# Patient Record
Sex: Male | Born: 1950
Health system: Southern US, Community
[De-identification: ages and names within clinical notes are randomized; demographics above are authoritative.]

## PROBLEM LIST (undated history)

## (undated) DIAGNOSIS — I739 Peripheral vascular disease, unspecified: Secondary | ICD-10-CM

## (undated) DIAGNOSIS — E785 Hyperlipidemia, unspecified: Secondary | ICD-10-CM

## (undated) DIAGNOSIS — E875 Hyperkalemia: Principal | ICD-10-CM

## (undated) DIAGNOSIS — Z1159 Encounter for screening for other viral diseases: Secondary | ICD-10-CM

## (undated) DIAGNOSIS — I10 Essential (primary) hypertension: Secondary | ICD-10-CM

## (undated) DIAGNOSIS — M109 Gout, unspecified: Secondary | ICD-10-CM

## (undated) DIAGNOSIS — I350 Nonrheumatic aortic (valve) stenosis: Secondary | ICD-10-CM

## (undated) DIAGNOSIS — M549 Dorsalgia, unspecified: Secondary | ICD-10-CM

## (undated) DIAGNOSIS — Z952 Presence of prosthetic heart valve: Secondary | ICD-10-CM

## (undated) DIAGNOSIS — I779 Disorder of arteries and arterioles, unspecified: Secondary | ICD-10-CM

## (undated) DIAGNOSIS — I5042 Chronic combined systolic (congestive) and diastolic (congestive) heart failure: Secondary | ICD-10-CM

## (undated) DIAGNOSIS — I4891 Unspecified atrial fibrillation: Secondary | ICD-10-CM

## (undated) DIAGNOSIS — I639 Cerebral infarction, unspecified: Secondary | ICD-10-CM

## (undated) DIAGNOSIS — J449 Chronic obstructive pulmonary disease, unspecified: Secondary | ICD-10-CM

## (undated) DIAGNOSIS — Z720 Tobacco use: Secondary | ICD-10-CM

## (undated) DIAGNOSIS — R011 Cardiac murmur, unspecified: Secondary | ICD-10-CM

## (undated) DIAGNOSIS — I2510 Atherosclerotic heart disease of native coronary artery without angina pectoris: Secondary | ICD-10-CM

## (undated) HISTORY — DX: Hyperkalemia: E87.5

## (undated) HISTORY — DX: Encounter for screening for other viral diseases: Z11.59

## (undated) HISTORY — DX: Presence of prosthetic heart valve: Z95.2

## (undated) HISTORY — DX: Peripheral vascular disease, unspecified: I73.9

## (undated) HISTORY — DX: Disorder of arteries and arterioles, unspecified: I77.9

## (undated) HISTORY — DX: Cerebral infarction, unspecified: I63.9

---

## 2002-12-04 ENCOUNTER — Encounter: Admission: RE | Admit: 2002-12-04 | Discharge: 2002-12-04 | Payer: Self-pay | Admitting: Orthopedic Surgery

## 2002-12-04 ENCOUNTER — Encounter: Payer: Self-pay | Admitting: Orthopedic Surgery

## 2002-12-04 ENCOUNTER — Encounter: Payer: Self-pay | Admitting: Radiology

## 2002-12-19 ENCOUNTER — Encounter: Payer: Self-pay | Admitting: Orthopedic Surgery

## 2002-12-19 ENCOUNTER — Encounter: Admission: RE | Admit: 2002-12-19 | Discharge: 2002-12-19 | Payer: Self-pay | Admitting: Orthopedic Surgery

## 2003-01-02 ENCOUNTER — Encounter: Payer: Self-pay | Admitting: Orthopedic Surgery

## 2003-01-02 ENCOUNTER — Encounter: Admission: RE | Admit: 2003-01-02 | Discharge: 2003-01-02 | Payer: Self-pay | Admitting: Orthopedic Surgery

## 2008-08-25 HISTORY — PX: NM MYOCAR PERF WALL MOTION: HXRAD629

## 2008-09-23 ENCOUNTER — Ambulatory Visit (HOSPITAL_COMMUNITY): Admission: RE | Admit: 2008-09-23 | Discharge: 2008-09-23 | Payer: Self-pay | Admitting: Cardiovascular Disease

## 2008-10-14 ENCOUNTER — Encounter: Admission: RE | Admit: 2008-10-14 | Discharge: 2008-10-14 | Payer: Self-pay | Admitting: Cardiovascular Disease

## 2008-10-20 ENCOUNTER — Ambulatory Visit (HOSPITAL_COMMUNITY): Admission: AD | Admit: 2008-10-20 | Discharge: 2008-10-21 | Payer: Self-pay | Admitting: Cardiovascular Disease

## 2008-10-20 HISTORY — PX: ILIAC ARTERY STENT: SHX1786

## 2008-12-07 ENCOUNTER — Ambulatory Visit: Payer: Self-pay | Admitting: Surgery

## 2008-12-18 ENCOUNTER — Ambulatory Visit: Payer: Self-pay | Admitting: Surgery

## 2008-12-18 ENCOUNTER — Inpatient Hospital Stay (HOSPITAL_COMMUNITY): Admission: RE | Admit: 2008-12-18 | Discharge: 2008-12-25 | Payer: Self-pay | Admitting: Surgery

## 2008-12-18 ENCOUNTER — Encounter: Payer: Self-pay | Admitting: Surgery

## 2008-12-18 HISTORY — PX: OTHER SURGICAL HISTORY: SHX169

## 2009-01-11 ENCOUNTER — Ambulatory Visit: Payer: Self-pay | Admitting: Surgery

## 2010-07-30 LAB — COMPREHENSIVE METABOLIC PANEL
ALT: 26 U/L (ref 0–53)
ALT: 40 U/L (ref 0–53)
ALT: 51 U/L (ref 0–53)
AST: 26 U/L (ref 0–37)
AST: 74 U/L — ABNORMAL HIGH (ref 0–37)
Albumin: 2.7 g/dL — ABNORMAL LOW (ref 3.5–5.2)
Alkaline Phosphatase: 40 U/L (ref 39–117)
CO2: 23 mEq/L (ref 19–32)
CO2: 30 mEq/L (ref 19–32)
Calcium: 7.7 mg/dL — ABNORMAL LOW (ref 8.4–10.5)
Calcium: 8.1 mg/dL — ABNORMAL LOW (ref 8.4–10.5)
Chloride: 107 mEq/L (ref 96–112)
GFR calc Af Amer: >60 mL/min (ref >60)
GFR calc Af Amer: >60 mL/min (ref >60)
GFR calc non Af Amer: >60 mL/min (ref >60)
GFR calc non Af Amer: >60 mL/min (ref >60)
Glucose, Bld: 99 mg/dL (ref 70–99)
Potassium: 4.2 mEq/L (ref 3.5–5.1)
Potassium: 4.3 mEq/L (ref 3.5–5.1)
Sodium: 135 mEq/L (ref 135–145)
Sodium: 137 mEq/L (ref 135–145)
Sodium: 139 mEq/L (ref 135–145)
Total Bilirubin: 0.9 mg/dL (ref 0.3–1.2)
Total Bilirubin: 1.1 mg/dL (ref 0.3–1.2)
Total Protein: 5.1 g/dL — ABNORMAL LOW (ref 6.0–8.3)

## 2010-07-30 LAB — BASIC METABOLIC PANEL
Calcium: 7.4 mg/dL — ABNORMAL LOW (ref 8.4–10.5)
Calcium: 7.5 mg/dL — ABNORMAL LOW (ref 8.4–10.5)
Calcium: 7.9 mg/dL — ABNORMAL LOW (ref 8.4–10.5)
Creatinine, Ser: 0.95 mg/dL (ref 0.4–1.5)
Creatinine, Ser: 1.46 mg/dL (ref 0.4–1.5)
GFR calc Af Amer: 60 mL/min — ABNORMAL LOW (ref >60)
GFR calc Af Amer: >60 mL/min (ref >60)
GFR calc Af Amer: >60 mL/min (ref >60)
GFR calc non Af Amer: 49 mL/min — ABNORMAL LOW (ref >60)
GFR calc non Af Amer: >60 mL/min (ref >60)
GFR calc non Af Amer: >60 mL/min (ref >60)
Glucose, Bld: 131 mg/dL — ABNORMAL HIGH (ref 70–99)
Sodium: 138 mEq/L (ref 135–145)
Sodium: 139 mEq/L (ref 135–145)

## 2010-07-30 LAB — POCT I-STAT 4, (NA,K, GLUC, HGB,HCT)
Glucose, Bld: 147 mg/dL — ABNORMAL HIGH (ref 70–99)
Hemoglobin: 12.2 g/dL — ABNORMAL LOW (ref 13.0–17.0)
Potassium: 4.5 mEq/L (ref 3.5–5.1)

## 2010-07-30 LAB — TYPE AND SCREEN
ABO/RH(D): O POS
Antibody Screen: NEGATIVE

## 2010-07-30 LAB — POCT I-STAT 3, ART BLOOD GAS (G3+)
Acid-base deficit: 2 mmol/L (ref 0.0–2.0)
Acid-base deficit: 3 mmol/L — ABNORMAL HIGH (ref 0.0–2.0)
Acid-base deficit: 4 mmol/L — ABNORMAL HIGH (ref 0.0–2.0)
Bicarbonate: 22.4 mEq/L (ref 20.0–24.0)
Bicarbonate: 26.5 mEq/L — ABNORMAL HIGH (ref 20.0–24.0)
O2 Saturation: 90 %
Patient temperature: 35.9
Patient temperature: 36.7
Patient temperature: 98
pCO2 arterial: 43.5 mmHg (ref 35.0–45.0)
pH, Arterial: 7.209 — ABNORMAL LOW (ref 7.350–7.450)
pH, Arterial: 7.326 — ABNORMAL LOW (ref 7.350–7.450)
pH, Arterial: 7.327 — ABNORMAL LOW (ref 7.350–7.450)
pO2, Arterial: 63 mmHg — ABNORMAL LOW (ref 80.0–100.0)
pO2, Arterial: 64 mmHg — ABNORMAL LOW (ref 80.0–100.0)
pO2, Arterial: 67 mmHg — ABNORMAL LOW (ref 80.0–100.0)

## 2010-07-30 LAB — URINALYSIS, ROUTINE W REFLEX MICROSCOPIC
Glucose, UA: NEGATIVE mg/dL
Ketones, ur: NEGATIVE mg/dL
Leukocytes, UA: NEGATIVE
Specific Gravity, Urine: 1.029 (ref 1.005–1.030)
pH: 5.5 (ref 5.0–8.0)

## 2010-07-30 LAB — CBC
Hemoglobin: 10.2 g/dL — ABNORMAL LOW (ref 13.0–17.0)
Hemoglobin: 10.7 g/dL — ABNORMAL LOW (ref 13.0–17.0)
Hemoglobin: 12.3 g/dL — ABNORMAL LOW (ref 13.0–17.0)
MCHC: 33.7 g/dL (ref 30.0–36.0)
MCHC: 33.9 g/dL (ref 30.0–36.0)
MCHC: 34.6 g/dL (ref 30.0–36.0)
RBC: 2.92 MIL/uL — ABNORMAL LOW (ref 4.22–5.81)
RBC: 3.12 MIL/uL — ABNORMAL LOW (ref 4.22–5.81)
RBC: 3.3 MIL/uL — ABNORMAL LOW (ref 4.22–5.81)
RBC: 3.85 MIL/uL — ABNORMAL LOW (ref 4.22–5.81)
RBC: 5 MIL/uL (ref 4.22–5.81)
RDW: 14.6 % (ref 11.5–15.5)
RDW: 15.1 % (ref 11.5–15.5)
WBC: 10.9 10*3/uL — ABNORMAL HIGH (ref 4.0–10.5)
WBC: 4.8 10*3/uL (ref 4.0–10.5)
WBC: 7.5 10*3/uL (ref 4.0–10.5)

## 2010-07-30 LAB — URINE MICROSCOPIC-ADD ON

## 2010-07-30 LAB — BLOOD GAS, ARTERIAL
Acid-base deficit: 0.9 mmol/L (ref 0.0–2.0)
Drawn by: 313941
FIO2: 0.21 %
pCO2 arterial: 37.3 mmHg (ref 35.0–45.0)
pO2, Arterial: 67.3 mmHg — ABNORMAL LOW (ref 80.0–100.0)

## 2010-07-30 LAB — APTT: aPTT: 31 seconds (ref 24–37)

## 2010-07-30 LAB — AMYLASE: Amylase: 124 U/L (ref 27–131)

## 2010-07-30 LAB — PROTIME-INR
INR: 1.1 (ref 0.00–1.49)
Prothrombin Time: 14.5 seconds (ref 11.6–15.2)

## 2010-07-30 LAB — GLUCOSE, CAPILLARY: Glucose-Capillary: 92 mg/dL (ref 70–99)

## 2010-07-30 LAB — MAGNESIUM: Magnesium: 1.6 mg/dL (ref 1.5–2.5)

## 2010-07-30 LAB — MRSA PCR SCREENING: MRSA by PCR: NEGATIVE

## 2010-08-01 LAB — CBC
Hemoglobin: 15.9 g/dL (ref 13.0–17.0)
MCHC: 34.2 g/dL (ref 30.0–36.0)
MCV: 93.3 fL (ref 78.0–100.0)
RBC: 4.97 MIL/uL (ref 4.22–5.81)
WBC: 7 10*3/uL (ref 4.0–10.5)

## 2010-08-01 LAB — BASIC METABOLIC PANEL
CO2: 30 mEq/L (ref 19–32)
Chloride: 98 mEq/L (ref 96–112)
GFR calc Af Amer: >60 mL/min (ref >60)
Sodium: 134 mEq/L — ABNORMAL LOW (ref 135–145)

## 2010-09-06 NOTE — H&P (Signed)
[name], [name] NO.:  [id number]   MEDICAL RECORD NO.:  [id number]          PATIENT TYPE:  INP   LOCATION:  2505                         FACILITY:  [facility]   PHYSICIAN:  Nanetta Batty, M.D.   DATE OF BIRTH:  11-Apr-1951   DATE OF ADMISSION:  10/20/2008  DATE OF DISCHARGE:                              HISTORY & PHYSICAL   CHIEF COMPLAINTS:  Hip discomfort, claudication equivalent.   HISTORY OF PRESENT ILLNESS:  A 60 year old divorced white male with  recent failed DOT physical secondary to loud murmur, was sent to Dr.  Allyson Sabal for cardiac workup, mild-to-moderate AS murmur.  Stress test was  negative.  He did have decreased ABIs of the lower extremities with 0.40  on the right and 0.49 on the left.  He underwent CT angiogram revealing  occluded right iliac and left iliac disease and aneurysmal disease of  the abdominal aorta.  Because of these findings and the patient's  complaints of hip discomfort and claudication, Dr. Allyson Sabal is having him  electively proceed with PV angiogram on October 20, 2008.   PAST MEDICAL HISTORY:  Hypertension treated and tobacco use 1 to 1-1/2  pack per day.   FAMILY HISTORY:  Father died at 41 with an MI.  One brother with bypass  grafting.  One brother with coronary disease and stents.   SOCIAL HISTORY:  Divorced.  He is a Naval architect.  He has 2 children.  He did have a 3rd, but that son died in a wreck.  The patient does smoke  1 to 1-1/2 packs per day.  Drinks 2-4 beers a day.   ALLERGIES:  No known allergies.   OUTPATIENT MEDICATIONS:  1. Toprol-XL 100 mg daily.  2. Norvasc 10 mg daily.  3. Lotensin HCT 20/12.5 daily and Lotensin 20 daily.  4. Simvastatin 20 mg at bedtime.   REVIEW OF SYSTEMS:  GENERAL:  No recent colds or fevers.  NEURO:  No  dizziness, lightheadedness, or syncope.  CARDIOVASCULAR:  No chest pain.  PULMONARY:  No shortness of breath.  GI:  No diarrhea, constipation, or  melena.  GU:  No hematuria,  dysuria.  MUSCULOSKELETAL:  Continues with  hip claudication, but otherwise negative.  ENDOCRINE:  No diabetes or  thyroid disease.   PHYSICAL EXAMINATION:  VITAL SIGNS:  Blood pressure 130/78, respiratory  rate is 18, pulse 72, temperature 98.5, oxygen saturation room air 93%.  GENERAL:  Alert, oriented white male, in no acute distress, pleasant  affect.  SKIN:  Warm and dry.  Brisk capillary refill.  HEENT:  Normocephalic.  Sclerae clear.  NECK:  Supple.  Bilateral carotid bruits.  Probable radiation of aortic  outflow murmur.  LUNGS:  Clear without rales, rhonchi, or wheezes; somewhat diminished  throughout.  HEART:  S1 and S2, 2/6 aortic outflow murmur.  No gallop is noted.  ABDOMEN:  Obese, soft, nontender.  Positive bowel sounds.  Did not  palpate liver, spleen, or masses.  LOWER EXTREMITIES:  I do not palpate pedal pulses.  The feet are equally  warm.  He does have varicosities.  Lower extremities,  no edema.  NEURO:  Alert and oriented x3.  Follows commands.  Moves all  extremities.   X-ray, Persantine Myoview was negative for ischemia, EF 61%.  2-D echo  revealed an old mild-to-moderate AS constant murmur.  EF was greater  than 55%.  Carotid, minimal 0-49% stenosis bilaterally.  ABIs on the  right 0.40, on the left 0.49.  EKG, sinus rhythm.  No acute changes.  Hemoglobin 17.5, hematocrit 52.9.  Sodium 139, potassium 4.7, and  creatinine 0.91, LDL 89.   IMPRESSION:  Significant peripheral vascular disease for PV angio and  probable stent; tobacco abuse, counseled today to stop.      Darcella Gasman. Annie Paras, N.P.      Nanetta Batty, M.D.  Electronically Signed    LRI/MEDQ  D:  10/20/2008  T:  10/20/2008  Job:  [id number]   cc:   Nanetta Batty, M.D.

## 2010-09-06 NOTE — Op Note (Signed)
NAMECAYNE, [name]              ACCOUNT NO.:  [id number]   MEDICAL RECORD NO.:  [id number]          PATIENT TYPE:  INP   LOCATION:  2014                         FACILITY:  [facility]   PHYSICIAN:  Juleen China IV, MDDATE OF BIRTH:  [birth year]   DATE OF PROCEDURE:  12/18/2008  DATE OF DISCHARGE:                               OPERATIVE REPORT   PREOPERATIVE DIAGNOSIS:  Bilateral claudication.   POSTOPERATIVE DIAGNOSIS:  Bilateral claudication.   PROCEDURE PERFORMED:  1. Aortobifemoral bypass graft using 16 x 8 bifurcated Dacron graft.  2. Resection of abdominal aortic aneurysm.  3. Reimplantation of inferior mesenteric artery.  4. Bilateral femoral endarterectomy.   SURGEON:  1. Charlena Cross, MD   ASSISTANT:  Wilmon Arms, PA   ANESTHESIA:  General.   SPECIMENS:  Femoral plaque.   BLOOD LOSS:  1 L.   FINDINGS:  Irregular moderate-sized abdominal aortic aneurysm was  resected.  An end-to-end anastomosis was created from the graft to the  infrarenal abdominal aorta.  The patient had palpable pulses  at the end  of the procedure.   INDICATIONS:  This is a 60 year old gentleman with severe claudication.  He has undergone an arteriogram which reveals small infrarenal abdominal  aortic aneurysm.  He has an occluded right iliac system and diffusely  diseased left iliac system which has been stented.  I had a long  conversation with the patient in the preoperative visit discussing our  options.  We have elected to proceed with open repair to address his  aneurysm as well as his occlusive disease.   PROCEDURE:  The patient was identified in the holding area and taken to  room 9.  He was placed supine on the table.  General anesthesia was  administered.  The patient was prepped and draped in standard sterile  fashion.  A time-out was called.  Antibiotics were given.  The inguinal  ligament was identified by bony landmarks.  Longitudinal incisions were  made in each  groin over the common femoral artery.  Cautery was used to  divide the subcutaneous tissue.  Femoral sheath was opened sharply.  The  common femoral artery was mobilized up to and under the inguinal  ligament bilaterally.  Crossing veins were divided between 2-0 silk  ties.  The proximal portion of the superficial femoral and profunda  femoral arteries were dissected free bilaterally.  Vessel loops was then  placed.  The groins were then packed with moist Ray-Tec.  Attention was  then turned towards the abdomen.  A midline incision was made from the  xiphoid to the pubis.  Cautery was used to divide the subcutaneous  tissue.  The midline abdominal fascia was identified and opened with  cautery.  The abdomen was then entered sharply, and the incision was  then opened throughout its length with cautery.  The abdomen was  inspected.  There was no gross pathology.  Liver, gallbladder, stomach,  intestines all were grossly normal.  The transverse colon was then  reflected cephalad.  The ligament of Treitz was taken down with Bovie  cautery.  The small  bowel was then mobilized over to the patient's right  and Omni-Tract retractor was used to aid with exposure.  The aorta was  then exposed.  The inferior mesenteric vein was divided between 2-0 silk  ties.  I proceeded with mobilizing the aorta up to the renal arteries.  Both renal arteries were identified, and the aorta was dissected free  circumferentially at the level of the renal arteries.  The patient did  have aneurysmal degeneration of his abdominal aorta with several  saccular aneurysms present.  I mobilized the inferior mesenteric artery,  and it was encircled with a red vessel loop.  I then got down to the  aortic bifurcation.  There was fair amount of scar tissue in this area  as well as inflammation.  I got under the crossing nerves on the left  side and then created a tunnel directly on top of the iliac artery down  to the left groin  and umbilical tape was then passed here.  Tunnel was  posterior to the ureter.  Next, I tried to get better exposure of the  right groin as there was a lot of inflammation at this area.  A branch  to the vena cava was torn creating a several millimeter defect in the  vena cava.  This was difficult to control because the vena cava was not  well exposed at this point.  I further dissected out the vena cava to  get better exposure.  I used sponge sticks to help with control and was  able to repair the defect in the vena cava with a 4-0 Prolene.  Once  this was addressed, I then created tunnel anterior to the right iliac  artery and posterior to the ureter down into the right groin and  umbilical tape was passed.  At this point in time, the patient was given  systemic heparinization.  After the heparin had circulated, a curved  aortic clamp was placed at the level of the renal arteries.  Angled  DeBakey clamp was placed at the aortic bifurcation.  The aorta was  opened longitudinally.  Lumbar arteries were ligated with suture  ligature.  I transected the aorta approximately and below the takeoff of  the inferior mesenteric artery.  I elected to oversow the aorta, this  was done with running 3-0 Prolene between felt strip.  I selected a 16 x  8 bifurcated Dacron graft, and end-to-end anastomosis was created  proximally using 3 mattressed interrupted back wall sutures, the felt  cuff was placed.  The aorta at the level of the renal arteries was  slight aneurysmal and therefore I elected to slide a 20-mm aortic cuff  over top of the anastomosis.  Once the anastomosis was completed, the  proximal clamp was released, the hemostasis was adequate.  The limbs of  the graft were then brought through the previously created tunnels.  The  right groin was performed first.  After flushing the graft, the common  femoral artery was occluded followed by the profunda superficial femoral  artery.  A #11 blade  was used to make an arteriotomy.  There was  extensive plaque on the posterior wall of the common femoral artery.  I  felt it would require endarterectomy.  A common femoral endarterectomy  was then performed using Nicholos Johns and Physicist, medical.  I made  sure the proximal portion of the profunda femoral was endarterectomized  as well as the superficial femoral artery.  There was no plaque that  needed to be tacked down.  There were no flaps.  Next, the limb of the  graft was beveled to fit the size of the arteriotomy and a running  anastomosis was created with 5-0 Prolene.  The artery was flushed in  antegrade and retrograde fashion.  The graft was also appropriately  flushed.  Clamps were then released first and the common femoral  followed by the profunda femoral and then superficial femoral artery.  Once right groin was completed, attention was turned towards the left  groin.  Similarly after occluding the common profunda and superficial  femoral artery, a #11 blade was used to make an arteriotomy which was  extended with Potts scissors.  Again, a significant posterior plaque was  identified.  An endarterectomy was also required in the left groin very  similar to the right groin.  Once this was completed, it was inspected  to make sure there were no flaps.  An end-to-side anastomosis was then  created with running 5-0 Prolene.  Prior to completion, the artery was  appropriately flushed.  Anastomosis was completed.  Clamps were released  on the common femoral, profunda femoral, and superficial femoral artery.  At this point, the patient was evaluated with Doppler, had excellent  flow through profunda and superficial femoral arteries bilaterally.  Groins were then copiously irrigated.  At this point in time, I gave 50  mg of protamine to reverse the heparin.  I went back and reinspected the  abdomen, it was hemostatic.  The abdomen was then irrigated.  There was  not enough aorta to  close over the graft.  Therefore, reapproximated the  retroperitoneum using a running 2-0 Vicryl.  The fascia was then closed  with a running #1 PDS, subcutaneous tissue was closed with running 3-0  Vicryl, and the skin was closed with 4-0 Vicryl.  Steri-Strips and  dressings were placed in the groin.  Femoral sheaths were reapproximated  with 2-0  Vicryl and the subcutaneous tissue and skin were closed in layers of 3-0  Vicryl.  Dermabond was placed in the groin.  The patient's feet were  then inspected, he had palpable pedal pulses.  He was taken to the  surgical intensive care unit, he remained intubated, he was in stable  condition.      [name], MD  Electronically Signed     VWB/MEDQ  D:  12/23/2008  T:  12/24/2008  Job:  [id number]

## 2010-09-06 NOTE — Assessment & Plan Note (Signed)
OFFICE VISIT   Sparks, Dustin G  DOB:  [birth year]                                       12/07/2008  [id number]#:[id number]   REASON FOR VISIT:  Claudication.   HISTORY:  This is a 60 year old gentleman that I am seeing at the  request of Dr. Allyson Sabal for evaluation and management of bilateral  claudication and small infrarenal abdominal aortic aneurysm.  The  patient states that he had been having problems in his legs with walking  for 7-8 years.  He most recently failed an employee health screening  secondary to an auscultated murmur.  He has been cleared from a cardiac  standpoint.  However, on further evaluation of his lower extremities,  with a CT angiogram, he was found to have a 3.5 cm infrarenal abdominal  aneurysm as well as extensive aortoiliac disease.  Arteriogram was  performed by Dr. Allyson Sabal, and patient was found to have an occluded right  iliac arterial system and a diffusely diseased left-sided system, the  left side was percutaneously stented.  The patient comes today to  discuss options for revascularization of his right leg.  He states that  he is not having any issues with his left leg, the right leg is limiting  for him.   REVIEW OF SYSTEMS:  GENERAL:  Negative for fevers, chills, weight gain,  weight loss.  CARDIAC:  Is positive for shortness of breath, positive for heart  murmur.  PULMONARY:  Negative.  GI:  Negative.  GU:  Negative.  VASCULAR:  Is positive for pain in her legs when walking.  NEURO:  Negative.  ORTHO:  Negative.  PSYCH:  Negative.  ENT:  Negative.  HEME:  Negative.   PAST MEDICAL HISTORY:  Hypertension, hypercholesterolemia,  gastroesophageal reflux disease.   PAST SURGICAL HISTORY:  Left knee surgery.   FAMILY HISTORY:  Positive for cardiovascular disease at an early age in  his brother and father.   SOCIAL HISTORY:  He is single with 2 children.  He currently smokes a  half a pack a day, this is down from 2  packs a day.  He drinks 2-4 beers  daily.   MEDICATIONS:  Include Toprol XL 100 mg per day, Norvasc 10 mg per day,  Lotensin/hydrochlorothiazide 20/12.5 in the morning, Lotensin 20 mg in  the morning, simvastatin 20 mg per day, Plavix 75 mg per day, Pepcid 20  mg per day and aspirin 325 mg per day.   ALLERGIES:  None.   PHYSICAL EXAMINATION:  Blood pressure is 194/117, pulse 97.  Generally,  he is well-appearing, in no acute distress.  HEENT:  He is  normocephalic, atraumatic.  Pupils equal.  Sclerae anicteric.  Neck:  Supple.  No JVD.  Left carotid bruit.  Cardiovascular:  Regular rate and  rhythm.  Pulmonary:  Lungs are clear bilaterally.  Abdomen:  Soft,  nontender.  Extremities:  Warm and well-perfused.  He has a palpable  left femoral pulse.  Pedal pulses are not palpable.  There is no  ulceration.  Neuro:  Cranial nerves II through XII are grossly intact.  Psych:  He is alert and oriented x3.  Skin:  Without rash.   Diagnostic studies:  I reviewed the patient's CT angiogram as well as  arteriogram.   The patient has carotid duplex performed at [facility] which reveals  0%  to 49% stenosis bilaterally.   ASSESSMENT/PLAN:  Small infrarenal aneurysm and bilateral claudication.   Plan:  I had an extensive conversation with the patient and his daughter  today.  We outlined three options, #1 would be continued observation  with medical management, #2 would be left-to-right femoral-femoral  bypass, and #3 would be an aortobifemoral bypass.  After a lengthy  discussion, the patient has elected to proceed with an aortobifemoral  bypass graft to simultaneously treat his claudication as well as his  infrarenal small aneurysm.  I discussed the risks and benefits of the  procedure with the patient, we discussed embolic problems to the legs as  well as the intestine.  We also discussed cardiopulmonary complications,  wound complications and the risk of death.  He understands all these  and  wishes to have this performed as soon as possible.  We have scheduled  him for an aortobifemoral bypass graft Friday, August 27th.  I have told  him to stop taking his Plavix 1 week prior.   [name], MD  Electronically Signed   VWB/MEDQ  D:  12/07/2008  T:  12/08/2008  Job:  [id number]   cc:   Nanetta Batty, M.D.

## 2010-09-06 NOTE — Assessment & Plan Note (Signed)
OFFICE VISIT   Sparks, Dustin G  DOB:  February 27, 1951                                       01/11/2009  [id number]#:[id number]   REASON FOR VISIT:  Followup.   HISTORY:  A 60 year old gentleman who underwent aortobifemoral bypass  graft with a 16 x 8 bifurcated Dacron graft.  Simultaneously he had  resection of a small infrarenal abdominal aortic aneurysm,  reimplantation of his inferior mesenteric artery, and bilateral femoral  endarterectomy.  The patient has recovered from surgery nicely.  His  incisions are well healed.  He is back tolerating a regular diet and is  off of narcotics.  His biggest complaint is some swelling of his right  leg.   Ultrasound studies were performed today which reveal ankle brachial  indices of greater than 1 bilaterally.   I have recommended that if his swelling persists or becomes  uncomfortable, that we would proceed in placing him in compression  stockings.  I told him this will most likely resolve over the next  several months.  He knows that keeping it elevated will also help it.  The patient will be referred back to Dr. Allyson Sabal to resume his vascular  care as well as surveillance screening of his aortobifemoral graft.  He  will contact me should there be any questions,.  Patient is cleared from  a surgical perspective.   [name], MD  Electronically Signed   VWB/MEDQ  D:  01/11/2009  T:  01/12/2009  Job:  [id number]   cc:   Nanetta Batty, M.D.

## 2010-09-06 NOTE — Cardiovascular Report (Signed)
[name], [name] NO.:  [id number]   MEDICAL RECORD NO.:  [id number]          PATIENT TYPE:  INP   LOCATION:  2505                         FACILITY:  [facility]   PHYSICIAN:  Nanetta Batty, M.D.   DATE OF BIRTH:  Jun 25, 1950   DATE OF PROCEDURE:  DATE OF DISCHARGE:                            CARDIAC CATHETERIZATION   PERIPHERAL ANGIOGRAM/PERCUTANEOUS TRANSLUMINAL ANGIOPLASTY AND STENT  REPORT.   [name] is a 60 year old mildly overweight divorced white male,  father of 2 living children, works as a Charity fundraiser.  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  His risk factor  profile is positive for 40-pack year history of tobacco abuse.  He is  still smoking 1 pack per day, as well as treated hypertension.  He has a  strong family history of heart disease with father who died of an MI at  age 42 and a brother that has a stent and another brother had bypass  surgery.  He has never had a heart attack or stroke.  He does complain  of hip and leg discomfort with claudication.  He had a Myoview, which  was negative.  He had a 2-D echo that showed normal LV function with  some mild aortic stenosis and the valve area of 1.2 sq cm.  Carotid  Dopplers were normal.  Lower extremity Dopplers suggested Leriche  syndrome.  He ultimately underwent CT angio, which revealed a 3.7-cm  abdominal aortic aneurysm with new thrombus, occluded right iliac with  high-grade left common external iliac artery stenosis.  There is no  infrainguinal disease.  I reviewed this with the radiologist.  He  presents now for angiography potential intervention for lifestyle-  limiting claudication.   PROCEDURE DESCRIPTION:  The patient was brought to the second floor at  [facility] in a postabsorptive state.  His left  groin was prepped and shaved in usual sterile fashion.  A 1% Xylocaine  was used for local anesthesia.  A 5-French short sheath was  inserted  into the left femoral artery using standard Seldinger technique.  Central access was obtained with a short 5-French end-hole and a  Glidewire.  Visipaque dye was used through the entirety of the case.  Retrograde aortic pressures monitored during the case.   ANGIOGRAPHIC RESULTS:  1. Abdominal aorta.      a.     There was fluoroscopic confirmation of a calcific abdominal       aortic aneurysm.      b.     A 30% bilateral renal artery stenosis.  2. Left lower extremity.      a.     A 30% ostial left common iliac artery stenosis with 70%       distal.      b.     A 70% proximal left external iliac artery stenosis.      c.     The proximal SFA and profunda were patent with delayed       imaging.  3. Right lower extremity.      a.  Total right common and external iliac artery.      b.     Right SFA films with delayed imaging.   PROCEDURE DESCRIPTION:  The patient has a moderate-sized infrarenal  abdominal aortic aneurysm, probably not large enough to require  aortobifemoral bypass grafting.  He does have severe claudication with  total right and high-grade left common and external iliac artery  stenosis.  We will proceed with PTA and stenting of his left common and  external iliac artery and then decide whether or not he is a candidate  for aortobifemoral or fem-fem crossover grafting.   The patient received 3000 units of heparin intravenously.  A 5-French  sheath was exchanged over a long Wholey wire for a 6-French long sheath.  The external iliac artery was dilated with a 5.3 balloon, and the common  and external iliac artery were stented with an 8 x 6 Smart nitinol  Cordis self-expanding stent.  The entire segment was then dilated with a  6 x 4 Powerflex at 2-4 atmospheres resulting in reduction of 70% distal  left common and proximal left external iliac artery stenoses and 0%  residual with excellent flow and no dissection.  The patient tolerated  the procedure well.   The SFA profunda bifurcation was also imaged and  was widely patent.  The ACT was measured less than 200.  Sheath was  removed, and pressure was held on the groin to achieve hemostasis.  He  will be gently hydrated overnight and discharged home in the morning on  aspirin, Plavix.  He was will obtain followup Dopplers and ABIs, after  which he will see me back.  He left the lab in stable condition.        Nanetta Batty, M.D.  Electronically Signed     JB/MEDQ  D:  10/20/2008  T:  10/21/2008  Job:  [id number]   cc:   Second Floor [facility]  [facility]  [facility]

## 2011-06-20 HISTORY — PX: US ECHOCARDIOGRAPHY: HXRAD669

## 2011-11-21 ENCOUNTER — Encounter: Payer: Self-pay | Admitting: Vascular Surgery

## 2012-09-16 ENCOUNTER — Encounter (HOSPITAL_COMMUNITY): Payer: Self-pay | Admitting: Adult Health

## 2012-09-16 ENCOUNTER — Emergency Department (HOSPITAL_COMMUNITY): Payer: Medicaid Other

## 2012-09-16 ENCOUNTER — Inpatient Hospital Stay (HOSPITAL_COMMUNITY)
Admission: EM | Admit: 2012-09-16 | Discharge: 2012-10-18 | DRG: 003 | Disposition: A | Discharge status: Skilled Nursing Facility | Payer: Medicaid Other | Attending: Cardiothoracic Surgery | Admitting: Cardiothoracic Surgery

## 2012-09-16 DIAGNOSIS — Z720 Tobacco use: Secondary | ICD-10-CM

## 2012-09-16 DIAGNOSIS — F17200 Nicotine dependence, unspecified, uncomplicated: Secondary | ICD-10-CM | POA: Diagnosis present

## 2012-09-16 DIAGNOSIS — J9600 Acute respiratory failure, unspecified whether with hypoxia or hypercapnia: Secondary | ICD-10-CM

## 2012-09-16 DIAGNOSIS — D62 Acute posthemorrhagic anemia: Secondary | ICD-10-CM | POA: Diagnosis not present

## 2012-09-16 DIAGNOSIS — E876 Hypokalemia: Secondary | ICD-10-CM | POA: Diagnosis not present

## 2012-09-16 DIAGNOSIS — J449 Chronic obstructive pulmonary disease, unspecified: Secondary | ICD-10-CM

## 2012-09-16 DIAGNOSIS — I10 Essential (primary) hypertension: Secondary | ICD-10-CM

## 2012-09-16 DIAGNOSIS — I4891 Unspecified atrial fibrillation: Secondary | ICD-10-CM

## 2012-09-16 DIAGNOSIS — I255 Ischemic cardiomyopathy: Secondary | ICD-10-CM

## 2012-09-16 DIAGNOSIS — I420 Dilated cardiomyopathy: Secondary | ICD-10-CM

## 2012-09-16 DIAGNOSIS — I359 Nonrheumatic aortic valve disorder, unspecified: Secondary | ICD-10-CM | POA: Diagnosis present

## 2012-09-16 DIAGNOSIS — I509 Heart failure, unspecified: Secondary | ICD-10-CM

## 2012-09-16 DIAGNOSIS — I2589 Other forms of chronic ischemic heart disease: Secondary | ICD-10-CM | POA: Diagnosis present

## 2012-09-16 DIAGNOSIS — M25569 Pain in unspecified knee: Secondary | ICD-10-CM | POA: Diagnosis present

## 2012-09-16 DIAGNOSIS — J439 Emphysema, unspecified: Secondary | ICD-10-CM | POA: Diagnosis present

## 2012-09-16 DIAGNOSIS — J4489 Other specified chronic obstructive pulmonary disease: Secondary | ICD-10-CM | POA: Diagnosis present

## 2012-09-16 DIAGNOSIS — Z91199 Patient's noncompliance with other medical treatment and regimen due to unspecified reason: Secondary | ICD-10-CM

## 2012-09-16 DIAGNOSIS — Z7982 Long term (current) use of aspirin: Secondary | ICD-10-CM

## 2012-09-16 DIAGNOSIS — Z952 Presence of prosthetic heart valve: Secondary | ICD-10-CM

## 2012-09-16 DIAGNOSIS — F1020 Alcohol dependence, uncomplicated: Secondary | ICD-10-CM | POA: Diagnosis present

## 2012-09-16 DIAGNOSIS — L0390 Cellulitis, unspecified: Secondary | ICD-10-CM

## 2012-09-16 DIAGNOSIS — I350 Nonrheumatic aortic (valve) stenosis: Secondary | ICD-10-CM

## 2012-09-16 DIAGNOSIS — Z7901 Long term (current) use of anticoagulants: Secondary | ICD-10-CM

## 2012-09-16 DIAGNOSIS — E785 Hyperlipidemia, unspecified: Secondary | ICD-10-CM

## 2012-09-16 DIAGNOSIS — L02419 Cutaneous abscess of limb, unspecified: Secondary | ICD-10-CM | POA: Diagnosis present

## 2012-09-16 DIAGNOSIS — D696 Thrombocytopenia, unspecified: Secondary | ICD-10-CM | POA: Diagnosis present

## 2012-09-16 DIAGNOSIS — I5042 Chronic combined systolic (congestive) and diastolic (congestive) heart failure: Secondary | ICD-10-CM

## 2012-09-16 DIAGNOSIS — I2510 Atherosclerotic heart disease of native coronary artery without angina pectoris: Principal | ICD-10-CM

## 2012-09-16 DIAGNOSIS — Z951 Presence of aortocoronary bypass graft: Secondary | ICD-10-CM

## 2012-09-16 DIAGNOSIS — Z8249 Family history of ischemic heart disease and other diseases of the circulatory system: Secondary | ICD-10-CM

## 2012-09-16 DIAGNOSIS — I5041 Acute combined systolic (congestive) and diastolic (congestive) heart failure: Secondary | ICD-10-CM

## 2012-09-16 DIAGNOSIS — M109 Gout, unspecified: Secondary | ICD-10-CM

## 2012-09-16 DIAGNOSIS — I739 Peripheral vascular disease, unspecified: Secondary | ICD-10-CM

## 2012-09-16 DIAGNOSIS — I252 Old myocardial infarction: Secondary | ICD-10-CM

## 2012-09-16 DIAGNOSIS — I480 Paroxysmal atrial fibrillation: Secondary | ICD-10-CM

## 2012-09-16 DIAGNOSIS — Z01811 Encounter for preprocedural respiratory examination: Secondary | ICD-10-CM

## 2012-09-16 DIAGNOSIS — I079 Rheumatic tricuspid valve disease, unspecified: Secondary | ICD-10-CM | POA: Diagnosis present

## 2012-09-16 DIAGNOSIS — Z9119 Patient's noncompliance with other medical treatment and regimen: Secondary | ICD-10-CM

## 2012-09-16 DIAGNOSIS — R600 Localized edema: Secondary | ICD-10-CM

## 2012-09-16 HISTORY — DX: Unspecified atrial fibrillation: I48.91

## 2012-09-16 HISTORY — DX: Chronic combined systolic (congestive) and diastolic (congestive) heart failure: I50.42

## 2012-09-16 HISTORY — DX: Peripheral vascular disease, unspecified: I73.9

## 2012-09-16 HISTORY — DX: Gout, unspecified: M10.9

## 2012-09-16 HISTORY — DX: Cardiac murmur, unspecified: R01.1

## 2012-09-16 HISTORY — DX: Hyperlipidemia, unspecified: E78.5

## 2012-09-16 HISTORY — DX: Tobacco use: Z72.0

## 2012-09-16 HISTORY — DX: Essential (primary) hypertension: I10

## 2012-09-16 HISTORY — DX: Nonrheumatic aortic (valve) stenosis: I35.0

## 2012-09-16 HISTORY — DX: Atherosclerotic heart disease of native coronary artery without angina pectoris: I25.10

## 2012-09-16 LAB — CBC
HCT: 51.6 % (ref 39.0–52.0)
MCH: 31.3 pg (ref 26.0–34.0)
MCV: 91.3 fL (ref 78.0–100.0)
Platelets: 191 10*3/uL (ref 150–400)
RBC: 5.65 MIL/uL (ref 4.22–5.81)
RDW: 14.4 % (ref 11.5–15.5)

## 2012-09-16 LAB — HEPATIC FUNCTION PANEL
ALT: 18 U/L (ref 0–53)
Alkaline Phosphatase: 67 U/L (ref 39–117)
Bilirubin, Direct: 0.3 mg/dL (ref 0.0–0.3)
Indirect Bilirubin: 0.4 mg/dL (ref 0.3–0.9)

## 2012-09-16 LAB — URINALYSIS, ROUTINE W REFLEX MICROSCOPIC
Bilirubin Urine: NEGATIVE
Glucose, UA: NEGATIVE mg/dL
Protein, ur: 100 mg/dL — AB
Specific Gravity, Urine: 1.013 (ref 1.005–1.030)
Urobilinogen, UA: 1 mg/dL (ref 0.0–1.0)

## 2012-09-16 LAB — POCT I-STAT TROPONIN I: Troponin i, poc: 0.09 ng/mL (ref 0.00–0.08)

## 2012-09-16 LAB — BLOOD GAS, ARTERIAL
Bicarbonate: 23.7 mEq/L (ref 20.0–24.0)
Drawn by: 28340
FIO2: 0.28 %
Patient temperature: 98.6
pH, Arterial: 7.329 — ABNORMAL LOW (ref 7.350–7.450)

## 2012-09-16 LAB — BASIC METABOLIC PANEL
BUN: 37 mg/dL — ABNORMAL HIGH (ref 6–23)
CO2: 19 mEq/L (ref 19–32)
Calcium: 9.1 mg/dL (ref 8.4–10.5)
Creatinine, Ser: 1.25 mg/dL (ref 0.50–1.35)

## 2012-09-16 LAB — URINE MICROSCOPIC-ADD ON

## 2012-09-16 LAB — PRO B NATRIURETIC PEPTIDE: Pro B Natriuretic peptide (BNP): 32487 pg/mL — ABNORMAL HIGH (ref 0–125)

## 2012-09-16 MED ORDER — HEPARIN BOLUS VIA INFUSION
4700.0000 [IU] | Freq: Once | INTRAVENOUS | Status: AC
  Administered 2012-09-16: 4700 [IU] via INTRAVENOUS

## 2012-09-16 MED ORDER — ASPIRIN 81 MG PO CHEW: 324.0000 mg | CHEWABLE_TABLET | ORAL | Status: DC

## 2012-09-16 MED ORDER — LORAZEPAM 0.5 MG PO TABS
0.5000 mg | ORAL_TABLET | ORAL | Status: DC | PRN
  Administered 2012-09-22 – 2012-09-26 (×5): 0.5 mg via ORAL
  Filled 2012-09-16 (×5): qty 1

## 2012-09-16 MED ORDER — ASPIRIN 81 MG PO CHEW
324.0000 mg | CHEWABLE_TABLET | Freq: Once | ORAL | Status: AC
  Administered 2012-09-16: 324 mg via ORAL
  Filled 2012-09-16: qty 4

## 2012-09-16 MED ORDER — NITROGLYCERIN IN D5W 200-5 MCG/ML-% IV SOLN: 5.0000 ug/min | INTRAVENOUS | Status: DC

## 2012-09-16 MED ORDER — ONDANSETRON HCL 4 MG/2ML IJ SOLN: 4.0000 mg | Freq: Four times a day (QID) | INTRAMUSCULAR | Status: DC | PRN

## 2012-09-16 MED ORDER — ASPIRIN EC 81 MG PO TBEC
81.0000 mg | DELAYED_RELEASE_TABLET | Freq: Every day | ORAL | Status: DC
  Administered 2012-09-17 – 2012-10-07 (×20): 81 mg via ORAL
  Filled 2012-09-16 (×22): qty 1

## 2012-09-16 MED ORDER — FUROSEMIDE 10 MG/ML IJ SOLN
40.0000 mg | Freq: Once | INTRAMUSCULAR | Status: AC
  Administered 2012-09-16: 40 mg via INTRAVENOUS
  Filled 2012-09-16: qty 4

## 2012-09-16 MED ORDER — ASPIRIN 325 MG PO TABS: 325.0000 mg | ORAL_TABLET | Freq: Every day | ORAL | Status: DC

## 2012-09-16 MED ORDER — METOPROLOL TARTRATE 12.5 MG HALF TABLET
12.5000 mg | ORAL_TABLET | Freq: Two times a day (BID) | ORAL | Status: DC
  Administered 2012-09-16 – 2012-09-17 (×3): 12.5 mg via ORAL
  Filled 2012-09-16 (×5): qty 1

## 2012-09-16 MED ORDER — HEPARIN (PORCINE) IN NACL 100-0.45 UNIT/ML-% IJ SOLN
1400.0000 [IU]/h | INTRAMUSCULAR | Status: DC
  Administered 2012-09-16 – 2012-09-19 (×4): 1300 [IU]/h via INTRAVENOUS
  Filled 2012-09-16 (×7): qty 250

## 2012-09-16 MED ORDER — SODIUM CHLORIDE 0.9 % IV SOLN
INTRAVENOUS | Status: DC
  Administered 2012-09-17: 20 mL/h via INTRAVENOUS
  Administered 2012-09-19: 08:00:00 via INTRAVENOUS

## 2012-09-16 MED ORDER — ASPIRIN 300 MG RE SUPP
300.0000 mg | RECTAL | Status: DC
  Filled 2012-09-16: qty 1

## 2012-09-16 MED ORDER — NITROGLYCERIN IN D5W 200-5 MCG/ML-% IV SOLN
2.0000 ug/min | Freq: Once | INTRAVENOUS | Status: AC
  Administered 2012-09-16: 5 ug/min via INTRAVENOUS
  Filled 2012-09-16: qty 250

## 2012-09-16 MED ORDER — PANTOPRAZOLE SODIUM 40 MG PO TBEC
40.0000 mg | DELAYED_RELEASE_TABLET | Freq: Every day | ORAL | Status: DC
  Administered 2012-09-17 – 2012-09-24 (×8): 40 mg via ORAL
  Filled 2012-09-16 (×8): qty 1

## 2012-09-16 MED ORDER — FUROSEMIDE 10 MG/ML IJ SOLN
80.0000 mg | Freq: Three times a day (TID) | INTRAMUSCULAR | Status: DC
  Administered 2012-09-16 – 2012-09-18 (×5): 80 mg via INTRAVENOUS
  Filled 2012-09-16 (×8): qty 8

## 2012-09-16 MED ORDER — NITROGLYCERIN 0.4 MG SL SUBL: 0.4000 mg | SUBLINGUAL_TABLET | SUBLINGUAL | Status: DC | PRN

## 2012-09-16 MED ORDER — ACETAMINOPHEN 325 MG PO TABS: 650.0000 mg | ORAL_TABLET | ORAL | Status: DC | PRN

## 2012-09-16 MED ORDER — DILTIAZEM HCL 100 MG IV SOLR
5.0000 mg/h | INTRAVENOUS | Status: DC
  Administered 2012-09-16 – 2012-09-17 (×2): 5 mg/h via INTRAVENOUS

## 2012-09-16 MED ORDER — ATORVASTATIN CALCIUM 40 MG PO TABS
40.0000 mg | ORAL_TABLET | Freq: Every day | ORAL | Status: DC
  Administered 2012-09-17 – 2012-10-07 (×20): 40 mg via ORAL
  Filled 2012-09-16 (×22): qty 1

## 2012-09-16 MED ORDER — DILTIAZEM HCL 25 MG/5ML IV SOLN
10.0000 mg | Freq: Once | INTRAVENOUS | Status: AC
  Administered 2012-09-16: 10 mg via INTRAVENOUS

## 2012-09-16 NOTE — ED Notes (Signed)
Presents with SOB, bilateral leg swelling and palpitations for over one week. Pt has been out of home HTN medication for over one month. Using abdominal muscles to breathe, able to speak in short phrases, bilateral lung sounds diminished with finr crackles, sats 92% RA, HR 150s. +2 pitting edema noted.

## 2012-09-16 NOTE — ED Notes (Signed)
Dr. Kelly at the bedside.  

## 2012-09-16 NOTE — ED Notes (Signed)
Dr. Rancour back at the bedside.  

## 2012-09-16 NOTE — H&P (Signed)
[facility]    [name] is an 62 y.o. male.    Primary Cardiologist :Runell Gess, MD  Chief Complaint: SOB, edema of both legs, out of meds for over 1 month HPI: 63 year old WM with progressively worsening shortness of breath, leg swelling and palpitations for the past week. States he is out of his medications for the past month secondary to not be able to afford them. Denies any chest pain. States he has a history of CHF and atrial fibrillation. He arrived in moderate respiratory distress with abdominal breathing and crackles throughout. A. fib with RVR in the 150s. Denies any chest pain, cough or fever. good by mouth intake. States he has not seen his cardiologist Dr.Berry in more than a year.  He was tachypneic with pursed lip breathing with rales.  Bipap applied.  IV Lasix given.  EKG with A fib with RVR has been given cardiazem as well.   History of  Moderate  AS on echo 05/2011 and normal LV function.  Also concentric LVH.  He has atrial fib and on last visit with Dr. Allyson Sabal he was in atrial fib. Dr. Allyson Sabal had placed Lt iliac stent but pt then had aorto-bifemoral bypass grafting by Dr. Myra Gianotti.  Negative nuc study in 2010.  Hx of ETOH withdrawal in past.     Past Medical History  Diagnosis Date  . Hypertension   . Gout   . Aortic stenosis     echo 06/19/08 with nomal LV function, moderate concentric LVH moderate aortic stenosis area 0.99 cm squared, peak gradient of 50 and mean of 31  . Atrial fibrillation   . HTN (hypertension)   . Hyperlipidemia   . Atrial fibrillation with RVR, was in atrial fib in 04/2011 09/16/2012  . Tobacco use 09/16/2012    Past Surgical History  Procedure Laterality Date  . Iliac artery stent  10/20/08    stent to lt iliac  . Aorto bifem bypass  12/18/08    by Dr. Myra Gianotti    Family History  Problem Relation Age of Onset  . Heart attack Father     died at 60 with MI  . Heart disease Brother    Social  History:  reports that he has been smoking Cigarettes.  He has been smoking about 0.00 packs per day. He does not have any smokeless tobacco history on file. He reports that  drinks alcohol. He reports that he does not use illicit drugs.  Allergies: No Known Allergies  OUTPATIENT MEDICATIONS: none  Results for orders placed during the hospital encounter of 09/16/12 (from the past 48 hour(s))  CBC     Status: Abnormal   Collection Time    09/16/12  5:12 PM      Result Value Range   WBC 6.7  4.0 - 10.5 K/uL   RBC 5.65  4.22 - 5.81 MIL/uL   Hemoglobin 17.7 (*) 13.0 - 17.0 g/dL   HCT 28.4  13.2 - 44.0 %   MCV 91.3  78.0 - 100.0 fL   MCH 31.3  26.0 - 34.0 pg   MCHC 34.3  30.0 - 36.0 g/dL   RDW 10.2  72.5 - 36.6 %   Platelets 191  150 - 400 K/uL  BASIC METABOLIC PANEL     Status: Abnormal   Collection Time    09/16/12  5:12 PM      Result Value Range   Sodium 133 (*) 135 - 145 mEq/L  Potassium 5.0  3.5 - 5.1 mEq/L   Chloride 97  96 - 112 mEq/L   CO2 19  19 - 32 mEq/L   Glucose, Bld 98  70 - 99 mg/dL   BUN 37 (*) 6 - 23 mg/dL   Creatinine, Ser 1.61  0.50 - 1.35 mg/dL   Calcium 9.1  8.4 - 09.6 mg/dL   GFR calc non Af Amer 60 (*) >90 mL/min   GFR calc Af Amer 69 (*) >90 mL/min   Comment:            The eGFR has been calculated     using the CKD EPI equation.     This calculation has not been     validated in all clinical     situations.     eGFR's persistently     <90 mL/min signify     possible Chronic Kidney Disease.  PRO B NATRIURETIC PEPTIDE     Status: Abnormal   Collection Time    09/16/12  5:12 PM      Result Value Range   Pro B Natriuretic peptide (BNP) 32487.0 (*) 0 - 125 pg/mL  PROTIME-INR     Status: None   Collection Time    09/16/12  5:12 PM      Result Value Range   Prothrombin Time 13.6  11.6 - 15.2 seconds   INR 1.05  0.00 - 1.49  POCT I-STAT TROPONIN I     Status: Abnormal   Collection Time    09/16/12  5:40 PM      Result Value Range   Troponin i,  poc 0.09 (*) 0.00 - 0.08 ng/mL   Comment NOTIFIED PHYSICIAN     Comment 3            Comment: Due to the release kinetics of cTnI,     a negative result within the first hours     of the onset of symptoms does not rule out     myocardial infarction with certainty.     If myocardial infarction is still suspected,     repeat the test at appropriate intervals.   Dg Chest Port 1 View  09/16/2012   *RADIOLOGY REPORT*  Clinical Data: Shortness of breath, bilateral leg swelling  PORTABLE CHEST - 1 VIEW  Comparison: 12/19/2008  Findings: Cardiomegaly with mild interstitial edema and suspected small left pleural effusion.  Associated bibasilar opacities, likely atelectasis.  No pneumothorax.  IMPRESSION: Cardiomegaly with mild interstitial edema and suspected small left pleural effusion.   Original Report Authenticated By: Charline Bills, M.D.    ROS: General:no colds or fevers, 40 pound weight gain over last few weeks Skin: legs swollen and red, small blisters developed over the last week HEENT:no blurred vision, no congestion CV:see HPI PUL:see HPI GI:no diarrhea constipation or melena, no indigestion GU:no hematuria, no dysuria MS:no joint pain, no claudication Neuro:no syncope, no lightheadedness Endo:no diabetes, no thyroid disease   Blood pressure 106/77, pulse 43, temperature 98.1 F (36.7 C), temperature source Oral, resp. rate 21, height 5\' 10"  (1.778 m), weight 228 lb (103.42 kg), SpO2 86.00%. PE: General:Pleasant affect,  In respiratory distress,tachypnea Skin:Warm and dry, brisk capillary refill HEENT:normocephalic, sclera clear, mucus membranes moist Neck: supple, + JVD, no bruits  Heart: Irreg irreg with murmur, no gallup, rub or click Lungs:with rales, bilaterally at least halfway up rales, rhonchi, or wheezes Abd: enlarged non tender, + BS,  Ext:3+ lower ext edema bil to thighs, unable to palpate pedal pulses  due to edema, both legs erythematous, small fluid blisters, 2+  radial pulses Neuro:alert and oriented, MAE, follows commands, + facial symmetry    Assessment/Plan Principal Problem:   Acute respiratory failure Active Problems:   Atrial fibrillation with RVR, was in atrial fib in 04/2011   HTN (hypertension)   Hyperlipidemia   Noncompliance   Tobacco use   Acute CHF, previous normal LV function and moderate AS- re-evaluate   Bilateral lower extremity edema, secondary to acute CHF  PLAN: Continue Bipap for now.  IV diuretics.  IV heparin.  Admit to CCU for close management.  Serial cardiac enzymes.  See Dr. Landry Dyke not for further details of plan.     [facility] Nurse Practitioner Certified [facility] Pager [phone] 09/16/2012, 7:29 PM     Patient seen and examined. Agree with assessment and plan. Pt is a 61 yo WM with h/o PVD s/p iliac stenting and AOBFB. He has a h/o htn AF, hyperlipidemia, current tobacco abuse and in past was found to have AS. He stopped all his medicines over 2 months ago when they ran out. He has not seen Dr. Dorma Russell for at least 11/2 - 2 yrs. Over the past 2 months he admits to progressive edema, sob, weight gain of 40 lbs and presents tonight with CHF, AF with RVR, 3-4+ edema to thighs bilaterally. He is on BiPAP in ER, and has been given IV diuresis, as well as IV Cardizem.  Will admit to CCU. Plan cardiac enzymes, echo to asses LV function and AS. Will heparinize.Once stable may need R and L heart cath.   Lennette Bihari, MD, [facility] 09/16/2012 7:31 PM

## 2012-09-16 NOTE — Progress Notes (Signed)
ANTICOAGULATION CONSULT NOTE - Initial Consult  Pharmacy Consult for Heparin  Indication: chest pain/ACS and atrial fibrillation  No Known Allergies  Patient Measurements: Height: 5\' 10"  (177.8 cm) Weight: 228 lb (103.42 kg) IBW/kg (Calculated) : 73 Heparin Dosing Weight: 95 kg  Vital Signs: Temp: 98.1 F (36.7 C) (05/26 1642) Temp src: Oral (05/26 1642) BP: 107/73 mmHg (05/26 1945) Pulse Rate: 43 (05/26 1845)  Labs:  Recent Labs  09/16/12 1712  HGB 17.7*  HCT 51.6  PLT 191  LABPROT 13.6  INR 1.05  CREATININE 1.25    Estimated Creatinine Clearance: 72.9 ml/min (by C-G formula based on Cr of 1.25).   Medical History: Past Medical History  Diagnosis Date  . Hypertension   . Gout   . Aortic stenosis     echo 06/19/08 with nomal LV function, moderate concentric LVH moderate aortic stenosis area 0.99 cm squared, peak gradient of 50 and mean of 31  . Atrial fibrillation   . HTN (hypertension)   . Hyperlipidemia   . Atrial fibrillation with RVR, was in atrial fib in 04/2011 09/16/2012  . Tobacco use 09/16/2012    Medications:  No oral AC  Assessment: 62 y/o M with progressively worsening SOB, bilateral LE edema, and palpitations. In Afib with RVR, possibly in Afib for a year. Trop 0.09, BNP 32487, Na 133, CBC good, Scr 1.25 with CrCl ~ 73, INR 1.05. No bleeding reported per patient.   Goal of Therapy:  Heparin level 0.3-0.7 units/ml Monitor platelets by anticoagulation protocol: Yes   Plan:  -Heparin 4700 units BOLUS x 1 -Start heparin infusion at 1300 units/hr -6 hour HL at 0200 -Daily CBC/HL -Monitor for bleeding  Abran Duke, PharmD Clinical Pharmacist Phone: [phone] Pager: [phone] 09/16/2012 8:13 PM

## 2012-09-16 NOTE — Progress Notes (Signed)
Please note pt believes he has been in atrial fib for over 1 year no anticoagulation.

## 2012-09-16 NOTE — ED Notes (Signed)
Dr. Rancour at the bedside.  

## 2012-09-16 NOTE — ED Provider Notes (Signed)
History     CSN: [id number]  Arrival date & time 09/16/12  1624   First MD Initiated Contact with Patient 09/16/12 1703      Chief Complaint  Patient presents with  . Shortness of Breath    (Consider location/radiation/quality/duration/timing/severity/associated sxs/prior treatment) HPI Comments: Patient with progressively worsening shortness of breath, leg swelling and palpitations for the past week. States he is out of his medications for the past month secondary to not be able to afford them. Denies any chest pain. States he has a history of CHF atrial fibrillation. He arrives in moderate respiratory distress with abdominal breathing and crackles throughout. A. fib with RVR in the 150s. Denies any chest pain, cough or fever. good by mouth intake. States he has not seen his cardiologist Dr.berry in more than a year.  The history is provided by the patient and a relative.    Past Medical History  Diagnosis Date  . Hypertension   . Gout   . Aortic stenosis     echo 06/19/08 with nomal LV function, moderate concentric LVH moderate aortic stenosis area 0.99 cm squared, peak gradient of 50 and mean of 31  . Atrial fibrillation   . HTN (hypertension)   . Hyperlipidemia   . Atrial fibrillation with RVR, was in atrial fib in 04/2011 09/16/2012  . Tobacco use 09/16/2012  . Heart murmur     Past Surgical History  Procedure Laterality Date  . Iliac artery stent  10/20/08    stent to lt iliac  . Aorto bifem bypass  12/18/08    by Dr. Myra Gianotti    Family History  Problem Relation Age of Onset  . Heart attack Father     died at 83 with MI  . Heart disease Brother     History  Substance Use Topics  . Smoking status: Current Every Day Smoker -- 1.00 packs/day    Types: Cigarettes  . Smokeless tobacco: Never Used  . Alcohol Use: 16.8 oz/week    28 Cans of beer per week      Review of Systems  Constitutional: Negative for fever, activity change and appetite change.  HENT:  Negative for congestion and rhinorrhea.   Respiratory: Positive for cough and shortness of breath.   Cardiovascular: Positive for palpitations and leg swelling. Negative for chest pain.  Gastrointestinal: Negative for nausea, vomiting and abdominal pain.  Genitourinary: Negative for dysuria, hematuria and decreased urine volume.  Musculoskeletal: Positive for joint swelling. Negative for back pain.  Skin: Negative for rash.  Neurological: Negative for dizziness, weakness, light-headedness and headaches.  A complete 10 system review of systems was obtained and all systems are negative except as noted in the HPI and PMH.    Allergies  Review of patient's allergies indicates no known allergies.  Home Medications  No current outpatient prescriptions on file.  BP 112/94  Pulse 101  Temp(Src) 98.1 F (36.7 C) (Oral)  Resp 26  Ht 5\' 10"  (1.778 m)  Wt 228 lb (103.42 kg)  BMI 32.71 kg/m2  SpO2 94%  Physical Exam  Constitutional: He is oriented to person, place, and time. He appears well-developed and well-nourished. He appears distressed.  Tachypneic, pursed lip breathing  HENT:  Head: Normocephalic.  Mouth/Throat: Oropharynx is clear and moist. No oropharyngeal exudate.  Eyes: EOM are normal. Pupils are equal, round, and reactive to light.  Neck: Normal range of motion. Neck supple. JVD present.  Cardiovascular: Normal rate, regular rhythm and normal heart sounds.   Pulmonary/Chest:  He is in respiratory distress. He has rales.  Rales halfway up bilaterally  Abdominal: Soft. He exhibits distension. There is no tenderness. There is no rebound and no guarding.  Musculoskeletal: Normal range of motion. He exhibits edema and tenderness.  +3 pitting edema to thighs  Neurological: He is alert and oriented to person, place, and time. No cranial nerve deficit. He exhibits normal muscle tone. Coordination normal.    ED Course  Procedures (including critical care time)  Labs Reviewed   CBC - Abnormal; Notable for the following:    Hemoglobin 17.7 (*)    All other components within normal limits  BASIC METABOLIC PANEL - Abnormal; Notable for the following:    Sodium 133 (*)    BUN 37 (*)    GFR calc non Af Amer 60 (*)    GFR calc Af Amer 69 (*)    All other components within normal limits  PRO B NATRIURETIC PEPTIDE - Abnormal; Notable for the following:    Pro B Natriuretic peptide (BNP) 32487.0 (*)    All other components within normal limits  HEPATIC FUNCTION PANEL - Abnormal; Notable for the following:    Albumin 2.5 (*)    All other components within normal limits  URINALYSIS, ROUTINE W REFLEX MICROSCOPIC - Abnormal; Notable for the following:    Hgb urine dipstick SMALL (*)    Protein, ur 100 (*)    All other components within normal limits  URINE MICROSCOPIC-ADD ON - Abnormal; Notable for the following:    Casts HYALINE CASTS (*)    All other components within normal limits  POCT I-STAT TROPONIN I - Abnormal; Notable for the following:    Troponin i, poc 0.09 (*)    All other components within normal limits  MRSA PCR SCREENING  PROTIME-INR  POTASSIUM  BLOOD GAS, ARTERIAL  TROPONIN I  TROPONIN I  TROPONIN I  PROTIME-INR  APTT  TSH  T4, FREE  MAGNESIUM  HEMOGLOBIN A1C  LIPID PANEL  CBC  BASIC METABOLIC PANEL  PRO B NATRIURETIC PEPTIDE   Dg Chest Port 1 View  09/16/2012   *RADIOLOGY REPORT*  Clinical Data: Shortness of breath, bilateral leg swelling  PORTABLE CHEST - 1 VIEW  Comparison: 12/19/2008  Findings: Cardiomegaly with mild interstitial edema and suspected small left pleural effusion.  Associated bibasilar opacities, likely atelectasis.  No pneumothorax.  IMPRESSION: Cardiomegaly with mild interstitial edema and suspected small left pleural effusion.   Original Report Authenticated By: Charline Bills, M.D.     1. CHF (congestive heart failure)   2. Atrial fibrillation with RVR       MDM  One week of progressively worsening leg  swelling, shortness of breath and orthopnea.. Out of medications for the past one month. Respiratory distress on arrival with rest.  Patient placed on BiPAP on arrival, IV Cardizem started for rate control. IV nitroglycerin and Lasix given for volume overload.  Responded to IV lasix.  Continue IV NTG, heparin.  D/w Dr. Tresa Endo, who will admit to CCU.   Date: 09/16/2012  Rate: 146  Rhythm: atrial fibrillation  QRS Axis: right  Intervals: normal  ST/T Wave abnormalities: nonspecific ST/T changes  Conduction Disutrbances:none  Narrative Interpretation:   Old EKG Reviewed: none available  CRITICAL CARE Performed by: Glynn Octave Total critical care time: 30  Critical care time was exclusive of separately billable procedures and treating other patients. Critical care was necessary to treat or prevent imminent or life-threatening deterioration. Critical care was time spent personally  by me on the following activities: development of treatment plan with patient and/or surrogate as well as nursing, discussions with consultants, evaluation of patient's response to treatment, examination of patient, obtaining history from patient or surrogate, ordering and performing treatments and interventions, ordering and review of laboratory studies, ordering and review of radiographic studies, pulse oximetry and re-evaluation of patient's condition.   Glynn Octave, MD 09/17/12 [phone]

## 2012-09-16 NOTE — ED Notes (Signed)
Pt alert, NAD, calm, interactive, resps e/u, speaking clearly, tolerating Bipap, "ready to wean to [place]", card NP into room, plan to wean to [place] per NP, RT to assess, pt denies pain or nausea, "feel better".

## 2012-09-16 NOTE — ED Notes (Addendum)
RT at [facility] for ABG, preparing to transport. No changes, alert, NAD, calm, tolerating BIpap. VSS.

## 2012-09-17 ENCOUNTER — Inpatient Hospital Stay (HOSPITAL_COMMUNITY): Payer: Medicaid Other

## 2012-09-17 DIAGNOSIS — R609 Edema, unspecified: Secondary | ICD-10-CM

## 2012-09-17 DIAGNOSIS — E785 Hyperlipidemia, unspecified: Secondary | ICD-10-CM

## 2012-09-17 DIAGNOSIS — Z9119 Patient's noncompliance with other medical treatment and regimen: Secondary | ICD-10-CM

## 2012-09-17 DIAGNOSIS — Z91199 Patient's noncompliance with other medical treatment and regimen due to unspecified reason: Secondary | ICD-10-CM

## 2012-09-17 DIAGNOSIS — I10 Essential (primary) hypertension: Secondary | ICD-10-CM

## 2012-09-17 DIAGNOSIS — I4891 Unspecified atrial fibrillation: Secondary | ICD-10-CM

## 2012-09-17 DIAGNOSIS — I359 Nonrheumatic aortic valve disorder, unspecified: Secondary | ICD-10-CM

## 2012-09-17 DIAGNOSIS — M7989 Other specified soft tissue disorders: Secondary | ICD-10-CM

## 2012-09-17 LAB — CBC
HCT: 47.1 % (ref 39.0–52.0)
Hemoglobin: 15.7 g/dL (ref 13.0–17.0)
Platelets: 158 10*3/uL (ref 150–400)
RBC: 5.14 MIL/uL (ref 4.22–5.81)
RBC: 5.16 MIL/uL (ref 4.22–5.81)
WBC: 5.8 10*3/uL (ref 4.0–10.5)
WBC: 5.7 10*3/uL (ref 4.0–10.5)

## 2012-09-17 LAB — TROPONIN I: Troponin I: <0.3 ng/mL (ref <0.30)

## 2012-09-17 LAB — HEMOGLOBIN A1C: Hgb A1c MFr Bld: 5.4 % (ref <5.7)

## 2012-09-17 LAB — BASIC METABOLIC PANEL
BUN: 36 mg/dL — ABNORMAL HIGH (ref 6–23)
CO2: 26 mEq/L (ref 19–32)
Chloride: 100 mEq/L (ref 96–112)
Glucose, Bld: 87 mg/dL (ref 70–99)
Potassium: 4.1 mEq/L (ref 3.5–5.1)
Sodium: 137 mEq/L (ref 135–145)

## 2012-09-17 LAB — LIPID PANEL
LDL Cholesterol: 83 mg/dL (ref 0–99)
Triglycerides: 77 mg/dL (ref <150)
VLDL: 15 mg/dL (ref 0–40)

## 2012-09-17 LAB — TSH: TSH: 1.994 u[IU]/mL (ref 0.350–4.500)

## 2012-09-17 LAB — HEPARIN LEVEL (UNFRACTIONATED)
Heparin Unfractionated: 0.63 IU/mL (ref 0.30–0.70)
Heparin Unfractionated: 0.5 IU/mL (ref 0.30–0.70)

## 2012-09-17 LAB — APTT: aPTT: 157 seconds — ABNORMAL HIGH (ref 24–37)

## 2012-09-17 LAB — T4, FREE: Free T4: 1.2 ng/dL (ref 0.80–1.80)

## 2012-09-17 LAB — MAGNESIUM: Magnesium: 2.4 mg/dL (ref 1.5–2.5)

## 2012-09-17 LAB — PROTIME-INR: INR: 1.19 (ref 0.00–1.49)

## 2012-09-17 LAB — PRO B NATRIURETIC PEPTIDE: Pro B Natriuretic peptide (BNP): 26816 pg/mL — ABNORMAL HIGH (ref 0–125)

## 2012-09-17 NOTE — Progress Notes (Signed)
ANTICOAGULATION CONSULT NOTE - Follow Up Consult  Pharmacy Consult for Heparin Indication: chest pain/ACS and atrial fibrillation  No Known Allergies  Patient Measurements: Height: 5\' 10"  (177.8 cm) Weight: 229 lb 11.5 oz (104.2 kg) IBW/kg (Calculated) : 73 Heparin Dosing Weight: 95 kg  Vital Signs: Temp: 97.6 F (36.4 C) (05/27 1130) Temp src: Oral (05/27 1130) BP: 102/69 mmHg (05/27 1000) Pulse Rate: 78 (05/27 1000)  Labs:  Recent Labs  09/16/12 1712 09/17/12 0010 09/17/12 0355 09/17/12 0600 09/17/12 1328  HGB 17.7*  --  15.7 15.7  --   HCT 51.6  --  47.1 47.3  --   PLT 191  --  167 158  --   APTT  --  157*  --   --   --   LABPROT 13.6 14.9  --   --   --   INR 1.05 1.19  --   --   --   HEPARINUNFRC  --   --   --  0.63 0.50  CREATININE 1.25  --  1.20  --   --   TROPONINI  --  <0.30 <0.30  --   --     Estimated Creatinine Clearance: 76.2 ml/min (by C-G formula based on Cr of 1.2).  Assessment:   Heparin level remains therapeutic on 1300 units/hrs.   Platelet count down some from baseline, 191->158K.  Goal of Therapy:  Heparin level 0.3-0.7 units/ml Monitor platelets by anticoagulation protocol: Yes   Plan:   Continue heparin drip at 1300 units/hr.  Daily heparin level and CBC; next in am.  Dennie Fetters, RPh Pager: [phone]  09/17/2012,2:15 PM

## 2012-09-17 NOTE — Progress Notes (Signed)
Utilization Review Completed. 09/17/2012  

## 2012-09-17 NOTE — Progress Notes (Signed)
[facility]  Subjective: SOB improved. No chest pain.   Objective: Vital signs in last 24 hours: Temp:  [97.5 F (36.4 C)-98.1 F (36.7 C)] 97.5 F (36.4 C) (05/27 0711) Pulse Rate:  [43-132] 90 (05/27 0700) Resp:  [12-30] 20 (05/27 0700) BP: (98-119)/(52-96) 108/55 mmHg (05/27 0700) SpO2:  [86 %-100 %] 100 % (05/27 0700) FiO2 (%):  [28 %-60 %] 28 % (05/26 2226) Weight:  [228 lb (103.42 kg)-230 lb 9.6 oz (104.6 kg)] 229 lb 11.5 oz (104.2 kg) (05/27 0600)    Intake/Output from previous day: 05/26 0701 - 05/27 0700 In: 265.5 [I.V.:265.5] Out: 2925 [Urine:2925] Intake/Output this shift:    Medications Current Facility-Administered Medications  Medication Dose Route Frequency Provider Last Rate Last Dose  . 0.9 %  sodium chloride infusion   Intravenous Continuous Nada Boozer, NP 10 mL/hr at 09/17/12 0600    . acetaminophen (TYLENOL) tablet 650 mg  650 mg Oral Q4H PRN Nada Boozer, NP      . aspirin EC tablet 81 mg  81 mg Oral Daily Nada Boozer, NP      . atorvastatin (LIPITOR) tablet 40 mg  40 mg Oral q1800 Nada Boozer, NP      . diltiazem (CARDIZEM) 100 mg in dextrose 5 % 100 mL infusion  5-15 mg/hr Intravenous Titrated Nada Boozer, NP 5 mL/hr at 09/17/12 0600 5 mg/hr at 09/17/12 0600  . furosemide (LASIX) injection 80 mg  80 mg Intravenous Q8H Nada Boozer, NP   80 mg at 09/17/12 6045  . heparin ADULT infusion 100 units/mL (25000 units/250 mL)  1,300 Units/hr Intravenous Continuous Abran Duke, RPH 13 mL/hr at 09/17/12 0600 1,300 Units/hr at 09/17/12 0600  . LORazepam (ATIVAN) tablet 0.5 mg  0.5 mg Oral Q4H PRN Nada Boozer, NP      . metoprolol tartrate (LOPRESSOR) tablet 12.5 mg  12.5 mg Oral BID Nada Boozer, NP   12.5 mg at 09/16/12 2259  . nitroGLYCERIN (NITROSTAT) SL tablet 0.4 mg  0.4 mg Sublingual Q5 Min x 3 PRN Nada Boozer, NP      . nitroGLYCERIN 0.2 mg/mL in dextrose 5 % infusion  5 mcg/min Intravenous Titrated Nada Boozer, NP  1.5 mL/hr at 09/17/12 0600 5 mcg/min at 09/17/12 0600  . ondansetron (ZOFRAN) injection 4 mg  4 mg Intravenous Q6H PRN Nada Boozer, NP      . ondansetron [facility]) injection 4 mg  4 mg Intravenous Q6H PRN Nada Boozer, NP      . pantoprazole (PROTONIX) EC tablet 40 mg  40 mg Oral Daily Nada Boozer, NP        PE: General appearance: alert, cooperative and no distress Neck: + JVD Lungs: decreased BS, bibasilar rales, + wheezes Heart: irregularly irregular rhythm Extremities: 3+ bilateral LEE Pulses: 2+ radials, 1+ DPs Skin: warm and dry Neurologic: Grossly normal  Lab Results:   Recent Labs  09/16/12 1712 09/17/12 0355 09/17/12 0600  WBC 6.7 5.8 5.7  HGB 17.7* 15.7 15.7  HCT 51.6 47.1 47.3  PLT 191 167 158   BMET  Recent Labs  09/16/12 1712 09/16/12 2030 09/17/12 0355  NA 133*  --  137  K 5.0 4.4 4.1  CL 97  --  100  CO2 19  --  26  GLUCOSE 98  --  87  BUN 37*  --  36*  CREATININE 1.25  --  1.20  CALCIUM 9.1  --  8.5   PT/INR  Recent Labs  09/16/12 1712  09/17/12 0010  LABPROT 13.6 14.9  INR 1.05 1.19   Cholesterol  Recent Labs  09/17/12 0355  CHOL 145   Cardiac Enzymes Cardiac Panel (last 3 results)  Recent Labs  09/17/12 0010 09/17/12 0355  TROPONINI <0.30 <0.30    Assessment/Plan  Principal Problem:   Acute respiratory failure Active Problems:   Atrial fibrillation with RVR, was in atrial fib in 04/2011   HTN (hypertension)   Hyperlipidemia   Noncompliance   Tobacco use   Acute CHF, previous normal LV function and moderate AS- re-evaluate   Bilateral lower extremity edema, secondary to acute CHF  Plan: Cardiac enzymes negative x 2. Chronic AF. Rates are now controlled in the 70s. Currently on 5mg /hr of IV Cardizem and 12.5 mg of PO Lopressor BID. Continue with IV Cardizem for now. Will await results of 2D echo. If systolic function is normal and rates are controlled, will plan for possible transition to PO Cardizem later today. He has  had good diuresis with net fluid loss of 2.6 L. However, pt still has significant bilateral LEE. He received 40 mg IV in ER and 80 mg last PM. Renal function is stable at 1.20. K+ is WNL at 4.1. Continue with current dosing of 80 mg IV Q8. Continue strict I/Os and daily weights. May need to consider RHC. MD to follow with further recommendation.   LOS: 1 day    Brittainy M. Delmer Islam 09/17/2012 7:57 AM  Agree with note written by Boyce Medici  PAC  Diuresing. HR better controlled on IV Dilt. On IV hep and NTG as well. 2D shows moderately severe AS with new LVD (EF 35%). Will need R/L heart cath at end of week once euvolemic. On exam lungs are clear, cor irreg. AS murmur with ? Carotid radiation. Erythematous feet ? Cellulitis.   Runell Gess 09/17/2012 4:02 PM

## 2012-09-17 NOTE — Progress Notes (Signed)
VASCULAR LAB PRELIMINARY  PRELIMINARY  PRELIMINARY  PRELIMINARY  Bilateral lower extremity venous duplex  completed.    Preliminary report:  Bilateral:  No evidence of DVT, superficial thrombosis, or Baker's Cyst.      Kateland Leisinger, RVT 09/17/2012, 9:57 AM

## 2012-09-17 NOTE — Progress Notes (Signed)
ANTICOAGULATION CONSULT NOTE - Follow Up Consult  Pharmacy Consult for heparin Indication: chest pain/ACS and atrial fibrillation  Labs:  Recent Labs  09/16/12 1712 09/17/12 0010 09/17/12 0355 09/17/12 0600  HGB 17.7*  --  15.7 15.7  HCT 51.6  --  47.1 47.3  PLT 191  --  167 158  APTT  --  157*  --   --   LABPROT 13.6 14.9  --   --   INR 1.05 1.19  --   --   HEPARINUNFRC  --   --   --  0.63  CREATININE 1.25  --  1.20  --   TROPONINI  --  <0.30 <0.30  --     Assessment/Plan:  62yo male therapeutic on heparin with initial dosing for CP and Afib.  Will continue gtt at current rate and confirm stable with additional level.  Vernard Gambles, PharmD, BCPS  09/17/2012,6:52 AM

## 2012-09-17 NOTE — Progress Notes (Signed)
  Echocardiogram 2D Echocardiogram has been performed.  Blakeleigh Domek FRANCES 09/17/2012, 11:57 AM

## 2012-09-18 DIAGNOSIS — I428 Other cardiomyopathies: Secondary | ICD-10-CM

## 2012-09-18 LAB — CBC
Platelets: 156 10*3/uL (ref 150–400)
RBC: 5.01 MIL/uL (ref 4.22–5.81)
WBC: 5.8 10*3/uL (ref 4.0–10.5)

## 2012-09-18 MED ORDER — CEFAZOLIN SODIUM 1-5 GM-% IV SOLN
1.0000 g | Freq: Four times a day (QID) | INTRAVENOUS | Status: AC
  Administered 2012-09-18 – 2012-09-19 (×7): 1 g via INTRAVENOUS
  Filled 2012-09-18 (×7): qty 50

## 2012-09-18 MED ORDER — METOPROLOL TARTRATE 25 MG PO TABS
25.0000 mg | ORAL_TABLET | Freq: Two times a day (BID) | ORAL | Status: DC
  Administered 2012-09-18 – 2012-09-23 (×11): 25 mg via ORAL
  Filled 2012-09-18 (×13): qty 1

## 2012-09-18 MED ORDER — ACETAMINOPHEN 500 MG PO TABS
500.0000 mg | ORAL_TABLET | Freq: Four times a day (QID) | ORAL | Status: DC | PRN
  Administered 2012-09-18 – 2012-09-21 (×8): 500 mg via ORAL
  Filled 2012-09-18: qty 1
  Filled 2012-09-18 (×2): qty 2
  Filled 2012-09-18 (×4): qty 1

## 2012-09-18 MED ORDER — FUROSEMIDE 10 MG/ML IJ SOLN
80.0000 mg | Freq: Two times a day (BID) | INTRAMUSCULAR | Status: DC
  Administered 2012-09-18 – 2012-09-19 (×2): 80 mg via INTRAVENOUS
  Filled 2012-09-18 (×3): qty 8

## 2012-09-18 NOTE — Progress Notes (Signed)
ANTICOAGULATION CONSULT NOTE - Follow Up Consult  Pharmacy Consult for Heparin Indication: chest pain/ACS and atrial fibrillation  No Known Allergies  Patient Measurements: Height: 5\' 10"  (177.8 cm) Weight: 226 lb 3.2 oz (102.604 kg) IBW/kg (Calculated) : 73 Heparin Dosing Weight: 95 kg  Vital Signs: Temp: 97.4 F (36.3 C) (05/28 0800) Temp src: Oral (05/28 0800) BP: 120/67 mmHg (05/28 0800) Pulse Rate: 82 (05/28 0800)  Labs:  Recent Labs  09/16/12 1712 09/17/12 0010 09/17/12 0355 09/17/12 0600 09/17/12 1310 09/17/12 1328 09/18/12 0220  HGB 17.7*  --  15.7 15.7  --   --  14.9  HCT 51.6  --  47.1 47.3  --   --  46.2  PLT 191  --  167 158  --   --  156  APTT  --  157*  --   --   --   --   --   LABPROT 13.6 14.9  --   --   --   --   --   INR 1.05 1.19  --   --   --   --   --   HEPARINUNFRC  --   --   --  0.63  --  0.50 0.46  CREATININE 1.25  --  1.20  --   --   --   --   TROPONINI  --  <0.30 <0.30  --  <0.30  --   --     Estimated Creatinine Clearance: 75.6 ml/min (by C-G formula based on Cr of 1.2).  Assessment:   Heparin level remains therapeutic on 1300 units/hrs.   Platelet count down some from baseline, 191->156K.   Planning L and RHC on Friday.  Goal of Therapy:  Heparin level 0.3-0.7 units/ml Monitor platelets by anticoagulation protocol: Yes   Plan:   Continue heparin drip at 1300 units/hr.  Daily heparin level and CBC; next in am.  Bmet added for am.  Dennie Fetters, RPh Pager: [phone]  09/18/2012,9:21 AM

## 2012-09-18 NOTE — Progress Notes (Signed)
[facility]  Subjective: Feeling much better. Breathing has improved.   Objective: Vital signs in last 24 hours: Temp:  [97.4 F (36.3 C)-97.6 F (36.4 C)] 97.4 F (36.3 C) (05/28 0800) Pulse Rate:  [28-96] 79 (05/28 0700) Resp:  [16-28] 20 (05/28 0700) BP: (66-111)/(23-81) 106/76 mmHg (05/28 0700) SpO2:  [84 %-100 %] 97 % (05/28 0700) Weight:  [226 lb 3.2 oz (102.604 kg)] 226 lb 3.2 oz (102.604 kg) (05/28 0500) Last BM Date: 09/15/12  Intake/Output from previous day: 05/27 0701 - 05/28 0700 In: 1705.2 [P.O.:960; I.V.:745.2] Out: 3300 [Urine:3300] Intake/Output this shift: Total I/O In: -  Out: 400 [Urine:400]  Medications Current Facility-Administered Medications  Medication Dose Route Frequency Provider Last Rate Last Dose  . 0.9 %  sodium chloride infusion   Intravenous Continuous Nada Boozer, NP 10 mL/hr at 09/17/12 1600 10 mL/hr at 09/17/12 1600  . acetaminophen (TYLENOL) tablet 650 mg  650 mg Oral Q4H PRN Nada Boozer, NP      . aspirin EC tablet 81 mg  81 mg Oral Daily Nada Boozer, NP   81 mg at 09/17/12 1033  . atorvastatin (LIPITOR) tablet 40 mg  40 mg Oral q1800 Nada Boozer, NP   40 mg at 09/17/12 1701  . diltiazem (CARDIZEM) 100 mg in dextrose 5 % 100 mL infusion  5-15 mg/hr Intravenous Titrated Nada Boozer, NP 5 mL/hr at 09/17/12 1600 5 mg/hr at 09/17/12 1600  . furosemide (LASIX) injection 80 mg  80 mg Intravenous Q8H Nada Boozer, NP   80 mg at 09/18/12 0657  . heparin ADULT infusion 100 units/mL (25000 units/250 mL)  1,300 Units/hr Intravenous Continuous Abran Duke, RPH 13 mL/hr at 09/17/12 1600 1,300 Units/hr at 09/17/12 1600  . LORazepam (ATIVAN) tablet 0.5 mg  0.5 mg Oral Q4H PRN Nada Boozer, NP      . metoprolol tartrate (LOPRESSOR) tablet 12.5 mg  12.5 mg Oral BID Nada Boozer, NP   12.5 mg at 09/17/12 2157  . nitroGLYCERIN (NITROSTAT) SL tablet 0.4 mg  0.4 mg Sublingual Q5 Min x 3 PRN Nada Boozer, NP      .  nitroGLYCERIN 0.2 mg/mL in dextrose 5 % infusion  5 mcg/min Intravenous Titrated Nada Boozer, NP 1.5 mL/hr at 09/17/12 1600 5 mcg/min at 09/17/12 1600  . ondansetron (ZOFRAN) injection 4 mg  4 mg Intravenous Q6H PRN Nada Boozer, NP      . ondansetron [facility]) injection 4 mg  4 mg Intravenous Q6H PRN Nada Boozer, NP      . pantoprazole (PROTONIX) EC tablet 40 mg  40 mg Oral Daily Nada Boozer, NP   40 mg at 09/17/12 1033    PE: General appearance: alert, cooperative and no distress Lungs: decreased BS bilateraly, + diffuse inspiratory wheezing Heart: irregularly irregular rhythm 2/6 AS murmur Extremities: 1+ bilateral LEE, LLE is erythematous  Pulses: 2+ and symmetric Skin: warm and dry Neurologic: Grossly normal  Lab Results:   Recent Labs  09/17/12 0355 09/17/12 0600 09/18/12 0220  WBC 5.8 5.7 5.8  HGB 15.7 15.7 14.9  HCT 47.1 47.3 46.2  PLT 167 158 156   BMET  Recent Labs  09/16/12 1712 09/16/12 2030 09/17/12 0355  NA 133*  --  137  K 5.0 4.4 4.1  CL 97  --  100  CO2 19  --  26  GLUCOSE 98  --  87  BUN 37*  --  36*  CREATININE 1.25  --  1.20  CALCIUM 9.1  --  8.5   PT/INR  Recent Labs  09/16/12 1712 09/17/12 0010  LABPROT 13.6 14.9  INR 1.05 1.19   Cholesterol  Recent Labs  09/17/12 0355  CHOL 145    Studies/Results: 2D echo Study Conclusions  - Left ventricle: The cavity size was normal. There was mild concentric hypertrophy. Systolic function was moderately to severely reduced. The estimated ejection fraction was in the range of 30% to 35%. Diffuse hypokinesis. Severe hypokinesis of the inferolateral myocardium. - Aortic valve: There was severe stenosis. Mild regurgitation. Valve area: 0.88cm^2(VTI). Valve area: 0.78cm^2 (Vmax). - Mitral valve: Calcified annulus. Mildly thickened leaflets . Mild regurgitation. - Left atrium: The atrium was severely dilated. - Right ventricle: The cavity size was severely dilated. Systolic function was  severely reduced. - Right atrium: The atrium was severely dilated. - Tricuspid valve: Moderate regurgitation. - Pulmonary arteries: Systolic pressure was mildly increased. PA peak pressure: 41mm Hg (S).   Assessment/Plan  Principal Problem:   Acute respiratory failure Active Problems:   Atrial fibrillation with RVR, was in atrial fib in 04/2011   HTN (hypertension)   Hyperlipidemia   Noncompliance   Tobacco use   Acute CHF, previous normal LV function and moderate AS- re-evaluate   Bilateral lower extremity edema, secondary to acute CHF  Plan: Pt continues in a-fib, however rates are controlled in the 80s-90s. Will d/c IV Diltiazem and increase BB dose, from 12.5 mg of Lopressor to 25 mg BID. BP is stable, with most recent SBP in the 120s. He continues to diurese well. LEE has improved significantly. Continue with IV Lasix. Will decrease dosing to BID. Labs pending. Will supplement K+ if needed. 2D echo yesterday demonstrated an EF of 30-35% and severe AS. Plan for possible R/LHC on Friday. Will add antibiotic for LLE cellulitis.     LOS: 2 days    [name] 09/18/2012 8:25 AM   Patient seen and examined. Agree with assessment and plan. Breathing much better. I/0 -4654 since admission. AF rate controlled; will dc iv cardizem and titrate lopressor to 25 bid. Decrease IV lasix to bid today. DC NTG. Severe AS on echo with 0.7 cm2 AVA; EF 30-35%. Fungal nail infection of toes and probable cellulitis of LE. Will start IV Ancef 1 gram every 6 hrs initially then change to oral. Plan R and L heart cath for Friday.    Lennette Bihari, MD, [facility] 09/18/2012 8:34 AM

## 2012-09-19 ENCOUNTER — Encounter (HOSPITAL_COMMUNITY): Payer: Self-pay | Admitting: Cardiology

## 2012-09-19 LAB — CBC
Hemoglobin: 14.8 g/dL (ref 13.0–17.0)
MCHC: 33.8 g/dL (ref 30.0–36.0)
Platelets: 151 10*3/uL (ref 150–400)
RBC: 4.79 MIL/uL (ref 4.22–5.81)

## 2012-09-19 LAB — HEPARIN LEVEL (UNFRACTIONATED): Heparin Unfractionated: 0.65 IU/mL (ref 0.30–0.70)

## 2012-09-19 LAB — BASIC METABOLIC PANEL
GFR calc non Af Amer: 58 mL/min — ABNORMAL LOW (ref >90)
Glucose, Bld: 105 mg/dL — ABNORMAL HIGH (ref 70–99)
Potassium: 3.6 mEq/L (ref 3.5–5.1)
Sodium: 139 mEq/L (ref 135–145)

## 2012-09-19 MED ORDER — DIGOXIN 0.25 MG/ML IJ SOLN
0.5000 mg | Freq: Once | INTRAMUSCULAR | Status: AC
  Administered 2012-09-19: 0.5 mg via INTRAVENOUS
  Filled 2012-09-19: qty 2

## 2012-09-19 MED ORDER — MIRTAZAPINE 15 MG PO TABS
15.0000 mg | ORAL_TABLET | Freq: Every day | ORAL | Status: DC
  Administered 2012-09-19 – 2012-10-07 (×19): 15 mg via ORAL
  Filled 2012-09-19 (×20): qty 1

## 2012-09-19 MED ORDER — FUROSEMIDE 10 MG/ML IJ SOLN
60.0000 mg | Freq: Two times a day (BID) | INTRAMUSCULAR | Status: DC
  Administered 2012-09-19 – 2012-09-20 (×2): 60 mg via INTRAVENOUS
  Filled 2012-09-19 (×2): qty 6
  Filled 2012-09-19: qty 2
  Filled 2012-09-19 (×2): qty 6

## 2012-09-19 MED ORDER — DIGOXIN 0.25 MG/ML IJ SOLN
0.2500 mg | Freq: Four times a day (QID) | INTRAMUSCULAR | Status: AC
  Administered 2012-09-19 (×2): 0.25 mg via INTRAVENOUS
  Filled 2012-09-19 (×2): qty 1

## 2012-09-19 MED ORDER — CEPHALEXIN 500 MG PO CAPS
500.0000 mg | ORAL_CAPSULE | Freq: Two times a day (BID) | ORAL | Status: DC
  Administered 2012-09-20 – 2012-09-23 (×7): 500 mg via ORAL
  Filled 2012-09-19 (×8): qty 1

## 2012-09-19 MED ORDER — DIGOXIN 125 MCG PO TABS
0.1250 mg | ORAL_TABLET | Freq: Every day | ORAL | Status: DC
  Administered 2012-09-20 – 2012-09-25 (×6): 0.125 mg via ORAL
  Filled 2012-09-19 (×7): qty 1

## 2012-09-19 NOTE — Progress Notes (Addendum)
[facility]  Subjective: Breathing better. No longer using supplemental O2. Denies CP.  Objective: Vital signs in last 24 hours: Temp:  [97.3 F (36.3 C)-98.2 F (36.8 C)] 98.2 F (36.8 C) (05/29 1141) Pulse Rate:  [29-105] 70 (05/29 1000) Resp:  [20-31] 26 (05/29 1000) BP: (90-136)/(43-87) 101/66 mmHg (05/29 1000) SpO2:  [94 %-100 %] 97 % (05/29 1000) Weight:  [218 lb 11.1 oz (99.2 kg)] 218 lb 11.1 oz (99.2 kg) (05/29 0500) Last BM Date: 09/15/12  Intake/Output from previous day: 05/28 0701 - 05/29 0700 In: 1660.5 [P.O.:1080; I.V.:430.5; IV Piggyback:150] Out: 2925 [Urine:2925] Intake/Output this shift: Total I/O In: 219 [P.O.:180; I.V.:39] Out: 800 [Urine:800]  Medications Current Facility-Administered Medications  Medication Dose Route Frequency Provider Last Rate Last Dose  . 0.9 %  sodium chloride infusion   Intravenous Continuous Nada Boozer, NP 20 mL/hr at 09/19/12 0758    . acetaminophen (TYLENOL) tablet 500 mg  500 mg Oral Q6H PRN Runell Gess, MD   500 mg at 09/19/12 0655  . aspirin EC tablet 81 mg  81 mg Oral Daily Nada Boozer, NP   81 mg at 09/19/12 0932  . atorvastatin (LIPITOR) tablet 40 mg  40 mg Oral q1800 Nada Boozer, NP   40 mg at 09/18/12 1726  . ceFAZolin (ANCEF) IVPB 1 g/50 mL premix  1 g Intravenous Q6H Marykay Lex, MD   1 g at 09/19/12 1152  . [START ON 09/20/2012] cephALEXin (KEFLEX) capsule 500 mg  500 mg Oral Q12H Marykay Lex, MD      . digoxin [facility]) 0.25 MG/ML injection 0.25 mg  0.25 mg Intravenous Q6H Marykay Lex, MD      . Melene Muller ON 09/20/2012] digoxin (LANOXIN) tablet 0.125 mg  0.125 mg Oral Daily Marykay Lex, MD      . furosemide (LASIX) injection 60 mg  60 mg Intravenous BID Marykay Lex, MD      . heparin ADULT infusion 100 units/mL (25000 units/250 mL)  1,300 Units/hr Intravenous Continuous Abran Duke, RPH 13 mL/hr at 09/19/12 1000 1,300 Units/hr at 09/19/12 1000  . LORazepam  (ATIVAN) tablet 0.5 mg  0.5 mg Oral Q4H PRN Nada Boozer, NP      . metoprolol tartrate (LOPRESSOR) tablet 25 mg  25 mg Oral BID Brittainy Simmons, PA-C   25 mg at 09/19/12 0932  . nitroGLYCERIN (NITROSTAT) SL tablet 0.4 mg  0.4 mg Sublingual Q5 Min x 3 PRN Nada Boozer, NP      . ondansetron [facility]) injection 4 mg  4 mg Intravenous Q6H PRN Nada Boozer, NP      . ondansetron [facility]) injection 4 mg  4 mg Intravenous Q6H PRN Nada Boozer, NP      . pantoprazole (PROTONIX) EC tablet 40 mg  40 mg Oral Daily Nada Boozer, NP   40 mg at 09/19/12 0932    PE: General appearance: alert, cooperative and no distress Lungs: decreased breath sounds bilaterally, mild bibasilar crackles & expiratory wheezing Heart: irregularly irregular rhythm, Harsh 3/6 SEM early peaking Extremities: 1-2+ Bilateral LEE Pulses: 1+ and symetric Skin: warm and dry Neurologic: Grossly normal  Lab Results:   Recent Labs  09/17/12 0600 09/18/12 0220 09/19/12 0415  WBC 5.7 5.8 5.8  HGB 15.7 14.9 14.8  HCT 47.3 46.2 43.8  PLT 158 156 151   BMET  Recent Labs  09/16/12 1712 09/16/12 2030 09/17/12 0355 09/19/12 0415  NA 133*  --  137 139  K 5.0 4.4 4.1 3.6  CL 97  --  100 99  CO2 19  --  26 31  GLUCOSE 98  --  87 105*  BUN 37*  --  36* 36*  CREATININE 1.25  --  1.20 1.28  CALCIUM 9.1  --  8.5 8.2*   PT/INR  Recent Labs  09/16/12 1712 09/17/12 0010  LABPROT 13.6 14.9  INR 1.05 1.19   Cholesterol  Recent Labs  09/17/12 0355  CHOL 145   Assessment/Plan  Principal Problem:   Acute respiratory failure Active Problems:   Acute combined systolic and diastolic CHF, NYHA class 3 -- previous normal LV function and moderate AS- re-evaluate; EF now 30-35% with regional WMA   Atrial fibrillation with RVR, was in atrial fib in 04/2011   HTN (hypertension)   Hyperlipidemia   Noncompliance   Tobacco use   Bilateral lower extremity edema, secondary to acute CHF  Plan: Pt continues to diurese well.  Net -1.26 L yesterday and -5.5 L since admission. Renal function remains stable with SCr of 1.28. However, pt continues to have significant LEE and faint bibasilar crackles on exam. Will need to continue diuresis, however will need to monitor closely, in the setting of severe AS. Systolic BP this am is in the upper 90s. His BB was increased yesterday. Will need to diurese gently to prevent hypotension. He is currently on 80 mg IV BID. May need to consider decreasing dose to 60 mg. Pt is scheduled for [facility] tomorrow w/ Dr. Allyson Sabal and hopefully the RHC can guide further diuretic therapy. He continues in a-fib. Rates are in the 110s-low 100s. IV Diltiazem was d/c yesterday and BB increased. May need to restart IV Diltiazem if adequate rate control is not maintained and if BP allows. The left lower extremity is less erythematous than yesterday. Continue with IV ancef for ? Cellulitis.     LOS: 3 days    Brittainy M. Sharol Harness, PA-C 09/19/2012 0850 AM  I have seen and evaluated the patient this AM along with Boyce Medici, PA. I agree with her findings, examination as well as impression recommendations.  Admitted with acute respiratory failure 2/2 AS, Afib related Acute on Systolic/Diastolic HF>  HR is relatively controlled, but goes up to 130s with activity -- with AS, no BP room for CCB.   Will load with IV Digoxin today & start PO tomorrow -- will also help with low EF  ? If reduced EF is related to Afib vs AS.  On IV Heparin until post-cath -- will need to determine Warfarin vs. NOAC for Afib related anticoagulation.  Plan R&LHC in AM for Sx purposes with AS & Cardiomyopathy.  Still needs diuresis - can reduce dose some today to avoid hypotension  Currently on Metoprolol for rate control -- would prefer Succinate (was on 100 mg at home along with ACE-I) vs. Carvedilol for low EF, but will stay with Metop Tartrate while titrating.  Agree with continued Abx - 1 more day IV, then convert to  Keflex tomorrow to complete 7 d course.   Relatively poor air movement -- is not on any COPD meds (but has smoking history) -- ? If wheezing is related to cardiac asthma vs. Simply COPD.  Acute Respiratory failure has resolved.  As he is not complaining of dyspnea, would avoid Beta Agonists (if needed would use Xopenex)  ? Benefit of inhaled steroid.   Labs stable.  Could probably transfer to TCU & possibly Tele post cath.  MD Time with pt: 25 min (total with NP ~35-40 min)  I spent ~15 minutes discussing the R&LHC procedure.  Performing MD:  Runell Gess, M.D., M.S.  Procedure:  Right & Left Heart Catheterization with Coronary Angiography +/- Percutaneous Coronary Intervention.  The procedure with Risks/Benefits/Alternatives and Indications was reviewed with the patient.  All questions were answered.    Risks / Complications include, but not limited to: Death, MI, CVA/TIA, VF/VT (with defibrillation), Bradycardia (need for temporary pacer placement), contrast induced nephropathy , bleeding / bruising / hematoma / pseudoaneurysm, vascular or coronary injury (with possible emergent CT or Vascular Surgery), adverse medication reactions, infection.  I emphasized the increased risk of cath with existing AS - significantly increases risk of CVA, Cardiac Arrest / Death.  The patient  and family) voice understanding -- will watch video with family.     Marykay Lex, M.D., M.S. [facility] [address] [place] [address] [place], [place]  [address]  [phone] Pager # [phone] 09/19/2012 12:40 PM

## 2012-09-19 NOTE — Progress Notes (Signed)
ANTICOAGULATION CONSULT NOTE - Follow Up Consult  Pharmacy Consult for Heparin Indication: chest pain/ACS and atrial fibrillation  No Known Allergies  Patient Measurements: Height: 5\' 10"  (177.8 cm) Weight: 218 lb 11.1 oz (99.2 kg) IBW/kg (Calculated) : 73 Heparin Dosing Weight: 95 kg  Vital Signs: Temp: 98.2 F (36.8 C) (05/29 0720) Temp src: Oral (05/29 0720) BP: 116/66 mmHg (05/29 0800) Pulse Rate: 95 (05/29 0800)  Labs:  Recent Labs  09/16/12 1712 09/17/12 0010 09/17/12 0355  09/17/12 0600 09/17/12 1310 09/17/12 1328 09/18/12 0220 09/19/12 0415  HGB 17.7*  --  15.7  --  15.7  --   --  14.9 14.8  HCT 51.6  --  47.1  --  47.3  --   --  46.2 43.8  PLT 191  --  167  --  158  --   --  156 151  APTT  --  157*  --   --   --   --   --   --   --   LABPROT 13.6 14.9  --   --   --   --   --   --   --   INR 1.05 1.19  --   --   --   --   --   --   --   HEPARINUNFRC  --   --   --   < > 0.63  --  0.50 0.46 0.65  CREATININE 1.25  --  1.20  --   --   --   --   --  1.28  TROPONINI  --  <0.30 <0.30  --   --  <0.30  --   --   --   < > = values in this interval not displayed.  Estimated Creatinine Clearance: 69.8 ml/min (by C-G formula based on Cr of 1.28).  Assessment:   Heparin level remains therapeutic on 1300 units/hrs.   Platelet count down some from baseline, 191K->150's x 3 days. No bleeding noted.     For L and RHC on Friday.  Goal of Therapy:  Heparin level 0.3-0.7 units/ml Monitor platelets by anticoagulation protocol: Yes   Plan:   Continue heparin drip at 1300 units/hr.  Daily heparin level and CBC; next in am.  Dennie Fetters, RPh Pager: [phone]  09/19/2012,9:33 AM

## 2012-09-19 NOTE — Progress Notes (Signed)
MD called with regards to pt's heart rate reaching into the mid 140's. Pt is asymptomatic MD ordered to give 25 of lopressor early and to reassess. He said that as long as HR is asymptomatic then he was not going to give anything else at this time. HR will continue to jump up into the 140's temporarily but will come right back down into the low 100's. Pt remains to be asymptomatic with increased HR.

## 2012-09-20 ENCOUNTER — Encounter (HOSPITAL_COMMUNITY)
Admission: EM | Disposition: A | Discharge status: Skilled Nursing Facility | Payer: Self-pay | Source: Home / Self Care | Attending: Cardiovascular Disease

## 2012-09-20 DIAGNOSIS — I509 Heart failure, unspecified: Secondary | ICD-10-CM

## 2012-09-20 DIAGNOSIS — I2510 Atherosclerotic heart disease of native coronary artery without angina pectoris: Principal | ICD-10-CM

## 2012-09-20 DIAGNOSIS — I359 Nonrheumatic aortic valve disorder, unspecified: Secondary | ICD-10-CM

## 2012-09-20 HISTORY — PX: LEFT AND RIGHT HEART CATHETERIZATION WITH CORONARY ANGIOGRAM: SHX5449

## 2012-09-20 LAB — BASIC METABOLIC PANEL
BUN: 25 mg/dL — ABNORMAL HIGH (ref 6–23)
Calcium: 8.2 mg/dL — ABNORMAL LOW (ref 8.4–10.5)
Creatinine, Ser: 0.95 mg/dL (ref 0.50–1.35)
GFR calc Af Amer: >90 mL/min (ref >90)
GFR calc non Af Amer: 87 mL/min — ABNORMAL LOW (ref >90)
Glucose, Bld: 101 mg/dL — ABNORMAL HIGH (ref 70–99)

## 2012-09-20 LAB — POCT I-STAT 3, ART BLOOD GAS (G3+)
Acid-Base Excess: 11 mmol/L — ABNORMAL HIGH (ref 0.0–2.0)
Bicarbonate: 36.3 mEq/L — ABNORMAL HIGH (ref 20.0–24.0)
TCO2: 38 mmol/L (ref 0–100)
pH, Arterial: 7.474 — ABNORMAL HIGH (ref 7.350–7.450)

## 2012-09-20 LAB — CBC
MCH: 30.4 pg (ref 26.0–34.0)
MCHC: 33.6 g/dL (ref 30.0–36.0)
MCV: 90.3 fL (ref 78.0–100.0)
Platelets: 144 10*3/uL — ABNORMAL LOW (ref 150–400)
RDW: 14.3 % (ref 11.5–15.5)

## 2012-09-20 LAB — POCT I-STAT 3, VENOUS BLOOD GAS (G3P V)
Acid-Base Excess: 11 mmol/L — ABNORMAL HIGH (ref 0.0–2.0)
O2 Saturation: 47 %
pO2, Ven: 26 mmHg — CL (ref 30.0–45.0)

## 2012-09-20 LAB — PROTIME-INR: Prothrombin Time: 15.6 seconds — ABNORMAL HIGH (ref 11.6–15.2)

## 2012-09-20 SURGERY — LEFT AND RIGHT HEART CATHETERIZATION WITH CORONARY ANGIOGRAM
Anesthesia: LOCAL

## 2012-09-20 MED ORDER — ACETAMINOPHEN 325 MG PO TABS
650.0000 mg | ORAL_TABLET | ORAL | Status: DC | PRN
  Administered 2012-09-22 – 2012-10-07 (×6): 650 mg via ORAL
  Filled 2012-09-20 (×6): qty 2

## 2012-09-20 MED ORDER — POTASSIUM CHLORIDE CRYS ER 20 MEQ PO TBCR
40.0000 meq | EXTENDED_RELEASE_TABLET | Freq: Once | ORAL | Status: AC
  Administered 2012-09-20: 40 meq via ORAL
  Filled 2012-09-20: qty 2

## 2012-09-20 MED ORDER — HEPARIN (PORCINE) IN NACL 100-0.45 UNIT/ML-% IJ SOLN
1500.0000 [IU]/h | INTRAMUSCULAR | Status: DC
  Administered 2012-09-21: 1450 [IU]/h via INTRAVENOUS
  Administered 2012-09-21: 1400 [IU]/h via INTRAVENOUS
  Administered 2012-09-22 – 2012-09-23 (×3): 1450 [IU]/h via INTRAVENOUS
  Administered 2012-09-24 – 2012-09-26 (×3): 1200 [IU]/h via INTRAVENOUS
  Administered 2012-09-27: 1300 [IU]/h via INTRAVENOUS
  Administered 2012-09-28: 1450 [IU]/h via INTRAVENOUS
  Administered 2012-09-29 – 2012-10-01 (×4): 1550 [IU]/h via INTRAVENOUS
  Administered 2012-10-02: 1600 [IU]/h via INTRAVENOUS
  Administered 2012-10-02 – 2012-10-05 (×4): 1550 [IU]/h via INTRAVENOUS
  Administered 2012-10-06 – 2012-10-07 (×2): 1500 [IU]/h via INTRAVENOUS
  Filled 2012-09-20 (×30): qty 250

## 2012-09-20 MED ORDER — FENTANYL CITRATE 0.05 MG/ML IJ SOLN
INTRAMUSCULAR | Status: AC
  Filled 2012-09-20: qty 2

## 2012-09-20 MED ORDER — SODIUM CHLORIDE 0.9 % IV SOLN
INTRAVENOUS | Status: AC
  Administered 2012-09-20: 20:00:00 via INTRAVENOUS

## 2012-09-20 MED ORDER — SODIUM CHLORIDE 0.9 % IJ SOLN
3.0000 mL | Freq: Two times a day (BID) | INTRAMUSCULAR | Status: DC
  Administered 2012-09-20: 3 mL via INTRAVENOUS

## 2012-09-20 MED ORDER — HEPARIN (PORCINE) IN NACL 2-0.9 UNIT/ML-% IJ SOLN
INTRAMUSCULAR | Status: AC
  Filled 2012-09-20: qty 1000

## 2012-09-20 MED ORDER — ASPIRIN 81 MG PO CHEW
324.0000 mg | CHEWABLE_TABLET | ORAL | Status: AC
  Administered 2012-09-20: 324 mg via ORAL
  Filled 2012-09-20: qty 4

## 2012-09-20 MED ORDER — MORPHINE SULFATE 2 MG/ML IJ SOLN
2.0000 mg | INTRAMUSCULAR | Status: DC | PRN
  Administered 2012-09-29 (×2): 2 mg via INTRAVENOUS
  Filled 2012-09-20 (×2): qty 1

## 2012-09-20 MED ORDER — LIDOCAINE HCL (PF) 1 % IJ SOLN
INTRAMUSCULAR | Status: AC
  Filled 2012-09-20: qty 30

## 2012-09-20 MED ORDER — SODIUM CHLORIDE 0.9 % IV SOLN: 250.0000 mL | INTRAVENOUS | Status: DC | PRN

## 2012-09-20 MED ORDER — ONDANSETRON HCL 4 MG/2ML IJ SOLN: 4.0000 mg | Freq: Four times a day (QID) | INTRAMUSCULAR | Status: DC | PRN

## 2012-09-20 MED ORDER — MIDAZOLAM HCL 2 MG/2ML IJ SOLN
INTRAMUSCULAR | Status: AC
  Filled 2012-09-20: qty 2

## 2012-09-20 MED ORDER — SODIUM CHLORIDE 0.9 % IJ SOLN: 3.0000 mL | INTRAMUSCULAR | Status: DC | PRN

## 2012-09-20 NOTE — Significant Event (Signed)
1700pm-pt arrived safely to Cath Lab. VS stable prior and during the transfer. Heparin drip turned off 1650pm on call to Cath Lab. Reported off to [place], Charity fundraiser. Will await patient's return to unit. Emelin Dascenzo, Charity fundraiser.

## 2012-09-20 NOTE — CV Procedure (Signed)
[name] is a 62 y.o. male    [id number] LOCATION:  FACILITY: [facility]  PHYSICIAN: Nanetta Batty, M.D. 1950/10/17   DATE OF PROCEDURE:  09/20/2012  DATE OF DISCHARGE:  [facility]  CARDIAC CATHETERIZATION     History obtained from chart review.62 year old WM with progressively worsening shortness of breath, leg swelling and palpitations for the past week. States he is out of his medications for the past month secondary to not be able to afford them. Denies any chest pain. States he has a history of CHF and atrial fibrillation. He arrived in moderate respiratory distress with abdominal breathing and crackles throughout. A. fib with RVR in the 150s. Denies any chest pain, cough or fever. good by mouth intake. States he has not seen his cardiologist Dr.Nolan Lasser in more than a year. He was tachypneic with pursed lip breathing with rales. Bipap applied. IV Lasix given. EKG with A fib with RVR has been given cardiazem as well.  History of Moderate AS on echo 05/2011 and normal LV function. Also concentric LVH. He has atrial fib and on last visit with Dr. Allyson Sabal he was in atrial fib. Dr. Allyson Sabal had placed Lt iliac stent but pt then had aorto-bifemoral bypass grafting by Dr. Myra Gianotti. Negative nuc study in 2010. Hx of ETOH withdrawal in past. He was admitted in congestive heart failure and underwent diuresis for the last several days. He has lost approximately is comfortable lying flat. His renal functions remained stable. He's been in A. Fib with a controlled ventricular response. 2-D echo revealed severe LV dysfunction with an aortic stenosis and a valve area measured at 0.8 cm. He presents now for right and left heart cath to define his anatomy and physiology.    PROCEDURE DESCRIPTION:    The patient was brought to the second floor [place] Cardiac cath lab in the postabsorptive state. He was premedicated with Valium 5 mg by mouth, IV Versed and fentanyl. His right  groin was prepped and shaved in usual sterile fashion. Xylocaine 1% was used for local anesthesia. A 6 French sheath was inserted into the right common femoral  artery using standard Seldinger technique. A 7 French sheath was placed in the right common femoral vein. A 7 French balloon tipped dilution Swan-Ganz catheter was advanced through the right heart chambers obtaining pressures and blood samples for Fick and thermodilution cardiac output. 5 French right and left Judkins diagnostic catheters were used for selective coronary angiography. A 5 French pigtail catheter was used for supravalvular aortography. Visipaque dye was used for the entirety of the case. Retrograde aortic pressure was monitored during the case. The aortic valve was never crossed and therefore left ventricular pressures were not obtained.   HEMODYNAMICS:    AO SYSTOLIC/AO DIASTOLIC: 130/79   Right atrial pressure:V wave equals 11, mean pressure 9  Right ventricular pressure: 53/5, mean 11   Pulmonary artery pressure: 63/34, mean 41  Pulmonary capillary wedge pressure:mean 20  Cardiac output/cardiac index: 3.9/1.8 Fick  Cardiac output/cardiac index: 3.7/1.7 TD  ANGIOGRAPHIC RESULTS:   1. Left main; 30-40% the distal  2. LAD; normal 3. Left circumflex; nondominant and normal.  4. Right coronary artery; dominant with 80% distal at the crux, 90% ostial PDA 5. Left ventriculography;not performed 6. Supravalvular aortography: The aortic valve was calcified. There is no obvious valve leaflet motion. There is no aortic insufficiency. The aortic root looked normal in caliber.  IMPRESSION:one vessel CAD involving the dominant right coronary artery with  most likely severe aortic stenosis and left ventricular dysfunction by 2-D echo. I suspect the patient may require aortic valve replacement and one-vessel bypass. The sheaths were removed and pressure will be held on the groin to achieve hemostasis. The patient left the Cath  Lab in stable condition. Heparin will be restarted and 6 hours per pharmacy.  Runell Gess MD, [facility] 09/20/2012 6:42 PM

## 2012-09-20 NOTE — Progress Notes (Signed)
09/20/12  Pharmacy- Heparin 1935   62yo male now s/p cath, to resume heparin 6hr after sheath pulled (=2400).  Heparin was adjusted for subtherapeutic level this AM.  1.  Resume Heparin 1400 units/hr 2.  Check heparin level 8hr 3.  Daily heparin level, CBC   Marisue Humble, PharmD Clinical Pharmacist [facility]

## 2012-09-20 NOTE — Progress Notes (Signed)
[facility]  Subjective: Still not able to lay completely flat. No SOB, CP, ABD pain.  He thinks the erythema in his legs has improved.   Objective: Vital signs in last 24 hours: Temp:  [98 F (36.7 C)-98.6 F (37 C)] 98.1 F (36.7 C) (05/30 0719) Pulse Rate:  [65-129] 99 (05/30 1000) Resp:  [20-32] 27 (05/30 1000) BP: (96-134)/(48-88) 127/80 mmHg (05/30 1000) SpO2:  [91 %-99 %] 91 % (05/30 1000) Weight:  [213 lb 10 oz (96.9 kg)] 213 lb 10 oz (96.9 kg) (05/30 0500) Last BM Date: 09/15/12  Intake/Output from previous day: 05/29 0701 - 05/30 0700 In: 1192 [P.O.:780; I.V.:312; IV Piggyback:100] Out: 4800 [Urine:4800] Intake/Output this shift: Total I/O In: 340.3 [P.O.:300; I.V.:40.3] Out: 875 [Urine:875]  Medications Current Facility-Administered Medications  Medication Dose Route Frequency Provider Last Rate Last Dose  . 0.9 %  sodium chloride infusion   Intravenous Continuous Nada Boozer, NP 20 mL/hr at 09/19/12 0758    . 0.9 %  sodium chloride infusion  250 mL Intravenous PRN Brittainy Simmons, PA-C      . acetaminophen (TYLENOL) tablet 500 mg  500 mg Oral Q6H PRN Runell Gess, MD   500 mg at 09/20/12 0206  . aspirin EC tablet 81 mg  81 mg Oral Daily Nada Boozer, NP   81 mg at 09/19/12 0932  . atorvastatin (LIPITOR) tablet 40 mg  40 mg Oral q1800 Nada Boozer, NP   40 mg at 09/19/12 1918  . cephALEXin (KEFLEX) capsule 500 mg  500 mg Oral Q12H Marykay Lex, MD   500 mg at 09/20/12 1026  . digoxin (LANOXIN) tablet 0.125 mg  0.125 mg Oral Daily Marykay Lex, MD   0.125 mg at 09/20/12 1026  . furosemide (LASIX) injection 60 mg  60 mg Intravenous BID Marykay Lex, MD   60 mg at 09/20/12 0825  . heparin ADULT infusion 100 units/mL (25000 units/250 mL)  1,400 Units/hr Intravenous Continuous Dennie Fetters, RPH 14 mL/hr at 09/20/12 1000 1,400 Units/hr at 09/20/12 1000  . LORazepam (ATIVAN) tablet 0.5 mg  0.5 mg Oral Q4H PRN Nada Boozer, NP      . metoprolol tartrate (LOPRESSOR) tablet 25 mg  25 mg Oral BID Brittainy Simmons, PA-C   25 mg at 09/20/12 1026  . mirtazapine (REMERON) tablet 15 mg  15 mg Oral QHS Lennette Bihari, MD   15 mg at 09/19/12 2207  . nitroGLYCERIN (NITROSTAT) SL tablet 0.4 mg  0.4 mg Sublingual Q5 Min x 3 PRN Nada Boozer, NP      . ondansetron [facility]) injection 4 mg  4 mg Intravenous Q6H PRN Nada Boozer, NP      . ondansetron [facility]) injection 4 mg  4 mg Intravenous Q6H PRN Nada Boozer, NP      . pantoprazole (PROTONIX) EC tablet 40 mg  40 mg Oral Daily Nada Boozer, NP   40 mg at 09/20/12 1026  . potassium chloride SA (K-DUR,KLOR-CON) CR tablet 40 mEq  40 mEq Oral Once Runell Gess, MD      . sodium chloride 0.9 % injection 3 mL  3 mL Intravenous Q12H Brittainy Simmons, PA-C   3 mL at 09/20/12 1027  . sodium chloride 0.9 % injection 3 mL  3 mL Intravenous PRN Robbie Lis, PA-C        PE: General appearance: alert, cooperative and no distress Lungs: decreased BS bilaterally.  Bi basilar rales. Heart: irregularly irregular  rhythm Abdomen: soft nontender, +BS Extremities: 2+ LEE greater on the left Pulses: 2+ and symmetric Skin: Mild erythema of the lower extremities.  Neurologic: Grossly normal  Lab Results:   Recent Labs  09/18/12 0220 09/19/12 0415 09/20/12 0405  WBC 5.8 5.8 6.0  HGB 14.9 14.8 15.0  HCT 46.2 43.8 44.6  PLT 156 151 144*   BMET  Recent Labs  09/19/12 0415 09/20/12 0405  NA 139 139  K 3.6 3.2*  CL 99 97  CO2 31 36*  GLUCOSE 105* 101*  BUN 36* 25*  CREATININE 1.28 0.95  CALCIUM 8.2* 8.2*   PT/INR  Recent Labs  09/20/12 0405  LABPROT 15.6*  INR 1.27    Assessment/Plan   Principal Problem:   Acute respiratory failure Active Problems:   Atrial fibrillation with RVR, was in atrial fib in 04/2011   HTN (hypertension)   Hyperlipidemia   Noncompliance   Tobacco use   Acute combined systolic and diastolic CHF, NYHA class 3 -- previous  normal LV function and moderate AS- re-evaluate; EF now 30-35% with regional WMA   Bilateral lower extremity edema, secondary to acute CHF  Plan:  He is feeling better.  Right and left heart caths today.   Net fluids:  -3.6L last 24hrs./-9.0L since adm.  He continues to be in afib with borderline rate control. 90-105bpm.  BP appears better and stable.  May be able to titrate lopressor up soon.  Potassium is being replaced.  ASA, lipitor, lasix 60mg  IV BID, lopressor 25mg  BID.  Continue current lasix dose.  SCr. Stable.   LOS: 4 days    HAGER, BRYAN 09/20/2012 10:33 AM  I have seen and examined the patient along with Wilburt Finlay, PA.  I have reviewed the chart, notes and new data.  I agree with PA's note.  Key new complaints: much improved; although still with mild orthopnea, he can lay at 30 degree HOB elevation Key examination changes: irregular rhythm; murmur is barely audible and S2 is absent Key new findings / data: normal renal function  PLAN: Reviewed risks and benefits of R/L heart cath in detail and he wishes to proceed.  Thurmon Fair, MD, [facility] [phone] 09/20/2012, 2:19 PM

## 2012-09-20 NOTE — H&P (Signed)
   Pt was reexamined and existing H & P reviewed. No changes found.  Runell Gess, MD [facility] 09/20/2012 5:21 PM

## 2012-09-20 NOTE — Care Management Note (Addendum)
    Page 1 of 2   10/18/2012     3:32:31 PM   CARE MANAGEMENT NOTE 10/18/2012  Patient:  [name], [name]   Account Number:  [id number]  Date Initiated:  09/17/2012  Documentation initiated by:  Alvira Philips Assessment:   62 yr-old male adm with dx of resp failure and AFib with RVR, lives alone, has been noncompliant with meds; has rolling walker, h/o home health through [facility]     Action/Plan:   Anticipated DC Date:  10/17/2012   Anticipated DC Plan:  SKILLED NURSING FACILITY  In-house referral  Clinical Social Worker      DC Planning Services  CM consult      Choice offered to / List presented to:             Status of service:  Completed, signed off Medicare Important Message given?   (If response is "NO", the following Medicare IM given date fields will be blank) Date Medicare IM given:   Date Additional Medicare IM given:    Discharge Disposition:  SKILLED NURSING FACILITY  Per UR Regulation:  Reviewed for med. necessity/level of care/duration of stay  If discussed at Long Length of Stay Meetings, dates discussed:   09/26/2012  10/10/2012  10/15/2012    Comments:  ContactAmore, Grater Brother [phone]                  Dixon,Sarah Daughter [phone]  10/18/12 Braylan Faul,RN,BSN [phone] PT DISCHARGED TO [facility] TODAY, PER CSW ARRANGEMENTS.  10/15/12 Emmerson Taddei,RN,BSN [phone] CSW CONT TO FOLLOW TO FACILITATE DC TO SNF WHEN MEDICALLY STABLE.  10/10/12 1150 Henrietta Mayo RN MSN BSN CCM Met with pt and brother @ bedside.  Both say family is trying to determine if there will be a family member that can provide pt with 24/7 assistance.  Also state pt may have the option of ST-SNF for rehab - have been informed on previous unit which facilities may offer a bed - request to talk with social worker.  CSW notified.  10/09/12 Verdis Prime RN MSN BSN CCM CABG/AVR 6/17, extubated, remains on dopamine/neosynephrine  gtts.  10/04/12 Sun Kihn,RN,BSN [phone] PT REQUESTS TO SPEAK TO CSW REGARDING POSSIBLE SHORT-TERM SNF AT DISCHARGE.  WILL CONSULT CSW TO SEE PT.  10/03/12 Rodman Recupero,RN,BSN [phone] CHECKED WITH PT TO SEE IF DC PLAN REMAINS THE SAME:  PT HAD PLANNED TO STAY WITH HIS DAUGHTER AT DC, BUT SHE JUST GAVE BIRTH YESTERDAY.  UNCERTAIN IF THIS WILL WORK OUT. PT STATES HE HAS OTHER OPTIONS FOR DC "IN THE WORKS".  WILL FOLLOW UP WITH PT/FINALIZE DC PLANS WHEN AVAILABLE.  09/30/12 Lygia Olaes,RN,BSN [phone] CABG SCHEDULED FOR JUNE 17.  CURRENTLY ON IV LASIX WITH 3+EDEMA.  WILL FOLLOW PROGRESS.  6/4 1303p debbie dowell rn,bsn spoke w pt. he plans on having his mom and brothers stay w him the 2 weeks after cabg. still waiting on exact day of cabg. no pcp or ins at present. will give pt primary care resourse list ant 2 prescription discount cards. financial co should see to see if pt elidg for any assist or programs.  09-20-12 - 9:50am Avie Arenas, RNBSN [phone] Non compliant - no insurance - will need to review meds to assure we are using all or as many as possible 4 dollar meds on discharge.

## 2012-09-20 NOTE — Progress Notes (Signed)
ANTICOAGULATION CONSULT NOTE - Follow Up Consult  Pharmacy Consult for Heparin Indication: chest pain/ACS and atrial fibrillation  No Known Allergies  Patient Measurements: Height: 5\' 10"  (177.8 cm) Weight: 213 lb 10 oz (96.9 kg) IBW/kg (Calculated) : 73 Heparin Dosing Weight: 95 kg  Vital Signs: Temp: 98.1 F (36.7 C) (05/30 0719) Temp src: Oral (05/30 0719) BP: 128/71 mmHg (05/30 0800) Pulse Rate: 101 (05/30 0800)  Labs:  Recent Labs  09/17/12 1310  09/18/12 0220 09/19/12 0415 09/20/12 0405  HGB  --   < > 14.9 14.8 15.0  HCT  --   --  46.2 43.8 44.6  PLT  --   --  156 151 144*  LABPROT  --   --   --   --  15.6*  INR  --   --   --   --  1.27  HEPARINUNFRC  --   < > 0.46 0.65 0.25*  CREATININE  --   --   --  1.28 0.95  TROPONINI <0.30  --   --   --   --   < > = values in this interval not displayed.  Estimated Creatinine Clearance: 93 ml/min (by C-G formula based on Cr of 0.95).  Assessment:   Heparin level had been therapeutic on 1300 units/hrs for the last 3 days, but now just below goal this morning.   Platelet count down some from baseline, 191K->150's x 3 days, now 144K. No bleeding noted.     For L and RHC this afternoon.  May need Coumadin or NOAC for afib.  Goal of Therapy:  Heparin level 0.3-0.7 units/ml Monitor platelets by anticoagulation protocol: Yes   Plan:   Increase heparin drip to 1400 units/hr.  Will follow up post-cath.  Daily heparin level and CBC while on heparin.  Dennie Fetters, RPh Pager: [phone]  09/20/2012,8:55 AM

## 2012-09-21 DIAGNOSIS — I359 Nonrheumatic aortic valve disorder, unspecified: Secondary | ICD-10-CM

## 2012-09-21 DIAGNOSIS — I2510 Atherosclerotic heart disease of native coronary artery without angina pectoris: Secondary | ICD-10-CM

## 2012-09-21 LAB — BASIC METABOLIC PANEL
BUN: 20 mg/dL (ref 6–23)
Calcium: 8.3 mg/dL — ABNORMAL LOW (ref 8.4–10.5)
Creatinine, Ser: 0.93 mg/dL (ref 0.50–1.35)
GFR calc Af Amer: >90 mL/min (ref >90)
GFR calc non Af Amer: 88 mL/min — ABNORMAL LOW (ref >90)
Potassium: 3.7 mEq/L (ref 3.5–5.1)

## 2012-09-21 LAB — CBC
MCV: 91.1 fL (ref 78.0–100.0)
Platelets: 140 10*3/uL — ABNORMAL LOW (ref 150–400)
RDW: 14.5 % (ref 11.5–15.5)
WBC: 4.8 10*3/uL (ref 4.0–10.5)

## 2012-09-21 MED ORDER — FUROSEMIDE 10 MG/ML IJ SOLN
40.0000 mg | Freq: Two times a day (BID) | INTRAMUSCULAR | Status: DC
  Administered 2012-09-21 – 2012-10-01 (×21): 40 mg via INTRAVENOUS
  Filled 2012-09-21 (×20): qty 4

## 2012-09-21 MED ORDER — POTASSIUM CHLORIDE CRYS ER 20 MEQ PO TBCR
20.0000 meq | EXTENDED_RELEASE_TABLET | Freq: Every day | ORAL | Status: DC
  Administered 2012-09-21 – 2012-10-07 (×17): 20 meq via ORAL
  Filled 2012-09-21 (×18): qty 1

## 2012-09-21 NOTE — Progress Notes (Signed)
ANTICOAGULATION CONSULT NOTE - Follow Up Consult  Pharmacy Consult for Heparin Indication: chest pain/ACS and atrial fibrillation  No Known Allergies  Patient Measurements: Height: 5\' 10"  (177.8 cm) Weight: 207 lb 10.8 oz (94.2 kg) IBW/kg (Calculated) : 73 Heparin Dosing Weight: 95 kg  Vital Signs: Temp: 98.4 F (36.9 C) (05/31 1500) Temp src: Oral (05/31 1500) BP: 132/83 mmHg (05/31 1800) Pulse Rate: 120 (05/31 1800)  Labs:  Recent Labs  09/19/12 0415 09/20/12 0405 09/21/12 0341 09/21/12 0835 09/21/12 1655  HGB 14.8 15.0 15.0  --   --   HCT 43.8 44.6 45.2  --   --   PLT 151 144* 140*  --   --   LABPROT  --  15.6*  --   --   --   INR  --  1.27  --   --   --   HEPARINUNFRC 0.65 0.25*  --  0.28* 0.39  CREATININE 1.28 0.95  --  0.93  --     Estimated Creatinine Clearance: 93.7 ml/min (by C-G formula based on Cr of 0.93).  Assessment: 62 yo M admitted 09/16/2012 with SOB, leg swelling and palpitation, pending [facility] pharmacy consulted to dose heparin.  Coag:  CAD, Afib: heparin infusing at 1450 units/hr heparin level now at goal, No bleeding noted.    Goal of Therapy:  Heparin level 0.3-0.7 units/ml Monitor platelets by anticoagulation protocol: Yes   Plan:  Continue heparin drip to 1450 units/hr. Daily heparin level and CBC while on heparin.   Thank you for allowing pharmacy to be a part of this patients care team.  Lovenia Kim Pharm.D., BCPS Clinical Pharmacist 09/21/2012 7:10 PM Pager: [phone] Phone: [phone]

## 2012-09-21 NOTE — Progress Notes (Signed)
ANTICOAGULATION CONSULT NOTE - Follow Up Consult  Pharmacy Consult for Heparin Indication: chest pain/ACS and atrial fibrillation  No Known Allergies  Patient Measurements: Height: 5\' 10"  (177.8 cm) Weight: 207 lb 10.8 oz (94.2 kg) IBW/kg (Calculated) : 73 Heparin Dosing Weight: 95 kg  Vital Signs: Temp: 98.7 F (37.1 C) (05/31 0800) Temp src: Oral (05/31 0800) BP: 117/70 mmHg (05/31 1000) Pulse Rate: 100 (05/31 1000)  Labs:  Recent Labs  09/19/12 0415 09/20/12 0405 09/21/12 0341 09/21/12 0835  HGB 14.8 15.0 15.0  --   HCT 43.8 44.6 45.2  --   PLT 151 144* 140*  --   LABPROT  --  15.6*  --   --   INR  --  1.27  --   --   HEPARINUNFRC 0.65 0.25*  --  0.28*  CREATININE 1.28 0.95  --  0.93    Estimated Creatinine Clearance: 93.7 ml/min (by C-G formula based on Cr of 0.93).  Assessment: Patient continues on heparin s/p cath. Heparin level previously therapeutic on 1300 units/hrs, but now just below goal this morning at 1400 units/hr. Hgb ok, plts low but stable. No bleeding noted.   Goal of Therapy:  Heparin level 0.3-0.7 units/ml Monitor platelets by anticoagulation protocol: Yes   Plan:  Increase heparin drip to 1450 units/hr. 6-hr heparin level due at 1700 Daily heparin level and CBC while on heparin.  Thank you, Franchot Erichsen, Pharm.D. Clinical Pharmacist   Pager: [phone] 09/21/2012 10:24 AM

## 2012-09-21 NOTE — Consult Note (Signed)
[address]       [place] [address]             [phone]        [name] [facility] Medical Record #[id number] Date of Birth: [birth year]  Referring: No ref. provider found Primary Care: Runell Gess, MD  Chief Complaint:    Chief Complaint  Patient presents with  . Shortness of Breath    History of Present Illness:      62 year old WM with progressively worsening shortness of breath, leg swelling and palpitations for the past week. States he is out of his medications for the past month secondary to not be able to afford them. Denies any chest pain. States he has a history of CHF and atrial fibrillation. He arrived in moderate respiratory distress with abdominal breathing and crackles throughout. A. fib with RVR in the 150s. Denies any chest pain, cough or fever. good by mouth intake. States he has not seen his cardiologist Dr.Berry in more than a year. He was tachypneic with pursed lip breathing with rales. Bipap applied. IV Lasix given. EKG with A fib with RVR has been given cardiazem as well.  History of Moderate AS on echo 05/2011 and normal LV function. Also concentric LVH. He has atrial fib and on last visit with Dr. Allyson Sabal he was in atrial fib. Dr. Allyson Sabal had placed Lt iliac stent but pt then had aorto-bifemoral bypass grafting by Dr. Myra Gianotti. . Hx of ETOH withdrawal in past. Currently continues to smoke and drink alcohol on a daily bases.   For 3 weeks before admission hed had progressive lower extremity edema until time of admission when he noted anasarca to mid chest and erythremia of his legs. He notes he has been unable to climb stairs or walk fast for over one year.  No on coumadin for afib currently     Current Activity/ Functional Status: Patient is not independent with mobility/ambulation, transfers, ADL's, IADL's.   Zubrod Score: At the time of surgery this patient's most appropriate activity status/level should be described  as: []  Normal activity, no symptoms []  Symptoms, fully ambulatory [x]  Symptoms, in bed less than or equal to 50% of the time []  Symptoms, in bed greater than 50% of the time but less than 100% []  Bedridden []  Moribund  Past Medical History  Diagnosis Date  . Hypertension   . Gout   . Aortic stenosis     echo 06/19/08 with nomal LV function, moderate concentric LVH moderate aortic stenosis area 0.99 cm squared, peak gradient of 50 and mean of 31  . Atrial fibrillation   . HTN (hypertension)   . Hyperlipidemia   . Atrial fibrillation with RVR, was in atrial fib in 04/2011 09/16/2012  . Tobacco use 09/16/2012  . Heart murmur   . Acute combined systolic and diastolic CHF, NYHA class 3 -- previous normal LV function and moderate AS- re-evaluate; EF now 30-35% with regional [facility] 09/16/2012    Past Surgical History  Procedure Laterality Date  . Iliac artery stent  10/20/08    stent to lt iliac  . Aorto bifem bypass  12/18/08    by Dr. Myra Gianotti    History  Smoking status  . Current Every Day Smoker -- 1.00 packs/day  . Types: Cigarettes  Smokeless tobacco  . Never Used    History  Alcohol Use  . 16.8 oz/week  . 28 Cans of beer per week  History   Social History  . Marital Status: Divorced    Spouse Name: N/A    Number of Children: N/A  . Years of Education: N/A   Occupational History  . Retired Naval architect   Social History Main Topics  . Smoking status: Current Every Day Smoker -- 1.00 packs/day    Types: Cigarettes  . Smokeless tobacco: Never Used  . Alcohol Use: 16.8 oz/week    28 Cans of beer per week  . Drug Use: No  . Sexually Active: Not on file    No Known Allergies  Current Facility-Administered Medications  Medication Dose Route Frequency Provider Last Rate Last Dose  . acetaminophen (TYLENOL) tablet 500 mg  500 mg Oral Q6H PRN Runell Gess, MD   500 mg at 09/20/12 1301  . acetaminophen (TYLENOL) tablet 650 mg  650 mg Oral Q4H PRN Runell Gess,  MD      . aspirin EC tablet 81 mg  81 mg Oral Daily Nada Boozer, NP   81 mg at 09/21/12 0925  . atorvastatin (LIPITOR) tablet 40 mg  40 mg Oral q1800 Nada Boozer, NP   40 mg at 09/19/12 1918  . cephALEXin (KEFLEX) capsule 500 mg  500 mg Oral Q12H Marykay Lex, MD   500 mg at 09/21/12 1610  . digoxin (LANOXIN) tablet 0.125 mg  0.125 mg Oral Daily Marykay Lex, MD   0.125 mg at 09/21/12 9604  . furosemide (LASIX) injection 40 mg  40 mg Intravenous BID Wilburt Finlay, PA-C   40 mg at 09/21/12 0925  . heparin ADULT infusion 100 units/mL (25000 units/250 mL)  1,450 Units/hr Intravenous Continuous Lennette Bihari, MD 14.5 mL/hr at 09/21/12 1400 1,450 Units/hr at 09/21/12 1400  . LORazepam (ATIVAN) tablet 0.5 mg  0.5 mg Oral Q4H PRN Nada Boozer, NP      . metoprolol tartrate (LOPRESSOR) tablet 25 mg  25 mg Oral BID Brittainy Simmons, PA-C   25 mg at 09/21/12 5409  . mirtazapine (REMERON) tablet 15 mg  15 mg Oral QHS Lennette Bihari, MD   15 mg at 09/20/12 2239  . morphine 2 MG/ML injection 2 mg  2 mg Intravenous Q1H PRN Runell Gess, MD      . nitroGLYCERIN (NITROSTAT) SL tablet 0.4 mg  0.4 mg Sublingual Q5 Min x 3 PRN Nada Boozer, NP      . ondansetron [facility]) injection 4 mg  4 mg Intravenous Q6H PRN Nada Boozer, NP      . ondansetron [facility]) injection 4 mg  4 mg Intravenous Q6H PRN Nada Boozer, NP      . ondansetron [facility]) injection 4 mg  4 mg Intravenous Q6H PRN Runell Gess, MD      . pantoprazole (PROTONIX) EC tablet 40 mg  40 mg Oral Daily Nada Boozer, NP   40 mg at 09/21/12 8119  . potassium chloride SA (K-DUR,KLOR-CON) CR tablet 20 mEq  20 mEq Oral Daily Runell Gess, MD        Prescriptions prior to admission  Medication Sig Dispense Refill  . aspirin 325 MG tablet Take 325 mg by mouth daily.      . benazepril (LOTENSIN) 20 MG tablet Take 20 mg by mouth daily.      . benazepril-hydrochlorthiazide (LOTENSIN HCT) 20-12.5 MG per tablet Take 2 tablets by mouth daily.       . metoprolol succinate (TOPROL-XL) 100 MG 24 hr tablet Take 100 mg by mouth daily.  Take with or immediately following a meal.      . simvastatin (ZOCOR) 20 MG tablet Take 40 mg by mouth at bedtime.        Family History  Problem Relation Age of Onset  . Heart attack Father     died at 39 with MI  . Heart disease Brother      Review of Systems:     Cardiac Review of Systems: Y or N  Chest Pain [  n  ]  Resting SOB [ y  ] Exertional SOB  [  y]  Orthopnea [ y   Pedal Edema [  severe ]    Palpitations [ y ] Syncope  [ n ]   Presyncope [  n ]  General Review of Systems: [Y] = yes [  ]=no Constitional: recent weight change Cove.Etienne  ]; anorexia Cove.Etienne  ]; fatigue Cove.Etienne  ]; nausea [ n ]; night sweats [ n ]; fever [ n ]; or chills [n  ]                                                               Dental: poor dentition[ very poor, only 3 lower teeth, upper full plates ]; Last Dentist visit:   Eye : blurred vision [  ]; diplopia [   ]; vision changes [  ];  Amaurosis fugax[  ]; Resp: cough [ y ];  wheezing[ y ];  hemoptysis[n  ]; shortness of breath[ y ]; paroxysmal nocturnal dyspnea[ y ]; dyspnea on exertion[ y ]; or orthopnea[ y ];  GI:  gallstones[  ], vomiting[  ];  dysphagia[  ]; melena[  ];  hematochezia [  ]; heartburn[  ];   Hx of  Colonoscopy[  ]; GU: kidney stones [  ]; hematuria[  ];   dysuria [  ];  nocturia[  ];  history of     obstruction [  ]; urinary frequency [  ]             Skin: rash, swelling[  ];, hair loss[  ];  peripheral edema[  ];  or itching[  ]; Musculosketetal: myalgias[  ];  joint swelling[  ];  joint erythema[  ];  joint pain[  ];  back pain[  ]; foot pain  Heme/Lymph: bruising[  ];  bleeding[  ];  anemia[  ];  Neuro: TIA[  n];  headaches[  n  stroke[n  ];  vertigo[  ];  seizures[  ];   paresthesias[  ];  difficulty walking[  ];  Psych:depression[  ]; anxiety[  ];  Endocrine: diabetes[  n];  thyroid dysfunction[n  ];  Immunizations: Flu [ n ]; Pneumococcal[ n  ];  Other:  Physical Exam: BP 92/39  Pulse 102  Temp(Src) 98.1 F (36.7 C) (Oral)  Resp 26  Ht 5\' 10"  (1.778 m)  Wt 207 lb 10.8 oz (94.2 kg)  BMI 29.8 kg/m2  SpO2 100%  General appearance: alert, cooperative, appears older than stated age, flushed, mild distress, moderately obese and mildly dyspnic at rest in bed Neurologic: intact Heart: irregularly irregular rhythm and systolic murmur: holosystolic 3/6, crescendo throughout the precordium Lungs: diminished breath sounds bilaterally not wheezing Abdomen: soft, non-tender; bowel sounds normal; no masses,  no organomegaly and  edema of upper thightas and scrotom, midline addominal scar Extremities: significant  edema, redness and  tenderness in the calves or thighs, venous stasis dermatitis noted bilaterial and rt groin cath site stable no carotid  bruits   Diagnostic Studies & Laboratory data:     Recent Radiology Findings:  Dg Chest Port 1 View  09/17/2012   *RADIOLOGY REPORT*  Clinical Data: Acute CHF  PORTABLE CHEST - 1 VIEW  Comparison: Prior chest x-ray 09/16/2012  Findings: Persistent pulmonary vascular congestion with slightly increased interstitial and early alveolar edema.  Probable small layering pleural effusions.  Stable cardiomegaly.  Atherosclerotic calcification in the transverse aorta again noted.  No acute osseous abnormality.  No pneumothorax.  IMPRESSION:  Slightly progressed interstitial and now early alveolar edema.  Small layering pleural effusions.   Original Report Authenticated By: Malachy Moan, M.D.   Dg Chest Port 1 View  09/16/2012   *RADIOLOGY REPORT*  Clinical Data: Shortness of breath, bilateral leg swelling  PORTABLE CHEST - 1 VIEW  Comparison: 12/19/2008  Findings: Cardiomegaly with mild interstitial edema and suspected small left pleural effusion.  Associated bibasilar opacities, likely atelectasis.  No pneumothorax.  IMPRESSION: Cardiomegaly with mild interstitial edema and suspected small left  pleural effusion.   Original Report Authenticated By: Charline Bills, M.D.    Recent Lab Findings: Lab Results  Component Value Date   WBC 4.8 09/21/2012   HGB 15.0 09/21/2012   HCT 45.2 09/21/2012   PLT 140* 09/21/2012   GLUCOSE 101* 09/21/2012   CHOL 145 09/17/2012   TRIG 77 09/17/2012   HDL 47 09/17/2012   LDLCALC 83 09/17/2012   ALT 18 09/16/2012   AST 30 09/16/2012   NA 140 09/21/2012   K 3.7 09/21/2012   CL 97 09/21/2012   CREATININE 0.93 09/21/2012   BUN 20 09/21/2012   CO2 34* 09/21/2012   TSH 1.994 09/17/2012   INR 1.27 09/20/2012   HGBA1C 5.4 09/17/2012   ECHO: Patient: [name], [name] MR #: [id number] Study Date: 09/17/2012 Gender: M Age: 75 Height: 177.8cm Weight: 104.2kg BSA: 2.80m^2 Pt. Status: Room: 2315  ATTENDING Nicki Guadalajara PERFORMING Shvc ORDERING Leone Brand REFERRING Leone Brand SONOGRAPHER Christy Little, RCS cc:  ------------------------------------------------------------ LV EF: 30% - 35%  ------------------------------------------------------------ Indications: Atrial fibrillation - 427.31. CHF - 428.0.  ------------------------------------------------------------ History: Risk factors: Current tobacco use. Hypertension. Dyslipidemia.  ------------------------------------------------------------ Study Conclusions  - Left ventricle: The cavity size was normal. There was mild concentric hypertrophy. Systolic function was moderately to severely reduced. The estimated ejection fraction was in the range of 30% to 35%. Diffuse hypokinesis. Severe hypokinesis of the inferolateral myocardium. - Aortic valve: There was severe stenosis. Mild regurgitation. Valve area: 0.88cm^2(VTI). Valve area: 0.78cm^2 (Vmax). - Mitral valve: Calcified annulus. Mildly thickened leaflets . Mild regurgitation. - Left atrium: The atrium was severely dilated. - Right ventricle: The cavity size was severely dilated. Systolic function was severely reduced. -  Right atrium: The atrium was severely dilated. - Tricuspid valve: Moderate regurgitation. - Pulmonary arteries: Systolic pressure was mildly increased. PA peak pressure: 41mm Hg (S). Transthoracic echocardiography. M-mode, complete 2D, spectral Doppler, and color Doppler. Height: Height: 177.8cm. Height: 70in. Weight: Weight: 104.2kg. Weight: 229.2lb. Body mass index: BMI: 33kg/m^2. Body surface area: BSA: 2.27m^2. Blood pressure: 109/72. Patient status: Inpatient. Location: ICU/CCU  ------------------------------------------------------------  ------------------------------------------------------------ Left ventricle: The cavity size was normal. There was mild concentric hypertrophy. Systolic function was moderately to severely reduced. The estimated ejection fraction was in the range of 30% to  35%. Diffuse hypokinesis. Regional wall motion abnormalities: Severe hypokinesis of the inferolateral myocardium.  ------------------------------------------------------------ Aortic valve: Trileaflet; severely thickened, severely calcified leaflets. Doppler: There was severe stenosis. Mild regurgitation. VTI ratio of LVOT to aortic valve: 0.18. Valve area: 0.88cm^2(VTI). Indexed valve area: 0.38cm^2/m^2 (VTI). Peak velocity ratio of LVOT to aortic valve: 0.16. Valve area: 0.78cm^2 (Vmax). Indexed valve area: 0.34cm^2/m^2 (Vmax). Mean gradient: 33mm Hg (S). Peak gradient: 63mm Hg (S).  ------------------------------------------------------------ Aorta: The aorta was mildly calcified.  ------------------------------------------------------------ Mitral valve: Calcified annulus. Mildly thickened leaflets . Doppler: Mild regurgitation.  ------------------------------------------------------------ Left atrium: The atrium was severely dilated.  ------------------------------------------------------------ Right ventricle: The cavity size was severely dilated. Systolic function was  severely reduced.  ------------------------------------------------------------ Pulmonic valve: The valve appears to be grossly normal. Doppler: Trivial regurgitation.  ------------------------------------------------------------ Tricuspid valve: Structurally normal valve. Leaflet separation was normal. Doppler: Transvalvular velocity was within the normal range. Moderate regurgitation.  ------------------------------------------------------------ Pulmonary artery: Systolic pressure was mildly increased.  ------------------------------------------------------------ Right atrium: The atrium was severely dilated.  ------------------------------------------------------------ Pericardium: There was no pericardial effusion.  ------------------------------------------------------------ Systemic veins: Inferior vena cava: The vessel was dilated; the respirophasic diameter changes were blunted (< 50%); findings are consistent with elevated central venous pressure.  ------------------------------------------------------------ Post procedure conclusions Ascending Aorta:  - The aorta was mildly calcified.  ------------------------------------------------------------  2D measurements Normal Doppler measurements Normal Left ventricle Main pulmonary LVID ED, 44.1 mm 43-52 artery chord, Pressure 41 mm Hg =30 PLAX , S LVID ES, 31.9 mm 23-38 Left ventricle chord, LVET 330 ms ------ PLAX LVOT FS, 28 % >29 Peak 65.15 cm/s ------ chord, vel, S PLAX VTI, S 16.92 cm ------ LVPW, ED 13 mm ------ Stroke 82.4 ml ------ IVS/LVPW 0.99 <1.3 vol ratio, ED Stroke 35.8 ml/m^2 ------ Ventricular septum index IVS, ED 12.9 mm ------ Aortic valve LVOT Peak 398.1 cm/s ------ Diam, S 24.9 mm ------ vel, S 6 Area 4.87 cm^2 ------ Mean 270.4 cm/s ------ Aorta vel, S 8 Root 36 mm ------ VTI, S 94.29 cm ------ diam, ED Mean 33 mm Hg ------ Left atrium gradient AP dim 52 mm ------ , S AP dim 2.26  cm/m^2 <2.2 Peak 63 mm Hg ------ index gradient Right ventricle , S RVID ED, 43.9 mm 19-38 VTI 0.18 ------ PLAX ratio Minor 59.36 mm 26-43 LVOT/AV axis ED, Area, 0.88 cm^2 ------ A4C VTI Area 0.38 cm^2/m^ ------ index 2 (VTI) Peak vel 0.16 ------ ratio, LVOT/AV Area, 0.78 cm^2 ------ Vmax Area 0.34 cm^2/m^ ------ index 2 (Vmax) Regurg 269 cm/s ------ vel, ED Regurg 451 ms ------ PHT Regurg 29 mm Hg ------ gradient , ED Tricuspid valve Regurg 257 cm/s ------ peak vel Peak 26 mm Hg ------ RV-RA gradient , S Systemic veins Estimate 15 mm Hg ------ d CVP Right ventricle Pressure 41 mm Hg <30 , S  ------------------------------------------------------------ Prepared and Electronically Authenticated by  Croitoru, Mihai 2014-05-27T14:[id number]  CATH: CARDIAC CATHETERIZATION  History obtained from chart review.62 year old WM with progressively worsening shortness of breath, leg swelling and palpitations for the past week. States he is out of his medications for the past month secondary to not be able to afford them. Denies any chest pain. States he has a history of CHF and atrial fibrillation. He arrived in moderate respiratory distress with abdominal breathing and crackles throughout. A. fib with RVR in the 150s. Denies any chest pain, cough or fever. good by mouth intake. States he has not seen his cardiologist Dr.Berry in more than a year. He was tachypneic with pursed lip  breathing with rales. Bipap applied. IV Lasix given. EKG with A fib with RVR has been given cardiazem as well.  History of Moderate AS on echo 05/2011 and normal LV function. Also concentric LVH. He has atrial fib and on last visit with Dr. Allyson Sabal he was in atrial fib. Dr. Allyson Sabal had placed Lt iliac stent but pt then had aorto-bifemoral bypass grafting by Dr. Myra Gianotti. Negative nuc study in 2010. Hx of ETOH withdrawal in past. He was admitted in congestive heart failure and underwent diuresis for the last  several days. He has lost approximately is comfortable lying flat. His renal functions remained stable. He's been in A. Fib with a controlled ventricular response. 2-D echo revealed severe LV dysfunction with an aortic stenosis and a valve area measured at 0.8 cm. He presents now for right and left heart cath to define his anatomy and physiology.  PROCEDURE DESCRIPTION:  The patient was brought to the second floor [facility] cath lab in the postabsorptive state. He was premedicated with Valium 5 mg by mouth, IV Versed and fentanyl. His right groin  was prepped and shaved in usual sterile fashion. Xylocaine 1% was used for local anesthesia. A 6 French sheath was inserted into the right common femoral  artery using standard Seldinger technique. A 7 French sheath was placed in the right common femoral vein. A 7 French balloon tipped dilution Swan-Ganz catheter was advanced through the right heart chambers obtaining pressures and blood samples for Fick and thermodilution cardiac output. 5 French right and left Judkins diagnostic catheters were used for selective coronary angiography. A 5 French pigtail catheter was used for supravalvular aortography. Visipaque dye was used for the entirety of the case. Retrograde aortic pressure was monitored during the case. The aortic valve was never crossed and therefore left ventricular pressures were not obtained.  HEMODYNAMICS:  AO SYSTOLIC/AO DIASTOLIC: 130/79  Right atrial pressure:V wave equals 11, mean pressure 9  Right ventricular pressure: 53/5, mean 11  Pulmonary artery pressure: 63/34, mean 41  Pulmonary capillary wedge pressure:mean 20  Cardiac output/cardiac index: 3.9/1.8 Fick  Cardiac output/cardiac index: 3.7/1.7 TD  ANGIOGRAPHIC RESULTS:  1. Left main; 30-40% the distal  2. LAD; normal  3. Left circumflex; nondominant and normal.  4. Right coronary artery; dominant with 80% distal at the crux, 90% ostial PDA  5. Left ventriculography;not  performed  6. Supravalvular aortography: The aortic valve was calcified. There is no obvious valve leaflet motion. There is no aortic insufficiency. The aortic root looked normal in caliber.  IMPRESSION:one vessel CAD involving the dominant right coronary artery with most likely severe aortic stenosis and left ventricular dysfunction by 2-D echo. I suspect the patient may require aortic valve replacement and one-vessel bypass. The sheaths were removed and pressure will be held on the groin to achieve hemostasis. The patient left the Cath Lab in stable condition. Heparin will be restarted and 6 hours per pharmacy.  Runell Gess MD, [facility]  09/20/2012  6:42 PM      Assessment / Plan:      Acute respiratory failure  Atrial fibrillation with RVR, was in atrial fib in 04/2011  HTN (hypertension)  Hyperlipidemia  Noncompliance  Tobacco use and daily alcohol use   Acute combined systolic and diastolic CHF, NYHA class 3 -- previous normal LV function and critical aortic stenosis  EF now 30-35% with regional WMA  Bilateral lower extremity edema, secondary to acute CHF and poss cellulitis  Coronary artery disese  Patient will need  AVR,CABG, Poss MAZE, at least clipping atrial appendage Patient has not been on coumadin for AFib will discuss with Dr Allyson Sabal, question contraindications to coumadin Still fluid overloaded with significant eurhythmia  of legs question of infectious cellulitis Will need improvement in status before proceeding with AVR/CABG    Delight Ovens MD  Beeper [phone] Office [phone] 09/21/2012 2:33 PM

## 2012-09-21 NOTE — Progress Notes (Signed)
[facility]  Subjective: Breathing better.  No complaints.  No pain a cath site.  Objective: Vital signs in last 24 hours: Temp:  [97.7 F (36.5 C)-98.9 F (37.2 C)] 98.9 F (37.2 C) (05/31 0400) Pulse Rate:  [40-127] 106 (05/31 0700) Resp:  [18-38] 26 (05/31 0700) BP: (99-150)/(46-99) 123/57 mmHg (05/31 0700) SpO2:  [91 %-100 %] 100 % (05/31 0700) Weight:  [207 lb 10.8 oz (94.2 kg)] 207 lb 10.8 oz (94.2 kg) (05/31 0456) Last BM Date: 09/15/12  Intake/Output from previous day: 05/30 0701 - 05/31 0700 In: 827.7 [P.O.:340; I.V.:487.7] Out: 2425 [Urine:2425] Intake/Output this shift:    Medications Current Facility-Administered Medications  Medication Dose Route Frequency Provider Last Rate Last Dose  . acetaminophen (TYLENOL) tablet 500 mg  500 mg Oral Q6H PRN Dustin Gess, [name]   500 mg at 09/20/12 1301  . acetaminophen (TYLENOL) tablet 650 mg  650 mg Oral Q4H PRN Dustin Gess, [name]      . aspirin EC tablet 81 mg  81 mg Oral Daily Dustin Boozer, [name]   81 mg at 09/19/12 0932  . atorvastatin (LIPITOR) tablet 40 mg  40 mg Oral q1800 Dustin Boozer, [name]   40 mg at 09/19/12 1918  . cephALEXin (KEFLEX) capsule 500 mg  500 mg Oral Q12H Dustin Lex, [name]   500 mg at 09/20/12 2239  . digoxin (LANOXIN) tablet 0.125 mg  0.125 mg Oral Daily Dustin Lex, [name]   0.125 mg at 09/20/12 1026  . furosemide (LASIX) injection 60 mg  60 mg Intravenous BID Dustin Lex, [name]   60 mg at 09/20/12 0825  . heparin ADULT infusion 100 units/mL (25000 units/250 mL)  1,400 Units/hr Intravenous Continuous [name], [name] 14 mL/hr at 09/21/12 0016 1,400 Units/hr at 09/21/12 0016  . LORazepam (ATIVAN) tablet 0.5 mg  0.5 mg Oral Q4H PRN Dustin Boozer, [name]      . metoprolol tartrate (LOPRESSOR) tablet 25 mg  25 mg Oral BID Dustin Simmons, [name]   25 mg at 09/20/12 1940  . mirtazapine (REMERON) tablet 15 mg  15 mg Oral QHS Dustin Bihari, [name]   15 mg at 09/20/12 2239  .  morphine 2 MG/ML injection 2 mg  2 mg Intravenous Q1H PRN Dustin Gess, [name]      . nitroGLYCERIN (NITROSTAT) SL tablet 0.4 mg  0.4 mg Sublingual Q5 Min x 3 PRN Dustin Boozer, [name]      . ondansetron [facility]) injection 4 mg  4 mg Intravenous Q6H PRN Dustin Boozer, [name]      . ondansetron [facility]) injection 4 mg  4 mg Intravenous Q6H PRN Dustin Boozer, [name]      . ondansetron [facility]) injection 4 mg  4 mg Intravenous Q6H PRN Dustin Gess, [name]      . pantoprazole (PROTONIX) EC tablet 40 mg  40 mg Oral Daily Dustin Boozer, [name]   40 mg at 09/20/12 1026    PE: General appearance: alert, cooperative and no distress  Lungs: decreased BS bilaterally.  No wheeze or rhonchi Heart: irregularly irregular rhythm 2/6 sys MM Extremities: 1+ LEE greater on the left  Pulses: 2+ and symmetric  Skin: Mild erythema of the lower extremities.  Neurologic: Grossly normal   Lab Results:   Recent Labs  09/19/12 0415 09/20/12 0405 09/21/12 0341  WBC 5.8 6.0 4.8  HGB 14.8 15.0 15.0  HCT 43.8 44.6 45.2  PLT 151 144* 140*  BMET  Recent Labs  09/19/12 0415 09/20/12 0405  NA 139 139  K 3.6 3.2*  CL 99 97  CO2 31 36*  GLUCOSE 105* 101*  BUN 36* 25*  CREATININE 1.28 0.95  CALCIUM 8.2* 8.2*   PT/INR  Recent Labs  09/20/12 0405  LABPROT 15.6*  INR 1.27    Studies/Results: Right and left heart caths: HEMODYNAMICS:  AO SYSTOLIC/AO DIASTOLIC: 130/79  Right atrial pressure:V wave equals 11, mean pressure 9  Right ventricular pressure: 53/5, mean 11  Pulmonary artery pressure: 63/34, mean 41  Pulmonary capillary wedge pressure:mean 20  Cardiac output/cardiac index: 3.9/1.8 Fick  Cardiac output/cardiac index: 3.7/1.7 TD  ANGIOGRAPHIC RESULTS:  1. Left main; 30-40% the distal  2. LAD; normal  3. Left circumflex; nondominant and normal.  4. Right coronary artery; dominant with 80% distal at the crux, 90% ostial PDA  5. Left ventriculography;not performed  6. Supravalvular aortography: The  aortic valve was calcified. There is no obvious valve leaflet motion. There is no aortic insufficiency. The aortic root looked normal in caliber.  IMPRESSION:one vessel CAD involving the dominant right coronary artery with most likely severe aortic stenosis and left ventricular dysfunction by 2-D echo. I suspect the patient may require aortic valve replacement and one-vessel bypass. The sheaths were removed and pressure will be held on the groin to achieve hemostasis. The patient left the Cath Lab in stable condition. Heparin will be restarted and 6 hours per pharmacy.  Dustin Gess [name], [facility]  09/20/2012   Assessment/Plan  Principal Problem:   Acute respiratory failure Active Problems:   Atrial fibrillation with RVR, was in atrial fib in 04/2011   HTN (hypertension)   Hyperlipidemia   Noncompliance   Tobacco use   Acute combined systolic and diastolic CHF, NYHA class 3 -- previous normal LV function and moderate AS- re-evaluate; EF now 30-35% with regional WMA   Bilateral lower extremity edema, secondary to acute CHF  Plan:  Patient currently laying at ~20 deg.  No SOB.  Still with mild LEE.  SP R&L heart caths.  RCA with 80% distal and 90% ostial PDA.  Net fluids: -1.6L last 24hrs./-10.6L since adm. Will back IV lasix to 40mg  daily and reassess after rechecking SCr. this morning.  May need further potassium repletion. He continues to be in afib with borderline rate control. 90-105bpm. BP good now but hypotensive in the 80's earlier.  ASA, lipitor, lasix 60mg  IV BID, lopressor 25mg  BID. Continue current lasix dose.   Likely needs AVR and single vessel By-pass.  Transfer to [facility].   LOS: 5 days    Sparks, Dustin 09/21/2012   Agree with note written by [name] PAC  S/P R/L heart cath yesterday revealing 1 VD (Dom RCA) and severe AS (I was unable to cross AoV and supra valvular Ao Gram showed no leaflet excursion c/w 2D). He has CAF rate controlled. Back on iv hep. Labs OK. Continues  to diurese. Exam w/o change. Lungs clear. Will need 1V CABG/AVR +/- surgical MAZE . The family has requested Dr. Tyrone Sage who has been notified. Transfer to [employer].   [name],[name] 09/21/2012 8:05 AM   \  7:38 AM

## 2012-09-22 DIAGNOSIS — I2510 Atherosclerotic heart disease of native coronary artery without angina pectoris: Secondary | ICD-10-CM

## 2012-09-22 DIAGNOSIS — I359 Nonrheumatic aortic valve disorder, unspecified: Secondary | ICD-10-CM

## 2012-09-22 LAB — CBC
MCH: 30 pg (ref 26.0–34.0)
MCHC: 32.5 g/dL (ref 30.0–36.0)
Platelets: 138 10*3/uL — ABNORMAL LOW (ref 150–400)
RBC: 4.96 MIL/uL (ref 4.22–5.81)

## 2012-09-22 LAB — BASIC METABOLIC PANEL
Calcium: 8.1 mg/dL — ABNORMAL LOW (ref 8.4–10.5)
GFR calc non Af Amer: 86 mL/min — ABNORMAL LOW (ref >90)
Sodium: 139 mEq/L (ref 135–145)

## 2012-09-22 LAB — HEPARIN LEVEL (UNFRACTIONATED): Heparin Unfractionated: 0.5 IU/mL (ref 0.30–0.70)

## 2012-09-22 MED ORDER — SODIUM CHLORIDE 0.9 % IV SOLN: INTRAVENOUS | Status: DC | PRN

## 2012-09-22 NOTE — Progress Notes (Signed)
[address]       [place] [address]             [phone]                 2 Days Post-Op Procedure(s) (LRB): LEFT AND RIGHT HEART CATHETERIZATION WITH CORONARY ANGIOGRAM (N/A)  LOS: 6 days   Subjective: Up to chair, feels better  Objective: Vital signs in last 24 hours: Patient Vitals for the past 24 hrs:  BP Temp Temp src Pulse Resp SpO2 Weight  09/22/12 0900 111/73 mmHg - - - 21 100 % -  09/22/12 0800 114/60 mmHg - - 64 33 100 % -  09/22/12 0700 119/81 mmHg - - 41 20 98 % -  09/22/12 0454 - - - - - - 206 lb 9.1 oz (93.7 kg)  09/22/12 0400 125/85 mmHg - - 65 27 100 % -  09/22/12 0000 100/57 mmHg 98.4 F (36.9 C) Oral 90 26 98 % -  09/21/12 2030 - - - 101 30 100 % -  09/21/12 2000 104/79 mmHg 98.6 F (37 C) Oral 54 32 100 % -  09/21/12 1900 109/58 mmHg - - 106 28 100 % -  09/21/12 1800 132/83 mmHg - - 120 30 100 % -  09/21/12 1700 111/82 mmHg - - 97 35 100 % -  09/21/12 1600 121/68 mmHg - - 100 31 100 % -  09/21/12 1500 113/66 mmHg 98.4 F (36.9 C) Oral 107 29 100 % -  09/21/12 1400 92/39 mmHg - - - 26 - -  09/21/12 1300 107/71 mmHg - - 102 23 100 % -  09/21/12 1200 103/66 mmHg - - 103 31 100 % -  09/21/12 1100 111/51 mmHg 98.1 F (36.7 C) Oral 96 25 100 % -    Filed Weights   09/20/12 0500 09/21/12 0456 09/22/12 0454  Weight: 213 lb 10 oz (96.9 kg) 207 lb 10.8 oz (94.2 kg) 206 lb 9.1 oz (93.7 kg)    Hemodynamic parameters for last 24 hours:    Intake/Output from previous day: 05/31 0701 - 06/01 0700 In: 1277.2 [P.O.:960; I.V.:317.2] Out: 2800 [Urine:2800] Intake/Output this shift: Total I/O In: 29 [I.V.:29] Out: 250 [Urine:250]  Scheduled Meds: . aspirin EC  81 mg Oral Daily  . atorvastatin  40 mg Oral q1800  . cephALEXin  500 mg Oral Q12H  . digoxin  0.125 mg Oral Daily  . furosemide  40 mg Intravenous BID  . metoprolol tartrate  25 mg Oral BID  . mirtazapine  15 mg Oral QHS  . pantoprazole  40 mg Oral Daily  . potassium  chloride  20 mEq Oral Daily   Continuous Infusions: . heparin 1,450 Units/hr (09/22/12 0900)   PRN Meds:.acetaminophen, acetaminophen, LORazepam, morphine injection, nitroGLYCERIN, ondansetron (ZOFRAN) IV, ondansetron, ondansetron (ZOFRAN) IV  Feet less swollen, still with erythema   Lab Results: CBC: Recent Labs  09/21/12 0341 09/22/12 0521  WBC 4.8 4.5  HGB 15.0 14.9  HCT 45.2 45.9  PLT 140* 138*   BMET:  Recent Labs  09/21/12 0835 09/22/12 0521  NA 140 139  K 3.7 3.5  CL 97 97  CO2 34* 35*  GLUCOSE 101* 101*  BUN 20 22  CREATININE 0.93 0.97  CALCIUM 8.3* 8.1*    PT/INR:  Recent Labs  09/20/12 0405  LABPROT 15.6*  INR 1.27   Lab Results  Component Value Date   ALT 18 09/16/2012   AST 30  09/16/2012   ALKPHOS 67 09/16/2012   BILITOT 0.7 09/16/2012    Radiology No results found.   Assessment/Plan: S/P Procedure(s) (LRB): LEFT AND RIGHT HEART CATHETERIZATION WITH CORONARY ANGIOGRAM (N/A) Pt consult, ambulate  AVR, CABG, Poss MAZE when ready    Delight Ovens MD 09/22/2012 10:31 AM

## 2012-09-22 NOTE — Progress Notes (Signed)
Subjective:  Less SOB, no CP   Objective:  Temp:  [98.1 F (36.7 C)-98.6 F (37 C)] 98.4 F (36.9 C) (06/01 0000) Pulse Rate:  [41-120] 41 (06/01 0700) Resp:  [20-35] 20 (06/01 0700) BP: (92-132)/(39-85) 119/81 mmHg (06/01 0700) SpO2:  [98 %-100 %] 98 % (06/01 0700) Weight:  [206 lb 9.1 oz (93.7 kg)] 206 lb 9.1 oz (93.7 kg) (06/01 0454) Weight change: -1 lb 1.6 oz (-0.5 kg)  Intake/Output from previous day: 05/31 0701 - 06/01 0700 In: 1262.7 [P.O.:960; I.V.:302.7] Out: 2800 [Urine:2800]  Intake/Output from this shift:    Physical Exam: General appearance: alert, cooperative and no distress Neck: no adenopathy, no carotid bruit, no JVD, supple, symmetrical, trachea midline and thyroid not enlarged, symmetric, no tenderness/mass/nodules Lungs: Decreased BS bilaterally w/o rales or wheezes Heart: irregularly irregular rhythm and 2nd systolic murmur: systolic ejection 2/6, crescendo at 2nd left intercostal space Extremities: edema 1+, and mild erythema  Lab Results: Results for orders placed during the hospital encounter of 09/16/12 (from the past 48 hour(s))  POCT I-STAT 3, BLOOD GAS (G3+)     Status: Abnormal   Collection Time    09/20/12  5:51 PM      Result Value Range   pH, Arterial 7.474 (*) 7.350 - 7.450   pCO2 arterial 49.4 (*) 35.0 - 45.0 mmHg   pO2, Arterial 46.0 (*) 80.0 - 100.0 mmHg   Bicarbonate 36.3 (*) 20.0 - 24.0 mEq/L   TCO2 38  0 - 100 mmol/L   O2 Saturation 83.0     Acid-Base Excess 11.0 (*) 0.0 - 2.0 mmol/L   Sample type ARTERIAL    POCT I-STAT 3, BLOOD GAS (G3P V)     Status: Abnormal   Collection Time    09/20/12  5:59 PM      Result Value Range   pH, Ven 7.441 (*) 7.250 - 7.300   pCO2, Ven 55.6 (*) 45.0 - 50.0 mmHg   pO2, Ven 26.0 (*) 30.0 - 45.0 mmHg   Bicarbonate 37.9 (*) 20.0 - 24.0 mEq/L   TCO2 40  0 - 100 mmol/L   O2 Saturation 47.0     Acid-Base Excess 11.0 (*) 0.0 - 2.0 mmol/L   Sample type VENOUS     Comment NOTIFIED PHYSICIAN     POCT ACTIVATED CLOTTING TIME     Status: None   Collection Time    09/20/12  6:23 PM      Result Value Range   Activated Clotting Time 165    CBC     Status: Abnormal   Collection Time    09/21/12  3:41 AM      Result Value Range   WBC 4.8  4.0 - 10.5 K/uL   RBC 4.96  4.22 - 5.81 MIL/uL   Hemoglobin 15.0  13.0 - 17.0 g/dL   HCT 16.1  09.6 - 04.5 %   MCV 91.1  78.0 - 100.0 fL   MCH 30.2  26.0 - 34.0 pg   MCHC 33.2  30.0 - 36.0 g/dL   RDW 40.9  81.1 - 91.4 %   Platelets 140 (*) 150 - 400 K/uL  HEPARIN LEVEL (UNFRACTIONATED)     Status: Abnormal   Collection Time    09/21/12  8:35 AM      Result Value Range   Heparin Unfractionated 0.28 (*) 0.30 - 0.70 IU/mL   Comment:            IF HEPARIN RESULTS ARE BELOW  EXPECTED VALUES, AND PATIENT     DOSAGE HAS BEEN CONFIRMED,     SUGGEST FOLLOW UP TESTING     OF ANTITHROMBIN III LEVELS.  BASIC METABOLIC PANEL     Status: Abnormal   Collection Time    09/21/12  8:35 AM      Result Value Range   Sodium 140  135 - 145 mEq/L   Potassium 3.7  3.5 - 5.1 mEq/L   Chloride 97  96 - 112 mEq/L   CO2 34 (*) 19 - 32 mEq/L   Glucose, Bld 101 (*) 70 - 99 mg/dL   BUN 20  6 - 23 mg/dL   Creatinine, Ser 6.57  0.50 - 1.35 mg/dL   Calcium 8.3 (*) 8.4 - 10.5 mg/dL   GFR calc non Af Amer 88 (*) >90 mL/min   GFR calc Af Amer >90  >90 mL/min   Comment:            The eGFR has been calculated     using the CKD EPI equation.     This calculation has not been     validated in all clinical     situations.     eGFR's persistently     <90 mL/min signify     possible Chronic Kidney Disease.  HEPARIN LEVEL (UNFRACTIONATED)     Status: None   Collection Time    09/21/12  4:55 PM      Result Value Range   Heparin Unfractionated 0.39  0.30 - 0.70 IU/mL   Comment:            IF HEPARIN RESULTS ARE BELOW     EXPECTED VALUES, AND PATIENT     DOSAGE HAS BEEN CONFIRMED,     SUGGEST FOLLOW UP TESTING     OF ANTITHROMBIN III LEVELS.  HEPARIN LEVEL  (UNFRACTIONATED)     Status: None   Collection Time    09/22/12  5:21 AM      Result Value Range   Heparin Unfractionated 0.50  0.30 - 0.70 IU/mL   Comment:            IF HEPARIN RESULTS ARE BELOW     EXPECTED VALUES, AND PATIENT     DOSAGE HAS BEEN CONFIRMED,     SUGGEST FOLLOW UP TESTING     OF ANTITHROMBIN III LEVELS.  CBC     Status: Abnormal   Collection Time    09/22/12  5:21 AM      Result Value Range   WBC 4.5  4.0 - 10.5 K/uL   RBC 4.96  4.22 - 5.81 MIL/uL   Hemoglobin 14.9  13.0 - 17.0 g/dL   HCT 84.6  96.2 - 95.2 %   MCV 92.5  78.0 - 100.0 fL   MCH 30.0  26.0 - 34.0 pg   MCHC 32.5  30.0 - 36.0 g/dL   RDW 84.1  32.4 - 40.1 %   Platelets 138 (*) 150 - 400 K/uL  BASIC METABOLIC PANEL     Status: Abnormal   Collection Time    09/22/12  5:21 AM      Result Value Range   Sodium 139  135 - 145 mEq/L   Potassium 3.5  3.5 - 5.1 mEq/L   Chloride 97  96 - 112 mEq/L   CO2 35 (*) 19 - 32 mEq/L   Glucose, Bld 101 (*) 70 - 99 mg/dL   BUN 22  6 - 23 mg/dL   Creatinine, Ser  0.97  0.50 - 1.35 mg/dL   Calcium 8.1 (*) 8.4 - 10.5 mg/dL   GFR calc non Af Amer 86 (*) >90 mL/min   GFR calc Af Amer >90  >90 mL/min   Comment:            The eGFR has been calculated     using the CKD EPI equation.     This calculation has not been     validated in all clinical     situations.     eGFR's persistently     <90 mL/min signify     possible Chronic Kidney Disease.    Imaging: Imaging results have been reviewed  Assessment/Plan:   1. Principal Problem: 2.   Acute respiratory failure 3. Active Problems: 4.   Atrial fibrillation with RVR, was in atrial fib in 04/2011 5.   HTN (hypertension) 6.   Hyperlipidemia 7.   Noncompliance 8.   Tobacco use 9.   Acute combined systolic and diastolic CHF, NYHA class 3 -- previous normal LV function and moderate AS- re-evaluate; EF now 30-35% with regional WMA 10.   Bilateral lower extremity edema, secondary to acute CHF 11.   Time Spent  Directly with Patient:  20 minutes  Length of Stay:  LOS: 6 days   Appreciate Dr. Dennie Maizes eval. Pt slowly improving. I/O neg. Less SOB. AFIB with CVR on iv hep. Lungs clear, no signif increased JVP, 1+ edema with mild erythema. Continue IV diuresis. Transfer to stepdown, [facility], re check CXR tomorrow. Surgery (ivCABG/AVR/MAZE/LAA clipping) sometime next week.  Runell Gess 09/22/2012, 8:02 AM

## 2012-09-22 NOTE — Progress Notes (Signed)
ANTICOAGULATION CONSULT NOTE - Follow Up Consult  Pharmacy Consult for Heparin Indication: chest pain/ACS and atrial fibrillation  No Known Allergies  Patient Measurements: Height: 5\' 10"  (177.8 cm) Weight: 206 lb 9.1 oz (93.7 kg) IBW/kg (Calculated) : 73 Heparin Dosing Weight: 95 kg  Vital Signs: Temp: 98.4 F (36.9 C) (06/01 0000) Temp src: Oral (06/01 0000) BP: 119/81 mmHg (06/01 0700) Pulse Rate: 41 (06/01 0700)  Labs:  Recent Labs  09/20/12 0405 09/21/12 0341 09/21/12 0835 09/21/12 1655 09/22/12 0521  HGB 15.0 15.0  --   --  14.9  HCT 44.6 45.2  --   --  45.9  PLT 144* 140*  --   --  138*  LABPROT 15.6*  --   --   --   --   INR 1.27  --   --   --   --   HEPARINUNFRC 0.25*  --  0.28* 0.39 0.50  CREATININE 0.95  --  0.93  --  0.97    Estimated Creatinine Clearance: 89.6 ml/min (by C-G formula based on Cr of 0.97).  Assessment: 62 yo M admitted 09/16/2012 with SOB, leg swelling and palpitation, pending [facility] pharmacy consulted to dose heparin.  Coag:  CAD, Afib: therapeutic heparin infusing at 1450 units/hr heparin, No bleeding noted.    Goal of Therapy:  Heparin level 0.3-0.7 units/ml Monitor platelets by anticoagulation protocol: Yes   Plan:  Continue heparin drip 1450 units/hr. Daily heparin level and CBC while on heparin.  Thank you, Franchot Erichsen, Pharm.D. Clinical Pharmacist   Pager: [phone] Phone: [phone] 09/22/2012 7:43 AM

## 2012-09-22 NOTE — Progress Notes (Addendum)
VASCULAR LAB PRELIMINARY  PRELIMINARY  PRELIMINARY  PRELIMINARY  Pre-op Cardiac Surgery  Carotid Findings:  Bilateral:  Mild irregular plaque through the CCA.  Mild mixed plaque in the ICA.  No significant ICA stenosis.  Vertebral artery flow is antegrade.  Thereasa Parkin, RVT 09/24/2012 9:45 AM    Upper Extremity Right Left  Brachial Pressures 115 Triphasic  95 Monophasic   Radial Waveforms Triphasic  Triphasic   Ulnar Waveforms Monophasic  absent  Palmar Arch (Allen's Test) Doppler decreases greater than 50% with both radial and ulnar compression. Doppler obliterates with radial compression, normal with ulnar compression.   Findings:      Lower  Extremity Right Left  Dorsalis Pedis 90  Monophasic  84 Monophasic   Anterior Tibial    Posterior Tibial 85  Monophasic  76  Monophasic   Ankle/Brachial Indices 0.78 mild decrease 0.73 moderate decrease    Kline Bulthuis, RVT 09/22/2012, 11:15 AM

## 2012-09-23 ENCOUNTER — Inpatient Hospital Stay (HOSPITAL_COMMUNITY): Payer: Medicaid Other

## 2012-09-23 ENCOUNTER — Encounter (HOSPITAL_COMMUNITY): Payer: Self-pay | Admitting: Cardiology

## 2012-09-23 DIAGNOSIS — I2510 Atherosclerotic heart disease of native coronary artery without angina pectoris: Secondary | ICD-10-CM

## 2012-09-23 DIAGNOSIS — J9600 Acute respiratory failure, unspecified whether with hypoxia or hypercapnia: Secondary | ICD-10-CM

## 2012-09-23 DIAGNOSIS — I350 Nonrheumatic aortic (valve) stenosis: Secondary | ICD-10-CM

## 2012-09-23 DIAGNOSIS — J4489 Other specified chronic obstructive pulmonary disease: Secondary | ICD-10-CM

## 2012-09-23 DIAGNOSIS — J439 Emphysema, unspecified: Secondary | ICD-10-CM | POA: Diagnosis present

## 2012-09-23 DIAGNOSIS — L03119 Cellulitis of unspecified part of limb: Secondary | ICD-10-CM

## 2012-09-23 DIAGNOSIS — J449 Chronic obstructive pulmonary disease, unspecified: Secondary | ICD-10-CM

## 2012-09-23 DIAGNOSIS — I739 Peripheral vascular disease, unspecified: Secondary | ICD-10-CM | POA: Diagnosis present

## 2012-09-23 DIAGNOSIS — L02419 Cutaneous abscess of limb, unspecified: Secondary | ICD-10-CM

## 2012-09-23 DIAGNOSIS — L0390 Cellulitis, unspecified: Secondary | ICD-10-CM | POA: Diagnosis present

## 2012-09-23 DIAGNOSIS — B9689 Other specified bacterial agents as the cause of diseases classified elsewhere: Secondary | ICD-10-CM

## 2012-09-23 DIAGNOSIS — R7881 Bacteremia: Secondary | ICD-10-CM

## 2012-09-23 DIAGNOSIS — Z01811 Encounter for preprocedural respiratory examination: Secondary | ICD-10-CM

## 2012-09-23 DIAGNOSIS — F17200 Nicotine dependence, unspecified, uncomplicated: Secondary | ICD-10-CM

## 2012-09-23 HISTORY — DX: Atherosclerotic heart disease of native coronary artery without angina pectoris: I25.10

## 2012-09-23 HISTORY — DX: Nonrheumatic aortic (valve) stenosis: I35.0

## 2012-09-23 HISTORY — DX: Peripheral vascular disease, unspecified: I73.9

## 2012-09-23 LAB — HEPARIN LEVEL (UNFRACTIONATED): Heparin Unfractionated: 0.55 IU/mL (ref 0.30–0.70)

## 2012-09-23 LAB — BASIC METABOLIC PANEL
Chloride: 98 mEq/L (ref 96–112)
Creatinine, Ser: 0.93 mg/dL (ref 0.50–1.35)
GFR calc Af Amer: >90 mL/min (ref >90)
Potassium: 3.5 mEq/L (ref 3.5–5.1)
Sodium: 140 mEq/L (ref 135–145)

## 2012-09-23 LAB — CBC
Platelets: 137 10*3/uL — ABNORMAL LOW (ref 150–400)
RDW: 14.4 % (ref 11.5–15.5)
WBC: 4.8 10*3/uL (ref 4.0–10.5)

## 2012-09-23 MED ORDER — LEVALBUTEROL HCL 1.25 MG/0.5ML IN NEBU
1.2500 mg | INHALATION_SOLUTION | Freq: Four times a day (QID) | RESPIRATORY_TRACT | Status: DC
  Administered 2012-09-23 – 2012-09-24 (×3): 1.25 mg via RESPIRATORY_TRACT
  Filled 2012-09-23 (×7): qty 0.5

## 2012-09-23 MED ORDER — CEPHALEXIN 500 MG PO CAPS
500.0000 mg | ORAL_CAPSULE | Freq: Four times a day (QID) | ORAL | Status: DC
  Administered 2012-09-23 – 2012-10-04 (×43): 500 mg via ORAL
  Filled 2012-09-23 (×47): qty 1

## 2012-09-23 MED ORDER — BUDESONIDE 0.25 MG/2ML IN SUSP
0.5000 mg | Freq: Two times a day (BID) | RESPIRATORY_TRACT | Status: DC
  Administered 2012-09-23 – 2012-09-24 (×2): 0.5 mg via RESPIRATORY_TRACT
  Filled 2012-09-23 (×5): qty 4

## 2012-09-23 MED ORDER — POTASSIUM CHLORIDE CRYS ER 20 MEQ PO TBCR
20.0000 meq | EXTENDED_RELEASE_TABLET | Freq: Once | ORAL | Status: AC
  Administered 2012-09-23: 20 meq via ORAL
  Filled 2012-09-23: qty 1

## 2012-09-23 MED ORDER — IPRATROPIUM BROMIDE 0.02 % IN SOLN
0.5000 mg | Freq: Four times a day (QID) | RESPIRATORY_TRACT | Status: DC
  Administered 2012-09-23 – 2012-09-24 (×3): 0.5 mg via RESPIRATORY_TRACT
  Filled 2012-09-23 (×3): qty 2.5

## 2012-09-23 MED ORDER — METOPROLOL TARTRATE 25 MG PO TABS
25.0000 mg | ORAL_TABLET | Freq: Three times a day (TID) | ORAL | Status: DC
Start: 2012-09-23 — End: 2012-09-30
  Administered 2012-09-23 – 2012-09-25 (×6): 25 mg via ORAL
  Administered 2012-09-25: 12.5 mg via ORAL
  Administered 2012-09-26 – 2012-09-30 (×12): 25 mg via ORAL
  Filled 2012-09-23 (×23): qty 1

## 2012-09-23 MED ORDER — POTASSIUM CHLORIDE CRYS ER 20 MEQ PO TBCR: 40.0000 meq | EXTENDED_RELEASE_TABLET | Freq: Once | ORAL | Status: DC

## 2012-09-23 MED ORDER — ALBUTEROL SULFATE (5 MG/ML) 0.5% IN NEBU
2.5000 mg | INHALATION_SOLUTION | Freq: Once | RESPIRATORY_TRACT | Status: AC
  Administered 2012-09-23: 2.5 mg via RESPIRATORY_TRACT

## 2012-09-23 MED ORDER — IPRATROPIUM BROMIDE 0.02 % IN SOLN: 0.5000 mg | Freq: Four times a day (QID) | RESPIRATORY_TRACT | Status: DC

## 2012-09-23 MED ORDER — LEVALBUTEROL HCL 1.25 MG/0.5ML IN NEBU: 1.2500 mg | INHALATION_SOLUTION | Freq: Four times a day (QID) | RESPIRATORY_TRACT | Status: DC

## 2012-09-23 NOTE — Progress Notes (Addendum)
[address]       [place] [address]             [phone]                 3 Days Post-Op Procedure(s) (LRB): LEFT AND RIGHT HEART CATHETERIZATION WITH CORONARY ANGIOGRAM (N/A)  LOS: 7 days   Subjective: Up to chair, feels better  Objective: Vital signs in last 24 hours: Patient Vitals for the past 24 hrs:  BP Temp Temp src Pulse Resp SpO2 Weight  09/23/12 1500 118/60 mmHg 98 F (36.7 C) Oral - 24 92 % -  09/23/12 1159 103/73 mmHg 97.8 F (36.6 C) Oral 101 23 93 % -  09/23/12 0908 - - - 117 - - -  09/23/12 0750 111/70 mmHg 97.8 F (36.6 C) Oral 86 29 98 % -  09/23/12 0400 123/78 mmHg 97.6 F (36.4 C) Oral - 25 98 % 200 lb 6.4 oz (90.9 kg)  09/23/12 0000 - - - - 23 - -  09/22/12 2324 105/64 mmHg 97.8 F (36.6 C) Oral - 19 97 % -  09/22/12 2100 - - - - 31 98 % -  09/22/12 2000 104/56 mmHg 97.9 F (36.6 C) Oral - 22 91 % -  09/22/12 1900 109/58 mmHg - - 32 33 98 % -  09/22/12 1800 128/76 mmHg - - 76 18 96 % -  09/22/12 1700 102/66 mmHg - - 83 24 95 % -    Filed Weights   09/21/12 0456 09/22/12 0454 09/23/12 0400  Weight: 207 lb 10.8 oz (94.2 kg) 206 lb 9.1 oz (93.7 kg) 200 lb 6.4 oz (90.9 kg)    Hemodynamic parameters for last 24 hours:    Intake/Output from previous day: 06/01 0701 - 06/02 0700 In: 1008 [P.O.:660; I.V.:348] Out: 2425 [Urine:2425] Intake/Output this shift: Total I/O In: 287 [P.O.:200; I.V.:87] Out: 400 [Urine:400]  Scheduled Meds: . aspirin EC  81 mg Oral Daily  . atorvastatin  40 mg Oral q1800  . budesonide  0.5 mg Nebulization BID  . cephALEXin  500 mg Oral Q6H  . digoxin  0.125 mg Oral Daily  . furosemide  40 mg Intravenous BID  . ipratropium  0.5 mg Nebulization Q6H  . levalbuterol  1.25 mg Nebulization Q6H  . metoprolol tartrate  25 mg Oral Q8H  . mirtazapine  15 mg Oral QHS  . pantoprazole  40 mg Oral Daily  . potassium chloride  20 mEq Oral Daily   Continuous Infusions: . sodium chloride    . heparin  1,450 Units/hr (09/23/12 4132)   PRN Meds:.sodium chloride, acetaminophen, acetaminophen, LORazepam, morphine injection, nitroGLYCERIN, ondansetron (ZOFRAN) IV, ondansetron, ondansetron (ZOFRAN) IV  PE Up in chair Neuro intact afib persists, murmur of as Feet more  Swollen today , still with erythema   Lab Results: CBC:  Recent Labs  09/22/12 0521 09/23/12 0345  WBC 4.5 4.8  HGB 14.9 14.6  HCT 45.9 45.1  PLT 138* 137*   BMET:   Recent Labs  09/22/12 0521 09/23/12 0345  NA 139 140  K 3.5 3.5  CL 97 98  CO2 35* 38*  GLUCOSE 101* 98  BUN 22 23  CREATININE 0.97 0.93  CALCIUM 8.1* 8.4    PT/INR: No results found for this basename: LABPROT, INR,  in the last 72 hours Lab Results  Component Value Date   ALT 18 09/16/2012   AST 30 09/16/2012   ALKPHOS 67 09/16/2012  BILITOT 0.7 09/16/2012    Radiology Dg Chest 2 View  09/23/2012   *RADIOLOGY REPORT*  Clinical Data: Congestive heart failure  CHEST - 2 VIEW  Comparison: Sep 17, 2012.  Findings: Stable mild cardiomegaly.  Bilateral perihilar and basilar interstitial edema is again noted but improved compared to prior exam.  Mild bilateral pleural effusions are again noted. Bony thorax appears intact.  IMPRESSION: Slightly improved bilateral pulmonary edema with persistent small bilateral pleural effusions.   Original Report Authenticated By: Lupita Raider.,  M.D.   Spirometry FEV1 15 % predicted   Assessment/Plan: S/P Procedure(s) (LRB): LEFT AND RIGHT HEART CATHETERIZATION WITH CORONARY ANGIOGRAM (N/A) Pt consult, ambulate  AVR, CABG, Poss MAZE when ready Have asked ID to comment on  PO keflex as RX for cellulitis  Discussed with pulmonary- evaluate pulmonary status before surgery   Delight Ovens MD 09/23/2012 4:14 PM

## 2012-09-23 NOTE — Evaluation (Signed)
Physical Therapy Evaluation Patient Details Name: OSKER AYOUB MRN: [id number] DOB: January 04, 1951 Today's Date: 09/23/2012 Time: 4098-1191 PT Time Calculation (min): 19 min  PT Assessment / Plan / Recommendation Clinical Impression  Pt is a 62 yo male with acute respiratory failure. Pt with supervision for bed mobility and requires minG to ambulate with RW but cardiopulmonary status limiting OOB activity progression. Pt would benefit from acute PT to improve transfer independence and increase activity tolerance. Defer d/c planning at this time to await medical treatment recommendations.     PT Assessment  Patient needs continued PT services    Follow Up Recommendations  Other (comment) (to be determined)    Does the patient have the potential to tolerate intense rehabilitation      Barriers to Discharge        Equipment Recommendations  Rolling walker with 5" wheels    Recommendations for Other Services     Frequency Min 3X/week    Precautions / Restrictions Restrictions Weight Bearing Restrictions: No   Pertinent Vitals/Pain 0/10      Mobility  Bed Mobility Bed Mobility: Supine to Sit;Sitting - Scoot to Edge of Bed Supine to Sit: 6: Modified independent (Device/Increase time);HOB elevated;With rails Sitting - Scoot to Edge of Bed: 6: Modified independent (Device/Increase time) Transfers Transfers: Stand to Sit;Sit to Stand Sit to Stand: 5: Supervision;From bed Stand to Sit: 5: Supervision;To chair/3-in-1 Ambulation/Gait Ambulation/Gait Assistance: 4: Min guard Ambulation Distance (Feet): 200 Feet Assistive device: Rolling walker Ambulation/Gait Assistance Details: Min G for eval Gait Pattern: Step-through pattern;Decreased stride length Gait velocity: decreased per pt report General Gait Details: Pt with increased fatigue and asking to return to room. Pt BP 99/61 following ambulation, pt with no dizziness or light headedness Stairs: No    Exercises     PT  Diagnosis: Difficulty walking  PT Problem List: Decreased activity tolerance;Cardiopulmonary status limiting activity PT Treatment Interventions: DME instruction;Gait training;Stair training;Functional mobility training;Therapeutic activities;Therapeutic exercise;Balance training;Neuromuscular re-education   PT Goals Acute Rehab PT Goals PT Goal Formulation: With patient Time For Goal Achievement: 10/07/12 Potential to Achieve Goals: Good Pt will go Supine/Side to Sit: Independently PT Goal: Supine/Side to Sit - Progress: Goal set today Pt will go Sit to Stand: Independently PT Goal: Sit to Stand - Progress: Goal set today Pt will go Stand to Sit: Independently PT Goal: Stand to Sit - Progress: Goal set today Pt will Stand: Independently PT Goal: Stand - Progress: Goal set today Pt will Ambulate: >150 feet;with least restrictive assistive device;with modified independence PT Goal: Ambulate - Progress: Goal set today Pt will Go Up / Down Stairs: 1-2 stairs;with modified independence;with least restrictive assistive device PT Goal: Up/Down Stairs - Progress: Goal set today  Visit Information  Last PT Received On: 09/23/12 Assistance Needed: +1    Subjective Data  Subjective: Pt recieved in bed, agreeable to PT but asks to be returned to bed after because he has been sitting up today   Prior Functioning  Home Living Lives With: Alone Available Help at Discharge: Family;Available 24 hours/day Type of Home: Apartment Home Access: Stairs to enter Entrance Stairs-Number of Steps: 2 Entrance Stairs-Rails: None Home Layout: One level Bathroom Shower/Tub: Engineer, manufacturing systems: Standard Bathroom Accessibility: No Home Adaptive Equipment: Walker - rolling Prior Function Level of Independence: Independent Able to Take Stairs?: Yes Driving: Yes Vocation: Retired Musician: No difficulties Dominant Hand: Right    Cognition   Cognition Arousal/Alertness: Awake/alert Behavior During Therapy: WFL for tasks assessed/performed Overall  Cognitive Status: Within Functional Limits for tasks assessed    Extremity/Trunk Assessment Right Upper Extremity Assessment RUE ROM/Strength/Tone: Within functional levels Left Upper Extremity Assessment LUE ROM/Strength/Tone: Within functional levels Right Lower Extremity Assessment RLE ROM/Strength/Tone: Within functional levels Left Lower Extremity Assessment LLE ROM/Strength/Tone: Within functional levels Trunk Assessment Trunk Assessment: Normal   Balance Balance Balance Assessed: Yes Static Sitting Balance Static Sitting - Balance Support: Bilateral upper extremity supported Static Sitting - Level of Assistance: 7: Independent Static Sitting - Comment/# of Minutes: 2  End of Session PT - End of Session Equipment Utilized During Treatment: Gait belt;Oxygen Activity Tolerance: Patient limited by fatigue Patient left: in bed;with call bell/phone within reach;with family/visitor present Nurse Communication:  (BP)  GP     09/23/2012, 4:08 PM Marvis Moeller, Student Physical Therapist Office #: [phone]   Agree with above assessment.  Lewis Shock, PT, DPT Pager #: [phone] Office #: [phone]

## 2012-09-23 NOTE — Consult Note (Addendum)
[facility]  Total days of antibiotics 6        Day 4 cephalexin        ( 2 days of cefazolin)       Reason for Consult:cellulitis    Referring Physician: gerhardt  Principal Problem:   Acute respiratory failure Active Problems:   Atrial fibrillation with RVR, was in atrial fib in 04/2011   HTN (hypertension)   Hyperlipidemia   Noncompliance   Tobacco use   Acute combined systolic and diastolic CHF, NYHA class 3 -- previous normal LV function and moderate AS- re-evaluate; EF now 30-35% with regional [facility]   Bilateral lower extremity edema, secondary to acute CHF   Aortic stenosis, severe   CAD (coronary artery disease), single vessel disease of RCA/PDA, mild disease of LM   Cellulitis, lower extremity- treated   PAD (peripheral artery disease), decreased bil. ABIs    HPI: [name] is a 62 y.o. male presented with acute combined systolic and diastolic CHF on 8/11 with symptoms of worsening shortness of breath, increasing lower extremity edema with erythema. He has been admitted to cardiology service where he has been steadily diuresed, underwent right and left heart catherization as well as placed on abtx for presumed cellulitis which has improved. Question posed to ID service is treatment of cellulitis. Pt has normal wbc no fevers presently  Past Medical History  Diagnosis Date  . Hypertension   . Gout   . Aortic stenosis     echo 06/19/08 with nomal LV function, moderate concentric LVH moderate aortic stenosis area 0.99 cm squared, peak gradient of 50 and mean of 31  . Atrial fibrillation   . HTN (hypertension)   . Hyperlipidemia   . Atrial fibrillation with RVR, was in atrial fib in 04/2011 09/16/2012  . Tobacco use 09/16/2012  . Heart murmur   . Acute combined systolic and diastolic CHF, NYHA class 3 -- previous normal LV function and moderate AS- re-evaluate; EF now 30-35% with regional [facility] 09/16/2012  . Aortic stenosis, severe 09/23/2012  .  CAD (coronary artery disease), single vessel disease 09/23/2012  . Cellulitis, lower extremity- treated 09/23/2012  . PAD (peripheral artery disease), decreased bil. ABIs 09/23/2012    Allergies: No Known Allergies   MEDICATIONS: . aspirin EC  81 mg Oral Daily  . atorvastatin  40 mg Oral q1800  . cephALEXin  500 mg Oral Q12H  . digoxin  0.125 mg Oral Daily  . furosemide  40 mg Intravenous BID  . metoprolol tartrate  25 mg Oral Q8H  . mirtazapine  15 mg Oral QHS  . pantoprazole  40 mg Oral Daily  . potassium chloride  20 mEq Oral Daily    History  Substance Use Topics  . Smoking status: Current Every Day Smoker -- 1.00 packs/day    Types: Cigarettes  . Smokeless tobacco: Never Used  . Alcohol Use: 16.8 oz/week    28 Cans of beer per week    Family History  Problem Relation Age of Onset  . Heart attack Father     died at 43 with MI  . Heart disease Brother       Review of Systems  Constitutional: Negative for fever, chills, diaphoresis, activity change, appetite change, fatigue and unexpected weight change.  HENT: Negative for congestion, sore throat, rhinorrhea, sneezing, trouble swallowing and sinus pressure.  Eyes: Negative for photophobia and visual disturbance.  Respiratory: Negative for cough, chest tightness, shortness of breath,  wheezing and stridor.  Cardiovascular: Negative for chest pain, palpitations and leg swelling.  Gastrointestinal: Negative for nausea, vomiting, abdominal pain, diarrhea, constipation, blood in stool, abdominal distention and anal bleeding.  Genitourinary: Negative for dysuria, hematuria, flank pain and difficulty urinating.  Musculoskeletal: Negative for myalgias, back pain, joint swelling, arthralgias and gait problem.  Skin: Negative for color change, pallor, rash and wound.  Neurological: Negative for dizziness, tremors, weakness and light-headedness.  Hematological: Negative for adenopathy. Does not bruise/bleed easily.    Psychiatric/Behavioral: Negative for behavioral problems, confusion, sleep disturbance, dysphoric mood, decreased concentration and agitation.    OBJECTIVE: Temp:  [97.6 F (36.4 C)-98.9 F (37.2 C)] 97.8 F (36.6 C) (06/02 1159) Pulse Rate:  [32-117] 101 (06/02 1159) Resp:  [18-33] 23 (06/02 1159) BP: (99-128)/(56-79) 103/73 mmHg (06/02 1159) SpO2:  [91 %-99 %] 93 % (06/02 1159) Weight:  [200 lb 6.4 oz (90.9 kg)] 200 lb 6.4 oz (90.9 kg) (06/02 0400) Gen: a x o by 3 in NAD  HEENT:normocephalic, sclera clear, mucus membranes moist  Neck:supple, no JVD, no bruits  Heart:S1S2 RRR with 3/6 systolic murmur, no gallup, rub or click  Lungs: + bibasilar rales, no rhonchi, or wheezes, no productive cough  ZOX:WRUE, non tender, + BS, do not palpate liver spleen or masses  AVW:UJWJX with 1-2 lower ext edema though much improved from admit with edema to waist.  Neuro:alert and oriented, MAE, follows commands  Skin:Warm and dry, brisk capillary refill  LABS: Results for orders placed during the hospital encounter of 09/16/12 (from the past 48 hour(s))  HEPARIN LEVEL (UNFRACTIONATED)     Status: None   Collection Time    09/21/12  4:55 PM      Result Value Range   Heparin Unfractionated 0.39  0.30 - 0.70 IU/mL   Comment:            IF HEPARIN RESULTS ARE BELOW     EXPECTED VALUES, AND PATIENT     DOSAGE HAS BEEN CONFIRMED,     SUGGEST FOLLOW UP TESTING     OF ANTITHROMBIN III LEVELS.  HEPARIN LEVEL (UNFRACTIONATED)     Status: None   Collection Time    09/22/12  5:21 AM      Result Value Range   Heparin Unfractionated 0.50  0.30 - 0.70 IU/mL   Comment:            IF HEPARIN RESULTS ARE BELOW     EXPECTED VALUES, AND PATIENT     DOSAGE HAS BEEN CONFIRMED,     SUGGEST FOLLOW UP TESTING     OF ANTITHROMBIN III LEVELS.  CBC     Status: Abnormal   Collection Time    09/22/12  5:21 AM      Result Value Range   WBC 4.5  4.0 - 10.5 K/uL   RBC 4.96  4.22 - 5.81 MIL/uL   Hemoglobin  14.9  13.0 - 17.0 g/dL   HCT 91.4  78.2 - 95.6 %   MCV 92.5  78.0 - 100.0 fL   MCH 30.0  26.0 - 34.0 pg   MCHC 32.5  30.0 - 36.0 g/dL   RDW 21.3  08.6 - 57.8 %   Platelets 138 (*) 150 - 400 K/uL  BASIC METABOLIC PANEL     Status: Abnormal   Collection Time    09/22/12  5:21 AM      Result Value Range   Sodium 139  135 - 145 mEq/L   Potassium 3.5  3.5 - 5.1 mEq/L   Chloride 97  96 - 112 mEq/L   CO2 35 (*) 19 - 32 mEq/L   Glucose, Bld 101 (*) 70 - 99 mg/dL   BUN 22  6 - 23 mg/dL   Creatinine, Ser 1.61  0.50 - 1.35 mg/dL   Calcium 8.1 (*) 8.4 - 10.5 mg/dL   GFR calc non Af Amer 86 (*) >90 mL/min   GFR calc Af Amer >90  >90 mL/min   Comment:            The eGFR has been calculated     using the CKD EPI equation.     This calculation has not been     validated in all clinical     situations.     eGFR's persistently     <90 mL/min signify     possible Chronic Kidney Disease.  HEPARIN LEVEL (UNFRACTIONATED)     Status: None   Collection Time    09/23/12  3:45 AM      Result Value Range   Heparin Unfractionated 0.55  0.30 - 0.70 IU/mL   Comment:            IF HEPARIN RESULTS ARE BELOW     EXPECTED VALUES, AND PATIENT     DOSAGE HAS BEEN CONFIRMED,     SUGGEST FOLLOW UP TESTING     OF ANTITHROMBIN III LEVELS.  CBC     Status: Abnormal   Collection Time    09/23/12  3:45 AM      Result Value Range   WBC 4.8  4.0 - 10.5 K/uL   RBC 4.85  4.22 - 5.81 MIL/uL   Hemoglobin 14.6  13.0 - 17.0 g/dL   HCT 09.6  04.5 - 40.9 %   MCV 93.0  78.0 - 100.0 fL   MCH 30.1  26.0 - 34.0 pg   MCHC 32.4  30.0 - 36.0 g/dL   RDW 81.1  91.4 - 78.2 %   Platelets 137 (*) 150 - 400 K/uL  BASIC METABOLIC PANEL     Status: Abnormal   Collection Time    09/23/12  3:45 AM      Result Value Range   Sodium 140  135 - 145 mEq/L   Potassium 3.5  3.5 - 5.1 mEq/L   Chloride 98  96 - 112 mEq/L   CO2 38 (*) 19 - 32 mEq/L   Glucose, Bld 98  70 - 99 mg/dL   BUN 23  6 - 23 mg/dL   Creatinine, Ser 9.56   0.50 - 1.35 mg/dL   Calcium 8.4  8.4 - 21.3 mg/dL   GFR calc non Af Amer 88 (*) >90 mL/min   GFR calc Af Amer >90  >90 mL/min   Comment:            The eGFR has been calculated     using the CKD EPI equation.     This calculation has not been     validated in all clinical     situations.     eGFR's persistently     <90 mL/min signify     possible Chronic Kidney Disease.    MICRO: mrsa pcr screen negative IMAGING: Dg Chest 2 View  09/23/2012   *RADIOLOGY REPORT*  Clinical Data: Congestive heart failure  CHEST - 2 VIEW  Comparison: Sep 17, 2012.  Findings: Stable mild cardiomegaly.  Bilateral perihilar and basilar interstitial edema is again noted but improved compared to  prior exam.  Mild bilateral pleural effusions are again noted. Bony thorax appears intact.  IMPRESSION: Slightly improved bilateral pulmonary edema with persistent small bilateral pleural effusions.   Original Report Authenticated By: Lupita Raider.,  M.D.    Assessment/Plan:  62yo M with CAD, presented with CHF and worsening lower extremity edema being evaluated for aortic valve surgery with possible MAZE. His presentation of lower extremity edema with erythema was thought to be cellulitis, improved with antibiotics and diuresis.  - would change keflex to 500mg  QID instead of BID and treat for an additional 8 days to finish 14 day course of therapy. Last day of antibiotics would be June 10th. - since he is now on day 6 of antibiotics, it appears completely resolved, he does have some residual pitting edema and non-blanching petechaie on lower extremities. Not sure if he had only minor case of cellulitis, but appears to have resolved. - this should not preclude him from getting AVR   Thank you for consultation. Call if questions  Aram Beecham B. Drue Second MD MPH [facility] [phone]

## 2012-09-23 NOTE — Progress Notes (Signed)
CARDIAC REHAB PHASE I   PRE:  Rate/Rhythm: 115-126 afib    BP: lying 82/70, sitting 81/56, standing 79/62    SaO2: 88-90 RA  MODE:  Ambulation: to chair  POST:  Rate/Rhythm: 130s    BP: sitting 94/56     SaO2: 88-90 2L  Planned to hold ambulation due to low BP and increased HR. Assisted to recliner. Pre-op ed completed. After sitting in recliner several minutes BP increased so decided to walk (see below). Pt used RW, assist x1. Tires easily with some weakness. Return to recliner. HR stable in 130s although hit 143 upon sitting after walk. BP stable. SaO2 hard to register but low. Needs O2.   PRE:  BP: sitting 102/73, standing 101/86  MODE: Ambulation: 170 ft  POST:  HR: 130s-143 afib    BP: sitting 103/73     SaO2: 88 2L  1100-1214 Elissa Lovett Guthrie CES, [place] 09/23/2012 12:08 PM

## 2012-09-23 NOTE — Progress Notes (Signed)
Pt. Seen and examined. Agree with the NP/PA-C note as written.  Agree with more frequent IV lopressor dosing for rate control. He is diuresing nicely - plan to optimize for heart surgery in the near future. Replete potassium.  Chrystie Nose, MD, [facility] Attending Cardiologist [facility]

## 2012-09-23 NOTE — Evaluation (Signed)
Physical Therapy Evaluation Patient Details Name: [name] MRN: [id number] DOB: 05/03/1950 Today's Date: 09/23/2012 Time: 4098-1191 PT Time Calculation (min): 19 min  PT Assessment / Plan / Recommendation Clinical Impression  Pt is a 62 yo male with acute respiratory failure. Pt with supervision for bed mobility and requires minG to ambulate with RW but cardiopulmonary status limiting OOB activity progression. Pt would benefit from acute PT to improve transfer independence and increase activity tolerance. Defer d/c planning at this time to await medical treatment recommendations.     PT Assessment  Patient needs continued PT services    Follow Up Recommendations  Other (comment) (to be determined)    Does the patient have the potential to tolerate intense rehabilitation      Barriers to Discharge        Equipment Recommendations  Rolling walker with 5" wheels    Recommendations for Other Services     Frequency Min 3X/week    Precautions / Restrictions Restrictions Weight Bearing Restrictions: No   Pertinent Vitals/Pain 0/10      Mobility  Bed Mobility Bed Mobility: Supine to Sit;Sitting - Scoot to Edge of Bed Supine to Sit: 6: Modified independent (Device/Increase time);HOB elevated;With rails Sitting - Scoot to Edge of Bed: 6: Modified independent (Device/Increase time) Transfers Transfers: Stand to Sit;Sit to Stand Sit to Stand: 5: Supervision;From bed Stand to Sit: 5: Supervision;To chair/3-in-1 Ambulation/Gait Ambulation/Gait Assistance: 4: Min guard Ambulation Distance (Feet): 200 Feet Assistive device: Rolling walker Ambulation/Gait Assistance Details: Min G for eval Gait Pattern: Step-through pattern;Decreased stride length Gait velocity: decreased per pt report General Gait Details: Pt with increased fatigue and asking to return to room. Pt BP 99/61 following ambulation, pt with no dizziness or light headedness Stairs: No    Exercises     PT  Diagnosis: Difficulty walking  PT Problem List: Decreased activity tolerance;Cardiopulmonary status limiting activity PT Treatment Interventions: DME instruction;Gait training;Stair training;Functional mobility training;Therapeutic activities;Therapeutic exercise;Balance training;Neuromuscular re-education   PT Goals Acute Rehab PT Goals PT Goal Formulation: With patient Time For Goal Achievement: 10/07/12 Potential to Achieve Goals: Good Pt will go Supine/Side to Sit: Independently PT Goal: Supine/Side to Sit - Progress: Goal set today Pt will go Sit to Stand: Independently PT Goal: Sit to Stand - Progress: Goal set today Pt will go Stand to Sit: Independently PT Goal: Stand to Sit - Progress: Goal set today Pt will Stand: Independently PT Goal: Stand - Progress: Goal set today Pt will Ambulate: >150 feet;with least restrictive assistive device;with modified independence PT Goal: Ambulate - Progress: Goal set today Pt will Go Up / Down Stairs: 1-2 stairs;with modified independence;with least restrictive assistive device PT Goal: Up/Down Stairs - Progress: Goal set today  Visit Information  Last PT Received On: 09/23/12 Assistance Needed: +1    Subjective Data  Subjective: Pt recieved in bed, agreeable to PT but asks to be returned to bed after because he has been sitting up today   Prior Functioning  Home Living Lives With: Alone Available Help at Discharge: Family;Available 24 hours/day Type of Home: Apartment Home Access: Stairs to enter Entrance Stairs-Number of Steps: 2 Entrance Stairs-Rails: None Home Layout: One level Bathroom Shower/Tub: Engineer, manufacturing systems: Standard Bathroom Accessibility: No Home Adaptive Equipment: Walker - rolling Prior Function Level of Independence: Independent Able to Take Stairs?: Yes Driving: Yes Vocation: Retired Musician: No difficulties Dominant Hand: Right    Cognition   Cognition Arousal/Alertness: Awake/alert Behavior During Therapy: WFL for tasks assessed/performed Overall  Cognitive Status: Within Functional Limits for tasks assessed    Extremity/Trunk Assessment Right Upper Extremity Assessment RUE ROM/Strength/Tone: Within functional levels Left Upper Extremity Assessment LUE ROM/Strength/Tone: Within functional levels Right Lower Extremity Assessment RLE ROM/Strength/Tone: Within functional levels Left Lower Extremity Assessment LLE ROM/Strength/Tone: Within functional levels Trunk Assessment Trunk Assessment: Normal   Balance Balance Balance Assessed: Yes Static Sitting Balance Static Sitting - Balance Support: Bilateral upper extremity supported Static Sitting - Level of Assistance: 7: Independent Static Sitting - Comment/# of Minutes: 2  End of Session PT - End of Session Equipment Utilized During Treatment: Gait belt;Oxygen Activity Tolerance: Patient limited by fatigue Patient left: in bed;with call bell/phone within reach;with family/visitor present Nurse Communication:  (BP)  GP     09/23/2012, 4:08 PM Marvis Moeller, Student Physical Therapist Office #: [phone]

## 2012-09-23 NOTE — Consult Note (Signed)
PULMONARY  / CRITICAL CARE MEDICINE  Name: [name] MRN: [id number] DOB: 1950/08/27    ADMISSION DATE:  09/16/2012 CONSULTATION DATE:  09/23/12  REFERRING MD :  Dr. Tresa Endo / Dr. Lorenso Courier PRIMARY SERVICE:  Dr. Tresa Endo  CHIEF COMPLAINT:  Pre-Op AVR Pulmonary Clearance  BRIEF [name] DESCRIPTION:  62 y/o M, smoker, with minimal medical compliance admitted 5/26 1 month hx of worsening SOB, BLE swelling.  Found to have severe AS pending AVR.    SIGNIFICANT EVENTS / STUDIES:  5/26 - Admit with SOB, LE swelling, severe AS on ECHO  LINES / TUBES:   CULTURES:   ANTIBIOTICS: Ancef 5/28>>>5/30 Keflex  5/30>>>  HISTORY OF PRESENT ILLNESS: 62 y/o M, smoker, with PMH of HTN, Gout, Afib, HTN, HLD, CHF, moderate AS by ECHO in 2010 and minimal medical compliance admitted 5/26 1 month hx of worsening SOB, BLE swelling.  [name] reports 6 months ago he was able to ambulate 200 yards before a rest break.  Approximately one month ago he began having increased swelling in his lower extremities.  He had been medically compliant with medications (lotensin HCTZ, lopressor) up to two months prior to admit.  He ran out of medications and did not get a refill due to cost.  Also noted lower extremity redness.    Presented to [facility] ER on 5/26 and was found to be volume overloaded, moderate respiratory distress requiring bipap and in Afib with RVR.  Repeat ECHO demonstrated EF of 30-35%, diffuse hypokinesis, severe AS, LA dilation, PA pressure 41.  [name] was aggressively diuresed and improved with BiPAP.  Rx'd with abx for LE cellulitis.  Admitted per Cardiology and planned for AVR per Dr. Tyrone Sage pending pulmonary clearance.    PAST MEDICAL HISTORY :  Past Medical History  Diagnosis Date  . Hypertension   . Gout   . Aortic stenosis     echo 06/19/08 with nomal LV function, moderate concentric LVH moderate aortic stenosis area 0.99 cm squared, peak gradient of 50 and mean of 31  . Atrial  fibrillation   . HTN (hypertension)   . Hyperlipidemia   . Atrial fibrillation with RVR, was in atrial fib in 04/2011 09/16/2012  . Tobacco use 09/16/2012  . Heart murmur   . Acute combined systolic and diastolic CHF, NYHA class 3 -- previous normal LV function and moderate AS- re-evaluate; EF now 30-35% with regional [facility] 09/16/2012  . Aortic stenosis, severe 09/23/2012  . CAD (coronary artery disease), single vessel disease 09/23/2012  . Cellulitis, lower extremity- treated 09/23/2012  . PAD (peripheral artery disease), decreased bil. ABIs 09/23/2012   Past Surgical History  Procedure Laterality Date  . Iliac artery stent  10/20/08    stent to lt iliac  . Aorto bifem bypass  12/18/08    by Dr. Myra Gianotti   Prior to Admission medications   Medication Sig Start Date End Date Taking? Authorizing Provider  aspirin 325 MG tablet Take 325 mg by mouth daily.   Yes Historical Provider, MD  benazepril (LOTENSIN) 20 MG tablet Take 20 mg by mouth daily.   Yes Historical Provider, MD  benazepril-hydrochlorthiazide (LOTENSIN HCT) 20-12.5 MG per tablet Take 2 tablets by mouth daily.   Yes Historical Provider, MD  metoprolol succinate (TOPROL-XL) 100 MG 24 hr tablet Take 100 mg by mouth daily. Take with or immediately following a meal.   Yes Historical Provider, MD  simvastatin (ZOCOR) 20 MG tablet Take 40 mg by mouth at bedtime.   Yes Historical  Provider, MD   No Known Allergies  FAMILY HISTORY:  Family History  Problem Relation Age of Onset  . Heart attack Father     died at 81 with MI  . Heart disease Brother    SOCIAL HISTORY:  reports that he has been smoking Cigarettes.  He has been smoking about 1.00 pack per day. He has never used smokeless tobacco. He reports that he drinks about 16.8 ounces of alcohol per week. He reports that he does not use illicit drugs.  REVIEW OF SYSTEMS:   Constitutional: Negative for fever, chills, weight loss, malaise/fatigue and diaphoresis.  HENT: Negative for hearing  loss, ear pain, nosebleeds, congestion, sore throat, neck pain, tinnitus and ear discharge.   Eyes: Negative for blurred vision, double vision, photophobia, pain, discharge and redness.  Respiratory: Negative for cough, hemoptysis, sputum production, wheezing and stridor.  SEE HPI for pertinent positives. Cardiovascular: Negative for chest pain, palpitations, orthopnea, claudication, leg swelling and PND.  Gastrointestinal: Negative for heartburn, nausea, vomiting, abdominal pain, diarrhea, constipation, blood in stool and melena.  Genitourinary: Negative for dysuria, urgency, frequency, hematuria and flank pain.  Musculoskeletal: Negative for myalgias, back pain, joint pain and falls.  Skin: Negative for itching and rash.  Bilateral lower extremity redness.  Neurological: Negative for dizziness, tingling, tremors, sensory change, speech change, focal weakness, seizures, loss of consciousness, weakness and headaches.  Endo/Heme/Allergies: Negative for environmental allergies and polydipsia. Does not bruise/bleed easily.  SUBJECTIVE:    VITAL SIGNS: Temp:  [97.6 F (36.4 C)-98.9 F (37.2 C)] 97.8 F (36.6 C) (06/02 1159) Pulse Rate:  [32-117] 101 (06/02 1159) Resp:  [18-33] 23 (06/02 1159) BP: (99-128)/(56-79) 103/73 mmHg (06/02 1159) SpO2:  [91 %-99 %] 93 % (06/02 1159) Weight:  [200 lb 6.4 oz (90.9 kg)] 200 lb 6.4 oz (90.9 kg) (06/02 0400)  PHYSICAL EXAMINATION: General:  wdwn adult male, appears older than stated age, no distress Neuro:  AAOx4, speech clear, MAE HEENT:  Fair dentition, mm pink/moist, no carotid bruits appreciated Cardiovascular:  IRIR, 3-4/6 SEM heard best at [facility] R Lungs: prolonged exp phase, BS diminished bilaterally, few faint crackles L lower posterior, no wheeze Abdomen:  Round/soft, tolerating PO's Ext: 1-2 + pitting edema, symmetric    Recent Labs Lab 09/21/12 0835 09/22/12 0521 09/23/12 0345  NA 140 139 140  K 3.7 3.5 3.5  CL 97 97 98  CO2 34* 35*  38*  BUN 20 22 23   CREATININE 0.93 0.97 0.93  GLUCOSE 101* 101* 98    Recent Labs Lab 09/21/12 0341 09/22/12 0521 09/23/12 0345  HGB 15.0 14.9 14.6  HCT 45.2 45.9 45.1  WBC 4.8 4.5 4.8  PLT 140* 138* 137*   Dg Chest 2 View  09/23/2012   *RADIOLOGY REPORT*  Clinical Data: Congestive heart failure  CHEST - 2 VIEW  Comparison: Sep 17, 2012.  Findings: Stable mild cardiomegaly.  Bilateral perihilar and basilar interstitial edema is again noted but improved compared to prior exam.  Mild bilateral pleural effusions are again noted. Bony thorax appears intact.  IMPRESSION: Slightly improved bilateral pulmonary edema with persistent small bilateral pleural effusions.   Original Report Authenticated By: Lupita Raider.,  M.D.   Spirometry: very severe obstruction with FEV1 0.53 liters  ASSESSMENT / PLAN:  COPD  Pre-Op Pulmonary Clearance  62 y/o M, current smoker (43+ years with up to 2 1/2ppd, currently 1ppd) admitted for worsening SOB / LE swelling.  Work up with ECHO noting severe AS.  Presumed COPD with  smoking history.  PFT's currently pending but likely to show severe obstructive disease.  Optimally, would be best to allow for 6-8 weeks without smoking before surgery.  He is high risk for any surgical procedure from pulmonary standpoint.  Have discussed with family and [name] that he may require prolonged ventilatory support post procedure.    Plan: -add scheduled BD's, levalbuterol preferentially over racemic albuterol in setting of AF -scheduled budesonide  -smoking cessation -will need ambulatory desaturation test prior to discharge (91-92% at rest prior to oxygen, suspect element of hypoxemia at baseline)    Aortic Stenosis HTN HLD PVD Plan: -Will follow up with CVTS / Cardiology regarding timing of surgery.   -HTN & diuresis per Cardiology  Cellulitis  Plan: -keflex per ID   Canary Brim, NP-C [facility] Pgr: [phone] or [phone]  PCCM  Attending: I have interviewed and examined the [name] and reviewed the database. I have formulated the assessment and plan as reflected in the note above with amendments made by me.   Mr Weckerly has very severe obstructive lung disease by spirometry. This is on the basis of long standing smoking and COPD. It is unlikely that there is much reversibility here. As such he will by at very high risk of peri-operative pulmonary complications including prolonged ventilator dependence, pneumonia and even death. Unfortunately, there are few good options in the management of severe AS other than valve replacement.   The only thing that has been shown to reduce peri-operative risk in this setting is smoking cessation for at least 6 weeks prior to surgery, if possible. It is not clear to me whether this would be a possibility for Mr Tennis but if it is felt that he can be supported medically for that long, we should give some thought to this option.   His pulmonary edema appears to be much improved with only residual interstitial edema on current CXR.   RECS: 1) nebulized bronchodilators - levalbuterol, ipratropium bromide 2) nebulized steroids 3) no systemic steroids in absence of wheezing 4) Smoking cessation 5) Consider postponing valve replacement surgery for 6 wks (with concomitant abstinence from smoking) to reduce peri-operative pulmonary risk if possible - will discuss with Dr Rennis Golden and Dr Tyrone Sage 6) regardless of ultimate timing of surgery, PCCM service would like to participate in his peri-operative care  Billy Fischer, MD;  PCCM service; Mobile [phone]   09/23/2012, 3:33 PM

## 2012-09-23 NOTE — Progress Notes (Signed)
[facility]   Subjective: HR continues elevated 110 to 130, a fib.  No chest pain, no SOB, no constipation.  Objective: Vital signs in last 24 hours: Temp:  [97.6 F (36.4 C)-98.9 F (37.2 C)] 97.8 F (36.6 C) (06/02 0750) Pulse Rate:  [32-117] 117 (06/02 0908) Resp:  [18-33] 29 (06/02 0750) BP: (99-128)/(56-79) 111/70 mmHg (06/02 0750) SpO2:  [91 %-100 %] 98 % (06/02 0750) Weight:  [200 lb 6.4 oz (90.9 kg)] 200 lb 6.4 oz (90.9 kg) (06/02 0400) Weight change: -6 lb 2.8 oz (-2.8 kg) Last BM Date: 09/21/12 Intake/Output from previous day: -1217 (total since admit -96045) wt 200.6 down 6 lbs since yesterday and down from 230 since admit.   06/01 0701 - 06/02 0700 In: 1008 [P.O.:660; I.V.:348] Out: 2425 [Urine:2425] Intake/Output this shift: Total I/O In: 214.5 [P.O.:200; I.V.:14.5] Out: -   PE: General:Pleasant affect, NAD Skin:Warm and dry, brisk capillary refill HEENT:normocephalic, sclera clear, mucus membranes moist Neck:supple, no JVD, no bruits  Heart:S1S2 RRR with 3/6 systolic murmur, no gallup, rub or click Lungs: + bibasilar  rales, no rhonchi, or wheezes, no productive cough WUJ:WJXB, non tender, + BS, do not palpate liver spleen or masses JYN:WGNFA with 1-2 lower ext edema though much improved from admit with edema to waist.  Neuro:alert and oriented, MAE, follows commands      Lab Results:  Recent Labs  09/22/12 0521 09/23/12 0345  WBC 4.5 4.8  HGB 14.9 14.6  HCT 45.9 45.1  PLT 138* 137*   BMET  Recent Labs  09/22/12 0521 09/23/12 0345  NA 139 140  K 3.5 3.5  CL 97 98  CO2 35* 38*  GLUCOSE 101* 98  BUN 22 23  CREATININE 0.97 0.93  CALCIUM 8.1* 8.4   No results found for this basename: TROPONINI, CK, MB,  in the last 72 hours  Lab Results  Component Value Date   CHOL 145 09/17/2012   HDL 47 09/17/2012   LDLCALC 83 09/17/2012   TRIG 77 09/17/2012   CHOLHDL 3.1 09/17/2012   Lab Results  Component Value Date   HGBA1C 5.4 09/17/2012     Lab Results  Component Value Date   TSH 1.994 09/17/2012        Studies/Results: No results found.  Medications: I have reviewed the patient's current medications. Scheduled Meds: . aspirin EC  81 mg Oral Daily  . atorvastatin  40 mg Oral q1800  . cephALEXin  500 mg Oral Q12H  . digoxin  0.125 mg Oral Daily  . furosemide  40 mg Intravenous BID  . metoprolol tartrate  25 mg Oral Q8H  . mirtazapine  15 mg Oral QHS  . pantoprazole  40 mg Oral Daily  . potassium chloride  20 mEq Oral Daily  . potassium chloride  20 mEq Oral Once   Continuous Infusions: . sodium chloride    . heparin 1,450 Units/hr (09/23/12 2130)   PRN Meds:.sodium chloride, acetaminophen, acetaminophen, LORazepam, morphine injection, nitroGLYCERIN, ondansetron (ZOFRAN) IV, ondansetron, ondansetron (ZOFRAN) IV  Assessment/Plan: Principal Problem:   Acute respiratory failure Active Problems:   Atrial fibrillation with RVR, was in atrial fib in 04/2011   Acute combined systolic and diastolic CHF, NYHA class 3 -- previous normal LV function and moderate AS- re-evaluate; EF now 30-35% with regional WMA   Aortic stenosis, severe   CAD (coronary artery disease), single vessel disease of RCA/PDA, mild disease of LM   HTN (hypertension)   Hyperlipidemia  Noncompliance   Tobacco use   Bilateral lower extremity edema, secondary to acute CHF   Cellulitis, lower extremity- treated   PAD (peripheral artery disease), decreased bil. ABIs  PLAN: Borderline K+-replace an extra 20 meq today, HR still rapid at rest. Increase lopressor to 25 every 8 hours.  Continues on Lasix 40 mg IV BID.   Overall HF improved. For surgery once he is stable.   LOS: 7 days   Time spent with pt. :  20 minutes. [facility] R  Nurse Practitioner Certified Pager [phone] 09/23/2012, 9:22 AM

## 2012-09-23 NOTE — Progress Notes (Signed)
PFT done bedside, unconfirmed copy placed in chart

## 2012-09-23 NOTE — Progress Notes (Signed)
ANTICOAGULATION CONSULT NOTE - Follow Up Consult  Pharmacy Consult for Heparin Indication: atrial fibrillation  No Known Allergies  Patient Measurements: Height: 5\' 10"  (177.8 cm) Weight: 200 lb 6.4 oz (90.9 kg) IBW/kg (Calculated) : 73 Heparin Dosing Weight:   Vital Signs: Temp: 97.8 F (36.6 C) (06/02 0750) Temp src: Oral (06/02 0750) BP: 111/70 mmHg (06/02 0750) Pulse Rate: 86 (06/02 0750)  Labs:  Recent Labs  09/21/12 0341  09/21/12 0835 09/21/12 1655 09/22/12 0521 09/23/12 0345  HGB 15.0  --   --   --  14.9 14.6  HCT 45.2  --   --   --  45.9 45.1  PLT 140*  --   --   --  138* 137*  HEPARINUNFRC  --   < > 0.28* 0.39 0.50 0.55  CREATININE  --   --  0.93  --  0.97 0.93  < > = values in this interval not displayed.  Estimated Creatinine Clearance: 92.2 ml/min (by C-G formula based on Cr of 0.93).   Assessment: Progressively worsening SOB, bilateral LE edema, palpitations. In Afib/RVR, out of medications for > 1 month. Trop 0.09, BNP 32487  Anticoagulation - Heparin per Rx for afib. Begun 5/26 at 20:32. Dopplers neg for DVT. S/p cath 5/30.Will need 1V CABG/AVR +/- surgical MAZE.  Heparin level 0.55 this am in goal. CBC stable.  Infectious Disease -Afebrile, WBC 4.8. PO Keflex 5/30, plan 7 days for LE cellulitis. Ancef 5/28>>5/29 Cephalexin 5/30>>(6/4)  5/27 - MRSA screen neg  Cardiovascular- HTN, chronic afib, Hx L iliac stent, Aofem BPG. EF 30-35%. Meds: ASA 81mg , Lipitor, digoxin, IV Lasix, metoprolol, K  Endocrinology -A1C 5.4, glucose 101, TSH ok Gastrointestinal / Nutrition - LFTs ok, PO PPI Neurology - Ativan prn, Remeron qhs Nephrology -Scr stable at 0.93, lytes ok.  Pulmonary - smoker. SOB on admit, on 2L [place] 98% Hematology / Oncology - Hgb wnl, plts 138 trending down  PTA Medication Issues - hx non-compliance, out of meds for 1-2 months; Lotensin-HCT (20-12.5) + plain Lotensin (20),  Best Practices: IV heparin, SCDs, PO PPI   Goal of Therapy:   Heparin level 0.3-0.7 units/ml Monitor platelets by anticoagulation protocol: Yes   Plan:  - Continue heparin to 1450 units/hr - daily HL and CBC - f/u ACEi for low EF     Eann Cleland S. Merilynn Finland, PharmD, BCPS Clinical Staff Pharmacist Pager [phone] Misty Stanley Stillinger 09/23/2012,8:54 AM

## 2012-09-24 DIAGNOSIS — Z01810 Encounter for preprocedural cardiovascular examination: Secondary | ICD-10-CM

## 2012-09-24 DIAGNOSIS — I739 Peripheral vascular disease, unspecified: Secondary | ICD-10-CM

## 2012-09-24 LAB — HEPARIN LEVEL (UNFRACTIONATED)
Heparin Unfractionated: 0.95 IU/mL — ABNORMAL HIGH (ref 0.30–0.70)
Heparin Unfractionated: 0.72 IU/mL — ABNORMAL HIGH (ref 0.30–0.70)

## 2012-09-24 LAB — CBC
Hemoglobin: 14.3 g/dL (ref 13.0–17.0)
MCHC: 31.9 g/dL (ref 30.0–36.0)
RBC: 4.79 MIL/uL (ref 4.22–5.81)
WBC: 4.6 10*3/uL (ref 4.0–10.5)

## 2012-09-24 LAB — BASIC METABOLIC PANEL
GFR calc non Af Amer: 68 mL/min — ABNORMAL LOW (ref >90)
Glucose, Bld: 104 mg/dL — ABNORMAL HIGH (ref 70–99)
Potassium: 3.9 mEq/L (ref 3.5–5.1)
Sodium: 140 mEq/L (ref 135–145)

## 2012-09-24 MED ORDER — LEVALBUTEROL HCL 0.63 MG/3ML IN NEBU
0.6300 mg | INHALATION_SOLUTION | Freq: Three times a day (TID) | RESPIRATORY_TRACT | Status: DC
  Administered 2012-09-24 – 2012-09-27 (×8): 0.63 mg via RESPIRATORY_TRACT
  Filled 2012-09-24 (×16): qty 3

## 2012-09-24 MED ORDER — BUDESONIDE 0.5 MG/2ML IN SUSP
0.5000 mg | Freq: Two times a day (BID) | RESPIRATORY_TRACT | Status: DC
  Administered 2012-09-24 – 2012-10-07 (×25): 0.5 mg via RESPIRATORY_TRACT
  Filled 2012-09-24 (×31): qty 2

## 2012-09-24 MED ORDER — IPRATROPIUM BROMIDE 0.02 % IN SOLN
0.5000 mg | Freq: Three times a day (TID) | RESPIRATORY_TRACT | Status: DC
  Administered 2012-09-24 – 2012-09-27 (×9): 0.5 mg via RESPIRATORY_TRACT
  Filled 2012-09-24 (×10): qty 2.5

## 2012-09-24 MED ORDER — LEVALBUTEROL HCL 0.63 MG/3ML IN NEBU
0.6300 mg | INHALATION_SOLUTION | RESPIRATORY_TRACT | Status: DC | PRN
  Filled 2012-09-24 (×3): qty 3

## 2012-09-24 NOTE — Progress Notes (Signed)
CARDIAC REHAB PHASE I   PRE:  Rate/Rhythm: 94 afib    BP: sitting 108/85    SaO2: 93 2L  MODE:  Ambulation: 270 ft   POST:  Rate/Rhythm: 150 afib (brief)    BP: sitting 111/77     SaO2: wouldn't register on 3L  Pt increased distance. Tolerated fairly well. HR up to 150 trying to get back in bed. Walking rate in 120s. Used RW and 3L O2 however could not get SaO2 after walk. To bed for rest.  [id number]  Elissa Lovett [place]iss CES, ACSM 09/24/2012 2:44 PM

## 2012-09-24 NOTE — Progress Notes (Signed)
[facility]  Subjective: No complaints. Breathing comfortably, but still using [place]. No CP, palpitations, lightheadedness/dizziness.  Objective: Vital signs in last 24 hours: Temp:  [97.4 F (36.3 C)-98.1 F (36.7 C)] 97.4 F (36.3 C) (06/03 0345) Pulse Rate:  [86-117] 90 (06/03 0730) Resp:  [22-29] 27 (06/03 0730) BP: (92-130)/(60-79) 114/78 mmHg (06/03 0730) SpO2:  [92 %-99 %] 98 % (06/03 0730) Weight:  [199 lb 11.8 oz (90.6 kg)] 199 lb 11.8 oz (90.6 kg) (06/03 0500) Last BM Date: 09/23/12  Intake/Output from previous day: 06/02 0701 - 06/03 0700 In: 900.5 [P.O.:570; I.V.:330.5] Out: 1550 [Urine:1550] Intake/Output this shift:    Medications Current Facility-Administered Medications  Medication Dose Route Frequency Provider Last Rate Last Dose  . 0.9 %  sodium chloride infusion   Intravenous Continuous PRN Lennette Bihari, MD      . acetaminophen (TYLENOL) tablet 500 mg  500 mg Oral Q6H PRN Runell Gess, MD   500 mg at 09/21/12 1951  . acetaminophen (TYLENOL) tablet 650 mg  650 mg Oral Q4H PRN Runell Gess, MD   650 mg at 09/23/12 2107  . aspirin EC tablet 81 mg  81 mg Oral Daily Nada Boozer, NP   81 mg at 09/23/12 0908  . atorvastatin (LIPITOR) tablet 40 mg  40 mg Oral q1800 Nada Boozer, NP   40 mg at 09/23/12 1740  . budesonide (PULMICORT) nebulizer solution 0.5 mg  0.5 mg Nebulization BID Jeanella Craze, NP   0.5 mg at 09/23/12 2012  . cephALEXin (KEFLEX) capsule 500 mg  500 mg Oral Q6H Judyann Munson, MD   500 mg at 09/24/12 0981  . digoxin (LANOXIN) tablet 0.125 mg  0.125 mg Oral Daily Marykay Lex, MD   0.125 mg at 09/23/12 0908  . furosemide (LASIX) injection 40 mg  40 mg Intravenous BID Wilburt Finlay, PA-C   40 mg at 09/23/12 1740  . heparin ADULT infusion 100 units/mL (25000 units/250 mL)  1,300 Units/hr Intravenous Continuous Lennette Bihari, MD 13 mL/hr at 09/24/12 0500 1,300 Units/hr at 09/24/12 0500  . ipratropium (ATROVENT)  nebulizer solution 0.5 mg  0.5 mg Nebulization Q6H Lennette Bihari, MD   0.5 mg at 09/24/12 0027  . levalbuterol (XOPENEX) nebulizer solution 1.25 mg  1.25 mg Nebulization Q6H Lennette Bihari, MD   1.25 mg at 09/24/12 0027  . LORazepam (ATIVAN) tablet 0.5 mg  0.5 mg Oral Q4H PRN Nada Boozer, NP   0.5 mg at 09/23/12 2107  . metoprolol tartrate (LOPRESSOR) tablet 25 mg  25 mg Oral Q8H Nada Boozer, NP   25 mg at 09/24/12 1914  . mirtazapine (REMERON) tablet 15 mg  15 mg Oral QHS Lennette Bihari, MD   15 mg at 09/23/12 2106  . morphine 2 MG/ML injection 2 mg  2 mg Intravenous Q1H PRN Runell Gess, MD      . nitroGLYCERIN (NITROSTAT) SL tablet 0.4 mg  0.4 mg Sublingual Q5 Min x 3 PRN Nada Boozer, NP      . ondansetron [facility]) injection 4 mg  4 mg Intravenous Q6H PRN Nada Boozer, NP      . ondansetron [facility]) injection 4 mg  4 mg Intravenous Q6H PRN Nada Boozer, NP      . ondansetron [facility]) injection 4 mg  4 mg Intravenous Q6H PRN Runell Gess, MD      . pantoprazole (PROTONIX) EC tablet 40 mg  40 mg Oral Daily Vernona Rieger  [place], NP   40 mg at 09/23/12 0909  . potassium chloride SA (K-DUR,KLOR-CON) CR tablet 20 mEq  20 mEq Oral Daily Runell Gess, MD   20 mEq at 09/23/12 0908    PE: General appearance: alert, cooperative and no distress Lungs: bibasilar rales Heart: irregularly irregular rhythm Extremities: 1+ bilateral LEE Pulses: 2+ and symmetric Skin: warm and dry Neurologic: Grossly normal  Lab Results:   Recent Labs  09/22/12 0521 09/23/12 0345 09/24/12 0354  WBC 4.5 4.8 4.6  HGB 14.9 14.6 14.3  HCT 45.9 45.1 44.8  PLT 138* 137* 136*   BMET  Recent Labs  09/22/12 0521 09/23/12 0345 09/24/12 0354  NA 139 140 140  K 3.5 3.5 3.9  CL 97 98 98  CO2 35* 38* 36*  GLUCOSE 101* 98 104*  BUN 22 23 22   CREATININE 0.97 0.93 1.12  CALCIUM 8.1* 8.4 8.6    Studies/Results:   CXR 09/23/12 *RADIOLOGY REPORT*  Clinical Data: Congestive heart failure  CHEST - 2 VIEW   Comparison: Sep 17, 2012.  Findings: Stable mild cardiomegaly. Bilateral perihilar and  basilar interstitial edema is again noted but improved compared to  prior exam. Mild bilateral pleural effusions are again noted.  Bony thorax appears intact.  IMPRESSION:  Slightly improved bilateral pulmonary edema with persistent small  bilateral pleural effusions.  Original Report Authenticated By: Lupita Raider., M.D.   Assessment/Plan   Principal Problem:   Acute respiratory failure Active Problems:   Atrial fibrillation with RVR, was in atrial fib in 04/2011   HTN (hypertension)   Hyperlipidemia   Noncompliance   Tobacco use   Acute combined systolic and diastolic CHF, NYHA class 3 -- previous normal LV function and moderate AS- re-evaluate; EF now 30-35% with regional WMA   Bilateral lower extremity edema, secondary to acute CHF   Aortic stenosis, severe   CAD (coronary artery disease), single vessel disease of RCA/PDA, mild disease of LM   Cellulitis, lower extremity- treated   PAD (peripheral artery disease), decreased bil. ABIs   Chronic obstructive pulmonary disease   Pre-operative respiratory examination  Plan: Pt seen and examined yesterday by Pulmonology for pre-operative recommendations. Dr. Sung Amabile recommends postponing valve replacement surgery for 6 wks (with concomitant abstinence from smoking) to reduce peri-operative pulmonary risk if possible. Will need to consider this along with Dr. Tyrone Sage and Dr. Allyson Sabal. Pt's HR is much improved after increase in BB. His a-fib is controlled in the 70s-80s.  He did have subsequent hypotension last night, but was asymptomatic. His BP is now stable w/ most recent BP of 114/78. Will continue to monitor. Continue on IV heparin for AC. CXR yesterday demonstrated bilateral perihilar and basilar interstitial edema, although improved compared to prior exam. Pt still has 1+ bilateral peripheral edema.  Continue with IV Lasix for CHF.  Renal  function is stable w/ SCr of 1.12. K+ is WNL.      LOS: 8 days    Orpha Dain M. Delmer Islam 09/24/2012 7:46 AM

## 2012-09-24 NOTE — Progress Notes (Signed)
ANTICOAGULATION CONSULT NOTE   Pharmacy Consult for Heparin Indication: atrial fibrillation  No Known Allergies  Patient Measurements: Height: 5\' 10"  (177.8 cm) Weight: 200 lb 6.4 oz (90.9 kg) IBW/kg (Calculated) : 73 Heparin Dosing Weight:   Vital Signs: Temp: 97.4 F (36.3 C) (06/03 0345) Temp src: Oral (06/03 0345) BP: 119/76 mmHg (06/03 0345) Pulse Rate: 95 (06/03 0345)  Labs:  Recent Labs  09/21/12 0835  09/22/12 0521 09/23/12 0345 09/24/12 0354  HGB  --   < > 14.9 14.6 14.3  HCT  --   --  45.9 45.1 44.8  PLT  --   --  138* 137* 136*  HEPARINUNFRC 0.28*  < > 0.50 0.55 0.95*  CREATININE 0.93  --  0.97 0.93  --   < > = values in this interval not displayed.  Estimated Creatinine Clearance: 92.2 ml/min (by C-G formula based on Cr of 0.93).   Assessment: 62 yo male with Afib for heparin  Goal of Therapy:  Heparin level 0.3-0.7 units/ml Monitor platelets by anticoagulation protocol: Yes   Plan:  Decrease heparin 1300 units/hr Check heparin level in 8 hours.  -[name], Gary Fleet 09/24/2012,4:51 AM

## 2012-09-24 NOTE — Progress Notes (Signed)
ANTICOAGULATION CONSULT NOTE - Follow Up Consult  Pharmacy Consult for Heparin Indication: atrial fibrillation  No Known Allergies  Patient Measurements: Height: 5\' 10"  (177.8 cm) Weight: 199 lb 11.8 oz (90.6 kg) IBW/kg (Calculated) : 73 Heparin Dosing Weight:   Vital Signs: Temp: 98.6 F (37 C) (06/03 1145) Temp src: Oral (06/03 1145) BP: 108/85 mmHg (06/03 1403) Pulse Rate: 93 (06/03 1403)  Labs:  Recent Labs  09/22/12 0521 09/23/12 0345 09/24/12 0354 09/24/12 1403  HGB 14.9 14.6 14.3  --   HCT 45.9 45.1 44.8  --   PLT 138* 137* 136*  --   HEPARINUNFRC 0.50 0.55 0.95* 0.72*  CREATININE 0.97 0.93 1.12  --     Estimated Creatinine Clearance: 76.4 ml/min (by C-G formula based on Cr of 1.12).   Assessment: Progressively worsening SOB, bilateral LE edema, palpitations. In Afib/RVR, out of medications for > 1 month. Trop 0.09, BNP 32487  Anticoagulation - Heparin per Rx for afib. Begun 5/26. Dopplers neg for DVT. S/p cath 5/30.Will need 1V CABG/AVR +/- surgical MAZE when pulmonary status improves (6 weeks).  Heparin level 0.95 this am now down to 0.72. CBC stable  Infectious Disease -Afebrile, WBC 4.6. PO Keflex 5/30, plan 7 days for LE cellulitis. Ancef 5/28>>5/29 Cephalexin 5/30>>(6/4) 5/27 - MRSA screen neg  Cardiovascular- HTN, chronic afib, Hx L iliac stent, Aofem BPG. EF 30-35%. Meds: ASA 81mg , Lipitor, digoxin, IV Lasix, metoprolol, K  Endocrinology -A1C 5.4, glucose 101, TSH ok  Gastrointestinal / Nutrition - LFTs ok, PO PPI  Neurology - Ativan prn, Remeron qhs  Nephrology -Scr up a little to 1.12.  Pulmonary - smoker. SOB on admit, on 2L [place] 98%  Hematology / Oncology - Hgb 14.3, Plts 136  PTA Medication Issues - hx non-compliance, out of meds for 1-2 months; Lotensin-HCT (20-12.5) + plain Lotensin (20),  Best Practices: IV heparin, SCDs, PO PPI   Goal of Therapy:  Heparin level 0.3-0.7 units/ml Monitor platelets by anticoagulation protocol:  Yes  Plan:  - Decrease heparin to 1200 units/hr - daily HL and CBC    [name], PharmD, BCPS Clinical Staff Pharmacist Pager [phone]  [name] 09/24/2012,2:49 PM

## 2012-09-24 NOTE — Progress Notes (Addendum)
Pt. Seen and examined. Agree with the NP/PA-C note as written.  Breathing is incrementally improved, but noting a very poor FEV1 yesterday. Discussed with Dr. Sung Amabile, he favors waiting 6 weeks for lung improvement before surgery. I agree that he is high risk from a pulmonary standpoint. Labs indicate no evidence for MI this admission - CHF is improving, BNP is now ~15K, down from ~32K.  I would continue IV lasix and nebulizer treatment per respiratory. Hopefully he will be able to successfully quit smoking permanently, however, he has not had much success with this in the past. Ultimately, the timing of operation is between Dr. Tyrone Sage and the patient.  A-fib is now better rate-controlled on higher dose metoprolol.   Chrystie Nose, MD, [facility]  Attending Cardiologist [facility]

## 2012-09-25 ENCOUNTER — Inpatient Hospital Stay (HOSPITAL_COMMUNITY): Payer: Medicaid Other

## 2012-09-25 DIAGNOSIS — I5041 Acute combined systolic (congestive) and diastolic (congestive) heart failure: Secondary | ICD-10-CM

## 2012-09-25 LAB — CBC
Hemoglobin: 13.7 g/dL (ref 13.0–17.0)
MCHC: 32.2 g/dL (ref 30.0–36.0)
RDW: 14.5 % (ref 11.5–15.5)

## 2012-09-25 LAB — HEPARIN LEVEL (UNFRACTIONATED): Heparin Unfractionated: 0.43 IU/mL (ref 0.30–0.70)

## 2012-09-25 LAB — BASIC METABOLIC PANEL
GFR calc Af Amer: 86 mL/min — ABNORMAL LOW (ref >90)
GFR calc non Af Amer: 74 mL/min — ABNORMAL LOW (ref >90)
Glucose, Bld: 112 mg/dL — ABNORMAL HIGH (ref 70–99)
Potassium: 3.8 mEq/L (ref 3.5–5.1)
Sodium: 140 mEq/L (ref 135–145)

## 2012-09-25 NOTE — Progress Notes (Signed)
Physical Therapy Treatment Patient Details Name: [name] MRN: [id number] DOB: 02-23-1951 Today's Date: 09/25/2012 Time: 4098-1191 PT Time Calculation (min): 15 min  PT Assessment / Plan / Recommendation Comments on Treatment Session  Pt is a 62 yo male with CHF awaiting CABG, however unknown as to when. Pt with improved transfer mobility evidenced by decreased assistance but pt continues to be fatigued with ambulation. Acute PT will continue to see to work on improving activity tolerance until surgery and will then re-eval when ordered by MD.    Follow Up Recommendations  Other (comment) (to be determined following CABG)     Does the patient have the potential to tolerate intense rehabilitation     Barriers to Discharge        Equipment Recommendations  Rolling walker with 5" wheels    Recommendations for Other Services    Frequency Min 3X/week   Plan Discharge plan remains appropriate    Precautions / Restrictions Restrictions Weight Bearing Restrictions: No   Pertinent Vitals/Pain Pt denies pain    Mobility  Bed Mobility Bed Mobility: Sit to Supine Sit to Supine: 7: Independent;HOB flat Transfers Sit to Stand: 5: Supervision;From chair/3-in-1 Stand to Sit: 5: Supervision;To bed Ambulation/Gait Ambulation/Gait Assistance: 5: Supervision Ambulation Distance (Feet): 400 Feet Assistive device: Rolling walker Ambulation/Gait Assistance Details: Pt supervision due to fatigue, pt reporting fatigue at 350 ft, HR 140 bpm Gait Pattern: Step-through pattern;Decreased stride length Gait velocity: decreased per pt report    Exercises     PT Diagnosis:    PT Problem List:   PT Treatment Interventions:     PT Goals Acute Rehab PT Goals PT Goal Formulation: With patient Time For Goal Achievement: 10/07/12 Potential to Achieve Goals: Good Pt will go Supine/Side to Sit: Independently PT Goal: Supine/Side to Sit - Progress: Progressing toward goal PT Goal: Sit to  Stand - Progress: Progressing toward goal PT Goal: Stand to Sit - Progress: Progressing toward goal PT Goal: Stand - Progress: Progressing toward goal PT Goal: Ambulate - Progress: Progressing toward goal PT Goal: Up/Down Stairs - Progress: Progressing toward goal  Visit Information  Last PT Received On: 10/09/12 Assistance Needed: +1    Subjective Data  Subjective: Pt recieved sitting in chair, ready to get back to bed, agreeable to walking with PT   Cognition  Cognition Arousal/Alertness: Awake/alert Behavior During Therapy: WFL for tasks assessed/performed Overall Cognitive Status: Within Functional Limits for tasks assessed    Balance     End of Session PT - End of Session Equipment Utilized During Treatment: Gait belt;Oxygen Activity Tolerance: Patient limited by fatigue Patient left: in bed;with call bell/phone within reach;with family/visitor present (MD in room)   GP     09/25/2012, 4:10 PM Marvis Moeller, Student Physical Therapist Office #: [phone]  Agree with above assessment.  Lewis Shock, PT, DPT Pager #: [phone] Office #: [phone]

## 2012-09-25 NOTE — Progress Notes (Signed)
ANTICOAGULATION CONSULT NOTE - Follow Up Consult  Pharmacy Consult for Heparin Indication: atrial fibrillation  No Known Allergies  Patient Measurements: Height: 5\' 10"  (177.8 cm) Weight: 200 lb 6.4 oz (90.9 kg) IBW/kg (Calculated) : 73 Heparin Dosing Weight: 85kg  Vital Signs: Temp: 98.5 F (36.9 C) (06/04 0736) Temp src: Oral (06/04 0736) BP: 99/62 mmHg (06/04 0736) Pulse Rate: 110 (06/04 0736)  Labs:  Recent Labs  09/23/12 0345 09/24/12 0354 09/24/12 1403 09/25/12 0340  HGB 14.6 14.3  --  13.7  HCT 45.1 44.8  --  42.6  PLT 137* 136*  --  143*  HEPARINUNFRC 0.55 0.95* 0.72* 0.43  CREATININE 0.93 1.12  --  1.04    Estimated Creatinine Clearance: 82.5 ml/min (by C-G formula based on Cr of 1.04).   Assessment: Progressively worsening SOB, bilateral LE edema, palpitations. In Afib/RVR, out of medications for > 1 month. Trop 0.09, BNP 32487  Anticoagulation - Heparin per Rx for afib. Begun 5/26. Dopplers neg for DVT. S/p cath 5/30.Will need 1V CABG/AVR +/- surgical MAZE when pulmonary status improves (6 weeks).  Heparin level now at goal 0.4. CBC trending down. No bleeding noted.  Infectious Disease -Afebrile, WBC 4.2. PO Keflex 5/30, plan 7 days for LE cellulitis. Ancef 5/28>>5/29 Cephalexin 5/30>>(6/4) 5/27 - MRSA screen neg  Cardiovascular- HTN, chronic afib, Hx L iliac stent, Aofem BPG. EF 30-35%. Meds: ASA 81mg , Lipitor, digoxin, IV Lasix, metoprolol, K Wt up 1lb, down ~500cc overnight  Endocrinology -A1C 5.4, glucose 112, TSH ok  Gastrointestinal / Nutrition - LFTs ok, PO PPI  Neurology - Ativan prn, Remeron qhs  Nephrology -Scr trending down to 1.0. [place] ok  Pulmonary - smoker. SOB on admit, on 2L [place] 98%  Hematology / Oncology - Hgb 13.7, Plts 143  PTA Medication Issues - hx non-compliance, out of meds for 1-2 months; Lotensin-HCT (20-12.5) + plain Lotensin (20),  Best Practices: IV heparin, SCDs, PO PPI   Goal of Therapy:  Heparin level  0.3-0.7 units/ml Monitor platelets by anticoagulation protocol: Yes  Plan:  - Continue heparin at 1200 units/hr - daily HL and CBC  Sheppard Coil PharmD., BCPS Clinical Pharmacist Pager [phone] 09/25/2012 9:08 AM

## 2012-09-25 NOTE — Progress Notes (Signed)
Subjective: No complaints, some SOB with ambulation.  Objective: Vital signs in last 24 hours: Temp:  [97.3 F (36.3 C)-98.8 F (37.1 C)] 98.5 F (36.9 C) (06/04 0736) Pulse Rate:  [76-115] 110 (06/04 0736) Resp:  [19-25] 23 (06/04 0736) BP: (84-108)/(47-85) 99/62 mmHg (06/04 0736) SpO2:  [90 %-98 %] 90 % (06/04 0736) Weight:  [200 lb 6.4 oz (90.9 kg)] 200 lb 6.4 oz (90.9 kg) (06/04 0500) Weight change: 10.6 oz (0.3 kg) Last BM Date: 09/24/12 Intake/Output from previous day: -454 (-14,608 since admit)   Wt 200.6 lbs, down from 230 on admit 06/03 0701 - 06/04 0700 In: 1060 [P.O.:790; I.V.:270] Out: 1525 [Urine:1525] Intake/Output this shift:    PE: General:Pleasant affect, NAD Skin:Warm and dry, brisk capillary refill HEENT:normocephalic, sclera clear, mucus membranes moist Neck:supple, no JVD sitting upright  Heart:irreg irreg with 3/6 systolic murmur loud squeak, gallup, rub or click Lungs:clear without rales, rhonchi, or wheezes BJY:NWGN, non tender, + BS, do not palpate liver spleen or masses Ext:+1-2 lower ext edema--much improved, continues with erythema  +2  radial pulses Neuro:alert and oriented, MAE, follows commands, + facial symmetry  TELE:  A fib now with waking in hall HR 163.  At rest HR was in 90s to 110.   Lab Results:  Recent Labs  09/24/12 0354 09/25/12 0340  WBC 4.6 4.2  HGB 14.3 13.7  HCT 44.8 42.6  PLT 136* 143*   BMET  Recent Labs  09/24/12 0354 09/25/12 0340  NA 140 140  K 3.9 3.8  CL 98 99  CO2 36* 35*  GLUCOSE 104* 112*  BUN 22 19  CREATININE 1.12 1.04  CALCIUM 8.6 8.5   No results found for this basename: TROPONINI, CK, MB,  in the last 72 hours  Lab Results  Component Value Date   CHOL 145 09/17/2012   HDL 47 09/17/2012   LDLCALC 83 09/17/2012   TRIG 77 09/17/2012   CHOLHDL 3.1 09/17/2012   Lab Results  Component Value Date   HGBA1C 5.4 09/17/2012     Lab Results  Component Value Date   TSH 1.994 09/17/2012        Studies/Results: Dg Chest 2 View  09/23/2012   *RADIOLOGY REPORT*  Clinical Data: Congestive heart failure  CHEST - 2 VIEW  Comparison: Sep 17, 2012.  Findings: Stable mild cardiomegaly.  Bilateral perihilar and basilar interstitial edema is again noted but improved compared to prior exam.  Mild bilateral pleural effusions are again noted. Bony thorax appears intact.  IMPRESSION: Slightly improved bilateral pulmonary edema with persistent small bilateral pleural effusions.   Original Report Authenticated By: Lupita Raider.,  M.D.    Medications: I have reviewed the patient's current medications. Scheduled Meds: . aspirin EC  81 mg Oral Daily  . atorvastatin  40 mg Oral q1800  . budesonide (PULMICORT) nebulizer solution  0.5 mg Nebulization BID  . cephALEXin  500 mg Oral Q6H  . digoxin  0.125 mg Oral Daily  . furosemide  40 mg Intravenous BID  . ipratropium  0.5 mg Nebulization Q8H  . levalbuterol  0.63 mg Nebulization Q8H  . metoprolol tartrate  25 mg Oral Q8H  . mirtazapine  15 mg Oral QHS  . potassium chloride  20 mEq Oral Daily   Continuous Infusions: . sodium chloride    . heparin 1,200 Units (09/24/12 1900)   PRN Meds:.sodium chloride, acetaminophen, levalbuterol, LORazepam, morphine injection, nitroGLYCERIN, ondansetron (ZOFRAN) IV  Assessment/Plan: Principal Problem:   Acute respiratory failure  Active Problems:   Atrial fibrillation with RVR, was in atrial fib in 04/2011   Acute combined systolic and diastolic CHF, NYHA class 3 -- previous normal LV function and moderate AS- re-evaluate; EF now 30-35% with regional WMA   Aortic stenosis, severe   CAD (coronary artery disease), single vessel disease of RCA/PDA, mild disease of LM   HTN (hypertension)   Hyperlipidemia   Noncompliance   Tobacco use   Bilateral lower extremity edema, secondary to acute CHF   Cellulitis, lower extremity- treated   PAD (peripheral artery disease), decreased bil. ABIs   Chronic obstructive  pulmonary disease   Pre-operative respiratory examination  PLAN: HR improved control, still in 120s with ambulation and at end yesterday up to 150.  Concern in waiting for surgery will be difficult to control HR which will lead to more failure.  Pt prefers surgery with this admit.   BP borderline to in mid 80s systolic. Difficult to increase meds at this point.  Have not been able to add ACEI due to hypotension and need to increase BB to control rate.  BB held last night at 2200 due to Hypotension.  IV Heparin continues.    Edema improved now 1-2+ from below the knees down.  Continue Lasix BID IV.   LOS: 9 days   Time spent with pt. : 20 minutes. [facility]  Nurse Practitioner Certified Pager [phone] 09/25/2012, 8:54 AM Agree with note written by Nada Boozer RNP   Await OR date from Dr. Tyrone Sage. Continues to diurese. BP low limiting use of rate slowing meds (BB). AF with RVR. Lungs clear but BS distant. !-2+ pitting BLE edema. Continue current. Rx.  Runell Gess 09/25/2012 2:54 PM

## 2012-09-25 NOTE — Progress Notes (Signed)
Physical Therapy Treatment Patient Details Name: [name] MRN: [id number] DOB: 1951/04/21 Today's Date: 09/25/2012 Time: 4098-1191 PT Time Calculation (min): 15 min  PT Assessment / Plan / Recommendation Comments on Treatment Session  Pt is a 62 yo male with CHF awaiting CABG. Pt with improved transfer mobility evidenced by decreased assistance but pt continues to be fatigued with ambulation. Acute PT will continue to see to work on improving activity tolerance until surgery and will then re-eval when ordered by MD.    Follow Up Recommendations  Other (comment) (to be determined following CABG)     Does the patient have the potential to tolerate intense rehabilitation     Barriers to Discharge        Equipment Recommendations  Rolling walker with 5" wheels    Recommendations for Other Services    Frequency Min 3X/week   Plan Discharge plan remains appropriate    Precautions / Restrictions Restrictions Weight Bearing Restrictions: No   Pertinent Vitals/Pain Pt denies pain    Mobility  Bed Mobility Bed Mobility: Sit to Supine Sit to Supine: 7: Independent;HOB flat Transfers Sit to Stand: 5: Supervision;From chair/3-in-1 Stand to Sit: 5: Supervision;To bed Ambulation/Gait Ambulation/Gait Assistance: 5: Supervision Ambulation Distance (Feet): 400 Feet Assistive device: Rolling walker Ambulation/Gait Assistance Details: Pt supervision due to fatigue, pt reporting fatigue at 350 ft, HR 140 bpm Gait Pattern: Step-through pattern;Decreased stride length Gait velocity: decreased per pt report    Exercises     PT Diagnosis:    PT Problem List:   PT Treatment Interventions:     PT Goals Acute Rehab PT Goals PT Goal Formulation: With patient Time For Goal Achievement: 10/07/12 Potential to Achieve Goals: Good Pt will go Supine/Side to Sit: Independently PT Goal: Supine/Side to Sit - Progress: Progressing toward goal PT Goal: Sit to Stand - Progress: Progressing  toward goal PT Goal: Stand to Sit - Progress: Progressing toward goal PT Goal: Stand - Progress: Progressing toward goal PT Goal: Ambulate - Progress: Progressing toward goal PT Goal: Up/Down Stairs - Progress: Progressing toward goal  Visit Information  Last PT Received On: 10/09/12 Assistance Needed: +1    Subjective Data  Subjective: Pt recieved sitting in chair, ready to get back to bed, agreeable to walking with PT   Cognition  Cognition Arousal/Alertness: Awake/alert Behavior During Therapy: WFL for tasks assessed/performed Overall Cognitive Status: Within Functional Limits for tasks assessed    Balance     End of Session PT - End of Session Equipment Utilized During Treatment: Gait belt;Oxygen Activity Tolerance: Patient limited by fatigue Patient left: in bed;with call bell/phone within reach;with family/visitor present (MD in room)   GP     09/25/2012, 4:10 PM Marvis Moeller, Student Physical Therapist Office #: [phone]

## 2012-09-25 NOTE — Progress Notes (Signed)
[address]       [place] [address]             [phone]                 5 Days Post-Op Procedure(s) (LRB): LEFT AND RIGHT HEART CATHETERIZATION WITH CORONARY ANGIOGRAM (N/A)  LOS: 9 days   Subjective: Up to chair, feels better. Walked in unit but desat into 80's  Objective: Vital signs in last 24 hours: Patient Vitals for the past 24 hrs:  BP Temp Temp src Pulse Resp SpO2 Weight  09/25/12 1604 96/62 mmHg 98.7 F (37.1 C) Oral - - - -  09/25/12 1406 - - - 107 17 94 % -  09/25/12 1229 95/65 mmHg 98.1 F (36.7 C) Oral - - - -  09/25/12 0736 99/62 mmHg 98.5 F (36.9 C) Oral 110 23 90 % -  09/25/12 0554 84/47 mmHg - - 109 20 93 % -  09/25/12 0500 - - - - - - 200 lb 6.4 oz (90.9 kg)  09/25/12 0343 89/72 mmHg 98.6 F (37 C) Oral 115 24 95 % -  09/25/12 0012 106/72 mmHg 97.3 F (36.3 C) Oral 104 24 94 % -  09/24/12 2151 - - - - - 95 % -  09/24/12 2027 103/49 mmHg - - 85 25 95 % -  09/24/12 1948 85/60 mmHg 98.8 F (37.1 C) Oral - 22 95 % -    Filed Weights   09/23/12 0400 09/24/12 0500 09/25/12 0500  Weight: 200 lb 6.4 oz (90.9 kg) 199 lb 11.8 oz (90.6 kg) 200 lb 6.4 oz (90.9 kg)    Hemodynamic parameters for last 24 hours:    Intake/Output from previous day: 06/03 0701 - 06/04 0700 In: 1072 [P.O.:790; I.V.:282] Out: 1525 [Urine:1525] Intake/Output this shift: Total I/O In: 588 [P.O.:480; I.V.:108] Out: 925 [Urine:925]  Scheduled Meds: . aspirin EC  81 mg Oral Daily  . atorvastatin  40 mg Oral q1800  . budesonide (PULMICORT) nebulizer solution  0.5 mg Nebulization BID  . cephALEXin  500 mg Oral Q6H  . digoxin  0.125 mg Oral Daily  . furosemide  40 mg Intravenous BID  . ipratropium  0.5 mg Nebulization Q8H  . levalbuterol  0.63 mg Nebulization Q8H  . metoprolol tartrate  25 mg Oral Q8H  . mirtazapine  15 mg Oral QHS  . potassium chloride  20 mEq Oral Daily   Continuous Infusions: . sodium chloride    . heparin 1,200 Units/hr  (09/25/12 1045)   PRN Meds:.sodium chloride, acetaminophen, levalbuterol, LORazepam, morphine injection, nitroGLYCERIN, ondansetron (ZOFRAN) IV  PE Up in chair Neuro intact afib persists, murmur of as Feet more  Swollen today , still with erythema but improving  Lab Results: CBC:  Recent Labs  09/24/12 0354 09/25/12 0340  WBC 4.6 4.2  HGB 14.3 13.7  HCT 44.8 42.6  PLT 136* 143*   BMET:   Recent Labs  09/24/12 0354 09/25/12 0340  NA 140 140  K 3.9 3.8  CL 98 99  CO2 36* 35*  GLUCOSE 104* 112*  BUN 22 19  CREATININE 1.12 1.04  CALCIUM 8.6 8.5    PT/INR: No results found for this basename: LABPROT, INR,  in the last 72 hours Lab Results  Component Value Date   ALT 18 09/16/2012   AST 30 09/16/2012   ALKPHOS 67 09/16/2012   BILITOT 0.7 09/16/2012    Radiology Dg Chest [facility] [address]  09/25/2012   *RADIOLOGY REPORT*  Clinical Data: Evaluate edema  PORTABLE CHEST - 1 VIEW  Comparison: 09/23/2012  Findings: Heart size is normal. There are small bilateral pleural effusions and mild interstitial edema.  No airspace consolidation identified. Atelectasis noted within the lung bases.  IMPRESSION:  1.  Mild to moderate CHF.   Original Report Authenticated By: Signa Kell, M.D.   Spirometry FEV1 15 % predicted   Assessment/Plan: S/P Procedure(s) (LRB): LEFT AND RIGHT HEART CATHETERIZATION WITH CORONARY ANGIOGRAM (N/A) Pt consult, ambulate  AVR, CABG, Poss MAZE when ready Will follow up with  Pulmonary Service and  pulmonary status before surgery, not sure can wait 6 weeks, perhaps 1-2 weeks   Delight Ovens MD 09/25/2012 5:11 PM

## 2012-09-25 NOTE — Consult Note (Signed)
PULMONARY  / CRITICAL CARE MEDICINE  Name: [name] MRN: [id number] DOB: Dec 26, 1950    ADMISSION DATE:  09/16/2012 CONSULTATION DATE:  09/23/12  REFERRING MD :  Dr. Tresa Endo / Dr. Lorenso Courier PRIMARY SERVICE:  Dr. Tresa Endo  CHIEF COMPLAINT:  Pre-Op AVR Pulmonary Clearance  BRIEF PATIENT DESCRIPTION:  62 y/o M, smoker, with minimal medical compliance admitted 5/26 1 month hx of worsening SOB, BLE swelling.  Found to have severe AS pending AVR.    SIGNIFICANT EVENTS / STUDIES:  5/26 - Admit with SOB, LE swelling, severe AS on ECHO  LINES / TUBES:   CULTURES:   ANTIBIOTICS: Ancef 5/28>>>5/30 Keflex  5/30>>>  SUBJECTIVE:  No worsening sob  VITAL SIGNS: Temp:  [97.3 F (36.3 C)-98.8 F (37.1 C)] 98.5 F (36.9 C) (06/04 0736) Pulse Rate:  [76-115] 110 (06/04 0736) Resp:  [19-25] 23 (06/04 0736) BP: (84-108)/(47-85) 99/62 mmHg (06/04 0736) SpO2:  [90 %-98 %] 90 % (06/04 0736) Weight:  [200 lb 6.4 oz (90.9 kg)] 200 lb 6.4 oz (90.9 kg) (06/04 0500)  PHYSICAL EXAMINATION: General:  wdwn adult male, appears older than stated age, no distress Neuro:  AAOx4, speech clear, MAE HEENT: no bruit Cardiovascular:  IRIR, 3-4/6 SEM heard best at [facility] R, more tachy today Lungs: prolonged exp phase, BS diminished bilaterally, no overt bronchospasm Abdomen:  Round/soft, tolerating PO's Ext: 1-2 + pitting edema, symmetric    Recent Labs Lab 09/23/12 0345 09/24/12 0354 09/25/12 0340  NA 140 140 140  K 3.5 3.9 3.8  CL 98 98 99  CO2 38* 36* 35*  BUN 23 22 19   CREATININE 0.93 1.12 1.04  GLUCOSE 98 104* 112*    Recent Labs Lab 09/23/12 0345 09/24/12 0354 09/25/12 0340  HGB 14.6 14.3 13.7  HCT 45.1 44.8 42.6  WBC 4.8 4.6 4.2  PLT 137* 136* 143*   No results found. Spirometry: very severe obstruction with FEV1 0.53 liters  ASSESSMENT / PLAN:  COPD  Pre-Op Pulmonary Clearance  62 y/o M, current smoker (43+ years with up to 2 1/2ppd, currently 1ppd) admitted for worsening  SOB / LE swelling.  Work up with ECHO noting severe AS.  Presumed COPD with smoking history.  PFT's currently pending but likely to show severe obstructive disease.  Optimally, would be best to allow for 6-8 weeks without smoking before surgery.  He is high risk for any surgical procedure from pulmonary standpoint.  Have discussed with family and patient that he may require prolonged ventilatory support post procedure.    Plan: -Continue scheduled BD's, levalbuterol preferentially over racemic albuterol in setting of AF and int RVR -scheduled budesonide, maintain -smoking cessation, we revisited this topic -will need ambulatory desaturation test prior to discharge (91-92% at rest prior to oxygen, suspect element of hypoxemia at baseline) -still remains on O2 needs , desaturated to 84%, likely need further neg  Balance to obtain this -will repeat pcxr to re eval neg balance needs as he stays O2 dep here, which is new for him  Aortic Stenosis HTN HLD PVD Plan: -Will follow up with CVTS / Cardiology regarding timing of surgery.   -continue neg balance goals -his pre procedure functional status is excellent at home, regardless of his awful PFt  Cellulitis  Plan: -keflex  [name], MD, FACP Pgr: [phone] [facility]\

## 2012-09-25 NOTE — Progress Notes (Signed)
CARDIAC REHAB PHASE I   PRE:  Rate/Rhythm: 118 afib    BP: sitting 114/76    SaO2: 95 2L  MODE:  Ambulation: 270 ft   POST:  Rate/Rhythm: 160 afib    BP: sitting 122/74     SaO2: 96 3L  Pt motivated to walk. HR 110s in bed but up immediately with standing. 120-130s with standing and increased to 150s-160 walking (by end of walk). Pt c/o being more tired and SOB today compared to walk yesterday. Used RW, 3L O2, assist x1. To recliner. HR down to 110s with rest. BP stable. SaO2 better today.  [id number]   Harriet Masson CES, ACSM 09/25/2012 9:23 AM

## 2012-09-26 DIAGNOSIS — L0390 Cellulitis, unspecified: Secondary | ICD-10-CM

## 2012-09-26 DIAGNOSIS — L0291 Cutaneous abscess, unspecified: Secondary | ICD-10-CM

## 2012-09-26 LAB — CBC
Platelets: 143 10*3/uL — ABNORMAL LOW (ref 150–400)
RBC: 4.57 MIL/uL (ref 4.22–5.81)
WBC: 5 10*3/uL (ref 4.0–10.5)

## 2012-09-26 LAB — BASIC METABOLIC PANEL
CO2: 34 mEq/L — ABNORMAL HIGH (ref 19–32)
GFR calc non Af Amer: 85 mL/min — ABNORMAL LOW (ref >90)
Glucose, Bld: 92 mg/dL (ref 70–99)
Potassium: 4.3 mEq/L (ref 3.5–5.1)
Sodium: 136 mEq/L (ref 135–145)

## 2012-09-26 LAB — HEPARIN LEVEL (UNFRACTIONATED): Heparin Unfractionated: 0.35 IU/mL (ref 0.30–0.70)

## 2012-09-26 MED ORDER — DIGOXIN 250 MCG PO TABS
0.2500 mg | ORAL_TABLET | Freq: Every day | ORAL | Status: DC
  Administered 2012-09-27 – 2012-10-07 (×11): 0.25 mg via ORAL
  Filled 2012-09-26 (×12): qty 1

## 2012-09-26 MED ORDER — DIGOXIN 250 MCG PO TABS
0.2500 mg | ORAL_TABLET | Freq: Once | ORAL | Status: AC
  Administered 2012-09-26: 0.25 mg via ORAL
  Filled 2012-09-26: qty 1

## 2012-09-26 NOTE — Progress Notes (Addendum)
PULMONARY  / CRITICAL CARE MEDICINE  Name: [name] MRN: [id number] DOB: 09-Jun-1950    ADMISSION DATE:  09/16/2012 CONSULTATION DATE:  09/23/12  REFERRING MD :  Dr. Tresa Endo / Dr. Lorenso Courier PRIMARY SERVICE:  Dr. Tresa Endo  CHIEF COMPLAINT:  Pre-Op AVR Pulmonary Clearance  BRIEF PATIENT DESCRIPTION:  62 y/o M, smoker, with minimal medical compliance admitted 5/26 1 month hx of worsening SOB, BLE swelling.  Found to have severe AS pending AVR.    SIGNIFICANT EVENTS / STUDIES:  5/26 - Admit with SOB, LE swelling, severe AS on ECHO  LINES / TUBES:   CULTURES:   ANTIBIOTICS: Ancef 5/28>>>5/30 Keflex  5/30>>>  SUBJECTIVE:  No worsening sob  VITAL SIGNS: Temp:  [98.1 F (36.7 C)-99.3 F (37.4 C)] 98.7 F (37.1 C) (06/05 1201) Pulse Rate:  [98-131] 102 (06/05 1205) Resp:  [16-18] 18 (06/05 1205) BP: (92-127)/(40-90) 106/90 mmHg (06/05 1205) SpO2:  [92 %-97 %] 96 % (06/05 1205) Weight:  [89.3 kg (196 lb 13.9 oz)] 89.3 kg (196 lb 13.9 oz) (06/05 0500)  Intake/Output Summary (Last 24 hours) at 09/26/12 1311 Last data filed at 09/26/12 1300  Gross per 24 hour  Intake    726 ml  Output   2025 ml  Net  -1299 ml  3 liters  PHYSICAL EXAMINATION: General:  wdwn adult male, appears older than stated age, no distress Neuro:  AAOx4, speech clear, MAE HEENT: no bruit Cardiovascular:  IRIR, 3-4/6 SEM [facility], more tachy today Lungs: prolonged exp phase, BS diminished bilaterally, no overt bronchospasm Abdomen:  Round/soft, tolerating PO's Ext: 1-2 + pitting edema, symmetric    Recent Labs Lab 09/24/12 0354 09/25/12 0340 09/26/12 0445  NA 140 140 136  K 3.9 3.8 4.3  CL 98 99 97  CO2 36* 35* 34*  BUN 22 19 19   CREATININE 1.12 1.04 0.99  GLUCOSE 104* 112* 92    Recent Labs Lab 09/24/12 0354 09/25/12 0340 09/26/12 0445  HGB 14.3 13.7 13.5  HCT 44.8 42.6 42.4  WBC 4.6 4.2 5.0  PLT 136* 143* 143*   Dg Chest Port 1 View  09/25/2012   *RADIOLOGY REPORT*   Clinical Data: Evaluate edema  PORTABLE CHEST - 1 VIEW  Comparison: 09/23/2012  Findings: Heart size is normal. There are small bilateral pleural effusions and mild interstitial edema.  No airspace consolidation identified. Atelectasis noted within the lung bases.  IMPRESSION:  1.  Mild to moderate CHF.   Original Report Authenticated By: Signa Kell, M.D.   Spirometry:  FEV1/FVC: 62% FEV1: 15% predicted w/ no post-bronch change  ASSESSMENT / PLAN: Acute on Chronic resp failure in setting of:   Severe AS, HTN, HLD and PVD w/ Pulmonary edema w/ basilar atelectasis and c/b COPD GOLD class D  w/ FEV1 of 0.53 liters which is only 15% predicted, and SEVERE symptom burden w/ CAT (COPD assessment test) score >10.  discussion Optimally, would be best to allow for 6-8 weeks without smoking before surgery.  He is high risk for any surgical procedure from pulmonary standpoint.  Have discussed with family and patient that he may require prolonged ventilatory support post procedure. Understand that from a cardiac stand-point we may not have this luxury.   rec: -Continue scheduled BD's, Triple combo w/ xopenex, budesonide and atrovent  -levalbuterol preferentially over racemic albuterol in setting of AF and int RVR (would send him home on this regimen as he will not be able to obtain MDIs due to cost) -  smoking cessation-->ideally 6-8 weeks,  may not have this luxury, clearly going to the OR sooner increases post-op risk, but not a absolute contraindication.  -cont diuresis, hope that this will decrease symptom burden.  -walking oximetry prior to d/c-->will prob need home O2 -Dr Shelle Iron will see him on June 26 at 1045 am, we can further assess his readiness and risk for surgery at this point.   Cellulitis Plan: -keflex -slow clinical progress, may need longer duration  [name], MD, FACP Pgr: [phone] [facility]    Will sign off Call if needed

## 2012-09-26 NOTE — Progress Notes (Signed)
CARDIAC REHAB PHASE I   PRE:  Rate/Rhythm: 112 Afib  BP:  Supine:   Sitting: 86/43  Standing:    SaO2: 93 2L  MODE:  Ambulation: 270 ft   POST:  Rate/Rhythm: 147 Afib  BP:  Supine:   Sitting: 103/85  Standing:    SaO2: 94 3L 1043-1110 Assisted X 1 used walker and O2 3L to ambulate. Pt stood HR increased to 125. He was able to walk 270 feet without c/o. Pt states that it felt easier today. HR on return from walk 147. Pt back to recliner after walk with call light in reach.Pt states that walk felt good today.  Melina Copa RN 09/26/2012 11:10 AM

## 2012-09-26 NOTE — Progress Notes (Signed)
ANTICOAGULATION CONSULT NOTE - Follow Up Consult  Pharmacy Consult for Heparin Indication: atrial fibrillation  No Known Allergies  Patient Measurements: Height: 5\' 10"  (177.8 cm) Weight: 196 lb 13.9 oz (89.3 kg) IBW/kg (Calculated) : 73 Heparin Dosing Weight: 85 kg  Vital Signs: Temp: 98.1 F (36.7 C) (06/05 0737) Temp src: Oral (06/05 0737) BP: 97/40 mmHg (06/05 0735) Pulse Rate: 98 (06/05 0735)  Labs:  Recent Labs  09/24/12 0354 09/24/12 1403 09/25/12 0340 09/26/12 0445  HGB 14.3  --  13.7 13.5  HCT 44.8  --  42.6 42.4  PLT 136*  --  143* 143*  HEPARINUNFRC 0.95* 0.72* 0.43 0.35  CREATININE 1.12  --  1.04 0.99    Estimated Creatinine Clearance: 85.9 ml/min (by C-G formula based on Cr of 0.99).   Assessment: Progressively worsening SOB, bilateral LE edema, palpitations. In Afib/RVR, out of medications for > 1 month. Trop 0.09, BNP 32487  Anticoagulation - Heparin per Rx for afib. Begun 5/26. Dopplers neg for DVT. S/p cath 5/30.Will need 1V CABG/AVR +/- surgical MAZE when pulmonary status improves (maybe 1-2 weeks). Heparin level remains at goal 0.35. CBC stable. No bleeding noted.  Infectious Disease -Afebrile, WBC 5. Ancef started 5/28 then Keflex, plan 7 days for LE cellulitis. Today is day #9 Ancef 5/28>>5/29 Cephalexin 5/30>> 5/27 - MRSA screen neg  Cardiovascular- HTN, chronic afib, Hx L iliac stent, Aofem BPG. EF 30-35%. Meds: ASA 81mg , Lipitor, digoxin, IV Lasix, metoprolol, K bp still low 90/40s-no acei yet, but HR 107-131, increase digoxin  Endocrinology -A1C 5.4, glucose 112, TSH ok  Gastrointestinal / Nutrition - LFTs ok, PO PPI  Neurology - Ativan prn, Remeron qhs  Nephrology -Scr trending down to 0.99. Lytes ok. I/O -1194.  Pulmonary - smoker. SOB on admit, on 2L [place] 96%. Desats with walking.  Hematology / Oncology - Hgb 13.5, Plts 143  PTA Medication Issues - hx non-compliance, out of meds for 1-2 months; Lotensin-HCT (20-12.5) + plain  Lotensin (20),  Best Practices: IV heparin, SCDs, PO PPI   Goal of Therapy:  Heparin level 0.3-0.7 units/ml Monitor platelets by anticoagulation protocol: Yes   Plan:  - Continue heparin at 1200 units/hr - daily HL and CBC  Merilynn Finland, [employer] 09/26/2012,9:54 AM

## 2012-09-26 NOTE — Progress Notes (Signed)
Subjective:  Up in bed, no SOB at rest. HR still fast at rest.  Objective:  Vital Signs in the last 24 hours: Temp:  [98.1 F (36.7 C)-99.3 F (37.4 C)] 98.1 F (36.7 C) (06/05 0737) Pulse Rate:  [98-131] 98 (06/05 0735) Resp:  [16-17] 16 (06/05 0737) BP: (92-127)/(40-70) 97/40 mmHg (06/05 0735) SpO2:  [92 %-97 %] 96 % (06/05 0735) Weight:  [89.3 kg (196 lb 13.9 oz)] 89.3 kg (196 lb 13.9 oz) (06/05 0500)  Intake/Output from previous day:  Intake/Output Summary (Last 24 hours) at 09/26/12 1610 Last data filed at 09/26/12 0800  Gross per 24 hour  Intake   1056 ml  Output   2250 ml  Net  -1194 ml    Physical Exam: General appearance: alert, cooperative and no distress No JVD Lungs: exp wheezing bilat Heart: irregularly irregular rhythm and 2/6 systolic murmur c/w AS Abd soft with + bs Extrem: 1+ edema, erythema   Rate: 125  Rhythm: atrial fibrillation  Lab Results:  Recent Labs  09/25/12 0340 09/26/12 0445  WBC 4.2 5.0  HGB 13.7 13.5  PLT 143* 143*    Recent Labs  09/25/12 0340 09/26/12 0445  NA 140 136  K 3.8 4.3  CL 99 97  CO2 35* 34*  GLUCOSE 112* 92  BUN 19 19  CREATININE 1.04 0.99    Imaging: Imaging results have been reviewed  Cardiac Studies:  Assessment/Plan:   Principal Problem:   Acute respiratory failure on admission 09/16/12- now resolved  Active Problems:   Atrial fibrillation with RVR, was in atrial fib in 04/2011   Acute combined systolic and diastolic CHF, NYHA class 3 -- previous normal LV function and moderate AS- re-evaluate; EF now 30-35% with regional WMA   Aortic stenosis, severe   CAD (coronary artery disease), single vessel disease of RCA/PDA, mild disease of LM- cath 09/20/12   Bilateral lower extremity edema, secondary to acute CHF   Cellulitis, lower extremity- treated   PAD (peripheral artery disease), decreased bil. ABIs   Chronic obstructive pulmonary disease   Cardiomyopathy, ischemic- 30-35% 2D    HTN  (hypertension)   Hyperlipidemia   Noncompliance, no meds for two months prior to admission   Tobacco use    PLAN: He continues to diurese. His HR is not controlled. B/P is soft. Will increase Lanoxin for rate control. Not yet able to transfer to telemetry.  Corine Shelter PA-C Beeper [phone] 09/26/2012, 8:33 AM    Patient seen and examined. Agree with assessment and plan. Continues to feel better. AF rate 100 - 110. Will increase Lanoxin for improved rate control. Decreased breath sounds. For  AVR, CABG and maze ? Next week rather than 6 weeks. Last cigarette was on 09/16/12.   Lennette Bihari, MD, [facility] 09/26/2012 9:16 AM

## 2012-09-27 ENCOUNTER — Other Ambulatory Visit: Payer: Self-pay | Admitting: *Deleted

## 2012-09-27 DIAGNOSIS — I2589 Other forms of chronic ischemic heart disease: Secondary | ICD-10-CM

## 2012-09-27 DIAGNOSIS — I2510 Atherosclerotic heart disease of native coronary artery without angina pectoris: Secondary | ICD-10-CM

## 2012-09-27 DIAGNOSIS — I359 Nonrheumatic aortic valve disorder, unspecified: Secondary | ICD-10-CM

## 2012-09-27 LAB — CBC
MCV: 92.3 fL (ref 78.0–100.0)
Platelets: 155 10*3/uL (ref 150–400)
RBC: 4.42 MIL/uL (ref 4.22–5.81)
WBC: 4.9 10*3/uL (ref 4.0–10.5)

## 2012-09-27 LAB — BASIC METABOLIC PANEL
CO2: 33 mEq/L — ABNORMAL HIGH (ref 19–32)
Chloride: 99 mEq/L (ref 96–112)
Creatinine, Ser: 0.96 mg/dL (ref 0.50–1.35)
Potassium: 4.2 mEq/L (ref 3.5–5.1)

## 2012-09-27 LAB — HEPARIN LEVEL (UNFRACTIONATED): Heparin Unfractionated: 0.29 IU/mL — ABNORMAL LOW (ref 0.30–0.70)

## 2012-09-27 MED ORDER — IRBESARTAN 75 MG PO TABS
75.0000 mg | ORAL_TABLET | Freq: Every day | ORAL | Status: DC
  Administered 2012-09-27 – 2012-10-06 (×10): 75 mg via ORAL
  Filled 2012-09-27 (×12): qty 1

## 2012-09-27 MED ORDER — LEVALBUTEROL HCL 0.63 MG/3ML IN NEBU
0.6300 mg | INHALATION_SOLUTION | Freq: Two times a day (BID) | RESPIRATORY_TRACT | Status: DC
  Administered 2012-09-27 – 2012-10-04 (×14): 0.63 mg via RESPIRATORY_TRACT
  Filled 2012-09-27 (×21): qty 3

## 2012-09-27 MED ORDER — IPRATROPIUM BROMIDE 0.02 % IN SOLN
0.5000 mg | Freq: Two times a day (BID) | RESPIRATORY_TRACT | Status: DC
  Administered 2012-09-27 – 2012-10-07 (×21): 0.5 mg via RESPIRATORY_TRACT
  Filled 2012-09-27 (×24): qty 2.5

## 2012-09-27 NOTE — Progress Notes (Signed)
Patient ID: [name], male   DOB: [birth year], 62 y.o.   MRN: [id number]      [address]       [place] [address]             [phone]                 7 Days Post-Op Procedure(s) (LRB): LEFT AND RIGHT HEART CATHETERIZATION WITH CORONARY ANGIOGRAM (N/A)  LOS: 11 days   Subjective: Has made good progress this week, still with edema but now up in chair and walking some  Objective: Vital signs in last 24 hours: Patient Vitals for the past 24 hrs:  BP Temp Temp src Pulse Resp SpO2 Weight  09/27/12 1524 84/61 mmHg - - - - 91 % -  09/27/12 1521 - 98.1 F (36.7 C) Oral - 20 - -  09/27/12 1300 102/70 mmHg - - - - - -  09/27/12 1258 102/70 mmHg - - 100 - - -  09/27/12 1141 100/60 mmHg 98.8 F (37.1 C) Oral - 18 96 % -  09/27/12 0946 - - - 100 - - -  09/27/12 0815 - - - - - 96 % -  09/27/12 0800 100/59 mmHg - - - - 96 % -  09/27/12 0733 - 98 F (36.7 C) Oral - 18 98 % -  09/27/12 0545 - - - - - 95 % -  09/27/12 0500 - - - - - - 190 lb 0.6 oz (86.2 kg)  09/27/12 0326 101/78 mmHg 98.1 F (36.7 C) Oral - 20 93 % -  09/26/12 2333 92/45 mmHg 98.8 F (37.1 C) Oral - 16 93 % -  09/26/12 2116 - - - - - 96 % -  09/26/12 1921 119/64 mmHg 98.4 F (36.9 C) Oral 100 16 96 % -    Filed Weights   09/25/12 0500 09/26/12 0500 09/27/12 0500  Weight: 200 lb 6.4 oz (90.9 kg) 196 lb 13.9 oz (89.3 kg) 190 lb 0.6 oz (86.2 kg)    Hemodynamic parameters for last 24 hours:    Intake/Output from previous day: 06/05 0701 - 06/06 0700 In: 626 [P.O.:350; I.V.:276] Out: 2850 [Urine:2850] Intake/Output this shift: Total I/O In: 715.4 [P.O.:600; I.V.:115.4] Out: 450 [Urine:450]  Scheduled Meds: . aspirin EC  81 mg Oral Daily  . atorvastatin  40 mg Oral q1800  . budesonide (PULMICORT) nebulizer solution  0.5 mg Nebulization BID  . cephALEXin  500 mg Oral Q6H  . digoxin  0.25 mg Oral Daily  . furosemide  40 mg Intravenous BID  . ipratropium  0.5 mg Nebulization BID  .  irbesartan  75 mg Oral Daily  . levalbuterol  0.63 mg Nebulization BID  . metoprolol tartrate  25 mg Oral Q8H  . mirtazapine  15 mg Oral QHS  . potassium chloride  20 mEq Oral Daily   Continuous Infusions: . sodium chloride    . heparin 1,300 Units/hr (09/27/12 0835)   PRN Meds:.sodium chloride, acetaminophen, levalbuterol, LORazepam, morphine injection, nitroGLYCERIN, ondansetron (ZOFRAN) IV  General appearance: alert, cooperative, appears older than stated age and no distress Neurologic: intact Heart: irregularly irregular rhythm and systolic murmur: holosystolic 3/6, crescendo throughout the precordium Lungs: diminished breath sounds bibasilar Abdomen: soft, non-tender; bowel sounds normal; no masses,  no organomegaly Extremities: edema 2-3 +, decreased erythemia   Lab Results: CBC: Recent Labs  09/26/12 0445 09/27/12 0430  WBC 5.0 4.9  HGB 13.5 13.1  HCT 42.4 40.8  PLT  143* 155   BMET:  Recent Labs  09/26/12 0445 09/27/12 0430  NA 136 138  K 4.3 4.2  CL 97 99  CO2 34* 33*  GLUCOSE 92 91  BUN 19 17  CREATININE 0.99 0.96  CALCIUM 8.8 8.7    PT/INR: No results found for this basename: LABPROT, INR,  in the last 72 hours   Radiology Dg Chest [facility] 1 View  09/25/2012   *RADIOLOGY REPORT*  Clinical Data: Evaluate edema  PORTABLE CHEST - 1 VIEW  Comparison: 09/23/2012  Findings: Heart size is normal. There are small bilateral pleural effusions and mild interstitial edema.  No airspace consolidation identified. Atelectasis noted within the lung bases.  IMPRESSION:  1.  Mild to moderate CHF.   Original Report Authenticated By: Signa Kell, M.D.   Assessment/Plan: S/P Procedure(s) (LRB): LEFT AND RIGHT HEART CATHETERIZATION WITH CORONARY ANGIOGRAM (N/A) Plan CABG and AVR with tissue value and poss MAZE and atrial clip on June 17 Tuesday, Hope by then he will have reached maxi um benefit of PT/treatment of heart failure and cellulitis and not smoking then can be  reached without aortic valve replacement. Pulmonary suggested waiting 6 weeks,  but I think that long will not benefit much more and runs increased risk of waiting too long. I will be out of town next week. Dr Altamese Dilling is here this weekend if needed.  Delight Ovens MD 09/27/2012 5:27 PM

## 2012-09-27 NOTE — Progress Notes (Signed)
[facility]  Subjective: No complaints. Denies chest pain or SOB. He is unaware of his a-fib.   Objective: Vital signs in last 24 hours: Temp:  [98 F (36.7 C)-98.8 F (37.1 C)] 98 F (36.7 C) (06/06 0733) Pulse Rate:  [100-102] 100 (06/06 0946) Resp:  [16-20] 18 (06/06 0733) BP: (92-119)/(30-90) 101/78 mmHg (06/06 0326) SpO2:  [93 %-98 %] 96 % (06/06 0815) Weight:  [190 lb 0.6 oz (86.2 kg)] 190 lb 0.6 oz (86.2 kg) (06/06 0500) Last BM Date: 09/24/12  Intake/Output from previous day: 06/05 0701 - 06/06 0700 In: 614 [P.O.:350; I.V.:264] Out: 2850 [Urine:2850] Intake/Output this shift: Total I/O In: 360 [P.O.:360] Out: 250 [Urine:250]  Medications Current Facility-Administered Medications  Medication Dose Route Frequency Provider Last Rate Last Dose  . 0.9 %  sodium chloride infusion   Intravenous Continuous PRN Lennette Bihari, MD      . acetaminophen (TYLENOL) tablet 650 mg  650 mg Oral Q4H PRN Runell Gess, MD   650 mg at 09/23/12 2107  . aspirin EC tablet 81 mg  81 mg Oral Daily Nada Boozer, NP   81 mg at 09/27/12 0947  . atorvastatin (LIPITOR) tablet 40 mg  40 mg Oral q1800 Nada Boozer, NP   40 mg at 09/26/12 1722  . budesonide (PULMICORT) nebulizer solution 0.5 mg  0.5 mg Nebulization BID Jeanella Craze, NP   0.5 mg at 09/27/12 0815  . cephALEXin (KEFLEX) capsule 500 mg  500 mg Oral Q6H Judyann Munson, MD   500 mg at 09/27/12 4098  . digoxin (LANOXIN) tablet 0.25 mg  0.25 mg Oral Daily Eda Paschal Kilroy, PA-C   0.25 mg at 09/27/12 0946  . furosemide (LASIX) injection 40 mg  40 mg Intravenous BID Wilburt Finlay, PA-C   40 mg at 09/27/12 1191  . heparin ADULT infusion 100 units/mL (25000 units/250 mL)  1,300 Units/hr Intravenous Continuous Hilario Quarry Amend, [facility] 13 mL/hr at 09/27/12 0835 1,300 Units/hr at 09/27/12 0835  . ipratropium (ATROVENT) nebulizer solution 0.5 mg  0.5 mg Nebulization Q8H Merwyn Katos, MD   0.5 mg at 09/27/12 0546  .  levalbuterol (XOPENEX) nebulizer solution 0.63 mg  0.63 mg Nebulization Q8H Merwyn Katos, MD   0.63 mg at 09/27/12 0546  . levalbuterol (XOPENEX) nebulizer solution 0.63 mg  0.63 mg Nebulization Q4H PRN Merwyn Katos, MD      . LORazepam (ATIVAN) tablet 0.5 mg  0.5 mg Oral Q4H PRN Nada Boozer, NP   0.5 mg at 09/26/12 2200  . metoprolol tartrate (LOPRESSOR) tablet 25 mg  25 mg Oral Q8H Nada Boozer, NP   25 mg at 09/27/12 4782  . mirtazapine (REMERON) tablet 15 mg  15 mg Oral QHS Lennette Bihari, MD   15 mg at 09/26/12 2200  . morphine 2 MG/ML injection 2 mg  2 mg Intravenous Q1H PRN Runell Gess, MD      . nitroGLYCERIN (NITROSTAT) SL tablet 0.4 mg  0.4 mg Sublingual Q5 Min x 3 PRN Nada Boozer, NP      . ondansetron [facility]) injection 4 mg  4 mg Intravenous Q6H PRN Runell Gess, MD      . potassium chloride SA (K-DUR,KLOR-CON) CR tablet 20 mEq  20 mEq Oral Daily Runell Gess, MD   20 mEq at 09/27/12 0946    PE: General appearance: alert, cooperative, no distress and out of bed and in a chair Lungs: decreased breath sounds  bilateral, but otherwise clear Heart: irregularly irregular rhythm and 1/6 blowing murmur best heard at LSB Extremities: 1+ bilateral LEE Pulses: 2+ and symmetric Skin: warm and dry Neurologic: Grossly normal  Lab Results:   Recent Labs  09/25/12 0340 09/26/12 0445 09/27/12 0430  WBC 4.2 5.0 4.9  HGB 13.7 13.5 13.1  HCT 42.6 42.4 40.8  PLT 143* 143* 155   BMET  Recent Labs  09/25/12 0340 09/26/12 0445 09/27/12 0430  NA 140 136 138  K 3.8 4.3 4.2  CL 99 97 99  CO2 35* 34* 33*  GLUCOSE 112* 92 91  BUN 19 19 17   CREATININE 1.04 0.99 0.96  CALCIUM 8.5 8.8 8.7   BNP (last 3 results)  Recent Labs  09/16/12 1712 09/17/12 0355 09/24/12 0354  PROBNP 32487.0* 26816.0* 15462.0*    Assessment/Plan  Principal Problem:   Acute respiratory failure Active Problems:   Atrial fibrillation with RVR, was in atrial fib in 04/2011   HTN  (hypertension)   Hyperlipidemia   Noncompliance, no meds for two weeks prior to admission   Tobacco use   Acute combined systolic and diastolic CHF, NYHA class 3 -- previous normal LV function and moderate AS- re-evaluate; EF now 30-35% with regional WMA   Bilateral lower extremity edema, secondary to acute CHF   Aortic stenosis, severe   CAD (coronary artery disease), single vessel disease of RCA/PDA, mild disease of LM   Cellulitis, lower extremity- treated   PAD (peripheral artery disease), decreased bil. ABIs   Chronic obstructive pulmonary disease   Cardiomyopathy, ischemic- 30-35% 2D   Plan: HR is better controlled with increase in Lanoxin. His current HR is in the low 100s. Pt continues to have improvement in CHF. Breathing has improved significantly and lungs are fairly clear on exam. Peripheral edema is greatly improved. Renal function is stable. K+ is WNL. BP is stable. Continue on BID dosing of IV Lasix. Will recheck BNP in the AM. ? Possible CABG, AVR and Maze procedure next week.     LOS: 11 days    Brittainy M. Delmer Islam 09/27/2012 10:48 AM    Patient seen and examined. Agree with assessment and plan. Good diuresis yesterday - 2224; -18040 since admission. Continue IV lasix. Still with lower extremity edema. Will re check BNP, renal fxn in am. AF rate better controlled on increased lanoxin. Will add low dose ARB rather than ACE-I with pulmonary status in attempt to further improve CHF.  Lennette Bihari, MD, [facility] 09/27/2012 1:11 PM

## 2012-09-27 NOTE — Progress Notes (Addendum)
ANTICOAGULATION CONSULT NOTE - Follow Up Consult  Pharmacy Consult for Heparin Indication: atrial fibrillation  No Known Allergies  Patient Measurements: Height: 5\' 10"  (177.8 cm) Weight: 190 lb 0.6 oz (86.2 kg) IBW/kg (Calculated) : 73  Vital Signs: Temp: 98 F (36.7 C) (06/06 0733) Temp src: Oral (06/06 0733) BP: 101/78 mmHg (06/06 0326)  Labs:  Recent Labs  09/25/12 0340 09/26/12 0445 09/27/12 0430  HGB 13.7 13.5 13.1  HCT 42.6 42.4 40.8  PLT 143* 143* 155  HEPARINUNFRC 0.43 0.35 0.29*  CREATININE 1.04 0.99 0.96    Estimated Creatinine Clearance: 81.3 ml/min (by C-G formula based on Cr of 0.96).   Assessment: Pt on heparin per Rx for afib. Begun 5/26. Dopplers neg for DVT. S/p cath 5/30. Heparin level slightly below goal 0.29. CBC stable. No bleeding noted. Will need 1V CABG/AVR +/- surgical MAZE when pulmonary status improves (maybe 4-6 weeks per pulmo note).   Goal of Therapy:  Heparin level 0.3-0.7 units/ml Monitor platelets by anticoagulation protocol: Yes   Plan:  - Increase heparin slightly to 1300 units/hr - daily HL and CBC - ?longterm anticoagulation plans if pt surgery prolonged to end of month?  Christoper Fabian, PharmD, BCPS Clinical pharmacist, pager [phone] 09/27/2012,8:22 AM  Pharmacist Heart Failure Core Measure Documentation  Assessment: AUTUMN GUNN has an EF documented as 30-35% on 09/17/12 by ECHO.  Rationale: Heart failure patients with left ventricular systolic dysfunction (LVSD) and an EF < 40% should be prescribed an angiotensin converting enzyme inhibitor (ACEI) or angiotensin receptor blocker (ARB) at discharge unless a contraindication is documented in the medical record.  This patient is not currently on an ACEI or ARB for HF.  This note is being placed in the record in order to provide documentation that a contraindication to the use of these agents is present for this encounter.  ACE Inhibitor or Angiotensin Receptor Blocker  is contraindicated (specify all that apply)  []   ACEI allergy AND ARB allergy []   Angioedema [x]   Moderate or severe aortic stenosis []   Hyperkalemia []   Hypotension []   Renal artery stenosis []   Worsening renal function, preexisting renal disease or dysfunction   Lavonia Dana 09/27/2012 9:07 AM

## 2012-09-27 NOTE — Progress Notes (Signed)
CARDIAC REHAB PHASE I   PRE:  Rate/Rhythm: 102 afib    BP: sitting 100/62    SaO2: 92 RA  MODE:  Ambulation: 350 ft   POST:  Rate/Rhythm: 130 afib    BP: sitting 108/55     SaO2: 97 3L  Tolerated well, HR more controlled therefore less fatigue today. Increased distance. Feels fairly well. Will continue to follow. [id number]   Elissa Lovett [place] CES, ACSM 09/27/2012 3:01 PM

## 2012-09-28 LAB — BASIC METABOLIC PANEL
BUN: 19 mg/dL (ref 6–23)
CO2: 31 mEq/L (ref 19–32)
Chloride: 97 mEq/L (ref 96–112)
Glucose, Bld: 99 mg/dL (ref 70–99)
Potassium: 4 mEq/L (ref 3.5–5.1)
Sodium: 136 mEq/L (ref 135–145)

## 2012-09-28 LAB — CBC
HCT: 41 % (ref 39.0–52.0)
Hemoglobin: 13.3 g/dL (ref 13.0–17.0)
MCHC: 32.4 g/dL (ref 30.0–36.0)
RBC: 4.47 MIL/uL (ref 4.22–5.81)
WBC: 5.3 10*3/uL (ref 4.0–10.5)

## 2012-09-28 LAB — HEPARIN LEVEL (UNFRACTIONATED)
Heparin Unfractionated: 0.28 IU/mL — ABNORMAL LOW (ref 0.30–0.70)
Heparin Unfractionated: 0.46 IU/mL (ref 0.30–0.70)

## 2012-09-28 MED ORDER — BIOTENE DRY MOUTH MT LIQD
15.0000 mL | Freq: Two times a day (BID) | OROMUCOSAL | Status: DC
  Administered 2012-09-29 – 2012-10-07 (×15): 15 mL via OROMUCOSAL

## 2012-09-28 MED ORDER — SODIUM CHLORIDE 0.9 % IV SOLN
INTRAVENOUS | Status: DC
  Administered 2012-10-03: 10 mL/h via INTRAVENOUS

## 2012-09-28 NOTE — Progress Notes (Signed)
[facility] Progress Note  Subjective:  Breathing better  Objective:   Vital Signs in the last 24 hours: Temp:  [98 F (36.7 C)-98.8 F (37.1 C)] 98 F (36.7 C) (06/07 0800) Pulse Rate:  [100] 100 (06/06 1258) Resp:  [18-20] 20 (06/07 0411) BP: (84-112)/(50-71) 96/50 mmHg (06/07 0731) SpO2:  [91 %-97 %] 94 % (06/07 0832) Weight:  [195 lb 12.3 oz (88.8 kg)] 195 lb 12.3 oz (88.8 kg) (06/07 0411)  Intake/Output from previous day: 06/06 0701 - 06/07 0700 In: 1342.1 [P.O.:1020; I.V.:322.1] Out: 1880 [Urine:1880]  Scheduled: . antiseptic oral rinse  15 mL Mouth Rinse BID  . aspirin EC  81 mg Oral Daily  . atorvastatin  40 mg Oral q1800  . budesonide (PULMICORT) nebulizer solution  0.5 mg Nebulization BID  . cephALEXin  500 mg Oral Q6H  . digoxin  0.25 mg Oral Daily  . furosemide  40 mg Intravenous BID  . ipratropium  0.5 mg Nebulization BID  . irbesartan  75 mg Oral Daily  . levalbuterol  0.63 mg Nebulization BID  . metoprolol tartrate  25 mg Oral Q8H  . mirtazapine  15 mg Oral QHS  . potassium chloride  20 mEq Oral Daily   . sodium chloride    . heparin 1,450 Units/hr (09/28/12 0700)   Physical Exam:   General appearance: alert, cooperative and no distress Neck: no carotid bruit, no JVD and supple, symmetrical, trachea midline Lungs: diminished breath sounds diffusely Heart: irregularly irregular rhythm 2/6 SEM aortic area Abdomen: soft, non-tender; bowel sounds normal; no masses,  no organomegaly Extremities: mild residual edema, erythema improved Neurologic: nonfocal   Rate: 95  Rhythm: atrial fibrillation  Lab Results:    Recent Labs  09/27/12 0430 09/28/12 0405  NA 138 136  K 4.2 4.0  CL 99 97  CO2 33* 31  GLUCOSE 91 99  BUN 17 19  CREATININE 0.96 1.09   No results found for this basename: TROPONINI, CK, MB,  in the last 72 hours Hepatic Function Panel No results found for this basename: PROT, ALBUMIN, AST, ALT,  ALKPHOS, BILITOT, BILIDIR, IBILI,  in the last 72 hours No results found for this basename: INR,  in the last 72 hours BNP (last 3 results)  Recent Labs  09/17/12 0355 09/24/12 0354 09/28/12 0405  PROBNP 26816.0* 15462.0* 12365.0*    Lipid Panel     Component Value Date/Time   CHOL 145 09/17/2012 0355   TRIG 77 09/17/2012 0355   HDL 47 09/17/2012 0355   CHOLHDL 3.1 09/17/2012 0355   VLDL 15 09/17/2012 0355   LDLCALC 83 09/17/2012 0355      Imaging:  No results found.    Assessment/Plan:   Principal Problem:   Acute respiratory failure Active Problems:   Atrial fibrillation with RVR, was in atrial fib in 04/2011   HTN (hypertension)   Hyperlipidemia   Noncompliance, no meds for two weeks prior to admission   Tobacco use   Acute combined systolic and diastolic CHF, NYHA class 3 -- previous normal LV function and moderate AS- re-evaluate; EF now 30-35% with regional WMA   Bilateral lower extremity edema, secondary to acute CHF   Aortic stenosis, severe   CAD (coronary artery disease), single vessel disease of RCA/PDA, mild disease of LM   Cellulitis, lower extremity- treated   PAD (peripheral artery disease), decreased bil. ABIs   Chronic obstructive pulmonary disease   Cardiomyopathy, ischemic- 30-35% 2D    Continues  to improve. -537 yesterday; total since admission -[id number]. Continue diuresis, and pulmonary optimization prior to surgery. BNP still elevated at 12365, but significantly reduced. AF rate better controlled. Cellulitis improving on keflex.  Now scheduled for 10/08/12. Will transfer to 2000 today.   Lennette Bihari, MD, [facility] 09/28/2012, 9:05 AM

## 2012-09-28 NOTE — Progress Notes (Signed)
ANTICOAGULATION CONSULT NOTE - Follow Up Consult  Pharmacy Consult for Heparin Indication: atrial fibrillation  No Known Allergies  Patient Measurements: Height: 5\' 10"  (177.8 cm) Weight: 195 lb 12.3 oz (88.8 kg) IBW/kg (Calculated) : 73 Heparin Dosing Weight: 78 kg  Vital Signs: Temp: 98.1 F (36.7 C) (06/07 1237) Temp src: Oral (06/07 1237) BP: 82/47 mmHg (06/07 1237) Pulse Rate: 57 (06/07 1237)  Labs:  Recent Labs  09/26/12 0445 09/27/12 0430 09/28/12 0405 09/28/12 1104  HGB 13.5 13.1 13.3  --   HCT 42.4 40.8 41.0  --   PLT 143* 155 156  --   HEPARINUNFRC 0.35 0.29* 0.28* 0.46  CREATININE 0.99 0.96 1.09  --     Estimated Creatinine Clearance: 77.8 ml/min (by C-G formula based on Cr of 1.09).  Assessment:   Heparin level is now therapeutic after increase from 1300 to 1450 units/hr early this morning.  CBC stable. No bleeding reported.  Goal of Therapy:  Heparin level 0.3-0.7 units/ml Monitor platelets by anticoagulation protocol: Yes   Plan:   Continue heparin drip at 1450 units/hr.  Daily heparin level and CBC while on heparin.  Dennie Fetters, [place] Pager: [phone] 09/28/2012,1:23 PM

## 2012-09-28 NOTE — Progress Notes (Signed)
ANTICOAGULATION CONSULT NOTE - Follow Up Consult  Pharmacy Consult for heparin Indication: atrial fibrillation  Labs:  Recent Labs  09/26/12 0445 09/27/12 0430 09/28/12 0405  HGB 13.5 13.1 13.3  HCT 42.4 40.8 41.0  PLT 143* 155 156  HEPARINUNFRC 0.35 0.29* 0.28*  CREATININE 0.99 0.96  --     Assessment: 62yo male remains subtherapeutic with slightly lower level despite rate increase yesterday.  Goal of Therapy:  Heparin level 0.3-0.7 units/ml   Plan:  Will increase heparin gtt by 2 units/kg/hr to 1450 units/hr and check level in 6hr.  Vernard Gambles, PharmD, BCPS  09/28/2012,5:53 AM

## 2012-09-29 DIAGNOSIS — M109 Gout, unspecified: Secondary | ICD-10-CM

## 2012-09-29 LAB — CBC
HCT: 41.5 % (ref 39.0–52.0)
MCH: 30.1 pg (ref 26.0–34.0)
MCV: 91.2 fL (ref 78.0–100.0)
Platelets: 172 10*3/uL (ref 150–400)
RBC: 4.55 MIL/uL (ref 4.22–5.81)
WBC: 5.4 10*3/uL (ref 4.0–10.5)

## 2012-09-29 LAB — BASIC METABOLIC PANEL
BUN: 23 mg/dL (ref 6–23)
CO2: 31 mEq/L (ref 19–32)
Calcium: 8.4 mg/dL (ref 8.4–10.5)
Chloride: 96 mEq/L (ref 96–112)
Creatinine, Ser: 1 mg/dL (ref 0.50–1.35)
Glucose, Bld: 104 mg/dL — ABNORMAL HIGH (ref 70–99)

## 2012-09-29 MED ORDER — COLCHICINE 0.6 MG PO TABS
0.6000 mg | ORAL_TABLET | Freq: Once | ORAL | Status: AC
  Administered 2012-09-29: 0.6 mg via ORAL
  Filled 2012-09-29: qty 1

## 2012-09-29 MED ORDER — COLCHICINE 0.6 MG PO TABS
1.2000 mg | ORAL_TABLET | Freq: Once | ORAL | Status: AC
  Administered 2012-09-29: 1.2 mg via ORAL
  Filled 2012-09-29: qty 2

## 2012-09-29 NOTE — Progress Notes (Signed)
[facility]  Subjective: Pt complains of left knee pain. He thinks it is a possible gout flare. He usually has relief with Tylenol but it did not help last PM. He was given morphine with moderate relief.   Objective: Vital signs in last 24 hours: Temp:  [97.9 F (36.6 C)-98.1 F (36.7 C)] 98 F (36.7 C) (06/08 0331) Pulse Rate:  [57-112] 112 (06/08 0331) Resp:  [18-20] 18 (06/08 0331) BP: (82-113)/(47-76) 113/76 mmHg (06/08 0331) SpO2:  [93 %-99 %] 98 % (06/08 0331) Weight:  [194 lb (87.998 kg)] 194 lb (87.998 kg) (06/08 0331) Last BM Date: 09/27/12  Intake/Output from previous day: 06/07 0701 - 06/08 0700 In: 792.5 [P.O.:720; I.V.:72.5] Out: 950 [Urine:950] Intake/Output this shift:    Medications Current Facility-Administered Medications  Medication Dose Route Frequency Provider Last Rate Last Dose  . 0.9 %  sodium chloride infusion   Intravenous Continuous PRN Lennette Bihari, MD      . 0.9 %  sodium chloride infusion   Intravenous Continuous Delight Ovens, MD      . acetaminophen (TYLENOL) tablet 650 mg  650 mg Oral Q4H PRN Runell Gess, MD   650 mg at 09/28/12 2203  . antiseptic oral rinse (BIOTENE) solution 15 mL  15 mL Mouth Rinse BID Lennette Bihari, MD      . aspirin EC tablet 81 mg  81 mg Oral Daily Nada Boozer, NP   81 mg at 09/28/12 0948  . atorvastatin (LIPITOR) tablet 40 mg  40 mg Oral q1800 Nada Boozer, NP   40 mg at 09/28/12 1812  . budesonide (PULMICORT) nebulizer solution 0.5 mg  0.5 mg Nebulization BID Jeanella Craze, NP   0.5 mg at 09/28/12 2017  . cephALEXin (KEFLEX) capsule 500 mg  500 mg Oral Q6H Judyann Munson, MD   500 mg at 09/29/12 1610  . digoxin (LANOXIN) tablet 0.25 mg  0.25 mg Oral Daily Eda Paschal Kilroy, PA-C   0.25 mg at 09/28/12 0948  . furosemide (LASIX) injection 40 mg  40 mg Intravenous BID Wilburt Finlay, PA-C   40 mg at 09/28/12 1813  . heparin ADULT infusion 100 units/mL (25000 units/250 mL)  1,450  Units/hr Intravenous Continuous Colleen Can, RPH 14.5 mL/hr at 09/28/12 1809 1,450 Units/hr at 09/28/12 1809  . ipratropium (ATROVENT) nebulizer solution 0.5 mg  0.5 mg Nebulization BID Lennette Bihari, MD   0.5 mg at 09/28/12 2017  . irbesartan (AVAPRO) tablet 75 mg  75 mg Oral Daily Lennette Bihari, MD   75 mg at 09/28/12 0948  . levalbuterol (XOPENEX) nebulizer solution 0.63 mg  0.63 mg Nebulization Q4H PRN Merwyn Katos, MD      . levalbuterol Pauline Aus) nebulizer solution 0.63 mg  0.63 mg Nebulization BID Lennette Bihari, MD   0.63 mg at 09/28/12 2018  . LORazepam (ATIVAN) tablet 0.5 mg  0.5 mg Oral Q4H PRN Nada Boozer, NP   0.5 mg at 09/26/12 2200  . metoprolol tartrate (LOPRESSOR) tablet 25 mg  25 mg Oral Q8H Nada Boozer, NP   25 mg at 09/29/12 9604  . mirtazapine (REMERON) tablet 15 mg  15 mg Oral QHS Lennette Bihari, MD   15 mg at 09/28/12 2202  . morphine 2 MG/ML injection 2 mg  2 mg Intravenous Q1H PRN Runell Gess, MD   2 mg at 09/29/12 0122  . nitroGLYCERIN (NITROSTAT) SL tablet 0.4 mg  0.4 mg Sublingual Q5  Min x 3 PRN Nada Boozer, NP      . ondansetron [facility]) injection 4 mg  4 mg Intravenous Q6H PRN Runell Gess, MD      . potassium chloride SA (K-DUR,KLOR-CON) CR tablet 20 mEq  20 mEq Oral Daily Runell Gess, MD   20 mEq at 09/28/12 0948    PE: General appearance: alert, cooperative and no distress Lungs: clear to auscultation bilaterally Heart: irregularly irregular rhythm Extremities: 2+ bilateral LEE, Left Knee: no effusion, no erythema. Mildly tender along lateral aspect. There is mild increased warmth along the lateral aspect of the left knee, compared to the contralateral knee Pulses: 2+ and symmetric Skin: warm and dry Neurologic: Grossly normal  Lab Results:   Recent Labs  09/27/12 0430 09/28/12 0405 09/29/12 0355  WBC 4.9 5.3 5.4  HGB 13.1 13.3 13.7  HCT 40.8 41.0 41.5  PLT 155 156 172   BMET  Recent Labs  09/27/12 0430  09/28/12 0405 09/29/12 0355  NA 138 136 135  K 4.2 4.0 4.4  CL 99 97 96  CO2 33* 31 31  GLUCOSE 91 99 104*  BUN 17 19 23   CREATININE 0.96 1.09 1.00  CALCIUM 8.7 8.8 8.4   BNP (last 3 results)  Recent Labs  09/17/12 0355 09/24/12 0354 09/28/12 0405  PROBNP 26816.0* 15462.0* 12365.0*    Assessment/Plan  Principal Problem:   Acute respiratory failure Active Problems:   Atrial fibrillation with RVR, was in atrial fib in 04/2011   HTN (hypertension)   Hyperlipidemia   Noncompliance, no meds for two weeks prior to admission   Tobacco use   Acute combined systolic and diastolic CHF, NYHA class 3 -- previous normal LV function and moderate AS- re-evaluate; EF now 30-35% with regional WMA   Bilateral lower extremity edema, secondary to acute CHF   Aortic stenosis, severe   CAD (coronary artery disease), single vessel disease of RCA/PDA, mild disease of LM   Cellulitis, lower extremity- treated   PAD (peripheral artery disease), decreased bil. ABIs   Chronic obstructive pulmonary disease   Cardiomyopathy, ischemic- 30-35% 2D   Plan: Awaiting CABG, AVR and Maze procedure. Tentatively scheduled for 10/08/12. Surgeon will be Dr. Tyrone Sage. In the interim, will continue with diuresis. Pt still has significant bilateral LEE. BNP yesterday was 12,365. Continue with 40 mg IV Lasix BID. Will recheck BNP in 2 days. Renal function is stable, w/ Scr of 1.0. BP is stable. He remains in a-fib. HR is in the upper 90s-low 100s. Continue w/ BB and Lanoxin. Labs are WNL. Left knee pain is moderately improved with morphine. Pt reports history of gout. This is likely a recurrence, subsequent to aggressive diuresis. ? The use of NSAID. Will have MD to evaluate. Will continue to monitor.     LOS: 13 days    Brittainy M. Delmer Islam 09/29/2012 7:51 AM  I have seen and examined the patient along with Brittainy M. Sharol Harness, PA-C.  I have reviewed the chart, notes and new data.  I agree with PA's  note.  Key new complaints: tender, painful left knee; breathing well overnight, but couldn't sleep due to knee. Breathing deteriorated a little this AM. Key examination changes: edema diminished, but still 2-3+ at ankle level. Irregular rhythm, AS murmur, mid peaking. Left knee is warm, but not red or swollen. Key new findings / data: was in NSR overnight, back in AF w VR around 100 at 6:30 AM. BNP lower, but still >12000.  PLAN: Colchicine  for probable left knee gout attack. Will try to avoid NSAIDs as they might interfere with diuresis. Prednisone if colchicine is ineffective or poorly tolerated. Still needs diuresis before he is ready for surgery.  Presence of NSR overnight is encouraging sign for success of MAZE postop.   Thurmon Fair, MD, [facility] [phone] 09/29/2012, 8:35 AM

## 2012-09-29 NOTE — Progress Notes (Signed)
ANTICOAGULATION CONSULT NOTE - Follow Up Consult  Pharmacy Consult for Heparin Indication: atrial fibrillation  No Known Allergies  Patient Measurements: Height: 5\' 10"  (177.8 cm) Weight: 194 lb (87.998 kg) IBW/kg (Calculated) : 73 Heparin Dosing Weight: 78 kg  Vital Signs: Temp: 98 F (36.7 C) (06/08 0331) Temp src: Oral (06/08 0331) BP: 113/76 mmHg (06/08 0331) Pulse Rate: 112 (06/08 0331)  Labs:  Recent Labs  09/27/12 0430 09/28/12 0405 09/28/12 1104 09/29/12 0355  HGB 13.1 13.3  --  13.7  HCT 40.8 41.0  --  41.5  PLT 155 156  --  172  HEPARINUNFRC 0.29* 0.28* 0.46 0.32  CREATININE 0.96 1.09  --  1.00    Estimated Creatinine Clearance: 84.5 ml/min (by C-G formula based on Cr of 1).  Assessment:   Heparin level is low therapeutic (0.32) on 1450 units/hr, but down from 0.46 on same rate yesterday afternoon. Drip was increased early am 6/7 when level 0.28, just below goal. CBC stable; platelet count trended up a bit. No bleeding reported.  Goal of Therapy:  Heparin level 0.3-0.7 units/ml Monitor platelets by anticoagulation protocol: Yes   Plan:   Increase heparin drip to 1550 units/hr to try to keep level in goal range.  Continue daily heparin level and CBC while on heparin.  Dennie Fetters, [place] Pager: [phone] 09/29/2012,11:29 AM

## 2012-09-29 NOTE — Progress Notes (Signed)
Pt declined walk this morning. Pt states his knee is hurting too bad. Pt says he may be able to walk this afternoon once medication has had time to work.

## 2012-09-30 ENCOUNTER — Encounter (HOSPITAL_COMMUNITY): Payer: Self-pay | Admitting: Cardiology

## 2012-09-30 DIAGNOSIS — M109 Gout, unspecified: Secondary | ICD-10-CM

## 2012-09-30 HISTORY — DX: Gout, unspecified: M10.9

## 2012-09-30 LAB — BASIC METABOLIC PANEL
BUN: 21 mg/dL (ref 6–23)
Creatinine, Ser: 1.06 mg/dL (ref 0.50–1.35)
GFR calc Af Amer: 84 mL/min — ABNORMAL LOW (ref >90)
GFR calc non Af Amer: 73 mL/min — ABNORMAL LOW (ref >90)
Glucose, Bld: 98 mg/dL (ref 70–99)
Potassium: 4.2 mEq/L (ref 3.5–5.1)

## 2012-09-30 LAB — CBC
HCT: 42.8 % (ref 39.0–52.0)
Hemoglobin: 14.2 g/dL (ref 13.0–17.0)
MCH: 30.2 pg (ref 26.0–34.0)
MCHC: 33.2 g/dL (ref 30.0–36.0)
MCV: 91.1 fL (ref 78.0–100.0)
RDW: 14.6 % (ref 11.5–15.5)

## 2012-09-30 LAB — HEPARIN LEVEL (UNFRACTIONATED): Heparin Unfractionated: 0.64 IU/mL (ref 0.30–0.70)

## 2012-09-30 MED ORDER — METOPROLOL TARTRATE 25 MG PO TABS
37.5000 mg | ORAL_TABLET | Freq: Three times a day (TID) | ORAL | Status: DC
  Administered 2012-09-30 – 2012-10-07 (×21): 37.5 mg via ORAL
  Filled 2012-09-30 (×27): qty 1

## 2012-09-30 NOTE — Progress Notes (Signed)
ANTICOAGULATION CONSULT NOTE - Follow Up Consult  Pharmacy Consult for Heparin Indication: atrial fibrillation  No Known Allergies  Patient Measurements: Height: 5\' 10"  (177.8 cm) Weight: 189 lb 9.5 oz (86 kg) IBW/kg (Calculated) : 73 Heparin Dosing Weight: 78 kg  Vital Signs: Temp: 98.5 F (36.9 C) (06/09 0418) Temp src: Oral (06/09 0418) BP: 117/64 mmHg (06/09 0528) Pulse Rate: 88 (06/09 0528)  Labs:  Recent Labs  09/28/12 0405 09/28/12 1104 09/29/12 0355 09/30/12 0405  HGB 13.3  --  13.7 14.2  HCT 41.0  --  41.5 42.8  PLT 156  --  172 203  HEPARINUNFRC 0.28* 0.46 0.32 0.64  CREATININE 1.09  --  1.00 1.06    Estimated Creatinine Clearance: 73.7 ml/min (by C-G formula based on Cr of 1.06).  Assessment: 62 yo M continues on heparin infusion for afib.  Pt is awaiting CABG, AVR, and MAZE procedure which is tentatively planned for 10/08/12.  Heparin level is therapeutic on 1550 units/hr.  CBC stable.  No bleeding noted.  Goal of Therapy:  Heparin level 0.3-0.7 units/ml Monitor platelets by anticoagulation protocol: Yes   Plan:  Continue heparin at 1550 units/hr. Continue daily heparin level and CBC.  [employer], Pharm.D., BCPS Clinical Pharmacist Pager [phone] 09/30/2012 9:23 AM

## 2012-09-30 NOTE — Progress Notes (Signed)
Physical Therapy Treatment Patient Details Name: [name] MRN: [id number] DOB: 12/13/1950 Today's Date: 09/30/2012 Time: 1025-1050 PT Time Calculation (min): 25 min  PT Assessment / Plan / Recommendation Comments on Treatment Session  Patient tolerating ambulation today after couple of days with gout flare in left knee affecting mobility.  HR up to 133 with ambulation without O2.  Feel improving despite limited mobility over weekend.    Follow Up Recommendations  Home health PT;Supervision/Assistance - 24 hour           Equipment Recommendations  Rolling walker with 5" wheels       Frequency Min 3X/week   Plan Discharge plan remains appropriate    Precautions / Restrictions Precautions Precautions: None   Pertinent Vitals/Pain 2/10 left knee with ambulation    Mobility  Bed Mobility Supine to Sit: 6: Modified independent (Device/Increase time) Sitting - Scoot to Edge of Bed: 6: Modified independent (Device/Increase time) Transfers Sit to Stand: 5: Supervision;From bed;With upper extremity assist Stand to Sit: 5: Supervision;To chair/3-in-1;With armrests Details for Transfer Assistance: cues for hand placement Ambulation/Gait Ambulation/Gait Assistance: 4: Min guard;5: Supervision Ambulation Distance (Feet): 200 Feet Assistive device: Rolling walker Ambulation/Gait Assistance Details: minguard for balance and due to antalgic gait pattern Gait Pattern: Antalgic;Step-through pattern;Trunk flexed;Wide base of support    Exercises General Exercises - Lower Extremity Long Arc Quad: AROM;Both;10 reps;Seated Hip Flexion/Marching: AROM;Both;10 reps;Seated Toe Raises: AROM;Both;10 reps;Seated Heel Raises: AROM;Both;10 reps;Seated     PT Goals Acute Rehab PT Goals Pt will go Supine/Side to Sit: Independently PT Goal: Supine/Side to Sit - Progress: Met Pt will go Sit to Stand: Independently PT Goal: Sit to Stand - Progress: Progressing toward goal PT Goal: Stand to  Sit - Progress: Progressing toward goal Pt will Ambulate: >150 feet;with least restrictive assistive device;with modified independence PT Goal: Ambulate - Progress: Progressing toward goal  Visit Information  Last PT Received On: 09/30/12    Subjective Data  Subjective: I better walk today; legs are getting real weak.  Knee pain better, slept good.   Cognition  Cognition Arousal/Alertness: Awake/alert Behavior During Therapy: WFL for tasks assessed/performed Overall Cognitive Status: Within Functional Limits for tasks assessed       End of Session PT - End of Session Equipment Utilized During Treatment: Gait belt Activity Tolerance: Patient tolerated treatment well Patient left: in chair;with call bell/phone within reach;with family/visitor present   GP     [facility] 09/30/2012, 11:16 AM Sheran Lawless, PT [phone] 09/30/2012

## 2012-09-30 NOTE — Progress Notes (Signed)
Subjective: Complaints of gout, now resolved with colchicine yesterday.   Objective: Vital signs in last 24 hours: Temp:  [98.5 F (36.9 C)-100.4 F (38 C)] 98.5 F (36.9 C) (06/09 0418) Pulse Rate:  [76-121] 88 (06/09 0528) Resp:  [18] 18 (06/09 0418) BP: (99-117)/(64-74) 117/64 mmHg (06/09 0528) SpO2:  [92 %-94 %] 94 % (06/09 0959) Weight:  [189 lb 9.5 oz (86 kg)] 189 lb 9.5 oz (86 kg) (06/09 0418) Weight change: -4 lb 6.5 oz (-1.998 kg) Last BM Date: 09/28/12 Intake/Output from previous day: -420 (total since admit -19.129)  Wt 189.9 total drop from 230 since admit. 06/08 0701 - 06/09 0700 In: 840 [P.O.:840] Out: 1260 [Urine:1260] Intake/Output this shift: Total I/O In: 360 [P.O.:360] Out: 400 [Urine:400]  PE: General:Pleasant affect, NAD Skin:Warm and dry, brisk capillary refill, less erythema with lower Ext. Heart:irreg irreg with 3/6 squeaky murmur, no gallup, rub or click Lungs:diminished in bases bil, with few crackles, no rhonchi, or wheezes FAO:ZHYQM,VHQI, non tender, + BS, do not palpate liver spleen or masses ONG:EXBMW with 2+ lower ext edema though now down below midcalf instead of to hips and above. Neuro:alert and oriented, MAE, follows commands, + facial symmetry  TELE: A fib at rest in bed HR 120s.  Not yet ambulated.  Lab Results:  Recent Labs  09/29/12 0355 09/30/12 0405  WBC 5.4 5.9  HGB 13.7 14.2  HCT 41.5 42.8  PLT 172 203   BMET  Recent Labs  09/29/12 0355 09/30/12 0405  NA 135 136  K 4.4 4.2  CL 96 98  CO2 31 32  GLUCOSE 104* 98  BUN 23 21  CREATININE 1.00 1.06  CALCIUM 8.4 9.1   No results found for this basename: TROPONINI, CK, MB,  in the last 72 hours  Lab Results  Component Value Date   CHOL 145 09/17/2012   HDL 47 09/17/2012   LDLCALC 83 09/17/2012   TRIG 77 09/17/2012   CHOLHDL 3.1 09/17/2012   Lab Results  Component Value Date   HGBA1C 5.4 09/17/2012     Lab Results  Component Value Date   TSH 1.994 09/17/2012       Studies/Results: No results found.  Medications: I have reviewed the patient's current medications. Marland Kitchen antiseptic oral rinse  15 mL Mouth Rinse BID  . aspirin EC  81 mg Oral Daily  . atorvastatin  40 mg Oral q1800  . budesonide (PULMICORT) nebulizer solution  0.5 mg Nebulization BID  . cephALEXin  500 mg Oral Q6H  . digoxin  0.25 mg Oral Daily  . furosemide  40 mg Intravenous BID  . ipratropium  0.5 mg Nebulization BID  . irbesartan  75 mg Oral Daily  . levalbuterol  0.63 mg Nebulization BID  . metoprolol tartrate  25 mg Oral Q8H  . mirtazapine  15 mg Oral QHS  . potassium chloride  20 mEq Oral Daily   Assessment/Plan: Principal Problem:   Acute respiratory failure, with BiPAP needed on admit Active Problems:   Atrial fibrillation with RVR, was in atrial fib in 04/2011   Acute combined systolic and diastolic CHF, NYHA class 3 -- previous normal LV function and moderate AS- re-evaluate; EF now 30-35% with regional WMA   Aortic stenosis, severe   CAD (coronary artery disease), single vessel disease of RCA/PDA, mild disease of LM   HTN (hypertension)   Hyperlipidemia   Noncompliance, no meds for two weeks prior to admission   Tobacco use   Bilateral lower  extremity edema, secondary to acute CHF   Cellulitis, lower extremity- treated   PAD (peripheral artery disease), decreased bil. ABIs   Chronic obstructive pulmonary disease   Cardiomyopathy, ischemic- 30-35% 2D    Gout flare. 09/29/12, Lt knee, improved with colchicine.  PLAN: Low grade fever yesterday at 100.4, now afebrile.  Continues of Lasix 40 IV BID. Difficult to increase BB due to borderline BP. Slow improvement for CABG/AVR next week.   LOS: 14 days   Time spent with pt. :15 minutes. [facility] R  Nurse Practitioner Certified Pager [phone] 09/30/2012, 11:05 AM   I have seen and examined the patient along with [facility] R  Nurse Practitioner Certified.  I have reviewed the chart, notes and new data.  I  agree with NP's note.  Key new complaints: knee is better Key examination changes: no overt inflammatory changes of knee Key new findings / data: stable creat  PLAN: Continue slow diuresis. Ventricular rate still a little fast. BP a little higher than earlier in the morning. Will gently increase beta blocker.  Thurmon Fair, MD, [facility] [phone] 09/30/2012, 1:02 PM

## 2012-10-01 LAB — BASIC METABOLIC PANEL
CO2: 30 mEq/L (ref 19–32)
Calcium: 8.9 mg/dL (ref 8.4–10.5)
Creatinine, Ser: 1.03 mg/dL (ref 0.50–1.35)
GFR calc non Af Amer: 75 mL/min — ABNORMAL LOW (ref >90)
Glucose, Bld: 111 mg/dL — ABNORMAL HIGH (ref 70–99)

## 2012-10-01 LAB — CBC
MCH: 28.9 pg (ref 26.0–34.0)
MCHC: 31.3 g/dL (ref 30.0–36.0)
MCV: 92.1 fL (ref 78.0–100.0)
Platelets: 200 10*3/uL (ref 150–400)
RBC: 4.54 MIL/uL (ref 4.22–5.81)

## 2012-10-01 LAB — PRO B NATRIURETIC PEPTIDE: Pro B Natriuretic peptide (BNP): 11193 pg/mL — ABNORMAL HIGH (ref 0–125)

## 2012-10-01 MED ORDER — FUROSEMIDE 10 MG/ML IJ SOLN
60.0000 mg | Freq: Two times a day (BID) | INTRAMUSCULAR | Status: AC
  Administered 2012-10-01 – 2012-10-06 (×10): 60 mg via INTRAVENOUS
  Filled 2012-10-01 (×10): qty 6

## 2012-10-01 MED ORDER — FUROSEMIDE 10 MG/ML IJ SOLN
INTRAMUSCULAR | Status: AC
  Filled 2012-10-01: qty 4

## 2012-10-01 MED ORDER — DICLOFENAC SODIUM 1 % TD GEL
4.0000 g | Freq: Four times a day (QID) | TRANSDERMAL | Status: AC
  Administered 2012-10-01 – 2012-10-05 (×18): 4 g via TOPICAL
  Filled 2012-10-01: qty 100

## 2012-10-01 NOTE — Progress Notes (Signed)
ANTICOAGULATION CONSULT NOTE - Follow Up Consult  Pharmacy Consult for Heparin Indication: atrial fibrillation  No Known Allergies  Patient Measurements: Height: 5\' 10"  (177.8 cm) Weight: 188 lb 14.4 oz (85.684 kg) IBW/kg (Calculated) : 73 Heparin Dosing Weight: 78 kg  Vital Signs: Temp: 98.5 F (36.9 C) (06/10 0528) Temp src: Oral (06/10 0528) BP: 98/59 mmHg (06/10 0528) Pulse Rate: 94 (06/10 0528)  Labs:  Recent Labs  09/29/12 0355 09/30/12 0405 10/01/12 0451  HGB 13.7 14.2 13.1  HCT 41.5 42.8 41.8  PLT 172 203 200  HEPARINUNFRC 0.32 0.64 0.34  CREATININE 1.00 1.06 1.03    Estimated Creatinine Clearance: 75.8 ml/min (by C-G formula based on Cr of 1.03).  Assessment: 62 yo M continues on heparin infusion for afib.  Pt is awaiting CABG, AVR, and MAZE procedure which is tentatively planned for 10/08/12.  Heparin level is therapeutic on 1550 units/hr.  CBC stable.  No bleeding noted.  Goal of Therapy:  Heparin level 0.3-0.7 units/ml Monitor platelets by anticoagulation protocol: Yes   Plan:  Continue heparin at 1550 units/hr. Continue daily heparin level and CBC.  [employer], Pharm.D., BCPS Clinical Pharmacist Pager [phone] 10/01/2012 9:05 AM

## 2012-10-01 NOTE — Progress Notes (Signed)
CARDIAC REHAB PHASE I   PRE:  Rate/Rhythm: 113 afib  BP:  Supine:   Sitting: 90/60  Standing:    SaO2: 100%RA  MODE:  Ambulation: 200 ft   POST:  Rate/Rhythm: 135 afib  BP:  Supine:   Sitting: 97/68  Standing:    SaO2: 92%RA 1005-1032 Pt walked 200 ft on RA with rolling walker and asst x 1. Gait steady but left knee very painful from gout. Pt having to put pressure on arms to walk. No c/o Cp or palpitations with HR to 135. To recliner after walk.   Luetta Nutting, RN BSN  10/01/2012 10:29 AM

## 2012-10-01 NOTE — Progress Notes (Signed)
Subjective:  He is still having some Rt knee pain, no SOB  Objective:  Vital Signs in the last 24 hours: Temp:  [98.1 F (36.7 C)-98.6 F (37 C)] 98.5 F (36.9 C) (06/10 0528) Pulse Rate:  [82-104] 94 (06/10 0528) Resp:  [18-20] 20 (06/10 0528) BP: (89-108)/(0-70) 98/59 mmHg (06/10 0528) SpO2:  [93 %-98 %] 93 % (06/10 0528) Weight:  [188 lb 14.4 oz (85.684 kg)] 188 lb 14.4 oz (85.684 kg) (06/10 0528)  Intake/Output from previous day:  Intake/Output Summary (Last 24 hours) at 10/01/12 1147 Last data filed at 10/01/12 0740  Gross per 24 hour  Intake    720 ml  Output   1300 ml  Net   -580 ml    Physical Exam: General appearance: alert, cooperative and no distress Lungs: decreased breath sounds c/w COPD Heart: irregularly irregular rhythm and 2/6 systolic murmur Extrem: 1+ pitting edema on Rt, 1-2+ on Lt   Rate: 90  Rhythm: atrial fibrillation  Lab Results:  Recent Labs  09/30/12 0405 10/01/12 0451  WBC 5.9 4.7  HGB 14.2 13.1  PLT 203 200    Recent Labs  09/30/12 0405 10/01/12 0451  NA 136 136  K 4.2 3.7  CL 98 99  CO2 32 30  GLUCOSE 98 111*  BUN 21 22  CREATININE 1.06 1.03   No results found for this basename: TROPONINI, CK, MB,  in the last 72 hours Hepatic Function Panel No results found for this basename: PROT, ALBUMIN, AST, ALT, ALKPHOS, BILITOT, BILIDIR, IBILI,  in the last 72 hours No results found for this basename: CHOL,  in the last 72 hours No results found for this basename: INR,  in the last 72 hours  Imaging: Imaging results have been reviewed, CHF on 09/25/12  Cardiac Studies:  Assessment/Plan:   Principal Problem:   Acute respiratory failure, with BiPAP needed on admit Active Problems:   PAF (paroxysmal atrial fibrillation), he has had periods of NSR since admission   Acute combined systolic and diastolic CHF, NYHA class 3 -- previous normal LV function and moderate AS- re-evaluate; EF now 30-35% with regional WMA   Aortic  stenosis, severe   CAD, RCA/PDA, 30-40% LM at cath 09/20/12   Bilateral lower extremity edema, secondary to acute CHF   Cellulitis, lower extremity- treated   PAD (peripheral artery disease), decreased bil. ABIs   Chronic obstructive pulmonary disease   Cardiomyopathy, ischemic- 30-35% 2D    HTN (hypertension)   Hyperlipidemia   Noncompliance, no meds for two weeks prior to admission   Tobacco use   Gout flare. 09/29/12, Lt knee, improved with colchicine.    PLAN: Consider pushing diuretics as his output has tapered off and he still have edema on exam, BNP 11k today- down from 32k on admission. He is for CABG/AVR 10/08/12. Try Diclofenac gel for his Rt knee pain.  Corine Shelter PA-C [facility] [phone] 10/01/2012, 11:47 AM   Agree with note written by Corine Shelter [facility]  Looks great. Awaiting surgery a week from today. Diuresis has slowed down and BNP has come down slowly. Still has 2+ BLE pitting edema. Will increase lasix slightly.   Runell Gess 10/01/2012 4:55 PM

## 2012-10-02 LAB — CBC
HCT: 42.1 % (ref 39.0–52.0)
Hemoglobin: 13.5 g/dL (ref 13.0–17.0)
MCH: 29.5 pg (ref 26.0–34.0)
MCHC: 32.1 g/dL (ref 30.0–36.0)
RDW: 14.9 % (ref 11.5–15.5)

## 2012-10-02 LAB — BASIC METABOLIC PANEL
BUN: 23 mg/dL (ref 6–23)
Calcium: 9 mg/dL (ref 8.4–10.5)
GFR calc non Af Amer: 64 mL/min — ABNORMAL LOW (ref >90)
Glucose, Bld: 113 mg/dL — ABNORMAL HIGH (ref 70–99)

## 2012-10-02 NOTE — Progress Notes (Signed)
[facility]  Subjective: Breathing ok. Ambulating without difficulty. Left knee has improved significantly.  Objective: Vital signs in last 24 hours: Temp:  [97.6 F (36.4 C)-99.5 F (37.5 C)] 97.6 F (36.4 C) (06/11 0412) Pulse Rate:  [62-97] 94 (06/11 0412) Resp:  [18-20] 20 (06/11 0412) BP: (92-106)/(52-73) 100/65 mmHg (06/11 0412) SpO2:  [93 %-100 %] 96 % (06/11 0712) Weight:  [188 lb 7.9 oz (85.5 kg)] 188 lb 7.9 oz (85.5 kg) (06/11 0712) Last BM Date: 10/01/12  Intake/Output from previous day: 06/10 0701 - 06/11 0700 In: 840 [P.O.:840] Out: 1400 [Urine:1400] Intake/Output this shift: Total I/O In: 360 [P.O.:360] Out: -   Medications Current Facility-Administered Medications  Medication Dose Route Frequency Provider Last Rate Last Dose  . 0.9 %  sodium chloride infusion   Intravenous Continuous PRN Lennette Bihari, MD      . 0.9 %  sodium chloride infusion   Intravenous Continuous Delight Ovens, MD      . acetaminophen (TYLENOL) tablet 650 mg  650 mg Oral Q4H PRN Runell Gess, MD   650 mg at 09/28/12 2203  . antiseptic oral rinse (BIOTENE) solution 15 mL  15 mL Mouth Rinse BID Lennette Bihari, MD   15 mL at 10/01/12 2000  . aspirin EC tablet 81 mg  81 mg Oral Daily Nada Boozer, NP   81 mg at 10/02/12 1038  . atorvastatin (LIPITOR) tablet 40 mg  40 mg Oral q1800 Nada Boozer, NP   40 mg at 10/01/12 1800  . budesonide (PULMICORT) nebulizer solution 0.5 mg  0.5 mg Nebulization BID Jeanella Craze, NP   0.5 mg at 10/02/12 4098  . cephALEXin (KEFLEX) capsule 500 mg  500 mg Oral Q6H Judyann Munson, MD   500 mg at 10/02/12 1039  . diclofenac sodium (VOLTAREN) 1 % transdermal gel 4 g  4 g Topical QID Abelino Derrick, PA-C   4 g at 10/02/12 1040  . digoxin (LANOXIN) tablet 0.25 mg  0.25 mg Oral Daily Eda Paschal Kilroy, PA-C   0.25 mg at 10/02/12 1040  . furosemide (LASIX) injection 60 mg  60 mg Intravenous BID Runell Gess, MD   60 mg at 10/02/12  1040  . heparin ADULT infusion 100 units/mL (25000 units/250 mL)  1,600 Units/hr Intravenous Continuous Judie Bonus Hammons, RPH 15.5 mL/hr at 10/02/12 0630 1,550 Units/hr at 10/02/12 0630  . ipratropium (ATROVENT) nebulizer solution 0.5 mg  0.5 mg Nebulization BID Lennette Bihari, MD   0.5 mg at 10/02/12 1191  . irbesartan (AVAPRO) tablet 75 mg  75 mg Oral Daily Lennette Bihari, MD   75 mg at 10/02/12 1039  . levalbuterol (XOPENEX) nebulizer solution 0.63 mg  0.63 mg Nebulization Q4H PRN Merwyn Katos, MD      . levalbuterol Pauline Aus) nebulizer solution 0.63 mg  0.63 mg Nebulization BID Lennette Bihari, MD   0.63 mg at 10/02/12 4782  . LORazepam (ATIVAN) tablet 0.5 mg  0.5 mg Oral Q4H PRN Nada Boozer, NP   0.5 mg at 09/26/12 2200  . metoprolol tartrate (LOPRESSOR) tablet 37.5 mg  37.5 mg Oral Q8H Mihai Croitoru, MD   37.5 mg at 10/02/12 0631  . mirtazapine (REMERON) tablet 15 mg  15 mg Oral QHS Lennette Bihari, MD   15 mg at 10/01/12 2307  . morphine 2 MG/ML injection 2 mg  2 mg Intravenous Q1H PRN Runell Gess, MD   2 mg at  09/29/12 1818  . nitroGLYCERIN (NITROSTAT) SL tablet 0.4 mg  0.4 mg Sublingual Q5 Min x 3 PRN Nada Boozer, NP      . ondansetron [facility]) injection 4 mg  4 mg Intravenous Q6H PRN Runell Gess, MD      . potassium chloride SA (K-DUR,KLOR-CON) CR tablet 20 mEq  20 mEq Oral Daily Runell Gess, MD   20 mEq at 10/02/12 1039    PE: General appearance: alert, cooperative and no distress Lungs: decreased breath sounds Heart: irregularly irregular rhythm and 3/6 SM Extremities: 1+ LEE on the right, 2+ LEE on the left Pulses: 2+ and symmetric Skin: warm and dry Neurologic: Grossly normal  Lab Results:   Recent Labs  09/30/12 0405 10/01/12 0451 10/02/12 0350  WBC 5.9 4.7 5.0  HGB 14.2 13.1 13.5  HCT 42.8 41.8 42.1  PLT 203 200 234   BMET  Recent Labs  09/30/12 0405 10/01/12 0451 10/02/12 0350  NA 136 136 137  K 4.2 3.7 4.1  CL 98 99 99  CO2 32  30 30  GLUCOSE 98 111* 113*  BUN 21 22 23   CREATININE 1.06 1.03 1.18  CALCIUM 9.1 8.9 9.0   BNP (last 3 results)  Recent Labs  09/24/12 0354 09/28/12 0405 10/01/12 0415  PROBNP 15462.0* 12365.0* 11193.0*   Assessment/Plan  Principal Problem:   Acute respiratory failure, with BiPAP needed on admit Active Problems:   PAF (paroxysmal atrial fibrillation), he has had periods of NSR since admission   HTN (hypertension)   Hyperlipidemia   Noncompliance, no meds for two weeks prior to admission   Tobacco use   Acute combined systolic and diastolic CHF, NYHA class 3 -- previous normal LV function and moderate AS- re-evaluate; EF now 30-35% with regional WMA   Bilateral lower extremity edema, secondary to acute CHF   Aortic stenosis, severe   CAD, RCA/PDA, 30-40% LM at cath 09/20/12   Cellulitis, lower extremity- treated   PAD (peripheral artery disease), decreased bil. ABIs   Chronic obstructive pulmonary disease   Cardiomyopathy, ischemic- 30-35% 2D    Gout flare. 09/29/12, Lt knee, improved with colchicine.  Plan: net fluid loss in past 24 hrs only -560 cc, despite increase in IV Lasix to 60 mg BID. Pt still has 1-2+ bilateral LEE. Renal function is stable at 1.18. Surgery is planned for 6/17. Question if metolazone needs to be added as an adjunct to help with diuresis. Left knee pain has improved significantly with the addition of Voltaren gel. HR and BP stable. SBP is in the low 100's. He is asymptomatic. Will continue to monitor.      LOS: 16 days    [name] 10/02/2012 11:31 AM    Patient seen and examined. Agree with assessment and plan. I/O -20639 since admission; -560 yesterday. Cr 1.18. Would not use metolazone presently to avoid potential for pre-renal azotemia prior to CABG. AF rate controlled. LE edema improving; erythema essentially resolved.   Lennette Bihari, MD, [facility] 10/02/2012 12:06 PM

## 2012-10-02 NOTE — Progress Notes (Signed)
ANTICOAGULATION CONSULT NOTE - Follow Up Consult  Pharmacy Consult for Heparin Indication: atrial fibrillation  No Known Allergies  Patient Measurements: Height: 5\' 10"  (177.8 cm) Weight: 188 lb 7.9 oz (85.5 kg) IBW/kg (Calculated) : 73 Heparin Dosing Weight: 78 kg  Vital Signs: Temp: 97.6 F (36.4 C) (06/11 0412) Temp src: Oral (06/11 0412) BP: 100/65 mmHg (06/11 0412) Pulse Rate: 94 (06/11 0412)  Labs:  Recent Labs  09/30/12 0405 10/01/12 0451 10/02/12 0350  HGB 14.2 13.1 13.5  HCT 42.8 41.8 42.1  PLT 203 200 234  HEPARINUNFRC 0.64 0.34 0.31  CREATININE 1.06 1.03 1.18    Estimated Creatinine Clearance: 66.2 ml/min (by C-G formula based on Cr of 1.18).  Assessment: 62 yo M continues on heparin infusion for afib.  Pt is awaiting CABG, AVR, and MAZE procedure which is tentatively planned for 10/08/12.  Heparin level is at low-end of therapeutic goal on 1550 units/hr.  CBC stable.  No bleeding noted.  Goal of Therapy:  Heparin level 0.3-0.7 units/ml Monitor platelets by anticoagulation protocol: Yes   Plan:  Increase heparin to 1600 units/hr to maintain goal. Continue daily heparin level and CBC.  Pt continues on Keflex (day #15) for LE cellulitis which is noted as "resolved" in MD notes.  Consider discontinuing antibiotic.  [employer], Pharm.D., BCPS Clinical Pharmacist Pager [phone] 10/02/2012 11:01 AM

## 2012-10-02 NOTE — Progress Notes (Signed)
CARDIAC REHAB PHASE I   PRE:  Rate/Rhythm: 93 afib  BP:  Supine:   Sitting: 100/66  Standing:    SaO2: 93%RA  MODE:  Ambulation: 300 ft   POST:  Rate/Rhythm: 117 afib   BP:  Supine:   Sitting: 100/68  Standing:    SaO2: 98%RA 1055-1124 Pt walked 300 ft on RA with rolling walker and asst x 1 with steady gait. Tolerated well. Left knee not as sore today. To recliner after walk. Heart rate better controlled today.   Luetta Nutting, RN BSN  10/02/2012 11:20 AM

## 2012-10-03 LAB — BASIC METABOLIC PANEL
BUN: 24 mg/dL — ABNORMAL HIGH (ref 6–23)
CO2: 29 mEq/L (ref 19–32)
GFR calc non Af Amer: 67 mL/min — ABNORMAL LOW (ref >90)
Glucose, Bld: 94 mg/dL (ref 70–99)
Potassium: 4.1 mEq/L (ref 3.5–5.1)

## 2012-10-03 LAB — HEPARIN LEVEL (UNFRACTIONATED): Heparin Unfractionated: 0.73 IU/mL — ABNORMAL HIGH (ref 0.30–0.70)

## 2012-10-03 LAB — CBC
HCT: 41 % (ref 39.0–52.0)
Hemoglobin: 13.2 g/dL (ref 13.0–17.0)
MCH: 29.1 pg (ref 26.0–34.0)
MCHC: 32.2 g/dL (ref 30.0–36.0)
RBC: 4.53 MIL/uL (ref 4.22–5.81)

## 2012-10-03 MED ORDER — FUROSEMIDE 10 MG/ML IJ SOLN
INTRAMUSCULAR | Status: AC
  Filled 2012-10-03: qty 4

## 2012-10-03 NOTE — Progress Notes (Signed)
Physical Therapy Treatment Patient Details Name: [name] MRN: [id number] DOB: 1950-10-18 Today's Date: 10/03/2012 Time: 1416-1440 PT Time Calculation (min): 24 min  PT Assessment / Plan / Recommendation Comments on Treatment Session  Pt demonstrates increased activity tolerance and ambulation today.  Patient also performed ther-ex and was able to teach back ther ex that he has performed on his own. Will continue to work with patient and progress activity as tolerated.    Follow Up Recommendations  Home health PT;Supervision/Assistance - 24 hour     Does the patient have the potential to tolerate intense rehabilitation     Barriers to Discharge        Equipment Recommendations  Rolling walker with 5" wheels    Recommendations for Other Services    Frequency Min 3X/week   Plan Discharge plan remains appropriate    Precautions / Restrictions Precautions Precautions: None Restrictions Weight Bearing Restrictions: No   Pertinent Vitals/Pain No pain at this time    Mobility  Bed Mobility Bed Mobility: Not assessed Supine to Sit: 6: Modified independent (Device/Increase time) Sitting - Scoot to Edge of Bed: 6: Modified independent (Device/Increase time) Sit to Supine: 7: Independent;HOB flat Transfers Transfers: Stand to Sit;Sit to Stand Sit to Stand: 5: Supervision;From bed;With upper extremity assist Stand to Sit: 5: Supervision;To chair/3-in-1;With armrests Details for Transfer Assistance: cues for hand placement Ambulation/Gait Ambulation/Gait Assistance: 5: Supervision Ambulation Distance (Feet): 310 Feet Assistive device: Rolling walker Ambulation/Gait Assistance Details: Improved stability with 2 rest breaks, SpO2 remained >93% on rm air Gait Pattern: Antalgic;Step-through pattern;Trunk flexed;Wide base of support (antalgic secondary to gout) Gait velocity: decreased per pt report General Gait Details: Patient steady with ambulation today Stairs: No     Exercises General Exercises - Lower Extremity Ankle Circles/Pumps: AROM;Both;10 reps;Seated Long Arc Quad: AROM;Both;10 reps;Seated Hip ABduction/ADduction: AROM;Both;10 reps;Seated Hip Flexion/Marching: AROM;Both;10 reps;Seated Toe Raises: AROM;Both;10 reps;Seated Heel Raises: AROM;Both;10 reps;Seated     PT Goals Acute Rehab PT Goals PT Goal Formulation: With patient Time For Goal Achievement: 10/07/12 Potential to Achieve Goals: Good Pt will go Supine/Side to Sit: Independently Pt will go Sit to Stand: Independently PT Goal: Sit to Stand - Progress: Progressing toward goal Pt will go Stand to Sit: Independently PT Goal: Stand to Sit - Progress: Progressing toward goal Pt will Stand: Independently PT Goal: Stand - Progress: Progressing toward goal Pt will Ambulate: >150 feet;with least restrictive assistive device;with modified independence PT Goal: Ambulate - Progress: Progressing toward goal Pt will Go Up / Down Stairs: 1-2 stairs;with modified independence;with least restrictive assistive device  Visit Information  Last PT Received On: 10/03/12 Assistance Needed: +1    Subjective Data  Subjective: I just had a grandson born yesterday Patient Stated Goal: To get better and eventually get home.   Cognition  Cognition Arousal/Alertness: Awake/alert Behavior During Therapy: WFL for tasks assessed/performed Overall Cognitive Status: Within Functional Limits for tasks assessed    Balance  Balance Balance Assessed: Yes Static Sitting Balance Static Sitting - Balance Support: Bilateral upper extremity supported Static Sitting - Level of Assistance: 7: Independent Static Sitting - Comment/# of Minutes: during function ther ex in chair  End of Session PT - End of Session Equipment Utilized During Treatment: Gait belt Activity Tolerance: Patient tolerated treatment well Patient left: in chair;with call bell/phone within reach Nurse Communication: Mobility status    GP     Fabio Asa 10/03/2012, 2:56 PM Charlotte Crumb, PT DPT  [phone]

## 2012-10-03 NOTE — Progress Notes (Signed)
[facility]  Subjective: No complaints. Left knee feels great.  Objective: Vital signs in last 24 hours: Temp:  [97.4 F (36.3 C)-97.9 F (36.6 C)] 97.4 F (36.3 C) (06/12 0532) Pulse Rate:  [71-105] 102 (06/12 0635) Resp:  [18] 18 (06/12 0532) BP: (92-110)/(51-78) 110/78 mmHg (06/12 0635) SpO2:  [92 %-96 %] 96 % (06/12 0532) Last BM Date: 10/01/12  Intake/Output from previous day: 06/11 0701 - 06/12 0700 In: 1080 [P.O.:1080] Out: 2400 [Urine:2400] Intake/Output this shift:    Medications Current Facility-Administered Medications  Medication Dose Route Frequency Provider Last Rate Last Dose  . 0.9 %  sodium chloride infusion   Intravenous Continuous PRN Lennette Bihari, MD      . 0.9 %  sodium chloride infusion   Intravenous Continuous Delight Ovens, MD      . acetaminophen (TYLENOL) tablet 650 mg  650 mg Oral Q4H PRN Runell Gess, MD   650 mg at 09/28/12 2203  . antiseptic oral rinse (BIOTENE) solution 15 mL  15 mL Mouth Rinse BID Lennette Bihari, MD   15 mL at 10/03/12 0741  . aspirin EC tablet 81 mg  81 mg Oral Daily Nada Boozer, NP   81 mg at 10/02/12 1038  . atorvastatin (LIPITOR) tablet 40 mg  40 mg Oral q1800 Nada Boozer, NP   40 mg at 10/02/12 1914  . budesonide (PULMICORT) nebulizer solution 0.5 mg  0.5 mg Nebulization BID Jeanella Craze, NP   0.5 mg at 10/02/12 1921  . cephALEXin (KEFLEX) capsule 500 mg  500 mg Oral Q6H Judyann Munson, MD   500 mg at 10/03/12 [phone]  . diclofenac sodium (VOLTAREN) 1 % transdermal gel 4 g  4 g Topical QID Abelino Derrick, PA-C   4 g at 10/02/12 2216  . digoxin (LANOXIN) tablet 0.25 mg  0.25 mg Oral Daily Eda Paschal Kilroy, PA-C   0.25 mg at 10/02/12 1040  . furosemide (LASIX) injection 60 mg  60 mg Intravenous BID Runell Gess, MD   60 mg at 10/03/12 0741  . heparin ADULT infusion 100 units/mL (25000 units/250 mL)  1,600 Units/hr Intravenous Continuous Judie Bonus Hammons, RPH 16 mL/hr at  10/02/12 2225 1,600 Units/hr at 10/02/12 2225  . ipratropium (ATROVENT) nebulizer solution 0.5 mg  0.5 mg Nebulization BID Lennette Bihari, MD   0.5 mg at 10/02/12 1921  . irbesartan (AVAPRO) tablet 75 mg  75 mg Oral Daily Lennette Bihari, MD   75 mg at 10/02/12 1039  . levalbuterol (XOPENEX) nebulizer solution 0.63 mg  0.63 mg Nebulization Q4H PRN Merwyn Katos, MD      . levalbuterol Pauline Aus) nebulizer solution 0.63 mg  0.63 mg Nebulization BID Lennette Bihari, MD   0.63 mg at 10/02/12 1920  . LORazepam (ATIVAN) tablet 0.5 mg  0.5 mg Oral Q4H PRN Nada Boozer, NP   0.5 mg at 09/26/12 2200  . metoprolol tartrate (LOPRESSOR) tablet 37.5 mg  37.5 mg Oral Q8H Mihai Croitoru, MD   37.5 mg at 10/03/12 0635  . mirtazapine (REMERON) tablet 15 mg  15 mg Oral QHS Lennette Bihari, MD   15 mg at 10/02/12 2223  . morphine 2 MG/ML injection 2 mg  2 mg Intravenous Q1H PRN Runell Gess, MD   2 mg at 09/29/12 1818  . nitroGLYCERIN (NITROSTAT) SL tablet 0.4 mg  0.4 mg Sublingual Q5 Min x 3 PRN Nada Boozer, NP      .  ondansetron (ZOFRAN) injection 4 mg  4 mg Intravenous Q6H PRN Runell Gess, MD      . potassium chloride SA (K-DUR,KLOR-CON) CR tablet 20 mEq  20 mEq Oral Daily Runell Gess, MD   20 mEq at 10/02/12 1039    PE: General appearance: alert, cooperative and no distress Lungs: decreased BS at bilateral bases Heart: irregularly irregular rhythm Abdomen: soft, non-tender; bowel sounds normal; no masses,  no organomegaly Extremities: trace LEE on the right 1+ LEE on the left Pulses: 2+ and symmetric Skin: warm and dry Neurologic: Grossly normal  Lab Results:   Recent Labs  10/01/12 0451 10/02/12 0350 10/03/12 0500  WBC 4.7 5.0 5.2  HGB 13.1 13.5 13.2  HCT 41.8 42.1 41.0  PLT 200 234 272   BMET  Recent Labs  10/01/12 0451 10/02/12 0350 10/03/12 0500  NA 136 137 138  K 3.7 4.1 4.1  CL 99 99 101  CO2 30 30 29   GLUCOSE 111* 113* 94  BUN 22 23 24*  CREATININE 1.03 1.18  1.14  CALCIUM 8.9 9.0 9.2   Assessment/Plan  Principal Problem:   Acute respiratory failure, with BiPAP needed on admit Active Problems:   PAF (paroxysmal atrial fibrillation), he has had periods of NSR since admission   HTN (hypertension)   Hyperlipidemia   Noncompliance, no meds for two weeks prior to admission   Tobacco use   Acute combined systolic and diastolic CHF, NYHA class 3 -- previous normal LV function and moderate AS- re-evaluate; EF now 30-35% with regional WMA   Bilateral lower extremity edema, secondary to acute CHF   Aortic stenosis, severe   CAD, RCA/PDA, 30-40% LM at cath 09/20/12   Cellulitis, lower extremity- treated   PAD (peripheral artery disease), decreased bil. ABIs   Chronic obstructive pulmonary disease   Cardiomyopathy, ischemic- 30-35% 2D    Gout flare. 09/29/12, Lt knee, improved with colchicine.  Plan: Pt had improved diuresis yesterday. -1.32 L in past 24 hours. LEE is improving with trace edema on the right and only 1+ on the left. Renal function remains stable w/ SCr of 1.14. BP is stable. A-fib is in the 90s-low 100s. Left knee pain has resolved. Continue ambulating and improving pulmonary function for surgery, planned for 10/08/12.     LOS: 17 days    Brittainy M. Delmer Islam 10/03/2012 8:18 AM   Patient seen and examined. Agree with assessment and plan. Good result with continued diuresis; now -21630 net output since admission. Weight 230 to 188 pounds. Cr stable. Still with 2+ pitting LLE and 1+ RLE today. Rhonchi persist; no wheezing. AVR and CABG next week.   Lennette Bihari, MD, [facility] 10/03/2012 9:47 AM

## 2012-10-03 NOTE — Progress Notes (Signed)
CARDIAC REHAB PHASE I   PRE:  Rate/Rhythm: 106 afib  BP:  Supine:   Sitting: 111/52  Standing:    SaO2: 95%RA  MODE:  Ambulation: 300 ft   POST:  Rate/Rhythm: 139 afib  BP:  Supine:   Sitting: 112/52  Standing:    SaO2: 97%RA 1025-1057 Pt walked 300 ft on RA with rolling walker and asst x 1. Pt putting pressure on his arms as we walked. Discussed sternal precautions and pt stated he would start using his arms less as his knee is getting less painful. Heart rate to 139 during walk. To recliner after walk. Heart rate to 108 with rest.   Luetta Nutting, RN BSN  10/03/2012 10:51 AM

## 2012-10-03 NOTE — Progress Notes (Signed)
ANTICOAGULATION CONSULT NOTE - Follow Up Consult  Pharmacy Consult for Heparin Indication: atrial fibrillation  No Known Allergies  Patient Measurements: Height: 5\' 10"  (177.8 cm) Weight: 188 lb 7.9 oz (85.5 kg) IBW/kg (Calculated) : 73 Heparin Dosing Weight: 78 kg  Vital Signs: Temp: 97.4 F (36.3 C) (06/12 0532) Temp src: Oral (06/12 0532) BP: 110/78 mmHg (06/12 0635) Pulse Rate: 102 (06/12 0635)  Labs:  Recent Labs  10/01/12 0451 10/02/12 0350 10/03/12 0500  HGB 13.1 13.5 13.2  HCT 41.8 42.1 41.0  PLT 200 234 272  HEPARINUNFRC 0.34 0.31 0.73*  CREATININE 1.03 1.18 1.14    Estimated Creatinine Clearance: 68.5 ml/min (by C-G formula based on Cr of 1.14).  Assessment: 62 yo M continues on heparin infusion for afib.  Pt is awaiting CABG, AVR, and MAZE procedure which is tentatively planned for 10/08/12.  Heparin level is now just above goal after slight increase in rate yesterday.  Will reduce rate to 1550 units/hr.  CBC stable.  No bleeding noted.  Goal of Therapy:  Heparin level 0.3-0.7 units/ml Monitor platelets by anticoagulation protocol: Yes   Plan:  Reduce heparin to 1550 units/hr to maintain goal. Continue daily heparin level and CBC.  Pt continues on Keflex (day #16) for LE cellulitis which is noted as "resolved" in MD notes.  Consider discontinuing antibiotic.  [employer], Pharm.D., BCPS Clinical Pharmacist Pager [phone] 10/03/2012 8:25 AM

## 2012-10-04 LAB — BASIC METABOLIC PANEL WITH GFR
BUN: 21 mg/dL (ref 6–23)
CO2: 27 meq/L (ref 19–32)
Calcium: 9 mg/dL (ref 8.4–10.5)
Chloride: 100 meq/L (ref 96–112)
Creatinine, Ser: 1.06 mg/dL (ref 0.50–1.35)
GFR calc Af Amer: 84 mL/min — ABNORMAL LOW
GFR calc non Af Amer: 73 mL/min — ABNORMAL LOW
Glucose, Bld: 93 mg/dL (ref 70–99)
Potassium: 3.9 meq/L (ref 3.5–5.1)
Sodium: 136 meq/L (ref 135–145)

## 2012-10-04 LAB — CBC
HCT: 40.9 % (ref 39.0–52.0)
Hemoglobin: 13.5 g/dL (ref 13.0–17.0)
MCH: 29.9 pg (ref 26.0–34.0)
MCHC: 33 g/dL (ref 30.0–36.0)
MCV: 90.7 fL (ref 78.0–100.0)
Platelets: 262 K/uL (ref 150–400)
RBC: 4.51 MIL/uL (ref 4.22–5.81)
RDW: 14.9 % (ref 11.5–15.5)
WBC: 4.7 K/uL (ref 4.0–10.5)

## 2012-10-04 NOTE — Progress Notes (Signed)
Subjective:  No chest pain or SOB at rest  Objective:  Vital Signs in the last 24 hours: Temp:  [97.5 F (36.4 C)-98.1 F (36.7 C)] 98.1 F (36.7 C) (06/13 0547) Pulse Rate:  [61-102] 61 (06/13 0547) Resp:  [18] 18 (06/13 0547) BP: (93-102)/(54-64) 97/55 mmHg (06/13 0547) SpO2:  [94 %-97 %] 94 % (06/13 0823) Weight:  [85.9 kg (189 lb 6 oz)] 85.9 kg (189 lb 6 oz) (06/13 0547)  Intake/Output from previous day:  Intake/Output Summary (Last 24 hours) at 10/04/12 1610 Last data filed at 10/04/12 0400  Gross per 24 hour  Intake    720 ml  Output   2351 ml  Net  -1631 ml    Physical Exam: General appearance: alert, cooperative and no distress Lungs: decreased breath sounds Heart: irregularly irregular rhythm 2/6 systolic murmur   Rate: 80  Rhythm: atrial fibrillation  Lab Results:  Recent Labs  10/03/12 0500 10/04/12 0430  WBC 5.2 4.7  HGB 13.2 13.5  PLT 272 262    Recent Labs  10/03/12 0500 10/04/12 0430  NA 138 136  K 4.1 3.9  CL 101 100  CO2 29 27  GLUCOSE 94 93  BUN 24* 21  CREATININE 1.14 1.06     Imaging: Imaging results have been reviewed  Cardiac Studies:  Assessment/Plan:   Principal Problem:   Acute respiratory failure, with BiPAP needed on admit Active Problems:   PAF (paroxysmal atrial fibrillation), he has had periods of NSR since admission   Acute combined systolic and diastolic CHF, NYHA class 3 -- previous normal LV function and moderate AS- re-evaluate; EF now 30-35% with regional WMA   Aortic stenosis, severe   CAD, RCA/PDA, 30-40% LM at cath 09/20/12   Bilateral lower extremity edema, secondary to acute CHF   Cellulitis, lower extremity- treated   PAD (peripheral artery disease), decreased bil. ABIs   Chronic obstructive pulmonary disease   Cardiomyopathy, ischemic- 30-35% 2D    HTN (hypertension)   Hyperlipidemia   Noncompliance, no meds for two weeks prior to admission   Tobacco use   Gout flare. 09/29/12, Lt knee, improved  with colchicine.    PLAN: Surgery next week. Stop Keflex.   Corine Shelter PA-C Beeper [phone] 10/04/2012, 9:27 AM   Patient seen and examined. Agree with assessment and plan. Continues to diurese. I/O now -[id number] since admission. Cellulitis appears to have resolve after 3 weeks of therapy. Will dc keflex. CABG/AVR on Tuesday with Dr. Tyrone Sage.   Lennette Bihari, MD, [facility] 10/04/2012 12:22 PM

## 2012-10-04 NOTE — Progress Notes (Signed)
Physical Therapy Treatment Patient Details Name: [name] MRN: [id number] DOB: 1950/07/04 Today's Date: 10/04/2012 Time: 1191-4782 PT Time Calculation (min): 15 min  PT Assessment / Plan / Recommendation Comments on Treatment Session  Pt moving well.  Awaiting surgery on Tuesday.  PT will cont to follow pt acutely to progress activity tolerance & independence with mobility.      Follow Up Recommendations  Home health PT;Supervision/Assistance - 24 hour     Does the patient have the potential to tolerate intense rehabilitation     Barriers to Discharge        Equipment Recommendations  Rolling walker with 5" wheels    Recommendations for Other Services    Frequency Min 3X/week   Plan Discharge plan remains appropriate    Precautions / Restrictions Precautions Precautions: None Restrictions Weight Bearing Restrictions: No       Mobility  Bed Mobility Bed Mobility: Supine to Sit;Sitting - Scoot to Edge of Bed Supine to Sit: 6: Modified independent (Device/Increase time) Sitting - Scoot to Edge of Bed: 6: Modified independent (Device/Increase time) Transfers Transfers: Sit to Stand;Stand to Sit Sit to Stand: 6: Modified independent (Device/Increase time);With upper extremity assist;From bed Stand to Sit: 6: Modified independent (Device/Increase time);With upper extremity assist;With armrests;To chair/3-in-1 Details for Transfer Assistance: Performed 4x's for strengthening & activity tolerance.  Ambulation/Gait Ambulation/Gait Assistance: 5: Supervision Ambulation Distance (Feet): 240 Feet Assistive device: Rolling walker Ambulation/Gait Assistance Details: Pt able to complete entire distance without any rest breaks however he did state pain in LLE beginning towards end of distance & requesting to get back to room.   Encouragement to decrease reliance of UE's on RW but pt states he is nervous to do so due to knee pain.   Gait Pattern: Step-through  pattern;Antalgic;Trunk flexed General Gait Details: Patient steady with ambulation today Stairs: No    Exercises General Exercises - Lower Extremity Ankle Circles/Pumps: AROM;Both;10 reps Long Arc Quad: AROM;Both;10 reps Hip ABduction/ADduction: AROM;Strengthening;Both;10 reps Hip Flexion/Marching: AROM;Strengthening;Both;10 reps Toe Raises: AROM;Both;10 reps Heel Raises: AROM;Both;10 reps     PT Goals Acute Rehab PT Goals Time For Goal Achievement: 10/07/12 Potential to Achieve Goals: Good Pt will go Supine/Side to Sit: Independently PT Goal: Supine/Side to Sit - Progress: Progressing toward goal Pt will go Sit to Stand: Independently PT Goal: Sit to Stand - Progress: Progressing toward goal Pt will go Stand to Sit: Independently PT Goal: Stand to Sit - Progress: Progressing toward goal Pt will Stand: Independently Pt will Ambulate: >150 feet;with least restrictive assistive device;with modified independence PT Goal: Ambulate - Progress: Progressing toward goal Pt will Go Up / Down Stairs: 1-2 stairs;with modified independence;with least restrictive assistive device  Visit Information  Last PT Received On: 10/04/12 Assistance Needed: +1    Subjective Data      Cognition  Cognition Arousal/Alertness: Awake/alert Behavior During Therapy: WFL for tasks assessed/performed Overall Cognitive Status: Within Functional Limits for tasks assessed    Balance     End of Session PT - End of Session Equipment Utilized During Treatment: Gait belt Activity Tolerance: Patient tolerated treatment well Patient left: in chair Nurse Communication: Mobility status     Verdell Face, [place] [phone] 10/04/2012

## 2012-10-04 NOTE — Clinical Social Work Psychosocial (Signed)
     Clinical Social Work Department BRIEF PSYCHOSOCIAL ASSESSMENT 10/04/2012  Patient:  [name], [name]     Account Number:  [id number]     Admit date:  09/16/2012  Clinical Social Worker:  Hulan Fray  Date/Time:  10/04/2012 02:16 PM  Referred by:  Care Management  Date Referred:  10/04/2012 Referred for  SNF Placement   Other Referral:   Interview type:  Patient Other interview type:    PSYCHOSOCIAL DATA Living Status:  ALONE Admitted from facility:   Level of care:   Primary support name:  Raiford Simmonds Primary support relationship to patient:  CHILD, ADULT Degree of support available:   supportive    CURRENT CONCERNS Current Concerns  Post-Acute Placement   Other Concerns:    SOCIAL WORK ASSESSMENT / PLAN Clinical Social Worker received referral for possible SNF placement. CSW introduced self and explained reason for visit. CSW provided SNF packet.     Patient reported that his neice is speaking with the finacial counselor and patient does not qualify for Medicaid because he is over the financial limit. CSW explained that without insurance, SNF placement will be private pay. Patient is agreeable for CSW to intiate a SNF search in [place]. with the understanding that it could be private pay.   Assessment/plan status:  Psychosocial Support/Ongoing Assessment of Needs Other assessment/ plan:   Information/referral to community resources:   SNF packet    PATIENTS/FAMILYS RESPONSE TO PLAN OF CARE: Patient is agreeable for CSW to initate SNF search in [facility] with the understanding that it could be private pay. Patient was appreciative of CSW's visit and assistance.

## 2012-10-04 NOTE — Clinical Social Work Placement (Addendum)
    Clinical Social Work Department CLINICAL SOCIAL WORK PLACEMENT NOTE 10/21/2012  Patient:  [name], [name]  Account Number:  [id number] Admit date:  09/16/2012  Clinical Social Worker:  Rozetta Nunnery, Theresia Majors  Date/time:  10/04/2012 03:54 PM  Clinical Social Work is seeking post-discharge placement for this patient at the following level of care:   SKILLED NURSING   (*CSW will update this form in Epic as items are completed)   10/04/2012  Patient/family provided with [facility] Department of Clinical Social Work's list of facilities offering this level of care within the geographic area requested by the patient (or if unable, by the patient's family).  10/04/2012  Patient/family informed of their freedom to choose among providers that offer the needed level of care, that participate in Medicare, Medicaid or managed care program needed by the patient, have an available bed and are willing to accept the patient.  10/04/2012  Patient/family informed of [facility]' ownership interest in [facility], as well as of the fact that they are under no obligation to receive care at this facility.  PASARR submitted to EDS on 10/04/2012 PASARR number received from EDS on   FL2 transmitted to all facilities in geographic area requested by pt/family on  10/04/2012 FL2 transmitted to all facilities within larger geographic area on   Patient informed that his/her managed care company has contracts with or will negotiate with  certain facilities, including the following:     Patient/family informed of bed offers received:  10/18/2012 Patient chooses bed at [facility] Physician recommends and patient chooses bed at    Patient to be transferred to [facility] on  10/18/2012 Patient to be transferred to facility by ambulance Sharin Mons)  The following physician request were entered in Epic:   Additional Comments: 10/18/12   Ok per MD for d/c today to SNF. Patient and family are  pleased with d/c plan. Notified SNF and pt's nurse to call report. No further CSW needs identified. CSW signing off.  Lorri Frederick. Hareem Surowiec, LCSWA  [phone]

## 2012-10-04 NOTE — Progress Notes (Signed)
ANTICOAGULATION CONSULT NOTE - Follow Up Consult  Pharmacy Consult for Heparin Indication: atrial fibrillation  No Known Allergies  Patient Measurements: Height: 5\' 10"  (177.8 cm) Weight: 189 lb 6 oz (85.9 kg) IBW/kg (Calculated) : 73 Heparin Dosing Weight: 78 kg  Vital Signs: Temp: 98.1 F (36.7 C) (06/13 0547) Temp src: Oral (06/13 0547) BP: 97/55 mmHg (06/13 0547) Pulse Rate: 97 (06/13 1012)  Labs:  Recent Labs  10/02/12 0350 10/03/12 0500 10/04/12 0430  HGB 13.5 13.2 13.5  HCT 42.1 41.0 40.9  PLT 234 272 262  HEPARINUNFRC 0.31 0.73* 0.48  CREATININE 1.18 1.14 1.06    Estimated Creatinine Clearance: 73.7 ml/min (by C-G formula based on Cr of 1.06).  Assessment:  Heparin level is therapeutic today on 1550 units/hr. CBC stable.  For OHS on 10/08/12.  Goal of Therapy:  Heparin level 0.3-0.7 units/ml Monitor platelets by anticoagulation protocol: Yes   Plan:   Continue heparin drip at 1550 units/hr.  Daily heparin level and CBC while on heparin.  Dennie Fetters, RPh Pager: [phone] 10/04/2012,10:23 AM

## 2012-10-04 NOTE — Progress Notes (Signed)
CARDIAC REHAB PHASE I   PRE:  Rate/Rhythm: 103 afib  BP:  Supine:   Sitting: 90/60  Standing:    SaO2: 94%RA  MODE:  Ambulation: 400 ft   POST:  Rate/Rhythm: 125 afib  BP:  Supine:   Sitting: 106/40  Standing:    SaO2: 96%RA 1500-1522 Pt walked 400 ft on RA with rolling walker and asst x 1. Gait steady. Tolerated well. Reminded pt of sternal precautions and to not use arms as much.   Luetta Nutting, RN BSN  10/04/2012 3:20 PM

## 2012-10-05 LAB — BASIC METABOLIC PANEL
BUN: 22 mg/dL (ref 6–23)
Creatinine, Ser: 1.17 mg/dL (ref 0.50–1.35)
GFR calc Af Amer: 75 mL/min — ABNORMAL LOW (ref >90)
GFR calc non Af Amer: 65 mL/min — ABNORMAL LOW (ref >90)
Glucose, Bld: 91 mg/dL (ref 70–99)

## 2012-10-05 LAB — CBC
HCT: 41.7 % (ref 39.0–52.0)
Hemoglobin: 13.8 g/dL (ref 13.0–17.0)
MCH: 29.9 pg (ref 26.0–34.0)
MCHC: 33.1 g/dL (ref 30.0–36.0)
MCV: 90.3 fL (ref 78.0–100.0)
RDW: 15 % (ref 11.5–15.5)

## 2012-10-05 LAB — HEPARIN LEVEL (UNFRACTIONATED): Heparin Unfractionated: 0.72 IU/mL — ABNORMAL HIGH (ref 0.30–0.70)

## 2012-10-05 NOTE — Progress Notes (Signed)
Spoke with patient today to provide SNF bed offers rec'd thus far-  Discussed with patient and friend at bedside- currently patient does not have coverage for SNF payment- friend reports he is being considered for a [facility] program via the financial department- CSW provided patient with the current bed offers and the private pay rates- CSW to f/u Monday with further SNF offers and further investigation into the assistance program mentioned-  Patient in good spirits- he reports plans for CABG on Tuesday of next week- stating, "this could have been a lot worse'. Support and encouragement provided.  Reece Levy, MSW, LCSWA [phone]/weekend coverage

## 2012-10-05 NOTE — Progress Notes (Addendum)
CARDIAC REHAB PHASE I   PRE:  Rate/Rhythm: 103 afib  BP:  Supine:   Sitting: 98/60  Standing:    SaO2: 93% ra  MODE:  Ambulation: 350 ft   POST:  Rate/Rhythem: 106 afib  BP:  Supine:   Sitting: 100/58  Standing:    SaO2: 96% ra  1120-1135  Pt ambulated in hallway without difficulty.  Slow steady gait, asymptomatic.   Pt reminded to use arms less with rolling walker and when rising from seated position.  Elgar Scoggins, [place]

## 2012-10-05 NOTE — Progress Notes (Signed)
ANTICOAGULATION CONSULT NOTE - Follow Up Consult  Pharmacy Consult for Heparin Indication: atrial fibrillation  No Known Allergies  Patient Measurements: Height: 5\' 10"  (177.8 cm) Weight: 189 lb 8 oz (85.957 kg) IBW/kg (Calculated) : 73 Heparin Dosing Weight: 78 kg  Vital Signs: Temp: 97.6 F (36.4 C) (06/14 0500) Temp src: Oral (06/14 0500) BP: 103/64 mmHg (06/14 0500) Pulse Rate: 109 (06/14 0620)  Labs:  Recent Labs  10/03/12 0500 10/04/12 0430 10/05/12 0445  HGB 13.2 13.5 13.8  HCT 41.0 40.9 41.7  PLT 272 262 282  HEPARINUNFRC 0.73* 0.48 0.72*  CREATININE 1.14 1.06 1.17    Estimated Creatinine Clearance: 66.7 ml/min (by C-G formula based on Cr of 1.17).  Assessment: 62 yo M continues on heparin infusion for afib.  Pt is awaiting CABG, AVR, and MAZE procedure which is tentatively planned for 10/08/12.  Heparin level is now just above goal  On 1550 units/hr.  Will reduce rate to 1500 units/hr.  CBC stable.  No bleeding noted.  Goal of Therapy:  Heparin level 0.3-0.7 units/ml Monitor platelets by anticoagulation protocol: Yes   Plan:  Reduce heparin to 1500 units/hr to maintain goal. Continue daily heparin level and CBC.  [employer], Pharm.D., BCPS Clinical Pharmacist Pager [phone] 10/05/2012 10:47 AM

## 2012-10-05 NOTE — Progress Notes (Signed)
Subjective:  No chest pain. Up walking.  Objective:  Vital Signs in the last 24 hours: Temp:  [97.5 F (36.4 C)-97.6 F (36.4 C)] 97.6 F (36.4 C) (06/14 0500) Pulse Rate:  [89-109] 109 (06/14 0620) Resp:  [16-18] 18 (06/14 0500) BP: (96-108)/(51-69) 103/64 mmHg (06/14 0500) SpO2:  [94 %] 94 % (06/14 0500) Weight:  [85.957 kg (189 lb 8 oz)] 85.957 kg (189 lb 8 oz) (06/14 0500)  Intake/Output from previous day:  Intake/Output Summary (Last 24 hours) at 10/05/12 1017 Last data filed at 10/05/12 0500  Gross per 24 hour  Intake    480 ml  Output   1100 ml  Net   -620 ml    Physical Exam: General appearance: alert, cooperative and no distress Lungs: decreased breath sounds Heart: irregularly irregular rhythm   Rate: 80  Rhythm: atrial fibrillation  Lab Results:  Recent Labs  10/04/12 0430 10/05/12 0445  WBC 4.7 5.7  HGB 13.5 13.8  PLT 262 282    Recent Labs  10/04/12 0430 10/05/12 0445  NA 136 136  K 3.9 4.4  CL 100 100  CO2 27 26  GLUCOSE 93 91  BUN 21 22  CREATININE 1.06 1.17   No results found for this basename: TROPONINI, CK, MB,  in the last 72 hours Hepatic Function Panel No results found for this basename: PROT, ALBUMIN, AST, ALT, ALKPHOS, BILITOT, BILIDIR, IBILI,  in the last 72 hours No results found for this basename: CHOL,  in the last 72 hours No results found for this basename: INR,  in the last 72 hours  Imaging: Imaging results have been reviewed  Cardiac Studies:  Assessment/Plan:   Principal Problem:   Acute respiratory failure, with BiPAP needed on admit Active Problems:   PAF (paroxysmal atrial fibrillation), he has had periods of NSR since admission   Acute combined systolic and diastolic CHF, NYHA class 3 -- previous normal LV function and moderate AS- re-evaluate; EF now 30-35% with regional WMA   Aortic stenosis, severe   CAD, RCA/PDA, 30-40% LM at cath 09/20/12   Bilateral lower extremity edema, secondary to acute CHF  Cellulitis, lower extremity- treated   PAD (peripheral artery disease), decreased bil. ABIs   Chronic obstructive pulmonary disease   Cardiomyopathy, ischemic- 30-35% 2D    HTN (hypertension)   Hyperlipidemia   Noncompliance, no meds for two weeks prior to admission   Tobacco use   Gout flare. 09/29/12, Lt knee, improved with colchicine.    PLAN: CABG, AVR Tuesday  Corine Shelter PA-C Beeper [phone] 10/05/2012, 10:17 AM   I have seen and examined the patient along with Corine Shelter PA-C.  I have reviewed the chart, notes and new data.  I agree with PA's note.  Key new complaints: he is walking 4-5 times a day which bodes well for postop recovery Key examination changes: 2+ residual calf edema, much improved Key new findings / data: continues to have daily negative fluid balance, stable renal function  PLAN: Surgery scheduled for Tuesday  Thurmon Fair, MD, [facility] [phone] 10/05/2012, 10:20 AM

## 2012-10-06 LAB — BASIC METABOLIC PANEL
CO2: 28 mEq/L (ref 19–32)
Calcium: 9 mg/dL (ref 8.4–10.5)
Creatinine, Ser: 1.17 mg/dL (ref 0.50–1.35)
GFR calc non Af Amer: 65 mL/min — ABNORMAL LOW (ref >90)
Glucose, Bld: 105 mg/dL — ABNORMAL HIGH (ref 70–99)
Sodium: 136 mEq/L (ref 135–145)

## 2012-10-06 LAB — CBC
MCH: 29.3 pg (ref 26.0–34.0)
MCHC: 32.5 g/dL (ref 30.0–36.0)
MCV: 90.3 fL (ref 78.0–100.0)
Platelets: 271 10*3/uL (ref 150–400)
RBC: 4.64 MIL/uL (ref 4.22–5.81)

## 2012-10-06 NOTE — Progress Notes (Signed)
[facility]  DAILY PROGRESS NOTE   Subjective:  Feels well, getting a little more anxious as day of surgery approaces. No dyspnea. Edema continues to recede.  Objective:  Temp:  [98 F (36.7 C)-98.1 F (36.7 C)] 98.1 F (36.7 C) (06/15 0417) Pulse Rate:  [72-100] 100 (06/15 0417) Resp:  [18-19] 18 (06/15 0417) BP: (100-115)/(52-70) 108/65 mmHg (06/15 0417) SpO2:  [95 %-100 %] 95 % (06/15 0417) Weight:  [183 lb 4.8 oz (83.144 kg)] 183 lb 4.8 oz (83.144 kg) (06/15 0500) Weight change: -6 lb 3.2 oz (-2.812 kg)  Intake/Output from previous day: 06/14 0701 - 06/15 0700 In: -  Out: 1050 [Urine:1050]  Intake/Output from this shift: Total I/O In: 360 [P.O.:360] Out: -   Medications: Current Facility-Administered Medications  Medication Dose Route Frequency Provider Last Rate Last Dose  . 0.9 %  sodium chloride infusion   Intravenous Continuous PRN Lennette Bihari, MD      . 0.9 %  sodium chloride infusion   Intravenous Continuous Delight Ovens, MD 10 mL/hr at 10/03/12 1954 10 mL/hr at 10/03/12 1954  . acetaminophen (TYLENOL) tablet 650 mg  650 mg Oral Q4H PRN Runell Gess, MD   650 mg at 09/28/12 2203  . antiseptic oral rinse (BIOTENE) solution 15 mL  15 mL Mouth Rinse BID Lennette Bihari, MD   15 mL at 10/05/12 2000  . aspirin EC tablet 81 mg  81 mg Oral Daily Nada Boozer, NP   81 mg at 10/05/12 1002  . atorvastatin (LIPITOR) tablet 40 mg  40 mg Oral q1800 Nada Boozer, NP   40 mg at 10/05/12 1745  . budesonide (PULMICORT) nebulizer solution 0.5 mg  0.5 mg Nebulization BID Jeanella Craze, NP   0.5 mg at 10/06/12 0743  . diclofenac sodium (VOLTAREN) 1 % transdermal gel 4 g  4 g Topical QID Abelino Derrick, PA-C   4 g at 10/05/12 2154  . digoxin (LANOXIN) tablet 0.25 mg  0.25 mg Oral Daily Abelino Derrick, PA-C   0.25 mg at 10/05/12 1003  . furosemide (LASIX) injection 60 mg  60 mg Intravenous BID Runell Gess, MD   60 mg at 10/05/12 1742  .  heparin ADULT infusion 100 units/mL (25000 units/250 mL)  1,500 Units/hr Intravenous Continuous Judie Bonus Hammons, RPH 15 mL/hr at 10/05/12 1048 1,500 Units/hr at 10/05/12 1048  . ipratropium (ATROVENT) nebulizer solution 0.5 mg  0.5 mg Nebulization BID Lennette Bihari, MD   0.5 mg at 10/06/12 0743  . irbesartan (AVAPRO) tablet 75 mg  75 mg Oral Daily Lennette Bihari, MD   75 mg at 10/05/12 1003  . levalbuterol (XOPENEX) nebulizer solution 0.63 mg  0.63 mg Nebulization Q4H PRN Merwyn Katos, MD      . LORazepam (ATIVAN) tablet 0.5 mg  0.5 mg Oral Q4H PRN Nada Boozer, NP   0.5 mg at 09/26/12 2200  . metoprolol tartrate (LOPRESSOR) tablet 37.5 mg  37.5 mg Oral Q8H Cantrell Larouche, MD   37.5 mg at 10/06/12 0639  . mirtazapine (REMERON) tablet 15 mg  15 mg Oral QHS Lennette Bihari, MD   15 mg at 10/05/12 2153  . morphine 2 MG/ML injection 2 mg  2 mg Intravenous Q1H PRN Runell Gess, MD   2 mg at 09/29/12 1818  . nitroGLYCERIN (NITROSTAT) SL tablet 0.4 mg  0.4 mg Sublingual Q5 Min x 3 PRN Nada Boozer, NP      .  ondansetron (ZOFRAN) injection 4 mg  4 mg Intravenous Q6H PRN Runell Gess, MD      . potassium chloride SA (K-DUR,KLOR-CON) CR tablet 20 mEq  20 mEq Oral Daily Runell Gess, MD   20 mEq at 10/05/12 1002    Physical Exam: General appearance: alert, cooperative and no distress Neck: no adenopathy, no carotid bruit, no JVD, supple, symmetrical, trachea midline and thyroid not enlarged, symmetric, no tenderness/mass/nodules Lungs: clear to auscultation bilaterally Heart: regular rate and rhythm, S1: normal, S2: decreased intensity and systolic murmur: systolic ejection 2/6, crescendo, decrescendo and mid peaking at 2nd right intercostal space Abdomen: soft, non-tender; bowel sounds normal; no masses,  no organomegaly Extremities: edema trace right ankle, 1-2+ left ankle Pulses: 2+ and symmetric Skin: Skin color, texture, turgor normal. No rashes or lesions Neurologic: Grossly  normal  Lab Results: Results for orders placed during the hospital encounter of 09/16/12 (from the past 48 hour(s))  HEPARIN LEVEL (UNFRACTIONATED)     Status: Abnormal   Collection Time    10/05/12  4:45 AM      Result Value Range   Heparin Unfractionated 0.72 (*) 0.30 - 0.70 IU/mL   Comment:            IF HEPARIN RESULTS ARE BELOW     EXPECTED VALUES, AND PATIENT     DOSAGE HAS BEEN CONFIRMED,     SUGGEST FOLLOW UP TESTING     OF ANTITHROMBIN III LEVELS.  CBC     Status: None   Collection Time    10/05/12  4:45 AM      Result Value Range   WBC 5.7  4.0 - 10.5 K/uL   RBC 4.62  4.22 - 5.81 MIL/uL   Hemoglobin 13.8  13.0 - 17.0 g/dL   HCT 40.9  81.1 - 91.4 %   MCV 90.3  78.0 - 100.0 fL   MCH 29.9  26.0 - 34.0 pg   MCHC 33.1  30.0 - 36.0 g/dL   RDW 78.2  95.6 - 21.3 %   Platelets 282  150 - 400 K/uL  BASIC METABOLIC PANEL     Status: Abnormal   Collection Time    10/05/12  4:45 AM      Result Value Range   Sodium 136  135 - 145 mEq/L   Potassium 4.4  3.5 - 5.1 mEq/L   Chloride 100  96 - 112 mEq/L   CO2 26  19 - 32 mEq/L   Glucose, Bld 91  70 - 99 mg/dL   BUN 22  6 - 23 mg/dL   Creatinine, Ser 0.86  0.50 - 1.35 mg/dL   Calcium 9.0  8.4 - 57.8 mg/dL   GFR calc non Af Amer 65 (*) >90 mL/min   GFR calc Af Amer 75 (*) >90 mL/min   Comment:            The eGFR has been calculated     using the CKD EPI equation.     This calculation has not been     validated in all clinical     situations.     eGFR's persistently     <90 mL/min signify     possible Chronic Kidney Disease.  HEPARIN LEVEL (UNFRACTIONATED)     Status: None   Collection Time    10/06/12  5:11 AM      Result Value Range   Heparin Unfractionated 0.58  0.30 - 0.70 IU/mL   Comment:  IF HEPARIN RESULTS ARE BELOW     EXPECTED VALUES, AND PATIENT     DOSAGE HAS BEEN CONFIRMED,     SUGGEST FOLLOW UP TESTING     OF ANTITHROMBIN III LEVELS.  CBC     Status: None   Collection Time    10/06/12  5:11  AM      Result Value Range   WBC 5.4  4.0 - 10.5 K/uL   RBC 4.64  4.22 - 5.81 MIL/uL   Hemoglobin 13.6  13.0 - 17.0 g/dL   HCT 16.1  09.6 - 04.5 %   MCV 90.3  78.0 - 100.0 fL   MCH 29.3  26.0 - 34.0 pg   MCHC 32.5  30.0 - 36.0 g/dL   RDW 40.9  81.1 - 91.4 %   Platelets 271  150 - 400 K/uL  BASIC METABOLIC PANEL     Status: Abnormal   Collection Time    10/06/12  5:11 AM      Result Value Range   Sodium 136  135 - 145 mEq/L   Potassium 4.0  3.5 - 5.1 mEq/L   Chloride 100  96 - 112 mEq/L   CO2 28  19 - 32 mEq/L   Glucose, Bld 105 (*) 70 - 99 mg/dL   BUN 25 (*) 6 - 23 mg/dL   Creatinine, Ser 7.82  0.50 - 1.35 mg/dL   Calcium 9.0  8.4 - 95.6 mg/dL   GFR calc non Af Amer 65 (*) >90 mL/min   GFR calc Af Amer 75 (*) >90 mL/min   Comment:            The eGFR has been calculated     using the CKD EPI equation.     This calculation has not been     validated in all clinical     situations.     eGFR's persistently     <90 mL/min signify     possible Chronic Kidney Disease.    Imaging: Imaging results have been reviewed and No results found.  Assessment:  1. Principal Problem: 2.   Acute respiratory failure, with BiPAP needed on admit 3. Active Problems: 4.   PAF (paroxysmal atrial fibrillation), he has had periods of NSR since admission 5.   HTN (hypertension) 6.   Hyperlipidemia 7.   Noncompliance, no meds for two weeks prior to admission 8.   Tobacco use 9.   Acute combined systolic and diastolic CHF, NYHA class 3 -- previous normal LV function and moderate AS- re-evaluate; EF now 30-35% with regional WMA 10.   Bilateral lower extremity edema, secondary to acute CHF 11.   Aortic stenosis, severe 12.   CAD, RCA/PDA, 30-40% LM at cath 09/20/12 13.   Cellulitis, lower extremity- treated 14.   PAD (peripheral artery disease), decreased bil. ABIs 15.   Chronic obstructive pulmonary disease 16.   Cardiomyopathy, ischemic- 30-35% 2D  17.   Gout flare. 09/29/12, Lt knee, improved  with colchicine. 18.   Plan:  1. Diuresis is slowing down,  As appropriate to help preserve renal function at time of surgery. Will hold diuretics altogether after today.  Time Spent Directly with Patient:  30 minutes  Length of Stay:  LOS: 20 days    Raine Blodgett 10/06/2012, 9:33 AM

## 2012-10-06 NOTE — Progress Notes (Signed)
ANTICOAGULATION CONSULT NOTE - Follow Up Consult  Pharmacy Consult for Heparin Indication: atrial fibrillation  No Known Allergies  Patient Measurements: Height: 5\' 10"  (177.8 cm) Weight: 183 lb 4.8 oz (83.144 kg) IBW/kg (Calculated) : 73 Heparin Dosing Weight: 78 kg  Vital Signs: Temp: 98.1 F (36.7 C) (06/15 0417) Temp src: Oral (06/15 0417) BP: 108/65 mmHg (06/15 0417) Pulse Rate: 100 (06/15 0417)  Labs:  Recent Labs  10/04/12 0430 10/05/12 0445 10/06/12 0511  HGB 13.5 13.8 13.6  HCT 40.9 41.7 41.9  PLT 262 282 271  HEPARINUNFRC 0.48 0.72* 0.58  CREATININE 1.06 1.17 1.17    Estimated Creatinine Clearance: 66.7 ml/min (by C-G formula based on Cr of 1.17).  Assessment: 62 yo M continues on heparin infusion for afib.  Pt is awaiting CABG, AVR, and MAZE procedure which is tentatively planned for 10/08/12.  Heparin level is therapeutic on 1500 units/hr.  CBC stable.  No bleeding noted.  Goal of Therapy:  Heparin level 0.3-0.7 units/ml Monitor platelets by anticoagulation protocol: Yes   Plan:  Continue heparin to 1500 units/hr.  Continue daily heparin level and CBC.  [employer], Pharm.D., BCPS Clinical Pharmacist Pager [phone] 10/06/2012 11:20 AM

## 2012-10-07 ENCOUNTER — Inpatient Hospital Stay (HOSPITAL_COMMUNITY): Payer: Medicaid Other

## 2012-10-07 ENCOUNTER — Encounter (HOSPITAL_COMMUNITY): Payer: Self-pay | Admitting: Anesthesiology

## 2012-10-07 DIAGNOSIS — I2510 Atherosclerotic heart disease of native coronary artery without angina pectoris: Secondary | ICD-10-CM

## 2012-10-07 DIAGNOSIS — I359 Nonrheumatic aortic valve disorder, unspecified: Secondary | ICD-10-CM

## 2012-10-07 LAB — CBC
HCT: 44.8 % (ref 39.0–52.0)
MCHC: 33.3 g/dL (ref 30.0–36.0)
MCV: 90.5 fL (ref 78.0–100.0)
Platelets: 303 10*3/uL (ref 150–400)
RDW: 15 % (ref 11.5–15.5)
WBC: 6.7 10*3/uL (ref 4.0–10.5)

## 2012-10-07 LAB — BASIC METABOLIC PANEL
BUN: 23 mg/dL (ref 6–23)
Creatinine, Ser: 1.09 mg/dL (ref 0.50–1.35)
GFR calc Af Amer: 82 mL/min — ABNORMAL LOW (ref >90)
GFR calc non Af Amer: 70 mL/min — ABNORMAL LOW (ref >90)

## 2012-10-07 LAB — APTT: aPTT: 112 seconds — ABNORMAL HIGH (ref 24–37)

## 2012-10-07 LAB — HEPARIN LEVEL (UNFRACTIONATED): Heparin Unfractionated: 0.5 IU/mL (ref 0.30–0.70)

## 2012-10-07 LAB — TYPE AND SCREEN
ABO/RH(D): O POS
Antibody Screen: NEGATIVE

## 2012-10-07 LAB — PROTIME-INR
INR: 0.98 (ref 0.00–1.49)
Prothrombin Time: 12.9 seconds (ref 11.6–15.2)

## 2012-10-07 LAB — SURGICAL PCR SCREEN
MRSA, PCR: NEGATIVE
Staphylococcus aureus: NEGATIVE

## 2012-10-07 MED ORDER — MAGNESIUM SULFATE 50 % IJ SOLN
40.0000 meq | INTRAMUSCULAR | Status: DC
  Filled 2012-10-07: qty 10

## 2012-10-07 MED ORDER — EPINEPHRINE HCL 1 MG/ML IJ SOLN
0.5000 ug/min | INTRAVENOUS | Status: DC
  Filled 2012-10-07: qty 4

## 2012-10-07 MED ORDER — POTASSIUM CHLORIDE 2 MEQ/ML IV SOLN
80.0000 meq | INTRAVENOUS | Status: DC
  Filled 2012-10-07: qty 40

## 2012-10-07 MED ORDER — NITROGLYCERIN IN D5W 200-5 MCG/ML-% IV SOLN
2.0000 ug/min | INTRAVENOUS | Status: DC
  Filled 2012-10-07: qty 250

## 2012-10-07 MED ORDER — ALPRAZOLAM 0.25 MG PO TABS: 0.2500 mg | ORAL_TABLET | ORAL | Status: DC | PRN

## 2012-10-07 MED ORDER — TEMAZEPAM 15 MG PO CAPS: 15.0000 mg | ORAL_CAPSULE | Freq: Once | ORAL | Status: AC | PRN

## 2012-10-07 MED ORDER — CHLORHEXIDINE GLUCONATE 4 % EX LIQD
60.0000 mL | Freq: Once | CUTANEOUS | Status: AC
  Administered 2012-10-08: 4 via TOPICAL
  Filled 2012-10-07: qty 60

## 2012-10-07 MED ORDER — DEXTROSE 5 % IV SOLN
30.0000 ug/min | INTRAVENOUS | Status: DC
  Filled 2012-10-07: qty 2

## 2012-10-07 MED ORDER — PLASMA-LYTE 148 IV SOLN
INTRAVENOUS | Status: AC
  Administered 2012-10-08: 09:00:00
  Filled 2012-10-07: qty 2.5

## 2012-10-07 MED ORDER — SODIUM CHLORIDE 0.9 % IV SOLN
INTRAVENOUS | Status: DC
  Filled 2012-10-07: qty 1

## 2012-10-07 MED ORDER — DEXMEDETOMIDINE HCL IN NACL 400 MCG/100ML IV SOLN
0.1000 ug/kg/h | INTRAVENOUS | Status: AC
  Administered 2012-10-08: 0.3 ug/kg/h via INTRAVENOUS
  Filled 2012-10-07: qty 100

## 2012-10-07 MED ORDER — CHLORHEXIDINE GLUCONATE 4 % EX LIQD
60.0000 mL | Freq: Once | CUTANEOUS | Status: AC
  Administered 2012-10-07: 4 via TOPICAL
  Filled 2012-10-07: qty 60

## 2012-10-07 MED ORDER — DEXTROSE 5 % IV SOLN
750.0000 mg | INTRAVENOUS | Status: DC
  Filled 2012-10-07: qty 750

## 2012-10-07 MED ORDER — SODIUM CHLORIDE 0.9 % IV SOLN
INTRAVENOUS | Status: DC
  Filled 2012-10-07: qty 40

## 2012-10-07 MED ORDER — DOPAMINE-DEXTROSE 3.2-5 MG/ML-% IV SOLN
2.0000 ug/kg/min | INTRAVENOUS | Status: AC
  Administered 2012-10-08: 3 ug/kg/min via INTRAVENOUS
  Filled 2012-10-07: qty 250

## 2012-10-07 MED ORDER — BISACODYL 5 MG PO TBEC
5.0000 mg | DELAYED_RELEASE_TABLET | Freq: Once | ORAL | Status: AC
  Administered 2012-10-07: 5 mg via ORAL
  Filled 2012-10-07: qty 1

## 2012-10-07 MED ORDER — METOPROLOL TARTRATE 12.5 MG HALF TABLET
12.5000 mg | ORAL_TABLET | Freq: Once | ORAL | Status: AC
  Administered 2012-10-08: 12.5 mg via ORAL
  Filled 2012-10-07: qty 1

## 2012-10-07 MED ORDER — SODIUM CHLORIDE 0.9 % IV SOLN
INTRAVENOUS | Status: DC
  Filled 2012-10-07: qty 30

## 2012-10-07 MED ORDER — DEXTROSE 5 % IV SOLN
1.5000 g | INTRAVENOUS | Status: AC
  Administered 2012-10-08: .75 g via INTRAVENOUS
  Administered 2012-10-08: 1.5 g via INTRAVENOUS
  Filled 2012-10-07: qty 1.5

## 2012-10-07 MED ORDER — VANCOMYCIN HCL 10 G IV SOLR
1250.0000 mg | INTRAVENOUS | Status: AC
  Administered 2012-10-08: 1250 mg via INTRAVENOUS
  Filled 2012-10-07: qty 1250

## 2012-10-07 NOTE — Progress Notes (Signed)
[address]       [place] [id number]             [phone]                 17 Days Post-Op Procedure(s) (LRB): LEFT AND RIGHT HEART CATHETERIZATION WITH CORONARY ANGIOGRAM (N/A)  LOS: 21 days   Subjective: Feels better, walking in hall, edema much improved now just at ankles  Objective: Vital signs in last 24 hours: Patient Vitals for the past 24 hrs:  BP Temp Temp src Pulse Resp SpO2 Weight  10/07/12 1440 95/63 mmHg 98.1 F (36.7 C) Oral 101 18 - -  10/07/12 1118 92/54 mmHg - - 96 - - -  10/07/12 0832 - - - - - 95 % -  10/07/12 0814 - - - 122 - - -  10/07/12 0516 121/83 mmHg 98.3 F (36.8 C) Oral 102 16 96 % -  10/07/12 0437 101/75 mmHg - - 134 - 94 % -  10/06/12 2120 95/66 mmHg 97.9 F (36.6 C) Axillary 75 16 99 % -  10/06/12 1730 106/58 mmHg - - 117 - - -    Filed Weights   10/04/12 0547 10/05/12 0500 10/06/12 0500  Weight: 189 lb 6 oz (85.9 kg) 189 lb 8 oz (85.957 kg) 183 lb 4.8 oz (83.144 kg)    Hemodynamic parameters for last 24 hours:    Intake/Output from previous day: 06/15 0701 - 06/16 0700 In: 360 [P.O.:360] Out: 825 [Urine:825] Intake/Output this shift: Total I/O In: 240 [P.O.:240] Out: -   Scheduled Meds: . [START ON 10/08/2012] aminocaproic acid (AMICAR) for OHS   Intravenous To OR  . antiseptic oral rinse  15 mL Mouth Rinse BID  . aspirin EC  81 mg Oral Daily  . atorvastatin  40 mg Oral q1800  . budesonide (PULMICORT) nebulizer solution  0.5 mg Nebulization BID  . [START ON 10/08/2012] cefUROXime (ZINACEF)  IV  1.5 g Intravenous To OR  . [START ON 10/08/2012] cefUROXime (ZINACEF)  IV  750 mg Intravenous To OR  . [START ON 10/08/2012] dexmedetomidine  0.1-0.7 mcg/kg/hr Intravenous To OR  . digoxin  0.25 mg Oral Daily  . [START ON 10/08/2012] DOPamine  2-20 mcg/kg/min Intravenous To OR  . [START ON 10/08/2012] epinephrine  0.5-20 mcg/min Intravenous To OR  . [START ON 10/08/2012] heparin-papaverine-plasmalyte irrigation    Irrigation To OR  . [START ON 10/08/2012] heparin 30,000 units/NS 1000 mL solution for CELLSAVER   Other To OR  . [START ON 10/08/2012] insulin (NOVOLIN-R) infusion   Intravenous To OR  . ipratropium  0.5 mg Nebulization BID  . irbesartan  75 mg Oral Daily  . [START ON 10/08/2012] magnesium sulfate  40 mEq Other To OR  . metoprolol tartrate  37.5 mg Oral Q8H  . mirtazapine  15 mg Oral QHS  . [START ON 10/08/2012] nitroGLYCERIN  2-200 mcg/min Intravenous To OR  . [START ON 10/08/2012] phenylephrine (NEO-SYNEPHRINE) Adult infusion  30-200 mcg/min Intravenous To OR  . [START ON 10/08/2012] potassium chloride  80 mEq Other To OR  . potassium chloride  20 mEq Oral Daily  . [START ON 10/08/2012] vancomycin  1,250 mg Intravenous To OR   Continuous Infusions: . sodium chloride    . sodium chloride 10 mL/hr (10/03/12 1954)  . heparin 1,500 Units/hr (10/06/12 1117)   PRN Meds:.sodium chloride, acetaminophen, ALPRAZolam, levalbuterol, LORazepam, morphine injection, nitroGLYCERIN, ondansetron (ZOFRAN) IV  General appearance: alert, cooperative and appears  older than stated age Neurologic: intact Heart: irregularly irregular rhythm Lungs: diminished breath sounds bibasilar Abdomen: soft, non-tender; bowel sounds normal; no masses,  no organomegaly Extremities: edema at ankles  Lab Results: CBC: Recent Labs  10/06/12 0511 10/07/12 0503  WBC 5.4 6.7  HGB 13.6 14.9  HCT 41.9 44.8  PLT 271 303   BMET:  Recent Labs  10/06/12 0511 10/07/12 0503  NA 136 136  K 4.0 4.4  CL 100 99  CO2 28 29  GLUCOSE 105* 122*  BUN 25* 23  CREATININE 1.17 1.09  CALCIUM 9.0 9.2    PT/INR: No results found for this basename: LABPROT, INR,  in the last 72 hours   Radiology No results found.   Assessment/Plan: S/P Procedure(s) (LRB): LEFT AND RIGHT HEART CATHETERIZATION WITH CORONARY ANGIOGRAM (N/A) Plan to proceed with surgery as outlined with patient previously. The goals risks and alternatives of  the planned surgical procedure AVR, CABG, poss MAZE  have been discussed with the patient in detail. The risks of the procedure including death, infection, stroke, myocardial infarction, heart block and need for pacer, bleeding, blood transfusion have all been discussed specifically. Use of a tissue valve with the patients numerous medical problems has been discussed with the patient. The pros and cons of mechanical vs tissue valves have been discussed. Patient prefers tissue valve.  I have quoted [name] a 8% of perioperative mortality and a complication rate as high as 35 %. The patient's questions have been answered.[name] is willing  to proceed with the planned procedure.   Delight Ovens MD 10/07/2012 5:17 PM

## 2012-10-07 NOTE — Progress Notes (Signed)
Subjective:  No CP/SOB  Objective:  Temp:  [97.2 F (36.2 C)-98.3 F (36.8 C)] 98.3 F (36.8 C) (06/16 0516) Pulse Rate:  [67-134] 122 (06/16 0814) Resp:  [16-18] 16 (06/16 0516) BP: (95-121)/(58-83) 121/83 mmHg (06/16 0516) SpO2:  [93 %-99 %] 95 % (06/16 0832) Weight change:   Intake/Output from previous day: 06/15 0701 - 06/16 0700 In: 360 [P.O.:360] Out: 825 [Urine:825]  Intake/Output from this shift: Total I/O In: 120 [P.O.:120] Out: -   Physical Exam: General appearance: alert and no distress Neck: no adenopathy, no carotid bruit, no JVD, supple, symmetrical, trachea midline and thyroid not enlarged, symmetric, no tenderness/mass/nodules Lungs: clear to auscultation bilaterally Heart: 2/6 outflow murmur Extremities: 2+ BLE edema but improved  Lab Results: Results for orders placed during the hospital encounter of 09/16/12 (from the past 48 hour(s))  HEPARIN LEVEL (UNFRACTIONATED)     Status: None   Collection Time    10/06/12  5:11 AM      Result Value Range   Heparin Unfractionated 0.58  0.30 - 0.70 IU/mL   Comment:            IF HEPARIN RESULTS ARE BELOW     EXPECTED VALUES, AND PATIENT     DOSAGE HAS BEEN CONFIRMED,     SUGGEST FOLLOW UP TESTING     OF ANTITHROMBIN III LEVELS.  CBC     Status: None   Collection Time    10/06/12  5:11 AM      Result Value Range   WBC 5.4  4.0 - 10.5 K/uL   RBC 4.64  4.22 - 5.81 MIL/uL   Hemoglobin 13.6  13.0 - 17.0 g/dL   HCT 16.1  09.6 - 04.5 %   MCV 90.3  78.0 - 100.0 fL   MCH 29.3  26.0 - 34.0 pg   MCHC 32.5  30.0 - 36.0 g/dL   RDW 40.9  81.1 - 91.4 %   Platelets 271  150 - 400 K/uL  BASIC METABOLIC PANEL     Status: Abnormal   Collection Time    10/06/12  5:11 AM      Result Value Range   Sodium 136  135 - 145 mEq/L   Potassium 4.0  3.5 - 5.1 mEq/L   Chloride 100  96 - 112 mEq/L   CO2 28  19 - 32 mEq/L   Glucose, Bld 105 (*) 70 - 99 mg/dL   BUN 25 (*) 6 - 23 mg/dL   Creatinine, Ser 7.82  0.50 - 1.35  mg/dL   Calcium 9.0  8.4 - 95.6 mg/dL   GFR calc non Af Amer 65 (*) >90 mL/min   GFR calc Af Amer 75 (*) >90 mL/min   Comment:            The eGFR has been calculated     using the CKD EPI equation.     This calculation has not been     validated in all clinical     situations.     eGFR's persistently     <90 mL/min signify     possible Chronic Kidney Disease.  HEPARIN LEVEL (UNFRACTIONATED)     Status: None   Collection Time    10/07/12  5:03 AM      Result Value Range   Heparin Unfractionated 0.50  0.30 - 0.70 IU/mL   Comment:            IF HEPARIN RESULTS ARE BELOW  EXPECTED VALUES, AND PATIENT     DOSAGE HAS BEEN CONFIRMED,     SUGGEST FOLLOW UP TESTING     OF ANTITHROMBIN III LEVELS.  CBC     Status: None   Collection Time    10/07/12  5:03 AM      Result Value Range   WBC 6.7  4.0 - 10.5 K/uL   RBC 4.95  4.22 - 5.81 MIL/uL   Hemoglobin 14.9  13.0 - 17.0 g/dL   HCT 11.9  14.7 - 82.9 %   MCV 90.5  78.0 - 100.0 fL   MCH 30.1  26.0 - 34.0 pg   MCHC 33.3  30.0 - 36.0 g/dL   RDW 56.2  13.0 - 86.5 %   Platelets 303  150 - 400 K/uL  BASIC METABOLIC PANEL     Status: Abnormal   Collection Time    10/07/12  5:03 AM      Result Value Range   Sodium 136  135 - 145 mEq/L   Potassium 4.4  3.5 - 5.1 mEq/L   Chloride 99  96 - 112 mEq/L   CO2 29  19 - 32 mEq/L   Glucose, Bld 122 (*) 70 - 99 mg/dL   BUN 23  6 - 23 mg/dL   Creatinine, Ser 7.84  0.50 - 1.35 mg/dL   Calcium 9.2  8.4 - 69.6 mg/dL   GFR calc non Af Amer 70 (*) >90 mL/min   GFR calc Af Amer 82 (*) >90 mL/min   Comment:            The eGFR has been calculated     using the CKD EPI equation.     This calculation has not been     validated in all clinical     situations.     eGFR's persistently     <90 mL/min signify     possible Chronic Kidney Disease.    Imaging: Imaging results have been reviewed  Assessment/Plan:   1. Principal Problem: 2.   Acute respiratory failure, with BiPAP needed on  admit 3. Active Problems: 4.   PAF (paroxysmal atrial fibrillation), he has had periods of NSR since admission 5.   HTN (hypertension) 6.   Hyperlipidemia 7.   Noncompliance, no meds for two weeks prior to admission 8.   Tobacco use 9.   Acute combined systolic and diastolic CHF, NYHA class 3 -- previous normal LV function and moderate AS- re-evaluate; EF now 30-35% with regional WMA 10.   Bilateral lower extremity edema, secondary to acute CHF 11.   Aortic stenosis, severe 12.   CAD, RCA/PDA, 30-40% LM at cath 09/20/12 13.   Cellulitis, lower extremity- treated 14.   PAD (peripheral artery disease), decreased bil. ABIs 15.   Chronic obstructive pulmonary disease 16.   Cardiomyopathy, ischemic- 30-35% 2D  17.   Gout flare. 09/29/12, Lt knee, improved with colchicine. 18.   Time Spent Directly with Patient:  20 minutes  Length of Stay:  LOS: 21 days   For surgery tomorrow. Exam w/o change. Still has BLE edema. Renal function stable.  Runell Gess 10/07/2012, 8:53 AM

## 2012-10-07 NOTE — Progress Notes (Signed)
Patient's heart rate sustaining between 120's and 140s, called on-call physician, orders given, will continue to monitor.

## 2012-10-07 NOTE — Progress Notes (Signed)
Patient in good spirits upon my visit today- he is "ready as I can be" for his surgery tomorrow- support and encouragement provided.  Will follow for SNF plans at d/c.  Reece Levy, MSW, [place] [phone]

## 2012-10-07 NOTE — Progress Notes (Signed)
Physical Therapy Treatment Patient Details Name: [name] MRN: [id number] DOB: 05-May-1950 Today's Date: 10/07/2012 Time: 4098-1191 PT Time Calculation (min): 24 min  PT Assessment / Plan / Recommendation Comments on Treatment Session  Patient continues to be motivated despite pain in hip. Not complaining of the knee pain this session. Patient planning on surgery in AM. Will follow up with surgery to assess continued PT needs. Patient stated he was planning on rehab after hospital stay to increase function independence and mobility as he lives alone and does not have 24 hour care. Will continue to follow up as patient would need 24 hour care to return home safely    Follow Up Recommendations  Home health PT;Supervision/Assistance - 24 hour;SNF (if no 24 hour care would recommend SNF)     Does the patient have the potential to tolerate intense rehabilitation     Barriers to Discharge        Equipment Recommendations  Rolling walker with 5" wheels    Recommendations for Other Services    Frequency Min 3X/week   Plan Discharge plan remains appropriate;Frequency remains appropriate    Precautions / Restrictions Precautions Precautions: None   Pertinent Vitals/Pain 8/10 L hip pain. RN aware    Mobility  Bed Mobility Bed Mobility: Not assessed Transfers Sit to Stand: 6: Modified independent (Device/Increase time);With upper extremity assist;From bed Stand to Sit: 6: Modified independent (Device/Increase time);With upper extremity assist;With armrests;To chair/3-in-1 Ambulation/Gait Ambulation/Gait Assistance: 4: Min guard Ambulation Distance (Feet): 200 Feet Assistive device: Rolling walker Ambulation/Gait Assistance Details: Patient required 3 standing rest breaks today with ambulation due to fatique and increased L hip pain. Slowed ambulation today as noted patient with increased HR. Monitored throughout. Highest noted was 122. After resting patient back down to 109 Gait  Pattern: Step-through pattern;Antalgic;Trunk flexed Gait velocity: decreased    Exercises General Exercises - Lower Extremity Long Arc Quad: AROM;Both;20 reps Heel Slides: AROM;Both;20 reps Hip ABduction/ADduction: AROM;Strengthening;Both;20 reps Hip Flexion/Marching: AROM;Strengthening;Both;10 reps;5 reps;20 reps   PT Diagnosis:    PT Problem List:   PT Treatment Interventions:     PT Goals Acute Rehab PT Goals PT Goal: Stand to Sit - Progress: Progressing toward goal PT Goal: Stand - Progress: Progressing toward goal PT Goal: Ambulate - Progress: Progressing toward goal  Visit Information  Last PT Received On: 10/07/12 Assistance Needed: +1    Subjective Data  Subjective: My hip is really starting to hurt now, my knee feels alot better   Cognition  Cognition Arousal/Alertness: Awake/alert Behavior During Therapy: WFL for tasks assessed/performed Overall Cognitive Status: Within Functional Limits for tasks assessed    Balance     End of Session PT - End of Session Equipment Utilized During Treatment: Gait belt Activity Tolerance: Patient tolerated treatment well Patient left: in chair Nurse Communication: Mobility status   GP     Fredrich Birks 10/07/2012, 8:21 AM  10/07/2012 Fredrich Birks PTA [phone] pager [phone] office

## 2012-10-07 NOTE — Progress Notes (Signed)
ANTICOAGULATION CONSULT NOTE - Follow Up Consult  Pharmacy Consult for Heparin Indication: atrial fibrillation  No Known Allergies  Patient Measurements: Height: 5\' 10"  (177.8 cm) Weight:  (Pt. refused weight, complained of pain) IBW/kg (Calculated) : 73 Heparin Dosing Weight: 78 kg  Vital Signs: Temp: 98.3 F (36.8 C) (06/16 0516) Temp src: Oral (06/16 0516) BP: 121/83 mmHg (06/16 0516) Pulse Rate: 122 (06/16 0814)  Labs:  Recent Labs  10/05/12 0445 10/06/12 0511 10/07/12 0503  HGB 13.8 13.6 14.9  HCT 41.7 41.9 44.8  PLT 282 271 303  HEPARINUNFRC 0.72* 0.58 0.50  CREATININE 1.17 1.17 1.09    Estimated Creatinine Clearance: 71.6 ml/min (by C-G formula based on Cr of 1.09).  Assessment: 62 yo M continues on heparin infusion for afib.  Pt is awaiting CABG, AVR, and MAZE procedure which is planned for 10/08/12.  Heparin level (0.5) is therapeutic on 1500 units/hr.  CBC stable.  No bleeding noted.  Goal of Therapy:  Heparin level 0.3-0.7 units/ml Monitor platelets by anticoagulation protocol: Yes   Plan:  Continue heparin to 1500 units/hr.  Continue daily heparin level and CBC. F/u plans after CABG  Bayard Hugger, PharmD, BCPS  Clinical Pharmacist  Pager: [phone]   10/07/2012 10:46 AM

## 2012-10-07 NOTE — Progress Notes (Signed)
CARDIAC REHAB PHASE I   PRE:  Rate/Rhythm: 104 afib  BP:  Supine:   Sitting: 90/60  Standing:    SaO2: 95%RA  MODE:  Ambulation: 250 ft   POST:  Rate/Rhythm: 129 afib  BP:  Supine:   Sitting: 110/70  Standing:    SaO2: 95-96%RA 1019-1045 Pt walked 250 ft on RA with rolling walker and asst x 1. Gait steady. C/o left hip hurting which got a little better with walk. To recliner with call bell. Will follow up after surgery. No  CP.   Luetta Nutting, RN BSN  10/07/2012 10:42 AM

## 2012-10-08 ENCOUNTER — Encounter (HOSPITAL_COMMUNITY): Payer: Self-pay | Admitting: Certified Registered"

## 2012-10-08 ENCOUNTER — Encounter (HOSPITAL_COMMUNITY): Payer: Self-pay | Admitting: Anesthesiology

## 2012-10-08 ENCOUNTER — Inpatient Hospital Stay (HOSPITAL_COMMUNITY): Payer: Medicaid Other | Admitting: Anesthesiology

## 2012-10-08 ENCOUNTER — Encounter (HOSPITAL_COMMUNITY)
Admission: EM | Disposition: A | Discharge status: Skilled Nursing Facility | Payer: Self-pay | Source: Home / Self Care | Attending: Cardiovascular Disease

## 2012-10-08 ENCOUNTER — Inpatient Hospital Stay (HOSPITAL_COMMUNITY): Payer: Medicaid Other

## 2012-10-08 DIAGNOSIS — I4891 Unspecified atrial fibrillation: Secondary | ICD-10-CM

## 2012-10-08 DIAGNOSIS — I2510 Atherosclerotic heart disease of native coronary artery without angina pectoris: Secondary | ICD-10-CM

## 2012-10-08 DIAGNOSIS — I359 Nonrheumatic aortic valve disorder, unspecified: Secondary | ICD-10-CM

## 2012-10-08 HISTORY — PX: INTRAOPERATIVE TRANSESOPHAGEAL ECHOCARDIOGRAM: SHX5062

## 2012-10-08 HISTORY — PX: ENDOVEIN HARVEST OF GREATER SAPHENOUS VEIN: SHX5059

## 2012-10-08 HISTORY — PX: MAZE: SHX5063

## 2012-10-08 HISTORY — PX: AORTIC VALVE REPLACEMENT: SHX41

## 2012-10-08 HISTORY — PX: CORONARY ARTERY BYPASS GRAFT: SHX141

## 2012-10-08 LAB — CBC
HCT: 35.3 % — ABNORMAL LOW (ref 39.0–52.0)
HCT: 31.4 % — ABNORMAL LOW (ref 39.0–52.0)
Hemoglobin: 11.6 g/dL — ABNORMAL LOW (ref 13.0–17.0)
Hemoglobin: 10.5 g/dL — ABNORMAL LOW (ref 13.0–17.0)
MCH: 30.2 pg (ref 26.0–34.0)
MCH: 29.6 pg (ref 26.0–34.0)
MCHC: 33.4 g/dL (ref 30.0–36.0)
MCHC: 32.9 g/dL (ref 30.0–36.0)
MCHC: 33.4 g/dL (ref 30.0–36.0)
MCV: 89.4 fL (ref 78.0–100.0)
MCV: 88.5 fL (ref 78.0–100.0)
Platelets: 273 10*3/uL (ref 150–400)
Platelets: 137 10*3/uL — ABNORMAL LOW (ref 150–400)
RBC: 3.55 MIL/uL — ABNORMAL LOW (ref 4.22–5.81)
RDW: 15 % (ref 11.5–15.5)
RDW: 14.6 % (ref 11.5–15.5)
WBC: 7.2 10*3/uL (ref 4.0–10.5)

## 2012-10-08 LAB — POCT I-STAT 4, (NA,K, GLUC, HGB,HCT)
Glucose, Bld: 102 mg/dL — ABNORMAL HIGH (ref 70–99)
Glucose, Bld: 107 mg/dL — ABNORMAL HIGH (ref 70–99)
Glucose, Bld: 121 mg/dL — ABNORMAL HIGH (ref 70–99)
Glucose, Bld: 149 mg/dL — ABNORMAL HIGH (ref 70–99)
HCT: 39 % (ref 39.0–52.0)
HCT: 32 % — ABNORMAL LOW (ref 39.0–52.0)
HCT: 36 % — ABNORMAL LOW (ref 39.0–52.0)
HCT: 32 % — ABNORMAL LOW (ref 39.0–52.0)
Hemoglobin: 13.9 g/dL (ref 13.0–17.0)
Hemoglobin: 13.3 g/dL (ref 13.0–17.0)
Hemoglobin: 10.9 g/dL — ABNORMAL LOW (ref 13.0–17.0)
Hemoglobin: 12.2 g/dL — ABNORMAL LOW (ref 13.0–17.0)
Hemoglobin: 10.9 g/dL — ABNORMAL LOW (ref 13.0–17.0)
Hemoglobin: 11.2 g/dL — ABNORMAL LOW (ref 13.0–17.0)
Potassium: 4.4 mEq/L (ref 3.5–5.1)
Potassium: 4.8 mEq/L (ref 3.5–5.1)
Potassium: 5.4 mEq/L — ABNORMAL HIGH (ref 3.5–5.1)
Potassium: 4.4 mEq/L (ref 3.5–5.1)
Sodium: 139 mEq/L (ref 135–145)
Sodium: 133 mEq/L — ABNORMAL LOW (ref 135–145)
Sodium: 135 mEq/L (ref 135–145)
Sodium: 135 mEq/L (ref 135–145)
Sodium: 138 mEq/L (ref 135–145)

## 2012-10-08 LAB — BLOOD GAS, ARTERIAL
Acid-Base Excess: 0.2 mmol/L (ref 0.0–2.0)
Bicarbonate: 24.3 mEq/L — ABNORMAL HIGH (ref 20.0–24.0)
Drawn by: 34779
FIO2: 0.21 %
O2 Saturation: 94.8 %
Patient temperature: 98.6
TCO2: 25.5 mmol/L (ref 0–100)
pCO2 arterial: 39.7 mmHg (ref 35.0–45.0)
pH, Arterial: 7.404 (ref 7.350–7.450)
pO2, Arterial: 73 mmHg — ABNORMAL LOW (ref 80.0–100.0)

## 2012-10-08 LAB — POCT I-STAT 3, ART BLOOD GAS (G3+)
Acid-Base Excess: 1 mmol/L (ref 0.0–2.0)
Acid-base deficit: 4 mmol/L — ABNORMAL HIGH (ref 0.0–2.0)
Acid-base deficit: 2 mmol/L (ref 0.0–2.0)
Bicarbonate: 25.7 mEq/L — ABNORMAL HIGH (ref 20.0–24.0)
Bicarbonate: 23.6 mEq/L (ref 20.0–24.0)
Bicarbonate: 22.4 mEq/L (ref 20.0–24.0)
O2 Saturation: 93 %
pCO2 arterial: 56.4 mmHg — ABNORMAL HIGH (ref 35.0–45.0)
pCO2 arterial: 36.2 mmHg (ref 35.0–45.0)
pH, Arterial: 7.313 — ABNORMAL LOW (ref 7.350–7.450)
pH, Arterial: 7.267 — ABNORMAL LOW (ref 7.350–7.450)
pO2, Arterial: 352 mmHg — ABNORMAL HIGH (ref 80.0–100.0)
pO2, Arterial: 74 mmHg — ABNORMAL LOW (ref 80.0–100.0)
pO2, Arterial: 76 mmHg — ABNORMAL LOW (ref 80.0–100.0)

## 2012-10-08 LAB — URINALYSIS, ROUTINE W REFLEX MICROSCOPIC
Ketones, ur: NEGATIVE mg/dL
Leukocytes, UA: NEGATIVE
Nitrite: NEGATIVE
Specific Gravity, Urine: 1.022 (ref 1.005–1.030)
pH: 6 (ref 5.0–8.0)

## 2012-10-08 LAB — APTT: aPTT: 41 seconds — ABNORMAL HIGH (ref 24–37)

## 2012-10-08 LAB — POCT I-STAT, CHEM 8
BUN: 15 mg/dL (ref 6–23)
Calcium, Ion: 1.22 mmol/L (ref 1.13–1.30)
Chloride: 106 mEq/L (ref 96–112)
Potassium: 4.5 mEq/L (ref 3.5–5.1)

## 2012-10-08 LAB — HEMOGLOBIN A1C
Hgb A1c MFr Bld: 5.2 % (ref <5.7)
Mean Plasma Glucose: 103 mg/dL (ref <117)

## 2012-10-08 LAB — URINE MICROSCOPIC-ADD ON

## 2012-10-08 LAB — GLUCOSE, CAPILLARY
Glucose-Capillary: 80 mg/dL (ref 70–99)
Glucose-Capillary: 92 mg/dL (ref 70–99)
Glucose-Capillary: 94 mg/dL (ref 70–99)
Glucose-Capillary: 103 mg/dL — ABNORMAL HIGH (ref 70–99)
Glucose-Capillary: 107 mg/dL — ABNORMAL HIGH (ref 70–99)

## 2012-10-08 LAB — CREATININE, SERUM
Creatinine, Ser: 0.82 mg/dL (ref 0.50–1.35)
GFR calc Af Amer: >90 mL/min (ref >90)
GFR calc non Af Amer: >90 mL/min (ref >90)

## 2012-10-08 LAB — MAGNESIUM: Magnesium: 3 mg/dL — ABNORMAL HIGH (ref 1.5–2.5)

## 2012-10-08 LAB — HEPARIN LEVEL (UNFRACTIONATED): Heparin Unfractionated: 0.52 IU/mL (ref 0.30–0.70)

## 2012-10-08 LAB — BASIC METABOLIC PANEL
Calcium: 9.3 mg/dL (ref 8.4–10.5)
GFR calc Af Amer: >90 mL/min (ref >90)
GFR calc non Af Amer: 78 mL/min — ABNORMAL LOW (ref >90)
Potassium: 4 mEq/L (ref 3.5–5.1)
Sodium: 137 mEq/L (ref 135–145)

## 2012-10-08 LAB — HEMOGLOBIN AND HEMATOCRIT, BLOOD
HCT: 35.6 % — ABNORMAL LOW (ref 39.0–52.0)
Hemoglobin: 12 g/dL — ABNORMAL LOW (ref 13.0–17.0)

## 2012-10-08 LAB — PROTIME-INR: INR: 1.29 (ref 0.00–1.49)

## 2012-10-08 LAB — PLATELET COUNT: Platelets: 197 10*3/uL (ref 150–400)

## 2012-10-08 SURGERY — CORONARY ARTERY BYPASS GRAFTING (CABG)
Anesthesia: General | Site: Leg Upper | Wound class: Clean

## 2012-10-08 MED ORDER — ESMOLOL HCL 10 MG/ML IV SOLN
INTRAVENOUS | Status: DC | PRN
  Administered 2012-10-08: 10 mg via INTRAVENOUS

## 2012-10-08 MED ORDER — ACETAMINOPHEN 10 MG/ML IV SOLN
1000.0000 mg | Freq: Once | INTRAVENOUS | Status: AC
  Administered 2012-10-08: 1000 mg via INTRAVENOUS
  Filled 2012-10-08: qty 100

## 2012-10-08 MED ORDER — PHENYLEPHRINE HCL 10 MG/ML IJ SOLN
20.0000 mg | INTRAMUSCULAR | Status: DC | PRN
  Administered 2012-10-08: 20 ug/min via INTRAVENOUS

## 2012-10-08 MED ORDER — ACETAMINOPHEN 160 MG/5ML PO SOLN: 975.0000 mg | Freq: Four times a day (QID) | ORAL | Status: DC

## 2012-10-08 MED ORDER — ROCURONIUM BROMIDE 100 MG/10ML IV SOLN
INTRAVENOUS | Status: DC | PRN
  Administered 2012-10-08: 30 mg via INTRAVENOUS
  Administered 2012-10-08: 50 mg via INTRAVENOUS

## 2012-10-08 MED ORDER — CEFUROXIME SODIUM 1.5 G IJ SOLR
1.5000 g | Freq: Two times a day (BID) | INTRAMUSCULAR | Status: AC
  Administered 2012-10-08 – 2012-10-10 (×4): 1.5 g via INTRAVENOUS
  Filled 2012-10-08 (×5): qty 1.5

## 2012-10-08 MED ORDER — MILRINONE IN DEXTROSE 20 MG/100ML IV SOLN
0.2500 ug/kg/min | INTRAVENOUS | Status: DC
  Filled 2012-10-08: qty 100

## 2012-10-08 MED ORDER — ATORVASTATIN CALCIUM 40 MG PO TABS
40.0000 mg | ORAL_TABLET | Freq: Every day | ORAL | Status: DC
  Administered 2012-10-09 – 2012-10-10 (×2): 40 mg via ORAL
  Filled 2012-10-08 (×3): qty 1

## 2012-10-08 MED ORDER — PROTAMINE SULFATE 10 MG/ML IV SOLN
INTRAVENOUS | Status: DC | PRN
  Administered 2012-10-08 (×7): 20 mg via INTRAVENOUS

## 2012-10-08 MED ORDER — MAGNESIUM SULFATE 40 MG/ML IJ SOLN
4.0000 g | Freq: Once | INTRAMUSCULAR | Status: AC
  Administered 2012-10-08: 4 g via INTRAVENOUS
  Filled 2012-10-08: qty 100

## 2012-10-08 MED ORDER — PHENYLEPHRINE HCL 10 MG/ML IJ SOLN
INTRAMUSCULAR | Status: DC | PRN
  Administered 2012-10-08 (×3): 40 ug via INTRAVENOUS
  Administered 2012-10-08: 80 ug via INTRAVENOUS
  Administered 2012-10-08 (×2): 40 ug via INTRAVENOUS
  Administered 2012-10-08: 80 ug via INTRAVENOUS
  Administered 2012-10-08 (×5): 40 ug via INTRAVENOUS
  Administered 2012-10-08 (×3): 80 ug via INTRAVENOUS

## 2012-10-08 MED ORDER — POTASSIUM CHLORIDE 10 MEQ/50ML IV SOLN: 10.0000 meq | INTRAVENOUS | Status: AC

## 2012-10-08 MED ORDER — PROPOFOL 10 MG/ML IV BOLUS
INTRAVENOUS | Status: DC | PRN
  Administered 2012-10-08: 40 mg via INTRAVENOUS

## 2012-10-08 MED ORDER — SODIUM CHLORIDE 0.9 % IV SOLN
10.0000 g | INTRAVENOUS | Status: DC | PRN
  Administered 2012-10-08: 5 g/h via INTRAVENOUS

## 2012-10-08 MED ORDER — LACTATED RINGERS IV SOLN
INTRAVENOUS | Status: DC | PRN
  Administered 2012-10-08: 07:00:00 via INTRAVENOUS

## 2012-10-08 MED ORDER — MILRINONE IN DEXTROSE 20 MG/100ML IV SOLN
INTRAVENOUS | Status: DC | PRN
  Administered 2012-10-08: .3 ug/kg/min via INTRAVENOUS

## 2012-10-08 MED ORDER — SODIUM CHLORIDE 0.9 % IJ SOLN
OROMUCOSAL | Status: DC | PRN
  Administered 2012-10-08 (×3): via TOPICAL

## 2012-10-08 MED ORDER — FAMOTIDINE IN NACL 20-0.9 MG/50ML-% IV SOLN
20.0000 mg | Freq: Two times a day (BID) | INTRAVENOUS | Status: AC
  Administered 2012-10-08: 20 mg via INTRAVENOUS

## 2012-10-08 MED ORDER — SODIUM CHLORIDE 0.9 % IJ SOLN
3.0000 mL | Freq: Two times a day (BID) | INTRAMUSCULAR | Status: DC
  Administered 2012-10-09 – 2012-10-10 (×3): 3 mL via INTRAVENOUS

## 2012-10-08 MED ORDER — SODIUM CHLORIDE 0.9 % IV SOLN: 250.0000 mL | INTRAVENOUS | Status: DC

## 2012-10-08 MED ORDER — HEMOSTATIC AGENTS (NO CHARGE) OPTIME
TOPICAL | Status: DC | PRN
  Administered 2012-10-08: 1 via TOPICAL

## 2012-10-08 MED ORDER — OXYCODONE HCL 5 MG PO TABS
5.0000 mg | ORAL_TABLET | ORAL | Status: DC | PRN
  Administered 2012-10-09 – 2012-10-10 (×2): 5 mg via ORAL
  Filled 2012-10-08 (×2): qty 1

## 2012-10-08 MED ORDER — ASPIRIN EC 325 MG PO TBEC
325.0000 mg | DELAYED_RELEASE_TABLET | Freq: Every day | ORAL | Status: DC
  Administered 2012-10-09: 325 mg via ORAL
  Filled 2012-10-08 (×2): qty 1

## 2012-10-08 MED ORDER — MIDAZOLAM HCL 2 MG/2ML IJ SOLN: 2.0000 mg | INTRAMUSCULAR | Status: DC | PRN

## 2012-10-08 MED ORDER — HEPARIN SODIUM (PORCINE) 1000 UNIT/ML IJ SOLN
INTRAMUSCULAR | Status: DC | PRN
  Administered 2012-10-08: 30000 [IU] via INTRAVENOUS

## 2012-10-08 MED ORDER — SODIUM CHLORIDE 0.9 % IV SOLN
5.0000 g/h | Freq: Once | INTRAVENOUS | Status: DC
  Filled 2012-10-08: qty 20

## 2012-10-08 MED ORDER — DOPAMINE-DEXTROSE 3.2-5 MG/ML-% IV SOLN
0.0000 ug/kg/min | INTRAVENOUS | Status: DC
  Administered 2012-10-08: 2 ug/kg/min via INTRAVENOUS

## 2012-10-08 MED ORDER — LACTATED RINGERS IV SOLN: 500.0000 mL | Freq: Once | INTRAVENOUS | Status: AC | PRN

## 2012-10-08 MED ORDER — VECURONIUM BROMIDE 10 MG IV SOLR
INTRAVENOUS | Status: DC | PRN
  Administered 2012-10-08 (×2): 10 mg via INTRAVENOUS
  Administered 2012-10-08 (×2): 3 mg via INTRAVENOUS
  Administered 2012-10-08: 4 mg via INTRAVENOUS

## 2012-10-08 MED ORDER — METOPROLOL TARTRATE 12.5 MG HALF TABLET
12.5000 mg | ORAL_TABLET | Freq: Two times a day (BID) | ORAL | Status: DC
  Administered 2012-10-10 (×2): 12.5 mg via ORAL
  Filled 2012-10-08 (×7): qty 1

## 2012-10-08 MED ORDER — ALBUMIN HUMAN 5 % IV SOLN
INTRAVENOUS | Status: DC | PRN
  Administered 2012-10-08 (×2): via INTRAVENOUS

## 2012-10-08 MED ORDER — BISACODYL 5 MG PO TBEC
10.0000 mg | DELAYED_RELEASE_TABLET | Freq: Every day | ORAL | Status: DC
  Administered 2012-10-09 – 2012-10-10 (×2): 10 mg via ORAL
  Filled 2012-10-08 (×2): qty 2

## 2012-10-08 MED ORDER — LACTATED RINGERS IV SOLN
INTRAVENOUS | Status: DC | PRN
  Administered 2012-10-08 (×2): via INTRAVENOUS

## 2012-10-08 MED ORDER — NITROGLYCERIN IN D5W 200-5 MCG/ML-% IV SOLN
0.0000 ug/min | INTRAVENOUS | Status: DC
  Administered 2012-10-08: 10 ug/min via INTRAVENOUS

## 2012-10-08 MED ORDER — LACTATED RINGERS IV SOLN
INTRAVENOUS | Status: DC
  Administered 2012-10-08 – 2012-10-09 (×2): 20 mL/h via INTRAVENOUS

## 2012-10-08 MED ORDER — ALBUMIN HUMAN 5 % IV SOLN
250.0000 mL | INTRAVENOUS | Status: AC | PRN
  Administered 2012-10-08 (×2): 250 mL via INTRAVENOUS

## 2012-10-08 MED ORDER — MILRINONE IN DEXTROSE 20 MG/100ML IV SOLN
0.3000 ug/kg/min | INTRAVENOUS | Status: AC
  Administered 2012-10-08 (×2): 0.3 ug/kg/min via INTRAVENOUS
  Filled 2012-10-08: qty 100

## 2012-10-08 MED ORDER — SODIUM CHLORIDE 0.9 % IV SOLN
INTRAVENOUS | Status: DC
  Administered 2012-10-08: 1.5 [IU]/h via INTRAVENOUS
  Filled 2012-10-08: qty 1

## 2012-10-08 MED ORDER — SODIUM CHLORIDE 0.9 % IV SOLN
INTRAVENOUS | Status: DC
  Administered 2012-10-08: 20 mL/h via INTRAVENOUS

## 2012-10-08 MED ORDER — SODIUM CHLORIDE 0.9 % IV SOLN
100.0000 [IU] | INTRAVENOUS | Status: DC | PRN
  Administered 2012-10-08: 1.3 [IU]/h via INTRAVENOUS

## 2012-10-08 MED ORDER — SODIUM CHLORIDE 0.45 % IV SOLN
INTRAVENOUS | Status: DC
  Administered 2012-10-08: 20 mL/h via INTRAVENOUS

## 2012-10-08 MED ORDER — ARTIFICIAL TEARS OP OINT
TOPICAL_OINTMENT | OPHTHALMIC | Status: DC | PRN
  Administered 2012-10-08: 1 via OPHTHALMIC

## 2012-10-08 MED ORDER — 0.9 % SODIUM CHLORIDE (POUR BTL) OPTIME
TOPICAL | Status: DC | PRN
  Administered 2012-10-08: 6000 mL

## 2012-10-08 MED ORDER — BISACODYL 10 MG RE SUPP: 10.0000 mg | Freq: Every day | RECTAL | Status: DC

## 2012-10-08 MED ORDER — METOPROLOL TARTRATE 25 MG/10 ML ORAL SUSPENSION
12.5000 mg | Freq: Two times a day (BID) | ORAL | Status: DC
  Filled 2012-10-08 (×7): qty 5

## 2012-10-08 MED ORDER — ACETAMINOPHEN 500 MG PO TABS
1000.0000 mg | ORAL_TABLET | Freq: Four times a day (QID) | ORAL | Status: DC
  Administered 2012-10-09 – 2012-10-11 (×9): 1000 mg via ORAL
  Filled 2012-10-08 (×13): qty 2

## 2012-10-08 MED ORDER — ASPIRIN 81 MG PO CHEW
324.0000 mg | CHEWABLE_TABLET | Freq: Every day | ORAL | Status: DC
  Administered 2012-10-10: 324 mg
  Filled 2012-10-08: qty 4

## 2012-10-08 MED ORDER — MORPHINE SULFATE 2 MG/ML IJ SOLN
2.0000 mg | INTRAMUSCULAR | Status: DC | PRN
  Administered 2012-10-09 (×2): 2 mg via INTRAVENOUS
  Filled 2012-10-08 (×2): qty 1

## 2012-10-08 MED ORDER — PANTOPRAZOLE SODIUM 40 MG PO TBEC
40.0000 mg | DELAYED_RELEASE_TABLET | Freq: Every day | ORAL | Status: DC
  Administered 2012-10-09 – 2012-10-10 (×2): 40 mg via ORAL
  Filled 2012-10-08 (×2): qty 1

## 2012-10-08 MED ORDER — INSULIN REGULAR BOLUS VIA INFUSION
0.0000 [IU] | Freq: Three times a day (TID) | INTRAVENOUS | Status: DC
Start: 2012-10-08 — End: 2012-10-11
  Filled 2012-10-08: qty 10

## 2012-10-08 MED ORDER — DOCUSATE SODIUM 100 MG PO CAPS
200.0000 mg | ORAL_CAPSULE | Freq: Every day | ORAL | Status: DC
  Administered 2012-10-09 – 2012-10-10 (×2): 200 mg via ORAL
  Filled 2012-10-08 (×5): qty 2

## 2012-10-08 MED ORDER — LEVALBUTEROL HCL 0.63 MG/3ML IN NEBU
0.6300 mg | INHALATION_SOLUTION | Freq: Three times a day (TID) | RESPIRATORY_TRACT | Status: DC
  Administered 2012-10-08 – 2012-10-11 (×8): 0.63 mg via RESPIRATORY_TRACT
  Filled 2012-10-08 (×19): qty 3

## 2012-10-08 MED ORDER — VANCOMYCIN HCL IN DEXTROSE 1-5 GM/200ML-% IV SOLN
1000.0000 mg | Freq: Once | INTRAVENOUS | Status: AC
  Administered 2012-10-08: 1000 mg via INTRAVENOUS
  Filled 2012-10-08: qty 200

## 2012-10-08 MED ORDER — MIDAZOLAM HCL 5 MG/5ML IJ SOLN
INTRAMUSCULAR | Status: DC | PRN
  Administered 2012-10-08: 6 mg via INTRAVENOUS
  Administered 2012-10-08 (×4): 2 mg via INTRAVENOUS

## 2012-10-08 MED ORDER — FENTANYL CITRATE 0.05 MG/ML IJ SOLN
INTRAMUSCULAR | Status: DC | PRN
  Administered 2012-10-08 (×2): 100 ug via INTRAVENOUS
  Administered 2012-10-08 (×2): 150 ug via INTRAVENOUS
  Administered 2012-10-08: 300 ug via INTRAVENOUS
  Administered 2012-10-08: 50 ug via INTRAVENOUS
  Administered 2012-10-08: 150 ug via INTRAVENOUS

## 2012-10-08 MED ORDER — ONDANSETRON HCL 4 MG/2ML IJ SOLN: 4.0000 mg | Freq: Four times a day (QID) | INTRAMUSCULAR | Status: DC | PRN

## 2012-10-08 MED ORDER — SODIUM CHLORIDE 0.9 % IJ SOLN
3.0000 mL | INTRAMUSCULAR | Status: DC | PRN
  Administered 2012-10-09: 3 mL via INTRAVENOUS

## 2012-10-08 MED ORDER — PHENYLEPHRINE HCL 10 MG/ML IJ SOLN
0.0000 ug/min | INTRAVENOUS | Status: DC
  Administered 2012-10-08: 10 ug/min via INTRAVENOUS
  Filled 2012-10-08 (×3): qty 2

## 2012-10-08 MED ORDER — ALBUTEROL SULFATE HFA 108 (90 BASE) MCG/ACT IN AERS
INHALATION_SPRAY | RESPIRATORY_TRACT | Status: DC | PRN
  Administered 2012-10-08: 4 via RESPIRATORY_TRACT
  Administered 2012-10-08 (×2): 2 via RESPIRATORY_TRACT

## 2012-10-08 MED ORDER — METOPROLOL TARTRATE 1 MG/ML IV SOLN: 2.5000 mg | INTRAVENOUS | Status: DC | PRN

## 2012-10-08 MED ORDER — MORPHINE SULFATE 2 MG/ML IJ SOLN
1.0000 mg | INTRAMUSCULAR | Status: AC | PRN
  Administered 2012-10-08 (×2): 1 mg via INTRAVENOUS
  Filled 2012-10-08: qty 1

## 2012-10-08 MED ORDER — DEXMEDETOMIDINE HCL IN NACL 200 MCG/50ML IV SOLN
0.1000 ug/kg/h | INTRAVENOUS | Status: DC
  Administered 2012-10-08: 0.3 ug/kg/h via INTRAVENOUS
  Filled 2012-10-08: qty 100

## 2012-10-08 SURGICAL SUPPLY — 144 items
ADAPTER CARDIO PERF ANTE/RETRO (ADAPTER) ×4 IMPLANT
APPLICATOR COTTON TIP 6IN STRL (MISCELLANEOUS) IMPLANT
ATRICLIP EXCLUSION 40 STD HAND (Clip) ×4 IMPLANT
ATTRACTOMAT 16X20 MAGNETIC DRP (DRAPES) ×4 IMPLANT
BAG DECANTER FOR FLEXI CONT (MISCELLANEOUS) ×4 IMPLANT
BANDAGE ELASTIC 4 VELCRO ST LF (GAUZE/BANDAGES/DRESSINGS) ×8 IMPLANT
BANDAGE ELASTIC 6 VELCRO ST LF (GAUZE/BANDAGES/DRESSINGS) ×8 IMPLANT
BANDAGE GAUZE ELAST BULKY 4 IN (GAUZE/BANDAGES/DRESSINGS) ×8 IMPLANT
BLADE STERNUM SYSTEM 6 (BLADE) ×4 IMPLANT
BLADE SURG 15 STRL LF DISP TIS (BLADE) ×6 IMPLANT
BLADE SURG 15 STRL SS (BLADE) ×2
BLADE SURG ROTATE 9660 (MISCELLANEOUS) IMPLANT
BOOT SUTURE AID YELLOW STND (SUTURE) ×4 IMPLANT
CANISTER SUCTION 2500CC (MISCELLANEOUS) ×4 IMPLANT
CANN PRFSN .5XCNCT 15X34-48 (MISCELLANEOUS) ×3
CANN PRFSN 3/8X14X24FR PCFC (MISCELLANEOUS) ×3
CANN PRFSN 3/8XCNCT ST RT ANG (MISCELLANEOUS) ×3
CANNULA AORTIC HI-FLOW 6.5M20F (CANNULA) ×4 IMPLANT
CANNULA GUNDRY RCSP 15FR (MISCELLANEOUS) IMPLANT
CANNULA PRFSN .5XCNCT 15X34-48 (MISCELLANEOUS) ×3 IMPLANT
CANNULA PRFSN 3/8X14X24FR PCFC (MISCELLANEOUS) ×3 IMPLANT
CANNULA PRFSN 3/8XCNCT RT ANG (MISCELLANEOUS) ×3 IMPLANT
CANNULA VEN 2 STAGE (MISCELLANEOUS) ×1
CANNULA VEN MTL TIP RT (MISCELLANEOUS) ×2
CANNULA VENOUS DUAL 32/40 (CANNULA) IMPLANT
CARDIOBLATE CARDIAC ABLATION (MISCELLANEOUS) ×4
CATH CPB KIT GERHARDT (MISCELLANEOUS) ×4 IMPLANT
CATH HEART VENT LEFT (CATHETERS) ×3 IMPLANT
CATH RETROPLEGIA CORONARY 14FR (CATHETERS) ×8 IMPLANT
CATH ROBINSON RED A/P 18FR (CATHETERS) ×16 IMPLANT
CATH THORACIC 28FR (CATHETERS) ×4 IMPLANT
CATH THORACIC 36FR (CATHETERS) IMPLANT
CATH THORACIC 36FR RT ANG (CATHETERS) IMPLANT
CATH/SQUID NICHOLS JEHLE COR (CATHETERS) IMPLANT
CLIP TI MEDIUM 24 (CLIP) IMPLANT
CLIP TI WIDE RED SMALL 24 (CLIP) IMPLANT
CLOTH BEACON ORANGE TIMEOUT ST (SAFETY) ×4 IMPLANT
CONN 1/2X3/8X3/8 Y GISH (MISCELLANEOUS) ×4 IMPLANT
CONNECTOR 1/2X3/8X1/2 3 WAY (MISCELLANEOUS) ×1
CONNECTOR 1/2X3/8X1/2 3WAY (MISCELLANEOUS) ×3 IMPLANT
CONT SPEC 4OZ CLIKSEAL STRL BL (MISCELLANEOUS) ×4 IMPLANT
COVER SURGICAL LIGHT HANDLE (MISCELLANEOUS) ×4 IMPLANT
CRADLE DONUT ADULT HEAD (MISCELLANEOUS) ×4 IMPLANT
DEVICE CARDIOBLATE CARDIAC ABL (MISCELLANEOUS) ×3 IMPLANT
DRAIN CHANNEL 28F RND 3/8 FF (WOUND CARE) ×4 IMPLANT
DRAIN CHANNEL 32F RND 10.7 FF (WOUND CARE) IMPLANT
DRAPE CARDIOVASCULAR INCISE (DRAPES) ×1
DRAPE SLUSH MACHINE 52X66 (DRAPES) IMPLANT
DRAPE SLUSH/WARMER DISC (DRAPES) ×4 IMPLANT
DRAPE SRG 135X102X78XABS (DRAPES) ×3 IMPLANT
DRSG COVADERM 4X14 (GAUZE/BANDAGES/DRESSINGS) ×4 IMPLANT
ELECT BLADE 4.0 EZ CLEAN MEGAD (MISCELLANEOUS) ×4
ELECT CAUTERY BLADE 6.4 (BLADE) ×8 IMPLANT
ELECT REM PT RETURN 9FT ADLT (ELECTROSURGICAL) ×8
ELECTRODE BLDE 4.0 EZ CLN MEGD (MISCELLANEOUS) ×3 IMPLANT
ELECTRODE REM PT RTRN 9FT ADLT (ELECTROSURGICAL) ×6 IMPLANT
GLOVE BIO SURGEON STRL SZ 6 (GLOVE) ×12 IMPLANT
GLOVE BIO SURGEON STRL SZ 6.5 (GLOVE) ×24 IMPLANT
GLOVE BIO SURGEON STRL SZ7 (GLOVE) ×8 IMPLANT
GLOVE BIO SURGEON STRL SZ7.5 (GLOVE) ×8 IMPLANT
GLOVE BIOGEL PI IND STRL 6 (GLOVE) IMPLANT
GLOVE BIOGEL PI IND STRL 6.5 (GLOVE) IMPLANT
GLOVE BIOGEL PI IND STRL 7.0 (GLOVE) IMPLANT
GLOVE BIOGEL PI INDICATOR 6 (GLOVE)
GLOVE BIOGEL PI INDICATOR 6.5 (GLOVE)
GLOVE BIOGEL PI INDICATOR 7.0 (GLOVE)
GOWN PREVENTION PLUS XLARGE (GOWN DISPOSABLE) ×12 IMPLANT
GOWN STRL NON-REIN LRG LVL3 (GOWN DISPOSABLE) ×36 IMPLANT
HEMOSTAT POWDER SURGIFOAM 1G (HEMOSTASIS) ×12 IMPLANT
HEMOSTAT SURGICEL 2X14 (HEMOSTASIS) ×4 IMPLANT
INSERT FOGARTY 61MM (MISCELLANEOUS) IMPLANT
INSERT FOGARTY XLG (MISCELLANEOUS) IMPLANT
KIT BASIN OR (CUSTOM PROCEDURE TRAY) ×4 IMPLANT
KIT ROOM TURNOVER OR (KITS) ×4 IMPLANT
KIT SUCTION CATH 14FR (SUCTIONS) ×12 IMPLANT
KIT VASOVIEW W/TROCAR VH 2000 (KITS) ×4 IMPLANT
LEAD PACING MYOCARDI (MISCELLANEOUS) ×4 IMPLANT
LOOP VESSEL SUPERMAXI WHITE (MISCELLANEOUS) ×4 IMPLANT
MARKER GRAFT CORONARY BYPASS (MISCELLANEOUS) ×12 IMPLANT
NEEDLE 18GX1X1/2 (RX/OR ONLY) (NEEDLE) ×4 IMPLANT
NS IRRIG 1000ML POUR BTL (IV SOLUTION) ×24 IMPLANT
PACK OPEN HEART (CUSTOM PROCEDURE TRAY) ×4 IMPLANT
PAD ARMBOARD 7.5X6 YLW CONV (MISCELLANEOUS) ×8 IMPLANT
PAD ELECT DEFIB RADIOL ZOLL (MISCELLANEOUS) ×8 IMPLANT
PENCIL BUTTON HOLSTER BLD 10FT (ELECTRODE) ×4 IMPLANT
PROBE CRYO2-ABLATION MALLABLE (MISCELLANEOUS) IMPLANT
PUNCH AORTIC ROTATE 4.0MM (MISCELLANEOUS) IMPLANT
PUNCH AORTIC ROTATE 4.5MM 8IN (MISCELLANEOUS) ×4 IMPLANT
PUNCH AORTIC ROTATE 5MM 8IN (MISCELLANEOUS) IMPLANT
SOLUTION ANTI FOG 6CC (MISCELLANEOUS) IMPLANT
SPONGE GAUZE 4X4 12PLY (GAUZE/BANDAGES/DRESSINGS) ×8 IMPLANT
SPONGE LAP 18X18 X RAY DECT (DISPOSABLE) ×12 IMPLANT
SPONGE LAP 4X18 X RAY DECT (DISPOSABLE) IMPLANT
SUT BONE WAX W31G (SUTURE) ×4 IMPLANT
SUT ETHIBON 2 0 V 52N 30 (SUTURE) ×8 IMPLANT
SUT ETHIBOND 2 0 SH (SUTURE) ×1
SUT ETHIBOND 2 0 SH 36X2 (SUTURE) ×3 IMPLANT
SUT MNCRL AB 4-0 PS2 18 (SUTURE) IMPLANT
SUT PROLENE 3 0 RB 1 (SUTURE) ×8 IMPLANT
SUT PROLENE 3 0 SH 1 (SUTURE) ×4 IMPLANT
SUT PROLENE 3 0 SH DA (SUTURE) IMPLANT
SUT PROLENE 3 0 SH1 36 (SUTURE) ×16 IMPLANT
SUT PROLENE 4 0 RB 1 (SUTURE) ×4
SUT PROLENE 4 0 SH DA (SUTURE) IMPLANT
SUT PROLENE 4 0 TF (SUTURE) ×8 IMPLANT
SUT PROLENE 4-0 RB1 .5 CRCL 36 (SUTURE) ×12 IMPLANT
SUT PROLENE 5 0 C 1 36 (SUTURE) IMPLANT
SUT PROLENE 6 0 C 1 30 (SUTURE) IMPLANT
SUT PROLENE 6 0 CC (SUTURE) ×8 IMPLANT
SUT PROLENE 7 0 BV 1 (SUTURE) IMPLANT
SUT PROLENE 7 0 BV1 MDA (SUTURE) ×8 IMPLANT
SUT PROLENE 7.0 RB 3 (SUTURE) IMPLANT
SUT PROLENE 8 0 BV175 6 (SUTURE) IMPLANT
SUT SILK  1 MH (SUTURE)
SUT SILK 1 MH (SUTURE) IMPLANT
SUT SILK 2 0 SH CR/8 (SUTURE) ×4 IMPLANT
SUT SILK 2 0 TIES 10X30 (SUTURE) ×4 IMPLANT
SUT SILK 3 0 SH CR/8 (SUTURE) IMPLANT
SUT STEEL 6MS V (SUTURE) ×4 IMPLANT
SUT STEEL STERNAL CCS#1 18IN (SUTURE) IMPLANT
SUT STEEL SZ 6 DBL 3X14 BALL (SUTURE) ×4 IMPLANT
SUT VIC AB 1 CTX 18 (SUTURE) ×8 IMPLANT
SUT VIC AB 1 CTX 36 (SUTURE)
SUT VIC AB 1 CTX36XBRD ANBCTR (SUTURE) IMPLANT
SUT VIC AB 2-0 CT1 27 (SUTURE) ×2
SUT VIC AB 2-0 CT1 TAPERPNT 27 (SUTURE) ×6 IMPLANT
SUT VIC AB 2-0 CTX 27 (SUTURE) IMPLANT
SUT VIC AB 3-0 SH 27 (SUTURE)
SUT VIC AB 3-0 SH 27X BRD (SUTURE) IMPLANT
SUT VIC AB 3-0 X1 27 (SUTURE) ×8 IMPLANT
SUT VICRYL 4-0 PS2 18IN ABS (SUTURE) IMPLANT
SUTURE E-PAK OPEN HEART (SUTURE) ×4 IMPLANT
SYS ATRICLIP LAA EXCLUSION 45 (CLIP) IMPLANT
SYSTEM SAHARA CHEST DRAIN ATS (WOUND CARE) ×4 IMPLANT
TOWEL OR 17X24 6PK STRL BLUE (TOWEL DISPOSABLE) ×8 IMPLANT
TOWEL OR 17X26 10 PK STRL BLUE (TOWEL DISPOSABLE) ×8 IMPLANT
TRAY FOLEY IC TEMP SENS 14FR (CATHETERS) ×4 IMPLANT
TUBE FEEDING 8FR 16IN STR KANG (MISCELLANEOUS) ×4 IMPLANT
TUBE SUCT INTRACARD DLP 20F (MISCELLANEOUS) ×4 IMPLANT
TUBING INSUFFLATION 10FT LAP (TUBING) ×4 IMPLANT
UNDERPAD 30X30 INCONTINENT (UNDERPADS AND DIAPERS) ×4 IMPLANT
VALVE MAGNA EASE AORTIC 23MM (Prosthesis & Implant Heart) ×4 IMPLANT
VENT LEFT HEART 12002 (CATHETERS) ×4
WATER STERILE IRR 1000ML POUR (IV SOLUTION) ×8 IMPLANT

## 2012-10-08 NOTE — Transfer of Care (Signed)
Immediate Anesthesia Transfer of Care Note  Patient: [name]  Procedure(s) Performed: Procedure(s) with comments: CORONARY ARTERY BYPASS GRAFTING (CABG) (N/A) - Coronary Artery bypass Graft times two utilizing the left greater saphenous vein harvested endoscopically AORTIC VALVE REPLACEMENT (AVR) (N/A) MAZE (N/A) INTRAOPERATIVE TRANSESOPHAGEAL ECHOCARDIOGRAM (N/A) ENDOVEIN HARVEST OF GREATER SAPHENOUS VEIN (Bilateral)  Patient Location: SICU  Anesthesia Type:General  Level of Consciousness: Patient remains intubated per anesthesia plan  Airway & Oxygen Therapy: Patient remains intubated per anesthesia plan and Patient placed on Ventilator (see vital sign flow sheet for setting)  Post-op Assessment: Post -op Vital signs reviewed and stable  Post vital signs: Reviewed and stable  Complications: No apparent anesthesia complications

## 2012-10-08 NOTE — Progress Notes (Signed)
  Echocardiogram Echocardiogram Transesophageal has been performed.  Georgian Co 10/08/2012, 8:57 AM

## 2012-10-08 NOTE — Addendum Note (Signed)
Addendum created 10/08/12 1756 by Sheppard Evens, CRNA   Modules edited: Anesthesia Events

## 2012-10-08 NOTE — Procedures (Signed)
Extubation Procedure Note  Patient Details:   Name: [name] DOB: 10-15-1950 MRN: [id number]   Airway Documentation:     Evaluation  O2 sats: stable throughout Complications: No apparent complications Patient did tolerate procedure well. Bilateral Breath Sounds: Diminished   Yes  Nakul Avino J 10/08/2012, 11:52 PM

## 2012-10-08 NOTE — Progress Notes (Signed)
2 resp. There. Has tried to get ABG's and has been unsuccessful the 3rd one will try in a.m. Pre op

## 2012-10-08 NOTE — Progress Notes (Signed)
Patient ID: [name], male   DOB: 1950-07-20, 62 y.o.   MRN: [id number] EVENING ROUNDS NOTE :     [address]       [name] [address]             [phone]                 Day of Surgery Procedure(s) (LRB): CORONARY ARTERY BYPASS GRAFTING (CABG) (N/A) AORTIC VALVE REPLACEMENT (AVR) (N/A) MAZE (N/A) INTRAOPERATIVE TRANSESOPHAGEAL ECHOCARDIOGRAM (N/A) ENDOVEIN HARVEST OF GREATER SAPHENOUS VEIN (Bilateral)  Total Length of Stay:  LOS: 22 days  BP 129/77  Pulse 92  Temp(Src) 96.6 F (35.9 C) (Core (Comment))  Resp 12  Ht 5\' 10"  (1.778 m)  Wt 186 lb 14.4 oz (84.777 kg)  BMI 26.82 kg/m2  SpO2 97%  .Intake/Output     06/17 0701 - 06/18 0700   P.O.    I.V. (mL/kg) 2300 (27.1)   Blood 960   IV Piggyback 500   Total Intake(mL/kg) 3760 (44.4)   Urine (mL/kg/hr) 1400 (1.4)   Blood 1340 (1.3)   Chest Tube 20 (0)   Total Output 2760   Net +1000         . sodium chloride 20 mL/hr (10/08/12 1615)  . sodium chloride 20 mL/hr (10/08/12 1615)  . [START ON 10/09/2012] sodium chloride    . dexmedetomidine 0.3 mcg/kg/hr (10/08/12 1615)  . DOPamine 2 mcg/kg/min (10/08/12 1615)  . insulin (NOVOLIN-R) infusion 1.5 Units/hr (10/08/12 1630)  . lactated ringers 20 mL/hr (10/08/12 1615)  . milrinone 0.3 mcg/kg/min (10/08/12 1615)  . nitroGLYCERIN 10 mcg/min (10/08/12 1615)  . phenylephrine (NEO-SYNEPHRINE) Adult infusion 10 mcg/min (10/08/12 1615)     Lab Results  Component Value Date   WBC 10.6* 10/08/2012   HGB 11.9* 10/08/2012   HCT 35.0* 10/08/2012   PLT 161 10/08/2012   GLUCOSE 97 10/08/2012   CHOL 145 09/17/2012   TRIG 77 09/17/2012   HDL 47 09/17/2012   LDLCALC 83 09/17/2012   ALT 18 09/16/2012   AST 30 09/16/2012   NA 138 10/08/2012   K 4.5 10/08/2012   CL 101 10/08/2012   CREATININE 1.00 10/08/2012   BUN 19 10/08/2012   CO2 28 10/08/2012   TSH 1.994 09/17/2012   INR 1.29 10/08/2012   HGBA1C 5.2 10/07/2012   Early postop, not bleeding Starting wake up,    Delight Ovens MD  Beeper [phone] Office [phone] 10/08/2012 7:08 PM

## 2012-10-08 NOTE — Anesthesia Preprocedure Evaluation (Addendum)
Anesthesia Evaluation  Patient identified by MRN, date of birth, ID band Patient awake    Reviewed: Allergy & Precautions, H&P , NPO status , Patient's Chart, lab work & pertinent test results, reviewed documented beta blocker date and time   History of Anesthesia Complications Negative for: history of anesthetic complications  Airway Mallampati: II TM Distance: >3 FB Neck ROM: Full    Dental  (+) Edentulous Upper,  2 lower teeth:   Pulmonary COPD COPD inhaler, Current Smoker,  + rhonchi   + wheezing (expiratory wheeze)      Cardiovascular hypertension, Pt. on medications and Pt. on home beta blockers + CAD, + Peripheral Vascular Disease and +CHF + dysrhythmias Atrial Fibrillation + Valvular Problems/Murmurs AS Rhythm:Irregular Rate:Tachycardia  Chronic peripheral edema   Neuro/Psych    GI/Hepatic (+)       alcohol use,   Endo/Other    Renal/GU      Musculoskeletal   Abdominal (+) + obese,   Peds  Hematology   Anesthesia Other Findings gout  Reproductive/Obstetrics                         Anesthesia Physical Anesthesia Plan  ASA: IV  Anesthesia Plan: General   Post-op Pain Management:    Induction: Intravenous  Airway Management Planned: Oral ETT  Additional Equipment: Arterial line, PA Cath and 3D TEE  Intra-op Plan:   Post-operative Plan: Post-operative intubation/ventilation  Informed Consent: I have reviewed the patients History and Physical, chart, labs and discussed the procedure including the risks, benefits and alternatives for the proposed anesthesia with the patient or authorized representative who has indicated his/her understanding and acceptance.     Plan Discussed with: CRNA, Surgeon and Anesthesiologist  Anesthesia Plan Comments:        Anesthesia Quick Evaluation

## 2012-10-08 NOTE — Progress Notes (Signed)
Begin weaning patient per rapid wean protocol.

## 2012-10-08 NOTE — Anesthesia Procedure Notes (Addendum)
Procedure Name: Intubation Date/Time: 10/08/2012 7:44 AM Performed by: Arlice Colt B Pre-anesthesia Checklist: Patient identified, Timeout performed, Emergency Drugs available, Suction available and Patient being monitored Patient Re-evaluated:Patient Re-evaluated prior to inductionOxygen Delivery Method: Circle system utilized Preoxygenation: Pre-oxygenation with 100% oxygen Intubation Type: IV induction Ventilation: Mask ventilation without difficulty Laryngoscope Size: Mac and 4 Grade View: Grade I Tube type: Oral Number of attempts: 1 Airway Equipment and Method: Stylet Placement Confirmation: ETT inserted through vocal cords under direct vision,  positive ETCO2,  CO2 detector and breath sounds checked- equal and bilateral Secured at: 23 cm Tube secured with: Tape Dental Injury: Teeth and Oropharynx as per pre-operative assessment

## 2012-10-08 NOTE — Brief Op Note (Addendum)
      [address]       [name] [address]             [phone]     09/16/2012 - 10/08/2012  1:20 PM  PATIENT:  [name]  62 y.o. male  PRE-OPERATIVE DIAGNOSIS:  cad,  AS, Atrial fib  POST-OPERATIVE DIAGNOSIS:  cad,  AS, Atrial fib  PROCEDURE:  Procedure(s): CORONARY ARTERY BYPASS GRAFTING (CABG)X 2 SEQ SVG- RCA-PDA AORTIC VALVE REPLACEMENT (AVR) # 23 MAGNAEASE PERICARDIAL Right & Left MAZE/ LIGATION LAA INTRAOPERATIVE TRANSESOPHAGEAL ECHOCARDIOGRAM ENDOVEIN HARVEST OF GREATER SAPHENOUS VEIN BIL THIGH  SURGEON:  Surgeon(s): Delight Ovens, MD  PHYSICIAN ASSISTANT: WAYNE GOLD PA-C  ANESTHESIA:   general  PATIENT CONDITION:  ICU - intubated and hemodynamically stable.  PRE-OPERATIVE WEIGHT: 84.7kg  COMPLICATIONS: NO KNOWN

## 2012-10-08 NOTE — Anesthesia Postprocedure Evaluation (Signed)
  Anesthesia Post-op Note  Patient: [name]  Procedure(s) Performed: Procedure(s) with comments: CORONARY ARTERY BYPASS GRAFTING (CABG) (N/A) - Coronary Artery bypass Graft times two utilizing the left greater saphenous vein harvested endoscopically AORTIC VALVE REPLACEMENT (AVR) (N/A) MAZE (N/A) INTRAOPERATIVE TRANSESOPHAGEAL ECHOCARDIOGRAM (N/A) ENDOVEIN HARVEST OF GREATER SAPHENOUS VEIN (Bilateral)  Patient Location: ICU  Anesthesia Type:General  Level of Consciousness: Patient remains intubated per anesthesia plan  Airway and Oxygen Therapy: Patient remains intubated per anesthesia plan and Patient placed on Ventilator (see vital sign flow sheet for setting)  Post-op Pain: none  Post-op Assessment: Post-op Vital signs reviewed, Patient's Cardiovascular Status Stable, Respiratory Function Stable, Patent Airway, No signs of Nausea or vomiting and Pain level controlled  Post-op Vital Signs: Reviewed and stable  Complications: No apparent anesthesia complications

## 2012-10-09 ENCOUNTER — Inpatient Hospital Stay (HOSPITAL_COMMUNITY): Payer: Medicaid Other

## 2012-10-09 LAB — CBC
HCT: 31.7 % — ABNORMAL LOW (ref 39.0–52.0)
HCT: 32.1 % — ABNORMAL LOW (ref 39.0–52.0)
Hemoglobin: 10.5 g/dL — ABNORMAL LOW (ref 13.0–17.0)
MCH: 29.6 pg (ref 26.0–34.0)
MCH: 28.8 pg (ref 26.0–34.0)
MCHC: 33.1 g/dL (ref 30.0–36.0)
MCHC: 32.1 g/dL (ref 30.0–36.0)
MCV: 89.7 fL (ref 78.0–100.0)
Platelets: 144 10*3/uL — ABNORMAL LOW (ref 150–400)
RDW: 14.9 % (ref 11.5–15.5)
WBC: 8.9 10*3/uL (ref 4.0–10.5)

## 2012-10-09 LAB — GLUCOSE, CAPILLARY
Glucose-Capillary: 111 mg/dL — ABNORMAL HIGH (ref 70–99)
Glucose-Capillary: 126 mg/dL — ABNORMAL HIGH (ref 70–99)
Glucose-Capillary: 128 mg/dL — ABNORMAL HIGH (ref 70–99)
Glucose-Capillary: 129 mg/dL — ABNORMAL HIGH (ref 70–99)
Glucose-Capillary: 110 mg/dL — ABNORMAL HIGH (ref 70–99)
Glucose-Capillary: 122 mg/dL — ABNORMAL HIGH (ref 70–99)
Glucose-Capillary: 103 mg/dL — ABNORMAL HIGH (ref 70–99)
Glucose-Capillary: 118 mg/dL — ABNORMAL HIGH (ref 70–99)

## 2012-10-09 LAB — POCT I-STAT, CHEM 8
Chloride: 104 mEq/L (ref 96–112)
HCT: 31 % — ABNORMAL LOW (ref 39.0–52.0)
Hemoglobin: 10.5 g/dL — ABNORMAL LOW (ref 13.0–17.0)
Potassium: 4.4 mEq/L (ref 3.5–5.1)
Sodium: 135 mEq/L (ref 135–145)

## 2012-10-09 LAB — POCT I-STAT 3, ART BLOOD GAS (G3+)
Bicarbonate: 22 mEq/L (ref 20.0–24.0)
O2 Saturation: 92 %
TCO2: 23 mmol/L (ref 0–100)
pCO2 arterial: 38.9 mmHg (ref 35.0–45.0)

## 2012-10-09 LAB — BASIC METABOLIC PANEL
BUN: 17 mg/dL (ref 6–23)
Calcium: 8.5 mg/dL (ref 8.4–10.5)
GFR calc non Af Amer: >90 mL/min (ref >90)
Glucose, Bld: 134 mg/dL — ABNORMAL HIGH (ref 70–99)

## 2012-10-09 MED ORDER — FUROSEMIDE 10 MG/ML IJ SOLN
40.0000 mg | Freq: Once | INTRAMUSCULAR | Status: AC
  Administered 2012-10-09: 40 mg via INTRAVENOUS
  Filled 2012-10-09: qty 4

## 2012-10-09 MED ORDER — INSULIN DETEMIR 100 UNIT/ML ~~LOC~~ SOLN
10.0000 [IU] | Freq: Every day | SUBCUTANEOUS | Status: DC
  Administered 2012-10-09 – 2012-10-10 (×2): 10 [IU] via SUBCUTANEOUS
  Filled 2012-10-09 (×3): qty 0.1

## 2012-10-09 MED ORDER — INSULIN ASPART 100 UNIT/ML ~~LOC~~ SOLN
0.0000 [IU] | SUBCUTANEOUS | Status: DC
  Administered 2012-10-09 – 2012-10-10 (×3): 2 [IU] via SUBCUTANEOUS

## 2012-10-09 MED ORDER — DIGOXIN 125 MCG PO TABS
0.1250 mg | ORAL_TABLET | Freq: Every day | ORAL | Status: DC
  Administered 2012-10-09 – 2012-10-18 (×10): 0.125 mg via ORAL
  Filled 2012-10-09 (×10): qty 1

## 2012-10-09 MED FILL — Sodium Bicarbonate IV Soln 8.4%: INTRAVENOUS | Qty: 50 | Status: AC

## 2012-10-09 MED FILL — Magnesium Sulfate Inj 50%: INTRAMUSCULAR | Qty: 10 | Status: AC

## 2012-10-09 MED FILL — Potassium Chloride Inj 2 mEq/ML: INTRAVENOUS | Qty: 40 | Status: AC

## 2012-10-09 MED FILL — Lidocaine HCl IV Inj 20 MG/ML: INTRAVENOUS | Qty: 5 | Status: AC

## 2012-10-09 MED FILL — Sodium Chloride Irrigation Soln 0.9%: Qty: 3000 | Status: AC

## 2012-10-09 MED FILL — Heparin Sodium (Porcine) Inj 1000 Unit/ML: INTRAMUSCULAR | Qty: 10 | Status: AC

## 2012-10-09 MED FILL — Heparin Sodium (Porcine) Inj 1000 Unit/ML: INTRAMUSCULAR | Qty: 30 | Status: AC

## 2012-10-09 MED FILL — Mannitol IV Soln 20%: INTRAVENOUS | Qty: 500 | Status: AC

## 2012-10-09 MED FILL — Electrolyte-R (PH 7.4) Solution: INTRAVENOUS | Qty: 4000 | Status: AC

## 2012-10-09 MED FILL — Sodium Chloride IV Soln 0.9%: INTRAVENOUS | Qty: 1000 | Status: AC

## 2012-10-09 NOTE — Progress Notes (Signed)
[facility]  Subjective: Endorsing incisional chest pain, relieved with pain meds. No other complaints.   Objective: Vital signs in last 24 hours: Temp:  [96.4 F (35.8 C)-98.6 F (37 C)] 98.6 F (37 C) (06/18 0930) Pulse Rate:  [80-103] 100 (06/18 0930) Resp:  [10-26] 24 (06/18 0930) BP: (86-129)/(29-77) 119/55 mmHg (06/18 0900) SpO2:  [94 %-100 %] 100 % (06/18 0930) Arterial Line BP: (109-129)/(52-59) 120/52 mmHg (06/18 0930) FiO2 (%):  [40 %-50 %] 40 % (06/17 2253) Weight:  [202 lb 13.2 oz (92 kg)] 202 lb 13.2 oz (92 kg) (06/18 0600) Last BM Date: 10/06/12  Intake/Output from previous day: 06/17 0701 - 06/18 0700 In: 6377.9 [P.O.:240; I.V.:3667.9; Blood:960; NG/GT:60; IV Piggyback:1450] Out: 4415 [Urine:2725; Emesis/NG output:150; Blood:1340; Chest Tube:200] Intake/Output this shift: Total I/O In: 60 [P.O.:60] Out: 180 [Urine:100; Chest Tube:80]  Medications Current Facility-Administered Medications  Medication Dose Route Frequency Provider Last Rate Last Dose  . 0.45 % sodium chloride infusion   Intravenous Continuous Wayne E Gold, PA-C 20 mL/hr at 10/09/12 0900    . 0.9 %  sodium chloride infusion   Intravenous Continuous Rowe Clack, PA-C 20 mL/hr at 10/08/12 1615 20 mL/hr at 10/08/12 1615  . 0.9 %  sodium chloride infusion  250 mL Intravenous Continuous Wayne E Gold, PA-C      . acetaminophen (TYLENOL) tablet 1,000 mg  1,000 mg Oral Q6H Wayne E Gold, PA-C   1,000 mg at 10/09/12 0606   Or  . acetaminophen (TYLENOL) solution 975 mg  975 mg Per Tube Q6H Wayne E Gold, PA-C      . albumin human 5 % solution 250 mL  250 mL Intravenous Q15 min PRN Rowe Clack, PA-C   250 mL at 10/08/12 1938  . aspirin EC tablet 325 mg  325 mg Oral Daily Wayne E Gold, PA-C       Or  . aspirin chewable tablet 324 mg  324 mg Per Tube Daily Wayne E Gold, PA-C      . atorvastatin (LIPITOR) tablet 40 mg  40 mg Oral q1800 Wayne E Gold, PA-C      . bisacodyl  (DULCOLAX) EC tablet 10 mg  10 mg Oral Daily Wayne E Gold, PA-C       Or  . bisacodyl (DULCOLAX) suppository 10 mg  10 mg Rectal Daily Wayne E Gold, PA-C      . cefUROXime (ZINACEF) 1.5 g in dextrose 5 % 50 mL IVPB  1.5 g Intravenous Q12H Wayne E Gold, PA-C   1.5 g at 10/09/12 0606  . dexmedetomidine (PRECEDEX) 200 MCG/50ML infusion  0.1-0.7 mcg/kg/hr Intravenous Continuous Rowe Clack, PA-C   0.1 mcg/kg/hr at 10/08/12 1938  . digoxin (LANOXIN) tablet 0.125 mg  0.125 mg Oral Daily Delight Ovens, MD      . docusate sodium (COLACE) capsule 200 mg  200 mg Oral Daily Rowe Clack, PA-C      . DOPamine (INTROPIN) 800 mg in dextrose 5 % 250 mL infusion  0-10 mcg/kg/min Intravenous Continuous Wayne E Gold, PA-C 3.2 mL/hr at 10/09/12 0900 2 mcg/kg/min at 10/09/12 0900  . famotidine (PEPCID) IVPB 20 mg  20 mg Intravenous Q12H Wayne E Gold, PA-C   20 mg at 10/08/12 1615  . insulin aspart (novoLOG) injection 0-24 Units  0-24 Units Subcutaneous Q4H Delight Ovens, MD      . insulin detemir (LEVEMIR) injection 10 Units  10 Units Subcutaneous Q1200 Delight Ovens,  MD      . insulin regular (NOVOLIN R,HUMULIN R) 1 Units/mL in sodium chloride 0.9 % 100 mL infusion   Intravenous Continuous Wayne E Gold, PA-C 1.3 mL/hr at 10/09/12 0650 1.3 Units/hr at 10/09/12 0650  . insulin regular bolus via infusion 0-10 Units  0-10 Units Intravenous TID WC Wayne E Gold, PA-C      . lactated ringers infusion   Intravenous Continuous Wayne E Gold, PA-C 20 mL/hr at 10/09/12 0900    . levalbuterol (XOPENEX) nebulizer solution 0.63 mg  0.63 mg Nebulization Q8H Delight Ovens, MD   0.63 mg at 10/09/12 0630  . metoprolol (LOPRESSOR) injection 2.5-5 mg  2.5-5 mg Intravenous Q2H PRN Wayne E Gold, PA-C      . metoprolol tartrate (LOPRESSOR) tablet 12.5 mg  12.5 mg Oral BID Wayne E Gold, PA-C       Or  . metoprolol tartrate (LOPRESSOR) 25 mg/10 mL oral suspension 12.5 mg  12.5 mg Per Tube BID Wayne E Gold, PA-C      .  midazolam (VERSED) injection 2 mg  2 mg Intravenous Q1H PRN Wayne E Gold, PA-C      . milrinone (PRIMACOR) infusion 200 mcg/mL (0.2 mg/ml)  0.3 mcg/kg/min Intravenous Continuous Delight Ovens, MD 5.1 mL/hr at 10/09/12 0900 0.2 mcg/kg/min at 10/09/12 0900  . morphine 2 MG/ML injection 2-5 mg  2-5 mg Intravenous Q1H PRN Rowe Clack, PA-C   2 mg at 10/09/12 0748  . nitroGLYCERIN 0.2 mg/mL in dextrose 5 % infusion  0-100 mcg/min Intravenous Continuous Rowe Clack, PA-C   10 mcg/min at 10/08/12 1615  . ondansetron (ZOFRAN) injection 4 mg  4 mg Intravenous Q6H PRN Wayne E Gold, PA-C      . oxyCODONE (Oxy IR/ROXICODONE) immediate release tablet 5-10 mg  5-10 mg Oral Q3H PRN Rowe Clack, PA-C      . [START ON 10/10/2012] pantoprazole (PROTONIX) EC tablet 40 mg  40 mg Oral Daily Wayne E Gold, PA-C      . phenylephrine (NEO-SYNEPHRINE) 20,000 mcg in dextrose 5 % 250 mL infusion  0-100 mcg/min Intravenous Continuous Wayne E Gold, PA-C 16.5 mL/hr at 10/09/12 0900 22 mcg/min at 10/09/12 0900  . sodium chloride 0.9 % injection 3 mL  3 mL Intravenous Q12H Wayne E Gold, PA-C      . sodium chloride 0.9 % injection 3 mL  3 mL Intravenous PRN Rowe Clack, PA-C        PE: General appearance: alert, cooperative and no distress No JVD Lungs: decrased BS at the bases Heart: regular rate and rhythm 1/6 sem; no rub Extremities: 2+ bilateral pedal edema Pulses: 2+ and symmetric Skin: warm and dry Neurologic: Grossly normal  Lab Results:   Recent Labs  10/08/12 1600  10/08/12 2225 10/08/12 2230 10/09/12 0430  WBC 10.6*  --   --  7.2 9.2  HGB 11.6*  < > 10.2* 10.5* 10.5*  HCT 35.3*  < > 30.0* 31.4* 31.7*  PLT 161  --   --  137* 165  < > = values in this interval not displayed. BMET  Recent Labs  10/07/12 0503 10/08/12 0420  10/08/12 1607 10/08/12 2225 10/08/12 2230 10/09/12 0430  NA 136 137  < > 138 136  --  134*  K 4.4 4.0  < > 4.5 4.5  --  4.4  CL 99 101  --   --  106  --  104  CO2 29 28   --   --   --   --  23  GLUCOSE 122* 111*  < > 97 144*  --  134*  BUN 23 19  --   --  15  --  17  CREATININE 1.09 1.00  --   --  0.80 0.82 0.80  CALCIUM 9.2 9.3  --   --   --   --  8.5  < > = values in this interval not displayed. PT/INR  Recent Labs  10/07/12 2255 10/08/12 1600  LABPROT 12.9 15.8*  INR 0.98 1.29    Assessment/Plan    Principal Problem:   Acute respiratory failure, with BiPAP needed on admit Active Problems:   PAF (paroxysmal atrial fibrillation), he has had periods of NSR since admission   HTN (hypertension)   Hyperlipidemia   Noncompliance, no meds for two weeks prior to admission   Tobacco use   Acute combined systolic and diastolic CHF, NYHA class 3 -- previous normal LV function and moderate AS- re-evaluate; EF now 30-35% with regional WMA   Bilateral lower extremity edema, secondary to acute CHF   Aortic stenosis, severe   CAD, RCA/PDA, 30-40% LM at cath 09/20/12   Cellulitis, lower extremity- treated   PAD (peripheral artery disease), decreased bil. ABIs   Chronic obstructive pulmonary disease   Cardiomyopathy, ischemic- 30-35% 2D    Gout flare. 09/29/12, Lt knee, improved with colchicine.  Plan: S/P aortic valve replacement with pericardial tissue valve, coronary artery bypass grafting x 2 with sequential reverse saphenous vein graft to the posterior descending and posterolateral branches of the right coronary artery and right/ left radiofrequency ablation MAZE. Post-op day 1. Pt is recovering well in ICU. He is hemodynamically stable and maintaining NSR. Continue current plan of care per TCTS. Will continue to follow.    LOS: 23 days    Brittainy M. Delmer Islam 10/09/2012 10:07 AM   Patient seen and examined. Agree with assessment and plan. Day 1 s/p AVR with pericardial valve, CABG x 2 and bilateral MAZE procedure. Now off all pressors; stable hemodynamics. ECG with inferolateral T wave inversion.   Lennette Bihari, MD, [facility] 10/09/2012 12:55  PM

## 2012-10-09 NOTE — Op Note (Signed)
[name], [name] NO.:  [id number]  MEDICAL RECORD NO.:  [id number]  LOCATION:  2307                         FACILITY:  [facility]  PHYSICIAN:  Sheliah Plane, MD    DATE OF BIRTH:  12-18-1950  DATE OF PROCEDURE:  10/08/2012 DATE OF DISCHARGE:                              OPERATIVE REPORT   PREOPERATIVE DIAGNOSES:  Critical aortic stenosis, recent inferior myocardial infarction with class 4 heart failure, severe chronic obstructive pulmonary disease, atrial fibrillation, and coronary occlusive disease.  POSTOPERATIVE DIAGNOSES:  Critical aortic stenosis, recent inferior myocardial infarction with class 4 heart failure, severe chronic obstructive pulmonary disease, atrial fibrillation, and coronary occlusive disease.  PROCEDURES:  Aortic valve replacement with pericardial tissue valve, [employer], model 3300TFX 23 mm, serial [id number].  Coronary artery bypass grafting x2 with sequential reverse saphenous vein graft to the posterior descending and posterolateral branches of the right coronary artery.  Right and left radiofrequency ablation MAZE, placement of AtriCure left atrial clip with endovein harvesting both sides.  SURGEON:  Sheliah Plane, MD  FIRST ASSISTANT:  Gershon Crane, [place]  BRIEF HISTORY:  The patient is a 62 year old male who presented several weeks prior to surgery in severe congestive heart failure with positive troponins, anasarca to the mid chest, in respiratory distress.  He was also noted to be in atrial fibrillation.  He was stabilized medically. Further evaluation showed critical right coronary artery disease involving the mid and distal right and takeoff of the posterior descending.  In addition, he was in atrial fibrillation, had severe COPD with FEV1 of 500.  Echocardiogram revealed severe aortic stenosis with a trileaflet valve.  The patient was kept under intensive care and management, diuresed.  On initial presentation, he  also had significant cellulitis involving both lower extremities.  Over several weeks, his overall situation improved to the point of considering surgery.  The risks and options were discussed with the patient in detail.  He was aware of the increased risk of surgical procedure because of his poor medical condition and COPD.  The patient agreed and signed informed consent.  DESCRIPTION OF PROCEDURE:  With Swan-Ganz and arterial line monitors in place, the patient underwent general endotracheal anesthesia. Transesophageal echo probe was placed.  The patient was in atrial fibrillation.  Echo findings are dictated under separate TEE note by Dr. Gypsy Balsam, but was consistent with critical aortic stenosis, depressed LV function, especially the inferior wall.  Appropriate time-out was performed.  The patient's chest and legs were prepped with Betadine and draped in usual sterile manner.  Initially, vein was harvested from the right thigh, but after short segment became too small to be used, additional vein was harvested from the left thigh with the [employer] endovein harvesting system and was of good quality. The left internal mammary artery was not used and not indicated because of lack of LAD disease.  The right internal mammary was not used as the length would not be sufficient with enlarged heart and needing grafts to both the posterior descending and posterolateral branches.  Median sternotomy was performed.  Pericardium was opened.  The patient was systemically heparinized.  The ascending aorta was cannulated.  Superior and inferior vena cannulas  were placed.  Caval tapes were placed.  Retrograde cardioplegia catheter was placed into the coronary sinus.  The patient was placed on cardiopulmonary bypass 2.4 liters/minute/meter squared. Sites of anastomosis were selected and dissected out the epicardium. The patient's body temperature was cooled to 28 degrees.  Aortic root vent cardioplegia  needle was introduced into the ascending aorta.  With the patient's body temperature cooled, aortic crossclamp was applied and 800 mL of cold blood potassium cardioplegia was administered antegrade. Additional cold blood cardioplegia was administered retrograde. Attention was turned first to the distal coronary anastomoses. Posterior descending was opened, was a good size vessel, 1.8 mm in size. Using a diamond-type side-to-side anastomosis was carried out with a second reverse saphenous vein graft.  Distal extent of the same vein was then carried short distance to the posterolateral branch which was slightly smaller, but also of reasonable quality.  Using a running 7-0 Prolene, distal anastomosis was performed.  Additional cold blood cardioplegia was administered down the vein grafts.  Attention was turned to the MAZE procedure using bipolar radiofrequency Medtronic Cardioblate system.  Lesions were placed across the base of the left atrial appendage around the left pulmonary veins.  The heart was then elevated.  The interatrial groove was opened and bipolar device was used to complete lesions around the right pulmonary veins.  A box lesions connecting the right and left were then created.  Using the unipolar device, lesions to the atrial appendage, mitral valve were completed.  A right superior pulmonary vein vent was then placed and the left atrium closed with running 3-0 Prolene suture in a double layer. Intermittently, additional cold blood cardioplegia was administered both down the vein grafts and retrograde.  I then turned attention to the aortic valve.  A transverse aortotomy was performed.  This gave good exposure of a trileaflet highly calcified aortic valve.  The valve was excised and anulus debrided, taking care to remove all loose calcific debris.  The annulus was sized for a 23 pericardial tissue valve.  A #2 TiCron sutures, 15 in total were placed circumferentially with  the pledgets on the ventricular surface and the pericardial tissue valve, secured in place and seated well with no impingement on the left or right coronary ostium.  The aortotomy was then closed with a horizontal mattress suture over felt strips.  Aortic root and cardioplegia needle was then removed and a punch aortotomy was created at the site. Proximal vein was trimmed to the appropriate length and anastomosed to the ascending aorta.  The anastomosis was left open for de-airing purposes.  As we rewarmed, the right atrium was then opened and using the bipolar device, right atrial appendage lesions were created, lesions to the superior and inferior caval lesions were created. In addition,with the monopolar device, lesions to the coronary sinus, tricuspid valve, and across the fossa ovalis were also created.  The heart was de-aired through the proximal anastomosis.  Aortic crossclamp was removed. Coronary filling filled the right atrium and the right atrium was de- aired and the atriotomy was closed.  An 18-gauge needle was introduced into the left ventricular apex to further de-air the heart.  The patient's body temperature rewarmed to 37 degrees.  He converted to sinus rhythm, required atrial and ventricular pacing temporarily, but ultimately left the operating room in sinus rhythm without pacing.  With the body temperature rewarmed to 37 degrees, the right superior pulmonary vein vent was removed.  TEE showed good function of the aortic valve and on  low-dose milrinone and dopamine showed improvement of inferior wall contractility.  The patient was then ventilated and weaned from cardiopulmonary bypass, remained hemodynamically stable, was decannulated in usual fashion.  Protamine sulfate was administered with operative field hemostatic, right pleural tube was placed.  A Blake mediastinal drain was placed.  The sternum was closed.  Pericardium was loosely reapproximated.  Sternum was  closed with #6 stainless steel wire.  Fascia was closed with interrupted 0 Vicryl, running 3-0 Vicryl in subcutaneous tissue, and 4-0 subcuticular stitches in the skin edges. Dry dressings were applied.  Sponge and needle count was reported as correct at completion of procedure.  The patient tolerated the procedure without obvious complication and was transferred to the Surgical Intensive Care Unit for further postoperative care.  Total crossclamp time was 177 minutes.  Total pump time 260 minutes.     Sheliah Plane, MD     EG/MEDQ  D:  10/09/2012  T:  10/09/2012  Job:  [id number]  cc:   Nanetta Batty, M.D.

## 2012-10-09 NOTE — Progress Notes (Signed)
Patient ID: [name], male   DOB: [birth year], 62 y.o.   MRN: [id number] EVENING ROUNDS NOTE :     [address]       [place] [address]             [phone]                 1 Day Post-Op Procedure(s) (LRB): CORONARY ARTERY BYPASS GRAFTING (CABG) (N/A) AORTIC VALVE REPLACEMENT (AVR) (N/A) MAZE (N/A) INTRAOPERATIVE TRANSESOPHAGEAL ECHOCARDIOGRAM (N/A) ENDOVEIN HARVEST OF GREATER SAPHENOUS VEIN (Bilateral)  Total Length of Stay:  LOS: 23 days  BP 113/47  Pulse 89  Temp(Src) 99.1 F (37.3 C) (Oral)  Resp 21  Ht 5\' 10"  (1.778 m)  Wt 202 lb 13.2 oz (92 kg)  BMI 29.1 kg/m2  SpO2 97%  .Intake/Output     06/17 0701 - 06/18 0700 06/18 0701 - 06/19 0700   P.O. 240 60   I.V. (mL/kg) 3669.3 (39.9) 4.5 (0)   Blood 960    NG/GT 60    IV Piggyback 1450    Total Intake(mL/kg) 6379.3 (69.3) 64.5 (0.7)   Urine (mL/kg/hr) 2725 (1.2) 505 (0.5)   Emesis/NG output 150 (0.1)    Blood 1340 (0.6)    Chest Tube 200 (0.1) 90 (0.1)   Total Output 4415 595   Net +1964.3 -530.5          . sodium chloride 20 mL/hr at 10/09/12 1000  . sodium chloride 20 mL/hr (10/08/12 1615)  . sodium chloride    . dexmedetomidine Stopped (10/08/12 2000)  . DOPamine Stopped (10/09/12 1400)  . insulin (NOVOLIN-R) infusion 1.3 Units/hr (10/09/12 0650)  . lactated ringers 20 mL/hr at 10/09/12 1700  . nitroGLYCERIN Stopped (10/08/12 1900)  . phenylephrine (NEO-SYNEPHRINE) Adult infusion Stopped (10/09/12 1200)     Lab Results  Component Value Date   WBC 8.9 10/09/2012   HGB 10.3* 10/09/2012   HGB 10.5* 10/09/2012   HCT 32.1* 10/09/2012   HCT 31.0* 10/09/2012   PLT 144* 10/09/2012   GLUCOSE 124* 10/09/2012   CHOL 145 09/17/2012   TRIG 77 09/17/2012   HDL 47 09/17/2012   LDLCALC 83 09/17/2012   ALT 18 09/16/2012   AST 30 09/16/2012   NA 135 10/09/2012   K 4.4 10/09/2012   CL 104 10/09/2012   CREATININE 0.92 10/09/2012   CREATININE 1.00 10/09/2012   BUN 18 10/09/2012   CO2 23 10/09/2012   TSH 1.994 09/17/2012   INR 1.29 10/08/2012   HGBA1C 5.2 10/07/2012   Stable day Walked in unit  holding sinus  Delight Ovens MD  Beeper [phone] Office [phone] 10/09/2012 6:24 PM

## 2012-10-09 NOTE — Op Note (Signed)
NAMEJEMARCUS, Dustin NO.:  [id number]  MEDICAL RECORD NO.:  [id number]  LOCATION:  2307                         FACILITY:  [facility]  PHYSICIAN:  Bedelia Person, M.D.        DATE OF BIRTH:  [birth year]  DATE OF PROCEDURE:  10/08/2012 DATE OF DISCHARGE:                              OPERATIVE REPORT   Transesophageal Echocardiography.  HISTORY:  [name] is a patient who was admitted approximately 3 weeks prior with significant congestive heart failure, thought secondary to single vessel coronary artery disease and severe aortic stenosis, is also in atrial fibrillation.  The patient will be undergoing a Maze procedure, aortic valve replacement, and single-vessel coronary artery bypass grafting.  The TEE will be used intraoperatively to assess the left ventricular function as well as replacement of the aortic valve pre and post bypass.  The patient has no history of esophageal or gastric pathology.  After induction with general anesthesia, the airway was secured with an oral endotracheal tube and an oral gastric tube was placed to remove gastric contents.  The tube was then removed.  The heavily lubricated transesophageal transducer was placed down the oropharynx blindly with no significant resistance.  It should be noted that at the completion of the case the probe was removed and there was no evidence of oral or pharyngeal damage.  The prebypass examination revealed the left ventricle to be overall slightly hypertrophied.  There was some inferior septal hypokinesis. Overall contractility was slightly depressed.  Left atrium was enlarged. Thorough exam of the appendage revealed it to be clean.  Interatrial septum was intact on color Doppler.  The right ventricle showed overall decreased contractility and slightly dilated.  Right atrium was also dilated.  The mitral valve had annular calcification.  Leaflets appeared to be clean and moving freely.  Color Doppler  revealed a mild mitral regurgitant jet, which was directed slightly over the anterior leaflet. The aortic valve had 3 leaflets.  There was heavy annular and leaflet calcification.  There was limited opening of all 3 leaflets.  Manometry measured valvular area at 0.66 cm2.  The left ventricular outflow tract appeared normal.  It was measured at 2.3 cm.  Color Doppler revealed no aortic valvular insufficiency.  The tricuspid valve looked normal in appearance and function.  Color Doppler did reveal a mild-to-moderate central regurgitant jet of tricuspid insufficiency.  Swan-Ganz catheter was noted across the valve.  The patient was placed on cardiopulmonary bypass and underwent aortic valve replacement as well as single vessel coronary artery bypass grafting and the Maze procedure.  At the completion of the procedure, there was numerous air bubbles, which were cleared with a left ventricular vent.  The post bypass examination revealed the left ventricle to now show good contractility.  The inferior septal hypokinesis was now absent.  There were no other new segmental wall defects.  Left atrium showed few air bubbles, which again were quickly cleared.  The mitral valve showed no new findings and continued to show just a mild regurgitant flow on color Doppler.  The prosthetic aortic valve appeared to be well seated.  All leaflets were opening and closing appropriately.  There were no  perivalvular leaks noted. There was no significant aortic insufficiency.  The right heart examination now showed a well-contracting right ventricle on mild inotropic dopamine support.  Tricuspid insufficiency was reduced from the prebypass.  There were no other new findings on the examination.          ______________________________ Bedelia Person, M.D.     LK/MEDQ  D:  10/08/2012  T:  10/09/2012  Job:  [id number]

## 2012-10-09 NOTE — Progress Notes (Signed)
Patient ID: [name], male   DOB: July 26, 1950, 62 y.o.   MRN: [id number] [facility]CTS DAILY ICU PROGRESS NOTE                   [address]            [name] [address]          [phone]   1 Day Post-Op Procedure(s) (LRB): CORONARY ARTERY BYPASS GRAFTING (CABG) (N/A) AORTIC VALVE REPLACEMENT (AVR) (N/A) MAZE (N/A) INTRAOPERATIVE TRANSESOPHAGEAL ECHOCARDIOGRAM (N/A) ENDOVEIN HARVEST OF GREATER SAPHENOUS VEIN (Bilateral)  Total Length of Stay:  LOS: 23 days   Subjective: Extubated ,awake and alert  Objective: Vital signs in last 24 hours: Temp:  [96.4 F (35.8 C)-98.4 F (36.9 C)] 98.4 F (36.9 C) (06/18 0700) Pulse Rate:  [80-102] 102 (06/18 0700) Cardiac Rhythm:  [-] Atrial paced;Normal sinus rhythm (06/18 0000) Resp:  [10-23] 21 (06/18 0600) BP: (86-129)/(29-77) 127/44 mmHg (06/18 0700) SpO2:  [94 %-100 %] 98 % (06/18 0700) FiO2 (%):  [40 %-50 %] 40 % (06/17 2253) Weight:  [202 lb 13.2 oz (92 kg)] 202 lb 13.2 oz (92 kg) (06/18 0600)  Filed Weights   10/08/12 0342 10/08/12 0458 10/09/12 0600  Weight: 186 lb 14.4 oz (84.777 kg) 186 lb 14.4 oz (84.777 kg) 202 lb 13.2 oz (92 kg)    Weight change: 15 lb 14.8 oz (7.223 kg)   Hemodynamic parameters for last 24 hours: PAP: (27-51)/(15-32) 40/22 mmHg CO:  [4.6 L/min-6.6 L/min] 6.6 L/min CI:  [2.3 L/min/m2-3.2 L/min/m2] 3.2 L/min/m2  Intake/Output from previous day: 06/17 0701 - 06/18 0700 In: 6377.9 [P.O.:240; I.V.:3667.9; Blood:960; NG/GT:60; IV Piggyback:1450] Out: 4415 [Urine:2725; Emesis/NG output:150; Blood:1340; Chest Tube:200]  Intake/Output this shift:    Current Meds: Scheduled Meds: . acetaminophen  1,000 mg Oral Q6H   Or  . acetaminophen (TYLENOL) oral liquid 160 mg/5 mL  975 mg Per Tube Q6H  . aspirin EC  325 mg Oral Daily   Or  . aspirin  324 mg Per Tube Daily  . atorvastatin  40 mg Oral q1800  . bisacodyl  10 mg Oral Daily   Or  . bisacodyl  10 mg Rectal Daily  . cefUROXime  (ZINACEF)  IV  1.5 g Intravenous Q12H  . docusate sodium  200 mg Oral Daily  . famotidine (PEPCID) IV  20 mg Intravenous Q12H  . insulin regular  0-10 Units Intravenous TID WC  . levalbuterol  0.63 mg Nebulization Q8H  . metoprolol tartrate  12.5 mg Oral BID   Or  . metoprolol tartrate  12.5 mg Per Tube BID  . [START ON 10/10/2012] pantoprazole  40 mg Oral Daily  . sodium chloride  3 mL Intravenous Q12H   Continuous Infusions: . sodium chloride 20 mL/hr at 10/09/12 0700  . sodium chloride 20 mL/hr (10/08/12 1615)  . sodium chloride    . dexmedetomidine Stopped (10/08/12 2000)  . DOPamine 2 mcg/kg/min (10/09/12 0700)  . insulin (NOVOLIN-R) infusion 1.3 Units/hr (10/09/12 0650)  . lactated ringers 20 mL/hr at 10/09/12 0700  . milrinone 0.3 mcg/kg/min (10/09/12 0700)  . nitroGLYCERIN Stopped (10/08/12 1900)  . phenylephrine (NEO-SYNEPHRINE) Adult infusion 30 mcg/min (10/09/12 0700)   PRN Meds:.albumin human, metoprolol, midazolam, morphine injection, ondansetron (ZOFRAN) IV, oxyCODONE, sodium chloride  General appearance: alert, cooperative and no distress Neurologic: intact Heart: regular rate and rhythm, S1, S2 normal, no murmur, click, rub or gallop Lungs: diminished breath sounds bibasilar Abdomen: soft, non-tender; bowel sounds normal;  no masses,  no organomegaly Extremities: edema at ankles Wound: sternum stable  Lab Results: CBC: Recent Labs  10/08/12 2230 10/09/12 0430  WBC 7.2 9.2  HGB 10.5* 10.5*  HCT 31.4* 31.7*  PLT 137* 165   BMET:  Recent Labs  10/08/12 0420  10/08/12 2225 10/08/12 2230 10/09/12 0430  NA 137  < > 136  --  134*  K 4.0  < > 4.5  --  4.4  CL 101  --  106  --  104  CO2 28  --   --   --  23  GLUCOSE 111*  < > 144*  --  134*  BUN 19  --  15  --  17  CREATININE 1.00  --  0.80 0.82 0.80  CALCIUM 9.3  --   --   --  8.5  < > = values in this interval not displayed.  PT/INR:  Recent Labs  10/08/12 1600  LABPROT 15.8*  INR 1.29    Radiology: Dg Chest 2 View  10/07/2012   *RADIOLOGY REPORT*  Clinical Data: 62 year old male preoperative study for heart surgery.  CHEST - 2 VIEW  Comparison: 09/25/2012 and earlier.  Findings: Decreased / resolved pleural effusions seen recently and on 09/23/2012.  Improved bibasilar ventilation. Minor left lung atelectasis.  Stable cardiac size and mediastinal contours.  No pneumothorax.  No consolidation.  No residual interstitial edema. Visualized tracheal air column is within normal limits.  No acute osseous abnormality identified.  IMPRESSION: Resolved interstitial edema and pleural effusions since 09/25/2012. No new cardiopulmonary abnormality peri   Original Report Authenticated By: Erskine Speed, M.D.   Dg Chest Portable 1 View  10/08/2012   *RADIOLOGY REPORT*  Clinical Data: Status post cardiac surgery  PORTABLE CHEST - 1 VIEW  Comparison: 10/07/2012  Findings: The cardiac shadow is stable.  The endotracheal tube and nasogastric catheter are seen in satisfactory position.  The Theone Murdoch catheter is noted in the right pulmonary artery.  A left-sided thoracostomy catheter and mediastinal drain are noted.  The left atrial clip is seen. The lungs are clear.  No pneumothorax is noted.  IMPRESSION: Postsurgical changes with tubes and lines as described.  No acute abnormality is noted.   Original Report Authenticated By: Alcide Clever, M.D.     Assessment/Plan: S/P Procedure(s) (LRB): CORONARY ARTERY BYPASS GRAFTING (CABG) (N/A) AORTIC VALVE REPLACEMENT (AVR) (N/A) MAZE (N/A) INTRAOPERATIVE TRANSESOPHAGEAL ECHOCARDIOGRAM (N/A) ENDOVEIN HARVEST OF GREATER SAPHENOUS VEIN (Bilateral) Mobilize Diuresis Diabetes control d/c tubes/lines Continue foley due to strict I&O, patient in ICU and urinary output monitoring See progression orders Expected Acute  Blood - loss Anemia    [name] B 10/09/2012 7:42 AM

## 2012-10-10 ENCOUNTER — Inpatient Hospital Stay (HOSPITAL_COMMUNITY): Payer: Medicaid Other

## 2012-10-10 ENCOUNTER — Encounter (HOSPITAL_COMMUNITY): Payer: Self-pay | Admitting: Cardiothoracic Surgery

## 2012-10-10 LAB — GLUCOSE, CAPILLARY
Glucose-Capillary: 123 mg/dL — ABNORMAL HIGH (ref 70–99)
Glucose-Capillary: 126 mg/dL — ABNORMAL HIGH (ref 70–99)
Glucose-Capillary: 83 mg/dL (ref 70–99)
Glucose-Capillary: 86 mg/dL (ref 70–99)
Glucose-Capillary: 99 mg/dL (ref 70–99)

## 2012-10-10 LAB — BASIC METABOLIC PANEL
BUN: 18 mg/dL (ref 6–23)
CO2: 27 mEq/L (ref 19–32)
Calcium: 8.7 mg/dL (ref 8.4–10.5)
Chloride: 103 mEq/L (ref 96–112)
Creatinine, Ser: 0.87 mg/dL (ref 0.50–1.35)
GFR calc Af Amer: >90 mL/min (ref >90)
GFR calc non Af Amer: 90 mL/min — ABNORMAL LOW (ref >90)
Glucose, Bld: 90 mg/dL (ref 70–99)
Potassium: 4.2 mEq/L (ref 3.5–5.1)
Sodium: 137 mEq/L (ref 135–145)

## 2012-10-10 LAB — CBC
HCT: 29.3 % — ABNORMAL LOW (ref 39.0–52.0)
Hemoglobin: 9.6 g/dL — ABNORMAL LOW (ref 13.0–17.0)
MCH: 29.8 pg (ref 26.0–34.0)
MCHC: 32.8 g/dL (ref 30.0–36.0)
MCV: 91 fL (ref 78.0–100.0)
Platelets: 133 10*3/uL — ABNORMAL LOW (ref 150–400)
RBC: 3.22 MIL/uL — ABNORMAL LOW (ref 4.22–5.81)
RDW: 15.2 % (ref 11.5–15.5)
WBC: 8.6 10*3/uL (ref 4.0–10.5)

## 2012-10-10 MED ORDER — ASPIRIN EC 81 MG PO TBEC
81.0000 mg | DELAYED_RELEASE_TABLET | Freq: Every day | ORAL | Status: DC
  Filled 2012-10-10: qty 1

## 2012-10-10 MED ORDER — ASPIRIN 81 MG PO CHEW: 81.0000 mg | CHEWABLE_TABLET | Freq: Every day | ORAL | Status: DC

## 2012-10-10 MED ORDER — WARFARIN SODIUM 2.5 MG PO TABS
2.5000 mg | ORAL_TABLET | Freq: Once | ORAL | Status: AC
  Administered 2012-10-10: 2.5 mg via ORAL
  Filled 2012-10-10: qty 1

## 2012-10-10 MED ORDER — WARFARIN - PHYSICIAN DOSING INPATIENT
Freq: Every day | Status: DC
  Administered 2012-10-11 – 2012-10-17 (×2)

## 2012-10-10 MED ORDER — FUROSEMIDE 10 MG/ML IJ SOLN
40.0000 mg | Freq: Once | INTRAMUSCULAR | Status: AC
  Administered 2012-10-10: 40 mg via INTRAVENOUS
  Filled 2012-10-10: qty 4

## 2012-10-10 NOTE — Progress Notes (Signed)
[facility]  Subjective: No complaints. Ambulating w/o difficulty. No chest pain or SOB.  Objective: Vital signs in last 24 hours: Temp:  [98.2 F (36.8 C)-99.1 F (37.3 C)] 98.3 F (36.8 C) (06/19 0802) Pulse Rate:  [86-110] 91 (06/19 1100) Resp:  [20-29] 29 (06/19 1100) BP: (105-144)/(36-107) 114/53 mmHg (06/19 1100) SpO2:  [90 %-99 %] 98 % (06/19 1100) Arterial Line BP: (131-156)/(54-65) 156/65 mmHg (06/18 1400) Weight:  [202 lb 2.6 oz (91.7 kg)] 202 lb 2.6 oz (91.7 kg) (06/19 0600) Last BM Date: 10/06/12  Intake/Output from previous day: 06/18 0701 - 06/19 0700 In: 774.5 [P.O.:190; I.V.:484.5; IV Piggyback:100] Out: 2235 [Urine:1985; Chest Tube:250] Intake/Output this shift: Total I/O In: 230 [P.O.:150; I.V.:80] Out: 895 [Urine:875; Chest Tube:20]  Medications Current Facility-Administered Medications  Medication Dose Route Frequency Provider Last Rate Last Dose  . 0.45 % sodium chloride infusion   Intravenous Continuous Rowe Clack, PA-C 20 mL/hr at 10/09/12 1000    . 0.9 %  sodium chloride infusion   Intravenous Continuous Rowe Clack, PA-C 20 mL/hr at 10/08/12 1615 20 mL/hr at 10/08/12 1615  . 0.9 %  sodium chloride infusion  250 mL Intravenous Continuous Wayne E Gold, PA-C      . acetaminophen (TYLENOL) tablet 1,000 mg  1,000 mg Oral Q6H Wayne E Gold, PA-C   1,000 mg at 10/10/12 3086   Or  . acetaminophen (TYLENOL) solution 975 mg  975 mg Per Tube Q6H Wayne E Gold, PA-C      . aspirin EC tablet 325 mg  325 mg Oral Daily Rowe Clack, PA-C   325 mg at 10/09/12 1032   Or  . aspirin chewable tablet 324 mg  324 mg Per Tube Daily Wayne E Gold, PA-C   324 mg at 10/10/12 0942  . atorvastatin (LIPITOR) tablet 40 mg  40 mg Oral q1800 Wayne E Gold, PA-C   40 mg at 10/09/12 1847  . bisacodyl (DULCOLAX) EC tablet 10 mg  10 mg Oral Daily Rowe Clack, PA-C   10 mg at 10/10/12 5784   Or  . bisacodyl (DULCOLAX) suppository 10 mg  10 mg Rectal  Daily Wayne E Gold, PA-C      . dexmedetomidine (PRECEDEX) 200 MCG/50ML infusion  0.1-0.7 mcg/kg/hr Intravenous Continuous Rowe Clack, PA-C   0.1 mcg/kg/hr at 10/08/12 1938  . digoxin (LANOXIN) tablet 0.125 mg  0.125 mg Oral Daily Delight Ovens, MD   0.125 mg at 10/10/12 0947  . docusate sodium (COLACE) capsule 200 mg  200 mg Oral Daily Rowe Clack, PA-C   200 mg at 10/10/12 0947  . DOPamine (INTROPIN) 800 mg in dextrose 5 % 250 mL infusion  0-10 mcg/kg/min Intravenous Continuous Wayne E Gold, PA-C   1 mcg/kg/min at 10/09/12 1300  . insulin aspart (novoLOG) injection 0-24 Units  0-24 Units Subcutaneous Q4H Delight Ovens, MD   2 Units at 10/10/12 0004  . insulin detemir (LEVEMIR) injection 10 Units  10 Units Subcutaneous Q1200 Delight Ovens, MD   10 Units at 10/09/12 1000  . insulin regular (NOVOLIN R,HUMULIN R) 1 Units/mL in sodium chloride 0.9 % 100 mL infusion   Intravenous Continuous Wayne E Gold, PA-C 1.3 mL/hr at 10/09/12 0650 1.3 Units/hr at 10/09/12 0650  . insulin regular bolus via infusion 0-10 Units  0-10 Units Intravenous TID WC Wayne E Gold, PA-C      . lactated ringers infusion   Intravenous Continuous Wayne E  Gold, PA-C 20 mL/hr at 10/10/12 0600    . levalbuterol (XOPENEX) nebulizer solution 0.63 mg  0.63 mg Nebulization Q8H Delight Ovens, MD   0.63 mg at 10/10/12 0514  . metoprolol (LOPRESSOR) injection 2.5-5 mg  2.5-5 mg Intravenous Q2H PRN Wayne E Gold, PA-C      . metoprolol tartrate (LOPRESSOR) tablet 12.5 mg  12.5 mg Oral BID Wayne E Gold, PA-C   12.5 mg at 10/10/12 1006   Or  . metoprolol tartrate (LOPRESSOR) 25 mg/10 mL oral suspension 12.5 mg  12.5 mg Per Tube BID Wayne E Gold, PA-C      . midazolam (VERSED) injection 2 mg  2 mg Intravenous Q1H PRN Wayne E Gold, PA-C      . morphine 2 MG/ML injection 2-5 mg  2-5 mg Intravenous Q1H PRN Wayne E Gold, PA-C   2 mg at 10/09/12 1023  . nitroGLYCERIN 0.2 mg/mL in dextrose 5 % infusion  0-100 mcg/min Intravenous  Continuous Rowe Clack, PA-C   10 mcg/min at 10/08/12 1615  . ondansetron (ZOFRAN) injection 4 mg  4 mg Intravenous Q6H PRN Wayne E Gold, PA-C      . oxyCODONE (Oxy IR/ROXICODONE) immediate release tablet 5-10 mg  5-10 mg Oral Q3H PRN Wayne E Gold, PA-C   5 mg at 10/10/12 0201  . pantoprazole (PROTONIX) EC tablet 40 mg  40 mg Oral Daily Rowe Clack, PA-C   40 mg at 10/10/12 0942  . phenylephrine (NEO-SYNEPHRINE) 20,000 mcg in dextrose 5 % 250 mL infusion  0-100 mcg/min Intravenous Continuous Wayne E Gold, PA-C   5 mcg/min at 10/09/12 1100  . sodium chloride 0.9 % injection 3 mL  3 mL Intravenous Q12H Wayne E Gold, PA-C   3 mL at 10/10/12 0947  . sodium chloride 0.9 % injection 3 mL  3 mL Intravenous PRN Rowe Clack, PA-C   3 mL at 10/09/12 2317    PE: General appearance: alert, cooperative and no distress Lungs: decreased BS at the bases Heart: regular rate and rhythm Extremities: 1+ bilateral pre-tibial edema Pulses: 2+ and symmetric Skin: warm and dry Neurologic: Grossly normal  Lab Results:   Recent Labs  10/09/12 0430 10/09/12 1650 10/10/12 0435  WBC 9.2 8.9 8.6  HGB 10.5* 10.3*  10.5* 9.6*  HCT 31.7* 32.1*  31.0* 29.3*  PLT 165 144* 133*   BMET  Recent Labs  10/08/12 0420  10/09/12 0430 10/09/12 1650 10/10/12 0435  NA 137  < > 134* 135 137  K 4.0  < > 4.4 4.4 4.2  CL 101  < > 104 104 103  CO2 28  --  23  --  27  GLUCOSE 111*  < > 134* 124* 90  BUN 19  < > 17 18 18   CREATININE 1.00  < > 0.80 0.92  1.00 0.87  CALCIUM 9.3  --  8.5  --  8.7  < > = values in this interval not displayed. PT/INR  Recent Labs  10/07/12 2255 10/08/12 1600  LABPROT 12.9 15.8*  INR 0.98 1.29   Assessment/Plan  Principal Problem:   Acute respiratory failure, with BiPAP needed on admit Active Problems:   PAF (paroxysmal atrial fibrillation), he has had periods of NSR since admission   HTN (hypertension)   Hyperlipidemia   Noncompliance, no meds for two weeks prior to  admission   Tobacco use   Acute combined systolic and diastolic CHF, NYHA class 3 -- previous normal LV function and  moderate AS- re-evaluate; EF now 30-35% with regional WMA   Bilateral lower extremity edema, secondary to acute CHF   Aortic stenosis, severe   CAD, RCA/PDA, 30-40% LM at cath 09/20/12   Cellulitis, lower extremity- treated   PAD (peripheral artery disease), decreased bil. ABIs   Chronic obstructive pulmonary disease   Cardiomyopathy, ischemic- 30-35% 2D    Gout flare. 09/29/12, Lt knee, improved with colchicine.  Plan: Day 2 s/p AVR with pericardial valve, CABG x 2 and bilateral MAZE procedure. Continues to maintain NSR. HR in the 90s. BP is stable. CP free. Ambulating w/o difficulty. Continue current plan of care. No change in cardiac meds at this point. Continue with diuresing.    LOS: 24 days    [name] 10/10/2012 11:25 AM     Patient seen and examined. Agree with assessment and plan. Day 2 AVR/CABG/MAZE as above. Good diuresis yesterday with -1460 and so far today -665. CT and mediastinal tubes are out. Rhythm regular. Ambulate as tolerates. Consider starting ACE/ARB tomorrow and slowly titrate bb as BP allows.   Lennette Bihari, MD, [facility] 10/10/2012 11:45 AM

## 2012-10-10 NOTE — Progress Notes (Signed)
Patient was asked if he voided since his foley was removed earlier today, he states since removal he has voided several times, no urine output has been documented.  Currently patient does not have to void right now and has the urinal at bedside and was told to call the RN when he needed to void so that he could be assisted and the amount can be recorded.  Will continue to assess.

## 2012-10-10 NOTE — Progress Notes (Signed)
Patient ID: [name], male   DOB: 05-25-1950, 62 y.o.   MRN: [id number] TCTS DAILY ICU PROGRESS NOTE                   [address]            [place] [id number]          [phone]   2 Days Post-Op Procedure(s) (LRB): CORONARY ARTERY BYPASS GRAFTING (CABG) (N/A) AORTIC VALVE REPLACEMENT (AVR) (N/A) MAZE (N/A) INTRAOPERATIVE TRANSESOPHAGEAL ECHOCARDIOGRAM (N/A) ENDOVEIN HARVEST OF GREATER SAPHENOUS VEIN (Bilateral)  Total Length of Stay:  LOS: 24 days   Subjective: Walked in unit 100 feet  Objective: Vital signs in last 24 hours: Temp:  [98.2 F (36.8 C)-99.1 F (37.3 C)] 98.3 F (36.8 C) (06/19 0802) Pulse Rate:  [86-110] 89 (06/19 0800) Cardiac Rhythm:  [-] Normal sinus rhythm (06/19 0800) Resp:  [20-28] 27 (06/19 0800) BP: (83-144)/(36-107) 105/50 mmHg (06/19 0800) SpO2:  [90 %-100 %] 93 % (06/19 0800) Arterial Line BP: (117-156)/(49-65) 156/65 mmHg (06/18 1400) Weight:  [202 lb 2.6 oz (91.7 kg)] 202 lb 2.6 oz (91.7 kg) (06/19 0600)  Filed Weights   10/08/12 0458 10/09/12 0600 10/10/12 0600  Weight: 186 lb 14.4 oz (84.777 kg) 202 lb 13.2 oz (92 kg) 202 lb 2.6 oz (91.7 kg)    Weight change: -10.6 oz (-0.3 kg)   Hemodynamic parameters for last 24 hours: PAP: (36-45)/(17-26) 41/19 mmHg  Intake/Output from previous day: 06/18 0701 - 06/19 0700 In: 774.5 [P.O.:190; I.V.:484.5; IV Piggyback:100] Out: 2235 [Urine:1985; Chest Tube:250]  Intake/Output this shift: Total I/O In: 20 [I.V.:20] Out: 50 [Urine:50]  Current Meds: Scheduled Meds: . acetaminophen  1,000 mg Oral Q6H   Or  . acetaminophen (TYLENOL) oral liquid 160 mg/5 mL  975 mg Per Tube Q6H  . aspirin EC  325 mg Oral Daily   Or  . aspirin  324 mg Per Tube Daily  . atorvastatin  40 mg Oral q1800  . bisacodyl  10 mg Oral Daily   Or  . bisacodyl  10 mg Rectal Daily  . digoxin  0.125 mg Oral Daily  . docusate sodium  200 mg Oral Daily  . insulin aspart  0-24 Units Subcutaneous  Q4H  . insulin detemir  10 Units Subcutaneous Q1200  . insulin regular  0-10 Units Intravenous TID WC  . levalbuterol  0.63 mg Nebulization Q8H  . metoprolol tartrate  12.5 mg Oral BID   Or  . metoprolol tartrate  12.5 mg Per Tube BID  . pantoprazole  40 mg Oral Daily  . sodium chloride  3 mL Intravenous Q12H   Continuous Infusions: . sodium chloride 20 mL/hr at 10/09/12 1000  . sodium chloride 20 mL/hr (10/08/12 1615)  . sodium chloride    . dexmedetomidine Stopped (10/08/12 2000)  . DOPamine Stopped (10/09/12 1400)  . insulin (NOVOLIN-R) infusion 1.3 Units/hr (10/09/12 0650)  . lactated ringers 20 mL/hr at 10/10/12 0600  . nitroGLYCERIN Stopped (10/08/12 1900)  . phenylephrine (NEO-SYNEPHRINE) Adult infusion Stopped (10/09/12 1200)   PRN Meds:.metoprolol, midazolam, morphine injection, ondansetron (ZOFRAN) IV, oxyCODONE, sodium chloride  General appearance: alert, cooperative and no distress Neurologic: intact Heart: regular rate and rhythm, S1, S2 normal, no murmur, click, rub or gallop and regular rate and rhythm Lungs: dullness to percussion bibasilar Abdomen: soft, non-tender; bowel sounds normal; no masses,  no organomegaly Extremities: edema at ankles Wound: sternum stable  Lab Results: CBC: Recent Labs  10/09/12 1650  10/10/12 0435  WBC 8.9 8.6  HGB 10.3*  10.5* 9.6*  HCT 32.1*  31.0* 29.3*  PLT 144* 133*   BMET:  Recent Labs  10/09/12 0430 10/09/12 1650 10/10/12 0435  NA 134* 135 137  K 4.4 4.4 4.2  CL 104 104 103  CO2 23  --  27  GLUCOSE 134* 124* 90  BUN 17 18 18   CREATININE 0.80 0.92  1.00 0.87  CALCIUM 8.5  --  8.7    PT/INR:  Recent Labs  10/08/12 1600  LABPROT 15.8*  INR 1.29   Radiology: Dg Chest Port 1 View  10/10/2012   *RADIOLOGY REPORT*  Clinical Data: Check chest tube.  Postop.  PORTABLE CHEST - 1 VIEW  Comparison: 10/09/2012  Findings: Stable position of the right chest tube.  No significant pneumothorax.  Stable hazy densities  at the right lung base.  Theone Murdoch catheter has been removed but the right jugular central line is still present.  Stable appearance of the heart with postsurgical changes.  IMPRESSION: Stable right chest tube.  No significant pneumothorax.  Right basilar densities are most compatible with atelectasis.   Original Report Authenticated By: Richarda Overlie, M.D.   Dg Chest Portable 1 View In Am  10/09/2012   *RADIOLOGY REPORT*  Clinical Data: Coronary bypass graft surgery postop  PORTABLE CHEST - 1 VIEW  Comparison: 10/08/2012  Findings: Endotracheal tube has been removed.  Swan-Ganz catheter tip in the right main pulmonary artery.  Right chest tube in place. No pneumothorax.  Increased pulmonary edema bilaterally.  Increased bibasilar atelectasis.  IMPRESSION: Increase in pulmonary edema and bibasilar atelectasis following extubation.   Original Report Authenticated By: Janeece Riggers, M.D.   Dg Chest Portable 1 View  10/08/2012   *RADIOLOGY REPORT*  Clinical Data: Status post cardiac surgery  PORTABLE CHEST - 1 VIEW  Comparison: 10/07/2012  Findings: The cardiac shadow is stable.  The endotracheal tube and nasogastric catheter are seen in satisfactory position.  The Theone Murdoch catheter is noted in the right pulmonary artery.  A left-sided thoracostomy catheter and mediastinal drain are noted.  The left atrial clip is seen. The lungs are clear.  No pneumothorax is noted.  IMPRESSION: Postsurgical changes with tubes and lines as described.  No acute abnormality is noted.   Original Report Authenticated By: Alcide Clever, M.D.     Assessment/Plan: S/P Procedure(s) (LRB): CORONARY ARTERY BYPASS GRAFTING (CABG) (N/A) AORTIC VALVE REPLACEMENT (AVR) (N/A) MAZE (N/A) INTRAOPERATIVE TRANSESOPHAGEAL ECHOCARDIOGRAM (N/A) ENDOVEIN HARVEST OF GREATER SAPHENOUS VEIN (Bilateral) Mobilize Diuresis D/c foley Will need coumadin, holding sinus now     Krisann Mckenna B 10/10/2012 8:17 AM

## 2012-10-10 NOTE — Progress Notes (Signed)
Patient ID: [name], male   DOB: 1950-12-14, 62 y.o.   MRN: [id number] EVENING ROUNDS NOTE :     [address]       [place] [address]             [phone]                 2 Days Post-Op Procedure(s) (LRB): CORONARY ARTERY BYPASS GRAFTING (CABG) (N/A) AORTIC VALVE REPLACEMENT (AVR) (N/A) MAZE (N/A) INTRAOPERATIVE TRANSESOPHAGEAL ECHOCARDIOGRAM (N/A) ENDOVEIN HARVEST OF GREATER SAPHENOUS VEIN (Bilateral)  Total Length of Stay:  LOS: 24 days  BP 109/52  Pulse 94  Temp(Src) 98.3 F (36.8 C) (Oral)  Resp 24  Ht 5\' 10"  (1.778 m)  Wt 202 lb 2.6 oz (91.7 kg)  BMI 29.01 kg/m2  SpO2 96%  .Intake/Output     06/18 0701 - 06/19 0700 06/19 0701 - 06/20 0700   P.O. 190 270   I.V. (mL/kg) 484.5 (5.3) 220 (2.4)   Blood     NG/GT     IV Piggyback 100    Total Intake(mL/kg) 774.5 (8.4) 490 (5.3)   Urine (mL/kg/hr) 1985 (0.9) 1175 (1.1)   Emesis/NG output     Blood     Chest Tube 250 (0.1) 20 (0)   Total Output 2235 1195   Net -1460.5 -705        Stool Occurrence  1 x     . sodium chloride 20 mL/hr at 10/09/12 1000  . sodium chloride 20 mL/hr (10/08/12 1615)  . sodium chloride    . dexmedetomidine Stopped (10/08/12 2000)  . DOPamine Stopped (10/09/12 1400)  . insulin (NOVOLIN-R) infusion 1.3 Units/hr (10/09/12 0650)  . lactated ringers 20 mL/hr at 10/10/12 0600  . nitroGLYCERIN Stopped (10/08/12 1900)  . phenylephrine (NEO-SYNEPHRINE) Adult infusion Stopped (10/09/12 1200)     Lab Results  Component Value Date   WBC 8.6 10/10/2012   HGB 9.6* 10/10/2012   HCT 29.3* 10/10/2012   PLT 133* 10/10/2012   GLUCOSE 90 10/10/2012   CHOL 145 09/17/2012   TRIG 77 09/17/2012   HDL 47 09/17/2012   LDLCALC 83 09/17/2012   ALT 18 09/16/2012   AST 30 09/16/2012   NA 137 10/10/2012   K 4.2 10/10/2012   CL 103 10/10/2012   CREATININE 0.87 10/10/2012   BUN 18 10/10/2012   CO2 27 10/10/2012   TSH 1.994 09/17/2012   INR 1.29 10/08/2012   HGBA1C 5.2 10/07/2012   Stable  day, sinus with pac's, start coumadin   Delight Ovens MD  Beeper [phone] Office [phone] 10/10/2012 6:26 PM

## 2012-10-11 ENCOUNTER — Inpatient Hospital Stay (HOSPITAL_COMMUNITY): Payer: Medicaid Other

## 2012-10-11 ENCOUNTER — Other Ambulatory Visit: Payer: Self-pay | Admitting: *Deleted

## 2012-10-11 DIAGNOSIS — I2510 Atherosclerotic heart disease of native coronary artery without angina pectoris: Secondary | ICD-10-CM

## 2012-10-11 LAB — BASIC METABOLIC PANEL WITH GFR
BUN: 20 mg/dL (ref 6–23)
CO2: 27 meq/L (ref 19–32)
Calcium: 8.6 mg/dL (ref 8.4–10.5)
Chloride: 101 meq/L (ref 96–112)
Creatinine, Ser: 0.75 mg/dL (ref 0.50–1.35)
GFR calc Af Amer: >90 mL/min (ref >90)
GFR calc non Af Amer: >90 mL/min (ref >90)
Glucose, Bld: 73 mg/dL (ref 70–99)
Potassium: 4 meq/L (ref 3.5–5.1)
Sodium: 135 meq/L (ref 135–145)

## 2012-10-11 LAB — GLUCOSE, CAPILLARY
Glucose-Capillary: 80 mg/dL (ref 70–99)
Glucose-Capillary: 72 mg/dL (ref 70–99)

## 2012-10-11 LAB — CBC
HCT: 26.5 % — ABNORMAL LOW (ref 39.0–52.0)
Hemoglobin: 8.9 g/dL — ABNORMAL LOW (ref 13.0–17.0)
MCH: 30.1 pg (ref 26.0–34.0)
MCHC: 33.6 g/dL (ref 30.0–36.0)
MCV: 89.5 fL (ref 78.0–100.0)
Platelets: 134 K/uL — ABNORMAL LOW (ref 150–400)
RBC: 2.96 MIL/uL — ABNORMAL LOW (ref 4.22–5.81)
RDW: 15.3 % (ref 11.5–15.5)
WBC: 6.9 K/uL (ref 4.0–10.5)

## 2012-10-11 LAB — PROTIME-INR
INR: 1 (ref 0.00–1.49)
Prothrombin Time: 13.1 s (ref 11.6–15.2)

## 2012-10-11 MED ORDER — SIMVASTATIN 40 MG PO TABS: 40.0000 mg | ORAL_TABLET | Freq: Every day | ORAL | Status: DC

## 2012-10-11 MED ORDER — LEVALBUTEROL HCL 0.63 MG/3ML IN NEBU: 0.6300 mg | INHALATION_SOLUTION | Freq: Four times a day (QID) | RESPIRATORY_TRACT | Status: DC | PRN

## 2012-10-11 MED ORDER — SIMVASTATIN 20 MG PO TABS
20.0000 mg | ORAL_TABLET | Freq: Every day | ORAL | Status: DC
  Administered 2012-10-12 – 2012-10-17 (×6): 20 mg via ORAL
  Filled 2012-10-11 (×7): qty 1

## 2012-10-11 MED ORDER — WARFARIN SODIUM 2.5 MG PO TABS
2.5000 mg | ORAL_TABLET | Freq: Once | ORAL | Status: AC
  Administered 2012-10-11: 2.5 mg via ORAL
  Filled 2012-10-11 (×2): qty 1

## 2012-10-11 MED ORDER — ONDANSETRON HCL 4 MG PO TABS: 4.0000 mg | ORAL_TABLET | Freq: Four times a day (QID) | ORAL | Status: DC | PRN

## 2012-10-11 MED ORDER — OXYCODONE HCL 5 MG PO TABS
5.0000 mg | ORAL_TABLET | ORAL | Status: DC | PRN
  Administered 2012-10-13 – 2012-10-14 (×4): 10 mg via ORAL
  Administered 2012-10-15: 5 mg via ORAL
  Administered 2012-10-15 – 2012-10-16 (×4): 10 mg via ORAL
  Administered 2012-10-16 – 2012-10-17 (×4): 5 mg via ORAL
  Filled 2012-10-11 (×2): qty 1
  Filled 2012-10-11 (×3): qty 2
  Filled 2012-10-11: qty 1
  Filled 2012-10-11: qty 2
  Filled 2012-10-11: qty 1
  Filled 2012-10-11 (×3): qty 2
  Filled 2012-10-11: qty 1
  Filled 2012-10-11: qty 2

## 2012-10-11 MED ORDER — DOCUSATE SODIUM 100 MG PO CAPS
200.0000 mg | ORAL_CAPSULE | Freq: Every day | ORAL | Status: DC
  Administered 2012-10-11 – 2012-10-16 (×4): 200 mg via ORAL
  Filled 2012-10-11 (×7): qty 2

## 2012-10-11 MED ORDER — POTASSIUM CHLORIDE CRYS ER 20 MEQ PO TBCR
20.0000 meq | EXTENDED_RELEASE_TABLET | Freq: Every day | ORAL | Status: DC
  Administered 2012-10-11 – 2012-10-16 (×6): 20 meq via ORAL
  Filled 2012-10-11 (×8): qty 1

## 2012-10-11 MED ORDER — WARFARIN VIDEO
Freq: Once | Status: AC
  Administered 2012-10-11: 08:00:00

## 2012-10-11 MED ORDER — ONDANSETRON HCL 4 MG/2ML IJ SOLN: 4.0000 mg | Freq: Four times a day (QID) | INTRAMUSCULAR | Status: DC | PRN

## 2012-10-11 MED ORDER — BISACODYL 10 MG RE SUPP: 10.0000 mg | Freq: Every day | RECTAL | Status: DC | PRN

## 2012-10-11 MED ORDER — DILTIAZEM LOAD VIA INFUSION
15.0000 mg | Freq: Once | INTRAVENOUS | Status: DC
  Filled 2012-10-11: qty 15

## 2012-10-11 MED ORDER — PANTOPRAZOLE SODIUM 40 MG PO TBEC
40.0000 mg | DELAYED_RELEASE_TABLET | Freq: Every day | ORAL | Status: DC
  Administered 2012-10-11 – 2012-10-18 (×8): 40 mg via ORAL
  Filled 2012-10-11 (×8): qty 1

## 2012-10-11 MED ORDER — MOVING RIGHT ALONG BOOK
Freq: Once | Status: AC
  Administered 2012-10-11: 08:00:00
  Filled 2012-10-11: qty 1

## 2012-10-11 MED ORDER — CARVEDILOL 6.25 MG PO TABS
6.2500 mg | ORAL_TABLET | Freq: Two times a day (BID) | ORAL | Status: DC
  Administered 2012-10-11 – 2012-10-18 (×10): 6.25 mg via ORAL
  Filled 2012-10-11 (×16): qty 1

## 2012-10-11 MED ORDER — ACETAMINOPHEN 325 MG PO TABS: 650.0000 mg | ORAL_TABLET | Freq: Four times a day (QID) | ORAL | Status: DC | PRN

## 2012-10-11 MED ORDER — AMIODARONE HCL IN DEXTROSE 360-4.14 MG/200ML-% IV SOLN
60.0000 mg/h | INTRAVENOUS | Status: AC
  Administered 2012-10-11: 60 mg/h via INTRAVENOUS
  Filled 2012-10-11 (×2): qty 200

## 2012-10-11 MED ORDER — BISACODYL 5 MG PO TBEC: 10.0000 mg | DELAYED_RELEASE_TABLET | Freq: Every day | ORAL | Status: DC | PRN

## 2012-10-11 MED ORDER — ASPIRIN EC 81 MG PO TBEC
81.0000 mg | DELAYED_RELEASE_TABLET | Freq: Every day | ORAL | Status: DC
  Administered 2012-10-11 – 2012-10-18 (×8): 81 mg via ORAL
  Filled 2012-10-11 (×8): qty 1

## 2012-10-11 MED ORDER — TRAMADOL HCL 50 MG PO TABS
50.0000 mg | ORAL_TABLET | ORAL | Status: DC | PRN
  Administered 2012-10-12 – 2012-10-13 (×3): 50 mg via ORAL
  Administered 2012-10-13 – 2012-10-14 (×5): 100 mg via ORAL
  Filled 2012-10-11: qty 1
  Filled 2012-10-11 (×2): qty 2
  Filled 2012-10-11: qty 1
  Filled 2012-10-11 (×2): qty 2
  Filled 2012-10-11: qty 1
  Filled 2012-10-11 (×3): qty 2

## 2012-10-11 MED ORDER — SODIUM CHLORIDE 0.9 % IJ SOLN
3.0000 mL | Freq: Two times a day (BID) | INTRAMUSCULAR | Status: DC
  Administered 2012-10-11 – 2012-10-16 (×5): 3 mL via INTRAVENOUS
  Administered 2012-10-16: 22:00:00 via INTRAVENOUS
  Administered 2012-10-17: 3 mL via INTRAVENOUS
  Administered 2012-10-17: 22:00:00 via INTRAVENOUS

## 2012-10-11 MED ORDER — AMIODARONE LOAD VIA INFUSION
150.0000 mg | Freq: Once | INTRAVENOUS | Status: AC
  Administered 2012-10-11: 150 mg via INTRAVENOUS
  Filled 2012-10-11 (×2): qty 83.34

## 2012-10-11 MED ORDER — PATIENT'S GUIDE TO USING COUMADIN BOOK
Freq: Once | Status: AC
  Administered 2012-10-11: 08:00:00
  Filled 2012-10-11: qty 1

## 2012-10-11 MED ORDER — INSULIN ASPART 100 UNIT/ML ~~LOC~~ SOLN: 0.0000 [IU] | Freq: Three times a day (TID) | SUBCUTANEOUS | Status: DC

## 2012-10-11 MED ORDER — FUROSEMIDE 40 MG PO TABS
40.0000 mg | ORAL_TABLET | Freq: Every day | ORAL | Status: DC
  Administered 2012-10-11 – 2012-10-12 (×2): 40 mg via ORAL
  Filled 2012-10-11 (×3): qty 1

## 2012-10-11 MED ORDER — AMIODARONE HCL IN DEXTROSE 360-4.14 MG/200ML-% IV SOLN
30.0000 mg/h | INTRAVENOUS | Status: DC
  Administered 2012-10-11 – 2012-10-14 (×6): 30 mg/h via INTRAVENOUS
  Filled 2012-10-11 (×12): qty 200

## 2012-10-11 MED ORDER — SODIUM CHLORIDE 0.9 % IV SOLN: 250.0000 mL | INTRAVENOUS | Status: DC | PRN

## 2012-10-11 MED ORDER — LISINOPRIL 2.5 MG PO TABS
2.5000 mg | ORAL_TABLET | Freq: Every day | ORAL | Status: DC
  Administered 2012-10-11: 2.5 mg via ORAL
  Filled 2012-10-11 (×3): qty 1

## 2012-10-11 MED ORDER — SODIUM CHLORIDE 0.9 % IJ SOLN: 3.0000 mL | INTRAMUSCULAR | Status: DC | PRN

## 2012-10-11 NOTE — Progress Notes (Signed)
CARDIAC REHAB PHASE I   PRE:  Rate/Rhythm: 107 afib    BP: sitting 120/54    SaO2: 94 RA  MODE:  Ambulation: 350 ft   POST:  Rate/Rhythm: 126 afib    BP: sitting 130/54     SaO2: 92 RA  Pt with weak legs standing. Needed to rock for legs to support him getting up from recliner. Used RW, assist x1. SaO2 good walking however DOE. Required 2-3 rest stops. Productive with phlegm after walk. Will continue to follow. Pt wants to walk 1-2 more times tonight. [id number]   Elissa Lovett [place] CES, ACSM 10/11/2012 2:35 PM

## 2012-10-11 NOTE — Progress Notes (Signed)
Subjective: Doing well.  Ambulating without difficulties.  Objective: Vital signs in last 24 hours: Temp:  [97.6 F (36.4 C)-98.8 F (37.1 C)] 98.4 F (36.9 C) (06/20 1345) Pulse Rate:  [71-106] 106 (06/20 1345) Resp:  [18-29] 18 (06/20 1345) BP: (101-129)/(32-87) 115/51 mmHg (06/20 1345) SpO2:  [91 %-99 %] 92 % (06/20 1345) Last BM Date: 10/10/12  Intake/Output from previous day: 06/19 0701 - 06/20 0700 In: 880 [P.O.:420; I.V.:460] Out: 1895 [Urine:1875; Chest Tube:20] Intake/Output this shift: Total I/O In: 500 [P.O.:480; I.V.:20] Out: 300 [Urine:300]  Medications Current Facility-Administered Medications  Medication Dose Route Frequency Provider Last Rate Last Dose  . 0.9 %  sodium chloride infusion  250 mL Intravenous PRN Delight Ovens, MD      . acetaminophen (TYLENOL) tablet 650 mg  650 mg Oral Q6H PRN Delight Ovens, MD      . aspirin EC tablet 81 mg  81 mg Oral Daily Delight Ovens, MD   81 mg at 10/11/12 0932  . bisacodyl (DULCOLAX) EC tablet 10 mg  10 mg Oral Daily PRN Delight Ovens, MD       Or  . bisacodyl (DULCOLAX) suppository 10 mg  10 mg Rectal Daily PRN Delight Ovens, MD      . carvedilol (COREG) tablet 6.25 mg  6.25 mg Oral BID WC Delight Ovens, MD      . digoxin Margit Banda) tablet 0.125 mg  0.125 mg Oral Daily Delight Ovens, MD   0.125 mg at 10/11/12 0932  . docusate sodium (COLACE) capsule 200 mg  200 mg Oral Daily Rowe Clack, PA-C   200 mg at 10/10/12 0947  . docusate sodium (COLACE) capsule 200 mg  200 mg Oral Daily Delight Ovens, MD   200 mg at 10/11/12 0932  . furosemide (LASIX) tablet 40 mg  40 mg Oral Daily Delight Ovens, MD   40 mg at 10/11/12 0932  . levalbuterol (XOPENEX) nebulizer solution 0.63 mg  0.63 mg Nebulization Q8H Delight Ovens, MD   0.63 mg at 10/11/12 0007  . lisinopril (PRINIVIL,ZESTRIL) tablet 2.5 mg  2.5 mg Oral Daily Delight Ovens, MD      . ondansetron [facility]) tablet 4 mg  4 mg Oral  Q6H PRN Delight Ovens, MD       Or  . ondansetron [facility]) injection 4 mg  4 mg Intravenous Q6H PRN Delight Ovens, MD      . oxyCODONE (Oxy IR/ROXICODONE) immediate release tablet 5-10 mg  5-10 mg Oral Q3H PRN Delight Ovens, MD      . pantoprazole (PROTONIX) EC tablet 40 mg  40 mg Oral QAC breakfast Delight Ovens, MD   40 mg at 10/11/12 [phone]  . potassium chloride SA (K-DUR,KLOR-CON) CR tablet 20 mEq  20 mEq Oral Daily Delight Ovens, MD   20 mEq at 10/11/12 0932  . [START ON 10/12/2012] simvastatin (ZOCOR) tablet 40 mg  40 mg Oral q1800 Donielle Margaretann Loveless, PA-C      . sodium chloride 0.9 % injection 3 mL  3 mL Intravenous Q12H Delight Ovens, MD   3 mL at 10/11/12 0933  . sodium chloride 0.9 % injection 3 mL  3 mL Intravenous PRN Delight Ovens, MD      . traMADol Janean Sark) tablet 50-100 mg  50-100 mg Oral Q4H PRN Delight Ovens, MD      . warfarin (COUMADIN) tablet 2.5 mg  2.5 mg Oral ONCE-1800 Delight Ovens, MD      . Warfarin - Physician Dosing Inpatient   Does not apply q1800 Delight Ovens, MD        PE: General appearance: alert, cooperative and no distress Lungs: clear to auscultation bilaterally Heart: regularly irregular rhythm and no MM Extremities: 2+ LEE Pulses: 2+ and symmetric Neurologic: Grossly normal  Lab Results:   Recent Labs  10/09/12 1650 10/10/12 0435 10/11/12 0500  WBC 8.9 8.6 6.9  HGB 10.3*  10.5* 9.6* 8.9*  HCT 32.1*  31.0* 29.3* 26.5*  PLT 144* 133* 134*   BMET  Recent Labs  10/09/12 0430 10/09/12 1650 10/10/12 0435 10/11/12 0500  NA 134* 135 137 135  K 4.4 4.4 4.2 4.0  CL 104 104 103 101  CO2 23  --  27 27  GLUCOSE 134* 124* 90 73  BUN 17 18 18 20   CREATININE 0.80 0.92  1.00 0.87 0.75  CALCIUM 8.5  --  8.7 8.6   PT/INR  Recent Labs  10/08/12 1600 10/11/12 0500  LABPROT 15.8* 13.1  INR 1.29 1.00   Cholesterol No results found for this basename: CHOL,  in the last 72 hours Cardiac Enzymes No  components found with this basename: TROPONIN,  CK[facility],   Studies/Results: PORTABLE CHEST - 1 VIEW  Comparison: 10/10/2012  Findings: The right chest tube has been removed. No pneumothorax is identified. Stable mild interstitial edema is present. Bilateral lower lobe atelectasis is stable. The heart size is stable.  IMPRESSION: No pneumothorax after chest tube removal. Mild interstitial edema is present.   Assessment/Plan   Principal Problem:   Acute respiratory failure, with BiPAP needed on admit Active Problems:   PAF (paroxysmal atrial fibrillation), he has had periods of NSR since admission   HTN (hypertension)   Hyperlipidemia   Noncompliance, no meds for two weeks prior to admission   Tobacco use   Acute combined systolic and diastolic CHF, NYHA class 3 -- previous normal LV function and moderate AS- re-evaluate; EF now 30-35% with regional WMA   Bilateral lower extremity edema, secondary to acute CHF   Aortic stenosis, severe   CAD, RCA/PDA, 30-40% LM at cath 09/20/12   Cellulitis, lower extremity- treated   PAD (peripheral artery disease), decreased bil. ABIs   Chronic obstructive pulmonary disease   Cardiomyopathy, ischemic- 30-35% 2D    Gout flare. 09/29/12, Lt knee, improved with colchicine.  Plan:  POD #3,  AVR with pericardial valve, CABG x 2 and bilateral MAZE procedure.   Ambulating well.  On PO lasix for fluid removal.  ASA, coreg, digoxin, lisinopril, zocor, coumadin.    LOS: 25 days    HAGER, BRYAN 10/11/2012 3:05 PM  I have seen and examined the patient along with Wilburt Finlay, PA.  I have reviewed the chart, notes and new data.  I agree with PA's note.  Key new complaints: a little weak and dyspneic Key examination changes: had been in NSR with very frequent PACs, but went into AF with RVR within the last hour   PLAN: Already on beta blockers and digoxin. Start IV amiodarone.   Thurmon Fair, MD, [facility] [phone] 10/11/2012, 5:43 PM

## 2012-10-11 NOTE — Progress Notes (Signed)
Patient ID: [name], male   DOB: 1950-05-10, 62 y.o.   MRN: [id number] TCTS DAILY ICU PROGRESS NOTE                   [address]            [place] [address]          [phone]   3 Days Post-Op Procedure(s) (LRB): CORONARY ARTERY BYPASS GRAFTING (CABG) (N/A) AORTIC VALVE REPLACEMENT (AVR) (N/A) MAZE (N/A) INTRAOPERATIVE TRANSESOPHAGEAL ECHOCARDIOGRAM (N/A) ENDOVEIN HARVEST OF GREATER SAPHENOUS VEIN (Bilateral)  Total Length of Stay:  LOS: 25 days   Subjective: Awake and alert, no confusion, walked around unit this am  Objective: Vital signs in last 24 hours: Temp:  [97.6 F (36.4 C)-98.7 F (37.1 C)] 97.6 F (36.4 C) (06/20 0352) Pulse Rate:  [71-100] 83 (06/20 0500) Cardiac Rhythm:  [-] Normal sinus rhythm (06/19 2000) Resp:  [21-29] 22 (06/20 0500) BP: (92-129)/(36-69) 107/50 mmHg (06/20 0500) SpO2:  [91 %-98 %] 97 % (06/20 0500)  Filed Weights   10/08/12 0458 10/09/12 0600 10/10/12 0600  Weight: 186 lb 14.4 oz (84.777 kg) 202 lb 13.2 oz (92 kg) 202 lb 2.6 oz (91.7 kg)    Weight change:    Hemodynamic parameters for last 24 hours:    Intake/Output from previous day: 06/19 0701 - 06/20 0700 In: 840 [P.O.:420; I.V.:420] Out: 1645 [Urine:1625; Chest Tube:20]  Intake/Output this shift:    Current Meds: Scheduled Meds: . acetaminophen  1,000 mg Oral Q6H   Or  . acetaminophen (TYLENOL) oral liquid 160 mg/5 mL  975 mg Per Tube Q6H  . aspirin EC  81 mg Oral Daily   Or  . aspirin  81 mg Per Tube Daily  . atorvastatin  40 mg Oral q1800  . bisacodyl  10 mg Oral Daily   Or  . bisacodyl  10 mg Rectal Daily  . digoxin  0.125 mg Oral Daily  . docusate sodium  200 mg Oral Daily  . insulin aspart  0-24 Units Subcutaneous Q4H  . insulin detemir  10 Units Subcutaneous Q1200  . insulin regular  0-10 Units Intravenous TID WC  . levalbuterol  0.63 mg Nebulization Q8H  . metoprolol tartrate  12.5 mg Oral BID   Or  . metoprolol tartrate   12.5 mg Per Tube BID  . pantoprazole  40 mg Oral Daily  . sodium chloride  3 mL Intravenous Q12H  . Warfarin - Physician Dosing Inpatient   Does not apply q1800   Continuous Infusions: . sodium chloride 20 mL/hr at 10/09/12 1000  . sodium chloride 20 mL/hr (10/08/12 1615)  . sodium chloride    . dexmedetomidine Stopped (10/08/12 2000)  . DOPamine Stopped (10/09/12 1400)  . insulin (NOVOLIN-R) infusion 1.3 Units/hr (10/09/12 0650)  . lactated ringers 20 mL/hr at 10/10/12 0600  . nitroGLYCERIN Stopped (10/08/12 1900)  . phenylephrine (NEO-SYNEPHRINE) Adult infusion Stopped (10/09/12 1200)   PRN Meds:.metoprolol, midazolam, morphine injection, ondansetron (ZOFRAN) IV, oxyCODONE, sodium chloride  General appearance: alert, cooperative and no distress Neurologic: intact Heart: regular rate and rhythm, S1, S2 normal, no murmur, click, rub or gallop Lungs: diminished breath sounds bibasilar Abdomen: soft, non-tender; bowel sounds normal; no masses,  no organomegaly Extremities: edema  both ankles  Wound: sternum stable  Lab Results: CBC: Recent Labs  10/10/12 0435 10/11/12 0500  WBC 8.6 6.9  HGB 9.6* 8.9*  HCT 29.3* 26.5*  PLT 133* 134*   BMET:  Recent Labs  10/10/12 0435 10/11/12 0500  NA 137 135  K 4.2 4.0  CL 103 101  CO2 27 27  GLUCOSE 90 73  BUN 18 20  CREATININE 0.87 0.75  CALCIUM 8.7 8.6    PT/INR:  Recent Labs  10/11/12 0500  LABPROT 13.1  INR 1.00   Radiology: Dg Chest Port 1 View  10/10/2012   *RADIOLOGY REPORT*  Clinical Data: Check chest tube.  Postop.  PORTABLE CHEST - 1 VIEW  Comparison: 10/09/2012  Findings: Stable position of the right chest tube.  No significant pneumothorax.  Stable hazy densities at the right lung base.  Theone Murdoch catheter has been removed but the right jugular central line is still present.  Stable appearance of the heart with postsurgical changes.  IMPRESSION: Stable right chest tube.  No significant pneumothorax.  Right  basilar densities are most compatible with atelectasis.   Original Report Authenticated By: Richarda Overlie, M.D.     Assessment/Plan: S/P Procedure(s) (LRB): CORONARY ARTERY BYPASS GRAFTING (CABG) (N/A) AORTIC VALVE REPLACEMENT (AVR) (N/A) MAZE (N/A) INTRAOPERATIVE TRANSESOPHAGEAL ECHOCARDIOGRAM (N/A) ENDOVEIN HARVEST OF GREATER SAPHENOUS VEIN (Bilateral) Mobilize Diuresis d/c tubes/lines Plan for transfer to step-down: see transfer orders coumadin started for history of AFIB, currently holding sinus      Dainelle Hun B 10/11/2012 7:21 AM

## 2012-10-11 NOTE — Progress Notes (Signed)
Patient was able to spontaneously void 450cc of urine, he stated he felt much better.

## 2012-10-11 NOTE — Discharge Summary (Signed)
Physician Discharge Summary       [address] [place]       [name] [address]             [phone]    Patient ID: [name] MRN: [id number] DOB/AGE: 05-07-1950 62 y.o.  Admit date: 09/16/2012 Discharge date: 10/17/2012  Admission Diagnoses: 1.History of moderate aortic stenosis 2.Multivessel CAD 3. History of CHF 4.History of a fib (with RVR) 5.History of hypertension 6.History of hyperlipidemia 7.History of tobacco abuse 8.History of PVD (s/p aorto bifem 10') 9. History of gout  Discharge Diagnoses:  1.History of severe aortic stenosis 2.Multivessel CAD 3. History of CHF 4.History of a fib (with RVR) 5.History of hypertension 6.History of hyperlipidemia 7.History of tobacco abuse 8.History of PVD (s/p aorto bifem 10') 9. History of gout 10.ABL anemia 11.Mild thrombocytopenia-resolved prior to discharge  Consults: Dr. Tyson Alias (CCM/Pulmonary) and Dr. Drue Second (Infectious Disease)  Procedure (s):  1. Cardiac Catheterization done by Dr. Allyson Sabal on 09/20/2012: (1.) Left main; 30-40% the distal  (2.) LAD; normal  (3.) Left circumflex; nondominant and normal.  (4.) Right coronary artery; dominant with 80% distal at the crux, 90% ostial PDA  (5.) Left ventriculography;not performed  (6.) Supravalvular aortography: The aortic valve was calcified. There is no obvious valve leaflet motion. There is no aortic insufficiency. The aortic root looked normal in caliber.  2.Aortic valve replacement with pericardial tissue valve,  [employer], model 3300TFX 23 mm, serial [id number]. Coronary  artery bypass grafting x2 with sequential reverse saphenous vein graft  to the posterior descending and posterolateral branches of the right  coronary artery. Right and left radiofrequency ablation MAZE, placement  of AtriCure left atrial clip with endovein harvesting both sides by Dr. Tyrone Sage on 10/08/2012.   History of Presenting Illness: This is a 62 year old  Caucasian male with progressively worsening shortness of breath, leg swelling, and palpitations for the past week. He states he is out of his medications for the past month, secondary to not be able to afford them. For 3 weeks before admission, hed had progressive lower extremity edema until time of admission when he noted anasarca to mid chest and erythremia of his legs. He notes he has been unable to climb stairs or walk fast for over one year.  He denies any chest pain. He states he has a history of CHF and atrial fibrillation. He is not on coumadin for afib currentlyHe arrived in moderate respiratory distress with abdominal breathing and crackles throughout. On telemetry, he was in A. fib with RVR in the 150s. He denied any chest pain, cough or fever. good by mouth intake. He also states he has not seen his cardiologist Dr.Berry in more than a year. He was tachypneic with pursed lip breathing with rales. Bipap was applied and IV Lasix was given. EKG confirmed A fib with RVR and he was given Cardizem. He has a history of moderate AS on echo 05/2011,  concentric LVH, and normal LV function. He also has a history of PVD.Dr. Allyson Sabal had placed a left iliac stent, but pt then the patient had an aorto-bifemoral bypass grafting by Dr. Myra Gianotti. In addition, he has had ETOH withdrawal in past. Currently, he  continues to smoke and drink alcohol on a daily basis.      He had an Echo done. Results showed LVEF 30-35%,severe aortic stenosis, mild aortic regurgitation, and  Valve area: 0.88cm^2(VTI). Valve area: 0.78cm^2 (Vmax).   The mitral valve had a calcified annulus and mild  MR. There was a severely dilated RA and moderate TR. He then underwent a cardiac catheterization by Dr. Allyson Sabal on 09/20/2012. He was found to have two vessel coronary artery disease. Pre operative carotid duplex US showed no significant internal carotid artery stenosis bilaterally. Potential risks, complications, and benefits of the surgery were  discussed with the patient and he agreed to proceed. He underwent an AVR, CABG x 2, right and left MAZE, and placement of LA clip on 10/08/2012.   Brief Hospital Course:  The patient was extubated the evening of surgery without difficulty. He remained afebrile and hemodynamically stable. Theone Murdoch, a line, chest tubes, and foley were removed early in the post operative course. Coreg was started and titrated accordingly. He was started on Digoxin. He maintained SR initially. He then went into a fib with RVR. He was placed on an Amiodarone gttp. He converted to sinus rhythm. He was placed on Coumadin and his PT and INR were monitored daily.He was volume over loaded and diuresed with IV Lasix. He did have  ABL anemia. He did not require a post op transfusion. His last H and H was 9.9 and 29.2 He also had thrombocytopenia. This did resolve and his last platelet count was 154,000.He was weaned off the insulin drip. The patient's HGA1C pre op was 5.2. The patient was felt surgically stable for transfer from the ICU to PCTU for further convalescence on 10/11/2012. He continues to progress with cardiac rehab. He was ambulating on room air. He has been tolerating a diet and has had a bowel movement. Epicardial pacing wires and chest tube sutures will be removed prior to discharge. Provided the patient remains afebrile, hemodynamically stable, and pending morning round evaluation, He will be surgically stable for discharge on 10/18/2012.    Latest Vital Signs: Blood pressure 101/61, pulse 75, temperature 97.9 F (36.6 C), temperature source Oral, resp. rate 18, height 5\' 10"  (1.778 m), weight 89.6 kg (197 lb 8.5 oz), SpO2 92.00%.  Physical Exam: General appearance: alert, cooperative and no distress  Neurologic: intact  Heart: regular rate and rhythm, S1, S2 normal, no murmur, click, rub or gallop  Lungs: diminished breath sounds bibasilar  Abdomen: soft, non-tender; bowel sounds normal; no masses, no  organomegaly  Extremities: edema both ankles  Wound: sternum stable  Discharge Condition:Stable  Recent laboratory studies:  Lab Results  Component Value Date   WBC 7.4 10/12/2012   HGB 9.9* 10/12/2012   HCT 29.2* 10/12/2012   MCV 90.4 10/12/2012   PLT 154 10/12/2012   Lab Results  Component Value Date   NA 130* 10/16/2012   K 4.6 10/16/2012   CL 92* 10/16/2012   CO2 26 10/16/2012   CREATININE 1.52* 10/16/2012   GLUCOSE 79 10/16/2012      Diagnostic Studies:   Dg Chest 2 View:   RADIOLOGY REPORT*  Clinical Data: Interval chest tube removal  CHEST - 2 VIEW  Comparison: 10/11/2012  Findings: Status post median sternotomy CABG procedure. Normal  heart size. Small bilateral pleural effusions and mild pulmonary  edema is again noted and appears similar to previous exam. There  is subsegmental atelectasis noted the left base. There is a  significant pneumothorax identified after chest tube removal.  IMPRESSION:  1. No change from previous exam. Persistent small effusions and  mild edema.  3. No clinically significant pneumothorax identified status post  chest tube removal.  Original Report Authenticated By: Signa Kell, M.D.      Future Appointments Provider Department Dept  Phone   11/11/2012 1:30 PM [facility] [place] [phone]      Discharge Medications:  The patient has been discharged on:   Medication List    STOP taking these medications       aspirin 325 MG tablet     benazepril 20 MG tablet  Commonly known as:  LOTENSIN     benazepril-hydrochlorthiazide 20-12.5 MG per tablet  Commonly known as:  LOTENSIN HCT     metoprolol succinate 100 MG 24 hr tablet  Commonly known as:  TOPROL-XL      TAKE these medications       amiodarone 400 MG tablet  Commonly known as:  PACERONE  Take 1 tablet (400 mg total) by mouth 2 (two) times daily. X 1 week, then decrease to 200 mg po daily     aspirin 81 MG EC  tablet  Take 1 tablet (81 mg total) by mouth daily.     carvedilol 6.25 MG tablet  Commonly known as:  COREG  Take 1 tablet (6.25 mg total) by mouth 2 (two) times daily with a meal.     colchicine 0.6 MG tablet  Take 1 tablet (0.6 mg total) by mouth 2 (two) times daily.     digoxin 0.125 MG tablet  Commonly known as:  LANOXIN  Take 1 tablet (0.125 mg total) by mouth daily.     oxyCODONE 5 MG immediate release tablet  Commonly known as:  Oxy IR/ROXICODONE  Take 1-2 tablets (5-10 mg total) by mouth every 3 (three) hours as needed for pain.     potassium chloride SA 20 MEQ tablet  Commonly known as:  K-DUR,KLOR-CON  Take 1 tablet (20 mEq total) by mouth 2 (two) times daily.     simvastatin 20 MG tablet  Commonly known as:  ZOCOR  Take 1 tablet (20 mg total) by mouth at bedtime.     warfarin 5 MG tablet  Commonly known as:  COUMADIN  Take 1 tablet (5 mg total) by mouth daily. Or as directed by Cardiologist         1.Beta Blocker:  Yes [ x  ]                              No   [   ]                              If No, reason:  2.Ace Inhibitor/ARB: Yes [ x  ]                                     No  [    ]                                     If No, reason:  3.Statin:   Yes [  x ]                  No  [   ]                  If No, reason:  4.Ecasa:  Yes  [ x  ]  No   [   ]                  If No, reason:    Follow Up Appointments: Follow-up Information   Follow up with Runell Gess, MD. (Call for a follow up appointment for 2 weeks)    Contact information:   [address] [place] [address] [phone]       Follow up with Sheliah Plane B, MD. (PA/LAT CXR to be taken (at [facility] which is in the same building as Dr. Dennie Maizes office) on 11/11/2012 at 12:30 pm;Appointment with Dr. Dennie Maizes physician assistant is on 11/11/2012 at 1:30 pm)    Contact information:   [address] [employer] [address] [place]  [address] [phone]       Schedule an appointment as soon as possible for a visit with Barbaraann Share, MD.   Contact information:   [address] [place] [address] [phone]       Please follow up. (Please have bloodwork (PT/INR) drawn for Coumadin management on Monday, 10/21/2012 and call results to Dr. Hazle Coca office for dosing)      Signed: Doree Fudge MPA-C 10/16/2012, 2:32 PM

## 2012-10-11 NOTE — Plan of Care (Signed)
Problem: Phase III Progression Outcomes Goal: Time patient transferred to PCTU/Telemetry POD Outcome: Completed/Met Date Met:  10/11/12 1036 10/11/12- transfer to 2018 via WC. Portable monitor on. No changes.

## 2012-10-11 NOTE — Progress Notes (Signed)
Upon hourly rounding, patient was encouraged to void.  Patient tried and was unsuccessful.  I explained to the patient that I would bladder scan to see how much urine was present in his bladder, he agreed.  Bladder scan was done, approx 535 cc of urine.  Explained to the patient why it was important to check his urine output and he understood, I said that I would give him another hour before intervening.  Patient was understanding, appropriate, and very cooperative.  Will continue to re-assess.

## 2012-10-12 ENCOUNTER — Inpatient Hospital Stay (HOSPITAL_COMMUNITY): Payer: Medicaid Other

## 2012-10-12 LAB — DIGOXIN LEVEL: Digoxin Level: 0.5 ng/mL — ABNORMAL LOW (ref 0.8–2.0)

## 2012-10-12 LAB — BASIC METABOLIC PANEL
BUN: 22 mg/dL (ref 6–23)
CO2: 25 mEq/L (ref 19–32)
Calcium: 8.3 mg/dL — ABNORMAL LOW (ref 8.4–10.5)
Chloride: 101 mEq/L (ref 96–112)
Creatinine, Ser: 0.85 mg/dL (ref 0.50–1.35)
GFR calc Af Amer: >90 mL/min (ref >90)
GFR calc non Af Amer: >90 mL/min (ref >90)
Glucose, Bld: 99 mg/dL (ref 70–99)
Potassium: 4.2 mEq/L (ref 3.5–5.1)
Sodium: 135 mEq/L (ref 135–145)

## 2012-10-12 LAB — CBC
HCT: 29.2 % — ABNORMAL LOW (ref 39.0–52.0)
Hemoglobin: 9.9 g/dL — ABNORMAL LOW (ref 13.0–17.0)
MCH: 30.7 pg (ref 26.0–34.0)
MCHC: 33.9 g/dL (ref 30.0–36.0)
MCV: 90.4 fL (ref 78.0–100.0)
Platelets: 154 10*3/uL (ref 150–400)
RBC: 3.23 MIL/uL — ABNORMAL LOW (ref 4.22–5.81)
RDW: 15.1 % (ref 11.5–15.5)
WBC: 7.4 10*3/uL (ref 4.0–10.5)

## 2012-10-12 LAB — PROTIME-INR
INR: 1.1 (ref 0.00–1.49)
Prothrombin Time: 14.1 seconds (ref 11.6–15.2)

## 2012-10-12 MED ORDER — WARFARIN SODIUM 2.5 MG PO TABS
2.5000 mg | ORAL_TABLET | Freq: Every day | ORAL | Status: DC
  Administered 2012-10-12: 2.5 mg via ORAL
  Filled 2012-10-12 (×2): qty 1

## 2012-10-12 NOTE — Progress Notes (Signed)
CARDIAC REHAB PHASE I   PRE:  Rate/Rhythm: 99 afib  BP:  Sitting: 104/64   SaO2: 97 2L  MODE:  Ambulation: 350 ft   POST:  Rate/Rhythem: 118 afib  BP:  Sitting: 118/46   SaO2: 94-97 RA  Pt ambulated 350 ft with assist x1 using rolling walker.  Pt needed assist x2 to get out of chair, but stable once up.  Pt ambulated on RA with only one standing rest break for SOB and fatigue.  Pt encouraged to continue to walk with staff over weekend.  We will follow up on Monday. Fabio Pierce, MA, ACSM RCEP [phone]  [name]

## 2012-10-12 NOTE — Evaluation (Signed)
Physical Therapy Evaluation Patient Details Name: [name] MRN: [id number] DOB: 23-Sep-1950 Today's Date: 10/12/2012 Time: 1191-4782 PT Time Calculation (min): 24 min  PT Assessment / Plan / Recommendation Clinical Impression  Pt is a 62 y.o male who presented to [facility] intially with ARF, patient has since undergone CABG.  Patient demonstrates deficits in functional mobility as indicated below in reassessment.  Feel patient will continue to benefit from skilled PT to address deficits and maximize function.  Will continue to see as indicated with HHPT recommended upon discharge. Spoke with patient at length regarding sternal precautions in place.    PT Assessment  Patient needs continued PT services    Follow Up Recommendations  Home health PT;Supervision/Assistance - 24 hour    Does the patient have the potential to tolerate intense rehabilitation      Barriers to Discharge        Equipment Recommendations  Rolling walker with 5" wheels    Recommendations for Other Services     Frequency Min 3X/week    Precautions / Restrictions Precautions Precautions: Sternal Precaution Comments: educated patient on sternal precautions and techniques for following with mobility Restrictions Weight Bearing Restrictions: No (sternal precautions)   Pertinent Vitals/Pain 3/10 incisional pain      Mobility  Bed Mobility Bed Mobility: Supine to Sit;Sitting - Scoot to Edge of Bed Supine to Sit: 3: Mod assist Sitting - Scoot to Edge of Bed: 4: Min guard Details for Bed Mobility Assistance: VCs for sternal precautions and technique with body positioning for leverage Transfers Transfers: Sit to Stand;Stand to Sit Sit to Stand: 3: Mod assist;From bed Stand to Sit: 4: Min assist;To chair/3-in-1 Details for Transfer Assistance: VCs for sternal precautions with use of rocking technique for momentum.  Ambulation/Gait Ambulation/Gait Assistance: 5: Supervision;4: Min guard Ambulation Distance  (Feet): 160 Feet Assistive device: Rolling walker Ambulation/Gait Assistance Details: One standing rest break; ambulated on 3 liters oxygen with desaturation to 86%, VCs for controlled PL breathing Gait velocity: decreased General Gait Details: Patient steady with ambulation today Stairs: No    Exercises General Exercises - Lower Extremity Ankle Circles/Pumps: AROM;Both;10 reps   PT Diagnosis: Difficulty walking  PT Problem List: Decreased strength;Decreased activity tolerance;Decreased mobility;Decreased knowledge of precautions;Cardiopulmonary status limiting activity;Pain PT Treatment Interventions: DME instruction;Gait training;Stair training;Functional mobility training;Therapeutic activities;Therapeutic exercise;Balance training;Neuromuscular re-education   PT Goals Acute Rehab PT Goals PT Goal Formulation: With patient Time For Goal Achievement: 10/07/12 Potential to Achieve Goals: Good Pt will go Supine/Side to Sit: with modified independence PT Goal: Supine/Side to Sit - Progress: Goal set today Pt will go Sit to Stand: with modified independence PT Goal: Sit to Stand - Progress: Goal set today Pt will go Stand to Sit: with modified independence PT Goal: Stand to Sit - Progress: Goal set today Pt will Stand: Independently PT Goal: Stand - Progress: Goal set today Pt will Ambulate: >150 feet;with least restrictive assistive device;with modified independence PT Goal: Ambulate - Progress: Goal set today Pt will Go Up / Down Stairs: 1-2 stairs;with modified independence;with least restrictive assistive device PT Goal: Up/Down Stairs - Progress: Goal set today  Visit Information  Last PT Received On: 10/12/12 Assistance Needed: +1    Subjective Data  Subjective: I just have some pain in my chest Patient Stated Goal: To get better and eventually get home.   Prior Functioning  Home Living Lives With: Alone Available Help at Discharge: Family;Available 24 hours/day Type  of Home: Apartment Home Access: Stairs to enter [employer]  of Steps: 2 Entrance Stairs-Rails: None Home Layout: One level Bathroom Shower/Tub: Engineer, manufacturing systems: Standard Bathroom Accessibility: No Home Adaptive Equipment: Walker - rolling Prior Function Level of Independence: Independent Able to Take Stairs?: Yes Driving: Yes Vocation: Retired Musician: No difficulties Dominant Hand: Right    Cognition  Cognition Arousal/Alertness: Awake/alert Behavior During Therapy: WFL for tasks assessed/performed Overall Cognitive Status: Within Functional Limits for tasks assessed    Extremity/Trunk Assessment Right Upper Extremity Assessment RUE ROM/Strength/Tone: Deficits;Unable to fully assess;Due to pain;Due to precautions RUE ROM/Strength/Tone Deficits: sternal precautions Left Upper Extremity Assessment LUE ROM/Strength/Tone: Deficits;Unable to fully assess;Due to pain;Due to precautions LUE ROM/Strength/Tone Deficits: sternal precautions Right Lower Extremity Assessment RLE ROM/Strength/Tone: Within functional levels Left Lower Extremity Assessment LLE ROM/Strength/Tone: Within functional levels Trunk Assessment Trunk Assessment: Normal   Balance Balance Balance Assessed: Yes Static Sitting Balance Static Sitting - Balance Support: Bilateral upper extremity supported Static Sitting - Level of Assistance: 7: Independent  End of Session PT - End of Session Equipment Utilized During Treatment: Gait belt;Oxygen Activity Tolerance: Patient tolerated treatment well Patient left: in chair;with call bell/phone within reach;with family/visitor present Nurse Communication: Mobility status  GP     [name] 10/12/2012, 1:49 PM Charlotte Crumb, PT DPT  [phone]

## 2012-10-12 NOTE — Progress Notes (Signed)
Subjective:  C/O mild SOB, denies CP  Objective:  Temp:  [98 F (36.7 C)-98.9 F (37.2 C)] 98.3 F (36.8 C) (06/21 0359) Pulse Rate:  [77-140] 77 (06/21 0359) Resp:  [18-27] 18 (06/21 0359) BP: (97-121)/(40-87) 97/56 mmHg (06/21 0359) SpO2:  [91 %-100 %] 100 % (06/21 0359) Weight:  [197 lb 8.5 oz (89.6 kg)] 197 lb 8.5 oz (89.6 kg) (06/21 0359) Weight change:   Intake/Output from previous day: 06/20 0701 - 06/21 0700 In: 500 [P.O.:480; I.V.:20] Out: 800 [Urine:800]  Intake/Output from this shift:    Physical Exam: General appearance: alert and mild distress Neck: no adenopathy, no carotid bruit, no JVD, supple, symmetrical, trachea midline and thyroid not enlarged, symmetric, no tenderness/mass/nodules Lungs: clear to auscultation bilaterally Heart: irregularly irregular rhythm Extremities: 2+ pitting edema  Lab Results: Results for orders placed during the hospital encounter of 09/16/12 (from the past 48 hour(s))  GLUCOSE, CAPILLARY     Status: None   Collection Time    10/10/12  8:00 AM      Result Value Range   Glucose-Capillary 99  70 - 99 mg/dL  GLUCOSE, CAPILLARY     Status: None   Collection Time    10/10/12 11:42 AM      Result Value Range   Glucose-Capillary 83  70 - 99 mg/dL  GLUCOSE, CAPILLARY     Status: Abnormal   Collection Time    10/10/12  4:04 PM      Result Value Range   Glucose-Capillary 123 (*) 70 - 99 mg/dL   Comment 1 Notify RN    GLUCOSE, CAPILLARY     Status: Abnormal   Collection Time    10/10/12  8:08 PM      Result Value Range   Glucose-Capillary 115 (*) 70 - 99 mg/dL   Comment 1 Documented in Chart     Comment 2 Notify RN    GLUCOSE, CAPILLARY     Status: None   Collection Time    10/10/12 11:39 PM      Result Value Range   Glucose-Capillary 86  70 - 99 mg/dL   Comment 1 Documented in Chart     Comment 2 Notify RN    GLUCOSE, CAPILLARY     Status: None   Collection Time    10/11/12  3:33 AM      Result Value Range   Glucose-Capillary 80  70 - 99 mg/dL   Comment 1 Documented in Chart     Comment 2 Notify RN    BASIC METABOLIC PANEL     Status: None   Collection Time    10/11/12  5:00 AM      Result Value Range   Sodium 135  135 - 145 mEq/L   Potassium 4.0  3.5 - 5.1 mEq/L   Chloride 101  96 - 112 mEq/L   CO2 27  19 - 32 mEq/L   Glucose, Bld 73  70 - 99 mg/dL   BUN 20  6 - 23 mg/dL   Creatinine, Ser 2.13  0.50 - 1.35 mg/dL   Calcium 8.6  8.4 - 08.6 mg/dL   GFR calc non Af Amer >90  >90 mL/min   GFR calc Af Amer >90  >90 mL/min   Comment:            The eGFR has been calculated     using the CKD EPI equation.     This calculation has not been  validated in all clinical     situations.     eGFR's persistently     <90 mL/min signify     possible Chronic Kidney Disease.  PROTIME-INR     Status: None   Collection Time    10/11/12  5:00 AM      Result Value Range   Prothrombin Time 13.1  11.6 - 15.2 seconds   INR 1.00  0.00 - 1.49  CBC     Status: Abnormal   Collection Time    10/11/12  5:00 AM      Result Value Range   WBC 6.9  4.0 - 10.5 K/uL   RBC 2.96 (*) 4.22 - 5.81 MIL/uL   Hemoglobin 8.9 (*) 13.0 - 17.0 g/dL   HCT 16.1 (*) 09.6 - 04.5 %   MCV 89.5  78.0 - 100.0 fL   MCH 30.1  26.0 - 34.0 pg   MCHC 33.6  30.0 - 36.0 g/dL   RDW 40.9  81.1 - 91.4 %   Platelets 134 (*) 150 - 400 K/uL  GLUCOSE, CAPILLARY     Status: None   Collection Time    10/11/12  7:47 AM      Result Value Range   Glucose-Capillary 72  70 - 99 mg/dL  GLUCOSE, CAPILLARY     Status: None   Collection Time    10/11/12 11:54 AM      Result Value Range   Glucose-Capillary 81  70 - 99 mg/dL   Comment 1 Notify RN    CBC     Status: Abnormal   Collection Time    10/12/12  6:00 AM      Result Value Range   WBC 7.4  4.0 - 10.5 K/uL   RBC 3.23 (*) 4.22 - 5.81 MIL/uL   Hemoglobin 9.9 (*) 13.0 - 17.0 g/dL   HCT 78.2 (*) 95.6 - 21.3 %   MCV 90.4  78.0 - 100.0 fL   MCH 30.7  26.0 - 34.0 pg   MCHC 33.9  30.0 -  36.0 g/dL   RDW 08.6  57.8 - 46.9 %   Platelets 154  150 - 400 K/uL  PROTIME-INR     Status: None   Collection Time    10/12/12  6:00 AM      Result Value Range   Prothrombin Time 14.1  11.6 - 15.2 seconds   INR 1.10  0.00 - 1.49    Imaging: Imaging results have been reviewed  Assessment/Plan:   1. Principal Problem: 2.   Acute respiratory failure, with BiPAP needed on admit 3. Active Problems: 4.   PAF (paroxysmal atrial fibrillation), he has had periods of NSR since admission 5.   HTN (hypertension) 6.   Hyperlipidemia 7.   Noncompliance, no meds for two weeks prior to admission 8.   Tobacco use 9.   Acute combined systolic and diastolic CHF, NYHA class 3 -- previous normal LV function and moderate AS- re-evaluate; EF now 30-35% with regional WMA 10.   Bilateral lower extremity edema, secondary to acute CHF 11.   Aortic stenosis, severe 12.   CAD, RCA/PDA, 30-40% LM at cath 09/20/12 13.   Cellulitis, lower extremity- treated 14.   PAD (peripheral artery disease), decreased bil. ABIs 15.   Chronic obstructive pulmonary disease 16.   Cardiomyopathy, ischemic- 30-35% 2D  17.   Gout flare. 09/29/12, Lt knee, improved with colchicine. 18.   Time Spent Directly with Patient:  20 minutes  Length of  Stay:  LOS: 26 days   POD #4 CABG X 2 AVR. In AF this AM with CVR on IV Amio. C/O increased SOB. He has 2+ pitting edema. Otherwise exam benign. May want to increase diuretic dose or change to IV for now. Ambulate. [facility]. On coumadin AC.  Runell Gess 10/12/2012, 7:17 AM

## 2012-10-12 NOTE — Progress Notes (Signed)
Pt ambulated 450 ft with walker and nurse. Stopped x 3 to rest. Tolerated fair. [name]

## 2012-10-12 NOTE — Progress Notes (Signed)
Pt weaned to RA. 02 sustained 95-99%. Pt tolerating well. Will continue to monitor.

## 2012-10-12 NOTE — Progress Notes (Addendum)
      [address]       [place] [address]             [phone]        4 Days Post-Op Procedure(s) (LRB): CORONARY ARTERY BYPASS GRAFTING (CABG) (N/A) AORTIC VALVE REPLACEMENT (AVR) (N/A) MAZE (N/A) INTRAOPERATIVE TRANSESOPHAGEAL ECHOCARDIOGRAM (N/A) ENDOVEIN HARVEST OF GREATER SAPHENOUS VEIN (Bilateral)  Subjective: Patient felt a fib yesterday. He also was short of breath when it occurred. Feeling better this am  Objective: Vital signs in last 24 hours: Temp:  [98.3 F (36.8 C)-98.9 F (37.2 C)] 98.3 F (36.8 C) (06/21 0359) Pulse Rate:  [77-140] 77 (06/21 0359) Cardiac Rhythm:  [-] Atrial fibrillation (06/21 0359) Resp:  [18-22] 18 (06/21 0359) BP: (97-121)/(40-87) 97/56 mmHg (06/21 0359) SpO2:  [91 %-100 %] 100 % (06/21 0359) Weight:  [89.6 kg (197 lb 8.5 oz)] 89.6 kg (197 lb 8.5 oz) (06/21 0359)  Pre op weight  84.7 kg Current Weight  10/12/12 89.6 kg (197 lb 8.5 oz)      Intake/Output from previous day: 06/20 0701 - 06/21 0700 In: 500 [P.O.:480; I.V.:20] Out: 800 [Urine:800]   Physical Exam:  Cardiovascular: IRRR IRRR Pulmonary: Coarse breath sounds Abdomen: Soft, non tender, bowel sounds present. Extremities: Bilateral lower extremity edema;ecchymosis bilateral thighs L>R Wounds: Lower extremity wounds are clean and dry.  No erythema or signs of infection. Sternal dressing is clean and dry  Lab Results: CBC: Recent Labs  10/11/12 0500 10/12/12 0600  WBC 6.9 7.4  HGB 8.9* 9.9*  HCT 26.5* 29.2*  PLT 134* 154   BMET:  Recent Labs  10/11/12 0500 10/12/12 0600  NA 135 135  K 4.0 4.2  CL 101 101  CO2 27 25  GLUCOSE 73 99  BUN 20 22  CREATININE 0.75 0.85  CALCIUM 8.6 8.3*    PT/INR:  Lab Results  Component Value Date   INR 1.10 10/12/2012   INR 1.00 10/11/2012   INR 1.29 10/08/2012   ABG:  INR: Will add last result for INR, ABG once components are confirmed Will add last 4 CBG results once components are  confirmed  Assessment/Plan:  1. CV - Went into a fib with RVR yesterday early evening. In a fib with CVR this am. On Amiodarone gttp, Coreg 6.25 bid, Digoxin 0.125 daily, and Lisinopril 2.5 daily. Continue Coumadin 2.5 daily. Likely stop drip and start po Amiodarone later today-per cardiology. Will hold Lisinopril this am as SBP labile. 2.  Pulmonary - On 5 liters of oxygen via [place] as tolerates.Encourage incentive spirometer 3. Volume Overload - Continue daily Lasix 4.  Acute blood loss anemia - H and H up to 9.9 and 29.2   ZIMMERMAN,DONIELLE MPA-C 10/12/2012,8:35 AM  Patient seen and examined. Agree with above. Continue IV amiodarone for now- may change to PO tomorrow

## 2012-10-13 MED ORDER — FUROSEMIDE 10 MG/ML IJ SOLN
40.0000 mg | Freq: Once | INTRAMUSCULAR | Status: AC
  Administered 2012-10-13: 40 mg via INTRAVENOUS
  Filled 2012-10-13: qty 4

## 2012-10-13 MED ORDER — COLCHICINE 0.6 MG PO TABS
0.6000 mg | ORAL_TABLET | Freq: Two times a day (BID) | ORAL | Status: DC
  Administered 2012-10-13 – 2012-10-18 (×11): 0.6 mg via ORAL
  Filled 2012-10-13 (×12): qty 1

## 2012-10-13 MED ORDER — AMIODARONE LOAD VIA INFUSION
150.0000 mg | Freq: Once | INTRAVENOUS | Status: AC
  Administered 2012-10-13: 150 mg via INTRAVENOUS
  Filled 2012-10-13: qty 83.34

## 2012-10-13 MED ORDER — WARFARIN SODIUM 5 MG PO TABS
5.0000 mg | ORAL_TABLET | Freq: Every day | ORAL | Status: DC
  Administered 2012-10-13 – 2012-10-15 (×3): 5 mg via ORAL
  Filled 2012-10-13 (×4): qty 1

## 2012-10-13 MED ORDER — AMIODARONE IV BOLUS ONLY 150 MG/100ML: 150.0000 mg | Freq: Once | INTRAVENOUS | Status: DC

## 2012-10-13 MED ORDER — FUROSEMIDE 40 MG PO TABS
40.0000 mg | ORAL_TABLET | Freq: Every day | ORAL | Status: DC
  Filled 2012-10-13: qty 1

## 2012-10-13 MED ORDER — FUROSEMIDE 40 MG PO TABS: 40.0000 mg | ORAL_TABLET | Freq: Every day | ORAL | Status: DC

## 2012-10-13 MED ORDER — POTASSIUM CHLORIDE CRYS ER 20 MEQ PO TBCR
20.0000 meq | EXTENDED_RELEASE_TABLET | Freq: Once | ORAL | Status: AC
  Administered 2012-10-13: 20 meq via ORAL

## 2012-10-13 NOTE — Progress Notes (Signed)
Pt refuses to walk, states gout is acting up and he is in pain. Will offer again this evening.

## 2012-10-13 NOTE — Progress Notes (Signed)
Patient's knee still very painful. Patient not wanting to ambulate this evening. [name]

## 2012-10-13 NOTE — Progress Notes (Addendum)
      [address]       [place] [address]             [phone]        5 Days Post-Op Procedure(s) (LRB): CORONARY ARTERY BYPASS GRAFTING (CABG) (N/A) AORTIC VALVE REPLACEMENT (AVR) (N/A) MAZE (N/A) INTRAOPERATIVE TRANSESOPHAGEAL ECHOCARDIOGRAM (N/A) ENDOVEIN HARVEST OF GREATER SAPHENOUS VEIN (Bilateral)  Subjective: Patient with complaints of painful left knee.  Objective: Vital signs in last 24 hours: Temp:  [98.3 F (36.8 C)-99.4 F (37.4 C)] 98.4 F (36.9 C) (06/22 0411) Pulse Rate:  [57-108] 57 (06/22 0411) Cardiac Rhythm:  [-] Atrial fibrillation (06/22 0755) Resp:  [18-20] 20 (06/22 0411) BP: (83-118)/(49-86) 98/61 mmHg (06/22 0411) SpO2:  [92 %-98 %] 92 % (06/22 0411) Weight:  [89.9 kg (198 lb 3.1 oz)] 89.9 kg (198 lb 3.1 oz) (06/22 0730)  Pre op weight  84.7 kg Current Weight  10/13/12 89.9 kg (198 lb 3.1 oz)      Intake/Output from previous day: 06/21 0701 - 06/22 0700 In: 683.7 [I.V.:683.7] Out: 475 [Urine:475]   Physical Exam:  Cardiovascular: IRRR IRRR Pulmonary: Coarse breath sounds Abdomen: Soft, non tender, bowel sounds present. Extremities: Bilateral lower extremity edema;ecchymosis bilateral thighs L>R. Left knee swollen and warm to touch Wounds: Lower extremity wounds are clean and dry.  No erythema or signs of infection. Sternal dressing is clean and dry  Lab Results: CBC:  Recent Labs  10/11/12 0500 10/12/12 0600  WBC 6.9 7.4  HGB 8.9* 9.9*  HCT 26.5* 29.2*  PLT 134* 154   BMET:   Recent Labs  10/11/12 0500 10/12/12 0600  NA 135 135  K 4.0 4.2  CL 101 101  CO2 27 25  GLUCOSE 73 99  BUN 20 22  CREATININE 0.75 0.85  CALCIUM 8.6 8.3*    PT/INR:  Lab Results  Component Value Date   INR 1.10 10/12/2012   INR 1.00 10/11/2012   INR 1.29 10/08/2012   ABG:  INR: Will add last result for INR, ABG once components are confirmed Will add last 4 CBG results once components are  confirmed  Assessment/Plan:  1. CV - Went into a fib with RVR 6/21 early evening. In a fib with HR 110's this am. On Amiodarone gttp, Coreg 6.25 bid, Digoxin 0.125 daily, and Lisinopril 2.5 daily. Will discuss with cardiology regarding better a fib rate control.SBP more labile last 24 hours so will will stop Lisinopril. Continue Coumadin 2.5 daily. INR not drawn today. Will order and order Coumadin accordingly. 2.  Pulmonary - Was on 4-5 liters of oxygen via [place] yesterday. Weaned to room air.Encourage incentive spirometer 3. Volume Overload - Will give Lasix IV this am 4.  Acute blood loss anemia - H and H up to 9.9 and 29.2 5.Gout left knee-colchicine 6.Cont PT 7.Remove EPW in am  ZIMMERMAN,DONIELLE MPA-C 10/13/2012,8:15 AM  Patient seen and examined Agree with above Still in a fib and rate still running up to 120 periodically- will bolus with amiodarone and continue gtt today INR still hasn't moved - will give 5 mg today

## 2012-10-13 NOTE — Progress Notes (Signed)
Subjective:  Still C/O SOB as well as Left kneee pain  Objective:  Temp:  [98.3 F (36.8 C)-99.4 F (37.4 C)] 98.4 F (36.9 C) (06/22 0411) Pulse Rate:  [57-108] 57 (06/22 0411) Resp:  [18-20] 20 (06/22 0411) BP: (83-118)/(49-86) 98/61 mmHg (06/22 0411) SpO2:  [92 %-98 %] 92 % (06/22 0411) Weight:  [198 lb 3.1 oz (89.9 kg)] 198 lb 3.1 oz (89.9 kg) (06/22 0730) Weight change:   Intake/Output from previous day: 06/21 0701 - 06/22 0700 In: 683.7 [I.V.:683.7] Out: 475 [Urine:475]  Intake/Output from this shift:    Physical Exam: General appearance: alert and no distress Neck: no adenopathy, no carotid bruit, no JVD, supple, symmetrical, trachea midline and thyroid not enlarged, symmetric, no tenderness/mass/nodules Lungs: clear to auscultation bilaterally Heart: irregularly irregular rhythm and soft outflow murmur Extremities: edema 1+ BLE edema  Lab Results: Results for orders placed during the hospital encounter of 09/16/12 (from the past 48 hour(s))  GLUCOSE, CAPILLARY     Status: None   Collection Time    10/11/12 11:54 AM      Result Value Range   Glucose-Capillary 81  70 - 99 mg/dL   Comment 1 Notify RN    CBC     Status: Abnormal   Collection Time    10/12/12  6:00 AM      Result Value Range   WBC 7.4  4.0 - 10.5 K/uL   RBC 3.23 (*) 4.22 - 5.81 MIL/uL   Hemoglobin 9.9 (*) 13.0 - 17.0 g/dL   HCT 47.8 (*) 29.5 - 62.1 %   MCV 90.4  78.0 - 100.0 fL   MCH 30.7  26.0 - 34.0 pg   MCHC 33.9  30.0 - 36.0 g/dL   RDW 30.8  65.7 - 84.6 %   Platelets 154  150 - 400 K/uL  BASIC METABOLIC PANEL     Status: Abnormal   Collection Time    10/12/12  6:00 AM      Result Value Range   Sodium 135  135 - 145 mEq/L   Potassium 4.2  3.5 - 5.1 mEq/L   Chloride 101  96 - 112 mEq/L   CO2 25  19 - 32 mEq/L   Glucose, Bld 99  70 - 99 mg/dL   BUN 22  6 - 23 mg/dL   Creatinine, Ser 9.62  0.50 - 1.35 mg/dL   Calcium 8.3 (*) 8.4 - 10.5 mg/dL   GFR calc non Af Amer >90  >90 mL/min    GFR calc Af Amer >90  >90 mL/min   Comment:            The eGFR has been calculated     using the CKD EPI equation.     This calculation has not been     validated in all clinical     situations.     eGFR's persistently     <90 mL/min signify     possible Chronic Kidney Disease.  PROTIME-INR     Status: None   Collection Time    10/12/12  6:00 AM      Result Value Range   Prothrombin Time 14.1  11.6 - 15.2 seconds   INR 1.10  0.00 - 1.49  DIGOXIN LEVEL     Status: Abnormal   Collection Time    10/12/12  6:00 AM      Result Value Range   Digoxin Level 0.5 (*) 0.8 - 2.0 ng/mL    Imaging: Imaging  results have been reviewed  Assessment/Plan:   1. Principal Problem: 2.   Acute respiratory failure, with BiPAP needed on admit 3. Active Problems: 4.   PAF (paroxysmal atrial fibrillation), he has had periods of NSR since admission 5.   HTN (hypertension) 6.   Hyperlipidemia 7.   Noncompliance, no meds for two weeks prior to admission 8.   Tobacco use 9.   Acute combined systolic and diastolic CHF, NYHA class 3 -- previous normal LV function and moderate AS- re-evaluate; EF now 30-35% with regional WMA 10.   Bilateral lower extremity edema, secondary to acute CHF 11.   Aortic stenosis, severe 12.   CAD, RCA/PDA, 30-40% LM at cath 09/20/12 13.   Cellulitis, lower extremity- treated 14.   PAD (peripheral artery disease), decreased bil. ABIs 15.   Chronic obstructive pulmonary disease 16.   Cardiomyopathy, ischemic- 30-35% 2D  17.   Gout flare. 09/29/12, Lt knee, improved with colchicine. 18.   Time Spent Directly with Patient:  20 minutes  Length of Stay:  LOS: 27 days   POD #5 Bioprosthetic AVR, CABG, MAZE, LAA clip. In Afib with RVR on BB , Dig and IV amio (can change to PO 400 mg BID). Still volume overloaded with 1-2+ pitting edema. UOP decreased. May want to increase lasix to 40 mg PO TID. Walking with [facility]. On coumadin AC. INR sub therapeutic. Making slow  improvement.  Runell Gess 10/13/2012, 8:47 AM

## 2012-10-14 LAB — BASIC METABOLIC PANEL
Chloride: 95 mEq/L — ABNORMAL LOW (ref 96–112)
Creatinine, Ser: 1.02 mg/dL (ref 0.50–1.35)
GFR calc Af Amer: 88 mL/min — ABNORMAL LOW (ref >90)
Potassium: 5 mEq/L (ref 3.5–5.1)

## 2012-10-14 LAB — PROTIME-INR
INR: 1.2 (ref 0.00–1.49)
Prothrombin Time: 15 seconds (ref 11.6–15.2)

## 2012-10-14 MED ORDER — FUROSEMIDE 10 MG/ML IJ SOLN
40.0000 mg | Freq: Two times a day (BID) | INTRAMUSCULAR | Status: DC
  Administered 2012-10-14 – 2012-10-17 (×8): 40 mg via INTRAVENOUS
  Filled 2012-10-14 (×11): qty 4

## 2012-10-14 MED ORDER — AMIODARONE HCL 200 MG PO TABS
400.0000 mg | ORAL_TABLET | Freq: Two times a day (BID) | ORAL | Status: DC
  Administered 2012-10-14 – 2012-10-18 (×9): 400 mg via ORAL
  Filled 2012-10-14 (×10): qty 2

## 2012-10-14 NOTE — Progress Notes (Signed)
CARDIAC REHAB PHASE I   PRE:  Rate/Rhythm: 78 SR    BP: sitting 98/50    SaO2: 93 1L  MODE:  Ambulation: 150 ft   POST:  Rate/Rhythm: 91 SR    BP: sitting 97/43     SaO2: 98-100 RA, ear   Pt used RW and gait belt.  C/o L knee pain due to gout which made him more unsteady and pain caused an increase in use of arms during walking.  Required 2-3 rest periods due to exertion and SOB.  SaO2 remained good during walk.  Sat pt in recliner with feet propped up. [phone]  Elissa Lovett Narberth CES, ACSM 10/14/2012 2:29 PM

## 2012-10-14 NOTE — Progress Notes (Signed)
Subjective: Breathing up and down  Objective: Vital signs in last 24 hours: Temp:  [98.2 F (36.8 C)-99.5 F (37.5 C)] 98.3 F (36.8 C) (06/23 0551) Pulse Rate:  [81-110] 81 (06/23 1000) Resp:  [18-20] 18 (06/23 0551) BP: (92-125)/(44-79) 107/44 mmHg (06/23 1000) SpO2:  [93 %-94 %] 94 % (06/23 0551) Weight:  [202 lb 6.4 oz (91.808 kg)] 202 lb 6.4 oz (91.808 kg) (06/23 0551) Weight change:  Last BM Date: 10/12/12 Intake/Output from previous day: -1100  Wt 202.6 up from yesterday at 198 06/22 0701 - 06/23 0700 In: -  Out: 1100 [Urine:1100] Intake/Output this shift:    PE: General:Pleasant affect, NAD Skin:Warm and dry, brisk capillary refill Heart:S1S2 RRR without murmur, gallup, rub or click Lungs:clear without rales, rhonchi, or wheezes though diminished, seems SOB with talking. ZOX:WRUE, non tender, + BS, do not palpate liver spleen or masses Ext:2-3+ lower ext edema  Neuro:alert and oriented, MAE, follows commands    Lab Results:  Recent Labs  10/12/12 0600  WBC 7.4  HGB 9.9*  HCT 29.2*  PLT 154   BMET  Recent Labs  10/12/12 0600 10/14/12 0455  NA 135 130*  K 4.2 5.0  CL 101 95*  CO2 25 28  GLUCOSE 99 122*  BUN 22 28*  CREATININE 0.85 1.02  CALCIUM 8.3* 8.3*   No results found for this basename: TROPONINI, CK, MB,  in the last 72 hours  Lab Results  Component Value Date   CHOL 145 09/17/2012   HDL 47 09/17/2012   LDLCALC 83 09/17/2012   TRIG 77 09/17/2012   CHOLHDL 3.1 09/17/2012   Lab Results  Component Value Date   HGBA1C 5.2 10/07/2012     Lab Results  Component Value Date   TSH 1.994 09/17/2012      Studies/Results: Dg Chest 2 View  10/12/2012   *RADIOLOGY REPORT*  Clinical Data: Interval chest tube removal  CHEST - 2 VIEW  Comparison: 10/11/2012  Findings: Status post median sternotomy CABG procedure.  Normal heart size.  Small bilateral pleural effusions and mild pulmonary edema is again noted and appears similar to previous  exam.  There is subsegmental atelectasis noted the left base.  There is a significant pneumothorax identified after chest tube removal.  IMPRESSION:  1.  No change from previous exam.  Persistent small effusions and mild edema. 3.  No clinically significant pneumothorax identified status post chest tube removal.   Original Report Authenticated By: Signa Kell, M.D.    Medications: I have reviewed the patient's current medications. Scheduled Meds: . amiodarone  400 mg Oral BID  . aspirin EC  81 mg Oral Daily  . carvedilol  6.25 mg Oral BID WC  . colchicine  0.6 mg Oral BID  . digoxin  0.125 mg Oral Daily  . docusate sodium  200 mg Oral Daily  . furosemide  40 mg Intravenous Q12H  . pantoprazole  40 mg Oral QAC breakfast  . potassium chloride  20 mEq Oral Daily  . simvastatin  20 mg Oral q1800  . sodium chloride  3 mL Intravenous Q12H  . warfarin  5 mg Oral q1800  . Warfarin - Physician Dosing Inpatient   Does not apply q1800   Continuous Infusions:   PRN Meds:.sodium chloride, acetaminophen, bisacodyl, bisacodyl, levalbuterol, ondansetron (ZOFRAN) IV, ondansetron, oxyCODONE, sodium chloride, traMADol  Assessment/Plan: Principal Problem:   Acute respiratory failure, with BiPAP needed on admit Active Problems:   PAF (paroxysmal atrial fibrillation), he  has had periods of NSR since admission   Acute combined systolic and diastolic CHF, NYHA class 3 -- previous normal LV function and moderate AS- re-evaluate; EF now 30-35% with regional WMA   HTN (hypertension)   Hyperlipidemia   Tobacco use   Bilateral lower extremity edema, secondary to acute CHF   Noncompliance, no meds for two weeks prior to admission   Aortic stenosis, severe   CAD, RCA/PDA, 30-40% LM at cath 09/20/12   Cellulitis, lower extremity- treated   PAD (peripheral artery disease), decreased bil. ABIs   Chronic obstructive pulmonary disease   Cardiomyopathy, ischemic- 30-35% 2D    Gout flare. 09/29/12, Lt knee,  improved with colchicine.  PLAN:  POD #6  #5 Bioprosthetic AVR, CABG, MAZE, LAA clip.  Currently in SR.   INR 1.20   LOS: 28 days   Time spent with pt. :15 minutes. Marykay Lex  Nurse Practitioner Certified Pager [phone] 10/14/2012, 10:49 AM   I have seen and evaluated the patient this AM along with Nada Boozer, NP. I agree with her findings, examination as well as impression recommendations.  Converted to NSR - transitioned to PO Amiodarone (agree with completing load PO)., on stable dose of BB + Digoxin Still volume up - but previously diuresed extremely well -- on IV Lasix, but will need to monitor electrolytes closely -- Na slowly dropping (130 today) & K+ 5 -- hold replacement K today.  Awaiting therapeutic INR  EF 30-35% -- at least partially ischemic; BP would not tolerate ACE-I/ARB for afterload reduction.  Would hope that EF will improve -- needs f/u Echo in ~3 months.    ? If he would be a good candidate for [facility] on D/C, as his hospitalization has been quite long & he is now revascularized, in NSR & diuresed, perhaps we should recheck an echo prior to d/c to make a determination.  Will discuss with my colleagues, who have been following him more closely during his hospital course.  Glycemic control is good. On statin.    MD Time with pt: 10 min  HARDING,Deondrick W, M.D., M.S. [facility] [address] [place], [place]  [address]  [phone] Pager # [phone] 10/14/2012 10:49 AM

## 2012-10-14 NOTE — Progress Notes (Signed)
EPW removed, pt tolerated well with no ectopy noted. VSS, will monitor per protocol and continue to monitor pt closely.

## 2012-10-14 NOTE — Progress Notes (Signed)
Subjective: Breathing up and down  Objective: Vital signs in last 24 hours: Temp:  [98.2 F (36.8 C)-99.5 F (37.5 C)] 98.3 F (36.8 C) (06/23 0551) Pulse Rate:  [86-120] 86 (06/23 0551) Resp:  [18-20] 18 (06/23 0551) BP: (92-125)/(41-79) 109/54 mmHg (06/23 0551) SpO2:  [93 %-94 %] 94 % (06/23 0551) Weight:  [202 lb 6.4 oz (91.808 kg)] 202 lb 6.4 oz (91.808 kg) (06/23 0551) Weight change:  Last BM Date: 10/12/12 Intake/Output from previous day: -1100  Wt 202.6 up from yesterday at 198 06/22 0701 - 06/23 0700 In: -  Out: 1100 [Urine:1100] Intake/Output this shift:    PE: General:Pleasant affect, NAD Skin:Warm and dry, brisk capillary refill Heart:S1S2 RRR without murmur, gallup, rub or click Lungs:clear without rales, rhonchi, or wheezes though diminished, seems SOB with talking. JYN:WGNF, non tender, + BS, do not palpate liver spleen or masses Ext:2-3+ lower ext edema  Neuro:alert and oriented, MAE, follows commands    Lab Results:  Recent Labs  10/12/12 0600  WBC 7.4  HGB 9.9*  HCT 29.2*  PLT 154   BMET  Recent Labs  10/12/12 0600 10/14/12 0455  NA 135 130*  K 4.2 5.0  CL 101 95*  CO2 25 28  GLUCOSE 99 122*  BUN 22 28*  CREATININE 0.85 1.02  CALCIUM 8.3* 8.3*   No results found for this basename: TROPONINI, CK, MB,  in the last 72 hours  Lab Results  Component Value Date   CHOL 145 09/17/2012   HDL 47 09/17/2012   LDLCALC 83 09/17/2012   TRIG 77 09/17/2012   CHOLHDL 3.1 09/17/2012   Lab Results  Component Value Date   HGBA1C 5.2 10/07/2012     Lab Results  Component Value Date   TSH 1.994 09/17/2012      Studies/Results: Dg Chest 2 View  10/12/2012   *RADIOLOGY REPORT*  Clinical Data: Interval chest tube removal  CHEST - 2 VIEW  Comparison: 10/11/2012  Findings: Status post median sternotomy CABG procedure.  Normal heart size.  Small bilateral pleural effusions and mild pulmonary edema is again noted and appears similar to previous  exam.  There is subsegmental atelectasis noted the left base.  There is a significant pneumothorax identified after chest tube removal.  IMPRESSION:  1.  No change from previous exam.  Persistent small effusions and mild edema. 3.  No clinically significant pneumothorax identified status post chest tube removal.   Original Report Authenticated By: Signa Kell, M.D.    Medications: I have reviewed the patient's current medications. Scheduled Meds: . aspirin EC  81 mg Oral Daily  . carvedilol  6.25 mg Oral BID WC  . colchicine  0.6 mg Oral BID  . digoxin  0.125 mg Oral Daily  . docusate sodium  200 mg Oral Daily  . furosemide  40 mg Intravenous Q12H  . pantoprazole  40 mg Oral QAC breakfast  . potassium chloride  20 mEq Oral Daily  . simvastatin  20 mg Oral q1800  . sodium chloride  3 mL Intravenous Q12H  . warfarin  5 mg Oral q1800  . Warfarin - Physician Dosing Inpatient   Does not apply q1800   Continuous Infusions: . amiodarone (NEXTERONE PREMIX) 360 mg/200 mL dextrose 30 mg/hr (10/14/12 0701)   PRN Meds:.sodium chloride, acetaminophen, bisacodyl, bisacodyl, levalbuterol, ondansetron (ZOFRAN) IV, ondansetron, oxyCODONE, sodium chloride, traMADol  Assessment/Plan: Principal Problem:   Acute respiratory failure, with BiPAP needed on admit Active Problems:   PAF (  paroxysmal atrial fibrillation), he has had periods of NSR since admission   Acute combined systolic and diastolic CHF, NYHA class 3 -- previous normal LV function and moderate AS- re-evaluate; EF now 30-35% with regional WMA   Aortic stenosis, severe   CAD, RCA/PDA, 30-40% LM at cath 09/20/12   HTN (hypertension)   Hyperlipidemia   Noncompliance, no meds for two weeks prior to admission   Tobacco use   Bilateral lower extremity edema, secondary to acute CHF   Cellulitis, lower extremity- treated   PAD (peripheral artery disease), decreased bil. ABIs   Chronic obstructive pulmonary disease   Cardiomyopathy, ischemic-  30-35% 2D    Gout flare. 09/29/12, Lt knee, improved with colchicine.  PLAN:  POD #6  #5 Bioprosthetic AVR, CABG, MAZE, LAA clip.  Currently in SR.   INR 1.20   LOS: 28 days   Time spent with pt. :15 minutes. [facility] R  Nurse Practitioner Certified Pager [phone] 10/14/2012, 7:58 AM   I have seen and evaluated the patient this AM along with Nada Boozer, NP. I agree with her findings, examination as well as impression recommendations.  Converted to NSR - transitioned to PO Amiodarone (agree with completing load PO)., on stable dose of BB + Digoxin Still volume up - but previously diuresed extremely well -- on IV Lasix, but will need to monitor electrolytes closely -- Na slowly dropping (130 today) & K+ 5 -- hold replacement K today.  Awaiting therapeutic INR  EF 30-35% -- at least partially ischemic; BP would not tolerate ACE-I/ARB for afterload reduction.  Would hope that EF will improve -- needs f/u Echo in ~3 months.    ? If he would be a good candidate for LifeVest on D/C, as his hospitalization has been quite long & he is now revascularized, in NSR & diuresed, perhaps we should recheck an echo prior to d/c to make a determination.  Will discuss with my colleagues, who have been following him more closely during his hospital course.  Glycemic control is good.  MD Time with pt: 10 min  HARDING,Jaun W, M.D., M.S. [facility] [address] [place], [place]  [address]  [phone] Pager # [phone] 10/14/2012 10:39 AM

## 2012-10-14 NOTE — Progress Notes (Signed)
Physical Therapy Treatment Patient Details Name: [name] MRN: [id number] DOB: 01-25-1951 Today's Date: 10/14/2012 Time: 4098-1191 PT Time Calculation (min): 24 min  PT Assessment / Plan / Recommendation Comments on Treatment Session  Pt continues to demonstrate some deficits in functional mobility mainly limited by activity tolerance and left knee pain.  Will continue to progress activity as tolerated.    Follow Up Recommendations  Home health PT;Supervision/Assistance - 24 hour;SNF (if no 24 hour care would recommend SNF)     Does the patient have the potential to tolerate intense rehabilitation     Barriers to Discharge        Equipment Recommendations  Rolling walker with 5" wheels    Recommendations for Other Services    Frequency Min 3X/week   Plan Discharge plan remains appropriate;Frequency remains appropriate    Precautions / Restrictions Precautions Precautions: Sternal Precaution Comments: educated patient on sternal precautions and techniques for following with mobility Restrictions Weight Bearing Restrictions: No   Pertinent Vitals/Pain 7/10 knee pain (L) from gout    Mobility  Bed Mobility Bed Mobility: Supine to Sit;Sitting - Scoot to [place] of Bed;Sit to Supine Supine to Sit: 4: Min assist Sitting - Scoot to [place] of Bed: 4: Min guard Sit to Supine: 4: Min assist Details for Bed Mobility Assistance: VCs for sternal precautions and technique with body positioning for leverage Transfers Transfers: Sit to Stand;Stand to Sit Sit to Stand: 3: Mod assist;From bed Stand to Sit: 4: Min assist;To bed Details for Transfer Assistance: VCs for sternal precautions with use of rocking technique for momentum.  Ambulation/Gait Ambulation/Gait Assistance: 4: Min guard Ambulation Distance (Feet): 90 Feet Assistive device: Rolling walker Ambulation/Gait Assistance Details: ambulation limited by knee pain (gout) Gait Pattern: Step-through pattern;Antalgic;Trunk  flexed Gait velocity: decreased General Gait Details: Patient steady with ambulation today Stairs: No    Exercises General Exercises - Lower Extremity Ankle Circles/Pumps: AROM;Both;10 reps Long Arc Quad: AROM;Both;20 reps Other Exercises Other Exercises: performed multiple part task transfers from various surface to reinforce technique with sternal precautions (pt having difficulty secondary to knee pain)     PT Goals Acute Rehab PT Goals PT Goal Formulation: With patient Time For Goal Achievement: 10/07/12 Potential to Achieve Goals: Good Pt will go Supine/Side to Sit: with modified independence PT Goal: Supine/Side to Sit - Progress: Progressing toward goal Pt will go Sit to Stand: with modified independence PT Goal: Sit to Stand - Progress: Progressing toward goal Pt will go Stand to Sit: with modified independence PT Goal: Stand to Sit - Progress: Progressing toward goal Pt will Stand: Independently PT Goal: Stand - Progress: Progressing toward goal Pt will Ambulate: >150 feet;with least restrictive assistive device;with modified independence PT Goal: Ambulate - Progress: Progressing toward goal Pt will Go Up / Down Stairs: 1-2 stairs;with modified independence;with least restrictive assistive device  Visit Information       Subjective Data  Subjective: i just need to learn technique Patient Stated Goal: to get better   Cognition  Cognition Arousal/Alertness: Awake/alert Behavior During Therapy: WFL for tasks assessed/performed Overall Cognitive Status: Within Functional Limits for tasks assessed    Balance  Static Sitting Balance Static Sitting - Balance Support: Bilateral upper extremity supported Static Sitting - Level of Assistance: 7: Independent  End of Session PT - End of Session Equipment Utilized During Treatment: Gait belt;Oxygen Activity Tolerance: Patient limited by pain (from gout in knee) Patient left: in bed;with call bell/phone within  reach Nurse Communication: Mobility status  GP     Fabio Asa 10/14/2012, 10:52 AM Charlotte Crumb, PT DPT  [phone]

## 2012-10-14 NOTE — Progress Notes (Addendum)
      [address]       [place] [address]             [phone]      6 Days Post-Op Procedure(s) (LRB): CORONARY ARTERY BYPASS GRAFTING (CABG) (N/A) AORTIC VALVE REPLACEMENT (AVR) (N/A) MAZE (N/A) INTRAOPERATIVE TRANSESOPHAGEAL ECHOCARDIOGRAM (N/A) ENDOVEIN HARVEST OF GREATER SAPHENOUS VEIN (Bilateral)  Subjective: Mr. Stearns complains of gout pain in his left knee.  He states whatever medication they gave him at midnight last night helped provide some relief.   Objective: Vital signs in last 24 hours: Temp:  [98.2 F (36.8 C)-99.5 F (37.5 C)] 98.3 F (36.8 C) (06/23 0551) Pulse Rate:  [86-120] 86 (06/23 0551) Cardiac Rhythm:  [-] Atrial fibrillation (06/22 1950) Resp:  [18-20] 18 (06/23 0551) BP: (92-125)/(41-79) 109/54 mmHg (06/23 0551) SpO2:  [93 %-94 %] 94 % (06/23 0551) Weight:  [202 lb 6.4 oz (91.808 kg)] 202 lb 6.4 oz (91.808 kg) (06/23 0551)  Intake/Output from previous day: 06/22 0701 - 06/23 0700 In: -  Out: 1100 [Urine:1100]  General appearance: alert, cooperative and no distress Heart: regular rate and rhythm Lungs: clear to auscultation bilaterally Abdomen: soft, non-tender; bowel sounds normal; no masses,  no organomegaly Extremities: edema 2+ pitting Wound: clean and dry  Lab Results:  Recent Labs  10/12/12 0600  WBC 7.4  HGB 9.9*  HCT 29.2*  PLT 154   BMET:  Recent Labs  10/12/12 0600 10/14/12 0455  NA 135 130*  K 4.2 5.0  CL 101 95*  CO2 25 28  GLUCOSE 99 122*  BUN 22 28*  CREATININE 0.85 1.02  CALCIUM 8.3* 8.3*    PT/INR:  Recent Labs  10/14/12 0455  LABPROT 15.0  INR 1.20   ABG    Component Value Date/Time   PHART 7.356 10/08/2012 2336   HCO3 22.0 10/08/2012 2336   TCO2 23 10/09/2012 1650   ACIDBASEDEF 4.0* 10/08/2012 2336   O2SAT 92.0 10/08/2012 2336   CBG (last 3)   Recent Labs  10/11/12 1154  GLUCAP 81    Assessment/Plan: S/P Procedure(s) (LRB): CORONARY ARTERY BYPASS GRAFTING (CABG)  (N/A) AORTIC VALVE REPLACEMENT (AVR) (N/A) MAZE (N/A) INTRAOPERATIVE TRANSESOPHAGEAL ECHOCARDIOGRAM (N/A) ENDOVEIN HARVEST OF GREATER SAPHENOUS VEIN (Bilateral)  1.CV- previous Atrial Fibrillation, currently NSR- will transition IV Amiodarone to PO, continue Coreg, Digoxin 2. INR 1.20- will repeat Coumadin at 5 mg daily 3. Pulm-no acute issues 4. Gout- continue colchicine 5. Renal- creatinine WNL, remains volume overloaded weight up about 20 lbs, will continue IV Lasix 6. Dispo- patient slowly progressing, continue current care    LOS: 28 days    BARRETT, ERIN 10/14/2012  Now back in sinus Will need rehab center post d/c In one to two days I have seen and examined [name] and agree with the above assessment  and plan.  Delight Ovens MD [place] [phone] Office [phone] 10/14/2012 2:43 PM

## 2012-10-15 LAB — PROTIME-INR: Prothrombin Time: 20 seconds — ABNORMAL HIGH (ref 11.6–15.2)

## 2012-10-15 NOTE — Progress Notes (Signed)
Patient is doing well- states he walked in the hall today farther than yesterday- "and with a smile". He is still needing SNF placement and is agreeable to this- he states that the Financial Counselor has told him they are working on a 'package" for him since he is self pay-  I have left a message for her to determine what they are actually doing-  We are pursuing SNF options and will advise as updates are rec'd on SNF and financial end-  Reece Levy, MSW, [place] [phone]

## 2012-10-15 NOTE — Progress Notes (Signed)
Patient transferred back to unit 2000- s/p CABG. Observed patient up in hallway with PT and will f/u with him for further d/c planning- ?SNF  Reece Levy, MSW, [place] [phone]

## 2012-10-15 NOTE — Progress Notes (Signed)
Pt ambulated 150 ft with rolling walker on RA. Pt tolerated well. Only complaint was leg pain related to gout. Back to chair after ambulation. Dion Saucier

## 2012-10-15 NOTE — Progress Notes (Signed)
Updated by financial counselors that patient is not eligible for Medicaid due to a bank account- she will update patient and his family-  Patient will have to be private pay for SNF- will f/u with patient to further proceed with SNF  Reece Levy, MSW, [facility] [phone]

## 2012-10-15 NOTE — Progress Notes (Signed)
Pt. Seen and examined. Agree with the NP/PA-C note as written.  He continues to improve. Maintaining sinus rhythm after surgical MAZE. No chest pain. Holding ACE-I due to low normal BP. On colchicine for acute gout flare. Continues on amiodarone. Low arrhythmic potential due to this -therefore, it is reasonable to re-check outpatient echo in 3 months to see if EF has improved to >35%. Consider checking uric acid level before discharge - which will be helpful in determine post-gout flare therapy.  Chrystie Nose, MD, [facility] Attending Cardiologist [facility]

## 2012-10-15 NOTE — Progress Notes (Signed)
Pt walk up to the bathroom only,refused to walk further,conplains of left knee pain on ambulation. Anjanette Gilkey RN

## 2012-10-15 NOTE — Progress Notes (Signed)
CARDIAC REHAB PHASE I   PRE:  Rate/Rhythm: 64SR  BP:  Supine:   Sitting: 100/52  Standing:    SaO2: 93%RA  MODE:  Ambulation: 150 ft   POST:  Rate/Rhythm: 77SR  BP:  Supine:   Sitting: 96/50  Standing:    SaO2: 89-92%RA 1348-1420 Pt walked third walk. Walked 150 ft on RA with gait belt use and rolling walker. Pt rocked to stand with still much assistance needed due to left knee gout. Tired by end of walk. To recliner with call bell. Took a few minutes to get pulse ox to register. 89-92%RA. Pt slightly SOB.   Luetta Nutting, RN BSN  10/15/2012 2:17 PM

## 2012-10-15 NOTE — Progress Notes (Signed)
Met with patient and updated him on likelihood of SNF being out of pocket for him at d/c- given he has some monies in an account that need to be used in order for him to be Medicaid eligible- patient understands and is discussing with his family- they are also considering and looking at possible help at home- Patient was presented with SNF offers and private pay rates- CSW will f/u tomorrow to further assist with d/c plans- patient continues to progress with PT and is pleased with his recovery thus far! Reece Levy, MSW, [facility] [phone]

## 2012-10-15 NOTE — Progress Notes (Signed)
[facility]  Subjective: Denies chest pain. Ambulation has been hindered due to gout flare.   Objective: Vital signs in last 24 hours: Temp:  [97.5 F (36.4 C)-98.5 F (36.9 C)] 97.5 F (36.4 C) (06/24 0436) Pulse Rate:  [60-81] 79 (06/24 0436) Resp:  [18] 18 (06/24 0436) BP: (83-112)/(41-68) 105/68 mmHg (06/24 0436) SpO2:  [95 %-98 %] 97 % (06/24 0436) Weight:  [200 lb 6.4 oz (90.9 kg)] 200 lb 6.4 oz (90.9 kg) (06/24 0436) Last BM Date: 10/13/12  Intake/Output from previous day: 06/23 0701 - 06/24 0700 In: 240 [P.O.:240] Out: 550 [Urine:550] Intake/Output this shift: Total I/O In: 240 [P.O.:240] Out: -   Medications Current Facility-Administered Medications  Medication Dose Route Frequency Provider Last Rate Last Dose  . 0.9 %  sodium chloride infusion  250 mL Intravenous PRN Delight Ovens, MD      . acetaminophen (TYLENOL) tablet 650 mg  650 mg Oral Q6H PRN Delight Ovens, MD      . amiodarone (PACERONE) tablet 400 mg  400 mg Oral BID Erin Barrett, PA-C   400 mg at 10/14/12 2117  . aspirin EC tablet 81 mg  81 mg Oral Daily Delight Ovens, MD   81 mg at 10/14/12 1000  . bisacodyl (DULCOLAX) EC tablet 10 mg  10 mg Oral Daily PRN Delight Ovens, MD       Or  . bisacodyl (DULCOLAX) suppository 10 mg  10 mg Rectal Daily PRN Delight Ovens, MD      . carvedilol (COREG) tablet 6.25 mg  6.25 mg Oral BID WC Delight Ovens, MD   6.25 mg at 10/15/12 0839  . colchicine tablet 0.6 mg  0.6 mg Oral BID Ardelle Balls, PA-C   0.6 mg at 10/14/12 2117  . digoxin (LANOXIN) tablet 0.125 mg  0.125 mg Oral Daily Delight Ovens, MD   0.125 mg at 10/14/12 1000  . docusate sodium (COLACE) capsule 200 mg  200 mg Oral Daily Delight Ovens, MD   200 mg at 10/12/12 1018  . furosemide (LASIX) injection 40 mg  40 mg Intravenous Q12H Erin Barrett, PA-C   40 mg at 10/15/12 0839  . levalbuterol (XOPENEX) nebulizer solution 0.63 mg  0.63 mg  Nebulization Q6H PRN Delight Ovens, MD      . ondansetron [facility]) tablet 4 mg  4 mg Oral Q6H PRN Delight Ovens, MD       Or  . ondansetron [facility]) injection 4 mg  4 mg Intravenous Q6H PRN Delight Ovens, MD      . oxyCODONE (Oxy IR/ROXICODONE) immediate release tablet 5-10 mg  5-10 mg Oral Q3H PRN Delight Ovens, MD   10 mg at 10/15/12 0252  . pantoprazole (PROTONIX) EC tablet 40 mg  40 mg Oral QAC breakfast Delight Ovens, MD   40 mg at 10/15/12 0839  . potassium chloride SA (K-DUR,KLOR-CON) CR tablet 20 mEq  20 mEq Oral Daily Delight Ovens, MD   20 mEq at 10/14/12 1000  . simvastatin (ZOCOR) tablet 20 mg  20 mg Oral q1800 Delight Ovens, MD   20 mg at 10/14/12 1637  . sodium chloride 0.9 % injection 3 mL  3 mL Intravenous Q12H Delight Ovens, MD   3 mL at 10/14/12 2118  . sodium chloride 0.9 % injection 3 mL  3 mL Intravenous PRN Delight Ovens, MD      . traMADol (  ULTRAM) tablet 50-100 mg  50-100 mg Oral Q4H PRN Delight Ovens, MD   100 mg at 10/14/12 1637  . warfarin (COUMADIN) tablet 5 mg  5 mg Oral q1800 Loreli Slot, MD   5 mg at 10/14/12 1637  . Warfarin - Physician Dosing Inpatient   Does not apply q1800 Delight Ovens, MD        PE: General appearance: alert, cooperative and no distress Lungs: clear to auscultation bilaterally Heart: regular rate and rhythm, S1, S2 normal, no murmur, click, rub or gallop Extremities: 2+ bilateral LEE Pulses: 2+ and symmetric Skin: warm and dry Neurologic: Grossly normal  Lab Results:  No results found for this basename: WBC, HGB, HCT, PLT,  in the last 72 hours BMET  Recent Labs  10/14/12 0455  NA 130*  K 5.0  CL 95*  CO2 28  GLUCOSE 122*  BUN 28*  CREATININE 1.02  CALCIUM 8.3*   PT/INR  Recent Labs  10/13/12 1000 10/14/12 0455 10/15/12 0418  LABPROT 13.4 15.0 20.0*  INR 1.03 1.20 1.77*    Assessment/Plan  Principal Problem:   Acute respiratory failure, with BiPAP needed on  admit Active Problems:   PAF (paroxysmal atrial fibrillation), he has had periods of NSR since admission   HTN (hypertension)   Hyperlipidemia   Noncompliance, no meds for two weeks prior to admission   Tobacco use   Acute combined systolic and diastolic CHF, NYHA class 3 -- previous normal LV function and moderate AS- re-evaluate; EF now 30-35% with regional WMA   Bilateral lower extremity edema, secondary to acute CHF   Aortic stenosis, severe   CAD, RCA/PDA, 30-40% LM at cath 09/20/12   Cellulitis, lower extremity- treated   PAD (peripheral artery disease), decreased bil. ABIs   Chronic obstructive pulmonary disease   Cardiomyopathy, ischemic- 30-35% 2D    Gout flare. 09/29/12, Lt knee, improved with colchicine.  Plan: S/P CABG, AVR and Maze procedure. POD # 7. Maintaining NSR on telemetry. No chest pain. HR is stable. BP is a little soft. Continue to hold ACE-I. Continue IV Lasix for edema. On Coumadin. INR is subtherapeutic at 1.77. ? Repeating an echo to determine if pt will need a LifeVest prior to discharge (see Dr. Elissa Hefty note from 6/23 for details).      LOS: 29 days    [name] M. [name] 10/15/2012 9:16 AM

## 2012-10-15 NOTE — Progress Notes (Signed)
Physical Therapy Treatment Patient Details Name: [name] MRN: [id number] DOB: Feb 25, 1951 Today's Date: 10/15/2012 Time: 4098-1191 PT Time Calculation (min): 17 min  PT Assessment / Plan / Recommendation Comments on Treatment Session  Pt able to increase ambulation today and demonstrates increased compliance with sternal precautions. WIll continue to see and progress activity as tolerated.    Follow Up Recommendations  Home health PT;Supervision/Assistance - 24 hour;SNF (if no 24 hour care would recommend SNF)     Does the patient have the potential to tolerate intense rehabilitation     Barriers to Discharge        Equipment Recommendations  Rolling walker with 5" wheels    Recommendations for Other Services    Frequency Min 3X/week   Plan Discharge plan remains appropriate    Precautions / Restrictions Precautions Precautions: Sternal Precaution Comments: educated patient on sternal precautions and techniques for following with mobility Restrictions Weight Bearing Restrictions: No (sternal precautions)   Pertinent Vitals/Pain 3/10 knee pain prior to activity    Mobility  Bed Mobility Bed Mobility: Supine to Sit;Sitting - Scoot to Edge of Bed Supine to Sit: 4: Min guard;HOB elevated Sitting - Scoot to [place] of Bed: 4: Min guard Details for Bed Mobility Assistance: No physical assist needed; good compliance with sternal precautions Transfers Transfers: Sit to Stand;Stand to Sit Sit to Stand: 4: Min assist Stand to Sit: 4: Min assist;To chair/3-in-1 Details for Transfer Assistance: Assist for posterior stability and to control descent to chair Ambulation/Gait Ambulation/Gait Assistance: 5: Supervision;4: Min guard Ambulation Distance (Feet): 310 Feet Assistive device: Rolling walker Ambulation/Gait Assistance Details: patient with good pacing and only required 2 standing rest breaks today.. Gait Pattern: Step-through pattern;Antalgic;Trunk flexed Gait velocity:  decreased General Gait Details: Patient steady with ambulation today Stairs: No      PT Goals Acute Rehab PT Goals PT Goal Formulation: With patient Time For Goal Achievement: 10/07/12 Potential to Achieve Goals: Good Pt will go Supine/Side to Sit: with modified independence PT Goal: Supine/Side to Sit - Progress: Progressing toward goal Pt will go Sit to Stand: with modified independence PT Goal: Sit to Stand - Progress: Progressing toward goal Pt will go Stand to Sit: with modified independence PT Goal: Stand to Sit - Progress: Progressing toward goal Pt will Stand: Independently PT Goal: Stand - Progress: Progressing toward goal Pt will Ambulate: >150 feet;with least restrictive assistive device;with modified independence PT Goal: Ambulate - Progress: Progressing toward goal Pt will Go Up / Down Stairs: 1-2 stairs;with modified independence;with least restrictive assistive device  Visit Information  Last PT Received On: 10/15/12 Assistance Needed: +1    Subjective Data  Subjective: I just have some pain in my chest Patient Stated Goal: To get better and eventually get home.   Cognition  Cognition Arousal/Alertness: Awake/alert Behavior During Therapy: WFL for tasks assessed/performed Overall Cognitive Status: Within Functional Limits for tasks assessed    Balance  Balance Balance Assessed: Yes Static Sitting Balance Static Sitting - Balance Support: Bilateral upper extremity supported Static Sitting - Level of Assistance: 7: Independent  End of Session PT - End of Session Equipment Utilized During Treatment: Gait belt;Oxygen Activity Tolerance: Patient tolerated treatment well Patient left: in chair;with call bell/phone within reach;with family/visitor present Nurse Communication: Mobility status   GP     Fabio Asa 10/15/2012, 8:42 AM Charlotte Crumb, PT DPT  [phone]

## 2012-10-15 NOTE — Progress Notes (Addendum)
       [address]       [place] [address]             [phone]          7 Days Post-Op Procedure(s) (LRB): CORONARY ARTERY BYPASS GRAFTING (CABG) (N/A) AORTIC VALVE REPLACEMENT (AVR) (N/A) MAZE (N/A) INTRAOPERATIVE TRANSESOPHAGEAL ECHOCARDIOGRAM (N/A) ENDOVEIN HARVEST OF GREATER SAPHENOUS VEIN (Bilateral)  Subjective: Winded after walking with PT.  Still having some gout pain in left knee, but overall feeling better.   Objective: Vital signs in last 24 hours: Patient Vitals for the past 24 hrs:  BP Temp Temp src Pulse Resp SpO2 Weight  10/15/12 0436 105/68 mmHg 97.5 F (36.4 C) Oral 79 18 97 % 200 lb 6.4 oz (90.9 kg)  10/14/12 2055 100/45 mmHg - - 64 18 98 % -  10/14/12 2013 83/54 mmHg 98.3 F (36.8 C) Oral 60 18 98 % -  10/14/12 1958 106/52 mmHg - - - - - -  10/14/12 1638 103/47 mmHg - - 75 - - -  10/14/12 1330 109/46 mmHg - - 75 - - -  10/14/12 1317 112/41 mmHg 98.5 F (36.9 C) Oral 76 18 95 % -  10/14/12 1300 109/50 mmHg - - - - - -  10/14/12 1245 108/42 mmHg - - - - - -  10/14/12 1230 104/49 mmHg - - 80 - - -  10/14/12 1000 107/44 mmHg - - 81 - - -   Current Weight  10/15/12 200 lb 6.4 oz (90.9 kg)  10/14/12         202 lb 6.4 oz (91.808 kg)   Intake/Output from previous day: 06/23 0701 - 06/24 0700 In: 240 [P.O.:240] Out: 550 [Urine:550]    PHYSICAL EXAM:  Heart: RRR Lungs: slightly decreased BS in bases Wound: Clean and dry Extremities: +bilateral LE edema    Lab Results: CBC:No results found for this basename: WBC, HGB, HCT, PLT,  in the last 72 hours BMET:  Recent Labs  10/14/12 0455  NA 130*  K 5.0  CL 95*  CO2 28  GLUCOSE 122*  BUN 28*  CREATININE 1.02  CALCIUM 8.3*    PT/INR:  Recent Labs  10/15/12 0418  LABPROT 20.0*  INR 1.77*      Assessment/Plan: S/P Procedure(s) (LRB): CORONARY ARTERY BYPASS GRAFTING (CABG) (N/A) AORTIC VALVE REPLACEMENT (AVR) (N/A) MAZE (N/A) INTRAOPERATIVE  TRANSESOPHAGEAL ECHOCARDIOGRAM (N/A) ENDOVEIN HARVEST OF GREATER SAPHENOUS VEIN (Bilateral)  CV- AF, now maintaining SR.  Continue Amio, Dig, Coreg, Coumadin.  BPs have been low normal- watch.  May need to decrease Coreg.  Vol overload- wt slowly trending down.  Continue IV Lasix for now. Follow up labs.  Continue PT/CRPI.  Disp- pt requesting SNF at d/c but not yet seen by CSW.  Will place consult.  Should be medically stable for d/c in next day or so.   LOS: 29 days    COLLINS,GINA H 10/15/2012 Holding  sinus I have seen and examined [name] and agree with the above assessment  and plan.  Delight Ovens MD Beeper [phone] Office [phone] 10/15/2012 9:09 PM

## 2012-10-16 LAB — BASIC METABOLIC PANEL
CO2: 26 mEq/L (ref 19–32)
GFR calc non Af Amer: 47 mL/min — ABNORMAL LOW (ref >90)
Glucose, Bld: 79 mg/dL (ref 70–99)
Potassium: 4.6 mEq/L (ref 3.5–5.1)
Sodium: 130 mEq/L — ABNORMAL LOW (ref 135–145)

## 2012-10-16 NOTE — Progress Notes (Signed)
Pt ambulated 200 feet with RN and rolling walker; pt back to room to chair; no complaints of pain; will cont. To monitor.

## 2012-10-16 NOTE — Progress Notes (Signed)
. [facility] Progress Note  Subjective:  Mild L knee soreness; no chest pain  Objective:   Vital Signs in the last 24 hours: Temp:  [97.9 F (36.6 C)-98.6 F (37 C)] 97.9 F (36.6 C) (06/25 0448) Pulse Rate:  [66-75] 75 (06/25 0448) Resp:  [18] 18 (06/25 0448) BP: (101-114)/(45-68) 101/61 mmHg (06/25 0448) SpO2:  [92 %-97 %] 92 % (06/25 0448) Weight:  [197 lb 8.5 oz (89.6 kg)] 197 lb 8.5 oz (89.6 kg) (06/25 0448)  Intake/Output from previous day: 06/24 0701 - 06/25 0700 In: 480 [P.O.:480] Out: 1100 [Urine:1100]  Scheduled: . amiodarone  400 mg Oral BID  . aspirin EC  81 mg Oral Daily  . carvedilol  6.25 mg Oral BID WC  . colchicine  0.6 mg Oral BID  . digoxin  0.125 mg Oral Daily  . docusate sodium  200 mg Oral Daily  . furosemide  40 mg Intravenous Q12H  . pantoprazole  40 mg Oral QAC breakfast  . potassium chloride  20 mEq Oral Daily  . simvastatin  20 mg Oral q1800  . sodium chloride  3 mL Intravenous Q12H  . Warfarin - Physician Dosing Inpatient   Does not apply q1800    Physical Exam:   General appearance: alert, cooperative and no distress Neck: no adenopathy, no JVD and supple, symmetrical, trachea midline Lungs: decreased BS at bases; no wheezing Heart: regular rate and rhythm, S1, S2 normal and 1/6 sem Abdomen: soft, non-tender; bowel sounds normal; no masses,  no organomegaly Extremities: 1+ edema persists; soreness L knee region Neurologic: Grossly normal   Rate: 75  Rhythm: normal sinus rhythm  Lab Results:    Recent Labs  10/14/12 0455 10/16/12 0520  NA 130* 130*  K 5.0 4.6  CL 95* 92*  CO2 28 26  GLUCOSE 122* 79  BUN 28* 47*  CREATININE 1.02 1.52*   No results found for this basename: TROPONINI, CK, MB,  in the last 72 hours Hepatic Function Panel No results found for this basename: PROT, ALBUMIN, AST, ALT, ALKPHOS, BILITOT, BILIDIR, IBILI,  in the last 72 hours  Recent Labs  10/16/12 0520  INR  2.29*   BNP (last 3 results)  Recent Labs  09/24/12 0354 09/28/12 0405 10/01/12 0415  PROBNP 15462.0* 12365.0* 11193.0*   Lipid Panel  Lipid Panel     Component Value Date/Time   CHOL 145 09/17/2012 0355   TRIG 77 09/17/2012 0355   HDL 47 09/17/2012 0355   CHOLHDL 3.1 09/17/2012 0355   VLDL 15 09/17/2012 0355   LDLCALC 83 09/17/2012 0355      Imaging:  No results found.    Assessment/Plan:   Principal Problem:   Acute respiratory failure, with BiPAP needed on admit Active Problems:   PAF (paroxysmal atrial fibrillation), he has had periods of NSR since admission   HTN (hypertension)   Hyperlipidemia   Noncompliance, no meds for two weeks prior to admission   Tobacco use   Acute combined systolic and diastolic CHF, NYHA class 3 -- previous normal LV function and moderate AS- re-evaluate; EF now 30-35% with regional WMA   Bilateral lower extremity edema, secondary to acute CHF   Aortic stenosis, severe   CAD, RCA/PDA, 30-40% LM at cath 09/20/12   Cellulitis, lower extremity- treated   PAD (peripheral artery disease), decreased bil. ABIs   Chronic obstructive pulmonary disease   Cardiomyopathy, ischemic- 30-35% 2D    Gout flare. 09/29/12, Lt knee, improved  with colchicine.  Cardiac status stabiling. Maintaining NSR s/p Maze procedure. Net I/O since admission -[id number].  On colchicine for gout flare. For probable short term SNF post DC.    Lennette Bihari, MD, [facility] 10/16/2012, 9:31 AM

## 2012-10-16 NOTE — Progress Notes (Signed)
Patient has been given several SNF private pay rates for him to consider for SNF- he and his brother are working to make decisions on which SNF they prefer- spoke with PA-C and anticipate possible d/c tomorrow- FL2 on chart for MD signature- Reece Levy, MSW, [facility] [phone]

## 2012-10-16 NOTE — Progress Notes (Signed)
Pt ambulated 200 feet with rolling walker and RN; pt stopped X2 for rest breaks; pt back to room to chair; call bell w/i reach; will cont. To monitor.

## 2012-10-16 NOTE — Progress Notes (Signed)
Nutrition Brief Note  Patient s/p CABG, AVR and MAZE procedures 6/17.  Transferred from SICU 6/20.  Body mass index is 28.34 kg/(m^2). Patient meets criteria for Normal based on current BMI.   Current diet order is Carbohydrate Modified Medium Calorie, patient is consuming approximately 75-100% of meals at this time. Labs and medications reviewed.   No nutrition interventions warranted at this time. If nutrition issues arise, please consult RD.    Maureen Chatters, RD, LDN Pager #: [phone] After-Hours Pager #: [phone]

## 2012-10-16 NOTE — Progress Notes (Addendum)
       [address]       [place] [address]             [phone]          8 Days Post-Op Procedure(s) (LRB): CORONARY ARTERY BYPASS GRAFTING (CABG) (N/A) AORTIC VALVE REPLACEMENT (AVR) (N/A) MAZE (N/A) INTRAOPERATIVE TRANSESOPHAGEAL ECHOCARDIOGRAM (N/A) ENDOVEIN HARVEST OF GREATER SAPHENOUS VEIN (Bilateral)  Subjective: Feels worn out today- thinks he overdid it yesterday.  Overall, progressing well.   Objective: Vital signs in last 24 hours: Patient Vitals for the past 24 hrs:  BP Temp Temp src Pulse Resp SpO2 Weight  10/16/12 0448 101/61 mmHg 97.9 F (36.6 C) Oral 75 18 92 % 197 lb 8.5 oz (89.6 kg)  10/15/12 2008 114/68 mmHg 98.6 F (37 C) Oral 73 18 92 % -  10/15/12 1747 110/52 mmHg - - 72 - - -  10/15/12 1325 106/45 mmHg 98 F (36.7 C) Oral 66 18 97 % -   Current Weight  10/16/12 197 lb 8.5 oz (89.6 kg)   10/15/12  200 lb 6.4 oz (90.9 kg)   10/14/12          202 lb 6.4 oz (91.808 kg)    Intake/Output from previous day: 06/24 0701 - 06/25 0700 In: 480 [P.O.:480] Out: 1100 [Urine:1100]    PHYSICAL EXAM:  Heart: RRR Lungs: Clear Wound: Clean and dry Extremities: +LE edema    Lab Results: CBC:No results found for this basename: WBC, HGB, HCT, PLT,  in the last 72 hours BMET:  Recent Labs  10/14/12 0455 10/16/12 0520  NA 130* 130*  K 5.0 4.6  CL 95* 92*  CO2 28 26  GLUCOSE 122* 79  BUN 28* 47*  CREATININE 1.02 1.52*  CALCIUM 8.3* 8.2*    PT/INR:  Recent Labs  10/16/12 0520  LABPROT 24.2*  INR 2.29*      Assessment/Plan: S/P Procedure(s) (LRB): CORONARY ARTERY BYPASS GRAFTING (CABG) (N/A) AORTIC VALVE REPLACEMENT (AVR) (N/A) MAZE (N/A) INTRAOPERATIVE TRANSESOPHAGEAL ECHOCARDIOGRAM (N/A) ENDOVEIN HARVEST OF GREATER SAPHENOUS VEIN (Bilateral) CV- AF, now maintaining SR. Continue Amio, Dig, Coreg. BPs stable. INR with big jump- will hold Coumadin tonight so EPWs may be d/c'ed. Cr elevated today- monitor  closely. Vol overload- wt slowly trending down. Continue IV Lasix for now.  Continue PT/CRPI.  Disp- family deciding on SNF.  Hopefully ready for d/c soon.   LOS: 30 days    COLLINS,GINA H 10/16/2012  I have seen and examined [name] and agree with the above assessment  and plan.  Delight Ovens MD Beeper [phone] Office [phone] 10/16/2012 3:57 PM

## 2012-10-16 NOTE — Progress Notes (Signed)
CARDIAC REHAB PHASE I   PRE:  Rate/Rhythm: 66 SR  BP:  Supine:   Sitting: 120/50  Standing:    SaO2: 96 RA  MODE:  Ambulation: 178 ft   POST:  Rate/Rhythm: 67  BP:  Supine:   Sitting: 130/50  Standing:    SaO2: would not register 1110-1140 Assisted X 1 used walker and gait belt to ambulate. Gait steady with walker. Pt has some difficulty getting out of recliner and gout is painful with walking. He was able to increase his distance to 178 feet today. Pt walked to his new room 2019 then to recliner after walk. Call light in reach.  Melina Copa RN 10/16/2012 11:38 AM

## 2012-10-17 ENCOUNTER — Institutional Professional Consult (permissible substitution): Payer: Self-pay | Admitting: Pulmonary Disease

## 2012-10-17 LAB — PROTIME-INR: INR: 2.22 — ABNORMAL HIGH (ref 0.00–1.49)

## 2012-10-17 LAB — BASIC METABOLIC PANEL
CO2: 29 mEq/L (ref 19–32)
Calcium: 8 mg/dL — ABNORMAL LOW (ref 8.4–10.5)
Chloride: 97 mEq/L (ref 96–112)
GFR calc Af Amer: 68 mL/min — ABNORMAL LOW (ref >90)
Sodium: 134 mEq/L — ABNORMAL LOW (ref 135–145)

## 2012-10-17 MED ORDER — WARFARIN SODIUM 2.5 MG PO TABS
2.5000 mg | ORAL_TABLET | ORAL | Status: DC
  Filled 2012-10-17: qty 1

## 2012-10-17 MED ORDER — WARFARIN SODIUM 5 MG PO TABS
5.0000 mg | ORAL_TABLET | ORAL | Status: DC
  Administered 2012-10-17: 5 mg via ORAL
  Filled 2012-10-17 (×2): qty 1

## 2012-10-17 MED ORDER — POTASSIUM CHLORIDE CRYS ER 20 MEQ PO TBCR
40.0000 meq | EXTENDED_RELEASE_TABLET | Freq: Once | ORAL | Status: AC
  Administered 2012-10-17: 40 meq via ORAL
  Filled 2012-10-17: qty 2

## 2012-10-17 MED ORDER — POTASSIUM CHLORIDE CRYS ER 20 MEQ PO TBCR
20.0000 meq | EXTENDED_RELEASE_TABLET | Freq: Two times a day (BID) | ORAL | Status: DC
  Administered 2012-10-17 – 2012-10-18 (×2): 20 meq via ORAL
  Filled 2012-10-17 (×3): qty 1

## 2012-10-17 NOTE — Progress Notes (Addendum)
      [address]       [place] [address]             [phone]     9 Days Post-Op Procedure(s) (LRB): CORONARY ARTERY BYPASS GRAFTING (CABG) (N/A) AORTIC VALVE REPLACEMENT (AVR) (N/A) MAZE (N/A) INTRAOPERATIVE TRANSESOPHAGEAL ECHOCARDIOGRAM (N/A) ENDOVEIN HARVEST OF GREATER SAPHENOUS VEIN (Bilateral)  Subjective:  [name] is without complaints this morning.  He has agreed to SNF +BM  Objective: Vital signs in last 24 hours: Temp:  [97.7 F (36.5 C)-98.5 F (36.9 C)] 98.5 F (36.9 C) (06/26 0409) Pulse Rate:  [66-93] 93 (06/26 0409) Cardiac Rhythm:  [-] Normal sinus rhythm (06/26 0802) Resp:  [16-18] 17 (06/26 0409) BP: (100-131)/(40-68) 116/68 mmHg (06/26 0409) SpO2:  [91 %-98 %] 91 % (06/26 0409) Weight:  [195 lb 1.6 oz (88.497 kg)] 195 lb 1.6 oz (88.497 kg) (06/26 0500)  Intake/Output from previous day: 06/25 0701 - 06/26 0700 In: 120 [P.O.:120] Out: 300 [Urine:300]  General appearance: alert, cooperative and no distress Heart: regular rate and rhythm Lungs: clear to auscultation bilaterally Abdomen: soft, non-tender; bowel sounds normal; no masses,  no organomegaly Extremities: edema 2-3+ pitting Wound: clean and dry  Lab Results: No results found for this basename: WBC, HGB, HCT, PLT,  in the last 72 hours BMET:  Recent Labs  10/16/12 0520 10/17/12 0415  NA 130* 134*  K 4.6 3.8  CL 92* 97  CO2 26 29  GLUCOSE 79 82  BUN 47* 39*  CREATININE 1.52* 1.27  CALCIUM 8.2* 8.0*    PT/INR:  Recent Labs  10/17/12 0415  LABPROT 23.9*  INR 2.22*   ABG    Component Value Date/Time   PHART 7.356 10/08/2012 2336   HCO3 22.0 10/08/2012 2336   TCO2 23 10/09/2012 1650   ACIDBASEDEF 4.0* 10/08/2012 2336   O2SAT 92.0 10/08/2012 2336   CBG (last 3)  No results found for this basename: GLUCAP,  in the last 72 hours  Assessment/Plan: S/P Procedure(s) (LRB): CORONARY ARTERY BYPASS GRAFTING (CABG) (N/A) AORTIC VALVE REPLACEMENT (AVR)  (N/A) MAZE (N/A) INTRAOPERATIVE TRANSESOPHAGEAL ECHOCARDIOGRAM (N/A) ENDOVEIN HARVEST OF GREATER SAPHENOUS VEIN (Bilateral)  1. CV- converted into rate controlled A. Fib this morning continue Coreg, Amiodarone, Dig 2. INR 2.22- will resume coumadin tonight 3. Renal- creatinine mildly elevated at 1.27, remains quite volume overloaded on IV Lasix 4. Hypokalemia- will increase Potassium supplementation 5. Deconditioning- continue PT, will plan for SNF at [facility] 6. Dispo- patient doing well, continue IV diuresis remains volume overloaded, will order TED Hose, hopefully SNF tomorrow   LOS: 31 days    BARRETT, ERIN 10/17/2012  To rehab tomorrow when bed ready I have seen and examined [name] and agree with the above assessment  and plan.  Delight Ovens MD Beeper [phone] Office [phone] 10/17/2012 8:48 AM

## 2012-10-17 NOTE — Progress Notes (Signed)
[facility]  Subjective: No complaints. Ready for discharge to SNF.   Objective: Vital signs in last 24 hours: Temp:  [97.7 F (36.5 C)-98.5 F (36.9 C)] 98.5 F (36.9 C) (06/26 0409) Pulse Rate:  [66-93] 93 (06/26 0409) Resp:  [16-18] 17 (06/26 0409) BP: (100-131)/(40-68) 116/68 mmHg (06/26 0409) SpO2:  [91 %-98 %] 91 % (06/26 0409) Weight:  [195 lb 1.6 oz (88.497 kg)] 195 lb 1.6 oz (88.497 kg) (06/26 0500) Last BM Date: 10/17/12  Intake/Output from previous day: 06/25 0701 - 06/26 0700 In: 120 [P.O.:120] Out: 300 [Urine:300] Intake/Output this shift: Total I/O In: 240 [P.O.:240] Out: -   Medications Current Facility-Administered Medications  Medication Dose Route Frequency Provider Last Rate Last Dose  . 0.9 %  sodium chloride infusion  250 mL Intravenous PRN Delight Ovens, MD      . acetaminophen (TYLENOL) tablet 650 mg  650 mg Oral Q6H PRN Delight Ovens, MD      . amiodarone (PACERONE) tablet 400 mg  400 mg Oral BID Erin Barrett, PA-C   400 mg at 10/16/12 2139  . aspirin EC tablet 81 mg  81 mg Oral Daily Delight Ovens, MD   81 mg at 10/16/12 1026  . bisacodyl (DULCOLAX) EC tablet 10 mg  10 mg Oral Daily PRN Delight Ovens, MD       Or  . bisacodyl (DULCOLAX) suppository 10 mg  10 mg Rectal Daily PRN Delight Ovens, MD      . carvedilol (COREG) tablet 6.25 mg  6.25 mg Oral BID WC Delight Ovens, MD   6.25 mg at 10/17/12 0616  . colchicine tablet 0.6 mg  0.6 mg Oral BID Ardelle Balls, PA-C   0.6 mg at 10/16/12 2139  . digoxin (LANOXIN) tablet 0.125 mg  0.125 mg Oral Daily Delight Ovens, MD   0.125 mg at 10/16/12 1026  . docusate sodium (COLACE) capsule 200 mg  200 mg Oral Daily Delight Ovens, MD   200 mg at 10/16/12 1026  . furosemide (LASIX) injection 40 mg  40 mg Intravenous Q12H Erin Barrett, PA-C   40 mg at 10/17/12 0755  . levalbuterol (XOPENEX) nebulizer solution 0.63 mg  0.63 mg Nebulization Q6H PRN  Delight Ovens, MD      . ondansetron [facility]) tablet 4 mg  4 mg Oral Q6H PRN Delight Ovens, MD       Or  . ondansetron [facility]) injection 4 mg  4 mg Intravenous Q6H PRN Delight Ovens, MD      . oxyCODONE (Oxy IR/ROXICODONE) immediate release tablet 5-10 mg  5-10 mg Oral Q3H PRN Delight Ovens, MD   5 mg at 10/17/12 [phone]  . pantoprazole (PROTONIX) EC tablet 40 mg  40 mg Oral QAC breakfast Delight Ovens, MD   40 mg at 10/17/12 0753  . potassium chloride SA (K-DUR,KLOR-CON) CR tablet 20 mEq  20 mEq Oral BID Erin Barrett, PA-C      . potassium chloride SA (K-DUR,KLOR-CON) CR tablet 40 mEq  40 mEq Oral Once Erin Barrett, PA-C      . simvastatin (ZOCOR) tablet 20 mg  20 mg Oral q1800 Delight Ovens, MD   20 mg at 10/16/12 1657  . sodium chloride 0.9 % injection 3 mL  3 mL Intravenous Q12H Delight Ovens, MD   3 mL at 10/17/12 0755  . sodium chloride 0.9 % injection 3 mL  3  mL Intravenous PRN Delight Ovens, MD      . traMADol Janean Sark) tablet 50-100 mg  50-100 mg Oral Q4H PRN Delight Ovens, MD   100 mg at 10/14/12 1637  . [START ON 10/18/2012] warfarin (COUMADIN) tablet 2.5 mg  2.5 mg Oral Q M,W,F-1800 Erin Barrett, PA-C      . warfarin (COUMADIN) tablet 5 mg  5 mg Oral Q T,Th,S,Su-1800 Erin Barrett, PA-C      . Warfarin - Physician Dosing Inpatient   Does not apply q1800 Delight Ovens, MD        PE: General appearance: alert, cooperative and no distress Lungs: decreased BS at bilateral bases Heart: irregularly irregular rhythm Extremities: 2+ bilateral pitting LEE Pulses: 2+ and symmetric Skin: warm and dry Neurologic: Grossly normal  Lab Results:  BMET  Recent Labs  10/16/12 0520 10/17/12 0415  NA 130* 134*  K 4.6 3.8  CL 92* 97  CO2 26 29  GLUCOSE 79 82  BUN 47* 39*  CREATININE 1.52* 1.27  CALCIUM 8.2* 8.0*   PT/INR  Recent Labs  10/15/12 0418 10/16/12 0520 10/17/12 0415  LABPROT 20.0* 24.2* 23.9*  INR 1.77* 2.29* 2.22*     Assessment/Plan  Principal Problem:   Acute respiratory failure, with BiPAP needed on admit Active Problems:   PAF (paroxysmal atrial fibrillation), he has had periods of NSR since admission   HTN (hypertension)   Hyperlipidemia   Noncompliance, no meds for two weeks prior to admission   Tobacco use   Acute combined systolic and diastolic CHF, NYHA class 3 -- previous normal LV function and moderate AS- re-evaluate; EF now 30-35% with regional WMA   Bilateral lower extremity edema, secondary to acute CHF   Aortic stenosis, severe   CAD, RCA/PDA, 30-40% LM at cath 09/20/12   Cellulitis, lower extremity- treated   PAD (peripheral artery disease), decreased bil. ABIs   Chronic obstructive pulmonary disease   Cardiomyopathy, ischemic- 30-35% 2D    Gout flare. 09/29/12, Lt knee, improved with colchicine.  Plan: Day 9 s/p CABG, AVR and MAZE procedure. Progressing well. No chest pain. No issues with cardiac rehab. He is back in atrial fibrillation, however his HR is controlled in the 80s. Continue w/ Amiodarone, BB and digoxin. Warfarin for AC. His INR is therapeutic at 2.22. Continue with Lasix for LEE. Pt is nearing discharge to SNF soon. Will arrange outpatient follow-up at [facility].     LOS: 31 days   Anticipate DC to SNF tomorrow. AFIB with CVR on appropriate Rx. INR therapeutic. Still has 2+ pitting BLE edema. Need to follow closely as OP and adjust diuretic accordingly.    Brittainy M. Sharol Harness, PA-C 10/17/2012 10:16 AM

## 2012-10-17 NOTE — Progress Notes (Signed)
Patient has selected SNF bed at [facility] and we anticipate d/c tomorrow- he has completed paperwork and written a check for 2 weeks of room/board/ 1 hour daily therapy and RX's (anticipate meds to be approx $40 day).  He is positive and ready to continue his therapy. CSW to f/u tomorrow-  Reece Levy, MSW, [facility] [phone]

## 2012-10-17 NOTE — Progress Notes (Signed)
Pt ambulated 350 feet with rolling walker and RN; pt tolerated ambulation well; pt stopped for 1 rest break; pt back to room to chair; call bell w/i reach; will cont. To monitor.

## 2012-10-17 NOTE — Progress Notes (Signed)
CARDIAC REHAB PHASE I   PRE:  Rate/Rhythm: 90 afib  BP:  Supine:   Sitting: 90/50  Standing:    SaO2: 100%  RA ear   Would not register on hands  MODE:  Ambulation: 350 ft   POST:  Rate/Rhythm: 97afib  BP:  Supine:   Sitting: 96/50  Standing:    SaO2: 98% RA ear  [id number] Pt walked 350 ft on RA with rolling walker and asst x 1 with steady gait. Tolerated well on RA. Left knee less sore today. In atrial fib but heart rate stable during walk. To recliner with call bell. Dustin Nutting, RN BSN  10/17/2012 8:39 AM   [phone]

## 2012-10-18 LAB — PROTIME-INR
INR: 2 — ABNORMAL HIGH (ref 0.00–1.49)
Prothrombin Time: 22.1 seconds — ABNORMAL HIGH (ref 11.6–15.2)

## 2012-10-18 MED ORDER — ASPIRIN 81 MG PO TBEC: 81.0000 mg | DELAYED_RELEASE_TABLET | Freq: Every day | ORAL | Status: DC

## 2012-10-18 MED ORDER — SIMVASTATIN 20 MG PO TABS: 20.0000 mg | ORAL_TABLET | Freq: Every day | ORAL | Status: DC

## 2012-10-18 MED ORDER — OXYCODONE HCL 5 MG PO TABS: 5.0000 mg | ORAL_TABLET | ORAL | Status: DC | PRN

## 2012-10-18 MED ORDER — CARVEDILOL 6.25 MG PO TABS: 6.2500 mg | ORAL_TABLET | Freq: Two times a day (BID) | ORAL | Status: DC

## 2012-10-18 MED ORDER — WARFARIN SODIUM 5 MG PO TABS: 5.0000 mg | ORAL_TABLET | Freq: Every day | ORAL | Status: DC

## 2012-10-18 MED ORDER — DIGOXIN 125 MCG PO TABS: 0.1250 mg | ORAL_TABLET | Freq: Every day | ORAL | Status: DC

## 2012-10-18 MED ORDER — POTASSIUM CHLORIDE CRYS ER 20 MEQ PO TBCR: 20.0000 meq | EXTENDED_RELEASE_TABLET | Freq: Two times a day (BID) | ORAL | Status: DC

## 2012-10-18 MED ORDER — COLCHICINE 0.6 MG PO TABS: 0.6000 mg | ORAL_TABLET | Freq: Two times a day (BID) | ORAL | Status: DC

## 2012-10-18 MED ORDER — AMIODARONE HCL 400 MG PO TABS: 400.0000 mg | ORAL_TABLET | Freq: Two times a day (BID) | ORAL | Status: DC

## 2012-10-18 NOTE — Progress Notes (Signed)
Pt. Seen and examined. Agree with the NP/PA-C note as written.  Doing well on appropriate meds. Plan for discharge today. We will arrange follow-up with Dr. Allyson Sabal.  Chrystie Nose, MD, [facility] Attending Cardiologist [facility]

## 2012-10-18 NOTE — Progress Notes (Addendum)
       [address]       [place] [address]             [phone]          10 Days Post-Op Procedure(s) (LRB): CORONARY ARTERY BYPASS GRAFTING (CABG) (N/A) AORTIC VALVE REPLACEMENT (AVR) (N/A) MAZE (N/A) INTRAOPERATIVE TRANSESOPHAGEAL ECHOCARDIOGRAM (N/A) ENDOVEIN HARVEST OF GREATER SAPHENOUS VEIN (Bilateral)  Subjective: Stable, no new issues.  Objective: Vital signs in last 24 hours: Patient Vitals for the past 24 hrs:  BP Temp Temp src Pulse Resp SpO2 Weight  10/18/12 0422 115/68 mmHg 98.1 F (36.7 C) Oral 89 18 92 % 190 lb 11.2 oz (86.5 kg)  10/17/12 2019 111/55 mmHg 98.8 F (37.1 C) Oral 82 - 96 % -  10/17/12 1359 108/62 mmHg 97.7 F (36.5 C) Oral 80 18 96 % -  10/17/12 1050 111/64 mmHg - - 84 - - -   Current Weight  10/18/12 190 lb 11.2 oz (86.5 kg)     Intake/Output from previous day: 06/26 0701 - 06/27 0700 In: 840 [P.O.:840] Out: 550 [Urine:550]    PHYSICAL EXAM:  Heart: Irr irr Lungs: Clear Wound:Clean and dry Extremities: Decreased LE edema    Lab Results: CBC:No results found for this basename: WBC, HGB, HCT, PLT,  in the last 72 hours BMET:  Recent Labs  10/16/12 0520 10/17/12 0415  NA 130* 134*  K 4.6 3.8  CL 92* 97  CO2 26 29  GLUCOSE 79 82  BUN 47* 39*  CREATININE 1.52* 1.27  CALCIUM 8.2* 8.0*    PT/INR:  Recent Labs  10/18/12 0650  LABPROT 22.1*  INR 2.00*      Assessment/Plan: S/P Procedure(s) (LRB): CORONARY ARTERY BYPASS GRAFTING (CABG) (N/A) AORTIC VALVE REPLACEMENT (AVR) (N/A) MAZE (N/A) INTRAOPERATIVE TRANSESOPHAGEAL ECHOCARDIOGRAM (N/A) ENDOVEIN HARVEST OF GREATER SAPHENOUS VEIN (Bilateral) CV- rate controlled AF, continue current meds. Vol overload-  Switch to po Lasix. ARI- Cr back down. Disp- to SNF today- instructions reviewed with patient.   LOS: 32 days    COLLINS,GINA H 10/18/2012  To snf today I have seen and examined [name] and agree with the above  assessment  and plan.  Delight Ovens MD [facility] [phone] Office [phone] 10/18/2012 11:26 AM

## 2012-10-18 NOTE — Progress Notes (Signed)
Physical Therapy Treatment Patient Details Name: [name] MRN: [id number] DOB: [birth year] Today's Date: 10/18/2012 Time: 8413-2440 PT Time Calculation (min): 26 min  PT Assessment / Plan / Recommendation  PT Comments   Patient was limited by fatigue and pain in the left knee from PMH of gout. He required repeated cueing to maintain sternal precautions primarily when transferring. Patient is progressing toward PT goals and will benefit from SNF to increased muscular strength, endurance, and functional independence.   Follow Up Recommendations  Home health PT;Supervision/Assistance - 24 hour;SNF     Does the patient have the potential to tolerate intense rehabilitation     Barriers to Discharge        Equipment Recommendations  Rolling walker with 5" wheels    Recommendations for Other Services    Frequency Min 3X/week   Progress towards PT Goals Progress towards PT goals: Progressing toward goals  Plan Current plan remains appropriate    Precautions / Restrictions Precautions Precautions: Sternal Precaution Comments: educated patient on sternal precautions and techniques for following with mobility Restrictions Weight Bearing Restrictions: No   Pertinent Vitals/Pain Patient reported pain in his L knee as 3/10.    Mobility  Transfers Transfers: Sit to Stand;Stand to Sit Sit to Stand: 4: Min guard;Without upper extremity assist;From bed Stand to Sit: 4: Min guard;Without upper extremity assist;To chair/3-in-1 Details for Transfer Assistance: Performed 5x's for strengthening, activity tolerance, & technique.  Pt required no physical assistance but required VC for sternal precautions Ambulation/Gait Ambulation/Gait Assistance: 4: Min guard Ambulation Distance (Feet): 300 Feet Assistive device: Rolling walker Ambulation/Gait Assistance Details: Pt required VC to safely ambulate with RW while maintaining sternal precuations.  Patient required one standing rest break and  c/o L knee pain due to gout. Gait Pattern: Step-through pattern;Antalgic;Trunk flexed Gait velocity: decreased Stairs: No    Exercises General Exercises - Lower Extremity Ankle Circles/Pumps: AROM;Both;10 reps Long Arc Quad: AROM;Both;20 reps Hip Flexion/Marching: AROM;Both;10 reps;Seated Mini-Sqauts: AROM;10 reps;Standing Standing DLS balance with eyes closed, narrow BOS, and perturbations      PT Goals (current goals can now be found in the care plan section)    Visit Information  Last PT Received On: 10/18/12 Assistance Needed: +1    Subjective Data  Subjective: Pt recieved EOB finishing breakfast and agreeable to PT.   Cognition  Cognition Arousal/Alertness: Awake/alert Behavior During Therapy: WFL for tasks assessed/performed Overall Cognitive Status: Within Functional Limits for tasks assessed    Balance  Balance Balance Assessed: Yes  End of Session PT - End of Session Equipment Utilized During Treatment: Gait belt Activity Tolerance: Patient tolerated treatment well Patient left: in chair;with call bell/phone within reach Nurse Communication: Mobility status   GP     Jolyn Nap, SPTA 10/18/2012, 8:53 AM    Agree with above Verdell Face, PTA [phone] 10/18/2012

## 2012-10-18 NOTE — Progress Notes (Signed)
Pt given d/c instructions; IV and tele monitor d/c; pt to d/c to [facility]; brother to provide transportation to facility; will cont. To monitor.

## 2012-10-18 NOTE — Progress Notes (Signed)
Chest tube sutures removed at this time; steri strips applied to site; no oozing noted; pt tolerated well; will cont. To monitor; pt to d/c to [facility] today.

## 2012-10-18 NOTE — Progress Notes (Signed)
10:45am-11:25am  Patient was educated on sternal precautions, exercise, diet, smoking and alcohol cessation, and restrictions.  Patient is interested in cardiac rehab phase 2. Discharge video was put on.    Theresa Duty, [place] 10/18/2012 11:24 AM

## 2012-10-18 NOTE — Progress Notes (Signed)
[facility]  Subjective: No complaints. Ready for discharge to SNF.   Objective: Vital signs in last 24 hours: Temp:  [97.7 F (36.5 C)-98.8 F (37.1 C)] 98.1 F (36.7 C) (06/27 0422) Pulse Rate:  [80-89] 89 (06/27 0422) Resp:  [18] 18 (06/27 0422) BP: (108-115)/(55-68) 115/68 mmHg (06/27 0422) SpO2:  [92 %-96 %] 92 % (06/27 0422) Weight:  [190 lb 11.2 oz (86.5 kg)] 190 lb 11.2 oz (86.5 kg) (06/27 0422) Last BM Date: 10/17/12  Intake/Output from previous day: 06/26 0701 - 06/27 0700 In: 840 [P.O.:840] Out: 550 [Urine:550] Intake/Output this shift:    Medications Current Facility-Administered Medications  Medication Dose Route Frequency Provider Last Rate Last Dose  . 0.9 %  sodium chloride infusion  250 mL Intravenous PRN Delight Ovens, MD      . acetaminophen (TYLENOL) tablet 650 mg  650 mg Oral Q6H PRN Delight Ovens, MD      . amiodarone (PACERONE) tablet 400 mg  400 mg Oral BID Erin Barrett, PA-C   400 mg at 10/17/12 2215  . aspirin EC tablet 81 mg  81 mg Oral Daily Delight Ovens, MD   81 mg at 10/17/12 1051  . bisacodyl (DULCOLAX) EC tablet 10 mg  10 mg Oral Daily PRN Delight Ovens, MD       Or  . bisacodyl (DULCOLAX) suppository 10 mg  10 mg Rectal Daily PRN Delight Ovens, MD      . carvedilol (COREG) tablet 6.25 mg  6.25 mg Oral BID WC Delight Ovens, MD   6.25 mg at 10/17/12 1658  . colchicine tablet 0.6 mg  0.6 mg Oral BID Ardelle Balls, PA-C   0.6 mg at 10/17/12 2215  . digoxin (LANOXIN) tablet 0.125 mg  0.125 mg Oral Daily Delight Ovens, MD   0.125 mg at 10/17/12 1051  . docusate sodium (COLACE) capsule 200 mg  200 mg Oral Daily Delight Ovens, MD   200 mg at 10/16/12 1026  . furosemide (LASIX) injection 40 mg  40 mg Intravenous Q12H Erin Barrett, PA-C   40 mg at 10/17/12 2022  . levalbuterol (XOPENEX) nebulizer solution 0.63 mg  0.63 mg Nebulization Q6H PRN Delight Ovens, MD      . ondansetron  [facility]) tablet 4 mg  4 mg Oral Q6H PRN Delight Ovens, MD       Or  . ondansetron [facility]) injection 4 mg  4 mg Intravenous Q6H PRN Delight Ovens, MD      . oxyCODONE (Oxy IR/ROXICODONE) immediate release tablet 5-10 mg  5-10 mg Oral Q3H PRN Delight Ovens, MD   5 mg at 10/17/12 2215  . pantoprazole (PROTONIX) EC tablet 40 mg  40 mg Oral QAC breakfast Delight Ovens, MD   40 mg at 10/17/12 0753  . potassium chloride SA (K-DUR,KLOR-CON) CR tablet 20 mEq  20 mEq Oral BID Erin Barrett, PA-C   20 mEq at 10/17/12 2218  . simvastatin (ZOCOR) tablet 20 mg  20 mg Oral q1800 Delight Ovens, MD   20 mg at 10/17/12 1658  . sodium chloride 0.9 % injection 3 mL  3 mL Intravenous Q12H Delight Ovens, MD      . sodium chloride 0.9 % injection 3 mL  3 mL Intravenous PRN Delight Ovens, MD      . traMADol Janean Sark) tablet 50-100 mg  50-100 mg Oral Q4H PRN Delight Ovens, MD  100 mg at 10/14/12 1637  . warfarin (COUMADIN) tablet 2.5 mg  2.5 mg Oral Q M,W,F-1800 Erin Barrett, PA-C      . warfarin (COUMADIN) tablet 5 mg  5 mg Oral Q T,Th,S,Su-1800 Erin Barrett, PA-C   5 mg at 10/17/12 1706  . Warfarin - Physician Dosing Inpatient   Does not apply q1800 Delight Ovens, MD        PE: General appearance: alert, cooperative and no distress Lungs: clear to auscultation bilaterally Heart: irregularly irregular rhythm Extremities: 2+ bilateral pittling LEE Pulses: 2+ and symmetric Skin: warm and dry Neurologic: Grossly normal  Lab Results:  No results found for this basename: WBC, HGB, HCT, PLT,  in the last 72 hours BMET  Recent Labs  10/16/12 0520 10/17/12 0415  NA 130* 134*  K 4.6 3.8  CL 92* 97  CO2 26 29  GLUCOSE 79 82  BUN 47* 39*  CREATININE 1.52* 1.27  CALCIUM 8.2* 8.0*   PT/INR  Recent Labs  10/16/12 0520 10/17/12 0415 10/18/12 0650  LABPROT 24.2* 23.9* 22.1*  INR 2.29* 2.22* 2.00*    Assessment/Plan  Principal Problem:   Acute respiratory failure,  with BiPAP needed on admit Active Problems:   PAF (paroxysmal atrial fibrillation), he has had periods of NSR since admission   HTN (hypertension)   Hyperlipidemia   Noncompliance, no meds for two weeks prior to admission   Tobacco use   Acute combined systolic and diastolic CHF, NYHA class 3 -- previous normal LV function and moderate AS- re-evaluate; EF now 30-35% with regional WMA   Bilateral lower extremity edema, secondary to acute CHF   Aortic stenosis, severe   CAD, RCA/PDA, 30-40% LM at cath 09/20/12   Cellulitis, lower extremity- treated   PAD (peripheral artery disease), decreased bil. ABIs   Chronic obstructive pulmonary disease   Cardiomyopathy, ischemic- 30-35% 2D    Gout flare. 09/29/12, Lt knee, improved with colchicine.  Plan: Pt is stable and is being discharged to SNF today, by [facility]. Pt will go to [facility] for 2 weeks for rehab. He will follow-up with Dr. Allyson Sabal on 11/07/12. Pt was encouraged to continue smoking cessation.     LOS: 32 days    Brittainy M. Sharol Harness, PA-C 10/18/2012 9:59 AM

## 2012-10-23 ENCOUNTER — Non-Acute Institutional Stay (SKILLED_NURSING_FACILITY): Payer: Medicaid Other | Admitting: Adult Health

## 2012-10-23 ENCOUNTER — Encounter: Payer: Self-pay | Admitting: Adult Health

## 2012-10-23 DIAGNOSIS — I4891 Unspecified atrial fibrillation: Secondary | ICD-10-CM

## 2012-10-23 DIAGNOSIS — Z7901 Long term (current) use of anticoagulants: Secondary | ICD-10-CM | POA: Insufficient documentation

## 2012-10-23 DIAGNOSIS — I10 Essential (primary) hypertension: Secondary | ICD-10-CM

## 2012-10-23 DIAGNOSIS — I482 Chronic atrial fibrillation, unspecified: Secondary | ICD-10-CM | POA: Insufficient documentation

## 2012-10-23 DIAGNOSIS — I2510 Atherosclerotic heart disease of native coronary artery without angina pectoris: Secondary | ICD-10-CM

## 2012-10-23 DIAGNOSIS — E785 Hyperlipidemia, unspecified: Secondary | ICD-10-CM

## 2012-10-23 DIAGNOSIS — M109 Gout, unspecified: Secondary | ICD-10-CM

## 2012-10-23 NOTE — Progress Notes (Signed)
  Subjective:    Patient ID: [name], male    DOB: [birth year], 62 y.o.   MRN: [id number]  HPI This is a 62 year old male who has been admitted to [facility] on 10/18/12 from [facility]. He was not taking his medications for the past month. He developed progressively worsening shortness of breath, leg swelling and palpitations. S/P CABG with AVR. He has been admitted for a short-term rehabilitation. Latest INR is 2.7 - therapeutic. Coumadin was held times one day due to INR 3.3 - supratherapeutic. He is on chronic Coumadin therapy due to 2 atrial fibrillation. No complaints of chest pain nor shortness of breath.    Review of Systems  Constitutional: Negative.   HENT: Negative.   Eyes: Negative.   Respiratory: Negative for apnea, cough, chest tightness, shortness of breath and wheezing.   Cardiovascular: Positive for leg swelling. Negative for palpitations.  Gastrointestinal: Negative.   Endocrine: Negative.   Genitourinary: Negative.   Neurological: Negative.   Hematological: Negative for adenopathy. Does not bruise/bleed easily.  Psychiatric/Behavioral: Negative.        Objective:   Physical Exam  Nursing note and vitals reviewed. Constitutional: He is oriented to person, place, and time. He appears well-developed and well-nourished.  HENT:  Head: Normocephalic and atraumatic.  Right Ear: External ear normal.  Left Ear: External ear normal.  Nose: Nose normal.  Mouth/Throat: Oropharynx is clear and moist.  Eyes: Conjunctivae and EOM are normal. Pupils are equal, round, and reactive to light.  Neck: Normal range of motion. Neck supple.  Cardiovascular: Normal rate, regular rhythm, normal heart sounds and intact distal pulses.   Pulmonary/Chest: Effort normal and breath sounds normal. No respiratory distress.  Abdominal: Soft. Bowel sounds are normal.  Musculoskeletal: Normal range of motion. He exhibits edema. He exhibits no tenderness.  BLE edema, 2+   Neurological: He is alert and oriented to person, place, and time.  Skin: Skin is warm and dry.  Psychiatric: He has a normal mood and affect. His behavior is normal. Judgment and thought content normal.    LABS: 10/16/12  sodium 1:30 potassium 4.6 creatinine 1.52 glucose 79 CO2 26 10/12/12   WBC 7.4 hemoglobin 9.9 hematocrit 29.2     Medications reviewed.     Assessment & Plan:   Long term (current) use of anticoagulants - decrease Coumadin to 5 mg by mouth daily and repeat INR on 10/29/12  Atrial fibrillation with RVR - rate controlled  Gout - stable  CAD - stable  PAD (peripheral artery disease), decreased bil. ABIs - stable  HTN (hypertension) - well-controlled  Hyperlipidemia - stable  combined systolic and diastolic CHF, NYHA class 3 --  Stable; check weight 3 times per week; notify NP/M.D. for weight +- 5 lbs  Bilateral lower extremity edema - stable

## 2012-10-30 ENCOUNTER — Non-Acute Institutional Stay (SKILLED_NURSING_FACILITY): Payer: Medicaid Other | Admitting: Adult Health

## 2012-10-30 DIAGNOSIS — E785 Hyperlipidemia, unspecified: Secondary | ICD-10-CM

## 2012-10-30 DIAGNOSIS — I2510 Atherosclerotic heart disease of native coronary artery without angina pectoris: Secondary | ICD-10-CM

## 2012-10-30 DIAGNOSIS — I10 Essential (primary) hypertension: Secondary | ICD-10-CM

## 2012-10-30 DIAGNOSIS — I509 Heart failure, unspecified: Secondary | ICD-10-CM

## 2012-10-30 DIAGNOSIS — I5041 Acute combined systolic (congestive) and diastolic (congestive) heart failure: Secondary | ICD-10-CM

## 2012-10-30 DIAGNOSIS — M109 Gout, unspecified: Secondary | ICD-10-CM

## 2012-10-30 DIAGNOSIS — I4891 Unspecified atrial fibrillation: Secondary | ICD-10-CM

## 2012-10-30 DIAGNOSIS — R600 Localized edema: Secondary | ICD-10-CM

## 2012-10-30 DIAGNOSIS — R609 Edema, unspecified: Secondary | ICD-10-CM

## 2012-10-31 ENCOUNTER — Non-Acute Institutional Stay (SKILLED_NURSING_FACILITY): Payer: Medicaid Other | Admitting: Internal Medicine

## 2012-10-31 DIAGNOSIS — I10 Essential (primary) hypertension: Secondary | ICD-10-CM

## 2012-10-31 DIAGNOSIS — I509 Heart failure, unspecified: Secondary | ICD-10-CM

## 2012-10-31 DIAGNOSIS — I350 Nonrheumatic aortic (valve) stenosis: Secondary | ICD-10-CM

## 2012-10-31 DIAGNOSIS — I2510 Atherosclerotic heart disease of native coronary artery without angina pectoris: Secondary | ICD-10-CM

## 2012-10-31 DIAGNOSIS — I359 Nonrheumatic aortic valve disorder, unspecified: Secondary | ICD-10-CM

## 2012-11-01 ENCOUNTER — Encounter: Payer: Self-pay | Admitting: Adult Health

## 2012-11-01 ENCOUNTER — Non-Acute Institutional Stay (SKILLED_NURSING_FACILITY): Payer: Medicaid Other | Admitting: Adult Health

## 2012-11-01 DIAGNOSIS — Z7901 Long term (current) use of anticoagulants: Secondary | ICD-10-CM

## 2012-11-01 DIAGNOSIS — I4891 Unspecified atrial fibrillation: Secondary | ICD-10-CM

## 2012-11-01 NOTE — Progress Notes (Signed)
  Subjective:    Patient ID: [name], male    DOB: [birth year], 62 y.o.   MRN: [id number]  HPI  This is a 62 year old male who is for discharge home with home health PT. has been admitted to [facility] on 10/18/12 from [facility]. He was not taking his medications for the past month. He developed progressively worsening shortness of breath, leg swelling and palpitations. S/P CABG with AVR. He has completed SNF and rehabilitation and therapy has cleared the patient for discharge.    Review of Systems  Constitutional: Negative.   HENT: Negative.   Eyes: Negative.   Respiratory: Negative for apnea, cough, chest tightness, shortness of breath and wheezing.   Cardiovascular: Positive for leg swelling. Negative for palpitations.  Gastrointestinal: Negative.   Endocrine: Negative.   Genitourinary: Negative.   Neurological: Negative.   Hematological: Negative for adenopathy. Does not bruise/bleed easily.  Psychiatric/Behavioral: Negative.        Objective:   Physical Exam  Nursing note and vitals reviewed. Constitutional: He is oriented to person, place, and time. He appears well-developed and well-nourished.  HENT:  Head: Normocephalic and atraumatic.  Right Ear: External ear normal.  Left Ear: External ear normal.  Nose: Nose normal.  Mouth/Throat: Oropharynx is clear and moist.  Eyes: Conjunctivae and EOM are normal. Pupils are equal, round, and reactive to light.  Neck: Normal range of motion. Neck supple. No tracheal deviation present. No thyromegaly present.  Cardiovascular: Normal rate, regular rhythm, normal heart sounds and intact distal pulses.   Pulmonary/Chest: Effort normal and breath sounds normal. No respiratory distress.  Abdominal: Soft. Bowel sounds are normal.  Musculoskeletal: Normal range of motion. He exhibits edema. He exhibits no tenderness.  BLE edema, 2+  Neurological: He is alert and oriented to person, place, and time.  Skin: Skin is warm  and dry.  Psychiatric: He has a normal mood and affect. His behavior is normal. Judgment and thought content normal.    LABS: 11/01/12 WBC 2.8 hemoglobin 8.9 hematocrit 26.9 10/24/12 WBC 5.1 hemoglobin 9.2 hematocrit 27.8 sodium 138 potassium 5.0 glucose 74 BUN 16 creatinine 1.26 calcium 8.5 the digoxin 1.1 10/16/12  sodium 1:30 potassium 4.6 creatinine 1.52 glucose 79 CO2 26 10/12/12   WBC 7.4 hemoglobin 9.9 hematocrit 29.2     Medications reviewed.     Assessment & Plan:   Atrial fibrillation with RVR - rate controlled  Gout - stable; change colchicine 0.6 mg  PO twice a day to When necessary  CAD - stable  HTN (hypertension) - well-controlled  Hyperlipidemia - stable  combined systolic and diastolic CHF, NYHA class 3 --  Stable  Bilateral lower extremity edema - stable    Total discharge time: <30 minutes Discharge time involved coordination of the discharge process with social worker, nursing staff and therapy department. Medical justification for home health services verified.   CPT CODE:  40981

## 2012-11-01 NOTE — Progress Notes (Signed)
Patient ID: [name], male   DOB: [birth year], 62 y.o.   MRN: [id number] Subjective:     Indication: atrial fibrillation Bleeding signs/symptoms: None Thromboembolic signs/symptoms: None  Missed Coumadin doses: This week - 3 days due to therapeutic INR Medication changes: no Dietary changes: no Bacterial/viral infection: no Other concerns: no  The following portions of the patient's history were reviewed and updated as appropriate: allergies, current medications, past family history, past medical history, past social history, past surgical history and problem list.  Review of Systems A comprehensive review of systems was negative.   Objective:    INR Today: 2.2 Current dose:   Coumadin held x3 days  Assessment:    Therapeutic INR for goal of 2-3   Plan:    1. New dose: Decrease Coumadin to 4 mg by mouth daily   2. Next INR:   11/05/12

## 2012-11-03 DIAGNOSIS — I509 Heart failure, unspecified: Secondary | ICD-10-CM

## 2012-11-03 DIAGNOSIS — I2510 Atherosclerotic heart disease of native coronary artery without angina pectoris: Secondary | ICD-10-CM

## 2012-11-03 DIAGNOSIS — Z48812 Encounter for surgical aftercare following surgery on the circulatory system: Secondary | ICD-10-CM

## 2012-11-03 DIAGNOSIS — I5040 Unspecified combined systolic (congestive) and diastolic (congestive) heart failure: Secondary | ICD-10-CM

## 2012-11-07 ENCOUNTER — Ambulatory Visit (INDEPENDENT_AMBULATORY_CARE_PROVIDER_SITE_OTHER): Payer: Medicaid Other | Admitting: Cardiovascular Disease

## 2012-11-07 ENCOUNTER — Encounter: Payer: Self-pay | Admitting: Cardiovascular Disease

## 2012-11-07 ENCOUNTER — Ambulatory Visit (INDEPENDENT_AMBULATORY_CARE_PROVIDER_SITE_OTHER): Payer: Medicaid Other | Admitting: Pharmacist Clinician (PhC)/ Clinical Pharmacy Specialist

## 2012-11-07 VITALS — BP 140/80 | HR 59 | Ht 70.0 in | Wt 193.0 lb

## 2012-11-07 DIAGNOSIS — I10 Essential (primary) hypertension: Secondary | ICD-10-CM

## 2012-11-07 DIAGNOSIS — Z79899 Other long term (current) drug therapy: Secondary | ICD-10-CM

## 2012-11-07 DIAGNOSIS — Z954 Presence of other heart-valve replacement: Secondary | ICD-10-CM

## 2012-11-07 DIAGNOSIS — R0602 Shortness of breath: Secondary | ICD-10-CM

## 2012-11-07 DIAGNOSIS — Z7901 Long term (current) use of anticoagulants: Secondary | ICD-10-CM

## 2012-11-07 DIAGNOSIS — I4891 Unspecified atrial fibrillation: Secondary | ICD-10-CM

## 2012-11-07 DIAGNOSIS — I2510 Atherosclerotic heart disease of native coronary artery without angina pectoris: Secondary | ICD-10-CM

## 2012-11-07 DIAGNOSIS — Z952 Presence of prosthetic heart valve: Secondary | ICD-10-CM

## 2012-11-07 LAB — BASIC METABOLIC PANEL
BUN: 11 mg/dL (ref 6–23)
Potassium: 4.8 mEq/L (ref 3.5–5.3)

## 2012-11-07 LAB — POCT INR: INR: 2.3

## 2012-11-07 MED ORDER — AMIODARONE HCL 200 MG PO TABS: 200.0000 mg | ORAL_TABLET | Freq: Every day | ORAL | Status: DC

## 2012-11-07 MED ORDER — FUROSEMIDE 20 MG PO TABS: ORAL_TABLET | ORAL | Status: DC

## 2012-11-07 NOTE — Progress Notes (Signed)
11/07/2012 [name]   02-26-1951  [id number]  Primary Physician No PCP Per Patient Primary Cardiologist: Runell Gess MD [name]   HPI:  62 year old WM with progressively worsening shortness of breath, leg swelling and palpitations for the past week. States he is out of his medications for the past month secondary to not be able to afford them. Denies any chest pain. States he has a history of CHF and atrial fibrillation. He arrived in moderate respiratory distress with abdominal breathing and crackles throughout. A. fib with RVR in the 150s. Denies any chest pain, cough or fever. good by mouth intake. States he has not seen his cardiologist Dr.Levonia Wolfley in more than a year. He was tachypneic with pursed lip breathing with rales. Bipap applied. IV Lasix given. EKG with A fib with RVR has been given cardiazem as well.  History of Moderate AS on echo 05/2011 and normal LV function. Also concentric LVH. He has atrial fib and on last visit with Dr. Allyson Sabal he was in atrial fib. Dr. Allyson Sabal had placed Lt iliac stent but pt then had aorto-bifemoral bypass grafting by Dr. Myra Gianotti. Negative nuc study in 2010. Hx of ETOH withdrawal in past. He was admitted in congestive heart failure and underwent diuresis for the last several days. He has lost approximately is comfortable lying flat. His renal functions remained stable. He's been in A. Fib with a controlled ventricular response. 2-D echo revealed severe LV dysfunction with an aortic stenosis and a valve area measured at 0.8 cm.I performed cardiac catheterization on him 09/20/12 revealing one-vessel CAD of his native dominant right coronary artery as well as severe aortic stenosis. He ultimately underwent coronary artery bypass grafting x1 with a mean his RCA, pericardial AP R. With a #23 magnesium pericardial valve performed by Dr. Tyrone Sage, right and left knees procedures and left atrial appendage ligation. His hospitalization lasted 33 days.  He spent the next 2 weeks at [facility] rehabilitation facility and I spell at home for one week.   Current Outpatient Prescriptions  Medication Sig Dispense Refill  . amiodarone (PACERONE) 400 MG tablet Take 1 tablet (400 mg total) by mouth 2 (two) times daily. X 1 week, then decrease to 200 mg po daily      . carvedilol (COREG) 6.25 MG tablet Take 1 tablet (6.25 mg total) by mouth 2 (two) times daily with a meal.      . colchicine 0.6 MG tablet Take 0.6 mg by mouth 2 (two) times daily as needed.      . digoxin (LANOXIN) 0.125 MG tablet Take 1 tablet (0.125 mg total) by mouth daily.      Marland Kitchen oxyCODONE (OXY IR/ROXICODONE) 5 MG immediate release tablet Take 1-2 tablets (5-10 mg total) by mouth every 3 (three) hours as needed for pain.  30 tablet  0  . potassium chloride SA (K-DUR,KLOR-CON) 20 MEQ tablet Take 1 tablet (20 mEq total) by mouth 2 (two) times daily.      . simvastatin (ZOCOR) 20 MG tablet Take 1 tablet (20 mg total) by mouth at bedtime.  30 tablet    . warfarin (COUMADIN) 4 MG tablet Take 4 mg by mouth daily.       No current facility-administered medications for this visit.    No Known Allergies  History   Social History  . Marital Status: Divorced    Spouse Name: N/A    Number of Children: N/A  . Years of Education: N/A   Occupational History  .  Not on file.   Social History Main Topics  . Smoking status: Former Smoker -- 1.00 packs/day for 43 years    Types: Cigarettes    Quit date: 09/12/2012  . Smokeless tobacco: Never Used     Comment: More than 43 + pack years as he smoked up to 2 1/2 ppd for many years. Had a period of cessation after femoral stent.  . Alcohol Use: 16.8 oz/week    28 Cans of beer per week  . Drug Use: No  . Sexually Active: Not on file   Other Topics Concern  . Not on file   Social History Narrative  . No narrative on file     Review of Systems: General: negative for chills, fever, night sweats or weight changes.  Cardiovascular:  negative for chest pain, dyspnea on exertion, edema, orthopnea, palpitations, paroxysmal nocturnal dyspnea or shortness of breath Dermatological: negative for rash Respiratory: negative for cough or wheezing Urologic: negative for hematuria Abdominal: negative for nausea, vomiting, diarrhea, bright red blood per rectum, melena, or hematemesis Neurologic: negative for visual changes, syncope, or dizziness All other systems reviewed and are otherwise negative except as noted above.    Blood pressure 140/80, pulse 59, height 5\' 10"  (1.778 m), weight 193 lb (87.544 kg).  General appearance: alert and no distress Neck: no adenopathy, no JVD, supple, symmetrical, trachea midline, thyroid not enlarged, symmetric, no tenderness/mass/nodules and soft bilateral carotid bruits Lungs: clear to auscultation bilaterally Heart: regular rate and rhythm, S1, S2 normal, no murmur, click, rub or gallop Extremities: 2-3+ pitting edema bilaterally  EKG sinus bradycardia at 59 with nonspecific ST and T wave changes  ASSESSMENT AND PLAN:   Atrial fibrillation with RVR Status post surgical Maze procedure currently in sinus rhythm on Coumadin and amiodarone  Aortic stenosis, severe Patient had cardiac catheterization performed by myself 09/20/12 revealing severe aortic stenosis. He also had one-vessel coronary artery disease. He underwent aortic valve replacement with a #23 magna ease of pericardial valve performed by Dr. Ofilia Neas. He had a surgical maze procedure of his right and left atrium as well as the left atrial appendage-like ligation. Consultation lasted 33 days and he spent 2 weeks in a rehabilitation facility, and has been home for one week. He's had progressive lower extremity edema which will treat by beginning him on oral diuretic and rechecking labs. He'll see 8 level provider back in one week and me back in 3 months. I'm also going to get a 2-D echo for LV function and his aortic  valve.      Runell Gess MD FACP,FACC,FAHA, [facility] 11/07/2012 10:11 AM

## 2012-11-07 NOTE — Assessment & Plan Note (Signed)
Status post surgical Maze procedure currently in sinus rhythm on Coumadin and amiodarone

## 2012-11-07 NOTE — Patient Instructions (Addendum)
  We will see you back in follow up in 3 months with Dr Allyson Sabal and next week with an extender.  Dr Allyson Sabal has ordered an echocardiogram  I have placed an order for cardiac rehab to contact you about starting the program.  Have bloodwork done today and next week   Start Lasix 20mg -2 tablets daily for the next 3 days, then one tablet daily  Decrease Amiodarone to 200mg  daily

## 2012-11-07 NOTE — Assessment & Plan Note (Signed)
Patient had cardiac catheterization performed by myself 09/20/12 revealing severe aortic stenosis. He also had one-vessel coronary artery disease. He underwent aortic valve replacement with a #23 magna ease of pericardial valve performed by Dr. Ofilia Neas. He had a surgical maze procedure of his right and left atrium as well as the left atrial appendage-like ligation. Consultation lasted 33 days and he spent 2 weeks in a rehabilitation facility, and has been home for one week. He's had progressive lower extremity edema which will treat by beginning him on oral diuretic and rechecking labs. He'll see 8 level provider back in one week and me back in 3 months. I'm also going to get a 2-D echo for LV function and his aortic valve.

## 2012-11-08 LAB — BRAIN NATRIURETIC PEPTIDE: Brain Natriuretic Peptide: 1246.9 pg/mL — ABNORMAL HIGH (ref 0.0–100.0)

## 2012-11-11 ENCOUNTER — Ambulatory Visit (HOSPITAL_COMMUNITY)
Admission: RE | Admit: 2012-11-11 | Discharge: 2012-11-11 | Disposition: A | Discharge status: Home / Self Care | Payer: Medicaid Other | Source: Ambulatory Visit | Attending: Cardiology | Admitting: Cardiology

## 2012-11-11 ENCOUNTER — Telehealth: Payer: Self-pay | Admitting: Cardiovascular Disease

## 2012-11-11 ENCOUNTER — Ambulatory Visit
Admission: RE | Admit: 2012-11-11 | Discharge: 2012-11-11 | Disposition: A | Discharge status: Home / Self Care | Payer: No Typology Code available for payment source | Source: Ambulatory Visit | Attending: Cardiothoracic Surgery | Admitting: Cardiothoracic Surgery

## 2012-11-11 ENCOUNTER — Ambulatory Visit (INDEPENDENT_AMBULATORY_CARE_PROVIDER_SITE_OTHER): Payer: Self-pay | Admitting: Physician Assistant

## 2012-11-11 VITALS — BP 121/52 | HR 62 | Resp 16 | Ht 70.0 in | Wt 192.0 lb

## 2012-11-11 DIAGNOSIS — I4891 Unspecified atrial fibrillation: Secondary | ICD-10-CM

## 2012-11-11 DIAGNOSIS — I359 Nonrheumatic aortic valve disorder, unspecified: Secondary | ICD-10-CM

## 2012-11-11 DIAGNOSIS — I2510 Atherosclerotic heart disease of native coronary artery without angina pectoris: Secondary | ICD-10-CM

## 2012-11-11 DIAGNOSIS — Z8679 Personal history of other diseases of the circulatory system: Secondary | ICD-10-CM

## 2012-11-11 DIAGNOSIS — Z09 Encounter for follow-up examination after completed treatment for conditions other than malignant neoplasm: Secondary | ICD-10-CM | POA: Insufficient documentation

## 2012-11-11 DIAGNOSIS — I10 Essential (primary) hypertension: Secondary | ICD-10-CM | POA: Insufficient documentation

## 2012-11-11 DIAGNOSIS — Z954 Presence of other heart-valve replacement: Secondary | ICD-10-CM

## 2012-11-11 DIAGNOSIS — Z952 Presence of prosthetic heart valve: Secondary | ICD-10-CM

## 2012-11-11 DIAGNOSIS — Z951 Presence of aortocoronary bypass graft: Secondary | ICD-10-CM

## 2012-11-11 DIAGNOSIS — I350 Nonrheumatic aortic (valve) stenosis: Secondary | ICD-10-CM

## 2012-11-11 DIAGNOSIS — Z9889 Other specified postprocedural states: Secondary | ICD-10-CM

## 2012-11-11 MED ORDER — SIMVASTATIN 20 MG PO TABS: 20.0000 mg | ORAL_TABLET | Freq: Every day | ORAL | Status: DC

## 2012-11-11 MED ORDER — CARVEDILOL 6.25 MG PO TABS: 6.2500 mg | ORAL_TABLET | Freq: Two times a day (BID) | ORAL | Status: DC

## 2012-11-11 MED ORDER — FUROSEMIDE 20 MG PO TABS: ORAL_TABLET | ORAL | Status: DC

## 2012-11-11 MED ORDER — POTASSIUM CHLORIDE CRYS ER 20 MEQ PO TBCR: 20.0000 meq | EXTENDED_RELEASE_TABLET | Freq: Two times a day (BID) | ORAL | Status: DC

## 2012-11-11 MED ORDER — DIGOXIN 125 MCG PO TABS: 0.1250 mg | ORAL_TABLET | Freq: Every day | ORAL | Status: DC

## 2012-11-11 MED ORDER — AMIODARONE HCL 200 MG PO TABS: 200.0000 mg | ORAL_TABLET | Freq: Every day | ORAL | Status: DC

## 2012-11-11 MED ORDER — WARFARIN SODIUM 4 MG PO TABS: 4.0000 mg | ORAL_TABLET | Freq: Every day | ORAL | Status: DC

## 2012-11-11 NOTE — Progress Notes (Signed)
[address]       [name] [address]             [phone]          HPI: Patient returns for routine postoperative follow-up having undergone AVR (23 mm Magna Ease pericardial valve), CABG x 2, right and left Maze, and placement of left atrial clip by Dr. Tyrone Sage on 10/08/2012.  The patient's postoperative course was notable for atrial fibrillation, which was treated with Amiodarone and Digoxin, as well as Coumadin and a beta blocker.  He did convert to sinus rhythm, but had recurrent rate controlled AF, which was stable at the time of discharge.  He was deconditioned postop and had issues with mobility due to gout.  He ultimately was discharged to a skilled nursing facility on 10/18/2012 for further rehab.  [name] remained at [facility] for 2 weeks post discharge, and has now been back at home for 1 week.  He reports doing well since discharge.  He is progressing well with mobility, and physical therapy feels he is ready to transition to outpatient cardiac rehab.  His appetite is excellent, and he denies chest pain or shortness of breath.  His only complaint has been increasing lower extremity edema since completing Lasix.  He saw Dr. Allyson Sabal last week, and was restarted on a 30 day course of Lasix, and the patient notes that he lost 12 pounds in the first 3 days on the medication.  He is overall feeling well and has no complaints today.    Current Outpatient Prescriptions  Medication Sig Dispense Refill  . amiodarone (PACERONE) 200 MG tablet Take 1 tablet (200 mg total) by mouth daily.  30 tablet  6  . carvedilol (COREG) 6.25 MG tablet Take 1 tablet (6.25 mg total) by mouth 2 (two) times daily with a meal.      . digoxin (LANOXIN) 0.125 MG tablet Take 1 tablet (0.125 mg total) by mouth daily.      . furosemide (LASIX) 20 MG tablet Take two tablets daily for 3 days, then one tablet daily  35 tablet  6  . potassium chloride SA (K-DUR,KLOR-CON) 20 MEQ tablet  Take 1 tablet (20 mEq total) by mouth 2 (two) times daily.      . simvastatin (ZOCOR) 20 MG tablet Take 1 tablet (20 mg total) by mouth at bedtime.  30 tablet    . warfarin (COUMADIN) 4 MG tablet Take 4 mg by mouth daily. OR AS DIRECTED       No current facility-administered medications for this visit.     Physical Exam: BP 121/52 HR 62 Resp 16 Wounds: Sternal incision and bilateral EVH wounds are well healed.  Sternum is stable to palpation. Heart: regular rate and rhythm Lungs: Clear Extremities: 1-2+ bilateral lower extremity edema   Diagnostic Tests: Chest xray: Dg Chest 2 View  11/11/2012   *RADIOLOGY REPORT*  Clinical Data: Cardiac surgery 5 weeks ago.  Maze procedure. Aortic valve replacement.  CHEST - 2 VIEW  Comparison: 10/12/2012.  Findings: The cardiopericardial silhouette is within normal limits. The there is underlying emphysema.  Left atrial clip and bioprosthetic aortic valve is present.  Scattered areas of subsegmental atelectasis are present.  The pleural effusions have resolved.  There is no airspace disease. Hazy opacities present over the right lung base which is most compatible with atelectasis seen en face. Thoracic spondylosis is mild.  Median sternotomy wires.  IMPRESSION: Normalizing postoperative chest  radiograph.  Resolution of pleural effusions.  Scattered areas of subsegmental atelectasis and / or scarring.   Original Report Authenticated By: Andreas Newport, M.D.       Assessment/Plan: [name] is progressing well status post AVR/CABG/Maze.  He is currently in sinus rhythm both by exam and rhythm strip.  He has had recurrent issues with volume overload, and was restarted on Lasix, and appears to be diuresing well.  From a surgical standpoint, he is healing well.  He may begin driving at this point and increasing his activity as tolerated.  I have encouraged him to proceed with outpatient cardiac rehab once he is discharged by home health PT, and he is  interested.  He is scheduled for an echo tomorrow with follow up with [facility] thereafter. We will see him back in our office as needed.

## 2012-11-11 NOTE — Telephone Encounter (Signed)
Returning your call. °

## 2012-11-11 NOTE — Progress Notes (Signed)
2D Echo Performed 11/11/2012    Niketa Turner, RCS  

## 2012-11-11 NOTE — Telephone Encounter (Signed)
Spoke with patient.

## 2012-11-13 ENCOUNTER — Encounter: Payer: Self-pay | Admitting: Cardiology

## 2012-11-13 ENCOUNTER — Telehealth: Payer: Self-pay | Admitting: Cardiovascular Disease

## 2012-11-13 ENCOUNTER — Ambulatory Visit (INDEPENDENT_AMBULATORY_CARE_PROVIDER_SITE_OTHER): Payer: Medicaid Other | Admitting: Cardiology

## 2012-11-13 VITALS — BP 122/72 | HR 105 | Ht 70.0 in | Wt 181.1 lb

## 2012-11-13 DIAGNOSIS — I4891 Unspecified atrial fibrillation: Secondary | ICD-10-CM

## 2012-11-13 DIAGNOSIS — I480 Paroxysmal atrial fibrillation: Secondary | ICD-10-CM

## 2012-11-13 DIAGNOSIS — I359 Nonrheumatic aortic valve disorder, unspecified: Secondary | ICD-10-CM

## 2012-11-13 DIAGNOSIS — J449 Chronic obstructive pulmonary disease, unspecified: Secondary | ICD-10-CM

## 2012-11-13 DIAGNOSIS — I350 Nonrheumatic aortic (valve) stenosis: Secondary | ICD-10-CM

## 2012-11-13 DIAGNOSIS — I4892 Unspecified atrial flutter: Secondary | ICD-10-CM

## 2012-11-13 DIAGNOSIS — Z7901 Long term (current) use of anticoagulants: Secondary | ICD-10-CM

## 2012-11-13 DIAGNOSIS — I2510 Atherosclerotic heart disease of native coronary artery without angina pectoris: Secondary | ICD-10-CM

## 2012-11-13 NOTE — Assessment & Plan Note (Signed)
Coumadin 

## 2012-11-13 NOTE — Assessment & Plan Note (Signed)
Elevated HR noted at rehab, he is asymptomatic, HR 105

## 2012-11-13 NOTE — Telephone Encounter (Signed)
Message forwarded to K. Vogel, RN.  

## 2012-11-13 NOTE — Assessment & Plan Note (Signed)
Severe

## 2012-11-13 NOTE — Assessment & Plan Note (Signed)
Doing well 

## 2012-11-13 NOTE — Progress Notes (Signed)
11/13/2012 [name]   [birth year]  [id number]  Primary Physicia No PCP Per Patient Primary Cardiologist: Dr Allyson Sabal  HPI:  62 y/o we saw in May 2014 with respiratory failure. Work up revealed CAD (RCA) and severe AS. He had a tissue AVR placed as well as CABG X 2 to the PL and PDA. He significant problems with PAF and COPD. He had a Maze at the time of his heart surgery. He was discharge to [facility] in [place] on Coumadin and Amiodarone. He saw Dr Allyson Sabal in follow up earlier this month and was doing well. He has stopped smoking. He was at rehab today when they noted his HR to be 110. He was asymptomatic with this. Previously his HR had been around 60. In the office he is in atrial flutter.   Current Outpatient Prescriptions  Medication Sig Dispense Refill  . amiodarone (PACERONE) 200 MG tablet Take 1 tablet (200 mg total) by mouth daily.  90 tablet  1  . carvedilol (COREG) 6.25 MG tablet Take 1 tablet (6.25 mg total) by mouth 2 (two) times daily with a meal.  180 tablet  3  . digoxin (LANOXIN) 0.125 MG tablet Take 1 tablet (0.125 mg total) by mouth daily.  90 tablet  3  . furosemide (LASIX) 20 MG tablet Take two tablets daily for 3 days, then one tablet daily  35 tablet  6  . potassium chloride SA (K-DUR,KLOR-CON) 20 MEQ tablet Take 1 tablet (20 mEq total) by mouth 2 (two) times daily.  30 tablet  3  . simvastatin (ZOCOR) 20 MG tablet Take 1 tablet (20 mg total) by mouth at bedtime.  90 tablet  3  . warfarin (COUMADIN) 4 MG tablet Take 1 tablet (4 mg total) by mouth daily. OR AS DIRECTED  90 tablet  1   No current facility-administered medications for this visit.    No Known Allergies  History   Social History  . Marital Status: Divorced    Spouse Name: N/A    Number of Children: N/A  . Years of Education: N/A   Occupational History  . Not on file.   Social History Main Topics  . Smoking status: Former Smoker -- 1.00 packs/day for 43 years    Types: Cigarettes    Quit date:  09/12/2012  . Smokeless tobacco: Never Used     Comment: More than 43 + pack years as he smoked up to 2 1/2 ppd for many years. Had a period of cessation after femoral stent.  . Alcohol Use: 16.8 oz/week    28 Cans of beer per week  . Drug Use: No  . Sexually Active: Not on file   Other Topics Concern  . Not on file   Social History Narrative  . No narrative on file     Review of Systems: General: negative for chills, fever, night sweats or weight changes.  Cardiovascular: negative for chest pain, dyspnea on exertion, edema, orthopnea, palpitations, paroxysmal nocturnal dyspnea or shortness of breath Dermatological: negative for rash Respiratory: negative for cough or wheezing Urologic: negative for hematuria Abdominal: negative for nausea, vomiting, diarrhea, bright red blood per rectum, melena, or hematemesis Neurologic: negative for visual changes, syncope, or dizziness All other systems reviewed and are otherwise negative except as noted above.    Blood pressure 122/72, pulse 105, height 5\' 10"  (1.778 m), weight 181 lb 1.6 oz (82.146 kg).  General appearance: alert, cooperative, appears older than stated age and no distress Lungs: decreased  breath sounds Heart: regularly irregular rhythm  EKG  A flutter with VR 105  ASSESSMENT AND PLAN:   Atrial flutter Elevated HR noted at rehab, he is asymptomatic, HR 105  PAF - Maze at the time of his CABG/AVR 5/14. On Amiodarone PAF post op, discharged in [place] on Amiodarone  CAD, CABG X 2 with SVG-PD/PL 5/14 Doing well  Chronic obstructive pulmonary disease Severe  Chronic anticoagulation Coumadin   PLAN  I reviewed [name]'s EKG with Dr Royann Shivers. We will increase his Amiodarone to 200 mg BID for 7 days and then have him return for follow up.   [facility] KPA-C 11/13/2012 3:53 PM

## 2012-11-13 NOTE — Patient Instructions (Addendum)
Take Amiodarone 200mg  BID for 7 days. Return on the 29th

## 2012-11-13 NOTE — Telephone Encounter (Signed)
KiKi (physical therapist with [facility] called while at patient's home.  She advised that [name]'s resting heart rate was 106.  He is aympotomatic with a blood pressure of 122/68.  I advised KiKi that Dustin Poke has Afib and is on Amiodarone and warfarin.  Proceed with exercise as long as heart rate doesn't increase to >120bpm.

## 2012-11-13 NOTE — Telephone Encounter (Signed)
I spoke with Corine Shelter PAC.  He wants patient to come in this afternoon for an office visit.  I called patient and left message for him to call me back

## 2012-11-13 NOTE — Assessment & Plan Note (Signed)
PAF post op, discharged in [place] on Amiodarone

## 2012-11-13 NOTE — Telephone Encounter (Signed)
Patient returned my call and will come in to see Franky Macho this pm

## 2012-11-13 NOTE — Telephone Encounter (Signed)
Returned call.  Left message to call back before 4pm.  

## 2012-11-14 ENCOUNTER — Ambulatory Visit (INDEPENDENT_AMBULATORY_CARE_PROVIDER_SITE_OTHER): Payer: Medicaid Other | Admitting: Pulmonary Disease

## 2012-11-14 ENCOUNTER — Encounter: Payer: Self-pay | Admitting: Pulmonary Disease

## 2012-11-14 VITALS — BP 128/80 | HR 105 | Temp 97.2°F | Ht 69.5 in | Wt 182.6 lb

## 2012-11-14 DIAGNOSIS — J438 Other emphysema: Secondary | ICD-10-CM

## 2012-11-14 DIAGNOSIS — J439 Emphysema, unspecified: Secondary | ICD-10-CM

## 2012-11-14 NOTE — Progress Notes (Signed)
  Subjective:    Patient ID: [name], male    DOB: [birth year], 62 y.o.   MRN: [id number]  HPI The patient is a 62 year old male who I've been asked to see for management of COPD.  He recently was in the hospital with coronary disease and aortic valvular disease, and underwent CABG and aortic valve replacement.  He has a long history of tobacco abuse and dyspnea, and his spirometry preoperatively showed moderate to severe airflow obstruction.  He has done well after his surgery, and has quit smoking.  He feels that his breathing is much improved compared to 6 months ago.  He continues to have dyspnea on exertion that he believes is more related to endurance issues.  He has no significant cough or mucus at this time, and is not using supplemental oxygen.  He has had some lower extremity edema, and has been placed on diuretics by cardiology.  He has also had issues with atrial fibrillation, and was recently started on amiodarone.  He has not had full pulmonary function studies.   Review of Systems  Constitutional: Negative for fever and unexpected weight change.  HENT: Negative for ear pain, nosebleeds, congestion, sore throat, rhinorrhea, sneezing, trouble swallowing, dental problem, postnasal drip and sinus pressure.   Eyes: Negative for redness and itching.  Respiratory: Positive for cough and shortness of breath. Negative for chest tightness and wheezing.   Cardiovascular: Negative for palpitations and leg swelling.  Gastrointestinal: Negative for nausea and vomiting.  Genitourinary: Negative for dysuria.  Musculoskeletal: Negative for joint swelling.  Skin: Negative for rash.  Neurological: Negative for headaches.  Hematological: Does not bruise/bleed easily.  Psychiatric/Behavioral: Negative for dysphoric mood. The patient is not nervous/anxious.        Objective:   Physical Exam Constitutional:  Well developed, no acute distress  HENT:  Nares patent without  discharge  Oropharynx without exudate, palate and uvula are normal  Eyes:  Perrla, eomi, no scleral icterus  Neck:  No JVD, no TMG  Cardiovascular:  Normal rate, regular rhythm, no rubs or gallops.  No murmurs        Intact distal pulses  Pulmonary : mildly decreased breath sounds, no stridor or respiratory distress   No rales, rhonchi, or wheezing  Abdominal:  Soft, nondistended, bowel sounds present.  No tenderness noted.   Musculoskeletal:  1+ lower extremity edema noted.  Lymph Nodes:  No cervical lymphadenopathy noted  Skin:  No cyanosis noted  Neurologic:  Alert, appropriate, moves all 4 extremities without obvious deficit.         Assessment & Plan:

## 2012-11-14 NOTE — Patient Instructions (Addendum)
Will start on anoro one inhalation each am each day everyday no matter what.  Rinse mouth after using.  Continue to stay off cigarettes.  You are doing great. Would like to see you back in 4 weeks with breathing studies on the same day.

## 2012-11-14 NOTE — Assessment & Plan Note (Signed)
The patient has a history of significant airflow obstruction on spirometry, however he has now quit smoking and feels that he is doing better after his open-heart surgery.  He is currently not on any type of bronchodilator regimen, and we'll therefore start him on a LABA/LA MA combination.  I would like to do full pulmonary function studies to get some idea of the severity of his disease.  I suspect his numbers have improved since smoking cessation.  I will see him back on the day of his breathing studies for review

## 2012-11-19 ENCOUNTER — Ambulatory Visit: Payer: Self-pay | Admitting: Cardiology

## 2012-11-19 ENCOUNTER — Ambulatory Visit: Payer: Self-pay | Admitting: Pharmacist Clinician (PhC)/ Clinical Pharmacy Specialist

## 2012-11-21 ENCOUNTER — Ambulatory Visit (INDEPENDENT_AMBULATORY_CARE_PROVIDER_SITE_OTHER): Payer: Medicaid Other | Admitting: Cardiology

## 2012-11-21 ENCOUNTER — Encounter: Payer: Self-pay | Admitting: Cardiology

## 2012-11-21 ENCOUNTER — Ambulatory Visit (INDEPENDENT_AMBULATORY_CARE_PROVIDER_SITE_OTHER): Payer: Medicaid Other | Admitting: Pharmacist Clinician (PhC)/ Clinical Pharmacy Specialist

## 2012-11-21 VITALS — BP 118/62 | HR 103 | Ht 70.0 in | Wt 183.3 lb

## 2012-11-21 DIAGNOSIS — I4892 Unspecified atrial flutter: Secondary | ICD-10-CM

## 2012-11-21 DIAGNOSIS — I350 Nonrheumatic aortic (valve) stenosis: Secondary | ICD-10-CM

## 2012-11-21 DIAGNOSIS — Z7901 Long term (current) use of anticoagulants: Secondary | ICD-10-CM

## 2012-11-21 DIAGNOSIS — I2510 Atherosclerotic heart disease of native coronary artery without angina pectoris: Secondary | ICD-10-CM

## 2012-11-21 DIAGNOSIS — J439 Emphysema, unspecified: Secondary | ICD-10-CM

## 2012-11-21 DIAGNOSIS — I4891 Unspecified atrial fibrillation: Secondary | ICD-10-CM

## 2012-11-21 DIAGNOSIS — J438 Other emphysema: Secondary | ICD-10-CM

## 2012-11-21 DIAGNOSIS — I359 Nonrheumatic aortic valve disorder, unspecified: Secondary | ICD-10-CM

## 2012-11-21 NOTE — Progress Notes (Signed)
11/21/2012 [name]   February 14, 1951  [id number]  Primary Physicia No PCP Per Patient Primary Cardiologist: Dr Allyson Sabal  HPI:  62 y/o we saw in May 2014 with respiratory failure. Work up revealed CAD (RCA) and severe AS. He had a tissue AVR placed as well as CABG X 2 to the PL and PDA. He significant problems with PAF and COPD. He had a prolonged hospitalization. He had a Maze at the time of his heart surgery. He was discharge to [facility] in [place] on Coumadin and Amiodarone. He was seen in the office 11/13/12 as a referral from cardiac rehab for tachycardia. He was asymptomatic. In the office an EKG confirmed atrial flutter. We increased his Amiodarone to 200 mg BID and are now seeing him back. He remains asymptomatic in atrial flutter with 2:1 conduction and a VR of 110. I discussed this with Dr Allyson Sabal. The plan is for 4 weeks of documented therapeutic INR and then an OP cardioversion. For now we'll keep him on BID Amiodarone. His INR today was 2.3.     Current Outpatient Prescriptions  Medication Sig Dispense Refill  . amiodarone (PACERONE) 200 MG tablet Take 200 mg by mouth 2 (two) times daily. x 7 days      . carvedilol (COREG) 6.25 MG tablet Take 1 tablet (6.25 mg total) by mouth 2 (two) times daily with a meal.  180 tablet  3  . digoxin (LANOXIN) 0.125 MG tablet Take 1 tablet (0.125 mg total) by mouth daily.  90 tablet  3  . furosemide (LASIX) 20 MG tablet Take two tablets daily for 3 days, then one tablet daily  35 tablet  6  . potassium chloride SA (K-DUR,KLOR-CON) 20 MEQ tablet Take 1 tablet (20 mEq total) by mouth 2 (two) times daily.  30 tablet  3  . simvastatin (ZOCOR) 20 MG tablet Take 1 tablet (20 mg total) by mouth at bedtime.  90 tablet  3  . warfarin (COUMADIN) 4 MG tablet Take 1 tablet (4 mg total) by mouth daily. OR AS DIRECTED  90 tablet  1   No current facility-administered medications for this visit.    No Known Allergies  History   Social History  . Marital Status:  Divorced    Spouse Name: N/A    Number of Children: N/A  . Years of Education: N/A   Occupational History  . retired    Social History Main Topics  . Smoking status: Former Smoker -- 1.00 packs/day for 43 years    Types: Cigarettes    Quit date: 09/12/2012  . Smokeless tobacco: Never Used     Comment: More than 43 + pack years as he smoked up to 2 1/2 ppd for many years. Had a period of cessation after femoral stent.  . Alcohol Use: 16.8 oz/week    28 Cans of beer per week     Comment: Glass of wine daily  . Drug Use: No  . Sexually Active: Not on file   Other Topics Concern  . Not on file   Social History Narrative  . No narrative on file     Review of Systems: General: negative for chills, fever, night sweats or weight changes.  Cardiovascular: negative for chest pain, dyspnea on exertion, edema, orthopnea, palpitations, paroxysmal nocturnal dyspnea or shortness of breath Dermatological: negative for rash Respiratory: negative for cough or wheezing Urologic: negative for hematuria Abdominal: negative for nausea, vomiting, diarrhea, bright red blood per rectum, melena, or hematemesis Neurologic:  negative for visual changes, syncope, or dizziness All other systems reviewed and are otherwise negative except as noted above.    Blood pressure 118/62, pulse 103, height 5\' 10"  (1.778 m), weight 183 lb 4.8 oz (83.144 kg).  General appearance: alert, cooperative and no distress Lungs: decreased breath sounds Heart: regularly irregular rhythm  EKG  normal sinus rhythm, unchanged from previous tracings.  ASSESSMENT AND PLAN:   Atrial flutter He remains if A flutter with 2:1 conduction, on Amiodarone 200mg  BID for the past week, with an overall rate of 110. He is asymptomatic.  Chronic anticoagulation INR 2.3 today  Aortic stenosis, severe- Tissue AVR 5/14 Stable after surgery  CAD, CABG X 2 with SVG-PD/PL 5/14 .  COPD (chronic obstructive pulmonary disease) with  emphysema .   PLAN  INR Q week X 4 then OP DCCV, continue Amiodarone 200 mg BID for now. OV in 4 weeks prior to DCCV.   [name] KPA-C 11/21/2012 3:59 PM

## 2012-11-21 NOTE — Assessment & Plan Note (Signed)
INR 2.3 today

## 2012-11-21 NOTE — Assessment & Plan Note (Signed)
Stable after surgery.

## 2012-11-21 NOTE — Assessment & Plan Note (Signed)
He remains if A flutter with 2:1 conduction, on Amiodarone 200mg  BID for the past week, with an overall rate of 110. He is asymptomatic.

## 2012-11-21 NOTE — Patient Instructions (Addendum)
Continue Amiodarone 200mg  BID for now. Our office will contact you about follow up.  DC Cardioversion in one month.  INR every week until you have the Cardioversion.

## 2012-11-22 ENCOUNTER — Encounter: Payer: Self-pay | Admitting: *Deleted

## 2012-11-22 LAB — BASIC METABOLIC PANEL
Glucose, Bld: 88 mg/dL (ref 70–99)
Potassium: 4.2 mEq/L (ref 3.5–5.3)
Sodium: 137 mEq/L (ref 135–145)

## 2012-11-22 LAB — DIGOXIN LEVEL: Digoxin Level: 0.9 ng/mL (ref 0.8–2.0)

## 2012-11-25 NOTE — Progress Notes (Signed)
Patient ID: [name], male   DOB: October 01, 1950, 62 y.o.   MRN: [id number]        HISTORY & PHYSICAL  DATE: 10/31/2012   FACILITY: [facility]  LEVEL OF CARE: SNF (31)  ALLERGIES:  No Known Allergies  CHIEF COMPLAINT:  Manage aortic stenosis, coronary artery disease, and CHF.    HISTORY OF PRESENT ILLNESS:  62 year-old, Caucasian male is admitted for short-term rehabilitation after hospitalization.    AORTIC STENOSIS: The patient presented to hospital with progressive, worsening shortness of breath, lower extremity swelling, and palpitations.  2D-echo showed EF of 30-35% and severe aortic stenosis.  Therefore, he underwent AVR and tolerated the procedure well.  The aortic stenosis remains stable.  Patient denies SOB, DOE, dizziness or syncopal episodes.  Patient is currently being followed by the cardiologist.    CAD: The patient also underwent CABG x2, right and left MAZE, and placement of left atrial fib and tolerated the procedures well.  The angina has been stable. The patient denies dyspnea on exertion, orthopnea, pedal edema, palpitations and paroxysmal nocturnal dyspnea. No complications noted from the medication presently being used.    CHF:The patient does not relate significant weight changes, denies sob, DOE, orthopnea, PNDs, pedal edema, palpitations or chest pain.  CHF remains stable.  No complications form the medications being used.  The patient has  chronic lower extremity swelling.    PAST MEDICAL HISTORY :  Past Medical History  Diagnosis Date  . Hypertension   . Gout   . Aortic stenosis     echo 06/19/08 with nomal LV function, moderate concentric LVH moderate aortic stenosis area 0.99 cm squared, peak gradient of 50 and mean of 31  . Atrial fibrillation   . HTN (hypertension)   . Hyperlipidemia   . Atrial fibrillation with RVR, was in atrial fib in 04/2011 09/16/2012  . Tobacco use 09/16/2012  . Heart murmur   . Acute combined systolic and  diastolic CHF, NYHA class 3 -- previous normal LV function and moderate AS- re-evaluate; EF now 30-35% with regional [facility] 09/16/2012  . Aortic stenosis, severe 09/23/2012  . CAD (coronary artery disease), single vessel disease 09/23/2012  . Cellulitis, lower extremity- treated 09/23/2012  . PAD (peripheral artery disease), decreased bil. ABIs 09/23/2012  . Gout flare. 09/29/12, Lt knee, improved with colchicine. 09/30/2012  . H/O aortic valve replacement   . Congestive heart failure     PAST SURGICAL HISTORY: Past Surgical History  Procedure Laterality Date  . Iliac artery stent  10/20/08    stent to lt iliac  . Aorto bifem bypass  12/18/08    by Dr. Myra Gianotti  . Coronary artery bypass graft N/A 10/08/2012    Procedure: CORONARY ARTERY BYPASS GRAFTING (CABG);  Surgeon: Delight Ovens, MD;  Location: [facility] OR;  Service: Open Heart Surgery;  Laterality: N/A;  Coronary Artery bypass Graft times two utilizing the left greater saphenous vein harvested endoscopically  . Aortic valve replacement N/A 10/08/2012    Procedure: AORTIC VALVE REPLACEMENT (AVR);  Surgeon: Delight Ovens, MD;  Location: [facility] OR;  Service: Open Heart Surgery;  Laterality: N/A;  . Maze N/A 10/08/2012    Procedure: MAZE;  Surgeon: Delight Ovens, MD;  Location: [facility] OR;  Service: Open Heart Surgery;  Laterality: N/A;  . Intraoperative transesophageal echocardiogram N/A 10/08/2012    Procedure: INTRAOPERATIVE TRANSESOPHAGEAL ECHOCARDIOGRAM;  Surgeon: Delight Ovens, MD;  Location: [facility] OR;  Service: Open Heart Surgery;  Laterality:  N/A;  . Endovein harvest of greater saphenous vein Bilateral 10/08/2012    Procedure: ENDOVEIN HARVEST OF GREATER SAPHENOUS VEIN;  Surgeon: Delight Ovens, MD;  Location: [facility] OR;  Service: Open Heart Surgery;  Laterality: Bilateral;    SOCIAL HISTORY:  reports that he quit smoking about 2 months ago. His smoking use included Cigarettes. He has a 43 pack-year smoking history. He has never used smokeless tobacco.  He reports that he drinks about 16.8 ounces of alcohol per week. He reports that he does not use illicit drugs.  FAMILY HISTORY:  Family History  Problem Relation Age of Onset  . Heart attack Father     died at 81 with MI  . Heart disease Brother     CURRENT MEDICATIONS: Reviewed per [facility]  REVIEW OF SYSTEMS:  See HPI otherwise 14 point ROS is negative.  PHYSICAL EXAMINATION  VS:  T 98.2       P 73      RR 22      BP 139/81      POX 98% room air      WT (Lb)  GENERAL: no acute distress, normal body habitus EYES: conjunctivae normal, sclerae normal, normal eye lids MOUTH/THROAT: lips without lesions,no lesions in the mouth,tongue is without lesions,uvula elevates in midline NECK: supple, trachea midline, no neck masses, no thyroid tenderness, no thyromegaly LYMPHATICS: no LAN in the neck, no supraclavicular LAN RESPIRATORY: breathing is even & unlabored, BS CTAB CARDIAC: RRR, no murmur,no extra heart sounds EDEMA/VARICOSITIES:  +2 bilateral lower extremity edema  ARTERIAL:  pedal pulses nonpalpable  GI:  ABDOMEN: abdomen soft, normal BS, no masses, no tenderness  LIVER/SPLEEN: no hepatomegaly, no splenomegaly MUSCULOSKELETAL: HEAD: normal to inspection & palpation BACK: no kyphosis, scoliosis or spinal processes tenderness EXTREMITIES: LEFT UPPER EXTREMITY: full range of motion, normal strength & tone RIGHT UPPER EXTREMITY:  full range of motion, normal strength & tone LEFT LOWER EXTREMITY:  full range of motion, normal strength & tone RIGHT LOWER EXTREMITY:  full range of motion, normal strength & tone PSYCHIATRIC: the patient is alert & oriented to person, affect & behavior appropriate  LABS/RADIOLOGY: Carotid ultrasound did not show significant internal carotid artery stenosis bilaterally.    Hemoglobin 9.9, MCV 90.4, otherwise CBC normal.    Creatinine 1.52, otherwise BMP normal.    10/24/2012:  Hemoglobin 9.2, MCV 86.6, otherwise CBC normal.    BMP normal.     Digoxin level 1.1.    Fasting lipid panel normal.    ASSESSMENT/PLAN:  Severe aortic stenosis.  Status post AVR.  Continue rehabilitation.   CAD.  Status post CABG.   CHF.  Adequately compensated.   Hypertension.  Well controlled.    Atrial fibrillation.  Rate controlled.    Hypokalemia.  Continue potassium supplementation.  Reassess.   Anemia of chronic disease.  Unstable.  Hemoglobin declined.  Recheck in one week pending.   Hyperlipidemia.  Well controlled.    I have reviewed patient's medical records received at admission/from hospitalization.  CPT CODE: 21308

## 2012-11-29 ENCOUNTER — Telehealth: Payer: Self-pay | Admitting: Cardiovascular Disease

## 2012-11-29 NOTE — Telephone Encounter (Signed)
Spoke with pt.  He has a refill of his Coumadin; he just looked at the wrong bottle.

## 2012-11-29 NOTE — Telephone Encounter (Signed)
Message forwarded to S. Putt, RPH.

## 2012-11-29 NOTE — Telephone Encounter (Signed)
Pt is calling because he needs Rx refill for Coumadin. He stated that it needs a PA.

## 2012-12-05 ENCOUNTER — Other Ambulatory Visit: Payer: Self-pay | Admitting: *Deleted

## 2012-12-05 ENCOUNTER — Encounter (HOSPITAL_COMMUNITY)
Admission: RE | Admit: 2012-12-05 | Discharge: 2012-12-05 | Disposition: A | Discharge status: Home / Self Care | Payer: Medicaid Other | Source: Ambulatory Visit | Attending: Cardiovascular Disease | Admitting: Cardiovascular Disease

## 2012-12-05 DIAGNOSIS — I10 Essential (primary) hypertension: Secondary | ICD-10-CM | POA: Insufficient documentation

## 2012-12-05 DIAGNOSIS — I2510 Atherosclerotic heart disease of native coronary artery without angina pectoris: Secondary | ICD-10-CM | POA: Insufficient documentation

## 2012-12-05 DIAGNOSIS — J4489 Other specified chronic obstructive pulmonary disease: Secondary | ICD-10-CM | POA: Insufficient documentation

## 2012-12-05 DIAGNOSIS — I359 Nonrheumatic aortic valve disorder, unspecified: Secondary | ICD-10-CM | POA: Insufficient documentation

## 2012-12-05 DIAGNOSIS — I2589 Other forms of chronic ischemic heart disease: Secondary | ICD-10-CM | POA: Insufficient documentation

## 2012-12-05 DIAGNOSIS — I509 Heart failure, unspecified: Secondary | ICD-10-CM | POA: Insufficient documentation

## 2012-12-05 DIAGNOSIS — I079 Rheumatic tricuspid valve disease, unspecified: Secondary | ICD-10-CM | POA: Insufficient documentation

## 2012-12-05 DIAGNOSIS — Z5189 Encounter for other specified aftercare: Secondary | ICD-10-CM | POA: Insufficient documentation

## 2012-12-05 DIAGNOSIS — I4891 Unspecified atrial fibrillation: Secondary | ICD-10-CM | POA: Insufficient documentation

## 2012-12-05 DIAGNOSIS — I739 Peripheral vascular disease, unspecified: Secondary | ICD-10-CM | POA: Insufficient documentation

## 2012-12-05 DIAGNOSIS — J449 Chronic obstructive pulmonary disease, unspecified: Secondary | ICD-10-CM | POA: Insufficient documentation

## 2012-12-05 MED ORDER — POTASSIUM CHLORIDE CRYS ER 20 MEQ PO TBCR: 20.0000 meq | EXTENDED_RELEASE_TABLET | Freq: Two times a day (BID) | ORAL | Status: DC

## 2012-12-05 NOTE — Progress Notes (Signed)
Cardiac Rehab Medication Review by a Pharmacist  Does the patient  feel that his/her medications are working for him/her?  yes  Has the patient been experiencing any side effects to the medications prescribed?  yes  Does the patient measure his/her own blood pressure or blood glucose at home?  no   Does the patient have any problems obtaining medications due to transportation or finances?   no  Understanding of regimen: good Understanding of indications: good Potential of compliance: excellent    Pharmacist comments:  Very pleasant 62 yo M who presents with complete list of medications. Patient was very knowledgeable about indications for use of his medications, but reinforced these indications and importance of continued use. Patient did state that he rarely feels light-headed or dizzy and upon further probing we found that this happens upon standing so I reinforced standing up slowly and testing BP if it is sustained. Patient uses a pillbox to manage his medications and states that he very seldomly misses any doses.  Overall, very well-informed, compliant gentleman with no concerns about his medications.    Margie Billet, PharmD Clinical Pharmacist - Resident Pager: [phone] Pharmacy: [phone] 12/05/2012 8:49 AM

## 2012-12-05 NOTE — Telephone Encounter (Signed)
Rx was sent to pharmacy electronically. 

## 2012-12-09 ENCOUNTER — Encounter (HOSPITAL_COMMUNITY)
Admission: RE | Admit: 2012-12-09 | Discharge: 2012-12-09 | Disposition: A | Discharge status: Home / Self Care | Payer: Medicaid Other | Source: Ambulatory Visit | Attending: Cardiovascular Disease | Admitting: Cardiovascular Disease

## 2012-12-09 ENCOUNTER — Encounter (HOSPITAL_COMMUNITY): Payer: Self-pay

## 2012-12-09 ENCOUNTER — Encounter: Payer: Self-pay | Admitting: Cardiovascular Disease

## 2012-12-09 NOTE — Progress Notes (Addendum)
Pt started cardiac rehab today.  Pt tolerated light exercise without difficulty.  VSS, telemetry-atrial flutter, occasional multifocal PVC.  Asymptomatic. Pt oriented to exercise equipment and routine.  Understanding verbalized.  Pt psychosocial assessment reveals no barriers to rehab participation.  Pt quality of life is slightly altered by his physical constraints which limits his ability to perform tasks as prior to his illness.  Pt exhibits positive coping skills and has supportive family.  Offered emotional support and reassurance.  Will continue to monitor.

## 2012-12-10 ENCOUNTER — Other Ambulatory Visit: Payer: Self-pay | Admitting: *Deleted

## 2012-12-10 DIAGNOSIS — Z79899 Other long term (current) drug therapy: Secondary | ICD-10-CM

## 2012-12-10 DIAGNOSIS — D689 Coagulation defect, unspecified: Secondary | ICD-10-CM

## 2012-12-10 DIAGNOSIS — R5381 Other malaise: Secondary | ICD-10-CM

## 2012-12-10 DIAGNOSIS — Z01818 Encounter for other preprocedural examination: Secondary | ICD-10-CM

## 2012-12-11 ENCOUNTER — Encounter (HOSPITAL_COMMUNITY): Payer: Medicaid Other

## 2012-12-12 ENCOUNTER — Ambulatory Visit (INDEPENDENT_AMBULATORY_CARE_PROVIDER_SITE_OTHER): Payer: Medicaid Other | Admitting: Pharmacist Clinician (PhC)/ Clinical Pharmacy Specialist

## 2012-12-12 DIAGNOSIS — Z7901 Long term (current) use of anticoagulants: Secondary | ICD-10-CM

## 2012-12-12 DIAGNOSIS — I4891 Unspecified atrial fibrillation: Secondary | ICD-10-CM

## 2012-12-12 LAB — POCT INR: INR: 1.6

## 2012-12-13 ENCOUNTER — Encounter (HOSPITAL_COMMUNITY): Payer: Medicaid Other

## 2012-12-13 LAB — BASIC METABOLIC PANEL
BUN: 26 mg/dL — ABNORMAL HIGH (ref 6–23)
Calcium: 9.4 mg/dL (ref 8.4–10.5)
Glucose, Bld: 88 mg/dL (ref 70–99)

## 2012-12-13 LAB — CBC
Platelets: 237 10*3/uL (ref 150–400)
RDW: 17.3 % — ABNORMAL HIGH (ref 11.5–15.5)
WBC: 6.1 10*3/uL (ref 4.0–10.5)

## 2012-12-13 LAB — APTT: aPTT: 37 seconds (ref 24–37)

## 2012-12-13 LAB — PROTIME-INR
INR: 1.49 (ref <1.50)
Prothrombin Time: 17.7 seconds — ABNORMAL HIGH (ref 11.6–15.2)

## 2012-12-16 ENCOUNTER — Telehealth: Payer: Self-pay | Admitting: Cardiovascular Disease

## 2012-12-16 ENCOUNTER — Encounter (HOSPITAL_COMMUNITY): Payer: Medicaid Other

## 2012-12-16 NOTE — Telephone Encounter (Signed)
Returning your call from Friday. °

## 2012-12-16 NOTE — Telephone Encounter (Signed)
Pt called again-returning your call

## 2012-12-17 ENCOUNTER — Ambulatory Visit (INDEPENDENT_AMBULATORY_CARE_PROVIDER_SITE_OTHER): Payer: Self-pay | Admitting: Pharmacist Clinician (PhC)/ Clinical Pharmacy Specialist

## 2012-12-17 ENCOUNTER — Telehealth: Payer: Self-pay | Admitting: Pharmacist Clinician (PhC)/ Clinical Pharmacy Specialist

## 2012-12-17 DIAGNOSIS — Z7901 Long term (current) use of anticoagulants: Secondary | ICD-10-CM

## 2012-12-17 DIAGNOSIS — I4891 Unspecified atrial fibrillation: Secondary | ICD-10-CM

## 2012-12-17 NOTE — Telephone Encounter (Signed)
I spoke with patient.  His INR has dropped and the DCCV has to be cancelled.  Pt aware and plans were made for weekly INR.  Will reschedule DCCV when 3 INRs are >2

## 2012-12-17 NOTE — Telephone Encounter (Signed)
Message copied by Rosalee Kaufman on Tue Dec 17, 2012 11:57 AM ------      Message from: Runell Gess      Created: Mon Dec 16, 2012  1:20 PM       Labs OK except INR low.Pt on Coumadin ------

## 2012-12-18 ENCOUNTER — Encounter (HOSPITAL_COMMUNITY): Payer: Medicaid Other

## 2012-12-19 ENCOUNTER — Encounter (HOSPITAL_COMMUNITY): Admission: RE | Payer: Self-pay | Source: Ambulatory Visit

## 2012-12-19 ENCOUNTER — Ambulatory Visit (HOSPITAL_COMMUNITY): Admission: RE | Admit: 2012-12-19 | Payer: Self-pay | Source: Ambulatory Visit | Admitting: Cardiology

## 2012-12-19 SURGERY — CARDIOVERSION
Anesthesia: Monitor Anesthesia Care

## 2012-12-20 ENCOUNTER — Encounter (HOSPITAL_COMMUNITY): Payer: Medicaid Other

## 2012-12-24 ENCOUNTER — Ambulatory Visit (INDEPENDENT_AMBULATORY_CARE_PROVIDER_SITE_OTHER): Payer: Medicaid Other | Admitting: Pharmacist Clinician (PhC)/ Clinical Pharmacy Specialist

## 2012-12-24 VITALS — BP 110/64 | HR 72

## 2012-12-24 DIAGNOSIS — I4891 Unspecified atrial fibrillation: Secondary | ICD-10-CM

## 2012-12-24 DIAGNOSIS — Z7901 Long term (current) use of anticoagulants: Secondary | ICD-10-CM

## 2012-12-24 LAB — POCT INR: INR: 3.4

## 2012-12-25 ENCOUNTER — Encounter (HOSPITAL_COMMUNITY): Admission: RE | Admit: 2012-12-25 | Payer: Medicaid Other | Source: Ambulatory Visit

## 2012-12-26 ENCOUNTER — Encounter: Payer: Self-pay | Admitting: Pulmonary Disease

## 2012-12-26 ENCOUNTER — Ambulatory Visit (INDEPENDENT_AMBULATORY_CARE_PROVIDER_SITE_OTHER): Payer: Medicaid Other | Admitting: Pulmonary Disease

## 2012-12-26 VITALS — BP 138/72 | HR 108 | Temp 98.9°F | Ht 69.0 in | Wt 195.0 lb

## 2012-12-26 DIAGNOSIS — J439 Emphysema, unspecified: Secondary | ICD-10-CM

## 2012-12-26 DIAGNOSIS — J438 Other emphysema: Secondary | ICD-10-CM

## 2012-12-26 DIAGNOSIS — J9600 Acute respiratory failure, unspecified whether with hypoxia or hypercapnia: Secondary | ICD-10-CM

## 2012-12-26 LAB — PULMONARY FUNCTION TEST

## 2012-12-26 NOTE — Progress Notes (Signed)
  Subjective:    Patient ID: [name], male    DOB: [birth year], 62 y.o.   MRN: [id number]  HPI The patient comes in today for followup after his recent pulmonary function studies.  He was found to have severe airflow obstruction, as well is a severe decrease in diffusion capacity.  I have reviewed this study with him in detail, and answered all of his questions.  He tells me he did not respond to Anoro.  He is still participating in pulmonary rehab.    Review of Systems  Constitutional: Negative for fever and unexpected weight change.  HENT: Negative for ear pain, nosebleeds, congestion, sore throat, rhinorrhea, sneezing, trouble swallowing, dental problem, postnasal drip and sinus pressure.   Eyes: Negative for redness and itching.  Respiratory: Negative for cough, chest tightness, shortness of breath and wheezing.   Cardiovascular: Negative for palpitations and leg swelling.  Gastrointestinal: Negative for nausea and vomiting.  Genitourinary: Negative for dysuria.  Musculoskeletal: Negative for joint swelling.  Skin: Negative for rash.  Neurological: Negative for headaches.  Hematological: Does not bruise/bleed easily.  Psychiatric/Behavioral: Negative for dysphoric mood. The patient is not nervous/anxious.        Objective:   Physical Exam Well-developed male in no acute distress Nose without purulence or discharge noted Neck without lymphadenopathy or thyromegaly Chest with decreased breath sounds, no wheezing Lower extremities with minimal edema, no cyanosis Alert and oriented, moves all 4 extremities       Assessment & Plan:

## 2012-12-26 NOTE — Assessment & Plan Note (Signed)
The patient has severe airflow obstruction secondary to emphysema on his PFTs today.  I have reviewed these in detail with him, and explained that his functional status appears to be better than his degree of lung disease by pulmonary function studies.  Unfortunately, he did not respond to anoro, and I explained the goal will be to keep him as functional as possible and prevent acute exacerbations.  I will therefore try him on Breo with as needed albuterol.  The patient is not requiring oxygen currently, nor has he had significant desaturation in cardiac rehabilitation by his history.  I have asked him to continue with rehabilitation, and consider a maintenance program either in cardiac pulmonary rehabilitation.

## 2012-12-26 NOTE — Telephone Encounter (Signed)
Pt to be scheduled for weekly INR for upcoming DCCV.

## 2012-12-26 NOTE — Progress Notes (Signed)
PFT done today. 

## 2012-12-26 NOTE — Patient Instructions (Addendum)
Will try you on breo one inhalation each am.  Rinse mouth well.  This is different than Anoro Can use albuterol inhaler 2 puffs up to every 6 hrs if needed for rescue/emergencies (take to rehab with you) Continue in cardiac rehab, and can assess if you would benefit from maintenance or pulmonary rehab at the end of your sessions. Work on weight loss followup with me in 4 mos to check on things.

## 2012-12-27 ENCOUNTER — Encounter (HOSPITAL_COMMUNITY): Payer: Medicaid Other

## 2012-12-30 ENCOUNTER — Encounter (HOSPITAL_COMMUNITY): Payer: Medicaid Other

## 2012-12-31 ENCOUNTER — Ambulatory Visit (INDEPENDENT_AMBULATORY_CARE_PROVIDER_SITE_OTHER): Payer: Medicaid Other | Admitting: Pharmacist Clinician (PhC)/ Clinical Pharmacy Specialist

## 2012-12-31 VITALS — BP 110/80 | HR 60

## 2012-12-31 DIAGNOSIS — I4891 Unspecified atrial fibrillation: Secondary | ICD-10-CM

## 2012-12-31 DIAGNOSIS — Z7901 Long term (current) use of anticoagulants: Secondary | ICD-10-CM

## 2012-12-31 NOTE — Progress Notes (Signed)
Needs INR value entered please

## 2013-01-01 ENCOUNTER — Encounter (HOSPITAL_COMMUNITY): Payer: Medicaid Other

## 2013-01-02 ENCOUNTER — Ambulatory Visit: Payer: Self-pay | Admitting: Pharmacist Clinician (PhC)/ Clinical Pharmacy Specialist

## 2013-01-03 ENCOUNTER — Encounter (HOSPITAL_COMMUNITY): Payer: Medicaid Other

## 2013-01-06 ENCOUNTER — Encounter (HOSPITAL_COMMUNITY): Payer: Medicaid Other

## 2013-01-07 ENCOUNTER — Ambulatory Visit (INDEPENDENT_AMBULATORY_CARE_PROVIDER_SITE_OTHER): Payer: Medicaid Other | Admitting: Pharmacist Clinician (PhC)/ Clinical Pharmacy Specialist

## 2013-01-07 VITALS — BP 130/66 | HR 84

## 2013-01-07 DIAGNOSIS — Z7901 Long term (current) use of anticoagulants: Secondary | ICD-10-CM

## 2013-01-07 DIAGNOSIS — I4891 Unspecified atrial fibrillation: Secondary | ICD-10-CM

## 2013-01-07 LAB — POCT INR: INR: 2.4

## 2013-01-08 ENCOUNTER — Encounter (HOSPITAL_COMMUNITY): Payer: Medicaid Other

## 2013-01-10 ENCOUNTER — Other Ambulatory Visit: Payer: Self-pay | Admitting: *Deleted

## 2013-01-10 ENCOUNTER — Encounter: Payer: Self-pay | Admitting: Cardiovascular Disease

## 2013-01-10 ENCOUNTER — Encounter (HOSPITAL_COMMUNITY): Payer: Medicaid Other

## 2013-01-10 DIAGNOSIS — I4891 Unspecified atrial fibrillation: Secondary | ICD-10-CM

## 2013-01-10 DIAGNOSIS — Z79899 Other long term (current) drug therapy: Secondary | ICD-10-CM

## 2013-01-10 DIAGNOSIS — Z7901 Long term (current) use of anticoagulants: Secondary | ICD-10-CM

## 2013-01-13 ENCOUNTER — Encounter (HOSPITAL_COMMUNITY): Payer: Medicaid Other

## 2013-01-13 ENCOUNTER — Telehealth: Payer: Self-pay | Admitting: Cardiovascular Disease

## 2013-01-13 NOTE — Telephone Encounter (Signed)
Returning your call. °

## 2013-01-13 NOTE — Telephone Encounter (Signed)
I spoke with patient and reviewed appt needed for upcoming DCCV. Pt agreeable with ov tomorrow with LI and cx INR appt due to pre proc labs being drawn.

## 2013-01-14 ENCOUNTER — Ambulatory Visit (INDEPENDENT_AMBULATORY_CARE_PROVIDER_SITE_OTHER): Payer: Medicaid Other | Admitting: Cardiology

## 2013-01-14 ENCOUNTER — Encounter: Payer: Self-pay | Admitting: Cardiology

## 2013-01-14 ENCOUNTER — Encounter: Payer: Self-pay | Admitting: *Deleted

## 2013-01-14 ENCOUNTER — Ambulatory Visit: Payer: Self-pay | Admitting: Pharmacist Clinician (PhC)/ Clinical Pharmacy Specialist

## 2013-01-14 VITALS — BP 152/96 | HR 108 | Ht 70.0 in | Wt 201.3 lb

## 2013-01-14 DIAGNOSIS — R600 Localized edema: Secondary | ICD-10-CM

## 2013-01-14 DIAGNOSIS — I4891 Unspecified atrial fibrillation: Secondary | ICD-10-CM

## 2013-01-14 DIAGNOSIS — Z7901 Long term (current) use of anticoagulants: Secondary | ICD-10-CM

## 2013-01-14 DIAGNOSIS — I4892 Unspecified atrial flutter: Secondary | ICD-10-CM

## 2013-01-14 DIAGNOSIS — I350 Nonrheumatic aortic (valve) stenosis: Secondary | ICD-10-CM

## 2013-01-14 DIAGNOSIS — I2589 Other forms of chronic ischemic heart disease: Secondary | ICD-10-CM

## 2013-01-14 DIAGNOSIS — R609 Edema, unspecified: Secondary | ICD-10-CM

## 2013-01-14 DIAGNOSIS — I255 Ischemic cardiomyopathy: Secondary | ICD-10-CM

## 2013-01-14 DIAGNOSIS — I10 Essential (primary) hypertension: Secondary | ICD-10-CM

## 2013-01-14 DIAGNOSIS — I359 Nonrheumatic aortic valve disorder, unspecified: Secondary | ICD-10-CM

## 2013-01-14 DIAGNOSIS — I2510 Atherosclerotic heart disease of native coronary artery without angina pectoris: Secondary | ICD-10-CM

## 2013-01-14 LAB — CBC
HCT: 37.7 % — ABNORMAL LOW (ref 39.0–52.0)
Hemoglobin: 12.2 g/dL — ABNORMAL LOW (ref 13.0–17.0)
MCH: 28.4 pg (ref 26.0–34.0)
MCHC: 32.4 g/dL (ref 30.0–36.0)
MCV: 87.7 fL (ref 78.0–100.0)
Platelets: 221 10*3/uL (ref 150–400)
RBC: 4.3 MIL/uL (ref 4.22–5.81)
RDW: 18.3 % — ABNORMAL HIGH (ref 11.5–15.5)
WBC: 6.4 10*3/uL (ref 4.0–10.5)

## 2013-01-14 LAB — PROTIME-INR: INR: 1.82 — ABNORMAL HIGH (ref <1.50)

## 2013-01-14 LAB — BASIC METABOLIC PANEL
Calcium: 9 mg/dL (ref 8.4–10.5)
Potassium: 4.6 mEq/L (ref 3.5–5.3)
Sodium: 134 mEq/L — ABNORMAL LOW (ref 135–145)

## 2013-01-14 NOTE — Assessment & Plan Note (Signed)
No chest pain

## 2013-01-14 NOTE — Patient Instructions (Signed)
Increase your lasix to 2 tablets for 3 days then back to one daily.  Plan for Cardioversion with Dr. Allyson Sabal.  Call if edema continues despite increase of lasix.

## 2013-01-14 NOTE — Assessment & Plan Note (Signed)
Therapeutic INRs plan for DCCV.

## 2013-01-14 NOTE — Assessment & Plan Note (Addendum)
A. Flutter continues though rate controlled.  No acute changes.  Plan for cardioversion next week.

## 2013-01-14 NOTE — Assessment & Plan Note (Signed)
Now improved on July echo though grade 2 diastolic dysfunction

## 2013-01-14 NOTE — Assessment & Plan Note (Signed)
Perhaps BP elevated today due to volume overload, and he has not had lasix today.  Will increase Lasix for 3 days then back to usual dose.   Will monitor BP again at DCCV.

## 2013-01-14 NOTE — Progress Notes (Signed)
01/14/2013   PCP: No PCP Per Patient   Chief Complaint  Patient presents with  . Follow-up    Pre-DCCV work-up.  No chest pain, no dizziness.  +DOE, +edema bilateral    Primary Cardiologist:Dr. Erlene Quan   HPI:  62 y/o we saw in May 2014 with respiratory failure. Work up revealed CAD (RCA) and severe AS. He had a tissue AVR placed as well as CABG X 2 to the PL and PDA. He had significant problems with PAF and COPD. He had a prolonged hospitalization. He had a Maze at the time of his heart surgery. He was discharge to [facility] in [place] on Coumadin and Amiodarone. He was seen in the office 11/13/12 as a referral from cardiac rehab for tachycardia. He was asymptomatic. In the office an EKG confirmed atrial flutter. We increased his Amiodarone to 200 mg BID.  He remains asymptomatic in atrial flutter with 2:1 conduction and a VR of 108.  Plans were per Dr Allyson Sabal. The plan is for 4 weeks of documented therapeutic INR and then an OP cardioversion. For now we'll keep him on BID Amiodarone.   He was unable to maintain therapeutic INR until the last 4 weeks.  Now here today for H&P for DCCV.  Today he has no complaints, no change in his chronic shortness of breath.  No chest pain.  + Lower ext edema and his wt is up 6 pounds.    No Known Allergies  Current Outpatient Prescriptions  Medication Sig Dispense Refill  . amiodarone (PACERONE) 200 MG tablet Take 200 mg by mouth 2 (two) times daily.       . carvedilol (COREG) 6.25 MG tablet Take 1 tablet (6.25 mg total) by mouth 2 (two) times daily with a meal.  180 tablet  3  . digoxin (LANOXIN) 0.125 MG tablet Take 1 tablet (0.125 mg total) by mouth daily.  90 tablet  3  . furosemide (LASIX) 20 MG tablet Take two tablets daily for 3 days, then one tablet daily  35 tablet  6  . potassium chloride SA (K-DUR,KLOR-CON) 20 MEQ tablet Take 1 tablet (20 mEq total) by mouth 2 (two) times daily.  60 tablet  5  . simvastatin (ZOCOR) 20 MG tablet Take 1  tablet (20 mg total) by mouth at bedtime.  90 tablet  3  . warfarin (COUMADIN) 4 MG tablet Take 1 tablet (4 mg total) by mouth daily. OR AS DIRECTED  90 tablet  1  . oxyCODONE (OXY IR/ROXICODONE) 5 MG immediate release tablet Take 5 mg by mouth every 3 (three) hours as needed for pain.       No current facility-administered medications for this visit.    Past Medical History  Diagnosis Date  . Hypertension   . Gout   . Aortic stenosis     echo 06/19/08 with nomal LV function, moderate concentric LVH moderate aortic stenosis area 0.99 cm squared, peak gradient of 50 and mean of 31  . Atrial fibrillation   . Hyperlipidemia   . Atrial fibrillation with RVR, was in atrial fib in 04/2011 09/16/2012  . Tobacco use 09/16/2012  . Heart murmur   . Acute combined systolic and diastolic CHF, NYHA class 3 -- previous normal LV function and moderate AS- re-evaluate; EF now 30-35% with regional [facility] 09/16/2012  . Aortic stenosis, severe 09/23/2012  . CAD (coronary artery disease), single vessel disease 09/23/2012  . Cellulitis, lower extremity- treated 09/23/2012  .  PAD (peripheral artery disease), decreased bil. ABIs 09/23/2012  . Gout flare. 09/29/12, Lt knee, improved with colchicine. 09/30/2012  . H/O aortic valve replacement   . Congestive heart failure     Past Surgical History  Procedure Laterality Date  . Iliac artery stent  10/20/08    stent to lt iliac  . Aorto bifem bypass  12/18/08    by Dr. Myra Gianotti  . Coronary artery bypass graft N/A 10/08/2012    Procedure: CORONARY ARTERY BYPASS GRAFTING (CABG);  Surgeon: Delight Ovens, MD;  Location: [facility] OR;  Service: Open Heart Surgery;  Laterality: N/A;  Coronary Artery bypass Graft times two utilizing the left greater saphenous vein harvested endoscopically  . Aortic valve replacement N/A 10/08/2012    Procedure: AORTIC VALVE REPLACEMENT (AVR);  Surgeon: Delight Ovens, MD;  Location: [facility] OR;  Service: Open Heart Surgery;  Laterality: N/A;  . Maze N/A  10/08/2012    Procedure: MAZE;  Surgeon: Delight Ovens, MD;  Location: [facility] OR;  Service: Open Heart Surgery;  Laterality: N/A;  . Intraoperative transesophageal echocardiogram N/A 10/08/2012    Procedure: INTRAOPERATIVE TRANSESOPHAGEAL ECHOCARDIOGRAM;  Surgeon: Delight Ovens, MD;  Location: [facility] OR;  Service: Open Heart Surgery;  Laterality: N/A;  . Endovein harvest of greater saphenous vein Bilateral 10/08/2012    Procedure: ENDOVEIN HARVEST OF GREATER SAPHENOUS VEIN;  Surgeon: Delight Ovens, MD;  Location: [facility] OR;  Service: Open Heart Surgery;  Laterality: Bilateral;  . US echocardiography  06/20/2011    mod concentric LVH,LA severely dilated,RA mildly dilated,mod. ca+ of the mitral apparatus,trace MR,mod. ca+ AOV w/stenosis.  Marland Kitchen Nm myocar perf wall motion  08/25/2008    normal    ZOX:WRUEAVW:UJ colds or fevers, no weight changes Skin:no rashes or ulcers HEENT:no blurred vision, no congestion CV:see HPI PUL:see HPI GI:no diarrhea constipation or melena, no indigestion, no nausea GU:no hematuria, no dysuria MS:no joint pain, no claudication Neuro:no syncope, no lightheadedness Endo:no diabetes, no thyroid disease  PHYSICAL EXAM BP 152/96  Pulse 108  Ht 5\' 10"  (1.778 m)  Wt 201 lb 4.8 oz (91.309 kg)  BMI 28.88 kg/m2 General:Pleasant affect, NAD Skin:Warm and dry, brisk capillary refill HEENT:normocephalic, sclera clear, mucus membranes moist Neck:supple, no JVD, no bruits  Heart:reg irreg with soft systolic murmur, no gallup, rub or click Lungs:clear without rales, rhonchi, or wheezes WJX:BJYN, non tender, + BS, do not palpate liver spleen or masses Ext:no lower ext edema, 2+ pedal pulses, 2+ radial pulses Neuro:alert and oriented, MAE, follows commands, + facial symmetry  EKG:A. Flutter with RVR at 108 no acute changes otherwise.  ASSESSMENT AND PLAN Atrial flutter A. Flutter continues though rate controlled.  No acute changes.  Plan for cardioversion next week.      Bilateral lower extremity edema, secondary to chronic CHF BP is up, wt is up 6 pounds from last visit.  Will increase lasix to 40 mg X 3 days then back to 20 mg daily.  Chronic anticoagulation Therapeutic INRs plan for DCCV.  HTN (hypertension) Perhaps BP elevated today due to volume overload, and he has not had lasix today.  Will increase Lasix for 3 days then back to usual dose.   Will monitor BP again at DCCV.  Aortic stenosis, severe- Tissue AVR 5/14 stable  CAD, CABG X 2 with SVG-PD/PL 5/14 No chest pain  Cardiomyopathy, ischemic- 30-35% 2D  Now improved on July echo though grade 2 diastolic dysfunction

## 2013-01-14 NOTE — Assessment & Plan Note (Signed)
BP is up, wt is up 6 pounds from last visit.  Will increase lasix to 40 mg X 3 days then back to 20 mg daily.

## 2013-01-14 NOTE — Assessment & Plan Note (Signed)
stable °

## 2013-01-15 ENCOUNTER — Encounter (HOSPITAL_COMMUNITY): Payer: Medicaid Other

## 2013-01-16 ENCOUNTER — Encounter: Payer: Self-pay | Admitting: Pulmonary Disease

## 2013-01-17 ENCOUNTER — Encounter (HOSPITAL_COMMUNITY): Payer: Medicaid Other

## 2013-01-20 ENCOUNTER — Encounter: Payer: Self-pay | Admitting: *Deleted

## 2013-01-20 ENCOUNTER — Telehealth: Payer: Self-pay | Admitting: Pharmacist Clinician (PhC)/ Clinical Pharmacy Specialist

## 2013-01-20 ENCOUNTER — Encounter (HOSPITAL_COMMUNITY): Payer: Medicaid Other

## 2013-01-20 MED ORDER — APIXABAN 5 MG PO TABS: 5.0000 mg | ORAL_TABLET | Freq: Two times a day (BID) | ORAL | Status: DC

## 2013-01-20 NOTE — Telephone Encounter (Signed)
Will switch pt to Eliquis 5mg  bid until after cardioversion.  Gave samples and rx for 30 day trial.

## 2013-01-20 NOTE — Telephone Encounter (Signed)
Message copied by Rosalee Kaufman on Mon Jan 20, 2013  3:35 PM ------      Message from: Runell Gess      Created: Mon Jan 20, 2013  3:11 PM       [name] has been unable to have 4 consecutive therapeutic INRs on Coumadin for outpatient cardioversion. Let's switch him to a novel oral anticoagulant and I can cardiovert him after 4 weeks and then will restart Coumadin 3 months after cardioversion. ------

## 2013-01-21 ENCOUNTER — Ambulatory Visit (HOSPITAL_COMMUNITY): Admission: RE | Admit: 2013-01-21 | Payer: Self-pay | Source: Ambulatory Visit | Admitting: Cardiovascular Disease

## 2013-01-21 ENCOUNTER — Encounter (HOSPITAL_COMMUNITY): Payer: Self-pay | Admitting: Certified Registered"

## 2013-01-21 ENCOUNTER — Encounter (HOSPITAL_COMMUNITY): Admission: RE | Payer: Self-pay | Source: Ambulatory Visit

## 2013-01-21 SURGERY — CARDIOVERSION
Anesthesia: Monitor Anesthesia Care

## 2013-01-22 ENCOUNTER — Encounter (HOSPITAL_COMMUNITY): Admission: RE | Admit: 2013-01-22 | Payer: Medicaid Other | Source: Ambulatory Visit

## 2013-01-24 ENCOUNTER — Encounter: Payer: Self-pay | Admitting: Cardiovascular Disease

## 2013-01-24 ENCOUNTER — Encounter (HOSPITAL_COMMUNITY): Payer: Medicaid Other

## 2013-01-24 ENCOUNTER — Telehealth: Payer: Self-pay | Admitting: Cardiovascular Disease

## 2013-01-24 ENCOUNTER — Telehealth (HOSPITAL_COMMUNITY): Payer: Self-pay | Admitting: Cardiac Rehabilitation

## 2013-01-24 NOTE — Telephone Encounter (Signed)
NPO after 6:30 am, clear liquids prior to that.  Pt notified

## 2013-01-24 NOTE — Telephone Encounter (Signed)
Message copied by Robyne Peers on Fri Jan 24, 2013 11:33 AM ------      Message from: Cammy Copa      Created: Thu Jan 23, 2013 12:02 PM      Regarding: cardiac rehab                   ----- Message -----         From: Marella Bile, RN         Sent: 01/22/2013   5:51 PM           To: Thayer Headings, RN            I checked with Dr Allyson Sabal today after I talked with someone from cardiac rehab.  Dr Allyson Sabal wants to wait another month before we cardiovert Mr Lantzy.  He can return to rehab in the meantime.             Thanks,      Samara Deist ------

## 2013-01-24 NOTE — Telephone Encounter (Signed)
I called the pt to let him know about his cardioversion and he had a question about eating. He was concerned bc the procedure was at 3:30 in the afternoon.

## 2013-01-24 NOTE — Telephone Encounter (Signed)
pc to pt to inform of Dr. Hazle Coca clearance for pt to return to cardiac rehab. Pt will plan to attend Monday 01/27/13

## 2013-01-27 ENCOUNTER — Encounter (HOSPITAL_COMMUNITY): Admission: RE | Admit: 2013-01-27 | Payer: Medicaid Other | Source: Ambulatory Visit

## 2013-01-29 ENCOUNTER — Encounter: Payer: Self-pay | Admitting: Physician Assistant

## 2013-01-29 ENCOUNTER — Encounter (HOSPITAL_COMMUNITY)
Admission: RE | Admit: 2013-01-29 | Discharge: 2013-01-29 | Disposition: A | Discharge status: Home / Self Care | Payer: Medicaid Other | Source: Ambulatory Visit | Attending: Cardiovascular Disease | Admitting: Cardiovascular Disease

## 2013-01-29 ENCOUNTER — Telehealth: Payer: Self-pay | Admitting: Cardiovascular Disease

## 2013-01-29 ENCOUNTER — Ambulatory Visit (INDEPENDENT_AMBULATORY_CARE_PROVIDER_SITE_OTHER): Payer: Medicaid Other | Admitting: Physician Assistant

## 2013-01-29 VITALS — BP 140/80 | HR 98 | Ht 70.0 in | Wt 204.0 lb

## 2013-01-29 DIAGNOSIS — I739 Peripheral vascular disease, unspecified: Secondary | ICD-10-CM | POA: Insufficient documentation

## 2013-01-29 DIAGNOSIS — Z5189 Encounter for other specified aftercare: Secondary | ICD-10-CM | POA: Insufficient documentation

## 2013-01-29 DIAGNOSIS — I2589 Other forms of chronic ischemic heart disease: Secondary | ICD-10-CM | POA: Insufficient documentation

## 2013-01-29 DIAGNOSIS — I4891 Unspecified atrial fibrillation: Secondary | ICD-10-CM | POA: Insufficient documentation

## 2013-01-29 DIAGNOSIS — J4489 Other specified chronic obstructive pulmonary disease: Secondary | ICD-10-CM | POA: Insufficient documentation

## 2013-01-29 DIAGNOSIS — J449 Chronic obstructive pulmonary disease, unspecified: Secondary | ICD-10-CM | POA: Insufficient documentation

## 2013-01-29 DIAGNOSIS — I10 Essential (primary) hypertension: Secondary | ICD-10-CM | POA: Insufficient documentation

## 2013-01-29 DIAGNOSIS — I079 Rheumatic tricuspid valve disease, unspecified: Secondary | ICD-10-CM | POA: Insufficient documentation

## 2013-01-29 DIAGNOSIS — I2510 Atherosclerotic heart disease of native coronary artery without angina pectoris: Secondary | ICD-10-CM | POA: Insufficient documentation

## 2013-01-29 DIAGNOSIS — I509 Heart failure, unspecified: Secondary | ICD-10-CM

## 2013-01-29 DIAGNOSIS — R0602 Shortness of breath: Secondary | ICD-10-CM

## 2013-01-29 DIAGNOSIS — I359 Nonrheumatic aortic valve disorder, unspecified: Secondary | ICD-10-CM | POA: Insufficient documentation

## 2013-01-29 DIAGNOSIS — I5041 Acute combined systolic (congestive) and diastolic (congestive) heart failure: Secondary | ICD-10-CM

## 2013-01-29 NOTE — Telephone Encounter (Signed)
Carlette concerned about patient today . He  Is returning to cardiac rehab today. He has an significant weight gain. Last wgt 189 now 203. He last went to rehab in Aug. 2014 Sx SOB,CRACKLES on  right side per Carlette ?CARDIOVERSION  Ssometime in NOV Appointment made for EXTENDER TODAY 2:20 PM  Carlette informed patient.He will be here

## 2013-01-29 NOTE — Progress Notes (Signed)
Pt returned to exercise today with the clearance to return by MD prior to his cardioversion which is scheduled 02/2013.  Pt with visible shortness of breath as he entered the gym using a rolling walker.  This improved with rest, o2 sat checked 97%, monitor showed afib.  Pt able to participate in exercise with modified workloads and frequent rest breaks due to overall deconditioning.  O2 sat monitor closely during exercise with noted 91% with ambulation on the track.  Noted that pt weight was elevated 6.5kg from his previous session which was 12/09/12.  Weight verified and pt had noted that his weight was elevated at home as well.  Lung sound clear with crackles/rales to lower base.  Pt with edema noted to lower leg which was non-pittting.  Pt reported that he takes diuretic therapy daily although he omitted this morning due to coming to exercise this morning.  Pt was told to double his lasix to 40 mg on the first three days of the month then 20mg  on each day afterward.  Pt not advised to take a prn lasix for any weight gain noted at home.  Office called and relayed assessment.  Appointment made for him to be seen by Huey Bienenstock PA at 2:20.  Pt in agreement with this and verbalized understanding.

## 2013-01-29 NOTE — Assessment & Plan Note (Addendum)
The patient is clearly volume overloaded with LEE and 20# weight gain and contributing to his dyspnea.  This is likely a combination of systolic, diastolic and afib.  Increase lasix to 40mg  BID for five days.  If weight trends down, decrease dose to 40mg  daily.  BNP tomorrow and BMET prior to OV in two weeks.   He takes 40 of K+ daily.

## 2013-01-29 NOTE — Patient Instructions (Signed)
1.  Increase Lasix to 40mg  twice daily for five days.  If your weight is steadily coming down, decrease dose to 40mg  daily.  2.  Labs:  BNP today.  BMET in two weeks prior to next appt.  3.  Follow up in two weeks.

## 2013-01-29 NOTE — Progress Notes (Signed)
Date:  01/29/2013   ID:  [name], DOB 1951/02/04, MRN [id number]  PCP:  No PCP Per Patient  Primary Cardiologist:  Allyson Sabal     History of Present Illness: [name] is a 62 y.o. male with a history of CAD (RCA) and severe AS. He had a tissue AVR placed as well as CABG X 2 to the PL and PDA. He had significant problems with PAF and COPD. He had a prolonged hospitalization. He had a Maze at the time of his heart surgery. He was discharge to [facility] in [place] on Coumadin and Amiodarone. He was seen in the office 11/13/12 as a referral from cardiac rehab for tachycardia. He was asymptomatic. In the office an EKG confirmed atrial flutter. We increased his Amiodarone to 200 mg BID. He remains asymptomatic in atrial flutter with 2:1 conduction and a VR of 108. Plans were per Dr Allyson Sabal. The plan is for 4 weeks of documented therapeutic INR and then an OP cardioversion.   This was postponed until November.  He presents today after being seen at cardiac rehab where he developed severe dyspnea with exertion.  He also reports a twenty pound weight gain.  After looking at his chart, ten of those pounds were in the last month.  He suprisingly y denies orthopnea, ower extremity edema, dizziness, dizziness and also denies nausea, vomiting, fever, chest pain,  PND, cough, congestion, abdominal pain, hematochezia, melena.  Wt Readings from Last 3 Encounters:  01/29/13 204 lb (92.534 kg)  01/14/13 201 lb 4.8 oz (91.309 kg)  12/26/12 195 lb (88.451 kg)     Past Medical History  Diagnosis Date  . Hypertension   . Gout   . Aortic stenosis     echo 06/19/08 with nomal LV function, moderate concentric LVH moderate aortic stenosis area 0.99 cm squared, peak gradient of 50 and mean of 31  . Atrial fibrillation   . Hyperlipidemia   . Atrial fibrillation with RVR, was in atrial fib in 04/2011 09/16/2012  . Tobacco use 09/16/2012  . Heart murmur   . Acute combined systolic and diastolic CHF, NYHA class  3 -- previous normal LV function and moderate AS- re-evaluate; EF now 30-35% with regional [facility] 09/16/2012  . Aortic stenosis, severe 09/23/2012  . CAD (coronary artery disease), single vessel disease 09/23/2012  . Cellulitis, lower extremity- treated 09/23/2012  . PAD (peripheral artery disease), decreased bil. ABIs 09/23/2012  . Gout flare. 09/29/12, Lt knee, improved with colchicine. 09/30/2012  . H/O aortic valve replacement   . Congestive heart failure     Current Outpatient Prescriptions  Medication Sig Dispense Refill  . amiodarone (PACERONE) 200 MG tablet Take 200 mg by mouth 2 (two) times daily.       Marland Kitchen apixaban (ELIQUIS) 5 MG TABS tablet Take 1 tablet (5 mg total) by mouth 2 (two) times daily.  60 tablet  1  . carvedilol (COREG) 6.25 MG tablet Take 1 tablet (6.25 mg total) by mouth 2 (two) times daily with a meal.  180 tablet  3  . digoxin (LANOXIN) 0.125 MG tablet Take 1 tablet (0.125 mg total) by mouth daily.  90 tablet  3  . furosemide (LASIX) 20 MG tablet Take two tablets daily for 3 days, then one tablet daily  35 tablet  6  . oxyCODONE (OXY IR/ROXICODONE) 5 MG immediate release tablet Take 5 mg by mouth every 3 (three) hours as needed for pain.      Marland Kitchen  potassium chloride SA (K-DUR,KLOR-CON) 20 MEQ tablet Take 1 tablet (20 mEq total) by mouth 2 (two) times daily.  60 tablet  5  . simvastatin (ZOCOR) 20 MG tablet Take 1 tablet (20 mg total) by mouth at bedtime.  90 tablet  3   No current facility-administered medications for this visit.    Allergies:   No Known Allergies  Social History:  The patient  reports that he quit smoking about 4 months ago. His smoking use included Cigarettes. He has a 43 pack-year smoking history. He has never used smokeless tobacco. He reports that he drinks about 16.8 ounces of alcohol per week. He reports that he does not use illicit drugs.   Family history:   Family History  Problem Relation Age of Onset  . Heart attack Father     died at 86 with MI  .  Heart disease Brother   . Heart attack Brother   . Heart attack Brother     ROS:  Please see the history of present illness.  All other systems reviewed and negative.   PHYSICAL EXAM: VS:  BP 140/80  Pulse 98  Ht 5\' 10"  (1.778 m)  Wt 204 lb (92.534 kg)  BMI 29.27 kg/m2 Well nourished, well developed, in no acute distress HEENT: Pupils are equal round react to light accommodation extraocular movements are intact.  Neck: No cervical lymphadenopathy. Cardiac: Regular rate and rhythm without murmurs rubs or gallops. Lungs:  clear to auscultation bilaterally, no wheezing, rhonchi or rales Abd: distended,  Dull to percussion, nontender, positive bowel sounds all quadrants Ext: 2+ lower extremity edema.  2+ radial and 1+ dorsalis pedis pulses. Skin: warm and dry Neuro:  Grossly normal  EKG:  Afib with a rate of 98.     ASSESSMENT AND PLAN:  Problem List Items Addressed This Visit   Acute combined systolic and diastolic CHF, NYHA class 3 -- previous normal LV function and moderate AS- re-evaluate; EF now 30-35% with regional [facility]     The patient is clearly volume overloaded with LEE and 20# weight gain and contributing to his dyspnea.  This is likely a combination of systolic, diastolic and afib.  Increase lasix to 40mg  BID for five days.  If weight trends down, decrease dose to 40mg  daily.  BNP tomorrow and BMET prior to OV in two weeks.   He takes 40 of K+ daily.     Other Visit Diagnoses   SOB (shortness of breath)    -  Primary    Relevant Orders       EKG 12-Lead       B Nat Peptide       Basic Metabolic Panel (BMET)

## 2013-01-30 ENCOUNTER — Other Ambulatory Visit: Payer: Self-pay | Admitting: Physician Assistant

## 2013-01-30 LAB — BASIC METABOLIC PANEL
BUN: 21 mg/dL (ref 6–23)
Potassium: 4.4 mEq/L (ref 3.5–5.3)
Sodium: 138 mEq/L (ref 135–145)

## 2013-01-31 ENCOUNTER — Telehealth: Payer: Self-pay | Admitting: Cardiovascular Disease

## 2013-01-31 ENCOUNTER — Encounter (HOSPITAL_COMMUNITY)
Admission: RE | Admit: 2013-01-31 | Discharge: 2013-01-31 | Disposition: A | Discharge status: Home / Self Care | Payer: Medicaid Other | Source: Ambulatory Visit | Attending: Cardiovascular Disease | Admitting: Cardiovascular Disease

## 2013-01-31 MED ORDER — APIXABAN 5 MG PO TABS: 5.0000 mg | ORAL_TABLET | Freq: Two times a day (BID) | ORAL | Status: DC

## 2013-01-31 NOTE — Telephone Encounter (Signed)
Belenda Cruise gave him 30 day samples of eliquis   Says is out and called phone number for free 30 day trial and they said for him to call us and get new rx written.Marland Kitchen Please call .

## 2013-02-03 ENCOUNTER — Ambulatory Visit: Payer: Self-pay | Admitting: Pharmacist Clinician (PhC)/ Clinical Pharmacy Specialist

## 2013-02-03 ENCOUNTER — Encounter (HOSPITAL_COMMUNITY): Admission: RE | Admit: 2013-02-03 | Payer: Medicaid Other | Source: Ambulatory Visit

## 2013-02-03 DIAGNOSIS — Z7901 Long term (current) use of anticoagulants: Secondary | ICD-10-CM

## 2013-02-03 DIAGNOSIS — I4891 Unspecified atrial fibrillation: Secondary | ICD-10-CM

## 2013-02-05 ENCOUNTER — Telehealth (HOSPITAL_COMMUNITY): Payer: Self-pay | Admitting: Cardiac Rehabilitation

## 2013-02-05 ENCOUNTER — Encounter (HOSPITAL_COMMUNITY): Payer: Medicaid Other

## 2013-02-05 NOTE — Telephone Encounter (Signed)
pc to pt to assess reason for continued absence from cardiac rehab.  lmom 

## 2013-02-07 ENCOUNTER — Encounter (HOSPITAL_COMMUNITY): Payer: Medicaid Other

## 2013-02-10 ENCOUNTER — Encounter (HOSPITAL_COMMUNITY): Payer: Medicaid Other

## 2013-02-11 ENCOUNTER — Other Ambulatory Visit: Payer: Self-pay | Admitting: Physician Assistant

## 2013-02-11 LAB — BRAIN NATRIURETIC PEPTIDE: Brain Natriuretic Peptide: 165 pg/mL — ABNORMAL HIGH (ref 0.0–100.0)

## 2013-02-12 ENCOUNTER — Encounter: Payer: Self-pay | Admitting: Physician Assistant

## 2013-02-12 ENCOUNTER — Encounter (HOSPITAL_COMMUNITY): Payer: Medicaid Other

## 2013-02-12 ENCOUNTER — Ambulatory Visit (INDEPENDENT_AMBULATORY_CARE_PROVIDER_SITE_OTHER): Payer: Medicaid Other | Admitting: Physician Assistant

## 2013-02-12 VITALS — BP 130/80 | HR 115 | Ht 70.0 in | Wt 197.0 lb

## 2013-02-12 DIAGNOSIS — I4891 Unspecified atrial fibrillation: Secondary | ICD-10-CM

## 2013-02-12 DIAGNOSIS — R600 Localized edema: Secondary | ICD-10-CM

## 2013-02-12 DIAGNOSIS — R609 Edema, unspecified: Secondary | ICD-10-CM

## 2013-02-12 DIAGNOSIS — M7989 Other specified soft tissue disorders: Secondary | ICD-10-CM

## 2013-02-12 MED ORDER — CARVEDILOL 6.25 MG PO TABS: 12.5000 mg | ORAL_TABLET | Freq: Two times a day (BID) | ORAL | Status: DC

## 2013-02-12 NOTE — Assessment & Plan Note (Signed)
Patient noticed improvement with the and weight and lower extremity edema Lasix 40 mg twice daily. When we cut back to 20 mg twice a day the weight started to come back as did the lower extremity edema. Asked him to increase it back to 40 mg twice daily PRN and monitor his weight continually and increase his dosage in the morning as needed.  It may be that once he was cardioverted and will need to rethink the Lasix dosage again

## 2013-02-12 NOTE — Assessment & Plan Note (Signed)
He continues in atrial fibrillation with a rapid rate of 115 beats per minute. Going to increase his Coreg to 12.5 mg twice daily.  He is due to be cardioverted by Dr. Allyson Sabal in about a week.

## 2013-02-12 NOTE — Progress Notes (Signed)
Date:  02/12/2013   ID:  [name], DOB 1951-04-21, MRN [id number]  PCP:  No PCP Per Patient  Primary Cardiologist:  Allyson Sabal     History of Present Illness: [name] is a 62 y.o. male with a history of CAD (RCA) and severe AS. He had a tissue AVR placed as well as CABG X 2 to the PL and PDA. He had significant problems with PAF and COPD. He had a prolonged hospitalization. He had a Maze at the time of his heart surgery. He was discharge to [facility] in [place] on Coumadin and Amiodarone. He was seen in the office 11/13/12 as a referral from cardiac rehab for tachycardia. He was asymptomatic. In the office an EKG confirmed atrial flutter. We increased his Amiodarone to 200 mg BID. He remains asymptomatic in atrial flutter with 2:1 conduction and a VR of 108. Plans were per Dr Allyson Sabal. The plan is for 4 weeks of documented therapeutic INR and then an OP cardioversion. This was postponed until November   The patient noticed a large weight loss and improvement in LEE when taking 40mg  of lasix twice daily.  When he went back to 20mg  BID the weight started coming back along with LEE.  He also reports DOE but otherwise denies nausea, vomiting, fever, chest pain, orthopnea, dizziness, PND, cough, congestion, abdominal pain, hematochezia, melena, claudication.  Wt Readings from Last 3 Encounters:  02/12/13 197 lb (89.359 kg)  01/29/13 204 lb (92.534 kg)  01/14/13 201 lb 4.8 oz (91.309 kg)     Past Medical History  Diagnosis Date  . Hypertension   . Gout   . Aortic stenosis     echo 06/19/08 with nomal LV function, moderate concentric LVH moderate aortic stenosis area 0.99 cm squared, peak gradient of 50 and mean of 31  . Atrial fibrillation   . Hyperlipidemia   . Atrial fibrillation with RVR, was in atrial fib in 04/2011 09/16/2012  . Tobacco use 09/16/2012  . Heart murmur   . Acute combined systolic and diastolic CHF, NYHA class 3 -- previous normal LV function and moderate AS-  re-evaluate; EF now 30-35% with regional [facility] 09/16/2012  . Aortic stenosis, severe 09/23/2012  . CAD (coronary artery disease), single vessel disease 09/23/2012  . Cellulitis, lower extremity- treated 09/23/2012  . PAD (peripheral artery disease), decreased bil. ABIs 09/23/2012  . Gout flare. 09/29/12, Lt knee, improved with colchicine. 09/30/2012  . H/O aortic valve replacement   . Congestive heart failure     Current Outpatient Prescriptions  Medication Sig Dispense Refill  . amiodarone (PACERONE) 200 MG tablet Take 200 mg by mouth 2 (two) times daily.       Marland Kitchen apixaban (ELIQUIS) 5 MG TABS tablet Take 1 tablet (5 mg total) by mouth 2 (two) times daily.  60 tablet  3  . carvedilol (COREG) 6.25 MG tablet Take 2 tablets (12.5 mg total) by mouth 2 (two) times daily with a meal.  180 tablet  3  . digoxin (LANOXIN) 0.125 MG tablet Take 1 tablet (0.125 mg total) by mouth daily.  90 tablet  3  . furosemide (LASIX) 20 MG tablet Take 20 mg by mouth 2 (two) times daily.      Marland Kitchen oxyCODONE (OXY IR/ROXICODONE) 5 MG immediate release tablet Take 5 mg by mouth every 3 (three) hours as needed for pain.      . potassium chloride SA (K-DUR,KLOR-CON) 20 MEQ tablet Take 1 tablet (20 mEq total) by  mouth 2 (two) times daily.  60 tablet  5  . simvastatin (ZOCOR) 20 MG tablet Take 1 tablet (20 mg total) by mouth at bedtime.  90 tablet  3  . furosemide (LASIX) 20 MG tablet 20 mg 2 (two) times daily. Take two tablets daily for 3 days, then one tablet daily       No current facility-administered medications for this visit.    Allergies:   No Known Allergies  Social History:  The patient  reports that he quit smoking about 5 months ago. His smoking use included Cigarettes. He has a 43 pack-year smoking history. He has never used smokeless tobacco. He reports that he drinks about 16.8 ounces of alcohol per week. He reports that he does not use illicit drugs.   Family history:   Family History  Problem Relation Age of Onset  .  Heart attack Father     died at 62 with MI  . Heart disease Brother   . Heart attack Brother   . Heart attack Brother     ROS:  Please see the history of present illness.  All other systems reviewed and negative.   PHYSICAL EXAM: VS:  BP 130/80  Pulse 115  Ht 5\' 10"  (1.778 m)  Wt 197 lb (89.359 kg)  BMI 28.27 kg/m2 Well nourished, well developed, in no acute distress HEENT: Pupils are equal round react to light accommodation extraocular movements are intact.  Neck: no JVDNo cervical lymphadenopathy. Cardiac: Regular rhythm, rate fast  without murmurs rubs or gallops. Lungs:  clear to auscultation bilaterally, no wheezing, rhonchi or rales Abd: soft, nontender, positive bowel sounds all quadrants, Ext: 2+ lower extremity edema.  1+ radial and dorsalis pedis pulses. Skin: warm and dry Neuro:  Grossly normal  EKG:  Afib RVR  ASSESSMENT AND PLAN:  Problem List Items Addressed This Visit   Bilateral lower extremity edema, secondary to chronic CHF     Patient noticed improvement with the and weight and lower extremity edema Lasix 40 mg twice daily. When we cut back to 20 mg twice a day the weight started to come back as did the lower extremity edema. Asked him to increase it back to 40 mg twice daily PRN and monitor his weight continually and increase his dosage in the morning as needed.  It may be that once he was cardioverted and will need to rethink the Lasix dosage again    Atrial fibrillation with RVR - Primary     He continues in atrial fibrillation with a rapid rate of 115 beats per minute. Going to increase his Coreg to 12.5 mg twice daily.  He is due to be cardioverted by Dr. Allyson Sabal in about a week.    Relevant Medications      furosemide (LASIX) 20 MG tablet      furosemide (LASIX) 20 MG tablet      carvedilol (COREG) tablet

## 2013-02-12 NOTE — Patient Instructions (Addendum)
1.  Continue lasix at 20mg  twice daily.  If your weight increases by 2 # in 24 hours or 5 # in 7 days, increase lasix to 40mg  in the morning and 20mg  in the evening.  No more than 40mg  twice daily.   2.  Wear compression socks daily. 3.  Will schedule an appt after the cardioversion. 4.  Increase Coreg to 12.5mg  twice daily.

## 2013-02-14 ENCOUNTER — Encounter (HOSPITAL_COMMUNITY): Payer: Medicaid Other

## 2013-02-17 ENCOUNTER — Encounter (HOSPITAL_COMMUNITY): Payer: Medicaid Other

## 2013-02-18 ENCOUNTER — Telehealth: Payer: Self-pay | Admitting: Cardiovascular Disease

## 2013-02-18 MED ORDER — AMIODARONE HCL 200 MG PO TABS: 200.0000 mg | ORAL_TABLET | Freq: Two times a day (BID) | ORAL | Status: DC

## 2013-02-18 MED ORDER — FUROSEMIDE 20 MG PO TABS: 20.0000 mg | ORAL_TABLET | Freq: Two times a day (BID) | ORAL | Status: DC

## 2013-02-18 NOTE — Telephone Encounter (Signed)
Please call-concerning his medicine-since medicine was changed it messed up his refills.

## 2013-02-18 NOTE — Telephone Encounter (Signed)
Returned call and pt verified x 2.  Pt stated furosemide 20 mg was increased and he takes 60 mg total daily now.  RN reviewed AVS and read instructions w/ pt.  Pt informed he should be taking 20 mg twice daily unless he has the wt gain as mentioned and he takes an extra 20 mg in the AM.  Pt verbalized understanding.  Pt also stated amiodarone was increased to 200 mg twice daily from once daily.  Refill(s) sent to pharmacy, [facility] [address]  Pt verbalized understanding and agreed w/ plan.

## 2013-02-19 ENCOUNTER — Encounter (HOSPITAL_COMMUNITY): Payer: Medicaid Other

## 2013-02-21 ENCOUNTER — Encounter (HOSPITAL_COMMUNITY): Payer: Medicaid Other

## 2013-02-21 ENCOUNTER — Encounter (HOSPITAL_COMMUNITY): Payer: Self-pay | Admitting: Pharmacist

## 2013-02-24 ENCOUNTER — Encounter (HOSPITAL_COMMUNITY): Payer: Self-pay | Admitting: Pharmacist

## 2013-02-24 ENCOUNTER — Encounter (HOSPITAL_COMMUNITY): Payer: Medicaid Other

## 2013-02-25 ENCOUNTER — Encounter (HOSPITAL_COMMUNITY): Payer: Medicaid Other | Admitting: Anesthesiology

## 2013-02-25 ENCOUNTER — Ambulatory Visit (HOSPITAL_COMMUNITY): Payer: Medicaid Other | Admitting: Anesthesiology

## 2013-02-25 ENCOUNTER — Ambulatory Visit (HOSPITAL_COMMUNITY)
Admission: RE | Admit: 2013-02-25 | Discharge: 2013-02-25 | Disposition: A | Discharge status: Home / Self Care | Payer: Medicaid Other | Source: Ambulatory Visit | Attending: Cardiovascular Disease | Admitting: Cardiovascular Disease

## 2013-02-25 ENCOUNTER — Encounter (HOSPITAL_COMMUNITY)
Admission: RE | Disposition: A | Discharge status: Home / Self Care | Payer: Medicaid Other | Source: Ambulatory Visit | Attending: Cardiovascular Disease

## 2013-02-25 ENCOUNTER — Other Ambulatory Visit: Payer: Self-pay | Admitting: *Deleted

## 2013-02-25 ENCOUNTER — Encounter (HOSPITAL_COMMUNITY): Payer: Self-pay

## 2013-02-25 DIAGNOSIS — I10 Essential (primary) hypertension: Secondary | ICD-10-CM | POA: Insufficient documentation

## 2013-02-25 DIAGNOSIS — I2510 Atherosclerotic heart disease of native coronary artery without angina pectoris: Secondary | ICD-10-CM | POA: Insufficient documentation

## 2013-02-25 DIAGNOSIS — I509 Heart failure, unspecified: Secondary | ICD-10-CM | POA: Insufficient documentation

## 2013-02-25 DIAGNOSIS — J449 Chronic obstructive pulmonary disease, unspecified: Secondary | ICD-10-CM | POA: Insufficient documentation

## 2013-02-25 DIAGNOSIS — J4489 Other specified chronic obstructive pulmonary disease: Secondary | ICD-10-CM | POA: Insufficient documentation

## 2013-02-25 DIAGNOSIS — Z951 Presence of aortocoronary bypass graft: Secondary | ICD-10-CM | POA: Insufficient documentation

## 2013-02-25 DIAGNOSIS — Z7901 Long term (current) use of anticoagulants: Secondary | ICD-10-CM | POA: Insufficient documentation

## 2013-02-25 DIAGNOSIS — Z01818 Encounter for other preprocedural examination: Secondary | ICD-10-CM

## 2013-02-25 DIAGNOSIS — I4891 Unspecified atrial fibrillation: Secondary | ICD-10-CM

## 2013-02-25 DIAGNOSIS — I4892 Unspecified atrial flutter: Secondary | ICD-10-CM | POA: Insufficient documentation

## 2013-02-25 HISTORY — PX: CARDIOVERSION: SHX1299

## 2013-02-25 LAB — BASIC METABOLIC PANEL
BUN: 18 mg/dL (ref 6–23)
CO2: 27 mEq/L (ref 19–32)
Chloride: 99 mEq/L (ref 96–112)
Creatinine, Ser: 1.19 mg/dL (ref 0.50–1.35)
GFR calc Af Amer: 73 mL/min — ABNORMAL LOW (ref >90)
Glucose, Bld: 86 mg/dL (ref 70–99)

## 2013-02-25 LAB — PROTIME-INR: INR: 1.33 (ref 0.00–1.49)

## 2013-02-25 SURGERY — CARDIOVERSION
Anesthesia: Monitor Anesthesia Care

## 2013-02-25 MED ORDER — PROPOFOL 10 MG/ML IV BOLUS
INTRAVENOUS | Status: DC | PRN
  Administered 2013-02-25: 70 mg via INTRAVENOUS

## 2013-02-25 MED ORDER — SODIUM CHLORIDE 0.9 % IV SOLN
INTRAVENOUS | Status: DC
  Administered 2013-02-25: 15:00:00 via INTRAVENOUS

## 2013-02-25 NOTE — Transfer of Care (Signed)
Immediate Anesthesia Transfer of Care Note  Patient: [name]  Procedure(s) Performed: Procedure(s): CARDIOVERSION (N/A)  Patient Location: Short Stay  Anesthesia Type:MAC  Level of Consciousness: awake, alert  and oriented  Airway & Oxygen Therapy: Patient Spontanous Breathing and Patient connected to nasal cannula oxygen  Post-op Assessment: Report given to PACU RN  Post vital signs: Reviewed and stable  Complications: No apparent anesthesia complications

## 2013-02-25 NOTE — Addendum Note (Signed)
Addendum created 02/25/13 1730 by Judie Petit, MD   Modules edited: Anesthesia Attestations

## 2013-02-25 NOTE — Op Note (Signed)
Procedure: Electrical Cardioversion Indications:  Atrial Fibrillation  Procedure Details:  Consent: Risks of procedure as well as the alternatives and risks of each were explained to the (patient/caregiver).  Consent for procedure obtained.  Time Out: Verified patient identification, verified procedure, site/side was marked, verified correct patient position, special equipment/implants available, medications/allergies/relevent history reviewed, required imaging and test results available.  Performed  Patient placed on cardiac monitor, pulse oximetry, supplemental oxygen as necessary.  Sedation given: propofol 70 mg IV, Dr. Randa Evens Pacer pads placed anterior and posterior chest.  Cardioverted 1 time(s).  Cardioverted at 120J. Synchronized biphasic  Evaluation: Findings: Post procedure EKG shows: NSR 70 bpm Complications: None Patient did tolerate procedure well.  Time Spent Directly with the Patient:  35 minutes   [name] 02/25/2013, 5:47 PM

## 2013-02-25 NOTE — H&P (View-Only) (Signed)
  Date:  02/12/2013   ID:  [name], DOB 01/12/1951, MRN [id number]  PCP:  No PCP Per Patient  Primary Cardiologist:  Berry     History of Present Illness: [name] is a 62 y.o. male with a history of CAD (RCA) and severe AS. He had a tissue AVR placed as well as CABG X 2 to the PL and PDA. He had significant problems with PAF and COPD. He had a prolonged hospitalization. He had a Maze at the time of his heart surgery. He was discharge to [facility] in [place] on Coumadin and Amiodarone. He was seen in the office 11/13/12 as a referral from cardiac rehab for tachycardia. He was asymptomatic. In the office an EKG confirmed atrial flutter. We increased his Amiodarone to 200 mg BID. He remains asymptomatic in atrial flutter with 2:1 conduction and a VR of 108. Plans were per Dr Berry. The plan is for 4 weeks of documented therapeutic INR and then an OP cardioversion. This was postponed until November   The patient noticed a large weight loss and improvement in LEE when taking 40mg of lasix twice daily.  When he went back to 20mg BID the weight started coming back along with LEE.  He also reports DOE but otherwise denies nausea, vomiting, fever, chest pain, orthopnea, dizziness, PND, cough, congestion, abdominal pain, hematochezia, melena, claudication.  Wt Readings from Last 3 Encounters:  02/12/13 197 lb (89.359 kg)  01/29/13 204 lb (92.534 kg)  01/14/13 201 lb 4.8 oz (91.309 kg)     Past Medical History  Diagnosis Date  . Hypertension   . Gout   . Aortic stenosis     echo 06/19/08 with nomal LV function, moderate concentric LVH moderate aortic stenosis area 0.99 cm squared, peak gradient of 50 and mean of 31  . Atrial fibrillation   . Hyperlipidemia   . Atrial fibrillation with RVR, was in atrial fib in 04/2011 09/16/2012  . Tobacco use 09/16/2012  . Heart murmur   . Acute combined systolic and diastolic CHF, NYHA class 3 -- previous normal LV function and moderate AS-  re-evaluate; EF now 30-35% with regional [facility] 09/16/2012  . Aortic stenosis, severe 09/23/2012  . CAD (coronary artery disease), single vessel disease 09/23/2012  . Cellulitis, lower extremity- treated 09/23/2012  . PAD (peripheral artery disease), decreased bil. ABIs 09/23/2012  . Gout flare. 09/29/12, Lt knee, improved with colchicine. 09/30/2012  . H/O aortic valve replacement   . Congestive heart failure     Current Outpatient Prescriptions  Medication Sig Dispense Refill  . amiodarone (PACERONE) 200 MG tablet Take 200 mg by mouth 2 (two) times daily.       . apixaban (ELIQUIS) 5 MG TABS tablet Take 1 tablet (5 mg total) by mouth 2 (two) times daily.  60 tablet  3  . carvedilol (COREG) 6.25 MG tablet Take 2 tablets (12.5 mg total) by mouth 2 (two) times daily with a meal.  180 tablet  3  . digoxin (LANOXIN) 0.125 MG tablet Take 1 tablet (0.125 mg total) by mouth daily.  90 tablet  3  . furosemide (LASIX) 20 MG tablet Take 20 mg by mouth 2 (two) times daily.      . oxyCODONE (OXY IR/ROXICODONE) 5 MG immediate release tablet Take 5 mg by mouth every 3 (three) hours as needed for pain.      . potassium chloride SA (K-DUR,KLOR-CON) 20 MEQ tablet Take 1 tablet (20 mEq total) by   mouth 2 (two) times daily.  60 tablet  5  . simvastatin (ZOCOR) 20 MG tablet Take 1 tablet (20 mg total) by mouth at bedtime.  90 tablet  3  . furosemide (LASIX) 20 MG tablet 20 mg 2 (two) times daily. Take two tablets daily for 3 days, then one tablet daily       No current facility-administered medications for this visit.    Allergies:   No Known Allergies  Social History:  The patient  reports that he quit smoking about 5 months ago. His smoking use included Cigarettes. He has a 43 pack-year smoking history. He has never used smokeless tobacco. He reports that he drinks about 16.8 ounces of alcohol per week. He reports that he does not use illicit drugs.   Family history:   Family History  Problem Relation Age of Onset  .  Heart attack Father     died at 56 with MI  . Heart disease Brother   . Heart attack Brother   . Heart attack Brother     ROS:  Please see the history of present illness.  All other systems reviewed and negative.   PHYSICAL EXAM: VS:  BP 130/80  Pulse 115  Ht 5' 10" (1.778 m)  Wt 197 lb (89.359 kg)  BMI 28.27 kg/m2 Well nourished, well developed, in no acute distress HEENT: Pupils are equal round react to light accommodation extraocular movements are intact.  Neck: no JVDNo cervical lymphadenopathy. Cardiac: Regular rhythm, rate fast  without murmurs rubs or gallops. Lungs:  clear to auscultation bilaterally, no wheezing, rhonchi or rales Abd: soft, nontender, positive bowel sounds all quadrants, Ext: 2+ lower extremity edema.  1+ radial and dorsalis pedis pulses. Skin: warm and dry Neuro:  Grossly normal  EKG:  Afib RVR  ASSESSMENT AND PLAN:  Problem List Items Addressed This Visit   Bilateral lower extremity edema, secondary to chronic CHF     Patient noticed improvement with the and weight and lower extremity edema Lasix 40 mg twice daily. When we cut back to 20 mg twice a day the weight started to come back as did the lower extremity edema. Asked him to increase it back to 40 mg twice daily PRN and monitor his weight continually and increase his dosage in the morning as needed.  It may be that once he was cardioverted and will need to rethink the Lasix dosage again    Atrial fibrillation with RVR - Primary     He continues in atrial fibrillation with a rapid rate of 115 beats per minute. Going to increase his Coreg to 12.5 mg twice daily.  He is due to be cardioverted by Dr. Berry in about a week.    Relevant Medications      furosemide (LASIX) 20 MG tablet      furosemide (LASIX) 20 MG tablet      carvedilol (COREG) tablet         

## 2013-02-25 NOTE — Interval H&P Note (Signed)
History and Physical Interval Note:  02/25/2013 4:35 PM  [name]  has presented today for synchronized cardioversion, with the diagnosis of afib/aflutter  The various methods of treatment have been discussed with the patient and family. After consideration of risks, benefits and other options for treatment, the patient has consented to  Procedure(s): CARDIOVERSION (N/A) as a surgical intervention .  The patient's history has been reviewed, patient examined, no change in status, stable for surgery.  I have reviewed the patient's chart and labs.  Questions were answered to the patient's satisfaction.     [name]

## 2013-02-25 NOTE — Anesthesia Postprocedure Evaluation (Signed)
  Anesthesia Post-op Note  Patient: PRIYANSH PRY  Procedure(s) Performed: Procedure(s): CARDIOVERSION (N/A)  Patient Location: Endoscopy Unit  Anesthesia Type:MAC  Level of Consciousness: awake, alert  and oriented  Airway and Oxygen Therapy: Patient Spontanous Breathing and Patient connected to nasal cannula oxygen  Post-op Pain: none  Post-op Assessment: Post-op Vital signs reviewed, Patient's Cardiovascular Status Stable, Respiratory Function Stable, Patent Airway, No signs of Nausea or vomiting and Pain level controlled  Post-op Vital Signs: Reviewed and stable  Complications: No apparent anesthesia complications

## 2013-02-25 NOTE — Anesthesia Preprocedure Evaluation (Signed)
Anesthesia Evaluation  Patient identified by MRN, date of birth, ID band Patient awake    Reviewed: Allergy & Precautions, H&P , NPO status , Patient's Chart, lab work & pertinent test results, reviewed documented beta blocker date and time   Airway Mallampati: II TM Distance: >3 FB Neck ROM: Full    Dental  (+) Edentulous Upper and Partial Lower   Pulmonary shortness of breath, COPD   Pulmonary exam normal       Cardiovascular hypertension, Pt. on medications and Pt. on home beta blockers + CAD, + Past MI, + CABG, + Peripheral Vascular Disease and +CHF + dysrhythmias Atrial Fibrillation + Valvular Problems/Murmurs AS Rhythm:Irregular Rate:Tachycardia     Neuro/Psych    GI/Hepatic   Endo/Other    Renal/GU      Musculoskeletal   Abdominal Normal abdominal exam  (+)   Peds  Hematology   Anesthesia Other Findings   Reproductive/Obstetrics                           Anesthesia Physical Anesthesia Plan  ASA: III  Anesthesia Plan: MAC   Post-op Pain Management:    Induction: Intravenous  Airway Management Planned: Mask  Additional Equipment:   Intra-op Plan:   Post-operative Plan:   Informed Consent: I have reviewed the patients History and Physical, chart, labs and discussed the procedure including the risks, benefits and alternatives for the proposed anesthesia with the patient or authorized representative who has indicated his/her understanding and acceptance.   Dental advisory given  Plan Discussed with: CRNA, Anesthesiologist and Surgeon  Anesthesia Plan Comments:         Anesthesia Quick Evaluation

## 2013-02-25 NOTE — Preoperative (Signed)
Beta Blockers   Reason not to administer Beta Blockers:Not Applicable 

## 2013-02-26 ENCOUNTER — Encounter (HOSPITAL_COMMUNITY): Payer: Self-pay | Admitting: Cardiovascular Disease

## 2013-02-26 ENCOUNTER — Encounter (HOSPITAL_COMMUNITY): Admission: RE | Admit: 2013-02-26 | Payer: Medicaid Other | Source: Ambulatory Visit

## 2013-02-26 ENCOUNTER — Telehealth (HOSPITAL_COMMUNITY): Payer: Self-pay | Admitting: Cardiac Rehabilitation

## 2013-02-26 NOTE — Telephone Encounter (Signed)
pc to pt to discuss continued participation in cardiac rehab.  Pt states he has too much hip pain and dyspnea on exertion to continue.  Pt states he is doing seated activities at home.  Pt reports his dyspnea is minimally improved following his cardioversion yesterday.  Pt has scheduled f/u with Dr. Allyson Sabal in 2 weeks.  Dr.  Allyson Sabal made aware ptdesire to  drop cardiac rehab.  Pt states he has been in conversation with his pulmonologist about possible pulmonary rehab participation in the future.

## 2013-02-28 ENCOUNTER — Encounter (HOSPITAL_COMMUNITY): Payer: Medicaid Other

## 2013-03-03 ENCOUNTER — Encounter (HOSPITAL_COMMUNITY): Payer: Medicaid Other

## 2013-03-05 ENCOUNTER — Encounter (HOSPITAL_COMMUNITY): Payer: Medicaid Other

## 2013-03-07 ENCOUNTER — Encounter (HOSPITAL_COMMUNITY): Payer: Medicaid Other

## 2013-03-10 ENCOUNTER — Encounter (HOSPITAL_COMMUNITY): Payer: Medicaid Other

## 2013-03-12 ENCOUNTER — Encounter (HOSPITAL_COMMUNITY): Payer: Medicaid Other

## 2013-03-14 ENCOUNTER — Encounter (HOSPITAL_COMMUNITY): Payer: Medicaid Other

## 2013-03-24 ENCOUNTER — Other Ambulatory Visit: Payer: Self-pay | Admitting: *Deleted

## 2013-04-02 ENCOUNTER — Other Ambulatory Visit: Payer: Self-pay | Admitting: Cardiovascular Disease

## 2013-04-29 ENCOUNTER — Ambulatory Visit: Payer: Medicaid Other | Admitting: Internal Medicine

## 2013-05-21 ENCOUNTER — Telehealth (HOSPITAL_COMMUNITY): Payer: Self-pay | Admitting: *Deleted

## 2013-09-19 ENCOUNTER — Inpatient Hospital Stay (HOSPITAL_COMMUNITY)
Admission: EM | Admit: 2013-09-19 | Discharge: 2013-09-30 | DRG: 291 | Disposition: A | Discharge status: Home Health | Payer: Medicaid Other | Attending: Cardiovascular Disease | Admitting: Cardiovascular Disease

## 2013-09-19 ENCOUNTER — Emergency Department (HOSPITAL_COMMUNITY): Payer: Medicaid Other

## 2013-09-19 DIAGNOSIS — Z23 Encounter for immunization: Secondary | ICD-10-CM | POA: Diagnosis not present

## 2013-09-19 DIAGNOSIS — Z79899 Other long term (current) drug therapy: Secondary | ICD-10-CM | POA: Diagnosis not present

## 2013-09-19 DIAGNOSIS — R600 Localized edema: Secondary | ICD-10-CM

## 2013-09-19 DIAGNOSIS — R609 Edema, unspecified: Secondary | ICD-10-CM

## 2013-09-19 DIAGNOSIS — E669 Obesity, unspecified: Secondary | ICD-10-CM | POA: Diagnosis present

## 2013-09-19 DIAGNOSIS — A419 Sepsis, unspecified organism: Secondary | ICD-10-CM | POA: Diagnosis present

## 2013-09-19 DIAGNOSIS — Z87891 Personal history of nicotine dependence: Secondary | ICD-10-CM | POA: Diagnosis not present

## 2013-09-19 DIAGNOSIS — M109 Gout, unspecified: Secondary | ICD-10-CM | POA: Diagnosis present

## 2013-09-19 DIAGNOSIS — J439 Emphysema, unspecified: Secondary | ICD-10-CM | POA: Diagnosis present

## 2013-09-19 DIAGNOSIS — J9600 Acute respiratory failure, unspecified whether with hypoxia or hypercapnia: Secondary | ICD-10-CM | POA: Diagnosis present

## 2013-09-19 DIAGNOSIS — A329 Listeriosis, unspecified: Secondary | ICD-10-CM | POA: Diagnosis present

## 2013-09-19 DIAGNOSIS — R0603 Acute respiratory distress: Secondary | ICD-10-CM

## 2013-09-19 DIAGNOSIS — I10 Essential (primary) hypertension: Secondary | ICD-10-CM

## 2013-09-19 DIAGNOSIS — Z9119 Patient's noncompliance with other medical treatment and regimen: Secondary | ICD-10-CM | POA: Diagnosis not present

## 2013-09-19 DIAGNOSIS — I4891 Unspecified atrial fibrillation: Secondary | ICD-10-CM | POA: Diagnosis present

## 2013-09-19 DIAGNOSIS — L0390 Cellulitis, unspecified: Secondary | ICD-10-CM

## 2013-09-19 DIAGNOSIS — I5041 Acute combined systolic (congestive) and diastolic (congestive) heart failure: Secondary | ICD-10-CM

## 2013-09-19 DIAGNOSIS — A327 Listerial sepsis: Secondary | ICD-10-CM

## 2013-09-19 DIAGNOSIS — Z91199 Patient's noncompliance with other medical treatment and regimen due to unspecified reason: Secondary | ICD-10-CM

## 2013-09-19 DIAGNOSIS — R5381 Other malaise: Secondary | ICD-10-CM | POA: Diagnosis present

## 2013-09-19 DIAGNOSIS — Z6832 Body mass index (BMI) 32.0-32.9, adult: Secondary | ICD-10-CM | POA: Diagnosis not present

## 2013-09-19 DIAGNOSIS — R6520 Severe sepsis without septic shock: Secondary | ICD-10-CM

## 2013-09-19 DIAGNOSIS — M4804 Spinal stenosis, thoracic region: Secondary | ICD-10-CM | POA: Diagnosis present

## 2013-09-19 DIAGNOSIS — R0989 Other specified symptoms and signs involving the circulatory and respiratory systems: Secondary | ICD-10-CM | POA: Diagnosis present

## 2013-09-19 DIAGNOSIS — I4892 Unspecified atrial flutter: Secondary | ICD-10-CM | POA: Diagnosis present

## 2013-09-19 DIAGNOSIS — N189 Chronic kidney disease, unspecified: Secondary | ICD-10-CM | POA: Diagnosis present

## 2013-09-19 DIAGNOSIS — N179 Acute kidney failure, unspecified: Secondary | ICD-10-CM | POA: Diagnosis present

## 2013-09-19 DIAGNOSIS — I5043 Acute on chronic combined systolic (congestive) and diastolic (congestive) heart failure: Secondary | ICD-10-CM | POA: Diagnosis not present

## 2013-09-19 DIAGNOSIS — Z951 Presence of aortocoronary bypass graft: Secondary | ICD-10-CM

## 2013-09-19 DIAGNOSIS — L02419 Cutaneous abscess of limb, unspecified: Secondary | ICD-10-CM | POA: Diagnosis present

## 2013-09-19 DIAGNOSIS — Z720 Tobacco use: Secondary | ICD-10-CM

## 2013-09-19 DIAGNOSIS — Z952 Presence of prosthetic heart valve: Secondary | ICD-10-CM

## 2013-09-19 DIAGNOSIS — I2510 Atherosclerotic heart disease of native coronary artery without angina pectoris: Secondary | ICD-10-CM | POA: Diagnosis present

## 2013-09-19 DIAGNOSIS — I5033 Acute on chronic diastolic (congestive) heart failure: Secondary | ICD-10-CM

## 2013-09-19 DIAGNOSIS — E785 Hyperlipidemia, unspecified: Secondary | ICD-10-CM | POA: Diagnosis present

## 2013-09-19 DIAGNOSIS — R509 Fever, unspecified: Secondary | ICD-10-CM

## 2013-09-19 DIAGNOSIS — Z7901 Long term (current) use of anticoagulants: Secondary | ICD-10-CM | POA: Diagnosis not present

## 2013-09-19 DIAGNOSIS — I739 Peripheral vascular disease, unspecified: Secondary | ICD-10-CM | POA: Diagnosis present

## 2013-09-19 DIAGNOSIS — J438 Other emphysema: Secondary | ICD-10-CM | POA: Diagnosis present

## 2013-09-19 DIAGNOSIS — F1010 Alcohol abuse, uncomplicated: Secondary | ICD-10-CM | POA: Diagnosis present

## 2013-09-19 DIAGNOSIS — I480 Paroxysmal atrial fibrillation: Secondary | ICD-10-CM | POA: Diagnosis present

## 2013-09-19 DIAGNOSIS — R7881 Bacteremia: Secondary | ICD-10-CM

## 2013-09-19 DIAGNOSIS — I350 Nonrheumatic aortic (valve) stenosis: Secondary | ICD-10-CM

## 2013-09-19 DIAGNOSIS — I255 Ischemic cardiomyopathy: Secondary | ICD-10-CM

## 2013-09-19 DIAGNOSIS — R0609 Other forms of dyspnea: Secondary | ICD-10-CM | POA: Diagnosis not present

## 2013-09-19 DIAGNOSIS — L03119 Cellulitis of unspecified part of limb: Secondary | ICD-10-CM

## 2013-09-19 DIAGNOSIS — M549 Dorsalgia, unspecified: Secondary | ICD-10-CM

## 2013-09-19 DIAGNOSIS — I509 Heart failure, unspecified: Secondary | ICD-10-CM | POA: Diagnosis present

## 2013-09-19 DIAGNOSIS — J449 Chronic obstructive pulmonary disease, unspecified: Secondary | ICD-10-CM

## 2013-09-19 HISTORY — DX: Dorsalgia, unspecified: M54.9

## 2013-09-19 HISTORY — DX: Nonrheumatic aortic (valve) stenosis: I35.0

## 2013-09-19 LAB — PROTIME-INR
INR: 1.04 (ref 0.00–1.49)
PROTHROMBIN TIME: 13.4 s (ref 11.6–15.2)

## 2013-09-19 LAB — CBC
HCT: 35.4 % — ABNORMAL LOW (ref 39.0–52.0)
Hemoglobin: 11.2 g/dL — ABNORMAL LOW (ref 13.0–17.0)
MCH: 27.4 pg (ref 26.0–34.0)
MCHC: 31.6 g/dL (ref 30.0–36.0)
MCV: 86.6 fL (ref 78.0–100.0)
PLATELETS: 178 10*3/uL (ref 150–400)
RBC: 4.09 MIL/uL — AB (ref 4.22–5.81)
RDW: 16.9 % — ABNORMAL HIGH (ref 11.5–15.5)
WBC: 4.8 10*3/uL (ref 4.0–10.5)

## 2013-09-19 LAB — COMPREHENSIVE METABOLIC PANEL
ALK PHOS: 65 U/L (ref 39–117)
ALT: 46 U/L (ref 0–53)
AST: 50 U/L — ABNORMAL HIGH (ref 0–37)
Albumin: 2.7 g/dL — ABNORMAL LOW (ref 3.5–5.2)
BILIRUBIN TOTAL: 0.4 mg/dL (ref 0.3–1.2)
BUN: 18 mg/dL (ref 6–23)
CO2: 29 meq/L (ref 19–32)
CREATININE: 1.41 mg/dL — AB (ref 0.50–1.35)
Calcium: 8.6 mg/dL (ref 8.4–10.5)
Chloride: 96 mEq/L (ref 96–112)
GFR, EST AFRICAN AMERICAN: 59 mL/min — AB (ref >90)
GFR, EST NON AFRICAN AMERICAN: 51 mL/min — AB (ref >90)
Glucose, Bld: 116 mg/dL — ABNORMAL HIGH (ref 70–99)
POTASSIUM: 4.4 meq/L (ref 3.7–5.3)
Sodium: 136 mEq/L — ABNORMAL LOW (ref 137–147)
Total Protein: 7.6 g/dL (ref 6.0–8.3)

## 2013-09-19 LAB — PRO B NATRIURETIC PEPTIDE: PRO B NATRI PEPTIDE: 3530 pg/mL — AB (ref 0–125)

## 2013-09-19 LAB — I-STAT TROPONIN, ED: Troponin i, poc: 0.01 ng/mL (ref 0.00–0.08)

## 2013-09-19 MED ORDER — FUROSEMIDE 10 MG/ML IJ SOLN
20.0000 mg | Freq: Once | INTRAMUSCULAR | Status: AC
  Administered 2013-09-19: 20 mg via INTRAVENOUS
  Filled 2013-09-19: qty 2

## 2013-09-19 NOTE — ED Notes (Signed)
Pt alert

## 2013-09-19 NOTE — ED Notes (Signed)
Attempted report X1

## 2013-09-19 NOTE — ED Provider Notes (Signed)
CSN: [id number]     Arrival date & time 09/19/13  1918 History   First MD Initiated Contact with Patient 09/19/13 1951     Chief Complaint  Patient presents with  . Shortness of Breath     (Consider location/radiation/quality/duration/timing/severity/associated sxs/prior Treatment) Patient is a 63 y.o. male presenting with shortness of breath. The history is provided by the patient, medical records and a relative.  Shortness of Breath Severity:  Severe Onset quality:  Gradual Timing:  Constant Progression:  Worsening Chronicity:  Recurrent Relieved by:  Nothing Worsened by:  Activity, coughing, movement and exertion Ineffective treatments:  Diuretics, rest and position changes Associated symptoms: PND   Associated symptoms: no abdominal pain, no chest pain, no diaphoresis, no fever, no sputum production, no vomiting and no wheezing     Past Medical History  Diagnosis Date  . Hypertension   . Gout   . Aortic stenosis     echo 06/19/08 with nomal LV function, moderate concentric LVH moderate aortic stenosis area 0.99 cm squared, peak gradient of 50 and mean of 31  . Atrial fibrillation   . Hyperlipidemia   . Atrial fibrillation with RVR, was in atrial fib in 04/2011 09/16/2012  . Tobacco use 09/16/2012  . Heart murmur   . Acute combined systolic and diastolic CHF, NYHA class 3 -- previous normal LV function and moderate AS- re-evaluate; EF now 30-35% with regional [facility] 09/16/2012  . Aortic stenosis, severe 09/23/2012  . CAD (coronary artery disease), single vessel disease 09/23/2012  . Cellulitis, lower extremity- treated 09/23/2012  . PAD (peripheral artery disease), decreased bil. ABIs 09/23/2012  . Gout flare. 09/29/12, Lt knee, improved with colchicine. 09/30/2012  . H/O aortic valve replacement   . Congestive heart failure    Past Surgical History  Procedure Laterality Date  . Iliac artery stent  10/20/08    stent to lt iliac  . Aorto bifem bypass  12/18/08    by Dr. Trula Slade  .  Coronary artery bypass graft N/A 10/08/2012    Procedure: CORONARY ARTERY BYPASS GRAFTING (CABG);  Surgeon: Grace Isaac, MD;  Location: [place];  Service: Open Heart Surgery;  Laterality: N/A;  Coronary Artery bypass Graft times two utilizing the left greater saphenous vein harvested endoscopically  . Aortic valve replacement N/A 10/08/2012    Procedure: AORTIC VALVE REPLACEMENT (AVR);  Surgeon: Grace Isaac, MD;  Location: [place];  Service: Open Heart Surgery;  Laterality: N/A;  . Maze N/A 10/08/2012    Procedure: MAZE;  Surgeon: Grace Isaac, MD;  Location: [place];  Service: Open Heart Surgery;  Laterality: N/A;  . Intraoperative transesophageal echocardiogram N/A 10/08/2012    Procedure: INTRAOPERATIVE TRANSESOPHAGEAL ECHOCARDIOGRAM;  Surgeon: Grace Isaac, MD;  Location: [place];  Service: Open Heart Surgery;  Laterality: N/A;  . Endovein harvest of greater saphenous vein Bilateral 10/08/2012    Procedure: ENDOVEIN HARVEST OF GREATER SAPHENOUS VEIN;  Surgeon: Grace Isaac, MD;  Location: [place];  Service: Open Heart Surgery;  Laterality: Bilateral;  . US echocardiography  06/20/2011    mod concentric LVH,LA severely dilated,RA mildly dilated,mod. ca+ of the mitral apparatus,trace MR,mod. ca+ AOV w/stenosis.  Marland Kitchen Nm myocar perf wall motion  08/25/2008    normal  . Cardioversion N/A 02/25/2013    Procedure: CARDIOVERSION;  Surgeon: Sanda Klein, MD;  Location: [facility];  Service: Cardiovascular;  Laterality: N/A;   Family History  Problem Relation Age of Onset  . Heart attack Father  died at 16 with MI  . Heart disease Brother   . Heart attack Brother   . Heart attack Brother    History  Substance Use Topics  . Smoking status: Former Smoker -- 1.00 packs/day for 43 years    Types: Cigarettes    Quit date: 09/12/2012  . Smokeless tobacco: Never Used     Comment: More than 43 + pack years as he smoked up to 2 1/2 ppd for many years. Had a period of cessation after  femoral stent.  . Alcohol Use: 16.8 oz/week    28 Cans of beer per week     Comment: Glass of wine daily    Review of Systems  Constitutional: Negative for fever and diaphoresis.  Respiratory: Positive for shortness of breath. Negative for sputum production and wheezing.   Cardiovascular: Positive for PND. Negative for chest pain.  Gastrointestinal: Negative for vomiting and abdominal pain.  All other systems reviewed and are negative.     Allergies  Review of patient's allergies indicates no known allergies.  Home Medications   Prior to Admission medications   Medication Sig Start Date End Date Taking? Authorizing Provider  acetaminophen (TYLENOL) 500 MG tablet Take 1,000 mg by mouth every 6 (six) hours as needed for pain.    Historical Provider, MD  amiodarone (PACERONE) 200 MG tablet Take 1 tablet (200 mg total) by mouth 2 (two) times daily. 02/18/13   Lorretta Harp, MD  apixaban (ELIQUIS) 5 MG TABS tablet Take 1 tablet (5 mg total) by mouth 2 (two) times daily. 01/31/13   Tommy Medal, RPH-CPP  carvedilol (COREG) 6.25 MG tablet Take 2 tablets (12.5 mg total) by mouth 2 (two) times daily with a meal. 02/12/13   Tarri Fuller, PA-C  digoxin (LANOXIN) 0.125 MG tablet Take 1 tablet (0.125 mg total) by mouth daily. 11/11/12   Lorretta Harp, MD  furosemide (LASIX) 20 MG tablet Take 1 tablet (20 mg total) by mouth 2 (two) times daily. Or as directed. 02/18/13   Lorretta Harp, MD  oxyCODONE (OXY IR/ROXICODONE) 5 MG immediate release tablet Take 5 mg by mouth every 3 (three) hours as needed for pain.    Historical Provider, MD  potassium chloride SA (K-DUR,KLOR-CON) 20 MEQ tablet Take 1 tablet (20 mEq total) by mouth 2 (two) times daily. 12/05/12   Lorretta Harp, MD  simvastatin (ZOCOR) 20 MG tablet Take 1 tablet (20 mg total) by mouth at bedtime. 11/11/12   Lorretta Harp, MD   BP 103/74  Pulse 109  Resp 28  Ht 5\' 10"  (1.778 m)  Wt 240 lb 5 oz (109.005 kg)  BMI 34.48  kg/m2  SpO2 97% Physical Exam  Nursing note and vitals reviewed. Constitutional: He is oriented to person, place, and time. He appears well-developed and well-nourished.  HENT:  Head: Normocephalic and atraumatic.  Right Ear: External ear normal.  Left Ear: External ear normal.  Eyes: Conjunctivae are normal. Pupils are equal, round, and reactive to light.  Neck: Normal range of motion. Neck supple.  Cardiovascular: Normal rate.  An irregularly irregular rhythm present.  Pulmonary/Chest: He is in respiratory distress (mild). He has no wheezes. He has rales (bibasilar).  Decreased breath sounds  Abdominal: Soft. Bowel sounds are normal. He exhibits distension. There is no tenderness. There is no rebound and no guarding.  Musculoskeletal: He exhibits edema (2+ pitting edema to BLEs).  Neurological: He is alert and oriented to person, place, and time.  Skin: Skin  is warm.  Psychiatric: He has a normal mood and affect.    ED Course  Procedures (including critical care time) Labs Review Labs Reviewed  CBC - Abnormal; Notable for the following:    RBC 4.09 (*)    Hemoglobin 11.2 (*)    HCT 35.4 (*)    RDW 16.9 (*)    All other components within normal limits  PRO B NATRIURETIC PEPTIDE - Abnormal; Notable for the following:    Pro B Natriuretic peptide (BNP) 3530.0 (*)    All other components within normal limits  COMPREHENSIVE METABOLIC PANEL - Abnormal; Notable for the following:    Sodium 136 (*)    Glucose, Bld 116 (*)    Creatinine, Ser 1.41 (*)    Albumin 2.7 (*)    AST 50 (*)    GFR calc non Af Amer 51 (*)    GFR calc Af Amer 59 (*)    All other components within normal limits  PROTIME-INR  BASIC METABOLIC PANEL  DIGOXIN LEVEL  I-STAT TROPOININ, ED    Imaging Review Dg Chest Port 1 View  09/19/2013   CLINICAL DATA:  Worsening shortness of breath, bilateral lower extremity swelling.  EXAM: PORTABLE CHEST - 1 VIEW  COMPARISON:  Chest radiograph November 11, 2012.   FINDINGS: Cardiac silhouette remains mildly enlarged mediastinal silhouette is nonsuspicious, status post median sternotomy. Mildly calcified aortic knob. Mild central pulmonary vasculature congestion and mild interstitial prominence, increased. Strandy densities in left lung base unchanged may reflect scar. No pleural effusions. No pneumothorax.  Multiple EKG lines overlie the patient and may obscure subtle underlying pathology. Soft tissue planes and included osseous structures are nonsuspicious.  IMPRESSION: Stable cardiomegaly with increasing central pulmonary vasculature congestion/ interstitial prominence concerning for pulmonary edema without pleural effusion or focal consolidation.   Electronically Signed   By: Elon Alas   On: 09/19/2013 20:10     EKG Interpretation   Date/Time:  Friday Sep 19 2013 19:38:18 EDT Ventricular Rate:  111 PR Interval:    QRS Duration: 96 QT Interval:  342 QTC Calculation: 465 R Axis:   55 Text Interpretation:  Accelerated Junctional rhythm Septal infarct , age  undetermined Abnormal ECG similar nonspecific lateral ST-T changes to 2014  Confirmed by [place]  MD, [place] ([id number]) on 09/19/2013 7:46:14 PM      MDM   Final diagnoses:  CHF exacerbation    Patient is a 63 y.o. male with past medical history as above and relevant for her severe aortic stenosis status post AV replacement, atrial fibrillation, heart failure, hypertension, and coronary artery disease who presents with shortness of breath. Patient states in the past several months he's been having gradual worsening of his shortness of breath. Patient now is not able to walk more than 2 steps without shortness of breath becoming severe. He is also not able to lay flat. Other symptoms include abdominal distention and lower leg swelling. He was found to be hypoxic on room air and put on 2 L nasal cannula. Other vital signs were unremarkable. Physical exam as above and remarkable for marked  bilateral lower extremity edema, decreased bilateral breath sounds, and abdominal distention. Due to concern for heart failure exacerbation, chest x-ray, EKG, and labs were obtained.  BNP elevated and CXR shows pulmonary vasculature congestion.  IV lasix given and patient to be admitted to cardiology for further management of heart failure.      Corlis Leak, MD 09/20/13 [phone]

## 2013-09-19 NOTE — ED Notes (Signed)
The pt has had some difficulty breathing for 6 months.  Also feet and legs swollen.   Audible wheezes.  No pain cough non-productive.  Alert oriented skin warm and dry.  .  Hr irregular af.  He had a cabg with an aortic valve replaced last June and had a cardioversion for af in nov 2014

## 2013-09-19 NOTE — H&P (Addendum)
Patient ID: [name] MRN: [id number], DOB/AGE: Aug 26, 1950   Admit date: 09/19/2013   Primary Physician: No PCP Per Patient Primary Cardiologist: Dr. Val Sparks  Pt. Profile:  86M AS s/p AVR/CABG(SVG to PDA and PL)/MAZE(10/08/12), pAF, COPD who is admitted with volume overload.  Problem List  Past Medical History  Diagnosis Date  . Hypertension   . Gout   . Aortic stenosis     echo 06/19/08 with nomal LV function, moderate concentric LVH moderate aortic stenosis area 0.99 cm squared, peak gradient of 50 and mean of 31  . Atrial fibrillation   . Hyperlipidemia   . Atrial fibrillation with RVR, was in atrial fib in 04/2011 09/16/2012  . Tobacco use 09/16/2012  . Heart murmur   . Acute combined systolic and diastolic CHF, NYHA class 3 -- previous normal LV function and moderate AS- re-evaluate; EF now 30-35% with regional [facility] 09/16/2012  . Aortic stenosis, severe 09/23/2012  . CAD (coronary artery disease), single vessel disease 09/23/2012  . Cellulitis, lower extremity- treated 09/23/2012  . PAD (peripheral artery disease), decreased bil. ABIs 09/23/2012  . Gout flare. 09/29/12, Lt knee, improved with colchicine. 09/30/2012  . H/O aortic valve replacement   . Congestive heart failure     Past Surgical History  Procedure Laterality Date  . Iliac artery stent  10/20/08    stent to lt iliac  . Aorto bifem bypass  12/18/08    by Dr. Trula Sparks  . Coronary artery bypass graft N/A 10/08/2012    Procedure: CORONARY ARTERY BYPASS GRAFTING (CABG);  Surgeon: Dustin Isaac, MD;  Location: [place];  Service: Open Heart Surgery;  Laterality: N/A;  Coronary Artery bypass Graft times two utilizing the left greater saphenous vein harvested endoscopically  . Aortic valve replacement N/A 10/08/2012    Procedure: AORTIC VALVE REPLACEMENT (AVR);  Surgeon: Dustin Isaac, MD;  Location: [place];  Service: Open Heart Surgery;  Laterality: N/A;  . Maze N/A 10/08/2012    Procedure: MAZE;  Surgeon: Dustin Isaac, MD;  Location: [place];  Service: Open Heart Surgery;  Laterality: N/A;  . Intraoperative transesophageal echocardiogram N/A 10/08/2012    Procedure: INTRAOPERATIVE TRANSESOPHAGEAL ECHOCARDIOGRAM;  Surgeon: Dustin Isaac, MD;  Location: [place];  Service: Open Heart Surgery;  Laterality: N/A;  . Endovein harvest of greater saphenous vein Bilateral 10/08/2012    Procedure: ENDOVEIN HARVEST OF GREATER SAPHENOUS VEIN;  Surgeon: Dustin Isaac, MD;  Location: [place];  Service: Open Heart Surgery;  Laterality: Bilateral;  . US echocardiography  06/20/2011    mod concentric LVH,LA severely dilated,RA mildly dilated,mod. ca+ of the mitral apparatus,trace MR,mod. ca+ AOV w/stenosis.  Marland Kitchen Nm myocar perf wall motion  08/25/2008    normal  . Cardioversion N/A 02/25/2013    Procedure: CARDIOVERSION;  Surgeon: Dustin Klein, MD;  Location: [facility];  Service: Cardiovascular;  Laterality: N/A;     Allergies  No Known Allergies  HPI  86M AS s/p AVR/CABG(SVG to PDA and PL), MAZE(10/08/12), pAF, COPD who is admitted with volume overload.  [name] reports he has gained 60lbs over the past 6 months. He endorsees severe orthopnea that has required him to sleep in a chair for the past 3-4 months. He notes abdominal distention and LE edema with areas of weeping. He ambulates with a walker and can only move a few steps until he must take a break due to DOE. No chest pain, palpitations, or cough. Denies dietary indiscretion or medication  non-adherence. He does note that due to insurance issues, he has not taken oral anticoagulation (apixiban) since 03/06/13. He has not had issues tolerating AC.    The patient's daughter came to town and saw her father in this condition and insisted he come to the hospital. In the ED, [name] was noted to be hemodynamically stable, but saturating 87% on RA. He was given IV lasix and admitted to cardiology.   Patient and daughter ask that we address long standing  lower back pain during this admission as it is a major limiting factor for ambulation.   Home Medications  Prior to Admission medications   Medication Sig Start Date End Date Taking? Authorizing Provider  amiodarone (PACERONE) 200 MG tablet Take 1 tablet (200 mg total) by mouth 2 (two) times daily. 02/18/13  Yes Lorretta Harp, MD  carvedilol (COREG) 6.25 MG tablet Take 2 tablets (12.5 mg total) by mouth 2 (two) times daily with a meal. 02/12/13  Yes Tarri Fuller, PA-C  digoxin (LANOXIN) 0.125 MG tablet Take 1 tablet (0.125 mg total) by mouth daily. 11/11/12  Yes Lorretta Harp, MD  furosemide (LASIX) 20 MG tablet Take 1 tablet (20 mg total) by mouth 2 (two) times daily. Or as directed. 02/18/13  Yes Lorretta Harp, MD  potassium chloride SA (K-DUR,KLOR-CON) 20 MEQ tablet Take 1 tablet (20 mEq total) by mouth 2 (two) times daily. 12/05/12  Yes Lorretta Harp, MD  simvastatin (ZOCOR) 20 MG tablet Take 1 tablet (20 mg total) by mouth at bedtime. 11/11/12  Yes Lorretta Harp, MD    Family History  Family History  Problem Relation Age of Onset  . Heart attack Father     died at 42 with MI  . Heart disease Brother   . Heart attack Brother   . Heart attack Brother     Social History  History   Social History  . Marital Status: Divorced    Spouse Name: N/A    Number of Children: N/A  . Years of Education: N/A   Occupational History  . retired    Social History Main Topics  . Smoking status: Former Smoker -- 1.00 packs/day for 43 years    Types: Cigarettes    Quit date: 09/12/2012  . Smokeless tobacco: Never Used     Comment: More than 43 + pack years as he smoked up to 2 1/2 ppd for many years. Had a period of cessation after femoral stent.  . Alcohol Use: 16.8 oz/week    28 Cans of beer per week     Comment: Glass of wine daily  . Drug Use: No  . Sexual Activity: Not on file   Other Topics Concern  . Not on file   Social History Narrative  . No narrative on file       Review of Systems General:  No chills, fever, night sweats. 60# weight gain over 6 months  Cardiovascular:  see HPI Dermatological: weeping LE Respiratory: No cough, + dyspnea Urologic: No hematuria, dysuria Abdominal:   No nausea, vomiting, diarrhea, bright red blood per rectum, melena, or hematemesis Neurologic:  No visual changes, wkns, changes in mental status. All other systems reviewed and are otherwise negative except as noted above.  Physical Exam  Blood pressure 126/68, pulse 107, resp. rate 26, height 5\' 10"  (1.778 m), weight 109.005 kg (240 lb 5 oz), SpO2 98.00%.  General: Pleasant, NAD Psych: Normal affect. Neuro: Alert and oriented X 3. Moves all  extremities spontaneously. HEENT: Normal  Neck: +JVD. Lungs:  Mild respiratory distress Heart: RRR no s3, s4, or murmurs. Abdomen: Soft, non-tender, non-distended, BS + x 4.  Extremities: No clubbing, cyanosis . 3-4+ tense LE edema. Venous stasis changes and weeping from small leg ulcerations noted. Radials 2+ and equal bilaterally. DP/PT unable to be palpated, likely related to extensive edema.   Labs  Troponin [facility] Test)  Recent Labs  09/19/13 2015  TROPIPOC 0.01   No results found for this basename: CKTOTAL, CKMB, TROPONINI,  in the last 72 hours Lab Results  Component Value Date   WBC 4.8 09/19/2013   HGB 11.2* 09/19/2013   HCT 35.4* 09/19/2013   MCV 86.6 09/19/2013   PLT 178 09/19/2013    Recent Labs Lab 09/19/13 2009  NA 136*  K 4.4  CL 96  CO2 29  BUN 18  CREATININE 1.41*  CALCIUM 8.6  PROT 7.6  BILITOT 0.4  ALKPHOS 65  ALT 46  AST 50*  GLUCOSE 116*   Lab Results  Component Value Date   CHOL 145 09/17/2012   HDL 47 09/17/2012   LDLCALC 83 09/17/2012   TRIG 77 09/17/2012   No results found for this basename: DDIMER     Radiology/Studies  Dg Chest Port 1 View  09/19/2013   CLINICAL DATA:  Worsening shortness of breath, bilateral lower extremity swelling.  EXAM: PORTABLE CHEST -  1 VIEW  COMPARISON:  Chest radiograph November 11, 2012.  FINDINGS: Cardiac silhouette remains mildly enlarged mediastinal silhouette is nonsuspicious, status post median sternotomy. Mildly calcified aortic knob. Mild central pulmonary vasculature congestion and mild interstitial prominence, increased. Strandy densities in left lung base unchanged may reflect scar. No pleural effusions. No pneumothorax.  Multiple EKG lines overlie the patient and may obscure subtle underlying pathology. Soft tissue planes and included osseous structures are nonsuspicious.  IMPRESSION: Stable cardiomegaly with increasing central pulmonary vasculature congestion/ interstitial prominence concerning for pulmonary edema without pleural effusion or focal consolidation.   Electronically Signed   By: Elon Alas   On: 09/19/2013 20:10   11/11/12 TTE - Left ventricle: The cavity size was normal. Systolic function was normal. The estimated ejection fraction was in the range of 55% to 60%. Features are consistent with a pseudonormal left ventricular filling pattern, with concomitant abnormal relaxation and increased filling pressure (grade 2 diastolic dysfunction). - Ventricular septum: Septal motion showed paradox. - Aortic valve: A bioprosthesis was present and functioning normally. Trivial regurgitation. Valve area: 1.43cm^2(VTI). Valve area: 1.27cm^2 (Vmax). - Mitral valve: Calcified annulus. Mild regurgitation. Valve area by pressure half-time: 2.37cm^2. Valve area by continuity equation (using LVOT flow): 1.69cm^2. - Left atrium: The atrium was severely dilated. - Right atrium: The atrium was severely dilated.  ECG Possible atypical AFL with 1mm STD in V5/V5, old ASMI, and NSSTTWC.  Compare to prior on 02/26/13, ST depressions in V5/6 are more pronounced  ASSESSMENT AND PLAN  59M AS s/p AVR/CABG(SVG to PDA and PL)/MAZE(10/08/12), pAF, COPD who is admitted with volume overload. He also appears to have some sort of  an atrial tachyarrhythmia (atypical flutter?). Unclear if HF is driving arrhyhtmia or vise versa.   Acute on chronic diastolic HF - lasix 40mg  IV BID - TTE  AF/AF. CHADSVASC = 2 - continue amiodarone, coreg, digoxin - start UFH gtt - consider TEE/DCCV  Lower back pain - lumbar spine films  LE wounds - wound care consult  Other - PT consult  Signed, Lamar Sprinkles, MD 09/19/2013, 10:16  PM

## 2013-09-19 NOTE — ED Notes (Signed)
PResents with increased WOB , abdominal and bilateral leg swelling, rhales bilaterally, pt is using accessory muscles, small a,ount of exertion increases work of breathing. 87% on RA. Pt reports this bgean 6 months ago-hx CHF

## 2013-09-20 ENCOUNTER — Inpatient Hospital Stay (HOSPITAL_COMMUNITY): Payer: Medicaid Other

## 2013-09-20 ENCOUNTER — Encounter (HOSPITAL_COMMUNITY): Payer: Self-pay | Admitting: *Deleted

## 2013-09-20 DIAGNOSIS — I369 Nonrheumatic tricuspid valve disorder, unspecified: Secondary | ICD-10-CM

## 2013-09-20 LAB — BASIC METABOLIC PANEL
BUN: 18 mg/dL (ref 6–23)
CALCIUM: 8.7 mg/dL (ref 8.4–10.5)
CO2: 32 mEq/L (ref 19–32)
Chloride: 98 mEq/L (ref 96–112)
Creatinine, Ser: 1.41 mg/dL — ABNORMAL HIGH (ref 0.50–1.35)
GFR, EST AFRICAN AMERICAN: 59 mL/min — AB (ref >90)
GFR, EST NON AFRICAN AMERICAN: 51 mL/min — AB (ref >90)
Glucose, Bld: 108 mg/dL — ABNORMAL HIGH (ref 70–99)
POTASSIUM: 4.2 meq/L (ref 3.7–5.3)
SODIUM: 139 meq/L (ref 137–147)

## 2013-09-20 LAB — CBC
HCT: 37 % — ABNORMAL LOW (ref 39.0–52.0)
HEMOGLOBIN: 10.8 g/dL — AB (ref 13.0–17.0)
MCH: 26.2 pg (ref 26.0–34.0)
MCHC: 29.2 g/dL — AB (ref 30.0–36.0)
MCV: 89.8 fL (ref 78.0–100.0)
PLATELETS: 185 10*3/uL (ref 150–400)
RBC: 4.12 MIL/uL — ABNORMAL LOW (ref 4.22–5.81)
RDW: 17.1 % — AB (ref 11.5–15.5)
WBC: 4.6 10*3/uL (ref 4.0–10.5)

## 2013-09-20 LAB — HEPARIN LEVEL (UNFRACTIONATED)
HEPARIN UNFRACTIONATED: 0.35 [IU]/mL (ref 0.30–0.70)
Heparin Unfractionated: 0.43 IU/mL (ref 0.30–0.70)

## 2013-09-20 LAB — DIGOXIN LEVEL: DIGOXIN LVL: 0.8 ng/mL (ref 0.8–2.0)

## 2013-09-20 MED ORDER — CARVEDILOL 12.5 MG PO TABS
12.5000 mg | ORAL_TABLET | Freq: Two times a day (BID) | ORAL | Status: DC
  Administered 2013-09-20 – 2013-09-22 (×6): 12.5 mg via ORAL
  Filled 2013-09-20 (×9): qty 1

## 2013-09-20 MED ORDER — COLCHICINE 0.6 MG PO TABS
0.6000 mg | ORAL_TABLET | Freq: Once | ORAL | Status: AC
  Administered 2013-09-20: 0.6 mg via ORAL
  Filled 2013-09-20: qty 1

## 2013-09-20 MED ORDER — ASPIRIN EC 81 MG PO TBEC
81.0000 mg | DELAYED_RELEASE_TABLET | Freq: Every day | ORAL | Status: DC
  Administered 2013-09-20 – 2013-09-23 (×4): 81 mg via ORAL
  Filled 2013-09-20 (×5): qty 1

## 2013-09-20 MED ORDER — SIMVASTATIN 20 MG PO TABS
20.0000 mg | ORAL_TABLET | Freq: Every day | ORAL | Status: DC
  Administered 2013-09-20 – 2013-09-22 (×3): 20 mg via ORAL
  Filled 2013-09-20 (×5): qty 1

## 2013-09-20 MED ORDER — SODIUM CHLORIDE 0.9 % IJ SOLN
3.0000 mL | Freq: Two times a day (BID) | INTRAMUSCULAR | Status: DC
  Administered 2013-09-20 – 2013-09-30 (×15): 3 mL via INTRAVENOUS

## 2013-09-20 MED ORDER — DIGOXIN 125 MCG PO TABS
0.1250 mg | ORAL_TABLET | Freq: Every day | ORAL | Status: DC
  Administered 2013-09-20 – 2013-09-23 (×4): 0.125 mg via ORAL
  Filled 2013-09-20 (×5): qty 1

## 2013-09-20 MED ORDER — HEPARIN (PORCINE) IN NACL 100-0.45 UNIT/ML-% IJ SOLN
1300.0000 [IU]/h | INTRAMUSCULAR | Status: DC
  Administered 2013-09-20: 1300 [IU]/h via INTRAVENOUS
  Filled 2013-09-20 (×2): qty 250

## 2013-09-20 MED ORDER — SODIUM CHLORIDE 0.9 % IV SOLN
250.0000 mL | INTRAVENOUS | Status: DC | PRN
  Administered 2013-09-23: 250 mL via INTRAVENOUS

## 2013-09-20 MED ORDER — AMIODARONE HCL 200 MG PO TABS
200.0000 mg | ORAL_TABLET | Freq: Two times a day (BID) | ORAL | Status: DC
  Administered 2013-09-20 – 2013-09-30 (×21): 200 mg via ORAL
  Filled 2013-09-20 (×23): qty 1

## 2013-09-20 MED ORDER — HEPARIN (PORCINE) IN NACL 100-0.45 UNIT/ML-% IJ SOLN
1400.0000 [IU]/h | INTRAMUSCULAR | Status: AC
  Administered 2013-09-20 – 2013-09-23 (×5): 1400 [IU]/h via INTRAVENOUS
  Filled 2013-09-20 (×5): qty 250

## 2013-09-20 MED ORDER — ACETAMINOPHEN 325 MG PO TABS
650.0000 mg | ORAL_TABLET | Freq: Four times a day (QID) | ORAL | Status: DC | PRN
  Administered 2013-09-20 – 2013-09-22 (×5): 650 mg via ORAL
  Filled 2013-09-20 (×5): qty 2

## 2013-09-20 MED ORDER — HEPARIN BOLUS VIA INFUSION
4500.0000 [IU] | Freq: Once | INTRAVENOUS | Status: AC
  Administered 2013-09-20: 4500 [IU] via INTRAVENOUS
  Filled 2013-09-20: qty 4500

## 2013-09-20 MED ORDER — POTASSIUM CHLORIDE CRYS ER 20 MEQ PO TBCR
20.0000 meq | EXTENDED_RELEASE_TABLET | Freq: Two times a day (BID) | ORAL | Status: DC
  Administered 2013-09-20 – 2013-09-23 (×8): 20 meq via ORAL
  Filled 2013-09-20 (×11): qty 1

## 2013-09-20 MED ORDER — PNEUMOCOCCAL VAC POLYVALENT 25 MCG/0.5ML IJ INJ
0.5000 mL | INJECTION | INTRAMUSCULAR | Status: AC
  Administered 2013-09-23: 0.5 mL via INTRAMUSCULAR
  Filled 2013-09-20: qty 0.5

## 2013-09-20 MED ORDER — SODIUM CHLORIDE 0.9 % IJ SOLN
3.0000 mL | INTRAMUSCULAR | Status: DC | PRN
  Administered 2013-09-25 – 2013-09-29 (×2): 3 mL via INTRAVENOUS

## 2013-09-20 MED ORDER — COLCHICINE 0.6 MG PO TABS
0.6000 mg | ORAL_TABLET | Freq: Once | ORAL | Status: AC
  Administered 2013-09-20: 0.6 mg via ORAL
  Filled 2013-09-20 (×2): qty 1

## 2013-09-20 MED ORDER — FUROSEMIDE 10 MG/ML IJ SOLN
40.0000 mg | Freq: Two times a day (BID) | INTRAMUSCULAR | Status: DC
  Administered 2013-09-20 – 2013-09-21 (×3): 40 mg via INTRAVENOUS
  Filled 2013-09-20 (×4): qty 4

## 2013-09-20 NOTE — Progress Notes (Signed)
ANTICOAGULATION CONSULT NOTE -Follow up  Pharmacy Consult for Heparin  Indication: atrial fibrillation  No Known Allergies  Patient Measurements: Height: 5\' 10"  (177.8 cm) Weight: 231 lb 7.7 oz (105 kg) IBW/kg (Calculated) : 73  Vital Signs: Temp: 99.1 F (37.3 C) (05/30 1500) Temp src: Oral (05/30 1500) BP: 126/79 mmHg (05/30 1500) Pulse Rate: 102 (05/30 1500)  Labs:  Recent Labs  09/19/13 1941 09/19/13 2009 09/20/13 0054 09/20/13 0743 09/20/13 1550  HGB 11.2*  --   --   --  10.8*  HCT 35.4*  --   --   --  37.0*  PLT 178  --   --   --  185  LABPROT 13.4  --   --   --   --   INR 1.04  --   --   --   --   HEPARINUNFRC  --   --   --  0.35 0.43  CREATININE  --  1.41* 1.41*  --   --     Estimated Creatinine Clearance: 64.2 ml/min (by C-G formula based on Cr of 1.41).   Assessment: 64 y.o. male on heparin for afib. Heparin level therapeutic on 1400 units/hr. No bleeding noted. CBC stable.  Goal of Therapy:  Heparin level 0.3-0.7 units/ml Monitor platelets by anticoagulation protocol: Yes   Plan:  -Continue heparin gtt at 1400 units/hr -Daily CBC/HL -Monitor for bleeding  Sherlon Handing, PharmD, BCPS Clinical pharmacist, pager [phone] 09/20/2013,5:10 PM

## 2013-09-20 NOTE — Progress Notes (Signed)
Patient ID: [name], male   DOB: June 30, 1950, 63 y.o.   MRN: [id number]   Patient Name: [name] Date of Encounter: 09/20/2013     Active Problems:   Acute decompensated heart failure    SUBJECTIVE  Dyspnea still present but somewhat better. No chest pain or sob.  CURRENT MEDS . amiodarone  200 mg Oral BID  . aspirin EC  81 mg Oral Daily  . carvedilol  12.5 mg Oral BID WC  . digoxin  0.125 mg Oral Daily  . furosemide  40 mg Intravenous BID  . [START ON 09/21/2013] pneumococcal 23 valent vaccine  0.5 mL Intramuscular Tomorrow-1000  . potassium chloride SA  20 mEq Oral BID  . simvastatin  20 mg Oral QHS  . sodium chloride  3 mL Intravenous Q12H    OBJECTIVE  Filed Vitals:   09/19/13 2300 09/20/13 0008 09/20/13 0009 09/20/13 0455  BP: 118/56 98/51 127/68 105/64  Pulse: 109 101  87  Temp:  99.3 F (37.4 C)  99.1 F (37.3 C)  TempSrc:  Oral  Oral  Resp: 29 22  21   Height:      Weight:  231 lb 7.7 oz (105 kg)    SpO2: 97% 94%  95%    Intake/Output Summary (Last 24 hours) at 09/20/13 1247 Last data filed at 09/20/13 1045  Gross per 24 hour  Intake 300.75 ml  Output   1100 ml  Net -799.25 ml   Filed Weights   09/19/13 1948 09/20/13 0008  Weight: 240 lb 5 oz (109.005 kg) 231 lb 7.7 oz (105 kg)    PHYSICAL EXAM  General: Pleasant, NAD. Neuro: Alert and oriented X 3. Moves all extremities spontaneously. Psych: Normal affect. HEENT:  Normal  Neck: Supple without bruits or JVD. Lungs:  Resp regular and unlabored, CTA. Heart: Ireg tachy no s3, s4, or murmurs. Abdomen: Soft, non-tender, non-distended, BS + x 4.  Extremities: No clubbing, cyanosis or edema. DP/PT/Radials 2+ and equal bilaterally.  Accessory Clinical Findings  CBC  Recent Labs  09/19/13 1941  WBC 4.8  HGB 11.2*  HCT 35.4*  MCV 86.6  PLT 992   Basic Metabolic Panel  Recent Labs  09/19/13 2009 09/20/13 0054  NA 136* 139  K 4.4 4.2  CL 96 98  CO2 29 32  GLUCOSE 116*  108*  BUN 18 18  CREATININE 1.41* 1.41*  CALCIUM 8.6 8.7   Liver Function Tests  Recent Labs  09/19/13 2009  AST 50*  ALT 46  ALKPHOS 65  BILITOT 0.4  PROT 7.6  ALBUMIN 2.7*   No results found for this basename: LIPASE, AMYLASE,  in the last 72 hours Cardiac Enzymes No results found for this basename: CKTOTAL, CKMB, CKMBINDEX, TROPONINI,  in the last 72 hours BNP No components found with this basename: POCBNP,  D-Dimer No results found for this basename: DDIMER,  in the last 72 hours Hemoglobin A1C No results found for this basename: HGBA1C,  in the last 72 hours Fasting Lipid Panel No results found for this basename: CHOL, HDL, LDLCALC, TRIG, CHOLHDL, LDLDIRECT,  in the last 72 hours Thyroid Function Tests No results found for this basename: TSH, T4TOTAL, FREET3, T3FREE, THYROIDAB,  in the last 72 hours  TELE  Atrial fib with an RVR and atrial flutter with an RVR   Radiology/Studies  Dg Chest Port 1 View  09/19/2013   CLINICAL DATA:  Worsening shortness of breath, bilateral lower extremity swelling.  EXAM: PORTABLE CHEST -  1 VIEW  COMPARISON:  Chest radiograph November 11, 2012.  FINDINGS: Cardiac silhouette remains mildly enlarged mediastinal silhouette is nonsuspicious, status post median sternotomy. Mildly calcified aortic knob. Mild central pulmonary vasculature congestion and mild interstitial prominence, increased. Strandy densities in left lung base unchanged may reflect scar. No pleural effusions. No pneumothorax.  Multiple EKG lines overlie the patient and may obscure subtle underlying pathology. Soft tissue planes and included osseous structures are nonsuspicious.  IMPRESSION: Stable cardiomegaly with increasing central pulmonary vasculature congestion/ interstitial prominence concerning for pulmonary edema without pleural effusion or focal consolidation.   Electronically Signed   By: Elon Alas   On: 09/19/2013 20:10    ASSESSMENT AND PLAN 1. Acute on chronic  systolic and diastolic heart failure 2. Atrial fib/flutter with a RVR 3. Obesity 4. COPD Rec: will continue IV lasix, and work on rate control. Check 2D echo.  Rodgers Likes,M.D.   Muhamed Luecke,M.D.  09/20/2013 12:47 PM

## 2013-09-20 NOTE — Progress Notes (Signed)
ANTICOAGULATION CONSULT NOTE -Follow up Pharmacy Consult for Heparin  Indication: atrial fibrillation  No Known Allergies  Patient Measurements: Height: 5\' 10"  (177.8 cm) Weight: 231 lb 7.7 oz (105 kg) IBW/kg (Calculated) : 73  Vital Signs: Temp: 99.1 F (37.3 C) (05/30 0455) Temp src: Oral (05/30 0455) BP: 105/64 mmHg (05/30 0455) Pulse Rate: 87 (05/30 0455)  Labs:  Recent Labs  09/19/13 1941 09/19/13 2009 09/20/13 0054 09/20/13 0743  HGB 11.2*  --   --   --   HCT 35.4*  --   --   --   PLT 178  --   --   --   LABPROT 13.4  --   --   --   INR 1.04  --   --   --   HEPARINUNFRC  --   --   --  0.35  CREATININE  --  1.41* 1.41*  --     Estimated Creatinine Clearance: 64.2 ml/min (by C-G formula based on Cr of 1.41).   Medical History: Past Medical History  Diagnosis Date  . Hypertension   . Gout   . Aortic stenosis     echo 06/19/08 with nomal LV function, moderate concentric LVH moderate aortic stenosis area 0.99 cm squared, peak gradient of 50 and mean of 31  . Atrial fibrillation   . Hyperlipidemia   . Atrial fibrillation with RVR, was in atrial fib in 04/2011 09/16/2012  . Tobacco use 09/16/2012  . Heart murmur   . Acute combined systolic and diastolic CHF, NYHA class 3 -- previous normal LV function and moderate AS- re-evaluate; EF now 30-35% with regional [facility] 09/16/2012  . Aortic stenosis, severe 09/23/2012  . CAD (coronary artery disease), single vessel disease 09/23/2012  . Cellulitis, lower extremity- treated 09/23/2012  . PAD (peripheral artery disease), decreased bil. ABIs 09/23/2012  . Gout flare. 09/29/12, Lt knee, improved with colchicine. 09/30/2012  . H/O aortic valve replacement   . Congestive heart failure     Medications:  No AC PTA  Assessment: 63 y/o M on heparin infusion for afib. Therapeutic heparin level this AM = 0.35 on heparin rate 1300 units/hr. No bleeding noted.   Goal of Therapy:  Heparin level 0.3-0.7 units/ml Monitor platelets by  anticoagulation protocol: Yes   Plan:  Increase heparin drip to 1400 units/hr Recheck  HL at 16:00 to confirm remains therapeutic then daily CBC/HL -Monitor for bleeding  Nicole Cella, RPh Clinical Pharmacist Pager: [phone]  09/20/2013,9:53 AM

## 2013-09-20 NOTE — Progress Notes (Signed)
ANTICOAGULATION CONSULT NOTE - Initial Consult  Pharmacy Consult for Heparin  Indication: atrial fibrillation  No Known Allergies  Patient Measurements: Height: 5\' 10"  (177.8 cm) Weight: 231 lb 7.7 oz (105 kg) IBW/kg (Calculated) : 73  Vital Signs: Temp: 99.3 F (37.4 C) (05/30 0008) Temp src: Oral (05/30 0008) BP: 127/68 mmHg (05/30 0009) Pulse Rate: 101 (05/30 0008)  Labs:  Recent Labs  09/19/13 1941 09/19/13 2009  HGB 11.2*  --   HCT 35.4*  --   PLT 178  --   LABPROT 13.4  --   INR 1.04  --   CREATININE  --  1.41*    Estimated Creatinine Clearance: 64.2 ml/min (by C-G formula based on Cr of 1.41).   Medical History: Past Medical History  Diagnosis Date  . Hypertension   . Gout   . Aortic stenosis     echo 06/19/08 with nomal LV function, moderate concentric LVH moderate aortic stenosis area 0.99 cm squared, peak gradient of 50 and mean of 31  . Atrial fibrillation   . Hyperlipidemia   . Atrial fibrillation with RVR, was in atrial fib in 04/2011 09/16/2012  . Tobacco use 09/16/2012  . Heart murmur   . Acute combined systolic and diastolic CHF, NYHA class 3 -- previous normal LV function and moderate AS- re-evaluate; EF now 30-35% with regional [facility] 09/16/2012  . Aortic stenosis, severe 09/23/2012  . CAD (coronary artery disease), single vessel disease 09/23/2012  . Cellulitis, lower extremity- treated 09/23/2012  . PAD (peripheral artery disease), decreased bil. ABIs 09/23/2012  . Gout flare. 09/29/12, Lt knee, improved with colchicine. 09/30/2012  . H/O aortic valve replacement   . Congestive heart failure     Medications:  No AC PTA  Assessment: 63 y/o M to start heparin for afib. Hgb 11.2, Scr 1.41, INR wnl, other labs as above.   Goal of Therapy:  Heparin level 0.3-0.7 units/ml Monitor platelets by anticoagulation protocol: Yes   Plan:  -Heparin 4500 units BOLUS -Start heparin drip at 1300 units/hr -0800 HL -Daily CBC/HL -Monitor for bleeding  Narda Bonds 09/20/2013,12:19 AM

## 2013-09-20 NOTE — Evaluation (Signed)
Physical Therapy Evaluation Patient Details Name: [name] MRN: [id number] DOB: [birth year] Today's Date: 09/20/2013   History of Present Illness  Pt admit with CHF and afib.    Clinical Impression  Pt admitted with above. Pt currently with functional limitations due to the deficits listed below (see PT Problem List). Pt has low back pain of which pt states limits him from walking and he states that his daughter wants an MRI to be done while he is here because this has been an issue for some time now.  Feel that pt can return home alone but will probably continue to come back to hospital if lumbar pain is not addressed.  He refuses HHPT and Cardiac rehab because he says he can't tolerate it.  He is safe with RW with mobility.  Currently pt desats without O2 and is low on RA at rest as well.  May need home O2.     Pt will benefit from skilled PT to increase their independence and safety with mobility to allow discharge to the venue listed below.     Follow Up Recommendations No PT follow up;Supervision - Intermittent    Equipment Recommendations  Other (comment) (may need home O2)    Recommendations for Other Services       Precautions / Restrictions Precautions Precautions: Fall Restrictions Weight Bearing Restrictions: No      Mobility  Bed Mobility Overal bed mobility: Independent                Transfers Overall transfer level: Independent Equipment used: Rolling walker (2 wheeled)                Ambulation/Gait Ambulation/Gait assistance: Min guard Ambulation Distance (Feet): 100 Feet Assistive device: Rolling walker (2 wheeled) Gait Pattern/deviations: Step-through pattern;Decreased stride length;Wide base of support   Gait velocity interpretation: Below normal speed for age/gender General Gait Details: Pt ambulated with RW with good stability overall.  Pt states he is close to baseline however he states his back limits his progression of mobility.   He states that he can't walk far enough for cardiac rehab because his back hurts too bad but would really benefit from endurance training.    Stairs            Wheelchair Mobility    Modified Rankin (Stroke Patients Only)       Balance Overall balance assessment: Needs assistance         Standing balance support: Bilateral upper extremity supported;During functional activity Standing balance-Leahy Scale: Fair Standing balance comment: Can stand without UE support but cannot accept challenges to balance without device.                               Pertinent Vitals/Pain O2 at rest on RA 90%.  O2 with activity on RA is 83-87%.  O2 on 2L O2 is 92%.  Low back pain reported but not rated.      Home Living Family/patient expects to be discharged to:: Private residence Living Arrangements: Alone Available Help at Discharge: Family;Available PRN/intermittently Type of Home: Apartment Home Access: Level entry     Home Layout: One level Home Equipment: Walker - 2 wheels      Prior Function Level of Independence: Independent with assistive device(s)               Hand Dominance   Dominant Hand: Right    Extremity/Trunk Assessment  Upper Extremity Assessment: Defer to OT evaluation           Lower Extremity Assessment: Generalized weakness      Cervical / Trunk Assessment: Normal  Communication   Communication: No difficulties  Cognition Arousal/Alertness: Awake/alert Behavior During Therapy: WFL for tasks assessed/performed Overall Cognitive Status: Within Functional Limits for tasks assessed                      General Comments      Exercises General Exercises - Lower Extremity Ankle Circles/Pumps: AROM;Both;10 reps;Seated Long Arc Quad: AROM;Both;5 reps;Seated      Assessment/Plan    PT Assessment Patient needs continued PT services  PT Diagnosis Generalized weakness   PT Problem List Decreased activity  tolerance;Decreased balance;Decreased mobility;Decreased knowledge of use of DME;Decreased safety awareness;Decreased knowledge of precautions;Pain  PT Treatment Interventions DME instruction;Gait training;Functional mobility training;Therapeutic activities;Therapeutic exercise;Balance training;Patient/family education   PT Goals (Current goals can be found in the Care Plan section) Acute Rehab PT Goals Patient Stated Goal: to go home PT Goal Formulation: With patient Time For Goal Achievement: 09/27/13 Potential to Achieve Goals: Good    Frequency Min 3X/week   Barriers to discharge        Co-evaluation               End of Session Equipment Utilized During Treatment: Gait belt;Oxygen Activity Tolerance: Patient limited by fatigue;Patient limited by pain Patient left: in chair;with call bell/phone within reach Nurse Communication: Mobility status         Time: 0930-0950 PT Time Calculation (min): 20 min   Charges:   PT Evaluation $Initial PT Evaluation Tier I: 1 Procedure PT Treatments $Gait Training: 8-22 mins   PT G Codes:          Katryn Plummer Dorene Ar October 15, 2013, 11:24 AM [name] [phone] (pager)

## 2013-09-20 NOTE — Progress Notes (Signed)
Echocardiogram 2D Echocardiogram has been performed.  [name] [name] 09/20/2013, 2:15 PM

## 2013-09-20 NOTE — ED Provider Notes (Signed)
I saw and evaluated the patient, reviewed the resident's note and I agree with the findings and plan.   EKG Interpretation   Date/Time:  Friday Sep 19 2013 19:38:18 EDT Ventricular Rate:  111 PR Interval:    QRS Duration: 96 QT Interval:  342 QTC Calculation: 465 R Axis:   55 Text Interpretation:  Accelerated Junctional rhythm Septal infarct , age  undetermined Abnormal ECG similar nonspecific lateral ST-T changes to 2014  Confirmed by Kylyn Sookram  MD, [place] ([id number]) on 09/19/2013 7:46:14 PM       Patient with signs of CHF exacerbation. Admit  Ephraim Hamburger, MD 09/20/13 [phone]

## 2013-09-20 NOTE — Progress Notes (Signed)
SATURATION QUALIFICATIONS: (This note is used to comply with regulatory documentation for home oxygen)  Patient Saturations on Room Air at Rest = 90%  Patient Saturations on Room Air while Ambulating = 83-87%  Patient Saturations on 2 Liters of oxygen while Ambulating = 92%  Please briefly explain why patient needs home oxygen:Pt desats on RA with activity.  O2 on 2L raises O2 to >90%.  May need home O2. Thanks. [facility] [phone] (pager)

## 2013-09-21 LAB — BASIC METABOLIC PANEL
BUN: 24 mg/dL — ABNORMAL HIGH (ref 6–23)
CALCIUM: 8.7 mg/dL (ref 8.4–10.5)
CO2: 36 mEq/L — ABNORMAL HIGH (ref 19–32)
Chloride: 95 mEq/L — ABNORMAL LOW (ref 96–112)
Creatinine, Ser: 1.61 mg/dL — ABNORMAL HIGH (ref 0.50–1.35)
GFR, EST AFRICAN AMERICAN: 51 mL/min — AB (ref >90)
GFR, EST NON AFRICAN AMERICAN: 44 mL/min — AB (ref >90)
Glucose, Bld: 109 mg/dL — ABNORMAL HIGH (ref 70–99)
Potassium: 4.5 mEq/L (ref 3.7–5.3)
SODIUM: 139 meq/L (ref 137–147)

## 2013-09-21 LAB — URINE MICROSCOPIC-ADD ON

## 2013-09-21 LAB — URINALYSIS, ROUTINE W REFLEX MICROSCOPIC
Bilirubin Urine: NEGATIVE
Glucose, UA: NEGATIVE mg/dL
Ketones, ur: NEGATIVE mg/dL
Leukocytes, UA: NEGATIVE
NITRITE: NEGATIVE
Protein, ur: 100 mg/dL — AB
Specific Gravity, Urine: 1.016 (ref 1.005–1.030)
UROBILINOGEN UA: 1 mg/dL (ref 0.0–1.0)
pH: 5 (ref 5.0–8.0)

## 2013-09-21 LAB — CBC
HCT: 36.7 % — ABNORMAL LOW (ref 39.0–52.0)
Hemoglobin: 10.8 g/dL — ABNORMAL LOW (ref 13.0–17.0)
MCH: 26.3 pg (ref 26.0–34.0)
MCHC: 29.4 g/dL — ABNORMAL LOW (ref 30.0–36.0)
MCV: 89.3 fL (ref 78.0–100.0)
PLATELETS: 182 10*3/uL (ref 150–400)
RBC: 4.11 MIL/uL — ABNORMAL LOW (ref 4.22–5.81)
RDW: 17.1 % — AB (ref 11.5–15.5)
WBC: 5.4 10*3/uL (ref 4.0–10.5)

## 2013-09-21 LAB — HEPARIN LEVEL (UNFRACTIONATED): Heparin Unfractionated: 0.44 IU/mL (ref 0.30–0.70)

## 2013-09-21 MED ORDER — FUROSEMIDE 10 MG/ML IJ SOLN
60.0000 mg | Freq: Two times a day (BID) | INTRAMUSCULAR | Status: DC
  Administered 2013-09-21: 60 mg via INTRAVENOUS
  Filled 2013-09-21 (×2): qty 6

## 2013-09-21 NOTE — Progress Notes (Signed)
ANTICOAGULATION CONSULT NOTE -Follow up  Pharmacy Consult for Heparin  Indication: atrial fibrillation  No Known Allergies  Patient Measurements: Height: 5\' 10"  (177.8 cm) Weight: 232 lb 4.8 oz (105.371 kg) IBW/kg (Calculated) : 73  Vital Signs: Temp: 100.2 F (37.9 C) (05/31 1347) Temp src: Oral (05/31 1347) BP: 112/74 mmHg (05/31 1347) Pulse Rate: 103 (05/31 1347)  Labs:  Recent Labs  09/19/13 1941 09/19/13 2009 09/20/13 0054 09/20/13 0743 09/20/13 1550 09/21/13 0405  HGB 11.2*  --   --   --  10.8* 10.8*  HCT 35.4*  --   --   --  37.0* 36.7*  PLT 178  --   --   --  185 182  LABPROT 13.4  --   --   --   --   --   INR 1.04  --   --   --   --   --   HEPARINUNFRC  --   --   --  0.35 0.43 0.44  CREATININE  --  1.41* 1.41*  --   --  1.61*    Estimated Creatinine Clearance: 56.4 ml/min (by C-G formula based on Cr of 1.61).   Assessment: 63 y.o. male on heparin for afib. Heparin level therapeutic on 1400 units/hr. No bleeding noted. CBC stable.  Goal of Therapy:  Heparin level 0.3-0.7 units/ml Monitor platelets by anticoagulation protocol: Yes   Plan:  -Continue heparin gtt at 1400 units/hr -Daily CBC/HL -Monitor for bleeding  Sherlon Handing, PharmD, BCPS Clinical pharmacist, pager [phone] 09/21/2013,3:25 PM

## 2013-09-21 NOTE — Progress Notes (Signed)
Patient ID: [name], male   DOB: December 17, 1950, 63 y.o.   MRN: [id number]  Patient Name: [name] Date of Encounter: 09/21/2013     Active Problems:   Acute decompensated heart failure    SUBJECTIVE  Dyspnea still present and not improved from yesterday. No chest pain. Had a fever last night.  CURRENT MEDS . amiodarone  200 mg Oral BID  . aspirin EC  81 mg Oral Daily  . carvedilol  12.5 mg Oral BID WC  . digoxin  0.125 mg Oral Daily  . furosemide  40 mg Intravenous BID  . pneumococcal 23 valent vaccine  0.5 mL Intramuscular Tomorrow-1000  . potassium chloride SA  20 mEq Oral BID  . simvastatin  20 mg Oral QHS  . sodium chloride  3 mL Intravenous Q12H    OBJECTIVE  Filed Vitals:   09/20/13 2018 09/20/13 2237 09/21/13 0008 09/21/13 0356  BP: 128/68   129/77  Pulse: 108   103  Temp: 102.5 F (39.2 C) 101.8 F (38.8 C) 98 F (36.7 C) 100 F (37.8 C)  TempSrc: Oral Oral Oral Oral  Resp: 20   19  Height:      Weight:    232 lb 4.8 oz (105.371 kg)  SpO2: 95%   96%    Intake/Output Summary (Last 24 hours) at 09/21/13 0958 Last data filed at 09/21/13 0551  Gross per 24 hour  Intake 554.75 ml  Output   2340 ml  Net -1785.25 ml   Filed Weights   09/19/13 1948 09/20/13 0008 09/21/13 0356  Weight: 240 lb 5 oz (109.005 kg) 231 lb 7.7 oz (105 kg) 232 lb 4.8 oz (105.371 kg)    PHYSICAL EXAM  General: Pleasant, NAD. Neuro: Alert and oriented X 3. Moves all extremities spontaneously. Psych: Normal affect. HEENT:  Normal  Neck: Supple without bruits or JVD. Lungs:  Resp regular and unlabored, CTA. Heart: Ireg tachy no s3, s4, or murmurs. Abdomen: Soft, non-tender, non-distended, BS + x 4.  Extremities: No clubbing, cyanosis . DP/PT/Radials 2+ and equal bilaterally. 3+ woody edema  Accessory Clinical Findings  CBC  Recent Labs  09/20/13 1550 09/21/13 0405  WBC 4.6 5.4  HGB 10.8* 10.8*  HCT 37.0* 36.7*  MCV 89.8 89.3  PLT 185 824   Basic  Metabolic Panel  Recent Labs  09/20/13 0054 09/21/13 0405  NA 139 139  K 4.2 4.5  CL 98 95*  CO2 32 36*  GLUCOSE 108* 109*  BUN 18 24*  CREATININE 1.41* 1.61*  CALCIUM 8.7 8.7   Liver Function Tests  Recent Labs  09/19/13 2009  AST 50*  ALT 46  ALKPHOS 65  BILITOT 0.4  PROT 7.6  ALBUMIN 2.7*   No results found for this basename: LIPASE, AMYLASE,  in the last 72 hours Cardiac Enzymes No results found for this basename: CKTOTAL, CKMB, CKMBINDEX, TROPONINI,  in the last 72 hours BNP No components found with this basename: POCBNP,  D-Dimer No results found for this basename: DDIMER,  in the last 72 hours Hemoglobin A1C No results found for this basename: HGBA1C,  in the last 72 hours Fasting Lipid Panel No results found for this basename: CHOL, HDL, LDLCALC, TRIG, CHOLHDL, LDLDIRECT,  in the last 72 hours Thyroid Function Tests No results found for this basename: TSH, T4TOTAL, FREET3, T3FREE, THYROIDAB,  in the last 72 hours  TELE  Atrial fib with an RVR and atrial flutter with an RVR   Radiology/Studies  Dg Chest Port 1 View  09/19/2013   CLINICAL DATA:  Worsening shortness of breath, bilateral lower extremity swelling.  EXAM: PORTABLE CHEST - 1 VIEW  COMPARISON:  Chest radiograph November 11, 2012.  FINDINGS: Cardiac silhouette remains mildly enlarged mediastinal silhouette is nonsuspicious, status post median sternotomy. Mildly calcified aortic knob. Mild central pulmonary vasculature congestion and mild interstitial prominence, increased. Strandy densities in left lung base unchanged may reflect scar. No pleural effusions. No pneumothorax.  Multiple EKG lines overlie the patient and may obscure subtle underlying pathology. Soft tissue planes and included osseous structures are nonsuspicious.  IMPRESSION: Stable cardiomegaly with increasing central pulmonary vasculature congestion/ interstitial prominence concerning for pulmonary edema without pleural effusion or focal  consolidation.   Electronically Signed   By: Elon Alas   On: 09/19/2013 20:10    ASSESSMENT AND PLAN 1. Acute on chronic systolic and diastolic heart failure 2. Atrial fib/flutter with a RVR 3. Obesity 4. COPD 5. Fever  Rec: will continue IV lasix, and work on rate control. His volume overload is refractory to diuresis despite normal LV function by echo. RV down mildly.  Will obtain blood and urine cultures.  Dangelo Guzzetta,M.D.    09/21/2013 9:58 AM

## 2013-09-22 ENCOUNTER — Encounter (HOSPITAL_COMMUNITY): Payer: Self-pay | Admitting: *Deleted

## 2013-09-22 ENCOUNTER — Inpatient Hospital Stay (HOSPITAL_COMMUNITY): Payer: Medicaid Other

## 2013-09-22 DIAGNOSIS — R0602 Shortness of breath: Secondary | ICD-10-CM

## 2013-09-22 DIAGNOSIS — L03119 Cellulitis of unspecified part of limb: Secondary | ICD-10-CM

## 2013-09-22 DIAGNOSIS — I4891 Unspecified atrial fibrillation: Secondary | ICD-10-CM

## 2013-09-22 DIAGNOSIS — L02419 Cutaneous abscess of limb, unspecified: Secondary | ICD-10-CM

## 2013-09-22 DIAGNOSIS — L0291 Cutaneous abscess, unspecified: Secondary | ICD-10-CM

## 2013-09-22 DIAGNOSIS — L0390 Cellulitis, unspecified: Secondary | ICD-10-CM

## 2013-09-22 DIAGNOSIS — I2510 Atherosclerotic heart disease of native coronary artery without angina pectoris: Secondary | ICD-10-CM

## 2013-09-22 DIAGNOSIS — R509 Fever, unspecified: Secondary | ICD-10-CM

## 2013-09-22 DIAGNOSIS — Z9119 Patient's noncompliance with other medical treatment and regimen: Secondary | ICD-10-CM

## 2013-09-22 DIAGNOSIS — Z7901 Long term (current) use of anticoagulants: Secondary | ICD-10-CM

## 2013-09-22 DIAGNOSIS — I2589 Other forms of chronic ischemic heart disease: Secondary | ICD-10-CM

## 2013-09-22 DIAGNOSIS — Z91199 Patient's noncompliance with other medical treatment and regimen due to unspecified reason: Secondary | ICD-10-CM

## 2013-09-22 DIAGNOSIS — I359 Nonrheumatic aortic valve disorder, unspecified: Secondary | ICD-10-CM

## 2013-09-22 DIAGNOSIS — B9689 Other specified bacterial agents as the cause of diseases classified elsewhere: Secondary | ICD-10-CM

## 2013-09-22 DIAGNOSIS — J449 Chronic obstructive pulmonary disease, unspecified: Secondary | ICD-10-CM

## 2013-09-22 LAB — BASIC METABOLIC PANEL
BUN: 26 mg/dL — AB (ref 6–23)
CO2: 33 mEq/L — ABNORMAL HIGH (ref 19–32)
Calcium: 8.4 mg/dL (ref 8.4–10.5)
Chloride: 96 mEq/L (ref 96–112)
Creatinine, Ser: 1.78 mg/dL — ABNORMAL HIGH (ref 0.50–1.35)
GFR, EST AFRICAN AMERICAN: 45 mL/min — AB (ref >90)
GFR, EST NON AFRICAN AMERICAN: 39 mL/min — AB (ref >90)
Glucose, Bld: 120 mg/dL — ABNORMAL HIGH (ref 70–99)
Potassium: 4.8 mEq/L (ref 3.7–5.3)
Sodium: 139 mEq/L (ref 137–147)

## 2013-09-22 LAB — CBC
HCT: 36.6 % — ABNORMAL LOW (ref 39.0–52.0)
HEMOGLOBIN: 11 g/dL — AB (ref 13.0–17.0)
MCH: 26.9 pg (ref 26.0–34.0)
MCHC: 30.1 g/dL (ref 30.0–36.0)
MCV: 89.5 fL (ref 78.0–100.0)
PLATELETS: 177 10*3/uL (ref 150–400)
RBC: 4.09 MIL/uL — ABNORMAL LOW (ref 4.22–5.81)
RDW: 17 % — ABNORMAL HIGH (ref 11.5–15.5)
WBC: 5 10*3/uL (ref 4.0–10.5)

## 2013-09-22 LAB — URINE CULTURE
Colony Count: 25000
SPECIAL REQUESTS: NORMAL

## 2013-09-22 LAB — SEDIMENTATION RATE: Sed Rate: 110 mm/hr — ABNORMAL HIGH (ref 0–16)

## 2013-09-22 LAB — C-REACTIVE PROTEIN: CRP: 4.5 mg/dL — ABNORMAL HIGH (ref <0.60)

## 2013-09-22 LAB — HEPARIN LEVEL (UNFRACTIONATED): Heparin Unfractionated: 0.61 IU/mL (ref 0.30–0.70)

## 2013-09-22 LAB — URIC ACID: Uric Acid, Serum: 9.4 mg/dL — ABNORMAL HIGH (ref 4.0–7.8)

## 2013-09-22 MED ORDER — THIAMINE HCL 100 MG/ML IJ SOLN
100.0000 mg | Freq: Every day | INTRAMUSCULAR | Status: DC
  Filled 2013-09-22 (×4): qty 1

## 2013-09-22 MED ORDER — VANCOMYCIN HCL 10 G IV SOLR
1500.0000 mg | INTRAVENOUS | Status: DC
  Filled 2013-09-22: qty 1500

## 2013-09-22 MED ORDER — LORAZEPAM 2 MG/ML IJ SOLN: 1.0000 mg | Freq: Four times a day (QID) | INTRAMUSCULAR | Status: AC | PRN

## 2013-09-22 MED ORDER — LORAZEPAM 1 MG PO TABS: 1.0000 mg | ORAL_TABLET | Freq: Four times a day (QID) | ORAL | Status: AC | PRN

## 2013-09-22 MED ORDER — ADULT MULTIVITAMIN W/MINERALS CH
1.0000 | ORAL_TABLET | Freq: Every day | ORAL | Status: DC
  Administered 2013-09-22 – 2013-09-30 (×9): 1 via ORAL
  Filled 2013-09-22 (×9): qty 1

## 2013-09-22 MED ORDER — METOLAZONE 5 MG PO TABS
5.0000 mg | ORAL_TABLET | Freq: Every day | ORAL | Status: DC
  Administered 2013-09-22 – 2013-09-23 (×2): 5 mg via ORAL
  Filled 2013-09-22 (×3): qty 1

## 2013-09-22 MED ORDER — SODIUM CHLORIDE 0.9 % IV SOLN
2000.0000 mg | Freq: Once | INTRAVENOUS | Status: AC
  Administered 2013-09-22: 2000 mg via INTRAVENOUS
  Filled 2013-09-22: qty 2000

## 2013-09-22 MED ORDER — FUROSEMIDE 10 MG/ML IJ SOLN
80.0000 mg | Freq: Three times a day (TID) | INTRAMUSCULAR | Status: DC
  Administered 2013-09-22 – 2013-09-23 (×6): 80 mg via INTRAVENOUS
  Filled 2013-09-22 (×9): qty 8

## 2013-09-22 MED ORDER — ALBUTEROL SULFATE (2.5 MG/3ML) 0.083% IN NEBU: 3.0000 mL | INHALATION_SOLUTION | Freq: Four times a day (QID) | RESPIRATORY_TRACT | Status: DC | PRN

## 2013-09-22 MED ORDER — CEFEPIME HCL 2 G IJ SOLR
2.0000 g | INTRAMUSCULAR | Status: DC
  Administered 2013-09-22 – 2013-09-23 (×2): 2 g via INTRAVENOUS
  Filled 2013-09-22 (×2): qty 2

## 2013-09-22 MED ORDER — FOLIC ACID 1 MG PO TABS
1.0000 mg | ORAL_TABLET | Freq: Every day | ORAL | Status: DC
  Administered 2013-09-22 – 2013-09-30 (×9): 1 mg via ORAL
  Filled 2013-09-22 (×9): qty 1

## 2013-09-22 MED ORDER — VITAMIN B-1 100 MG PO TABS
100.0000 mg | ORAL_TABLET | Freq: Every day | ORAL | Status: DC
  Administered 2013-09-22 – 2013-09-30 (×8): 100 mg via ORAL
  Filled 2013-09-22 (×9): qty 1

## 2013-09-22 NOTE — Consult Note (Addendum)
Requesting physician: Dr Candee Furbish  Reason for consultation: FUO   History of Present Illness: 63 year old male with multiple cardiac history including severe AS , coronary artery disease, status post aVR and CABG in June 2014, NYHA class III CHF with EF of 50-55% this admission, A. fib not on anticoagulation, hypertension COPD, chronic left leg cellulitis, peripheral artery disease, history of gout was admitted to cardiology service on 5/29 with volume overload in A. fib/A. flutter with rapid ventricular response. Patient has been started on IV Lasix 80 mg 3 times a day and metolazone. Patient also has been started on IV heparin and for rapid A. fib . His home dose of carvedilol has been resumed his rate has been well-controlled. He is also on amiodarone. Patient informs that his shortness of breath has been slightly improved but does get dyspneic on minimal exertion. He also reports increased fatigue and being minimally ambulatory  for past several months. Hospitalist was consulted as patient has been febrile since 5/30 with MAXIMUM TEMPERATURE of 102.41F with multiple temperature spikes on 5/31 and again this morning with temperature of 102.41F. he reports some chills early this morning. Patient denies any headache, blurred vision, dizziness, chest pain, palpitations, cough, nausea, vomiting, abdominal pain, dysuria, hematuria, diarrhea. Denies any sick contacts or recent illness. Denies any fevers at home. Patient reports having some pain over his right knee and right ankle similar to his gouty attacks yesterday. He has peripheral vascular disease with previous cellulitis and weeping areas to his left leg which appears to be chronic and has been improving as per patient.  Workup including blood cultures on admission, UA, chest x-ray on admission and lumbar spine x-rays on admission have all been negative.   Allergies:  No Known Allergies    Past Medical History  Diagnosis Date  .  Hypertension   . Gout   . Aortic stenosis     echo 06/19/08 with nomal LV function, moderate concentric LVH moderate aortic stenosis area 0.99 cm squared, peak gradient of 50 and mean of 31  . Atrial fibrillation   . Hyperlipidemia   . Atrial fibrillation with RVR, was in atrial fib in 04/2011 09/16/2012  . Tobacco use 09/16/2012  . Heart murmur   . Acute combined systolic and diastolic CHF, NYHA class 3 -- previous normal LV function and moderate AS- re-evaluate; EF now 30-35% with regional [facility] 09/16/2012  . Aortic stenosis, severe 09/23/2012  . CAD (coronary artery disease), single vessel disease 09/23/2012  . Cellulitis, lower extremity- treated 09/23/2012  . PAD (peripheral artery disease), decreased bil. ABIs 09/23/2012  . Gout flare. 09/29/12, Lt knee, improved with colchicine. 09/30/2012  . H/O aortic valve replacement   . Congestive heart failure     Past Surgical History  Procedure Laterality Date  . Iliac artery stent  10/20/08    stent to lt iliac  . Aorto bifem bypass  12/18/08    by Dr. Trula Slade  . Coronary artery bypass graft N/A 10/08/2012    Procedure: CORONARY ARTERY BYPASS GRAFTING (CABG);  Surgeon: Grace Isaac, MD;  Location: [place];  Service: Open Heart Surgery;  Laterality: N/A;  Coronary Artery bypass Graft times two utilizing the left greater saphenous vein harvested endoscopically  . Aortic valve replacement N/A 10/08/2012    Procedure: AORTIC VALVE REPLACEMENT (AVR);  Surgeon: Grace Isaac, MD;  Location: [place];  Service: Open Heart Surgery;  Laterality: N/A;  . Maze N/A 10/08/2012    Procedure: MAZE;  Surgeon: Percell Miller  Maryruth Bun, MD;  Location: [place];  Service: Open Heart Surgery;  Laterality: N/A;  . Intraoperative transesophageal echocardiogram N/A 10/08/2012    Procedure: INTRAOPERATIVE TRANSESOPHAGEAL ECHOCARDIOGRAM;  Surgeon: Grace Isaac, MD;  Location: [place];  Service: Open Heart Surgery;  Laterality: N/A;  . Endovein harvest of greater saphenous vein Bilateral  10/08/2012    Procedure: ENDOVEIN HARVEST OF GREATER SAPHENOUS VEIN;  Surgeon: Grace Isaac, MD;  Location: [place];  Service: Open Heart Surgery;  Laterality: Bilateral;  . US echocardiography  06/20/2011    mod concentric LVH,LA severely dilated,RA mildly dilated,mod. ca+ of the mitral apparatus,trace MR,mod. ca+ AOV w/stenosis.  Marland Kitchen Nm myocar perf wall motion  08/25/2008    normal  . Cardioversion N/A 02/25/2013    Procedure: CARDIOVERSION;  Surgeon: Sanda Klein, MD;  Location: [facility];  Service: Cardiovascular;  Laterality: N/A;    Medications:  Scheduled Meds: . amiodarone  200 mg Oral BID  . aspirin EC  81 mg Oral Daily  . carvedilol  12.5 mg Oral BID WC  . digoxin  0.125 mg Oral Daily  . furosemide  80 mg Intravenous TID  . metolazone  5 mg Oral Daily  . pneumococcal 23 valent vaccine  0.5 mL Intramuscular Tomorrow-1000  . potassium chloride SA  20 mEq Oral BID  . simvastatin  20 mg Oral QHS  . sodium chloride  3 mL Intravenous Q12H   Continuous Infusions: . heparin 1,400 Units/hr (09/22/13 0845)   PRN Meds:.sodium chloride, acetaminophen, albuterol, sodium chloride  Social History:  reports that he quit smoking about a year ago. His smoking use included Cigarettes. He has a 43 pack-year smoking history. He has never used smokeless tobacco. He reports that he drinks alcohol. He reports that he does not use illicit drugs.  Family History  Problem Relation Age of Onset  . Heart attack Father     died at 1 with MI  . Heart disease Brother   . Heart attack Brother   . Heart attack Brother     Review of Systems:  Constitutional: Denies fever, chills, diaphoresis, appetite change.  fatigue.  HEENT: Denies photophobia, eye pain, redness, hearing loss, ear pain, congestion, sore throat, rhinorrhea, sneezing, mouth sores, trouble swallowing, neck pain. Respiratory:  SOB, DOE, denies cough, chest tightness,  and wheezing.   Cardiovascular: Denies chest pain,  palpitations and increased leg swelling.  Gastrointestinal: Denies nausea, vomiting, abdominal pain, diarrhea, constipation, blood in stool and abdominal distention.  Genitourinary: Denies dysuria, urgency, frequency, hematuria, flank pain and difficulty urinating.  Endocrine: Denies: hot or cold intolerance,  polyuria, polydipsia. Musculoskeletal: right knee and ankle pain, weakness, chronic  back pain, denies joint swelling,  Neurological: Denies dizziness, seizures, syncope, weakness, light-headedness, numbness and headaches.  Psychiatric/Behavioral: Denies  confusion   Physical Exam:  Filed Vitals:   09/21/13 1347 09/21/13 2025 09/22/13 0352 09/22/13 0540  BP: 112/74 137/75 112/72   Pulse: 103 97 100   Temp: 100.2 F (37.9 C) 99.9 F (37.7 C) 102.3 F (39.1 C) 99.4 F (37.4 C)  TempSrc: Oral Oral Oral Oral  Resp: '18 19 20   ' Height:      Weight:   106.187 kg (234 lb 1.6 oz)   SpO2: 94% 93% 91% 93%     Intake/Output Summary (Last 24 hours) at 09/22/13 1043 Last data filed at 09/22/13 0400  Gross per 24 hour  Intake    480 ml  Output    820 ml  Net   -  340 ml    General: Elderly obese  male lying in bed in no acute distress. HEENT: No pallor, no icterus, no oral thrush, no cervical lymphadenopathy Heart: S1 and S2 irregularly [id number],2/9 systolic murmur Lungs: Diminished breath sounds in bilateral lung bases, no crackles, rhonchi or wheeze Abdomen: Soft, nontender, nondistended, positive bowel sounds. Extremities: chronic cellulitic changes, 2  weeping it is over left leg, no obvious bruising and discharged. No back tenderness. No ankle or knee tenderness or swelling, right calf tenderness to pressure. Normal ROM of the hips. Neuro: AAOX3 nonfocal.  Labs on Admission:  CBC:    Component Value Date/Time   WBC 5.0 09/22/2013 0450   HGB 11.0* 09/22/2013 0450   HCT 36.6* 09/22/2013 0450   PLT 177 09/22/2013 0450   MCV 89.5 09/22/2013 7989    Basic Metabolic Panel:     Component Value Date/Time   NA 139 09/22/2013 0450   K 4.8 09/22/2013 0450   CL 96 09/22/2013 0450   CO2 33* 09/22/2013 0450   BUN 26* 09/22/2013 0450   CREATININE 1.78* 09/22/2013 0450   CREATININE 1.29 01/30/2013 1008   GLUCOSE 120* 09/22/2013 0450   CALCIUM 8.4 09/22/2013 0450    Radiological Exams on Admission: Dg Lumbar Spine 2-3 Views  09/20/2013   CLINICAL DATA:  Lower lumbar pain  EXAM: LUMBAR SPINE - 2-3 VIEW  COMPARISON:  None.  FINDINGS: There are 5 nonrib bearing lumbar-type vertebral bodies. The vertebral body heights are maintained. The alignment is anatomic. There is no spondylolysis. There is no acute fracture or static listhesis. Degenerative disc disease most significant at L3-4, L4-5 and L5-S1.  The SI joints are unremarkable.  Abdominal aortic atherosclerosis.  Left iliac stent noted.  IMPRESSION: Lumbar spine spondylosis at L3-4, L4-5 and L5-S1. No acute osseous injury of the lumbar spine.   Electronically Signed   By: Kathreen Devoid   On: 09/20/2013 17:20    Assessment/recommendations  Fever of unknown origin (FUO) Workup so far have been negative. Blood cultures so far with no growth. Patient having temperature spikes without clear etiology. WBC normal. -Given history of gout flare and complain of right ankle and knee pain yesterday, acute gouty arthritis is one possibility. Will check uric acid level. -Follow final blood culture and sensitivity. Check ESR, CRP and a repeat chest x-ray. -Given right calf tenderness and poor mobility I will check Doppler of his lower extremity to rule out DVT. -I will hold off on initiating antibiotics at this time and monitor for now. The patient continues to have temperature spikes we will start him on empiric antibiotics. The patient continues to have fever despite negative workup I would recommend obtaining a TEE to look for any vegetation in the valve given hx of aVR.  Active Problems:   Atrial fibrillation with RVR Currently rate controlled on  amiodarone, carvedilol. His CHADS2 score is 2. He has been started on IV heparin by the primary team we'll plan on oral anticoagulation.    Acute decompensated heart failure On IV Lasix 80 mg 3 times a day. On aspirin and digoxin as well. Has negative balance.      COPD (chronic obstructive pulmonary disease) No signs of exacerbation. Continue O2 via nasal cannula and albuterol nebs  CAD On aspirin,  statin and carvedilol  Deconditioning PT eval  Chronic  Left leg cellulitis and peripheral vascular disease No signs of acute infection and as per pt the leg appears much better now. Dressing as per wound care consult  Acute kidney injury ?cardiorenal syndrome and worsened renal fn while on high dose Lasix. Has good urine output. Continue to monitor.  Thank you for the interesting consult. Hospitalist will continue to follow.   Time Spent on Admission: 70 minutes  Tabor Bartram 09/22/2013, 10:43 AM Pager [phone] If 7PM-7AM, please contact night-coverage at [email or web address], password [facility]

## 2013-09-22 NOTE — Progress Notes (Signed)
ANTIBIOTIC CONSULT NOTE - INITIAL  Pharmacy Consult for vanc/cefepime Indication: GPR bacteremia  No Known Allergies  Patient Measurements: Height: 5\' 10"  (177.8 cm) Weight: 234 lb 1.6 oz (106.187 kg) IBW/kg (Calculated) : 73 Adjusted Body Weight:   Vital Signs: Temp: 99.4 F (37.4 C) (06/01 0540) Temp src: Oral (06/01 0540) BP: 112/72 mmHg (06/01 0352) Pulse Rate: 100 (06/01 0352) Intake/Output from previous day: 05/31 0701 - 06/01 0700 In: 480 [P.O.:480] Out: 820 [Urine:820] Intake/Output from this shift:    Labs:  Recent Labs  09/20/13 0054 09/20/13 1550 09/21/13 0405 09/22/13 0450  WBC  --  4.6 5.4 5.0  HGB  --  10.8* 10.8* 11.0*  PLT  --  185 182 177  CREATININE 1.41*  --  1.61* 1.78*   Estimated Creatinine Clearance: 51.2 ml/min (by C-G formula based on Cr of 1.78). No results found for this basename: VANCOTROUGH, Corlis Leak, VANCORANDOM, [place], GENTPEAK, GENTRANDOM, TOBRATROUGH, TOBRAPEAK, TOBRARND, AMIKACINPEAK, AMIKACINTROU, AMIKACIN,  in the last 72 hours   Microbiology: Recent Results (from the past 720 hour(s))  CULTURE, BLOOD (ROUTINE X 2)     Status: None   Collection Time    09/21/13 10:11 AM      Result Value Ref Range Status   Specimen Description BLOOD RIGHT ARM   Final   Special Requests BOTTLES DRAWN AEROBIC AND ANAEROBIC 5CC   Final   Culture  Setup Time     Final   Value: 09/21/2013 18:11     Performed at [facility]   Culture     Final   Value: GRAM POSITIVE RODS     Note: Gram Stain Report Called to,Read Back By and Verified With: HEATHER B BY INGRAM A 09/22/13 1205PM     Performed at [facility]   Report Status PENDING   Incomplete  CULTURE, BLOOD (ROUTINE X 2)     Status: None   Collection Time    09/21/13 10:16 AM      Result Value Ref Range Status   Specimen Description BLOOD RIGHT ARM   Final   Special Requests BOTTLES DRAWN AEROBIC AND ANAEROBIC 5CC   Final   Culture  Setup Time     Final   Value:  09/21/2013 18:11     Performed at [facility]   Culture     Final   Value: GRAM POSITIVE RODS     Note: Gram Stain Report Called to,Read Back By and Verified With: HEATHER B BY INGRAM A 09/22/13 1205PM     Performed at [facility]   Report Status PENDING   Incomplete    Medical History: Past Medical History  Diagnosis Date  . Hypertension   . Gout   . Aortic stenosis     echo 06/19/08 with nomal LV function, moderate concentric LVH moderate aortic stenosis area 0.99 cm squared, peak gradient of 50 and mean of 31  . Atrial fibrillation   . Hyperlipidemia   . Atrial fibrillation with RVR, was in atrial fib in 04/2011 09/16/2012  . Tobacco use 09/16/2012  . Heart murmur   . Acute combined systolic and diastolic CHF, NYHA class 3 -- previous normal LV function and moderate AS- re-evaluate; EF now 30-35% with regional [facility] 09/16/2012  . Aortic stenosis, severe 09/23/2012  . CAD (coronary artery disease), single vessel disease 09/23/2012  . Cellulitis, lower extremity- treated 09/23/2012  . PAD (peripheral artery disease), decreased bil. ABIs 09/23/2012  . Gout flare. 09/29/12, Lt knee, improved with  colchicine. 09/30/2012  . H/O aortic valve replacement   . Congestive heart failure     Medications:  Scheduled:  . amiodarone  200 mg Oral BID  . aspirin EC  81 mg Oral Daily  . carvedilol  12.5 mg Oral BID WC  . ceFEPime (MAXIPIME) IV  2 g Intravenous Q24H  . digoxin  0.125 mg Oral Daily  . furosemide  80 mg Intravenous TID  . metolazone  5 mg Oral Daily  . pneumococcal 23 valent vaccine  0.5 mL Intramuscular Tomorrow-1000  . potassium chloride SA  20 mEq Oral BID  . simvastatin  20 mg Oral QHS  . sodium chloride  3 mL Intravenous Q12H  . [START ON 09/23/2013] vancomycin  1,500 mg Intravenous Q24H  . vancomycin  2,000 mg Intravenous Once   Infusions:  . heparin 1,400 Units/hr (09/22/13 0845)   Assessment: 63 yo with a hx tissue AVR who has blood cx that have grown out 2/2 GPR.  MD ordered vanc/cefepime empirically. ID has been consulted. He has some baseline renal impairment.   Goal of Therapy:  Vancomycin trough level 15-20 mcg/ml  Plan:   Vanc 2g IV x1 then 1.5g IV q24 Cefepime 2g IV q24 Will adjust vanc if scr worsens

## 2013-09-22 NOTE — Progress Notes (Signed)
Physical Therapy Treatment Patient Details Name: [name] MRN: [id number] DOB: January 03, 1951 Today's Date: 22-Oct-2013    History of Present Illness Pt admit with CHF and afib and now with fever of unknown origin.  PMH- CABG, COPD    PT Comments    Pt fatigued and only amb to chair.  Follow Up Recommendations  Supervision - Intermittent;Home health PT     Equipment Recommendations  None recommended by PT    Recommendations for Other Services       Precautions / Restrictions Precautions Precautions: Fall Restrictions Weight Bearing Restrictions: No    Mobility  Bed Mobility Overal bed mobility: Modified Independent             General bed mobility comments: HOB elevated.  Transfers Overall transfer level: Modified independent Equipment used: Rolling walker (2 wheeled)                Ambulation/Gait Ambulation/Gait assistance: Supervision Ambulation Distance (Feet): 2 Feet Assistive device: Rolling walker (2 wheeled) Gait Pattern/deviations: Step-through pattern;Decreased step length - left;Decreased step length - right     General Gait Details: Pt only amb to chair due to fatigue.   Stairs            Wheelchair Mobility    Modified Rankin (Stroke Patients Only)       Balance                                    Cognition Arousal/Alertness: Awake/alert Behavior During Therapy: WFL for tasks assessed/performed Overall Cognitive Status: Within Functional Limits for tasks assessed                      Exercises      General Comments        Pertinent Vitals/Pain Pt on 4L O2    Home Living                      Prior Function            PT Goals (current goals can now be found in the care plan section) Progress towards PT goals: Not progressing toward goals - comment (fatigue and fever)    Frequency  Min 3X/week    PT Plan Current plan remains appropriate    Co-evaluation              End of Session Equipment Utilized During Treatment: Oxygen Activity Tolerance: Patient limited by fatigue Patient left: in chair;with call bell/phone within reach     Time: 1140-1159 PT Time Calculation (min): 19 min  Charges:  $Gait Training: 8-22 mins                    G Codes:      Shary Decamp Caycee Wanat 10-22-2013, 1:20 PM  [employer] PT [phone]

## 2013-09-22 NOTE — Consult Note (Addendum)
[facility]  Total days of antibiotics 1        Day 1 cefepime        Day 1 vanco              Reason for Consult: gpr bacteremia concern for endocarditis    Referring Physician: [email or web address]  Active Problems:   Atrial fibrillation with RVR   Acute decompensated heart failure   Fever of unknown origin (FUO)   COPD (chronic obstructive pulmonary disease)    HPI: [name] is a 63 y.o. male  with past medical history  of severe aortic stenosis s/p tissue AV replacement, atrial fibrillation, heart failure, hypertension, and coronary artery disease who presents with shortness of breath that has progressed over the last few months now with severe DOE. + orthopnea in addition to abdominal distention and lower leg swelling. He reports intermittent weeping of wounds of lower extremity due to le edema. on admit, he was found to be hypoxic on room air and put on 2 L nasal cannula. cxr showing pulmonary edema. He was admitted for  heart failure exacerbation. He was afebrile on admit however has developed high fevers for the last 2 days of admit up to 102.3 yesterday. Inaddition, he had 2 sets of blood cx showing gpr. He denies any dental work, or diarrhea recently. No fever,chills,nightsweats at home. WBC not significantly elevated   Past Medical History  Diagnosis Date  . Hypertension   . Gout   . Aortic stenosis     echo 06/19/08 with nomal LV function, moderate concentric LVH moderate aortic stenosis area 0.99 cm squared, peak gradient of 50 and mean of 31  . Atrial fibrillation   . Hyperlipidemia   . Atrial fibrillation with RVR, was in atrial fib in 04/2011 09/16/2012  . Tobacco use 09/16/2012  . Heart murmur   . Acute combined systolic and diastolic CHF, NYHA class 3 -- previous normal LV function and moderate AS- re-evaluate; EF now 30-35% with regional [facility] 09/16/2012  . Aortic stenosis, severe 09/23/2012  . CAD (coronary artery disease), single vessel disease  09/23/2012  . Cellulitis, lower extremity- treated 09/23/2012  . PAD (peripheral artery disease), decreased bil. ABIs 09/23/2012  . Gout flare. 09/29/12, Lt knee, improved with colchicine. 09/30/2012  . H/O aortic valve replacement   . Congestive heart failure     Allergies: No Known Allergies   MEDICATIONS: . amiodarone  200 mg Oral BID  . aspirin EC  81 mg Oral Daily  . carvedilol  12.5 mg Oral BID WC  . ceFEPime (MAXIPIME) IV  2 g Intravenous Q24H  . digoxin  0.125 mg Oral Daily  . furosemide  80 mg Intravenous TID  . metolazone  5 mg Oral Daily  . pneumococcal 23 valent vaccine  0.5 mL Intramuscular Tomorrow-1000  . potassium chloride SA  20 mEq Oral BID  . simvastatin  20 mg Oral QHS  . sodium chloride  3 mL Intravenous Q12H  . [START ON 09/23/2013] vancomycin  1,500 mg Intravenous Q24H  . vancomycin  2,000 mg Intravenous Once    History  Substance Use Topics  . Smoking status: Former Smoker -- 1.00 packs/day for 43 years    Types: Cigarettes    Quit date: 09/12/2012  . Smokeless tobacco: Never Used     Comment: More than 43 + pack years as he smoked up to 2 1/2 ppd for many years. Had a period of cessation after femoral  stent.  . Alcohol Use: Yes     Comment: social    Family History  Problem Relation Age of Onset  . Heart attack Father     died at 4 with MI  . Heart disease Brother   . Heart attack Brother   . Heart attack Brother      Review of Systems  Constitutional: Negative for fever, chills, diaphoresis, activity change, appetite change, fatigue and unexpected weight change.  HENT: Negative for congestion, sore throat, rhinorrhea, sneezing, trouble swallowing and sinus pressure.  Eyes: Negative for photophobia and visual disturbance.  Respiratory: +SOB with DOE. cough, chest tightness, wheezing and stridor.  Cardiovascular: Negative for chest pain, palpitations and leg swelling.  Gastrointestinal: Negative for nausea, vomiting, abdominal pain, diarrhea,  constipation, blood in stool, abdominal distention and anal bleeding.  Genitourinary: Negative for dysuria, hematuria, flank pain and difficulty urinating.  Musculoskeletal: Negative for myalgias, back pain, joint swelling, arthralgias and gait problem.  Skin:weeping wounds on lower extremities Neurological: Negative for dizziness, tremors, weakness and light-headedness.  Hematological: Negative for adenopathy. Does not bruise/bleed easily.  Psychiatric/Behavioral: Negative for behavioral problems, confusion, sleep disturbance, dysphoric mood, decreased concentration and agitation.     OBJECTIVE: Temp:  [99.4 F (37.4 C)-102.3 F (39.1 C)] 100.4 F (38 C) (06/01 1344) Pulse Rate:  [97-107] 107 (06/01 1344) Resp:  [19-20] 20 (06/01 1344) BP: (106-137)/(55-75) 106/55 mmHg (06/01 1344) SpO2:  [91 %-96 %] 96 % (06/01 1344) Weight:  [234 lb 1.6 oz (106.187 kg)] 234 lb 1.6 oz (106.187 kg) (06/01 0352)  General: Elderly obese male sitting up in chair in no acute distress.  HEENT: No pallor, no icterus, no oral thrush, no cervical lymphadenopathy  Heart: S1 and S2 irregularly ZOXWRUEAV,4/0 systolic murmur  Lungs: Diminished breath sounds in bilateral lung bases, no crackles, rhonchi or wheeze  Abdomen: Soft, nontender, nondistended, positive bowel sounds.  Extremities: +2 edema bilaterally LE Skin:  Cellulitis to distal pretibial area of left leg, and posterior right leg, weeping over left leg, no obvious bruising and discharged. No back tenderness. No ankle or knee tenderness or swelling, right calf tenderness to pressure.  Neuro: AAOX3, CN 2-12 intact  LABS: Results for orders placed during the hospital encounter of 09/19/13 (from the past 48 hour(s))  HEPARIN LEVEL (UNFRACTIONATED)     Status: None   Collection Time    09/20/13  3:50 PM      Result Value Ref Range   Heparin Unfractionated 0.43  0.30 - 0.70 IU/mL   Comment:            IF HEPARIN RESULTS ARE BELOW     EXPECTED VALUES,  AND PATIENT     DOSAGE HAS BEEN CONFIRMED,     SUGGEST FOLLOW UP TESTING     OF ANTITHROMBIN III LEVELS.  CBC     Status: Abnormal   Collection Time    09/20/13  3:50 PM      Result Value Ref Range   WBC 4.6  4.0 - 10.5 K/uL   RBC 4.12 (*) 4.22 - 5.81 MIL/uL   Hemoglobin 10.8 (*) 13.0 - 17.0 g/dL   HCT 37.0 (*) 39.0 - 52.0 %   MCV 89.8  78.0 - 100.0 fL   MCH 26.2  26.0 - 34.0 pg   MCHC 29.2 (*) 30.0 - 36.0 g/dL   RDW 17.1 (*) 11.5 - 15.5 %   Platelets 185  150 - 400 K/uL  BASIC METABOLIC PANEL     Status: Abnormal  Collection Time    09/21/13  4:05 AM      Result Value Ref Range   Sodium 139  137 - 147 mEq/L   Potassium 4.5  3.7 - 5.3 mEq/L   Chloride 95 (*) 96 - 112 mEq/L   CO2 36 (*) 19 - 32 mEq/L   Glucose, Bld 109 (*) 70 - 99 mg/dL   BUN 24 (*) 6 - 23 mg/dL   Creatinine, Ser 1.61 (*) 0.50 - 1.35 mg/dL   Calcium 8.7  8.4 - 10.5 mg/dL   GFR calc non Af Amer 44 (*) >90 mL/min   GFR calc Af Amer 51 (*) >90 mL/min   Comment: (NOTE)     The eGFR has been calculated using the CKD EPI equation.     This calculation has not been validated in all clinical situations.     eGFR's persistently <90 mL/min signify possible Chronic Kidney     Disease.  HEPARIN LEVEL (UNFRACTIONATED)     Status: None   Collection Time    09/21/13  4:05 AM      Result Value Ref Range   Heparin Unfractionated 0.44  0.30 - 0.70 IU/mL   Comment:            IF HEPARIN RESULTS ARE BELOW     EXPECTED VALUES, AND PATIENT     DOSAGE HAS BEEN CONFIRMED,     SUGGEST FOLLOW UP TESTING     OF ANTITHROMBIN III LEVELS.  CBC     Status: Abnormal   Collection Time    09/21/13  4:05 AM      Result Value Ref Range   WBC 5.4  4.0 - 10.5 K/uL   RBC 4.11 (*) 4.22 - 5.81 MIL/uL   Hemoglobin 10.8 (*) 13.0 - 17.0 g/dL   HCT 36.7 (*) 39.0 - 52.0 %   MCV 89.3  78.0 - 100.0 fL   MCH 26.3  26.0 - 34.0 pg   MCHC 29.4 (*) 30.0 - 36.0 g/dL   RDW 17.1 (*) 11.5 - 15.5 %   Platelets 182  150 - 400 K/uL  CULTURE, BLOOD  (ROUTINE X 2)     Status: None   Collection Time    09/21/13 10:11 AM      Result Value Ref Range   Specimen Description BLOOD RIGHT ARM     Special Requests BOTTLES DRAWN AEROBIC AND ANAEROBIC 5CC     Culture  Setup Time       Value: 09/21/2013 18:11     Performed at [facility]   Culture       Value: GRAM POSITIVE RODS     Note: Gram Stain Report Called to,Read Back By and Verified With: HEATHER B BY INGRAM A 09/22/13 1205PM     Performed at [facility]   Report Status PENDING    CULTURE, BLOOD (ROUTINE X 2)     Status: None   Collection Time    09/21/13 10:16 AM      Result Value Ref Range   Specimen Description BLOOD RIGHT ARM     Special Requests BOTTLES DRAWN AEROBIC AND ANAEROBIC 5CC     Culture  Setup Time       Value: 09/21/2013 18:11     Performed at [facility]   Culture       Value: GRAM POSITIVE RODS     Note: Gram Stain Report Called to,Read Back By and Verified With: HEATHER B BY INGRAM A 09/22/13  1205PM     Performed at [facility]   Report Status PENDING    URINALYSIS, ROUTINE W REFLEX MICROSCOPIC     Status: Abnormal   Collection Time    09/21/13 10:24 AM      Result Value Ref Range   Color, Urine YELLOW  YELLOW   APPearance CLEAR  CLEAR   Specific Gravity, Urine 1.016  1.005 - 1.030   pH 5.0  5.0 - 8.0   Glucose, UA NEGATIVE  NEGATIVE mg/dL   Hgb urine dipstick TRACE (*) NEGATIVE   Bilirubin Urine NEGATIVE  NEGATIVE   Ketones, ur NEGATIVE  NEGATIVE mg/dL   Protein, ur 100 (*) NEGATIVE mg/dL   Urobilinogen, UA 1.0  0.0 - 1.0 mg/dL   Nitrite NEGATIVE  NEGATIVE   Leukocytes, UA NEGATIVE  NEGATIVE  URINE MICROSCOPIC-ADD ON     Status: None   Collection Time    09/21/13 10:24 AM      Result Value Ref Range   Squamous Epithelial / LPF RARE  RARE   WBC, UA 0-2  <3 WBC/hpf   RBC / HPF 0-2  <3 RBC/hpf   Bacteria, UA RARE  RARE  BASIC METABOLIC PANEL     Status: Abnormal   Collection Time    09/22/13  4:50 AM      Result  Value Ref Range   Sodium 139  137 - 147 mEq/L   Potassium 4.8  3.7 - 5.3 mEq/L   Chloride 96  96 - 112 mEq/L   CO2 33 (*) 19 - 32 mEq/L   Glucose, Bld 120 (*) 70 - 99 mg/dL   BUN 26 (*) 6 - 23 mg/dL   Creatinine, Ser 1.78 (*) 0.50 - 1.35 mg/dL   Calcium 8.4  8.4 - 10.5 mg/dL   GFR calc non Af Amer 39 (*) >90 mL/min   GFR calc Af Amer 45 (*) >90 mL/min   Comment: (NOTE)     The eGFR has been calculated using the CKD EPI equation.     This calculation has not been validated in all clinical situations.     eGFR's persistently <90 mL/min signify possible Chronic Kidney     Disease.  HEPARIN LEVEL (UNFRACTIONATED)     Status: None   Collection Time    09/22/13  4:50 AM      Result Value Ref Range   Heparin Unfractionated 0.61  0.30 - 0.70 IU/mL   Comment:            IF HEPARIN RESULTS ARE BELOW     EXPECTED VALUES, AND PATIENT     DOSAGE HAS BEEN CONFIRMED,     SUGGEST FOLLOW UP TESTING     OF ANTITHROMBIN III LEVELS.  CBC     Status: Abnormal   Collection Time    09/22/13  4:50 AM      Result Value Ref Range   WBC 5.0  4.0 - 10.5 K/uL   RBC 4.09 (*) 4.22 - 5.81 MIL/uL   Hemoglobin 11.0 (*) 13.0 - 17.0 g/dL   HCT 36.6 (*) 39.0 - 52.0 %   MCV 89.5  78.0 - 100.0 fL   MCH 26.9  26.0 - 34.0 pg   MCHC 30.1  30.0 - 36.0 g/dL   RDW 17.0 (*) 11.5 - 15.5 %   Platelets 177  150 - 400 K/uL  SEDIMENTATION RATE     Status: Abnormal   Collection Time    09/22/13  4:50 AM  Result Value Ref Range   Sed Rate 110 (*) 0 - 16 mm/hr  URIC ACID     Status: Abnormal   Collection Time    09/22/13  4:50 AM      Result Value Ref Range   Uric Acid, Serum 9.4 (*) 4.0 - 7.8 mg/dL    MICRO: 5/31 blood cx 2/2 :GPR IMAGING: Dg Lumbar Spine 2-3 Views  09/20/2013   CLINICAL DATA:  Lower lumbar pain  EXAM: LUMBAR SPINE - 2-3 VIEW  COMPARISON:  None.  FINDINGS: There are 5 nonrib bearing lumbar-type vertebral bodies. The vertebral body heights are maintained. The alignment is anatomic. There is no  spondylolysis. There is no acute fracture or static listhesis. Degenerative disc disease most significant at L3-4, L4-5 and L5-S1.  The SI joints are unremarkable.  Abdominal aortic atherosclerosis.  Left iliac stent noted.  IMPRESSION: Lumbar spine spondylosis at L3-4, L4-5 and L5-S1. No acute osseous injury of the lumbar spine.   Electronically Signed   By: Kathreen Devoid   On: 09/20/2013 17:20   Dg Chest Port 1 View  09/22/2013   CLINICAL DATA:  Shortness of breath, fever, evaluate for pneumonia  EXAM: PORTABLE CHEST - 1 VIEW  COMPARISON:  09/19/2013; 11/11/2012; 10/11/2012  FINDINGS: Grossly unchanged enlarged cardiac silhouette and mediastinal contours with atherosclerotic plaque within the thoracic aorta. Post median sternotomy CABG. The pulmonary vasculature remains indistinct with cephalization of flow. Kerley B-lines are identified within the peripheral aspect of the left mid and lower lung. No new focal airspace opacities. No definite pleural effusion pneumothorax. Unchanged bones.  IMPRESSION: Grossly unchanged findings most suggestive of pulmonary edema, though note, atypical infection could have a similar appearance. Clinical correlation is advised. Further evaluation with a PA and lateral chest radiograph may be obtained as clinically indicated.   Electronically Signed   By: Sandi Mariscal M.D.   On: 09/22/2013 11:29   TTE from 5/30: Study Conclusions  - Left ventricle: The cavity size was normal. Wall thickness was increased in a pattern of mild LVH. Systolic function was normal. The estimated ejection fraction was in the range of 50% to 55%. - Left atrium: The atrium was severely dilated. - Right ventricle: The cavity size was mildly dilated. Systolic function was mildly reduced. - Right atrium: The atrium was severely dilated.   Assessment/Plan:  63yo M with AVR who presents with worsening SOB/DOE concerning for CHF exacerbation, who is found to be febrile in addition to GPR  bacteremia  - agree with repeat blood culture today to see if we still have ongoing bacteremia - await sensitivities for gpr - continue with empiric regimen of vanco and cefepime - if he has further blood cx, would recommend to get TTE to see if endocarditis is contributing to worsening CHF  Cellulitis = covered with vanco/cefepime for now.  Elzie Rings [facility] [phone]

## 2013-09-22 NOTE — Progress Notes (Signed)
Patient Profile: 20 WM w/ PMH significant for severe AS and CAD, s/p AVR/CABG(SVG to PDA and PL)/MAZE(10/08/12), pAF, COPD who was admitted on 09/19/13 with volume overload and atrial fib/ flutter with RVR.   2D echo on 09/20/13 demonstrated low normal systolic function, with an EF of 50-55%.  Aortic valve: Structurally normal valve. Cusp separation was normal. Doppler: Transvalvular velocity was within the normal range. There was no stenosis. There was no regurgitation. Valve area (Vmax): 1.47 cm^2. Indexed valve area (Vmax): 0.64 cm^2/m^2. Peak gradient (S): 17 mm Hg.  Also with FUO. W/u pending.    Subjective: Continues to feel SOB. [place] increased from 3 to 4L/min. Denies chest pain and palpitations. He endorsed night sweats last PM but denies productive cough and dysuria.   Objective: Vital signs in last 24 hours: Temp:  [99.4 F (37.4 C)-102.3 F (39.1 C)] 99.4 F (37.4 C) (06/01 0540) Pulse Rate:  [97-103] 100 (06/01 0352) Resp:  [18-20] 20 (06/01 0352) BP: (112-137)/(72-75) 112/72 mmHg (06/01 0352) SpO2:  [91 %-94 %] 93 % (06/01 0540) Weight:  [234 lb 1.6 oz (106.187 kg)] 234 lb 1.6 oz (106.187 kg) (06/01 0352) Last BM Date: 09/21/13  Intake/Output from previous day: 05/31 0701 - 06/01 0700 In: 480 [P.O.:480] Out: 820 [Urine:820] Intake/Output this shift:    Medications Current Facility-Administered Medications  Medication Dose Route Frequency Provider Last Rate Last Dose  . 0.9 %  sodium chloride infusion  250 mL Intravenous PRN Lamar Sprinkles, MD      . acetaminophen (TYLENOL) tablet 650 mg  650 mg Oral Q6H PRN Jules Husbands, MD   650 mg at 09/22/13 0359  . amiodarone (PACERONE) tablet 200 mg  200 mg Oral BID Lamar Sprinkles, MD   200 mg at 09/21/13 2111  . aspirin EC tablet 81 mg  81 mg Oral Daily Lamar Sprinkles, MD   81 mg at 09/21/13 1020  . carvedilol (COREG) tablet 12.5 mg  12.5 mg Oral BID WC Lamar Sprinkles, MD   12.5 mg at 09/21/13 1733  . digoxin (LANOXIN) tablet  0.125 mg  0.125 mg Oral Daily Lamar Sprinkles, MD   0.125 mg at 09/21/13 1020  . furosemide (LASIX) injection 60 mg  60 mg Intravenous BID Evans Lance, MD   60 mg at 09/21/13 1732  . heparin ADULT infusion 100 units/mL (25000 units/250 mL)  1,400 Units/hr Intravenous Continuous Arman Bogus, [facility] 14 mL/hr at 09/21/13 1207 1,400 Units/hr at 09/21/13 1207  . pneumococcal 23 valent vaccine (PNU-IMMUNE) injection 0.5 mL  0.5 mL Intramuscular Tomorrow-1000 Lorretta Harp, MD      . potassium chloride SA (K-DUR,KLOR-CON) CR tablet 20 mEq  20 mEq Oral BID Lamar Sprinkles, MD   20 mEq at 09/21/13 2111  . simvastatin (ZOCOR) tablet 20 mg  20 mg Oral QHS Lamar Sprinkles, MD   20 mg at 09/21/13 2111  . sodium chloride 0.9 % injection 3 mL  3 mL Intravenous Q12H Lamar Sprinkles, MD   3 mL at 09/20/13 0153  . sodium chloride 0.9 % injection 3 mL  3 mL Intravenous PRN Lamar Sprinkles, MD        PE: General appearance: alert, cooperative and no distress Neck: + JVD Lungs: diminished breath sounds bibasilar and wheezes bibasilar and expiratory Heart: regular rate and rhythm Extremities: 2+ bilateral LEE Pulses: 2+ and symmetric Skin: warm and dry Neurologic: Grossly normal  Lab Results:   Recent Labs  09/20/13 1550 09/21/13 0405 09/22/13 0450  WBC 4.6 5.4  5.0  HGB 10.8* 10.8* 11.0*  HCT 37.0* 36.7* 36.6*  PLT 185 182 177   BMET  Recent Labs  09/20/13 0054 09/21/13 0405 09/22/13 0450  NA 139 139 139  K 4.2 4.5 4.8  CL 98 95* 96  CO2 32 36* 33*  GLUCOSE 108* 109* 120*  BUN 18 24* 26*  CREATININE 1.41* 1.61* 1.78*  CALCIUM 8.7 8.7 8.4   PT/INR  Recent Labs  09/19/13 1941  LABPROT 13.4  INR 1.04   Cholesterol No results found for this basename: CHOL,  in the last 72 hours  Cardiac Panel (last 3 results) No results found for this basename: CKTOTAL, CKMB, TROPONINI, RELINDX,  in the last 72 hours  Studies/Results: 2D echo 09/20/13 Study Conclusions  - Left  ventricle: The cavity size was normal. Wall thickness was increased in a pattern of mild LVH. Systolic function was normal. The estimated ejection fraction was in the range of 50% to 55%. - Left atrium: The atrium was severely dilated. - Right ventricle: The cavity size was mildly dilated. Systolic function was mildly reduced. - Right atrium: The atrium was severely dilated.  Filed Weights   09/20/13 0008 09/21/13 0356 09/22/13 0352  Weight: 231 lb 7.7 oz (105 kg) 232 lb 4.8 oz (105.371 kg) 234 lb 1.6 oz (106.187 kg)   Assessment/Plan  Active Problems:   Atrial fibrillation with RVR   Acute decompensated heart failure   Fever of unknown origin (FUO)   COPD (chronic obstructive pulmonary disease)  1. Acute Decompensated Heart Failure: BNP on admit was 3,530. EF low normal at 50-55%. He has diuresed 2.6L since admit, but continues to be volume overloaded. Continue with IV Lasix. Continue daily weights. Strict I/Os and low sodium diet. Monitor renal function, electrolytes and BP closely. Continue BB.   2. Afib/Flutter w/ RVR: Resting HR currently in the 90s. BP stable. Continue Amiodarone, Corge and digoxin. Continue IV heparin. ? Change to oral anticoagulation. CHA2DS2-VASc score > 2.   3. FUO: Most recent temp 102.3. WBC normal. Blood cultures so far are negative for growth. UA negative for UTI. CXR w/o infiltrate. Continue to follow blood cultures and monitor temp closely.   4. CAD: stable. Denies chest pain. EF normal. Continue ASA, BB and statin.   5. AVR : s/p pericardial tissue valve replacement. Valve appears stable by most recent echo.   6. COPD: physical exam positive for decreased breath sounds and expiratory wheezing. O2 requirements have increased to 4L/min of supplemental O2 via [place]. He will likely benefit from breathing treatments. Recommend Xopenex vs albuterol to prevent reflex tachycardia.    LOS: 3 days    Brittainy M. Ladoris Gene 09/22/2013 8:37 AM  Personally  seen and examined. Agree with above. Please see my note for details. I have consulted [facility] for evaluation fever. Candee Furbish, MD

## 2013-09-22 NOTE — Progress Notes (Signed)
Discussed with pt about drinking habit; pt states he drinks 4 beers daily; he drinks with friends at a club and will sometimes have an additional beer at home; daughter called concerned about pts drinking habits; notified MD.  Carollee Sires, RN

## 2013-09-22 NOTE — Progress Notes (Signed)
Blood culture x  2  from 5/29 growing gram-positive rods. I will start him on empiric IV vancomycin and cefepime. D/w Dr Graylon Good with ID. Will repeat blood cx.will d/w primary team about obtaining a TEE to evaluate for any vegetation.

## 2013-09-22 NOTE — Progress Notes (Signed)
Heart Failure Navigator Consult Note  Presentation: [name] is a 73M AS s/p AVR/CABG(SVG to PDA and PL), MAZE(10/08/12), pAF, COPD who is admitted with volume overload.  [name] reports he has gained 60lbs over the past 6 months. He endorses severe orthopnea that has required him to sleep in a chair for the past 3-4 months. He notes abdominal distention and LE edema with areas of weeping. He ambulates with a walker and can only move a few steps until he must take a break due to DOE. No chest pain, palpitations, or cough. Denies dietary indiscretion or medication non-adherence. He does note that due to insurance issues, he has not taken oral anticoagulation (apixiban) since 03/06/13. He has not had issues tolerating AC.  The patient's daughter came to town and saw her father in this condition and insisted he come to the hospital. In the ED, [name] was noted to be hemodynamically stable, but saturating 87% on RA. He was given IV lasix and admitted to cardiology.  Patient and daughter ask that we address long standing lower back pain during this admission as it is a major limiting factor for ambulation.   Past Medical History  Diagnosis Date  . Hypertension   . Gout   . Aortic stenosis     echo 06/19/08 with nomal LV function, moderate concentric LVH moderate aortic stenosis area 0.99 cm squared, peak gradient of 50 and mean of 31  . Atrial fibrillation   . Hyperlipidemia   . Atrial fibrillation with RVR, was in atrial fib in 04/2011 09/16/2012  . Tobacco use 09/16/2012  . Heart murmur   . Acute combined systolic and diastolic CHF, NYHA class 3 -- previous normal LV function and moderate AS- re-evaluate; EF now 30-35% with regional [facility] 09/16/2012  . Aortic stenosis, severe 09/23/2012  . CAD (coronary artery disease), single vessel disease 09/23/2012  . Cellulitis, lower extremity- treated 09/23/2012  . PAD (peripheral artery disease), decreased bil. ABIs 09/23/2012  . Gout flare. 09/29/12,  Lt knee, improved with colchicine. 09/30/2012  . H/O aortic valve replacement   . Congestive heart failure     History   Social History  . Marital Status: Divorced    Spouse Name: N/A    Number of Children: N/A  . Years of Education: N/A   Occupational History  . retired    Social History Main Topics  . Smoking status: Former Smoker -- 1.00 packs/day for 43 years    Types: Cigarettes    Quit date: 09/12/2012  . Smokeless tobacco: Never Used     Comment: More than 43 + pack years as he smoked up to 2 1/2 ppd for many years. Had a period of cessation after femoral stent.  . Alcohol Use: Yes     Comment: social  . Drug Use: No  . Sexual Activity: None   Other Topics Concern  . None   Social History Narrative  . None    ECHO:Study Conclusions--09/20/13 - Left ventricle: The cavity size was normal. Wall thickness was increased in a pattern of mild LVH. Systolic function was normal. The estimated ejection fraction was in the range of 50% to 55%. - Left atrium: The atrium was severely dilated. - Right ventricle: The cavity size was mildly dilated. Systolic function was mildly reduced. - Right atrium: The atrium was severely dilated.    BNP    Component Value Date/Time   PROBNP 3530.0* 09/19/2013 1941    Education Assessment and Provision:  Detailed  education and instructions provided on heart failure disease management including the following:  Signs and symptoms of Heart Failure When to call the physician Importance of daily weights Low sodium diet  Fluid restriction Medication management Anticipated future follow-up appointments  Patient education given on each of the above topics.  Patient acknowledges understanding and acceptance of all instructions.  [name] lives home alone.  He has a brother that lives in [place] and daughter that lives in [place] , [place].  He says that he does not own a scale however says that he can get one.  He does all of his own  cooking and we spent some time reviewing low sodium diet.  He admits to sometimes not affording his medications.  He is primarily concerned with his hip and lower back pain and says that he "does not want it to be forgotten about".  He is visibly SOB with our conversation.  Education Materials:  "Living Better With Heart Failure" Booklet, Daily Weight Tracker Tool    High Risk Criteria for Readmission and/or Poor Patient Outcomes:   EF <30%- No  2 or more admissions in 6 months- No  Difficult social situation- Yes --lives alone with limited other support  Demonstrates medication noncompliance-No    Barriers of Care:  Knowledge of HF , compliance, finances  Discharge Planning:  Plans to discharge to home alone--would benefit from [facility] for symptom assessment and ongoing education as well compliance reinforcement.  He would also benefit from a quick outpatient follow-up.

## 2013-09-22 NOTE — Consult Note (Addendum)
WOC wound consult note Reason for Consult: Consult requested for weeping areas to legs.  Pt previously had cellulitis which has greatly improved at this time, he states.  Wound type: Right and left legs with generalized edema and erythremia.  Previously both legs were weeping.   Right leg does not have any drainage or open wounds at this time.  Few patchy areas of dry scabs.   Measurement: Left calf with same appearance as left.  2 open areas of partial thickness wounds; 2X.5X.1cm and 1.5X.5X.1cm.  Both with yellow wound beds and mod amt yellow drainage, no odor. Dressing procedure/placement/frequency: Foam dressing to absorb drainage and promote healing. Please re-consult if further assistance is needed.  Thank-you,  Julien Girt MSN, [place], [place], [place], [place]

## 2013-09-22 NOTE — Progress Notes (Signed)
Nutrition Consult/Brief Note  RD consulted for CHF diet education.  Provided "Heart Failure Nutrition Therapy" handout from the Academy of [employer]. Patient opted to review information on his own time.  Body mass index is 33.59 kg/(m^2). Pt meets criteria for Obesity Class I based on current BMI.  Current diet order is Heart Healthy, average consumption is 70% of meals at this time.  Pt does not care for the hospital food.  Labs and medications reviewed. No further nutrition interventions warranted at this time. If additional nutrition issues arise, please re-consult RD.   Arthur Holms, RD, LDN Pager #: [phone] After-Hours Pager #: [phone]

## 2013-09-22 NOTE — Progress Notes (Signed)
Patient ID: QUINTO TIPPY, male   DOB: 1951-03-14, 63 y.o.   MRN: [id number]  Patient Name: [name] Date of Encounter: 09/22/2013      Active Problems:   Atrial fibrillation with RVR   Acute decompensated heart failure    SUBJECTIVE  Dyspnea still present. No chest pain. Had a fever last night again. Felt night sweats.   CURRENT MEDS . amiodarone  200 mg Oral BID  . aspirin EC  81 mg Oral Daily  . carvedilol  12.5 mg Oral BID WC  . digoxin  0.125 mg Oral Daily  . furosemide  60 mg Intravenous BID  . pneumococcal 23 valent vaccine  0.5 mL Intramuscular Tomorrow-1000  . potassium chloride SA  20 mEq Oral BID  . simvastatin  20 mg Oral QHS  . sodium chloride  3 mL Intravenous Q12H    OBJECTIVE  Filed Vitals:   09/21/13 1347 09/21/13 2025 09/22/13 0352 09/22/13 0540  BP: 112/74 137/75 112/72   Pulse: 103 97 100   Temp: 100.2 F (37.9 C) 99.9 F (37.7 C) 102.3 F (39.1 C) 99.4 F (37.4 C)  TempSrc: Oral Oral Oral Oral  Resp: 18 19 20    Height:      Weight:   234 lb 1.6 oz (106.187 kg)   SpO2: 94% 93% 91% 93%    Intake/Output Summary (Last 24 hours) at 09/22/13 0834 Last data filed at 09/22/13 0400  Gross per 24 hour  Intake    480 ml  Output    820 ml  Net   -340 ml   Filed Weights   09/20/13 0008 09/21/13 0356 09/22/13 0352  Weight: 231 lb 7.7 oz (105 kg) 232 lb 4.8 oz (105.371 kg) 234 lb 1.6 oz (106.187 kg)    PHYSICAL EXAM  General: Pleasant, NAD. Neuro: Alert and oriented X 3. Moves all extremities spontaneously. Psych: Normal affect. HEENT:  Normal  Neck: Supple without bruits or JVD. Lungs:  Resp regular and unlabored, CTA. Heart: Ireg tachy no s3, s4, or murmurs. Abdomen: Soft, non-tender, distended, BS + x 4. Obese Extremities: No clubbing, cyanosis . DP/PT/Radials 2+ and equal bilaterally. 3+ woody edema, weeping.   Accessory Clinical Findings  CBC  Recent Labs  09/21/13 0405 09/22/13 0450  WBC 5.4 5.0  HGB 10.8* 11.0*  HCT  36.7* 36.6*  MCV 89.3 89.5  PLT 182 315   Basic Metabolic Panel  Recent Labs  09/21/13 0405 09/22/13 0450  NA 139 139  K 4.5 4.8  CL 95* 96  CO2 36* 33*  GLUCOSE 109* 120*  BUN 24* 26*  CREATININE 1.61* 1.78*  CALCIUM 8.7 8.4   Liver Function Tests  Recent Labs  09/19/13 2009  AST 50*  ALT 46  ALKPHOS 65  BILITOT 0.4  PROT 7.6  ALBUMIN 2.7*   TELE  Atrial fib with an RVR and atrial flutter with an RVR   Radiology/Studies  Dg Chest Port 1 View  09/19/2013   IMPRESSION: Stable cardiomegaly with increasing central pulmonary vasculature congestion/ interstitial prominence concerning for pulmonary edema without pleural effusion or focal consolidation.   Electronically Signed   By: Elon Alas   On: 09/19/2013 20:10    ASSESSMENT AND PLAN 1. Acute on chronic systolic and diastolic heart failure 2. Atrial fib/flutter with a RVR 3. Obesity 4. COPD 5. Fever   Rec: will increase IV lasix and add metolazone, and work on rate control. His volume overload is refractory to diuresis despite normal LV  function by echo. RV down mildly.  Blood and urine cultures NGTD. ? Fever from wounds on legs. ?need for abx. Consulting medicine. Wound care seeing. Creat worsening. Watch closely.    09/22/2013 8:34 AM

## 2013-09-22 NOTE — Progress Notes (Signed)
ANTICOAGULATION CONSULT NOTE - Follow Up Consult  Pharmacy Consult for heparin Indication: atrial fibrillation  No Known Allergies  Patient Measurements: Height: 5\' 10"  (177.8 cm) Weight: 234 lb 1.6 oz (106.187 kg) IBW/kg (Calculated) : 73 Heparin Dosing Weight: 95 kg  Vital Signs: Temp: 99.4 F (37.4 C) (06/01 0540) Temp src: Oral (06/01 0540) BP: 112/72 mmHg (06/01 0352) Pulse Rate: 100 (06/01 0352)  Labs:  Recent Labs  09/19/13 1941  09/20/13 0054  09/20/13 1550 09/21/13 0405 09/22/13 0450  HGB 11.2*  --   --   --  10.8* 10.8* 11.0*  HCT 35.4*  --   --   --  37.0* 36.7* 36.6*  PLT 178  --   --   --  185 182 177  LABPROT 13.4  --   --   --   --   --   --   INR 1.04  --   --   --   --   --   --   HEPARINUNFRC  --   --   --   < > 0.43 0.44 0.61  CREATININE  --   < > 1.41*  --   --  1.61* 1.78*  < > = values in this interval not displayed.  Estimated Creatinine Clearance: 51.2 ml/min (by C-G formula based on Cr of 1.78).   Assessment: Patient is a 63 y.o Mon heparin for afib.  Heparin level is therapeutic this morning at 0.61.  No bleeding noted.  Goal of Therapy:  Heparin level 0.3-0.7 units/ml Monitor platelets by anticoagulation protocol: Yes   Plan:  1) continue heparin drip at 1400 units/hr  Chrstopher Malenfant P Karanvir Balderston 09/22/2013,8:56 AM

## 2013-09-23 DIAGNOSIS — F17200 Nicotine dependence, unspecified, uncomplicated: Secondary | ICD-10-CM

## 2013-09-23 DIAGNOSIS — I5041 Acute combined systolic (congestive) and diastolic (congestive) heart failure: Secondary | ICD-10-CM

## 2013-09-23 DIAGNOSIS — I10 Essential (primary) hypertension: Secondary | ICD-10-CM

## 2013-09-23 DIAGNOSIS — J9600 Acute respiratory failure, unspecified whether with hypoxia or hypercapnia: Secondary | ICD-10-CM

## 2013-09-23 LAB — CBC
HCT: 37 % — ABNORMAL LOW (ref 39.0–52.0)
Hemoglobin: 11 g/dL — ABNORMAL LOW (ref 13.0–17.0)
MCH: 26.6 pg (ref 26.0–34.0)
MCHC: 29.7 g/dL — AB (ref 30.0–36.0)
MCV: 89.4 fL (ref 78.0–100.0)
PLATELETS: 170 10*3/uL (ref 150–400)
RBC: 4.14 MIL/uL — ABNORMAL LOW (ref 4.22–5.81)
RDW: 17.1 % — AB (ref 11.5–15.5)
WBC: 5.1 10*3/uL (ref 4.0–10.5)

## 2013-09-23 LAB — HEPARIN LEVEL (UNFRACTIONATED): Heparin Unfractionated: 0.65 IU/mL (ref 0.30–0.70)

## 2013-09-23 MED ORDER — CARVEDILOL 25 MG PO TABS
25.0000 mg | ORAL_TABLET | Freq: Two times a day (BID) | ORAL | Status: DC
  Administered 2013-09-23 – 2013-09-30 (×15): 25 mg via ORAL
  Filled 2013-09-23 (×17): qty 1

## 2013-09-23 MED ORDER — SODIUM CHLORIDE 0.9 % IV SOLN
2.0000 g | Freq: Four times a day (QID) | INTRAVENOUS | Status: DC
  Administered 2013-09-23 – 2013-09-30 (×25): 2 g via INTRAVENOUS
  Filled 2013-09-23 (×33): qty 2000

## 2013-09-23 MED ORDER — APIXABAN 5 MG PO TABS
5.0000 mg | ORAL_TABLET | Freq: Two times a day (BID) | ORAL | Status: DC
  Administered 2013-09-23 – 2013-09-30 (×16): 5 mg via ORAL
  Filled 2013-09-23 (×16): qty 1

## 2013-09-23 MED ORDER — ATORVASTATIN CALCIUM 20 MG PO TABS
20.0000 mg | ORAL_TABLET | Freq: Every day | ORAL | Status: DC
  Administered 2013-09-23 – 2013-09-30 (×8): 20 mg via ORAL
  Filled 2013-09-23 (×8): qty 1

## 2013-09-23 NOTE — Progress Notes (Addendum)
[facility] for Infectious Disease    Date of Admission:  09/19/2013   Total days of antibiotics 2        Day 2 cefepime        Day 2 vancomycin           ID: [name] is a 63 y.o. 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 Family reports patient complained of back pain. He denies any recent consumption of hummus, ice cream or unpasteurized cheese  Spoke with micro lab: all 3 sets of blood cx c/w listeria  Medications:  . amiodarone  200 mg Oral BID  . apixaban  5 mg Oral BID  . aspirin EC  81 mg Oral Daily  . atorvastatin  20 mg Oral q1800  . carvedilol  25 mg Oral BID WC  . ceFEPime (MAXIPIME) IV  2 g Intravenous Q24H  . digoxin  0.125 mg Oral Daily  . folic acid  1 mg Oral Daily  . furosemide  80 mg Intravenous TID  . metolazone  5 mg Oral Daily  . multivitamin with minerals  1 tablet Oral Daily  . potassium chloride SA  20 mEq Oral BID  . sodium chloride  3 mL Intravenous Q12H  . thiamine  100 mg Oral Daily   Or  . thiamine  100 mg Intravenous Daily  . vancomycin  1,500 mg Intravenous Q24H    Objective: Vital signs in last 24 hours: Temp:  [98.8 F (37.1 C)-99.5 F (37.5 C)] 99 F (37.2 C) (06/02 1300) Pulse Rate:  [101-105] 105 (06/02 1300) Resp:  [18] 18 (06/02 1300) BP: (103-132)/(59-78) 129/60 mmHg (06/02 1300) SpO2:  [94 %-100 %] 94 % (06/02 1300) Weight:  [228 lb 2.8 oz (103.5 kg)] 228 lb 2.8 oz (103.5 kg) (06/02 0455)  General: Elderly obese male sitting up in chair in no acute distress.  HEENT: No pallor, no icterus, no oral thrush, no cervical lymphadenopathy  Heart: S1 and S2 irregularly [id number],7/7 systolic murmur  Lungs: Diminished breath sounds  in bilateral lung bases, no crackles, rhonchi or wheeze  Abdomen: Soft, nontender, nondistended, positive bowel sounds.  Extremities: +2 edema bilaterally LE  Skin: Cellulitis to distal pretibial area of left leg, and posterior right leg, weeping over left leg, no obvious bruising and discharged. No back tenderness. No ankle or knee tenderness or swelling, right calf tenderness to pressure.  Neuro: AAOX3, CN 2-12 intact    Lab Results  Recent Labs  09/21/13 0405 09/22/13 0450 09/23/13 0349  WBC 5.4 5.0 5.1  HGB 10.8* 11.0* 11.0*  HCT 36.7* 36.6* 37.0*  NA 139 139  --   K 4.5 4.8  --   CL 95* 96  --   CO2 36* 33*  --   BUN 24* 26*  --   CREATININE 1.61* 1.78*  --    Liver Panel No results found for this basename: PROT, ALBUMIN, AST, ALT, ALKPHOS, BILITOT, BILIDIR, IBILI,  in the last 72 hours Sedimentation Rate  Recent Labs  09/22/13 0450  ESRSEDRATE 110*   C-Reactive Protein  Recent Labs  09/22/13 1356  CRP 4.5*    Microbiology: 5/31 blood cx 2/2 listeria 6/1 blood cx 1/  2 listeria  Studies/Results: Dg Chest Port 1 View  09/22/2013   CLINICAL DATA:  Shortness of breath, fever, evaluate for pneumonia  EXAM: PORTABLE CHEST - 1 VIEW  COMPARISON:  09/19/2013; 11/11/2012; 10/11/2012  FINDINGS: Grossly unchanged enlarged cardiac silhouette and mediastinal contours with atherosclerotic plaque within the thoracic aorta. Post median sternotomy CABG. The pulmonary vasculature remains indistinct with cephalization of flow. Kerley B-lines are identified within the peripheral aspect of the left mid and lower lung. No new focal airspace opacities. No definite pleural effusion pneumothorax. Unchanged bones.  IMPRESSION: Grossly unchanged findings most suggestive of pulmonary edema, though note, atypical infection could have a similar appearance. Clinical correlation is advised. Further evaluation with a PA and lateral chest radiograph may be obtained as clinically indicated.    Electronically Signed   By: Sandi Mariscal M.D.   On: 09/22/2013 11:29   Assessment/Plan: Listeria bacteremia = will need to review if he has had any contaminated ice cream, hummus or fresh cheeses. Will switch his antibiotics to ampicillin 2gm IV Q4hr and discontinue vancomycin/cefepime. RF is alcoholism.  Listeria can cause endocarditis as well as osteomyelitis. Agree with primary team to do TEE as well as mri of spine.   Discussed with Dr. Eliseo Squires plan for imaging, TEE with Dr. Marlou Porch on Thursday after diuresis. Likely treat for extended period of time if MRI or TEE +  [facility] Cell: [phone] Pager: [phone]  09/23/2013, 2:52 PM

## 2013-09-23 NOTE — Discharge Instructions (Addendum)
Heart Healthy low sodium diet  Weigh daily Call [phone] if weight climbs more than 3 pounds in a day or 5 pounds in a week. No salt to very little salt in your diet.  No more than 2000 mg in a day. Call if increased shortness of breath or increased swelling.   no alcohol  Stop the amiodarone in 5 days, last dose on the 14th pm  Continue antibiotic until complete.  Call if decrease in appetite.  Home health will draw labs on you.  Tuesday and Thursday of this week and next. BMP.  Dig level next Tuesday.  On the prednisone you will take 3 tabs tomorrow and then 2 tabs on Thursday, then 1 tab on Friday and 1 tab on 08-03-2022.  On Amiodarone we deceased dose to 100 mg twice a day but to stop in 5 days.                  Information on my medicine - ELIQUIS (apixaban)  This medication education was reviewed with me or my healthcare representative as part of my discharge preparation.  The pharmacist that spoke with me during my hospital stay was:  [place], RPH  Why was Eliquis prescribed for you? Eliquis was prescribed for you to reduce the risk of a blood clot forming that can cause a stroke if you have a medical condition called atrial fibrillation (a type of irregular heartbeat).  What do You need to know about Eliquis ? Take your Eliquis TWICE DAILY - one tablet in the morning and one tablet in the evening with or without food. If you have difficulty swallowing the tablet whole please discuss with your pharmacist how to take the medication safely.  Take Eliquis exactly as prescribed by your doctor and DO NOT stop taking Eliquis without talking to the doctor who prescribed the medication.  Stopping may increase your risk of developing a stroke.  Refill your prescription before you run out.  After discharge, you should have regular check-up appointments with your healthcare provider that is prescribing your Eliquis.  In the future your dose may need to be changed if  your kidney function or weight changes by a significant amount or as you get older.  What do you do if you miss a dose? If you miss a dose, take it as soon as you remember on the same day and resume taking twice daily.  Do not take more than one dose of ELIQUIS at the same time to make up a missed dose.  Important Safety Information A possible side effect of Eliquis is bleeding. You should call your healthcare provider right away if you experience any of the following:   Bleeding from an injury or your nose that does not stop.   Unusual colored urine (red or dark brown) or unusual colored stools (red or black).   Unusual bruising for unknown reasons.   A serious fall or if you hit your head (even if there is no bleeding).  Some medicines may interact with Eliquis and might increase your risk of bleeding or clotting while on Eliquis. To help avoid this, consult your healthcare provider or pharmacist prior to using any new prescription or non-prescription medications, including herbals, vitamins, non-steroidal anti-inflammatory drugs (NSAIDs) and supplements.  This website has more information on Eliquis (apixaban): [email or web address].

## 2013-09-23 NOTE — Progress Notes (Addendum)
PROGRESS NOTE  [name] FYB:[id number] DOB: 31-Jan-1951 DOA: 09/19/2013 PCP: No PCP Per Patient  Assessment/Plan: Gram + rod bacteremia -Given right calf tenderness and poor mobility I will check Doppler of his lower extremity to rule out DVT.  -abx per ID -? Need for TEE- will get after further diuresis spoke with cards  Back pain -MRI to r/o discitis  Alcohol abuse -CIWA  Atrial fibrillation with RVR  Currently rate controlled on amiodarone, carvedilol. His CHADS2 score is 2. He has been started on IV heparin by the primary team we'll plan on oral anticoagulation.   Acute decompensated heart failure  On IV Lasix 80 mg 3 times a day. On aspirin and digoxin as well. Has negative balance.   COPD (chronic obstructive pulmonary disease)  No signs of exacerbation. Continue O2 via nasal cannula and albuterol nebs   CAD  On aspirin, statin and carvedilol   Deconditioning  PT eval   Chronic Left leg cellulitis and peripheral vascular disease  No signs of acute infection and as per pt the leg appears much better now. Dressing as per wound care consult   Acute kidney injury  ?cardiorenal syndrome and worsened renal fn while on high dose Lasix. Has good urine output. Continue to monitor   Code Status: full Family Communication: patient/daughter Disposition Plan:    HPI/Subjective: No overnight events Feeling better  Objective: Filed Vitals:   09/23/13 0455  BP: 132/74  Pulse: 105  Temp: 99.5 F (37.5 C)  Resp: 18    Intake/Output Summary (Last 24 hours) at 09/23/13 1040 Last data filed at 09/23/13 0700  Gross per 24 hour  Intake    470 ml  Output   2151 ml  Net  -1681 ml   Filed Weights   09/21/13 0356 09/22/13 0352 09/23/13 0455  Weight: 105.371 kg (232 lb 4.8 oz) 106.187 kg (234 lb 1.6 oz) 103.5 kg (228 lb 2.8 oz)    Exam:   General:  Pleasant/cooperative  Cardiovascular: irr  Respiratory: decrease breath sounds  Abdomen: +Bs,  soft  Musculoskeletal: moves all 4 ext   Data Reviewed: Basic Metabolic Panel:  Recent Labs Lab 09/19/13 2009 09/20/13 0054 09/21/13 0405 09/22/13 0450  NA 136* 139 139 139  K 4.4 4.2 4.5 4.8  CL 96 98 95* 96  CO2 29 32 36* 33*  GLUCOSE 116* 108* 109* 120*  BUN 18 18 24* 26*  CREATININE 1.41* 1.41* 1.61* 1.78*  CALCIUM 8.6 8.7 8.7 8.4   Liver Function Tests:  Recent Labs Lab 09/19/13 2009  AST 50*  ALT 46  ALKPHOS 65  BILITOT 0.4  PROT 7.6  ALBUMIN 2.7*   No results found for this basename: LIPASE, AMYLASE,  in the last 168 hours No results found for this basename: AMMONIA,  in the last 168 hours CBC:  Recent Labs Lab 09/19/13 1941 09/20/13 1550 09/21/13 0405 09/22/13 0450 09/23/13 0349  WBC 4.8 4.6 5.4 5.0 5.1  HGB 11.2* 10.8* 10.8* 11.0* 11.0*  HCT 35.4* 37.0* 36.7* 36.6* 37.0*  MCV 86.6 89.8 89.3 89.5 89.4  PLT 178 185 182 177 170   Cardiac Enzymes: No results found for this basename: CKTOTAL, CKMB, CKMBINDEX, TROPONINI,  in the last 168 hours BNP (last 3 results)  Recent Labs  09/28/12 0405 10/01/12 0415 09/19/13 1941  PROBNP 12365.0* 11193.0* 3530.0*   CBG: No results found for this basename: GLUCAP,  in the last 168 hours  Recent Results (from the past 240 hour(s))  CULTURE, BLOOD (  ROUTINE X 2)     Status: None   Collection Time    09/21/13 10:11 AM      Result Value Ref Range Status   Specimen Description BLOOD RIGHT ARM   Final   Special Requests BOTTLES DRAWN AEROBIC AND ANAEROBIC 5CC   Final   Culture  Setup Time     Final   Value: 09/21/2013 18:11     Performed at [facility]   Culture     Final   Value: GRAM POSITIVE RODS     Note: Gram Stain Report Called to,Read Back By and Verified With: HEATHER B BY INGRAM A 09/22/13 1205PM     Performed at [facility]   Report Status PENDING   Incomplete  CULTURE, BLOOD (ROUTINE X 2)     Status: None   Collection Time    09/21/13 10:16 AM      Result Value Ref Range  Status   Specimen Description BLOOD RIGHT ARM   Final   Special Requests BOTTLES DRAWN AEROBIC AND ANAEROBIC 5CC   Final   Culture  Setup Time     Final   Value: 09/21/2013 18:11     Performed at [facility]   Culture     Final   Value: GRAM POSITIVE RODS     Note: Gram Stain Report Called to,Read Back By and Verified With: HEATHER B BY INGRAM A 09/22/13 1205PM     Performed at [facility]   Report Status PENDING   Incomplete  URINE CULTURE     Status: None   Collection Time    09/21/13 10:24 AM      Result Value Ref Range Status   Specimen Description URINE, RANDOM   Final   Special Requests Normal   Final   Culture  Setup Time     Final   Value: 09/21/2013 18:41     Performed at [place] [facility]     Final   Value: 25,000 COLONIES/ML     Performed at [facility]   Culture     Final   Value: Multiple bacterial morphotypes present, none predominant. Suggest appropriate recollection if clinically indicated.     Performed at [facility]   Report Status 09/22/2013 FINAL   Final  CULTURE, BLOOD (ROUTINE X 2)     Status: None   Collection Time    09/22/13  1:56 PM      Result Value Ref Range Status   Specimen Description BLOOD LEFT HAND   Final   Special Requests     Final   Value: BOTTLES DRAWN AEROBIC AND ANAEROBIC 10CC BLUE  8CC RED   Culture  Setup Time     Final   Value: 09/22/2013 18:23     Performed at [facility]   Culture     Final   Value:        BLOOD CULTURE RECEIVED NO GROWTH TO DATE CULTURE WILL BE HELD FOR 5 DAYS BEFORE ISSUING A FINAL NEGATIVE REPORT     Performed at [facility]   Report Status PENDING   Incomplete  CULTURE, BLOOD (ROUTINE X 2)     Status: None   Collection Time    09/22/13  2:02 PM      Result Value Ref Range Status   Specimen Description BLOOD RIGHT HAND   Final   Special Requests BOTTLES DRAWN AEROBIC ONLY 10CC   Final  Culture  Setup Time     Final   Value: 09/22/2013  18:23     Performed at [facility]   Culture     Final   Value:        BLOOD CULTURE RECEIVED NO GROWTH TO DATE CULTURE WILL BE HELD FOR 5 DAYS BEFORE ISSUING A FINAL NEGATIVE REPORT     Performed at [facility]   Report Status PENDING   Incomplete     Studies: Dg Chest Port 1 View  09/22/2013   CLINICAL DATA:  Shortness of breath, fever, evaluate for pneumonia  EXAM: PORTABLE CHEST - 1 VIEW  COMPARISON:  09/19/2013; 11/11/2012; 10/11/2012  FINDINGS: Grossly unchanged enlarged cardiac silhouette and mediastinal contours with atherosclerotic plaque within the thoracic aorta. Post median sternotomy CABG. The pulmonary vasculature remains indistinct with cephalization of flow. Kerley B-lines are identified within the peripheral aspect of the left mid and lower lung. No new focal airspace opacities. No definite pleural effusion pneumothorax. Unchanged bones.  IMPRESSION: Grossly unchanged findings most suggestive of pulmonary edema, though note, atypical infection could have a similar appearance. Clinical correlation is advised. Further evaluation with a PA and lateral chest radiograph may be obtained as clinically indicated.   Electronically Signed   By: Sandi Mariscal M.D.   On: 09/22/2013 11:29    Scheduled Meds: . amiodarone  200 mg Oral BID  . aspirin EC  81 mg Oral Daily  . atorvastatin  20 mg Oral q1800  . carvedilol  25 mg Oral BID WC  . ceFEPime (MAXIPIME) IV  2 g Intravenous Q24H  . digoxin  0.125 mg Oral Daily  . folic acid  1 mg Oral Daily  . furosemide  80 mg Intravenous TID  . metolazone  5 mg Oral Daily  . multivitamin with minerals  1 tablet Oral Daily  . pneumococcal 23 valent vaccine  0.5 mL Intramuscular Tomorrow-1000  . potassium chloride SA  20 mEq Oral BID  . sodium chloride  3 mL Intravenous Q12H  . thiamine  100 mg Oral Daily   Or  . thiamine  100 mg Intravenous Daily  . vancomycin  1,500 mg Intravenous Q24H   Continuous Infusions: . heparin 1,400  Units/hr (09/23/13 6160)   Antibiotics Given (last 72 hours)   Date/Time Action Medication Dose Rate   09/22/13 1456 Given   ceFEPIme (MAXIPIME) 2 g in dextrose 5 % 50 mL IVPB 2 g 100 mL/hr   09/22/13 1605 Given   vancomycin (VANCOCIN) 2,000 mg in sodium chloride 0.9 % 500 mL IVPB 2,000 mg 250 mL/hr      Principal Problem:   Acute decompensated heart failure Active Problems:   Atrial fibrillation with RVR   Fever of unknown origin (FUO)   COPD (chronic obstructive pulmonary disease)    Time spent: 35 min    [facility] Pager [phone]. If 7PM-7AM, please contact night-coverage at [email or web address], password [facility] 09/23/2013, 10:40 AM  LOS: 4 days

## 2013-09-23 NOTE — Progress Notes (Signed)
CRITICAL VALUE ALERT  Critical value received:  + Blood Cultures   Date of notification:  09/23/13  Time of notification:  19:22  Critical value read back:yes  Nurse who received alert:  Mar Daring   MD notified (1st page):  Carlyle Basques  Time of first page:  19:26  Responding MD:  Carlyle Basques  Time MD responded:  19:39

## 2013-09-23 NOTE — Progress Notes (Addendum)
63 y.o. male with past medical history of severe aortic stenosis s/p tissue AV replacement, atrial fibrillation, heart failure, hypertension, and coronary artery disease who presents with shortness of breath that has progressed over the last few months now with severe DOE. + orthopnea in addition to abdominal distention and lower leg swelling.  Also atrial fib with RVR.  He reports intermittent weeping of wounds of lower extremity due to le edema. on admit, he was found to be hypoxic on room air and put on 2 L nasal cannula. cxr showing pulmonary edema. He was admitted for heart failure exacerbation. He was afebrile on admit however has developed high fevers for the last 2 days of admit up to 102.3 yesterday. Inaddition, he had 2 sets of blood cx showing gpr. He denies any dental work, or diarrhea recently. No fever,chills,nightsweats at home. WBC not significantly elevated   Subjective: No chest pain, SOB continues  Objective: Vital signs in last 24 hours: Temp:  [98.8 F (37.1 C)-100.4 F (38 C)] 99.5 F (37.5 C) (06/02 0455) Pulse Rate:  [101-107] 105 (06/02 0455) Resp:  [18-20] 18 (06/02 0455) BP: (103-132)/(55-78) 132/74 mmHg (06/02 0455) SpO2:  [96 %-100 %] 97 % (06/02 0455) Weight:  [228 lb 2.8 oz (103.5 kg)] 228 lb 2.8 oz (103.5 kg) (06/02 0455) Weight change: -5 lb 14.8 oz (-2.687 kg) Last BM Date: 09/22/13 Intake/Output from previous day: -1801  (-4400 since admit) wt 228 down from 240 on admit 06/01 0701 - 06/02 0700 In: 60 [P.O.:470] Out: 2151 [Urine:2150; Stool:1] Intake/Output this shift:   PE: General:Pleasant affect, NAD Skin:Warm and dry, brisk capillary refill HEENT:normocephalic, sclera clear, mucus membranes moist Heart:irreg irreg without murmur, gallup, rub or click Lungs:clear without rales, rhonchi, or wheezes WCH:ENIDP, soft, non tender, + BS, do not palpate liver spleen or masses OEU:MPNTIRWE lower ext edema bit still 1-2+ to hips, Neuro:alert  and oriented, MAE, follows commands, + facial symmetry     Lab Results:  Recent Labs  09/22/13 0450 09/23/13 0349  WBC 5.0 5.1  HGB 11.0* 11.0*  HCT 36.6* 37.0*  PLT 177 170   BMET  Recent Labs  09/21/13 0405 09/22/13 0450  NA 139 139  K 4.5 4.8  CL 95* 96  CO2 36* 33*  GLUCOSE 109* 120*  BUN 24* 26*  CREATININE 1.61* 1.78*  CALCIUM 8.7 8.4   No results found for this basename: TROPONINI, CK, MB,  in the last 72 hours     Studies/Results: Dg Chest Port 1 View  09/22/2013   CLINICAL DATA:  Shortness of breath, fever, evaluate for pneumonia  EXAM: PORTABLE CHEST - 1 VIEW  COMPARISON:  09/19/2013; 11/11/2012; 10/11/2012  FINDINGS: Grossly unchanged enlarged cardiac silhouette and mediastinal contours with atherosclerotic plaque within the thoracic aorta. Post median sternotomy CABG. The pulmonary vasculature remains indistinct with cephalization of flow. Kerley B-lines are identified within the peripheral aspect of the left mid and lower lung. No new focal airspace opacities. No definite pleural effusion pneumothorax. Unchanged bones.  IMPRESSION: Grossly unchanged findings most suggestive of pulmonary edema, though note, atypical infection could have a similar appearance. Clinical correlation is advised. Further evaluation with a PA and lateral chest radiograph may be obtained as clinically indicated.   Electronically Signed   By: Sandi Mariscal M.D.   On: 09/22/2013 11:29    Medications: I have reviewed the patient's current medications. Scheduled Meds: . amiodarone  200 mg Oral BID  . aspirin EC  81  mg Oral Daily  . carvedilol  12.5 mg Oral BID WC  . ceFEPime (MAXIPIME) IV  2 g Intravenous Q24H  . digoxin  0.125 mg Oral Daily  . folic acid  1 mg Oral Daily  . furosemide  80 mg Intravenous TID  . metolazone  5 mg Oral Daily  . multivitamin with minerals  1 tablet Oral Daily  . pneumococcal 23 valent vaccine  0.5 mL Intramuscular Tomorrow-1000  . potassium chloride SA   20 mEq Oral BID  . simvastatin  20 mg Oral QHS  . sodium chloride  3 mL Intravenous Q12H  . thiamine  100 mg Oral Daily   Or  . thiamine  100 mg Intravenous Daily  . vancomycin  1,500 mg Intravenous Q24H   Continuous Infusions: . heparin 1,400 Units/hr (09/23/13 0312)   PRN Meds:.sodium chloride, acetaminophen, albuterol, LORazepam, LORazepam, sodium chloride  Assessment/Plan: Principal Problem:   Acute decompensated heart failure, improving with metolazone though still has some way to go. Active Problems:   Atrial fibrillation with RVR--HR 105 occ PVC-last DCCV in Nov.2014 (did not hold)   Fever of unknown origin (FUO)- on ABX, was seen by ID and IM   COPD (chronic obstructive pulmonary disease)   Anticoagulation on IV heparin-previously had been on eliquis   Cellulitis, chronic lower ext-dsg in place   Debilitation, has never increased his energy to walk without walker, add PT back while here   LOS: 4 days   Time spent with pt. :15 minutes. Cecilie Kicks  Nurse Practitioner Certified Pager [phone] or after 5pm and on weekends call [phone] 09/23/2013, 8:44 AM  7M AS s/p AVR/CABG(SVG to PDA and PL)/MAZE(10/08/12), pAF, COPD who is admitted with volume overload, fever.   Personally seen and examined. Agree with above. -changed simvastatin 20 to atorvastatin 20 (on amiodarone) -continue with aggressive IV lasix and metolazone (better output yesterday) -Appreciated TRH and ID teams. Vanc/cef. Leg wounds. Diagonal. Consider TEE to closely look at AVR.  -Will restart Eliquis and stop IV heparin. Was previously on Eliquis per history. Does have tissue aortic valve but this is not the reason for his afib. -Will increase coreg to 25 BID with hold parameters to improve rate control and optimize HF reg.  -In review of telephone notes, dyspnea was not improved following cardioversion in 11/14. He did not show up for his follow up with Dr. Gwenlyn Found after that. I do not think we will be very  successful at rhythm control strategy. Dr. Lovena Le rounded on him over the weekend and did not discuss rhythm control. He only mentioned rate control. If this is the case, we may want to DC amiodarone unless this is needed for adequate rate control. On the other hand, since he underwent MAZE last year and has been on amiodarone, could consider one more chance at rhythm control with TEE/CV soon. Postponing because of recent fevers. He would need TEE prior to CV because of break in anticoagulation.   Complex medical situation.    Candee Furbish, MD

## 2013-09-23 NOTE — Progress Notes (Addendum)
ANTICOAGULATION CONSULT NOTE - Follow Up Consult  Pharmacy Consult for heparin Indication: atrial fibrillation  No Known Allergies  Patient Measurements: Height: 5\' 10"  (177.8 cm) Weight: 228 lb 2.8 oz (103.5 kg) (b) IBW/kg (Calculated) : 73 Heparin Dosing Weight: 95kg  Vital Signs: Temp: 99.5 F (37.5 C) (06/02 0455) Temp src: Oral (06/02 0455) BP: 132/74 mmHg (06/02 0455) Pulse Rate: 105 (06/02 0455)  Labs:  Recent Labs  09/21/13 0405 09/22/13 0450 09/23/13 0349  HGB 10.8* 11.0* 11.0*  HCT 36.7* 36.6* 37.0*  PLT 182 177 170  HEPARINUNFRC 0.44 0.61 0.65  CREATININE 1.61* 1.78*  --     Estimated Creatinine Clearance: 50.5 ml/min (by C-G formula based on Cr of 1.78).  Assessment: Patient is a 63 y.o Mon heparin for Afib.  Heparin level remains at goal this morning with 0.65.  No bleeding documented.   Goal of Therapy:  Heparin level 0.3-0.7 units/ml Monitor platelets by anticoagulation protocol: Yes   Plan:  1) continue heparin drip at 1400 units/hr   Neithan Day P Trenace Coughlin 09/23/2013,8:43 AM  Adden:   Dr. Gillian Shields indicated that patient has "tissue aortic valve but this is not the reason for his afib."  To transition heparin to apixaban today. D/c heparin and start apixaban 5mg  PO bid.

## 2013-09-24 ENCOUNTER — Inpatient Hospital Stay (HOSPITAL_COMMUNITY): Payer: Medicaid Other

## 2013-09-24 DIAGNOSIS — M7989 Other specified soft tissue disorders: Secondary | ICD-10-CM

## 2013-09-24 DIAGNOSIS — N189 Chronic kidney disease, unspecified: Secondary | ICD-10-CM

## 2013-09-24 DIAGNOSIS — R7881 Bacteremia: Secondary | ICD-10-CM

## 2013-09-24 DIAGNOSIS — A329 Listeriosis, unspecified: Secondary | ICD-10-CM

## 2013-09-24 LAB — BLOOD GAS, ARTERIAL
Acid-Base Excess: 19 mmol/L — ABNORMAL HIGH (ref 0.0–2.0)
Bicarbonate: 45.7 mEq/L — ABNORMAL HIGH (ref 20.0–24.0)
Drawn by: 35135
O2 CONTENT: 4 L/min
O2 Saturation: 98.8 %
PATIENT TEMPERATURE: 98.6
PH ART: 7.349 — AB (ref 7.350–7.450)
TCO2: 48.3 mmol/L (ref 0–100)
pCO2 arterial: 85.1 mmHg (ref 35.0–45.0)
pO2, Arterial: 94.7 mmHg (ref 80.0–100.0)

## 2013-09-24 LAB — BASIC METABOLIC PANEL
BUN: 46 mg/dL — ABNORMAL HIGH (ref 6–23)
CHLORIDE: 88 meq/L — AB (ref 96–112)
CO2: 43 mEq/L (ref 19–32)
Calcium: 9 mg/dL (ref 8.4–10.5)
Creatinine, Ser: 2.02 mg/dL — ABNORMAL HIGH (ref 0.50–1.35)
GFR, EST AFRICAN AMERICAN: 38 mL/min — AB (ref >90)
GFR, EST NON AFRICAN AMERICAN: 33 mL/min — AB (ref >90)
Glucose, Bld: 109 mg/dL — ABNORMAL HIGH (ref 70–99)
POTASSIUM: 3.8 meq/L (ref 3.7–5.3)
SODIUM: 139 meq/L (ref 137–147)

## 2013-09-24 LAB — CBC
HCT: 35.7 % — ABNORMAL LOW (ref 39.0–52.0)
Hemoglobin: 10.9 g/dL — ABNORMAL LOW (ref 13.0–17.0)
MCH: 27.1 pg (ref 26.0–34.0)
MCHC: 30.5 g/dL (ref 30.0–36.0)
MCV: 88.8 fL (ref 78.0–100.0)
PLATELETS: 172 10*3/uL (ref 150–400)
RBC: 4.02 MIL/uL — ABNORMAL LOW (ref 4.22–5.81)
RDW: 16.7 % — AB (ref 11.5–15.5)
WBC: 5.8 10*3/uL (ref 4.0–10.5)

## 2013-09-24 LAB — PRO B NATRIURETIC PEPTIDE: PRO B NATRI PEPTIDE: 1792 pg/mL — AB (ref 0–125)

## 2013-09-24 MED ORDER — SODIUM CHLORIDE 0.9 % IV SOLN
INTRAVENOUS | Status: DC
  Administered 2013-09-25: 500 mL via INTRAVENOUS

## 2013-09-24 MED ORDER — POTASSIUM CHLORIDE CRYS ER 20 MEQ PO TBCR
20.0000 meq | EXTENDED_RELEASE_TABLET | Freq: Every day | ORAL | Status: DC
  Administered 2013-09-24 – 2013-09-28 (×5): 20 meq via ORAL
  Filled 2013-09-24 (×5): qty 1

## 2013-09-24 NOTE — Progress Notes (Signed)
RN received a critical arterial blood gas value C02 of 85. RN paged MD. MD directed RN to decrease pts oxygen between 0-2L with pt maintaining an oxygen saturation level between 90-92%.   Coolidge Breeze

## 2013-09-24 NOTE — Progress Notes (Signed)
63 y.o. male with past medical history of severe aortic stenosis s/p tissue AV replacement, parox atrial fibrillation, heart failure, hypertension, and coronary artery disease who presents with shortness of breath that has progressed over the last few months now with severe DOE. + orthopnea in addition to abdominal distention and lower leg swelling.  Also atrial fib with RVR.  He reports intermittent weeping of wounds of lower extremity due to le edema. on admit, he was found to be hypoxic on room air and put on 2 L nasal cannula. cxr showing pulmonary edema. He was admitted for heart failure exacerbation. He was afebrile on admit however has developed high fevers for the last 2 days of admit up to 102.3. Inaddition, he had 2 sets of blood cx showing gpr -LISTERIA.  No fever,chills,nightsweats at home.   Subjective: Feels better today, no CP, decreased SOB.  PCO2 85  Objective: Vital signs in last 24 hours: Temp:  [97.6 F (36.4 C)-99 F (37.2 C)] 97.6 F (36.4 C) (06/03 0415) Pulse Rate:  [77-109] 77 (06/03 0719) Resp:  [18-22] 20 (06/03 0415) BP: (104-129)/(60-69) 104/69 mmHg (06/03 0719) SpO2:  [89 %-97 %] 91 % (06/03 0833) Weight:  [218 lb 1.6 oz (98.93 kg)] 218 lb 1.6 oz (98.93 kg) (06/03 0525) Weight change: -10 lb 1.2 oz (-4.571 kg) Last BM Date: 09/22/13 Intake/Output from previous day: -1801  (-4400 since admit) wt 218 down from 240 on admit 06/02 0701 - 06/03 0700 In: 480 [P.O.:480] Out: 3325 [Urine:3325] Intake/Output this shift: Total I/O In: 240 [P.O.:240] Out: 600 [Urine:600]  PE: General:Pleasant affect, NAD Skin:Warm and dry, brisk capillary refill HEENT:normocephalic, sclera clear, mucus membranes moist Heart:irreg irreg without murmur, gallup, rub or click Lungs:clear without rales, rhonchi, or wheezes ZRA:QTMAU, soft, non tender, + BS, do not palpate liver spleen or masses QJF:HLKTGYBW lower ext edema bit still 1+ to hips, Neuro:alert and oriented, MAE,  follows commands, + facial symmetry     Lab Results:  Recent Labs  09/23/13 0349 09/24/13 0402  WBC 5.1 5.8  HGB 11.0* 10.9*  HCT 37.0* 35.7*  PLT 170 172   BMET  Recent Labs  09/22/13 0450 09/24/13 0402  NA 139 139  K 4.8 3.8  CL 96 88*  CO2 33* 43*  GLUCOSE 120* 109*  BUN 26* 46*  CREATININE 1.78* 2.02*  CALCIUM 8.4 9.0     Studies/Results: Dg Chest Port 1 View  09/22/2013   CLINICAL DATA:  Shortness of breath, fever, evaluate for pneumonia  EXAM: PORTABLE CHEST - 1 VIEW  COMPARISON:  09/19/2013; 11/11/2012; 10/11/2012  FINDINGS: Grossly unchanged enlarged cardiac silhouette and mediastinal contours with atherosclerotic plaque within the thoracic aorta. Post median sternotomy CABG. The pulmonary vasculature remains indistinct with cephalization of flow. Kerley B-lines are identified within the peripheral aspect of the left mid and lower lung. No new focal airspace opacities. No definite pleural effusion pneumothorax. Unchanged bones.  IMPRESSION: Grossly unchanged findings most suggestive of pulmonary edema, though note, atypical infection could have a similar appearance. Clinical correlation is advised. Further evaluation with a PA and lateral chest radiograph may be obtained as clinically indicated.   Electronically Signed   By: Sandi Mariscal M.D.   On: 09/22/2013 11:29    Medications: I have reviewed the patient's current medications. Scheduled Meds: . amiodarone  200 mg Oral BID  . ampicillin (OMNIPEN) IV  2 g Intravenous 4 times per day  . apixaban  5 mg Oral BID  . aspirin EC  81 mg Oral Daily  . atorvastatin  20 mg Oral q1800  . carvedilol  25 mg Oral BID WC  . folic acid  1 mg Oral Daily  . multivitamin with minerals  1 tablet Oral Daily  . potassium chloride SA  20 mEq Oral Daily  . sodium chloride  3 mL Intravenous Q12H  . thiamine  100 mg Oral Daily   Or  . thiamine  100 mg Intravenous Daily   Continuous Infusions:   PRN Meds:.sodium chloride,  acetaminophen, albuterol, LORazepam, LORazepam, sodium chloride  Assessment/Plan: 63 year old male with AS s/p AVR/CABG(SVG to PDA and PL) /MAZE(10/08/12), pAF on amiodarone, COPD who is admitted with volume overload, fever, now with Listeria blood CX.     Acute decompensated diastolic heart failure,improved with high dose lasix and metolazone. Currently on hold today secondary to increase in creat and BUN. 1.78>2.02. Prior creat in 11/14 was 1.2. On admit creat was 1.4.   Heart failure team on board. Appreciate their assistance as well. Reviewed note.     Atrial fibrillation with RVR--HR 105 occ PVC-last DCCV in Nov.2014 (did not hold), Still on AMIO. If doing TEE tomorrow, will attempt cardioversion as well to give him one last shot at rhythm control since he has been on AMIO long term. If he fails again (high likelihood with other comorbidities) continue with rate control. Agree with stopping digoxin with worsening renal function.     Listeria +blood cultures -  ID and IM, ABX AMP. Will order TEE for tomorrow.     COPD (chronic obstructive pulmonary disease) - hypercarbia chronic. Quite significant. Mentating well.     Anticoagulation on Eliquis (Restart) - had break in anticoagulation as outpatient. If cardioversion needs TEE.     Cellulitis, chronic lower ext-dsg in place    Debilitation, has never increased his energy to walk without walker, PT    LOS: 5 days     Candee Furbish, MD

## 2013-09-24 NOTE — Progress Notes (Signed)
CRITICAL VALUE ALERT  Critical value received:  C02  Date of notification:  09/24/13  Time of notification:  0349  Critical value read back:yes  Nurse who received alert:  Mar Daring   MD notified (1st page):  Tylene Fantasia  Time of first page:  0609  Responding MD:  Tylene Fantasia  Time MD responded:  [phone]

## 2013-09-24 NOTE — Progress Notes (Signed)
CRITICAL VALUE ALERT  Critical value received:  C02  Date of notification:  09/24/13  Time of notification:  0704  Critical value read back:yes  Nurse who received alert:  Mar Daring   MD notified (1st page):  Eliseo Squires  Time of first page:  0706  MD notified (2nd page):  Time of second page:  Responding MD:  Eliseo Squires  Time MD responded:  [phone]

## 2013-09-24 NOTE — Progress Notes (Signed)
PT Cancellation Note  Patient Details Name: NITESH PITSTICK MRN: [id number] DOB: [birth year]   Cancelled Treatment:    Reason Eval/Treat Not Completed: Medical issues which prohibited therapy. Noted orders for LE doppler to r/o DVT. Pt also with poor saturation on 6L.   Jeanie Cooks Quentavious Rittenhouse 09/24/2013, 8:54 AM Pager [phone]

## 2013-09-24 NOTE — Progress Notes (Signed)
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 His CHADS2 score is 2.   Acute decompensated heart failure  Lasix being held for now  On aspirin and digoxin as well. Has negative balance.   COPD (chronic obstructive pulmonary disease)  No signs of exacerbation. Continue O2 via nasal cannula and albuterol nebs   CAD  On aspirin, statin and carvedilol   Deconditioning  PT eval   Chronic Left leg cellulitis and peripheral vascular disease  No signs of acute infection and as per pt the leg appears much better now. Dressing as per wound care consult  Duplex negative for DVT  Acute kidney injury  Has good urine output. Continue to monitor   Code Status: full Family Communication: patient/daughter- LM Disposition Plan:    HPI/Subjective: No fevers Not SOB  Objective: Filed Vitals:   09/24/13 0719  BP: 104/69  Pulse: 77  Temp:   Resp:     Intake/Output Summary (Last 24 hours) at 09/24/13 1046 Last data filed at 09/24/13 0800  Gross per 24 hour  Intake    720 ml  Output   3925 ml  Net  -3205 ml   Filed Weights   09/22/13 0352 09/23/13 0455 09/24/13 0525  Weight: 106.187 kg (234 lb 1.6 oz) 103.5 kg (228 lb 2.8 oz) 98.93 kg (218 lb 1.6 oz)    Exam:   General:  Pleasant/cooperative- not oriented to time  Cardiovascular: irr  Respiratory: decrease breath sounds  Abdomen: +Bs, soft  Musculoskeletal: moves all 4 ext   Data Reviewed: Basic Metabolic Panel:  Recent Labs Lab 09/19/13 2009 09/20/13 0054 09/21/13 0405 09/22/13 0450 09/24/13 0402  NA 136* 139 139 139 139  K 4.4 4.2 4.5 4.8 3.8  CL 96 98 95* 96 88*  CO2 29 32 36* 33* 43*  GLUCOSE 116*  108* 109* 120* 109*  BUN 18 18 24* 26* 46*  CREATININE 1.41* 1.41* 1.61* 1.78* 2.02*  CALCIUM 8.6 8.7 8.7 8.4 9.0   Liver Function Tests:  Recent Labs Lab 09/19/13 2009  AST 50*  ALT 46  ALKPHOS 65  BILITOT 0.4  PROT 7.6  ALBUMIN 2.7*   No results found for this basename: LIPASE, AMYLASE,  in the last 168 hours No results found for this basename: AMMONIA,  in the last 168 hours CBC:  Recent Labs Lab 09/20/13 1550 09/21/13 0405 09/22/13 0450 09/23/13 0349 09/24/13 0402  WBC 4.6 5.4 5.0 5.1 5.8  HGB 10.8* 10.8* 11.0* 11.0* 10.9*  HCT 37.0* 36.7* 36.6* 37.0* 35.7*  MCV 89.8 89.3 89.5 89.4 88.8  PLT 185 182 177 170 172   Cardiac Enzymes: No results found for this basename: CKTOTAL, CKMB, CKMBINDEX, TROPONINI,  in the last 168 hours BNP (last 3 results)  Recent Labs  10/01/12 0415 09/19/13 1941 09/24/13 0400  PROBNP 11193.0* 3530.0* 1792.0*   CBG: No results found for this basename: GLUCAP,  in the last 168 hours  Recent Results (from the past 240 hour(s))  CULTURE, BLOOD (ROUTINE X 2)     Status: None   Collection Time    09/21/13 10:11 AM      Result Value Ref Range Status   Specimen Description BLOOD RIGHT ARM  Final   Special Requests BOTTLES DRAWN AEROBIC AND ANAEROBIC 5CC   Final   Culture  Setup Time     Final   Value: 09/21/2013 18:11     Performed at [facility]   Culture     Final   Value: LISTERIA MONOCYTOGENES     Note: Recommended therapy: ampicillin with or without an aminoglycoside. Cephalosporins are not effective. CRITICAL RESULT CALLED TO, READ BACK BY AND VERIFIED WITH: OLIVIA STEIN 09/24/13 0800 BY SMITHERSJ     Note: Gram Stain Report Called to,Read Back By and Verified With: HEATHER B BY INGRAM A 09/22/13 1205PM     Performed at [facility]   Report Status PENDING   Incomplete  CULTURE, BLOOD (ROUTINE X 2)     Status: None   Collection Time    09/21/13 10:16 AM      Result Value Ref Range Status   Specimen Description  BLOOD RIGHT ARM   Final   Special Requests BOTTLES DRAWN AEROBIC AND ANAEROBIC 5CC   Final   Culture  Setup Time     Final   Value: 09/21/2013 18:11     Performed at [facility]   Culture     Final   Value: LISTERIA MONOCYTOGENES     Note: Recommended therapy: ampicillin with or without an aminoglycoside. Cephalosporins are not effective. CRITICAL RESULT CALLED TO, READ BACK BY AND VERIFIED WITH: OLIVIA STEIN 09/24/13 0800 BY SMITHERSJ     Note: Gram Stain Report Called to,Read Back By and Verified With: HEATHER B BY INGRAM A 09/22/13 1205PM     Performed at [facility]   Report Status PENDING   Incomplete  URINE CULTURE     Status: None   Collection Time    09/21/13 10:24 AM      Result Value Ref Range Status   Specimen Description URINE, RANDOM   Final   Special Requests Normal   Final   Culture  Setup Time     Final   Value: 09/21/2013 18:41     Performed at [facility]     Final   Value: 25,000 COLONIES/ML     Performed at [facility]   Culture     Final   Value: Multiple bacterial morphotypes present, none predominant. Suggest appropriate recollection if clinically indicated.     Performed at [facility]   Report Status 09/22/2013 FINAL   Final  CULTURE, BLOOD (ROUTINE X 2)     Status: None   Collection Time    09/22/13  1:56 PM      Result Value Ref Range Status   Specimen Description BLOOD LEFT HAND   Final   Special Requests     Final   Value: BOTTLES DRAWN AEROBIC AND ANAEROBIC 10CC BLUE  8CC RED   Culture  Setup Time     Final   Value: 09/22/2013 18:23     Performed at [facility]   Culture     Final   Value: GRAM POSITIVE RODS     Note: Gram Stain Report Called to,Read Back By and Verified With: HEATHER B@[id number]  ON [id number] BY [facility]     Performed at [facility]   Report Status PENDING   Incomplete  CULTURE, BLOOD (ROUTINE X 2)     Status: None   Collection Time    09/22/13  2:02 PM       Result Value Ref Range  Status   Specimen Description BLOOD RIGHT HAND   Final   Special Requests BOTTLES DRAWN AEROBIC ONLY 10CC   Final   Culture  Setup Time     Final   Value: 09/22/2013 18:23     Performed at [facility]   Culture     Final   Value: GRAM POSITIVE RODS     Note: Gram Stain Report Called to,Read Back By and Verified With: BREANA DAVIS ON 09/23/2013 AT 7:21P BY Dennard Nip     Performed at [facility]   Report Status PENDING   Incomplete     Studies: Dg Chest Port 1 View  09/22/2013   CLINICAL DATA:  Shortness of breath, fever, evaluate for pneumonia  EXAM: PORTABLE CHEST - 1 VIEW  COMPARISON:  09/19/2013; 11/11/2012; 10/11/2012  FINDINGS: Grossly unchanged enlarged cardiac silhouette and mediastinal contours with atherosclerotic plaque within the thoracic aorta. Post median sternotomy CABG. The pulmonary vasculature remains indistinct with cephalization of flow. Kerley B-lines are identified within the peripheral aspect of the left mid and lower lung. No new focal airspace opacities. No definite pleural effusion pneumothorax. Unchanged bones.  IMPRESSION: Grossly unchanged findings most suggestive of pulmonary edema, though note, atypical infection could have a similar appearance. Clinical correlation is advised. Further evaluation with a PA and lateral chest radiograph may be obtained as clinically indicated.   Electronically Signed   By: Sandi Mariscal M.D.   On: 09/22/2013 11:29    Scheduled Meds: . amiodarone  200 mg Oral BID  . ampicillin (OMNIPEN) IV  2 g Intravenous 4 times per day  . apixaban  5 mg Oral BID  . atorvastatin  20 mg Oral q1800  . carvedilol  25 mg Oral BID WC  . folic acid  1 mg Oral Daily  . multivitamin with minerals  1 tablet Oral Daily  . potassium chloride SA  20 mEq Oral Daily  . sodium chloride  3 mL Intravenous Q12H  . thiamine  100 mg Oral Daily   Or  . thiamine  100 mg Intravenous Daily   Continuous Infusions:   Antibiotics  Given (last 72 hours)   Date/Time Action Medication Dose Rate   09/22/13 1456 Given   ceFEPIme (MAXIPIME) 2 g in dextrose 5 % 50 mL IVPB 2 g 100 mL/hr   09/22/13 1605 Given   vancomycin (VANCOCIN) 2,000 mg in sodium chloride 0.9 % 500 mL IVPB 2,000 mg 250 mL/hr   09/23/13 1257 Given   ceFEPIme (MAXIPIME) 2 g in dextrose 5 % 50 mL IVPB 2 g 100 mL/hr   09/23/13 1653 Given   ampicillin (OMNIPEN) 2 g in sodium chloride 0.9 % 50 mL IVPB 2 g 150 mL/hr   09/23/13 2309 Given   ampicillin (OMNIPEN) 2 g in sodium chloride 0.9 % 50 mL IVPB 2 g 150 mL/hr   09/24/13 0277 Given   ampicillin (OMNIPEN) 2 g in sodium chloride 0.9 % 50 mL IVPB 2 g 150 mL/hr      Principal Problem:   Acute decompensated heart failure Active Problems:   Atrial fibrillation with RVR   Fever of unknown origin (FUO)   COPD (chronic obstructive pulmonary disease)    Time spent: 35 min    [facility] Pager [phone]. If 7PM-7AM, please contact night-coverage at [email or web address], password [facility] 09/24/2013, 10:46 AM  LOS: 5 days

## 2013-09-24 NOTE — Progress Notes (Signed)
Physical Therapy Treatment Patient Details Name: [name] MRN: [id number] DOB: 1950/12/26 Today's Date: 09/24/2013    History of Present Illness Pt admit with CHF and afib and now with fever of unknown origin.  PMH- CABG, COPD    PT Comments    Pt progressing towards physical therapy goals. Was able to perform some therapeutic exercise as well as ambulation with supplemental O2 increased to 3L/min to maintain sats 88-90% with pursed-lip breathing. Will continue to follow and progress per POC.   Follow Up Recommendations  Home health PT;Supervision - Intermittent     Equipment Recommendations  None recommended by PT    Recommendations for Other Services       Precautions / Restrictions Precautions Precautions: Fall Restrictions Weight Bearing Restrictions: No    Mobility  Bed Mobility Overal bed mobility: Modified Independent             General bed mobility comments: HOB elevated.  Transfers Overall transfer level: Modified independent Equipment used: Rolling walker (2 wheeled)             General transfer comment: Pt able to transition to full standing position without assistance.   Ambulation/Gait Ambulation/Gait assistance: Supervision Ambulation Distance (Feet): 125 Feet Assistive device: Rolling walker (2 wheeled) Gait Pattern/deviations: Step-through pattern;Decreased stride length;Trunk flexed Gait velocity: Decreased Gait velocity interpretation: Below normal speed for age/gender General Gait Details: Pt able to ambulate ~50 feet before O2 sats decreased from 89% to 84%. Supplemental O2 was increased to 3L/min and pt was able to improve O2 sats with pursed-lip breathing to 88-90%.    Stairs            Wheelchair Mobility    Modified Rankin (Stroke Patients Only)       Balance Overall balance assessment: Needs assistance Sitting-balance support: Feet supported;No upper extremity supported Sitting balance-Leahy Scale: Fair     Standing balance support: Bilateral upper extremity supported;During functional activity Standing balance-Leahy Scale: Fair                      Cognition Arousal/Alertness: Awake/alert Behavior During Therapy: WFL for tasks assessed/performed Overall Cognitive Status: Within Functional Limits for tasks assessed                      Exercises General Exercises - Lower Extremity Long Arc Quad: 15 reps Hip ABduction/ADduction: 15 reps Straight Leg Raises: 15 reps    General Comments General comments (skin integrity, edema, etc.): Pt states back pain limits his mobility      Pertinent Vitals/Pain See above for O2 sats    Home Living                      Prior Function            PT Goals (current goals can now be found in the care plan section) Acute Rehab PT Goals Patient Stated Goal: to go home PT Goal Formulation: With patient Time For Goal Achievement: 09/27/13 Potential to Achieve Goals: Good Progress towards PT goals: Progressing toward goals    Frequency  Min 3X/week    PT Plan Current plan remains appropriate    Co-evaluation             End of Session Equipment Utilized During Treatment: Gait belt;Oxygen Activity Tolerance: Patient limited by fatigue Patient left: in chair;with call bell/phone within reach;with family/visitor present     Time: 9379-0240 PT Time Calculation (min): 32 min  Charges:  $Gait Training: 8-22 mins $Therapeutic Exercise: 8-22 mins                    G Codes:      Jolyn Lent 10-17-2013, 4:51 PM  Jolyn Lent, PT, DPT Acute Rehabilitation Services Pager: [phone]

## 2013-09-24 NOTE — Progress Notes (Signed)
VASCULAR LAB PRELIMINARY  PRELIMINARY  PRELIMINARY  PRELIMINARY  Right lower extremity venous duplex completed.    Preliminary report:  Right:  No evidence of DVT, superficial thrombosis, or Baker's cyst.  [place], RVS 09/24/2013, 10:31 AM

## 2013-09-24 NOTE — Progress Notes (Signed)
Advanced Heart Failure Rounding Note   Subjective:    [name] is 63 y.o. male with past medical history of severe aortic stenosis s/p tissue AV replacement, atrial fibrillation, heart failure, hypertension, and coronary artery disease who presents with shortness of breath that has progressed over the last few months now with severe DOE. + orthopnea in addition to abdominal distention and lower leg swelling. Also atrial fib with RVR. He reports intermittent weeping of wounds of lower extremity due to le edema. on admit, he was found to be hypoxic on room air and put on 2 L nasal cannula. cxr showing pulmonary edema. Does use oxygen at home. He was admitted for heart failure exacerbation. He was afebrile on admit however has developed high fevers for the last 2 days of admit up to 102.3 yesterday.   Diuresing with IV lasix 80 mg tid and 5 mg metolazone daily. Weight down another 10 pounds. Overall weight down 22 pounds.   [place] feeling better . Mild dyspnea with exertion .   Blood Cultures --> Listeria ID following Creatinine 1.78>2.02  ECHO 30-35% 08/2012  ECHO 08/3013 EF 50-55% Grade II DD  SH: Former Biochemist, clinical. Quit smoking 1 year ago. Drink alcohol occasionally . Denies drug use. Lives alone  FH: Father died MI at 62  Brother MI   Objective:   Weight Range:  Vital Signs:   Temp:  [97.6 F (36.4 C)-99 F (37.2 C)] 97.6 F (36.4 C) (06/03 0415) Pulse Rate:  [77-109] 77 (06/03 0719) Resp:  [18-22] 20 (06/03 0415) BP: (104-129)/(60-69) 104/69 mmHg (06/03 0719) SpO2:  [89 %-97 %] 91 % (06/03 0833) Weight:  [218 lb 1.6 oz (98.93 kg)] 218 lb 1.6 oz (98.93 kg) (06/03 0525) Last BM Date: 09/22/13  Weight change: Filed Weights   09/22/13 0352 09/23/13 0455 09/24/13 0525  Weight: 234 lb 1.6 oz (106.187 kg) 228 lb 2.8 oz (103.5 kg) 218 lb 1.6 oz (98.93 kg)    Intake/Output:   Intake/Output Summary (Last 24 hours) at 09/24/13 0841 Last data filed at 09/24/13 0800  Gross per 24  hour  Intake    480 ml  Output   3525 ml  Net  -3045 ml     Physical Exam: General:  Chronically ill appearing. No resp difficulty. Sitting in chair  HEENT: normal Neck: supple. JVP 5-6. Carotids 2+ bilat; no bruits. No lymphadenopathy or thryomegaly appreciated. Cor: PMI nondisplaced. Regular rate & rhythm. No rubs, gallops or murmurs. Lungs: Decrease breath sounds. On 2 liters [place]  Abdomen: soft, nontender, nondistended. No hepatosplenomegaly. No bruits or masses. Good bowel sounds. Extremities: no cyanosis, clubbing, rash, R and LLE trace-1+ edema Neuro: alert & orientedx3, cranial nerves grossly intact. moves all 4 extremities w/o difficulty. Affect pleasant  Telemetry: A fib 75-105   Labs: Basic Metabolic Panel:  Recent Labs Lab 09/19/13 2009 09/20/13 0054 09/21/13 0405 09/22/13 0450 09/24/13 0402  NA 136* 139 139 139 139  K 4.4 4.2 4.5 4.8 3.8  CL 96 98 95* 96 88*  CO2 29 32 36* 33* 43*  GLUCOSE 116* 108* 109* 120* 109*  BUN 18 18 24* 26* 46*  CREATININE 1.41* 1.41* 1.61* 1.78* 2.02*  CALCIUM 8.6 8.7 8.7 8.4 9.0    Liver Function Tests:  Recent Labs Lab 09/19/13 2009  AST 50*  ALT 46  ALKPHOS 65  BILITOT 0.4  PROT 7.6  ALBUMIN 2.7*   No results found for this basename: LIPASE, AMYLASE,  in the last 168 hours No  results found for this basename: AMMONIA,  in the last 168 hours  CBC:  Recent Labs Lab 09/20/13 1550 09/21/13 0405 09/22/13 0450 09/23/13 0349 09/24/13 0402  WBC 4.6 5.4 5.0 5.1 5.8  HGB 10.8* 10.8* 11.0* 11.0* 10.9*  HCT 37.0* 36.7* 36.6* 37.0* 35.7*  MCV 89.8 89.3 89.5 89.4 88.8  PLT 185 182 177 170 172    Cardiac Enzymes: No results found for this basename: CKTOTAL, CKMB, CKMBINDEX, TROPONINI,  in the last 168 hours  BNP: BNP (last 3 results)  Recent Labs  10/01/12 0415 09/19/13 1941 09/24/13 0400  PROBNP 11193.0* 3530.0* 1792.0*     Other results:    Imaging: Dg Chest Port 1 View  09/22/2013   CLINICAL DATA:   Shortness of breath, fever, evaluate for pneumonia  EXAM: PORTABLE CHEST - 1 VIEW  COMPARISON:  09/19/2013; 11/11/2012; 10/11/2012  FINDINGS: Grossly unchanged enlarged cardiac silhouette and mediastinal contours with atherosclerotic plaque within the thoracic aorta. Post median sternotomy CABG. The pulmonary vasculature remains indistinct with cephalization of flow. Kerley B-lines are identified within the peripheral aspect of the left mid and lower lung. No new focal airspace opacities. No definite pleural effusion pneumothorax. Unchanged bones.  IMPRESSION: Grossly unchanged findings most suggestive of pulmonary edema, though note, atypical infection could have a similar appearance. Clinical correlation is advised. Further evaluation with a PA and lateral chest radiograph may be obtained as clinically indicated.   Electronically Signed   By: Sandi Mariscal M.D.   On: 09/22/2013 11:29      Medications:     Scheduled Medications: . amiodarone  200 mg Oral BID  . ampicillin (OMNIPEN) IV  2 g Intravenous 4 times per day  . apixaban  5 mg Oral BID  . aspirin EC  81 mg Oral Daily  . atorvastatin  20 mg Oral q1800  . carvedilol  25 mg Oral BID WC  . digoxin  0.125 mg Oral Daily  . folic acid  1 mg Oral Daily  . furosemide  80 mg Intravenous TID  . metolazone  5 mg Oral Daily  . multivitamin with minerals  1 tablet Oral Daily  . potassium chloride SA  20 mEq Oral BID  . sodium chloride  3 mL Intravenous Q12H  . thiamine  100 mg Oral Daily   Or  . thiamine  100 mg Intravenous Daily     Infusions:     PRN Medications:  sodium chloride, acetaminophen, albuterol, LORazepam, LORazepam, sodium chloride   Assessment:  1. Acute/ Chronic Diastolic Heart Failure ECHO EF 50-55% Grade II DD 2. Chronic Atrial fibrillation on eliquis- had DC-CV in November 2014 but later went back in A fib.  3. Fever---> Blood cultures listeria. On Ampicillin per ID 4. COPD 5. Cellulitis  6. CAD ---> CABG AVR  09/2012 by Dr Servando Snare 7. PAD h/o aorto bifem iliac stent  8. Deconditioning 9. AKI- creatinine elevated 1.7>2.0  10: Hypoxia--> O2 sats 83% on room air.       Plan/Discussion:    Volume status appears stable. Overall weight down 22 pounds. Creatinine bump noted. Stop IV lasix. Follow renal function closely.   Chronic A fib. Rate controlled. On eliquis 5 mg twice a day.   Blood cultures + listeria. Per ID will need TEE. Hopefully TEE tomorrow.   The Heart Failure Team will follow from a distance. We will be happy to see him in the HF clinic once discharged for volume management.    Length of Stay: 5  Amy D Clegg NP-C  09/24/2013, 8:41 AM  Advanced Heart Failure Team Pager [phone] (M-F; 7a - 4p)  Please contact [facility] for night-coverage after hours (4p -7a ) and weekends on [email or web address]  Patient seen and examined with Darrick Grinder, NP. We discussed all aspects of the encounter. I agree with the assessment and plan as stated above.   Difficult situation. From a HF standpoint volume status is much improved. Renal function is worse. Would continue to hold diuretics for at least another day or two then restart. We will be happy to help manage him as outpt in HF Clinic.We will stop digoxin, too.   In talking with him he is very debilitated and has severe COPD on exam. He also apparently has ETOH abuse. Thus I am very concerned about his ability to continue to live independently.   He is pending TEE for listeria bacteremia and as he has been on amiodarone for months I do think it may be reasonable to attempt DC-CV at same time if he is chronic anticoagulation candidate. D/W Dr. Gerline Legacy Yandell Mcjunkins,MD 5:37 PM

## 2013-09-24 NOTE — Progress Notes (Signed)
[facility] for Infectious Disease    Date of Admission:  09/19/2013   Total days of antibiotics 3        Day 2 ampicillin           ID: [name] is a 63 y.o. male with  Decompensated heart failure, fevers found to have listeria bacteremia Principal Problem:   Acute decompensated heart failure Active Problems:   Atrial fibrillation with RVR   Fever of unknown origin (FUO)   COPD (chronic obstructive pulmonary disease)    Subjective: Afebrile, waiting for his mri  Spoke with micro lab: all 4 sets of blood cx c/w listeria  Medications:  . amiodarone  200 mg Oral BID  . ampicillin (OMNIPEN) IV  2 g Intravenous 4 times per day  . apixaban  5 mg Oral BID  . aspirin EC  81 mg Oral Daily  . atorvastatin  20 mg Oral q1800  . carvedilol  25 mg Oral BID WC  . folic acid  1 mg Oral Daily  . multivitamin with minerals  1 tablet Oral Daily  . potassium chloride SA  20 mEq Oral Daily  . sodium chloride  3 mL Intravenous Q12H  . thiamine  100 mg Oral Daily   Or  . thiamine  100 mg Intravenous Daily    Objective: Vital signs in last 24 hours: Temp:  [97.6 F (36.4 C)-99 F (37.2 C)] 97.6 F (36.4 C) (06/03 0415) Pulse Rate:  [77-109] 77 (06/03 0719) Resp:  [18-22] 20 (06/03 0415) BP: (104-129)/(60-69) 104/69 mmHg (06/03 0719) SpO2:  [89 %-97 %] 91 % (06/03 0833) Weight:  [218 lb 1.6 oz (98.93 kg)] 218 lb 1.6 oz (98.93 kg) (06/03 0525)  General: Elderly obese male sitting up in chair in no acute distress.  HEENT: No pallor, no icterus, no oral thrush, no cervical lymphadenopathy  Heart: S1 and S2 irregularly [id number],7/1 systolic murmur  Lungs: Diminished breath sounds in bilateral lung bases, no crackles, rhonchi or wheeze  Abdomen: Soft, nontender, nondistended, positive bowel sounds.  Extremities: +2 edema bilaterally LE  Skin: Cellulitis to distal pretibial area of left leg, and posterior right leg, weeping over left leg, no obvious bruising and discharged.  No back tenderness. No ankle or knee tenderness or swelling, right calf tenderness to pressure.  Neuro: AAOX3, CN 2-12 intact    Lab Results  Recent Labs  09/22/13 0450 09/23/13 0349 09/24/13 0402  WBC 5.0 5.1 5.8  HGB 11.0* 11.0* 10.9*  HCT 36.6* 37.0* 35.7*  NA 139  --  139  K 4.8  --  3.8  CL 96  --  88*  CO2 33*  --  43*  BUN 26*  --  46*  CREATININE 1.78*  --  2.02*   Sedimentation Rate  Recent Labs  09/22/13 0450  ESRSEDRATE 110*   C-Reactive Protein  Recent Labs  09/22/13 1356  CRP 4.5*    Microbiology: 5/31 blood cx 2/2 listeria 6/1 blood cx 1/ 2 listeria  Studies/Results: Dg Chest Port 1 View  09/22/2013   CLINICAL DATA:  Shortness of breath, fever, evaluate for pneumonia  EXAM: PORTABLE CHEST - 1 VIEW  COMPARISON:  09/19/2013; 11/11/2012; 10/11/2012  FINDINGS: Grossly unchanged enlarged cardiac silhouette and mediastinal contours with atherosclerotic plaque within the thoracic aorta. Post median sternotomy CABG. The pulmonary vasculature remains indistinct with cephalization of flow. Kerley B-lines are identified within the peripheral aspect of the left mid and lower lung. No new focal airspace  opacities. No definite pleural effusion pneumothorax. Unchanged bones.  IMPRESSION: Grossly unchanged findings most suggestive of pulmonary edema, though note, atypical infection could have a similar appearance. Clinical correlation is advised. Further evaluation with a PA and lateral chest radiograph may be obtained as clinically indicated.   Electronically Signed   By: Sandi Mariscal M.D.   On: 09/22/2013 11:29   Assessment/Plan: Listeria bacteremia = continue on ampicillin 2gm IV Q4hr. Will repeat blood cultures tomorrow, may need to add gentamicin if +TEE but would like to do AG sparing regimen due to CKD.  Listeria can cause disseminated disease. Agree with primary team to do TEE as well as mri of spine.   Discussed with Dr. Eliseo Squires plan for imaging, TEE with Dr.  Marlou Porch on Thursday after diuresis. Likely treat for extended period of time if MRI or TEE +  [facility] Cell: [phone] Pager: [phone]  09/24/2013, 9:26 AM

## 2013-09-25 ENCOUNTER — Encounter (HOSPITAL_COMMUNITY): Payer: Self-pay | Admitting: Gastroenterology

## 2013-09-25 ENCOUNTER — Inpatient Hospital Stay (HOSPITAL_COMMUNITY): Payer: Medicaid Other | Admitting: Anesthesiology

## 2013-09-25 ENCOUNTER — Encounter (HOSPITAL_COMMUNITY)
Admission: EM | Disposition: A | Discharge status: Home Health | Payer: Medicaid Other | Source: Home / Self Care | Attending: Cardiovascular Disease

## 2013-09-25 ENCOUNTER — Encounter (HOSPITAL_COMMUNITY): Payer: Medicaid Other | Admitting: Anesthesiology

## 2013-09-25 DIAGNOSIS — I4892 Unspecified atrial flutter: Secondary | ICD-10-CM

## 2013-09-25 DIAGNOSIS — M549 Dorsalgia, unspecified: Secondary | ICD-10-CM

## 2013-09-25 HISTORY — PX: TEE WITHOUT CARDIOVERSION: SHX5443

## 2013-09-25 HISTORY — PX: CARDIOVERSION: SHX1299

## 2013-09-25 LAB — COMPREHENSIVE METABOLIC PANEL
ALK PHOS: 49 U/L (ref 39–117)
ALT: 87 U/L — ABNORMAL HIGH (ref 0–53)
AST: 101 U/L — ABNORMAL HIGH (ref 0–37)
Albumin: 2.6 g/dL — ABNORMAL LOW (ref 3.5–5.2)
BUN: 41 mg/dL — AB (ref 6–23)
CO2: 43 mEq/L (ref 19–32)
Calcium: 8.5 mg/dL (ref 8.4–10.5)
Chloride: 92 mEq/L — ABNORMAL LOW (ref 96–112)
Creatinine, Ser: 1.5 mg/dL — ABNORMAL HIGH (ref 0.50–1.35)
GFR calc non Af Amer: 47 mL/min — ABNORMAL LOW (ref >90)
GFR, EST AFRICAN AMERICAN: 55 mL/min — AB (ref >90)
GLUCOSE: 125 mg/dL — AB (ref 70–99)
POTASSIUM: 3.9 meq/L (ref 3.7–5.3)
Sodium: 139 mEq/L (ref 137–147)
TOTAL PROTEIN: 7.2 g/dL (ref 6.0–8.3)
Total Bilirubin: 0.4 mg/dL (ref 0.3–1.2)

## 2013-09-25 LAB — BASIC METABOLIC PANEL
BUN: 44 mg/dL — ABNORMAL HIGH (ref 6–23)
CALCIUM: 8.8 mg/dL (ref 8.4–10.5)
CO2: 42 mEq/L (ref 19–32)
CREATININE: 1.5 mg/dL — AB (ref 0.50–1.35)
Chloride: 90 mEq/L — ABNORMAL LOW (ref 96–112)
GFR calc Af Amer: 55 mL/min — ABNORMAL LOW (ref >90)
GFR, EST NON AFRICAN AMERICAN: 47 mL/min — AB (ref >90)
GLUCOSE: 116 mg/dL — AB (ref 70–99)
Potassium: 3.7 mEq/L (ref 3.7–5.3)
Sodium: 138 mEq/L (ref 137–147)

## 2013-09-25 LAB — CULTURE, BLOOD (ROUTINE X 2)

## 2013-09-25 LAB — CBC
HCT: 36.6 % — ABNORMAL LOW (ref 39.0–52.0)
Hemoglobin: 11.2 g/dL — ABNORMAL LOW (ref 13.0–17.0)
MCH: 27.4 pg (ref 26.0–34.0)
MCHC: 30.6 g/dL (ref 30.0–36.0)
MCV: 89.5 fL (ref 78.0–100.0)
PLATELETS: 172 10*3/uL (ref 150–400)
RBC: 4.09 MIL/uL — AB (ref 4.22–5.81)
RDW: 16.8 % — ABNORMAL HIGH (ref 11.5–15.5)
WBC: 5 10*3/uL (ref 4.0–10.5)

## 2013-09-25 LAB — AMMONIA: AMMONIA: 25 umol/L (ref 11–60)

## 2013-09-25 LAB — BLOOD GAS, ARTERIAL
ACID-BASE EXCESS: 17.3 mmol/L — AB (ref 0.0–2.0)
Bicarbonate: 42.9 mEq/L — ABNORMAL HIGH (ref 20.0–24.0)
Drawn by: 308601
O2 Content: 2 L/min
O2 SAT: 92 %
PATIENT TEMPERATURE: 98.6
PCO2 ART: 66.9 mmHg — AB (ref 35.0–45.0)
TCO2: 44.9 mmol/L (ref 0–100)
pH, Arterial: 7.423 (ref 7.350–7.450)
pO2, Arterial: 59.7 mmHg — ABNORMAL LOW (ref 80.0–100.0)

## 2013-09-25 SURGERY — ECHOCARDIOGRAM, TRANSESOPHAGEAL
Anesthesia: Monitor Anesthesia Care

## 2013-09-25 MED ORDER — BUTAMBEN-TETRACAINE-BENZOCAINE 2-2-14 % EX AERO
INHALATION_SPRAY | CUTANEOUS | Status: DC | PRN
  Administered 2013-09-25: 2 via TOPICAL

## 2013-09-25 MED ORDER — HALOPERIDOL LACTATE 5 MG/ML IJ SOLN
1.0000 mg | Freq: Four times a day (QID) | INTRAMUSCULAR | Status: DC | PRN
Start: 2013-09-25 — End: 2013-09-30
  Filled 2013-09-25: qty 0.2

## 2013-09-25 MED ORDER — SODIUM CHLORIDE 0.9 % IV SOLN
INTRAVENOUS | Status: DC | PRN
  Administered 2013-09-25: 11:00:00 via INTRAVENOUS

## 2013-09-25 MED ORDER — MIDAZOLAM HCL 5 MG/5ML IJ SOLN
INTRAMUSCULAR | Status: DC | PRN
  Administered 2013-09-25: 2 mg via INTRAVENOUS

## 2013-09-25 MED ORDER — FENTANYL CITRATE 0.05 MG/ML IJ SOLN
INTRAMUSCULAR | Status: DC | PRN
  Administered 2013-09-25 (×2): 50 ug via INTRAVENOUS

## 2013-09-25 MED ORDER — ALBUTEROL SULFATE (2.5 MG/3ML) 0.083% IN NEBU: 3.0000 mL | INHALATION_SOLUTION | RESPIRATORY_TRACT | Status: DC | PRN

## 2013-09-25 MED ORDER — IPRATROPIUM BROMIDE 0.02 % IN SOLN: 0.5000 mg | Freq: Four times a day (QID) | RESPIRATORY_TRACT | Status: DC

## 2013-09-25 MED ORDER — PROPOFOL INFUSION 10 MG/ML OPTIME
INTRAVENOUS | Status: DC | PRN
  Administered 2013-09-25: 50 ug/kg/min via INTRAVENOUS

## 2013-09-25 MED ORDER — IPRATROPIUM-ALBUTEROL 0.5-2.5 (3) MG/3ML IN SOLN
3.0000 mL | RESPIRATORY_TRACT | Status: DC | PRN
  Administered 2013-09-25: 3 mL via RESPIRATORY_TRACT
  Filled 2013-09-25: qty 3

## 2013-09-25 MED ORDER — HALOPERIDOL 1 MG PO TABS
1.0000 mg | ORAL_TABLET | Freq: Four times a day (QID) | ORAL | Status: DC | PRN
  Filled 2013-09-25 (×2): qty 1

## 2013-09-25 NOTE — Transfer of Care (Signed)
Immediate Anesthesia Transfer of Care Note  Patient: [name]  Procedure(s) Performed: Procedure(s): TRANSESOPHAGEAL ECHOCARDIOGRAM (TEE) (N/A) CARDIOVERSION (N/A)  Patient Location: Endoscopy Unit  Anesthesia Type:General  Level of Consciousness: awake, alert  and oriented  Airway & Oxygen Therapy: Patient Spontanous Breathing and Patient connected to nasal cannula oxygen  Post-op Assessment: Report given to PACU RN, Post -op Vital signs reviewed and stable and Patient moving all extremities X 4  Post vital signs: Reviewed and stable  Complications: No apparent anesthesia complications

## 2013-09-25 NOTE — Care Management Note (Signed)
    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  PTA, pt lives alone.     Action/Plan:   Met with pt and daughter to discuss home arrangements, Eliquis.   Anticipated DC Date:  09/29/2013   Anticipated DC Plan:  [place]  CM consult      [facility] [place] Choice  HOME HEALTH   Choice offered to / List presented to:  C-1 Patient   DME arranged  [facility]      DME agency  [place] arranged  HH-1 RN  [place].   Status of service:  Completed, signed off Medicare Important Message given?   (If response is "NO", the following Medicare IM given date fields will be blank) Date Medicare IM given:   Date Additional Medicare IM given:    Discharge Disposition:  [place]  Per UR Regulation:  Reviewed for med. necessity/level of care/duration of stay  If discussed at [place] of Stay Meetings, dates discussed:    Comments:  09/30/13 Ellan Lambert, RN, BSN [phone] Pt for dc home today; daughter to provide care at home for a short while until pt is improved.  Will need home oxygen, as cont to desaturate with activity.  Would benefit from [facility] for CHF follow up.  HHPT recommended, though unable to arrange, as not covered by Medicaid for this diagnosis.  Pt aware, and states he will cont walking and increasing his activity as tolerated.  Denies any DME needs for home. Referral to [facility] for home O2 set up; portable tank to be delivered to pt prior to dc.  09/25/13 Ellan Lambert, RN, BSN [phone] Pt on Eliquis currently; listed as no insurance, but daughter states pt has Medicaid (high deductible) but deductible should be met  with this hospital stay.  Free 30 day card given to pt, which has been activated.  Eliquis is on Medicaid formulary, and pt should be able to get filled for $3-4.  Pt has hx of home health with [facility]; would recommend [place] follow up at dc with [facility] for CHF follow up. Pt and dtr in agreement with plan.

## 2013-09-25 NOTE — Progress Notes (Signed)
  Echocardiogram Echocardiogram Transesophageal has been performed.  Basilia Jumbo 09/25/2013, 11:48 AM

## 2013-09-25 NOTE — H&P (View-Only) (Signed)
63 y.o. male with past medical history of severe aortic stenosis s/p tissue AV replacement, parox atrial fibrillation, heart failure, hypertension, and coronary artery disease who presents with shortness of breath that has progressed over the last few months now with severe DOE. + orthopnea in addition to abdominal distention and lower leg swelling.  Also atrial fib with RVR.  He reports intermittent weeping of wounds of lower extremity due to le edema. on admit, he was found to be hypoxic on room air and put on 2 L nasal cannula. CXR showing pulmonary edema. He was admitted for heart failure exacerbation. He was afebrile on admit however has developed high fevers of admit up to 102.3. ID and IM consulted. In addition, he had 2 sets of blood cx showing gpr +LISTERIA.    Subjective: Feels better today, no CP, decreased SOB. +nose bleed. [place] O2.  PCO2 85 yesterday. Bicarb 43.  Objective: Vital signs in last 24 hours: Temp:  [97.5 F (36.4 C)-98.7 F (37.1 C)] 97.9 F (36.6 C) (06/04 0500) Pulse Rate:  [90-105] 100 (06/04 0500) Resp:  [18-19] 18 (06/04 0500) BP: (114-137)/(48-72) 114/72 mmHg (06/04 0500) SpO2:  [92 %-94 %] 94 % (06/04 0500) Weight:  [219 lb 12.8 oz (99.7 kg)] 219 lb 12.8 oz (99.7 kg) (06/04 0500) Weight change: 1 lb 11.2 oz (0.771 kg) Last BM Date: 09/22/13 Intake/Output from previous day: 06/03 0701 - 06/04 0700 In: 1080 [P.O.:1080] Out: 2260 [Urine:2260] Intake/Output this shift:    PE: General:Pleasant affect, NAD Skin:Warm and dry, brisk capillary refill HEENT:normocephalic, sclera clear, mucus membranes moist, dried blood around nares.  Heart:irreg irreg without murmur, gallup, rub or click Lungs: poor air movement B, no active wheeze. Mild increased resp effort.  WUJ:WJXBJ, soft, non tender, + BS,  YNW:GNFAOZHY lower ext edema, right leg dressing in place. Marked improvement.  Neuro:alert and oriented, MAE, follows commands, + facial symmetry     Lab  Results:  Recent Labs  09/24/13 0402 09/25/13 0352  WBC 5.8 5.0  HGB 10.9* 11.2*  HCT 35.7* 36.6*  PLT 172 172   BMET  Recent Labs  09/24/13 0402 09/25/13 0352  NA 139 138  K 3.8 3.7  CL 88* 90*  CO2 43* 42*  GLUCOSE 109* 116*  BUN 46* 44*  CREATININE 2.02* 1.50*  CALCIUM 9.0 8.8     Studies/Results: Mr Thoracic Spine Wo Contrast  09/24/2013   CLINICAL DATA:  63 year old male with bacteremia. Query spinal infection.  Renal insufficiency precludes use of IV contrast at this time.  EXAM: MRI THORACIC AND LUMBAR SPINE WITHOUT CONTRAST  TECHNIQUE: Multiplanar and multiecho pulse sequences of the thoracic and lumbar spine were obtained without intravenous contrast.  COMPARISON:  Portable chest radiograph 09/22/2013 and earlier. Lumbar radiographs 09/20/2013. CTA pelvis 09/23/2008.  FINDINGS: MR THORACIC SPINE FINDINGS  Limited sagittal imaging of the cervical spine is remarkable for widespread chronic disc and endplate degeneration.  Preserved thoracic vertebral height and alignment. No marrow edema or evidence of acute osseous abnormality. No abnormal prevertebral or paraspinal fluid.  Epidural lipomatosis maximal at the T5-T6 level partially effaces CSF from the thecal sac. Occasional small thoracic disc protrusions (T7-T8). Intermittent thoracic facet hypertrophy (moderate at T7-T8 -T10-T11). Borderline to mild thoracic spinal stenosis at T7-T8.  Spinal cord signal is within normal limits at all visualized levels. Conus medullaris demonstrated on the lumbar images.  Posterior paraspinal soft tissues are within normal limits. Grossly negative visualized thoracic viscera.  MR LUMBAR SPINE FINDINGS  Normal lumbar segmentation, congruent with the thoracic spine imaging. Stable lumbar vertebral height and alignment. Minimal inferior endplate marrow edema at T12 appears to be degenerative in nature. No acute osseous abnormality identified.  There is widespread mild increased STIR signal in the  posterior paraspinal muscles (erector spinae muscles bilaterally). This is nonspecific. No paraspinal fluid collection.  Negative visualized abdominal viscera except for irregularity the aorta and evidence of distal aortic bypass.  Visualized lower thoracic spinal cord is normal with conus medularis at L1. Normal cauda equina nerve roots. Normal lumbar epidural space.  Lumbar disc degeneration L3-L4 through L5-S1. No spinal stenosis. Mild to moderate lower lumbar facet hypertrophy with trace facet joint fluid.  IMPRESSION: MR THORACIC SPINE IMPRESSION  1. No evidence of thoracic spine infection. 2. Mild multifactorial degenerative spinal stenosis at T7-T8.  MR LUMBAR SPINE IMPRESSION  1. Abnormal bilateral erector spinae muscle signal, nonspecific and can be commonly seen in debilitated patient's. No strong evidence of infectious myositis at this time. No evidence of lumbar spinal infection. 2. Lower lumbar spine degeneration without lumbar spinal stenosis. 3. Chronic abdominal aortic disease status post aortic bypass.   Electronically Signed   By: Lee  Hall M.D.   On: 09/24/2013 21:45   Mr Lumbar Spine Wo Contrast  09/24/2013   CLINICAL DATA:  63-year-old male with bacteremia. Query spinal infection.  Renal insufficiency precludes use of IV contrast at this time.  EXAM: MRI THORACIC AND LUMBAR SPINE WITHOUT CONTRAST  TECHNIQUE: Multiplanar and multiecho pulse sequences of the thoracic and lumbar spine were obtained without intravenous contrast.  COMPARISON:  Portable chest radiograph 09/22/2013 and earlier. Lumbar radiographs 09/20/2013. CTA pelvis 09/23/2008.  FINDINGS: MR THORACIC SPINE FINDINGS  Limited sagittal imaging of the cervical spine is remarkable for widespread chronic disc and endplate degeneration.  Preserved thoracic vertebral height and alignment. No marrow edema or evidence of acute osseous abnormality. No abnormal prevertebral or paraspinal fluid.  Epidural lipomatosis maximal at the T5-T6 level  partially effaces CSF from the thecal sac. Occasional small thoracic disc protrusions (T7-T8). Intermittent thoracic facet hypertrophy (moderate at T7-T8 -T10-T11). Borderline to mild thoracic spinal stenosis at T7-T8.  Spinal cord signal is within normal limits at all visualized levels. Conus medullaris demonstrated on the lumbar images.  Posterior paraspinal soft tissues are within normal limits. Grossly negative visualized thoracic viscera.  MR LUMBAR SPINE FINDINGS  Normal lumbar segmentation, congruent with the thoracic spine imaging. Stable lumbar vertebral height and alignment. Minimal inferior endplate marrow edema at T12 appears to be degenerative in nature. No acute osseous abnormality identified.  There is widespread mild increased STIR signal in the posterior paraspinal muscles (erector spinae muscles bilaterally). This is nonspecific. No paraspinal fluid collection.  Negative visualized abdominal viscera except for irregularity the aorta and evidence of distal aortic bypass.  Visualized lower thoracic spinal cord is normal with conus medularis at L1. Normal cauda equina nerve roots. Normal lumbar epidural space.  Lumbar disc degeneration L3-L4 through L5-S1. No spinal stenosis. Mild to moderate lower lumbar facet hypertrophy with trace facet joint fluid.  IMPRESSION: MR THORACIC SPINE IMPRESSION  1. No evidence of thoracic spine infection. 2. Mild multifactorial degenerative spinal stenosis at T7-T8.  MR LUMBAR SPINE IMPRESSION  1. Abnormal bilateral erector spinae muscle signal, nonspecific and can be commonly seen in debilitated patient's. No strong evidence of infectious myositis at this time. No evidence of lumbar spinal infection. 2. Lower lumbar spine degeneration without lumbar spinal stenosis. 3. Chronic abdominal aortic disease status post aortic   bypass.   Electronically Signed   By: Lee  Hall M.D.   On: 09/24/2013 21:45    Medications: I have reviewed the patient's current  medications. Scheduled Meds: . amiodarone  200 mg Oral BID  . ampicillin (OMNIPEN) IV  2 g Intravenous 4 times per day  . apixaban  5 mg Oral BID  . atorvastatin  20 mg Oral q1800  . carvedilol  25 mg Oral BID WC  . folic acid  1 mg Oral Daily  . multivitamin with minerals  1 tablet Oral Daily  . potassium chloride SA  20 mEq Oral Daily  . sodium chloride  3 mL Intravenous Q12H  . thiamine  100 mg Oral Daily   Or  . thiamine  100 mg Intravenous Daily   Continuous Infusions: . sodium chloride 20 mL/hr at 09/24/13 1916   PRN Meds:.sodium chloride, acetaminophen, albuterol, LORazepam, LORazepam, sodium chloride  Assessment/Plan: 63 year old male with AS s/p AVR/CABG(SVG to PDA and PL) /MAZE(10/08/12), pAF on amiodarone, COPD who is admitted with volume overload, fever, now with Listeria blood CX.   -  Acute decompensated diastolic heart failure,improved with high dose lasix and metolazone. Currently on hold  secondary to increase in creat and BUN. 2.02 down to 1.5, improved. Prior creat in 11/14 was 1.2. On admit creat was 1.4.   - 8.3 liters out - Wt 240 on admit, now 218.  Tomorrow will likely restart maintenance lasix PO.    Heart failure team consult noted. Appreciate their assistance as well. Reviewed note.   -  Atrial fibrillation/flutter with RVR-- currently HR 110 regular and upon careful inspection of tele appears to be 2:1 aflutter conduction.  -last DCCV in Nov.2014 (did not hold), Still on AMIO.  TEE/CV today, will attempt cardioversion as well to give him one last shot at rhythm control since he has been on AMIO long term. If he fails again (high likelihood with other comorbidities) continue with rate control. Stopped digoxin with worsening renal function.   -  Listeria +blood cultures -  ID and IM, ABX AMP.  TEE    -  COPD (chronic obstructive pulmonary disease) - hypercarbia chronic. Quite significant. Mentating well. Poor air movement. Will discuss with Dr. Vann about  other treatments.    -  Anticoagulation on Eliquis (Restart) - had break in anticoagulation as outpatient.   -  Cellulitis, chronic lower ext-dsg in place  -  Debilitation, has never increased his energy to walk without walker, PT    LOS: 6 days     Joram Venson, MD  

## 2013-09-25 NOTE — Progress Notes (Addendum)
PROGRESS NOTE  [name] VVO:[id number] DOB: January 20, 1951 DOA: 09/19/2013 PCP: No PCP Per Patient  Assessment/Plan: Gram + rod bacteremia - listeria -MRI spine: MR THORACIC SPINE IMPRESSION  1. No evidence of thoracic spine infection.  2. Mild multifactorial degenerative spinal stenosis at T7-T8.  MR LUMBAR SPINE IMPRESSION  1. Abnormal bilateral erector spinae muscle signal, nonspecific and  can be commonly seen in debilitated patient's. No strong evidence of  infectious myositis at this time. No evidence of lumbar spinal  infection.  2. Lower lumbar spine degeneration without lumbar spinal stenosis.  3. Chronic abdominal aortic disease status post aortic bypass. -abx per ID -TEE   Back pain -MRI- see above -could add lidocaine patch if continues  Alcohol abuse -CIWA  Atrial fibrillation with RVR  Currently rate controlled on amiodarone, carvedilol. His CHADS2 score is 2.   Acute decompensated heart failure  Lasix being held for now  On aspirin and digoxin as well. Has negative balance.   COPD (chronic obstructive pulmonary disease)  No signs of exacerbation. Continue O2 via nasal cannula and albuterol nebs  - add atrovent neb whole here- PFTs in 2014 showed severe emphysema- was given Breo with as needed albuterol- resume this at d/c  CAD  On aspirin, statin and carvedilol   Deconditioning  PT eval - home health?  Chronic Left leg cellulitis and peripheral vascular disease  No signs of acute infection and as per pt the leg appears much better now. Dressing as per wound care consult  Duplex negative for DVT  Acute kidney injury  Has good urine output. Continue to monitor   Code Status: full Family Communication: patient/daughter at bedside Disposition Plan:    HPI/Subjective: not sleeping well but no other c/o   Objective: Filed Vitals:   09/25/13 0500  BP: 114/72  Pulse: 100  Temp: 97.9 F (36.6 C)  Resp: 18    Intake/Output Summary (Last  24 hours) at 09/25/13 0922 Last data filed at 09/25/13 0300  Gross per 24 hour  Intake    840 ml  Output   1660 ml  Net   -820 ml   Filed Weights   09/23/13 0455 09/24/13 0525 09/25/13 0500  Weight: 103.5 kg (228 lb 2.8 oz) 98.93 kg (218 lb 1.6 oz) 99.7 kg (219 lb 12.8 oz)    Exam:   General:  Pleasant/cooperative- not oriented to time  Cardiovascular: irr  Respiratory: decrease breath sounds  Abdomen: +Bs, soft  Musculoskeletal: moves all 4 ext   Data Reviewed: Basic Metabolic Panel:  Recent Labs Lab 09/20/13 0054 09/21/13 0405 09/22/13 0450 09/24/13 0402 09/25/13 0352  NA 139 139 139 139 138  K 4.2 4.5 4.8 3.8 3.7  CL 98 95* 96 88* 90*  CO2 32 36* 33* 43* 42*  GLUCOSE 108* 109* 120* 109* 116*  BUN 18 24* 26* 46* 44*  CREATININE 1.41* 1.61* 1.78* 2.02* 1.50*  CALCIUM 8.7 8.7 8.4 9.0 8.8   Liver Function Tests:  Recent Labs Lab 09/19/13 2009  AST 50*  ALT 46  ALKPHOS 65  BILITOT 0.4  PROT 7.6  ALBUMIN 2.7*   No results found for this basename: LIPASE, AMYLASE,  in the last 168 hours No results found for this basename: AMMONIA,  in the last 168 hours CBC:  Recent Labs Lab 09/21/13 0405 09/22/13 0450 09/23/13 0349 09/24/13 0402 09/25/13 0352  WBC 5.4 5.0 5.1 5.8 5.0  HGB 10.8* 11.0* 11.0* 10.9* 11.2*  HCT 36.7* 36.6* 37.0* 35.7* 36.6*  MCV 89.3 89.5 89.4 88.8 89.5  PLT 182 177 170 172 172   Cardiac Enzymes: No results found for this basename: CKTOTAL, CKMB, CKMBINDEX, TROPONINI,  in the last 168 hours BNP (last 3 results)  Recent Labs  10/01/12 0415 09/19/13 1941 09/24/13 0400  PROBNP 11193.0* 3530.0* 1792.0*   CBG: No results found for this basename: GLUCAP,  in the last 168 hours  Recent Results (from the past 240 hour(s))  CULTURE, BLOOD (ROUTINE X 2)     Status: None   Collection Time    09/21/13 10:11 AM      Result Value Ref Range Status   Specimen Description BLOOD RIGHT ARM   Final   Special Requests BOTTLES DRAWN  AEROBIC AND ANAEROBIC 5CC   Final   Culture  Setup Time     Final   Value: 09/21/2013 18:11     Performed at [facility]   Culture     Final   Value: LISTERIA MONOCYTOGENES     Note: Recommended therapy: ampicillin with or without an aminoglycoside. Cephalosporins are not effective. CRITICAL RESULT CALLED TO, READ BACK BY AND VERIFIED WITH: OLIVIA STEIN 09/24/13 0800 BY SMITHERSJ FAXED TO [place] [facility] CONNIE WEANT [id number] BY      PEAKY     Note: Gram Stain Report Called to,Read Back By and Verified With: HEATHER B BY INGRAM A 09/22/13 1205PM     Performed at [facility]   Report Status 09/25/2013 FINAL   Final  CULTURE, BLOOD (ROUTINE X 2)     Status: None   Collection Time    09/21/13 10:16 AM      Result Value Ref Range Status   Specimen Description BLOOD RIGHT ARM   Final   Special Requests BOTTLES DRAWN AEROBIC AND ANAEROBIC 5CC   Final   Culture  Setup Time     Final   Value: 09/21/2013 18:11     Performed at [facility]   Culture     Final   Value: LISTERIA MONOCYTOGENES     Note: Recommended therapy: ampicillin with or without an aminoglycoside. Cephalosporins are not effective. CRITICAL RESULT CALLED TO, READ BACK BY AND VERIFIED WITH: OLIVIA STEIN 09/24/13 0800 BY SMITHERSJ FAXED TO [facility] CONNIE WEANT [id number] BY      PEAKY     Note: Gram Stain Report Called to,Read Back By and Verified With: HEATHER B BY INGRAM A 09/22/13 1205PM     Performed at [facility]   Report Status 09/25/2013 FINAL   Final  URINE CULTURE     Status: None   Collection Time    09/21/13 10:24 AM      Result Value Ref Range Status   Specimen Description URINE, RANDOM   Final   Special Requests Normal   Final   Culture  Setup Time     Final   Value: 09/21/2013 18:41     Performed at [place]     Final   Value: 25,000 COLONIES/ML     Performed at [facility]   Culture     Final   Value: Multiple bacterial morphotypes present,  none predominant. Suggest appropriate recollection if clinically indicated.     Performed at [facility]   Report Status 09/22/2013 FINAL   Final  CULTURE, BLOOD (ROUTINE X 2)     Status: None   Collection Time    09/22/13  1:56 PM  Result Value Ref Range Status   Specimen Description BLOOD LEFT HAND   Final   Special Requests     Final   Value: BOTTLES DRAWN AEROBIC AND ANAEROBIC 10CC BLUE  8CC RED   Culture  Setup Time     Final   Value: 09/22/2013 18:23     Performed at [facility]   Culture     Final   Value: GRAM POSITIVE RODS     Note: Gram Stain Report Called to,Read Back By and Verified With: HEATHER B@[id number]  ON [id number] BY [facility]     Performed at [facility]   Report Status PENDING   Incomplete  CULTURE, BLOOD (ROUTINE X 2)     Status: None   Collection Time    09/22/13  2:02 PM      Result Value Ref Range Status   Specimen Description BLOOD RIGHT HAND   Final   Special Requests BOTTLES DRAWN AEROBIC ONLY 10CC   Final   Culture  Setup Time     Final   Value: 09/22/2013 18:23     Performed at [facility]   Culture     Final   Value: GRAM POSITIVE RODS     Note: Gram Stain Report Called to,Read Back By and Verified With: BREANA DAVIS ON 09/23/2013 AT 7:21P BY Dennard Nip     Performed at [facility]   Report Status PENDING   Incomplete     Studies: Mr Thoracic Spine Wo Contrast  09/24/2013   CLINICAL DATA:  63 year old male with bacteremia. Query spinal infection.  Renal insufficiency precludes use of IV contrast at this time.  EXAM: MRI THORACIC AND LUMBAR SPINE WITHOUT CONTRAST  TECHNIQUE: Multiplanar and multiecho pulse sequences of the thoracic and lumbar spine were obtained without intravenous contrast.  COMPARISON:  Portable chest radiograph 09/22/2013 and earlier. Lumbar radiographs 09/20/2013. CTA pelvis 09/23/2008.  FINDINGS: MR THORACIC SPINE FINDINGS  Limited sagittal imaging of the cervical spine is remarkable for widespread  chronic disc and endplate degeneration.  Preserved thoracic vertebral height and alignment. No marrow edema or evidence of acute osseous abnormality. No abnormal prevertebral or paraspinal fluid.  Epidural lipomatosis maximal at the T5-T6 level partially effaces CSF from the thecal sac. Occasional small thoracic disc protrusions (T7-T8). Intermittent thoracic facet hypertrophy (moderate at T7-T8 -T10-T11). Borderline to mild thoracic spinal stenosis at T7-T8.  Spinal cord signal is within normal limits at all visualized levels. Conus medullaris demonstrated on the lumbar images.  Posterior paraspinal soft tissues are within normal limits. Grossly negative visualized thoracic viscera.  MR LUMBAR SPINE FINDINGS  Normal lumbar segmentation, congruent with the thoracic spine imaging. Stable lumbar vertebral height and alignment. Minimal inferior endplate marrow edema at T12 appears to be degenerative in nature. No acute osseous abnormality identified.  There is widespread mild increased STIR signal in the posterior paraspinal muscles (erector spinae muscles bilaterally). This is nonspecific. No paraspinal fluid collection.  Negative visualized abdominal viscera except for irregularity the aorta and evidence of distal aortic bypass.  Visualized lower thoracic spinal cord is normal with conus medularis at L1. Normal cauda equina nerve roots. Normal lumbar epidural space.  Lumbar disc degeneration L3-L4 through L5-S1. No spinal stenosis. Mild to moderate lower lumbar facet hypertrophy with trace facet joint fluid.  IMPRESSION: MR THORACIC SPINE IMPRESSION  1. No evidence of thoracic spine infection. 2. Mild multifactorial degenerative spinal stenosis at T7-T8.  MR LUMBAR SPINE IMPRESSION  1. Abnormal bilateral erector spinae muscle signal, nonspecific  and can be commonly seen in debilitated patient's. No strong evidence of infectious myositis at this time. No evidence of lumbar spinal infection. 2. Lower lumbar spine  degeneration without lumbar spinal stenosis. 3. Chronic abdominal aortic disease status post aortic bypass.   Electronically Signed   By: Lars Pinks M.D.   On: 09/24/2013 21:45   Mr Lumbar Spine Wo Contrast  09/24/2013   CLINICAL DATA:  63 year old male with bacteremia. Query spinal infection.  Renal insufficiency precludes use of IV contrast at this time.  EXAM: MRI THORACIC AND LUMBAR SPINE WITHOUT CONTRAST  TECHNIQUE: Multiplanar and multiecho pulse sequences of the thoracic and lumbar spine were obtained without intravenous contrast.  COMPARISON:  Portable chest radiograph 09/22/2013 and earlier. Lumbar radiographs 09/20/2013. CTA pelvis 09/23/2008.  FINDINGS: MR THORACIC SPINE FINDINGS  Limited sagittal imaging of the cervical spine is remarkable for widespread chronic disc and endplate degeneration.  Preserved thoracic vertebral height and alignment. No marrow edema or evidence of acute osseous abnormality. No abnormal prevertebral or paraspinal fluid.  Epidural lipomatosis maximal at the T5-T6 level partially effaces CSF from the thecal sac. Occasional small thoracic disc protrusions (T7-T8). Intermittent thoracic facet hypertrophy (moderate at T7-T8 -T10-T11). Borderline to mild thoracic spinal stenosis at T7-T8.  Spinal cord signal is within normal limits at all visualized levels. Conus medullaris demonstrated on the lumbar images.  Posterior paraspinal soft tissues are within normal limits. Grossly negative visualized thoracic viscera.  MR LUMBAR SPINE FINDINGS  Normal lumbar segmentation, congruent with the thoracic spine imaging. Stable lumbar vertebral height and alignment. Minimal inferior endplate marrow edema at T12 appears to be degenerative in nature. No acute osseous abnormality identified.  There is widespread mild increased STIR signal in the posterior paraspinal muscles (erector spinae muscles bilaterally). This is nonspecific. No paraspinal fluid collection.  Negative visualized abdominal  viscera except for irregularity the aorta and evidence of distal aortic bypass.  Visualized lower thoracic spinal cord is normal with conus medularis at L1. Normal cauda equina nerve roots. Normal lumbar epidural space.  Lumbar disc degeneration L3-L4 through L5-S1. No spinal stenosis. Mild to moderate lower lumbar facet hypertrophy with trace facet joint fluid.  IMPRESSION: MR THORACIC SPINE IMPRESSION  1. No evidence of thoracic spine infection. 2. Mild multifactorial degenerative spinal stenosis at T7-T8.  MR LUMBAR SPINE IMPRESSION  1. Abnormal bilateral erector spinae muscle signal, nonspecific and can be commonly seen in debilitated patient's. No strong evidence of infectious myositis at this time. No evidence of lumbar spinal infection. 2. Lower lumbar spine degeneration without lumbar spinal stenosis. 3. Chronic abdominal aortic disease status post aortic bypass.   Electronically Signed   By: Lars Pinks M.D.   On: 09/24/2013 21:45    Scheduled Meds: . amiodarone  200 mg Oral BID  . ampicillin (OMNIPEN) IV  2 g Intravenous 4 times per day  . apixaban  5 mg Oral BID  . atorvastatin  20 mg Oral q1800  . carvedilol  25 mg Oral BID WC  . folic acid  1 mg Oral Daily  . multivitamin with minerals  1 tablet Oral Daily  . potassium chloride SA  20 mEq Oral Daily  . sodium chloride  3 mL Intravenous Q12H  . thiamine  100 mg Oral Daily   Or  . thiamine  100 mg Intravenous Daily   Continuous Infusions: . sodium chloride 20 mL/hr at 09/24/13 1916   Antibiotics Given (last 72 hours)   Date/Time Action Medication Dose Rate   09/22/13  1456 Given   ceFEPIme (MAXIPIME) 2 g in dextrose 5 % 50 mL IVPB 2 g 100 mL/hr   09/22/13 1605 Given   vancomycin (VANCOCIN) 2,000 mg in sodium chloride 0.9 % 500 mL IVPB 2,000 mg 250 mL/hr   09/23/13 1257 Given   ceFEPIme (MAXIPIME) 2 g in dextrose 5 % 50 mL IVPB 2 g 100 mL/hr   09/23/13 1653 Given   ampicillin (OMNIPEN) 2 g in sodium chloride 0.9 % 50 mL IVPB 2 g  150 mL/hr   09/23/13 2309 Given   ampicillin (OMNIPEN) 2 g in sodium chloride 0.9 % 50 mL IVPB 2 g 150 mL/hr   09/24/13 [id number] Given   ampicillin (OMNIPEN) 2 g in sodium chloride 0.9 % 50 mL IVPB 2 g 150 mL/hr   09/24/13 1101 Given   ampicillin (OMNIPEN) 2 g in sodium chloride 0.9 % 50 mL IVPB 2 g 150 mL/hr   09/24/13 1700 Given   ampicillin (OMNIPEN) 2 g in sodium chloride 0.9 % 50 mL IVPB 2 g 150 mL/hr   09/25/13 0052 Given   ampicillin (OMNIPEN) 2 g in sodium chloride 0.9 % 50 mL IVPB 2 g 150 mL/hr   09/25/13 0525 Given   ampicillin (OMNIPEN) 2 g in sodium chloride 0.9 % 50 mL IVPB 2 g 150 mL/hr      Principal Problem:   Acute decompensated heart failure Active Problems:   Atrial fibrillation with RVR   Fever of unknown origin (FUO)   COPD (chronic obstructive pulmonary disease)    Time spent: 35 min    [facility] Pager [phone]. If 7PM-7AM, please contact night-coverage at [email or web address], password [facility] 09/25/2013, 9:22 AM  LOS: 6 days

## 2013-09-25 NOTE — Progress Notes (Signed)
Call received from Lab. They inform me that pt. Has critical lab result.  They report C02 level is 42.   Results called to Dr. Tally Joe.  No new orders received.    Pt. Is awake and sitting up in bed.  VS Temp 97.9 Resp-18,  HR-100, BP-114/72.  Spo2 94 % on O2 @ 2L per [place].  Will continue to monitor.

## 2013-09-25 NOTE — Anesthesia Preprocedure Evaluation (Signed)
Anesthesia Evaluation  Patient identified by MRN, date of birth, ID band Patient awake    Reviewed: Allergy & Precautions, H&P , NPO status , Patient's Chart, lab work & pertinent test results  Airway Mallampati: II  Neck ROM: full    Dental   Pulmonary COPDformer smoker,          Cardiovascular hypertension, + CAD, + CABG, + Peripheral Vascular Disease and +CHF + dysrhythmias Atrial Fibrillation + Valvular Problems/Murmurs AS  S/p AVR.   Neuro/Psych    GI/Hepatic   Endo/Other  obese  Renal/GU Renal InsufficiencyRenal disease     Musculoskeletal   Abdominal   Peds  Hematology   Anesthesia Other Findings   Reproductive/Obstetrics                           Anesthesia Physical Anesthesia Plan  ASA: III  Anesthesia Plan: MAC   Post-op Pain Management:    Induction: Intravenous  Airway Management Planned: Nasal Cannula  Additional Equipment:   Intra-op Plan:   Post-operative Plan:   Informed Consent: I have reviewed the patients History and Physical, chart, labs and discussed the procedure including the risks, benefits and alternatives for the proposed anesthesia with the patient or authorized representative who has indicated his/her understanding and acceptance.     Plan Discussed with: CRNA, Anesthesiologist and Surgeon  Anesthesia Plan Comments:         Anesthesia Quick Evaluation

## 2013-09-25 NOTE — CV Procedure (Signed)
    Transesophageal Echocardiogram Note  EVYN PUTZIER [id number] 03-17-1951  Procedure: Transesophageal Echocardiogram Indications: atrial flutter  Procedure Details Consent: Obtained Time Out: Verified patient identification, verified procedure, site/side was marked, verified correct patient position, special equipment/implants available, Radiology Safety Procedures followed,  medications/allergies/relevent history reviewed, required imaging and test results available.  Performed  Medications: Fentanyl: 100 mcg iv Versed: 2 mg iv Propofol  100 mg iv infusion total  Left Ventrical:  Lower limit of normal EF - ~ 50%  Mitral Valve: normal  Aortic Valve: bioprosthetic AV.    Tricuspid Valve:   Pulmonic Valve:   Left Atrium/ Left atrial appendage: the LAA has been surgically sutured and no LAA cavity was identified.   Atrial septum: intact  Aorta: mild plaque   Complications: No apparent complications Patient did tolerate procedure well.  Will proceed with cardioversion   Cardioversion Note  NEVILLE WALSTON [id number] [birth year]  Procedure: DC Cardioversion Indications: atrial flutter  Procedure Details Consent: Obtained Time Out: Verified patient identification, verified procedure, site/side was marked, verified correct patient position, special equipment/implants available, Radiology Safety Procedures followed,  medications/allergies/relevent history reviewed, required imaging and test results available.  Performed  The patient has been on adequate anticoagulation.  The patient received IV ( see above, no addition meds)  for sedation.  Synchronous cardioversion was performed at 30 joules.  The cardioversion was successful     Complications: No apparent complications Patient did tolerate procedure well.   Thayer Headings, Brooke Bonito., MD, [facility] 09/25/2013, 11:43 AM        Thayer Headings, Brooke Bonito., MD, [facility] 09/25/2013, 11:41 AM

## 2013-09-25 NOTE — Interval H&P Note (Signed)
History and Physical Interval Note:  09/25/2013 11:17 AM  [name]  has presented today for surgery, with the diagnosis of afib  The various methods of treatment have been discussed with the patient and family. After consideration of risks, benefits and other options for treatment, the patient has consented to  Procedure(s): TRANSESOPHAGEAL ECHOCARDIOGRAM (TEE) (N/A) CARDIOVERSION (N/A) as a surgical intervention .  The patient's history has been reviewed, patient examined, no change in status, stable for surgery.  I have reviewed the patient's chart and labs.  Questions were answered to the patient's satisfaction.    Pt's rhythm is very regular.  Has been found to atrial flutter with 2:1 AV block. Proceed with TEE / CV  Wonda Cheng Irlene Crudup

## 2013-09-25 NOTE — Progress Notes (Addendum)
63 y.o. male with past medical history of severe aortic stenosis s/p tissue AV replacement, parox atrial fibrillation, heart failure, hypertension, and coronary artery disease who presents with shortness of breath that has progressed over the last few months now with severe DOE. + orthopnea in addition to abdominal distention and lower leg swelling.  Also atrial fib with RVR.  He reports intermittent weeping of wounds of lower extremity due to le edema. on admit, he was found to be hypoxic on room air and put on 2 L nasal cannula. CXR showing pulmonary edema. He was admitted for heart failure exacerbation. He was afebrile on admit however has developed high fevers of admit up to 102.3. ID and IM consulted. In addition, he had 2 sets of blood cx showing gpr +LISTERIA.    Subjective: Feels better today, no CP, decreased SOB. +nose bleed. [place] O2.  PCO2 85 yesterday. Bicarb 43.  Objective: Vital signs in last 24 hours: Temp:  [97.5 F (36.4 C)-98.7 F (37.1 C)] 97.9 F (36.6 C) (06/04 0500) Pulse Rate:  [90-105] 100 (06/04 0500) Resp:  [18-19] 18 (06/04 0500) BP: (114-137)/(48-72) 114/72 mmHg (06/04 0500) SpO2:  [92 %-94 %] 94 % (06/04 0500) Weight:  [219 lb 12.8 oz (99.7 kg)] 219 lb 12.8 oz (99.7 kg) (06/04 0500) Weight change: 1 lb 11.2 oz (0.771 kg) Last BM Date: 09/22/13 Intake/Output from previous day: 06/03 0701 - 06/04 0700 In: 1080 [P.O.:1080] Out: 2260 [Urine:2260] Intake/Output this shift:    PE: General:Pleasant affect, NAD Skin:Warm and dry, brisk capillary refill HEENT:normocephalic, sclera clear, mucus membranes moist, dried blood around nares.  Heart:irreg irreg without murmur, gallup, rub or click Lungs: poor air movement B, no active wheeze. Mild increased resp effort.  WUJ:WJXBJ, soft, non tender, + BS,  YNW:GNFAOZHY lower ext edema, right leg dressing in place. Marked improvement.  Neuro:alert and oriented, MAE, follows commands, + facial symmetry     Lab  Results:  Recent Labs  09/24/13 0402 09/25/13 0352  WBC 5.8 5.0  HGB 10.9* 11.2*  HCT 35.7* 36.6*  PLT 172 172   BMET  Recent Labs  09/24/13 0402 09/25/13 0352  NA 139 138  K 3.8 3.7  CL 88* 90*  CO2 43* 42*  GLUCOSE 109* 116*  BUN 46* 44*  CREATININE 2.02* 1.50*  CALCIUM 9.0 8.8     Studies/Results: Mr Thoracic Spine Wo Contrast  09/24/2013   CLINICAL DATA:  63 year old male with bacteremia. Query spinal infection.  Renal insufficiency precludes use of IV contrast at this time.  EXAM: MRI THORACIC AND LUMBAR SPINE WITHOUT CONTRAST  TECHNIQUE: Multiplanar and multiecho pulse sequences of the thoracic and lumbar spine were obtained without intravenous contrast.  COMPARISON:  Portable chest radiograph 09/22/2013 and earlier. Lumbar radiographs 09/20/2013. CTA pelvis 09/23/2008.  FINDINGS: MR THORACIC SPINE FINDINGS  Limited sagittal imaging of the cervical spine is remarkable for widespread chronic disc and endplate degeneration.  Preserved thoracic vertebral height and alignment. No marrow edema or evidence of acute osseous abnormality. No abnormal prevertebral or paraspinal fluid.  Epidural lipomatosis maximal at the T5-T6 level partially effaces CSF from the thecal sac. Occasional small thoracic disc protrusions (T7-T8). Intermittent thoracic facet hypertrophy (moderate at T7-T8 -T10-T11). Borderline to mild thoracic spinal stenosis at T7-T8.  Spinal cord signal is within normal limits at all visualized levels. Conus medullaris demonstrated on the lumbar images.  Posterior paraspinal soft tissues are within normal limits. Grossly negative visualized thoracic viscera.  MR LUMBAR SPINE FINDINGS  Normal lumbar segmentation, congruent with the thoracic spine imaging. Stable lumbar vertebral height and alignment. Minimal inferior endplate marrow edema at T12 appears to be degenerative in nature. No acute osseous abnormality identified.  There is widespread mild increased STIR signal in the  posterior paraspinal muscles (erector spinae muscles bilaterally). This is nonspecific. No paraspinal fluid collection.  Negative visualized abdominal viscera except for irregularity the aorta and evidence of distal aortic bypass.  Visualized lower thoracic spinal cord is normal with conus medularis at L1. Normal cauda equina nerve roots. Normal lumbar epidural space.  Lumbar disc degeneration L3-L4 through L5-S1. No spinal stenosis. Mild to moderate lower lumbar facet hypertrophy with trace facet joint fluid.  IMPRESSION: MR THORACIC SPINE IMPRESSION  1. No evidence of thoracic spine infection. 2. Mild multifactorial degenerative spinal stenosis at T7-T8.  MR LUMBAR SPINE IMPRESSION  1. Abnormal bilateral erector spinae muscle signal, nonspecific and can be commonly seen in debilitated patient's. No strong evidence of infectious myositis at this time. No evidence of lumbar spinal infection. 2. Lower lumbar spine degeneration without lumbar spinal stenosis. 3. Chronic abdominal aortic disease status post aortic bypass.   Electronically Signed   By: Lars Pinks M.D.   On: 09/24/2013 21:45   Mr Lumbar Spine Wo Contrast  09/24/2013   CLINICAL DATA:  63 year old male with bacteremia. Query spinal infection.  Renal insufficiency precludes use of IV contrast at this time.  EXAM: MRI THORACIC AND LUMBAR SPINE WITHOUT CONTRAST  TECHNIQUE: Multiplanar and multiecho pulse sequences of the thoracic and lumbar spine were obtained without intravenous contrast.  COMPARISON:  Portable chest radiograph 09/22/2013 and earlier. Lumbar radiographs 09/20/2013. CTA pelvis 09/23/2008.  FINDINGS: MR THORACIC SPINE FINDINGS  Limited sagittal imaging of the cervical spine is remarkable for widespread chronic disc and endplate degeneration.  Preserved thoracic vertebral height and alignment. No marrow edema or evidence of acute osseous abnormality. No abnormal prevertebral or paraspinal fluid.  Epidural lipomatosis maximal at the T5-T6 level  partially effaces CSF from the thecal sac. Occasional small thoracic disc protrusions (T7-T8). Intermittent thoracic facet hypertrophy (moderate at T7-T8 -T10-T11). Borderline to mild thoracic spinal stenosis at T7-T8.  Spinal cord signal is within normal limits at all visualized levels. Conus medullaris demonstrated on the lumbar images.  Posterior paraspinal soft tissues are within normal limits. Grossly negative visualized thoracic viscera.  MR LUMBAR SPINE FINDINGS  Normal lumbar segmentation, congruent with the thoracic spine imaging. Stable lumbar vertebral height and alignment. Minimal inferior endplate marrow edema at T12 appears to be degenerative in nature. No acute osseous abnormality identified.  There is widespread mild increased STIR signal in the posterior paraspinal muscles (erector spinae muscles bilaterally). This is nonspecific. No paraspinal fluid collection.  Negative visualized abdominal viscera except for irregularity the aorta and evidence of distal aortic bypass.  Visualized lower thoracic spinal cord is normal with conus medularis at L1. Normal cauda equina nerve roots. Normal lumbar epidural space.  Lumbar disc degeneration L3-L4 through L5-S1. No spinal stenosis. Mild to moderate lower lumbar facet hypertrophy with trace facet joint fluid.  IMPRESSION: MR THORACIC SPINE IMPRESSION  1. No evidence of thoracic spine infection. 2. Mild multifactorial degenerative spinal stenosis at T7-T8.  MR LUMBAR SPINE IMPRESSION  1. Abnormal bilateral erector spinae muscle signal, nonspecific and can be commonly seen in debilitated patient's. No strong evidence of infectious myositis at this time. No evidence of lumbar spinal infection. 2. Lower lumbar spine degeneration without lumbar spinal stenosis. 3. Chronic abdominal aortic disease status post aortic  bypass.   Electronically Signed   By: Lars Pinks M.D.   On: 09/24/2013 21:45    Medications: I have reviewed the patient's current  medications. Scheduled Meds: . amiodarone  200 mg Oral BID  . ampicillin (OMNIPEN) IV  2 g Intravenous 4 times per day  . apixaban  5 mg Oral BID  . atorvastatin  20 mg Oral q1800  . carvedilol  25 mg Oral BID WC  . folic acid  1 mg Oral Daily  . multivitamin with minerals  1 tablet Oral Daily  . potassium chloride SA  20 mEq Oral Daily  . sodium chloride  3 mL Intravenous Q12H  . thiamine  100 mg Oral Daily   Or  . thiamine  100 mg Intravenous Daily   Continuous Infusions: . sodium chloride 20 mL/hr at 09/24/13 1916   PRN Meds:.sodium chloride, acetaminophen, albuterol, LORazepam, LORazepam, sodium chloride  Assessment/Plan: 63 year old male with AS s/p AVR/CABG(SVG to PDA and PL) /MAZE(10/08/12), pAF on amiodarone, COPD who is admitted with volume overload, fever, now with Listeria blood CX.   -  Acute decompensated diastolic heart failure,improved with high dose lasix and metolazone. Currently on hold  secondary to increase in creat and BUN. 2.02 down to 1.5, improved. Prior creat in 11/14 was 1.2. On admit creat was 1.4.   - 8.3 liters out - Wt 240 on admit, now 218.  Tomorrow will likely restart maintenance lasix PO.    Heart failure team consult noted. Appreciate their assistance as well. Reviewed note.   -  Atrial fibrillation/flutter with RVR-- currently HR 110 regular and upon careful inspection of tele appears to be 2:1 aflutter conduction.  -last DCCV in Nov.2014 (did not hold), Still on AMIO.  TEE/CV today, will attempt cardioversion as well to give him one last shot at rhythm control since he has been on AMIO long term. If he fails again (high likelihood with other comorbidities) continue with rate control. Stopped digoxin with worsening renal function.   -  Listeria +blood cultures -  ID and IM, ABX AMP.  TEE    -  COPD (chronic obstructive pulmonary disease) - hypercarbia chronic. Quite significant. Mentating well. Poor air movement. Will discuss with Dr. Eliseo Squires about  other treatments.    -  Anticoagulation on Eliquis (Restart) - had break in anticoagulation as outpatient.   -  Cellulitis, chronic lower ext-dsg in place  -  Debilitation, has never increased his energy to walk without walker, PT    LOS: 6 days     Candee Furbish, MD

## 2013-09-25 NOTE — Progress Notes (Signed)
[place] for apixaban Indication: atrial fibrillation  No Known Allergies  Patient Measurements: Height: 5\' 10"  (177.8 cm) Weight: 219 lb 12.8 oz (99.7 kg) IBW/kg (Calculated) : 73  Vital Signs: Temp: 98 F (36.7 C) (06/04 1042) Temp src: Oral (06/04 1042) BP: 145/69 mmHg (06/04 1042) Pulse Rate: 109 (06/04 1042)  Labs:  Recent Labs  09/23/13 0349 09/24/13 0402 09/25/13 0352  HGB 11.0* 10.9* 11.2*  HCT 37.0* 35.7* 36.6*  PLT 170 172 172  HEPARINUNFRC 0.65  --   --   CREATININE  --  2.02* 1.50*    Estimated Creatinine Clearance: 58.9 ml/min (by C-G formula based on Cr of 1.5).   Assessment: 69 YOM with hx of AFib, aortic stenosis s/p tissue AV replacement and CHF, transitioned from heparin to apixaban for AFib.  SCr has improved to 1.5 today. Age and weight are not qualifiers for reduced dosing. CBC stable, no bleeding noted.  Goal of Therapy:    Monitor platelets by anticoagulation protocol: Yes   Plan:  1. Continue apixaban 5mg  BID 2. Follow up results of TEE/CV being performed today 3. CBC q72h minimum 4. Follow up s/s bleeding  Maleena Eddleman D. Maddyx Wieck, PharmD, BCPS Clinical Pharmacist Pager: [phone] 09/25/2013 10:47 AM

## 2013-09-26 ENCOUNTER — Encounter (HOSPITAL_COMMUNITY): Payer: Self-pay | Admitting: Cardiovascular Disease

## 2013-09-26 ENCOUNTER — Inpatient Hospital Stay (HOSPITAL_COMMUNITY): Payer: Medicaid Other

## 2013-09-26 LAB — BASIC METABOLIC PANEL
BUN: 39 mg/dL — ABNORMAL HIGH (ref 6–23)
CALCIUM: 8.8 mg/dL (ref 8.4–10.5)
CO2: 44 mEq/L (ref 19–32)
Chloride: 93 mEq/L — ABNORMAL LOW (ref 96–112)
Creatinine, Ser: 1.48 mg/dL — ABNORMAL HIGH (ref 0.50–1.35)
GFR calc non Af Amer: 48 mL/min — ABNORMAL LOW (ref >90)
GFR, EST AFRICAN AMERICAN: 56 mL/min — AB (ref >90)
Glucose, Bld: 100 mg/dL — ABNORMAL HIGH (ref 70–99)
Potassium: 4 mEq/L (ref 3.7–5.3)
Sodium: 140 mEq/L (ref 137–147)

## 2013-09-26 LAB — CBC
HCT: 35.6 % — ABNORMAL LOW (ref 39.0–52.0)
Hemoglobin: 10.7 g/dL — ABNORMAL LOW (ref 13.0–17.0)
MCH: 27.2 pg (ref 26.0–34.0)
MCHC: 30.1 g/dL (ref 30.0–36.0)
MCV: 90.4 fL (ref 78.0–100.0)
PLATELETS: 179 10*3/uL (ref 150–400)
RBC: 3.94 MIL/uL — ABNORMAL LOW (ref 4.22–5.81)
RDW: 17.1 % — AB (ref 11.5–15.5)
WBC: 5.2 10*3/uL (ref 4.0–10.5)

## 2013-09-26 LAB — CULTURE, BLOOD (ROUTINE X 2)

## 2013-09-26 LAB — VITAMIN B12: VITAMIN B 12: 648 pg/mL (ref 211–911)

## 2013-09-26 MED ORDER — FUROSEMIDE 40 MG PO TABS
40.0000 mg | ORAL_TABLET | Freq: Every day | ORAL | Status: DC
  Administered 2013-09-26 – 2013-09-30 (×5): 40 mg via ORAL
  Filled 2013-09-26 (×5): qty 1

## 2013-09-26 NOTE — Progress Notes (Signed)
CRITICAL VALUE ALERT  Critical value received:  C02 44  Date of notification:  09/26/13  Time of notification:  0550  Critical value read back: yes  Nurse who received alert:  Alinda Deem  MD notified (1st page):  [phone]  Time of first page:    MD notified (2nd page):  Time of second page:  Responding MD:  Baltazar Najjar NP  Time MD responded:

## 2013-09-26 NOTE — Clinical Documentation Improvement (Signed)
Possible Clinical Conditions?  Acute Respiratory Failure Acute on Chronic Respiratory Failure Chronic Respiratory Failure Acute Respiratory Insufficiency Acute Respiratory Insufficiency following surgery or trauma Other Condition Cannot Clinically Determine   Supporting Information: Risk Factors:severe COPD, CHF, CAD, s/p AVR/CABG MAZE   Signs & Symptoms: progress note 09/24/13 :"on admit, he was found to be hypoxic on room air and put on 2 L nasal cannula. Lungs: Decrease breath sounds. On 2 liters [place]  Assessment:   Hypoxia--> O2 sats 83% on room air" ED MD  note:"Pulmonary/Chest: He is in respiratory distress (mild). . He has rales (bibasilar). Decreased breath sounds// states in the past several months he's been having gradual worsening of his shortness of breath"           RN note:"pt is using accessory muscles, small a,ount of exertion increases work of breathing. 87% on RA."  PT eval: 09/20/13:Currently pt desats without O2 and is low on RA at rest as well. May need home O2.        SATURATION QUALIFICATIONS: (This note is used to comply with regulatory documentation for home oxygen) Patient Saturations on Room Air at Rest = 90%  Patient Saturations on Room Air while Ambulating = 83-87%  Patient Saturations on 2 Liters of oxygen while Ambulating = 92%  Please briefly explain why patient needs home oxygen:Pt desats on RA with activity. O2 on 2L raises O2 to >90%. May need home O2    09/22/13 exercises with  on 4L O2   Diagnostics: Component     Latest Ref Rng 09/25/2013          O2 Content      2.0  Delivery systems      NASAL CANNULA  pH, Arterial     7.350 - 7.450 7.423  pCO2 arterial     35.0 - 45.0 mmHg 66.9 (HH)  pO2, Arterial     80.0 - 100.0 mmHg 59.7 (L)  Bicarbonate     20.0 - 24.0 mEq/L 42.9 (H)  Acid-Base Excess     0.0 - 2.0 mmol/L 17.3 (H)  O2 Saturation      92.0   Treatment:  Thank Glendora Score ,RN Clinical Documentation Specialist:   [place] [employer]

## 2013-09-26 NOTE — Progress Notes (Signed)
CRITICAL VALUE ALERT  Critical value received:  Co2 43  Date of notification:  09/25/13  Time of notification:  2045  Critical value read back:yes  Nurse who received alert:  Geraldo Docker  MD notified (1st page):  [place]  Time of first page:2045    MD notified (2nd page):  Time of second page:  Responding MD:  Alysia Penna  Time MD responded:  2050

## 2013-09-26 NOTE — Progress Notes (Signed)
Physical Therapy Treatment Patient Details Name: [name] MRN: [id number] DOB: March 09, 1951 Today's Date: 09/26/2013    History of Present Illness Pt admit with CHF and afib and now with fever of unknown origin.  PMH- CABG, COPD    PT Comments    Pt admitted with above. Pt currently with functional limitations due to endurance deficits.  Pt will benefit from skilled PT to increase their independence and safety with mobility to allow discharge to the venue listed below.   Follow Up Recommendations  Home health PT;Supervision - Intermittent     Equipment Recommendations  None recommended by PT    Recommendations for Other Services       Precautions / Restrictions Precautions Precautions: Fall Restrictions Weight Bearing Restrictions: No    Mobility  Bed Mobility                  Transfers Overall transfer level: Modified independent Equipment used: Rolling walker (2 wheeled)             General transfer comment: Pt able to transition to full standing position without assistance.   Ambulation/Gait Ambulation/Gait assistance: Supervision Ambulation Distance (Feet): 125 Feet Assistive device: Rolling walker (2 wheeled) Gait Pattern/deviations: Step-through pattern;Decreased stride length;Wide base of support   Gait velocity interpretation: Below normal speed for age/gender General Gait Details: Pt able to ambulate ~70 feet before O2 sats decreased from 94% to 81% on RA.  . Supplemental O2 was increased to 3L/min and pt was able to improve O2 sats with pursed-lip breathing to 90-94%%. No LOB with RW.  Some cues for safety with turns.    Stairs            Wheelchair Mobility    Modified Rankin (Stroke Patients Only)       Balance           Standing balance support: Bilateral upper extremity supported;During functional activity Standing balance-Leahy Scale: Fair Standing balance comment: can stand without UE support but cannot accept  challenges to balance without device.                     Cognition Arousal/Alertness: Awake/alert Behavior During Therapy: WFL for tasks assessed/performed Overall Cognitive Status: Within Functional Limits for tasks assessed                      Exercises General Exercises - Lower Extremity Ankle Circles/Pumps: AROM;Both;10 reps;Seated Long Arc Quad: AROM;Both;10 reps;Seated    General Comments General comments (skin integrity, edema, etc.): Back pain limits mobility.       Pertinent Vitals/Pain O2 sats on RA at rest 94%.  On RA with ambulation 81%.  Pt sats on 3LO2 at 94%.  Other VSS, no pain reported.     Home Living                      Prior Function            PT Goals (current goals can now be found in the care plan section) Progress towards PT goals: Progressing toward goals    Frequency  Min 3X/week    PT Plan Current plan remains appropriate    Co-evaluation             End of Session Equipment Utilized During Treatment: Gait belt;Oxygen Activity Tolerance: Patient limited by fatigue Patient left: in chair;with call bell/phone within reach;with family/visitor present     Time: 5643-3295 PT Time Calculation (  min): 19 min  Charges:  $Gait Training: 8-22 mins                    G Codes:      [name] [name] 10-10-13, 12:06 PM [name] [phone] (pager)

## 2013-09-26 NOTE — Progress Notes (Signed)
SATURATION QUALIFICATIONS: (This note is used to comply with regulatory documentation for home oxygen)  Patient Saturations on Room Air at Rest = 94%  Patient Saturations on Room Air while Ambulating = 81%  Patient Saturations on 3 Liters of oxygen while Ambulating = 90-94%  Please briefly explain why patient needs home oxygen:Pt desats with activity.  Thanks. [facility] [phone] (pager)

## 2013-09-26 NOTE — Progress Notes (Addendum)
Cards: Dr. Quay Burow   63 y.o. male with past medical history of severe aortic stenosis s/p tissue AV replacement, parox atrial fibrillation, heart failure, hypertension, and coronary artery disease who presents with shortness of breath that has progressed over the last few months now with severe DOE. + orthopnea in addition to abdominal distention and lower leg swelling.  Also atrial fib with RVR.  He reports intermittent weeping of wounds of lower extremity due to le edema. on admit, he was found to be hypoxic on room air and put on 2 L nasal cannula. CXR showing pulmonary edema. He was admitted for heart failure exacerbation. He was afebrile on admit however has developed high fevers of admit up to 102.3. ID and IM consulted. In addition, he had 2 sets of blood cx showing gpr +LISTERIA.    Subjective:  Feels better today, no CP, decreased SOB.  [place] O2. Getting a little frustrated with length of stay.   Nurse note says AFIB at 230am. Looked at tele, in NSR with PAC's.    Objective: Vital signs in last 24 hours: Temp:  [97.6 F (36.4 C)-98.3 F (36.8 C)] 97.6 F (36.4 C) (06/05 0438) Pulse Rate:  [63-109] 67 (06/05 0438) Resp:  [18-22] 18 (06/05 0438) BP: (113-145)/(43-69) 115/62 mmHg (06/05 0438) SpO2:  [91 %-99 %] 96 % (06/05 0438) Weight:  [220 lb 10.9 oz (100.1 kg)] 220 lb 10.9 oz (100.1 kg) (06/05 0438) Weight change: 14.1 oz (0.4 kg) Last BM Date: 09/25/13 Intake/Output from previous day: 06/04 0701 - 06/05 0700 In: 1103 [P.O.:600; I.V.:503] Out: 800 [Urine:800] Intake/Output this shift:    PE: General:Pleasant affect, NAD Skin:Warm and dry, brisk capillary refill HEENT:normocephalic, sclera clear, mucus membranes moist, dried blood around nares.  Heart:RRR without murmur, gallup, rub or click. Rare ectopy Lungs: poor air movement B, no active wheeze. Mild increased resp effort.  PJA:SNKNL, soft, non tender, + BS,  ZJQ:BHALPFXT lower ext edema, right leg dressing  in place. Marked improvement.  Neuro:alert and oriented, MAE, follows commands, + facial symmetry     Lab Results:  Recent Labs  09/25/13 0352 09/26/13 0358  WBC 5.0 5.2  HGB 11.2* 10.7*  HCT 36.6* 35.6*  PLT 172 179   BMET  Recent Labs  09/25/13 2045 09/26/13 0358  NA 139 140  K 3.9 4.0  CL 92* 93*  CO2 43* 44*  GLUCOSE 125* 100*  BUN 41* 39*  CREATININE 1.50* 1.48*  CALCIUM 8.5 8.8     Studies/Results:   Medications: I have reviewed the patient's current medications. Scheduled Meds: . amiodarone  200 mg Oral BID  . ampicillin (OMNIPEN) IV  2 g Intravenous 4 times per day  . apixaban  5 mg Oral BID  . atorvastatin  20 mg Oral q1800  . carvedilol  25 mg Oral BID WC  . folic acid  1 mg Oral Daily  . multivitamin with minerals  1 tablet Oral Daily  . potassium chloride SA  20 mEq Oral Daily  . sodium chloride  3 mL Intravenous Q12H  . thiamine  100 mg Oral Daily   Continuous Infusions:   PRN Meds:.sodium chloride, acetaminophen, haloperidol, haloperidol lactate, ipratropium-albuterol, sodium chloride  Assessment/Plan: 63 year old male with AS s/p AVR/CABG(SVG to PDA and PL) /MAZE(10/08/12), pAF on amiodarone, COPD who is admitted with volume overload, fever, now with Listeria blood CX.   -  Acute decompensated diastolic heart failure,improved with high dose lasix and metolazone. On admit creat was 1.4.   -  8 liters out - Wt 240 on admit, now 220. - Will restart lasix maintenance 40mg  QD.   Heart failure team consult noted. Appreciate their assistance as well. Reviewed note.   -  Atrial fibrillation/flutter with RVR-- currently NSR with PAC occasional Wenckebach. Prior HR 110 regular and upon careful inspection of tele appears to be 2:1 aflutter conduction. Successful cardioversion 09/25/13.  (last DCCV in Nov.2014 did not hold) -continue AMIO.   -give him one last shot at rhythm control since he has been on AMIO long term. If he fails again (high  likelihood with other comorbidities) continue with rate control. Stopped digoxin with worsening renal function.   -  Listeria +blood cultures -  ID team and IM, ABX AMP.  No evidence of endocarditis.   Reassuring. MRI reassuring. ?NEED for PICC line long term? Please order if needed. Appreciate TRH and ID help.   -  COPD (chronic obstructive pulmonary disease) - hypercarbia chronic. Quite significant.  Poor air movement. Dr. Eliseo Squires is giving him nebs while here. Has seen pulmonary in the past.    - PCo2 66, improved.    -  Anticoagulation on Eliquis (Restart) - had break in anticoagulation as outpatient.   -  Cellulitis, chronic lower ext-dsg in place  -  Debilitation, has never increased his energy to walk without walker, PT   - ETOH - no signs of delerium. Appreciate TRH.    LOS: 7 days     Candee Furbish, MD

## 2013-09-26 NOTE — Progress Notes (Addendum)
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 No evidence of thoracic spine infection.  2. Mild multifactorial degenerative spinal stenosis at T7-T8.  MR LUMBAR SPINE IMPRESSION  1. Abnormal bilateral erector spinae muscle signal, nonspecific and  can be commonly seen in debilitated patient's. No strong evidence of  infectious myositis at this time. No evidence of lumbar spinal  infection.  2. Lower lumbar spine degeneration without lumbar spinal stenosis.  3. Chronic abdominal aortic disease status post aortic bypass. -abx per ID -TEE ok - per IID- hope to be able to change to PO abx for discharge   Back pain -MRI- see above -could add lidocaine patch if continues  Alcohol abuse -CIWA- past symptoms for withdrawal  Atrial fibrillation with RVR  Currently rate controlled on amiodarone, carvedilol. His CHADS2 score is 2.   Acute decompensated heart failure  Lasix being held for now  On aspirin and digoxin as well. Has negative balance.   COPD (chronic obstructive pulmonary disease)  No signs of exacerbation. Continue O2 via nasal cannula and albuterol nebs  - add atrovent neb whole here- PFTs in 2014 showed severe emphysema- was given Breo with as needed albuterol- resume this at d/c -will get PA and lateral x ray  Acute resp failure-  Will need O2 at home  CAD  On aspirin, statin and carvedilol   Deconditioning  PT eval - home health?  Chronic Left leg cellulitis and peripheral vascular disease  No signs of acute infection and as per pt the leg appears much better now. Dressing as per wound care consult  Duplex negative for DVT  Acute kidney injury  Has good urine output. Continue to monitor   Code Status: full Family Communication: patient/daughter at bedside Disposition  Plan:    HPI/Subjective: Says breathing is at his baseline   Objective: Filed Vitals:   09/26/13 0438  BP: 115/62  Pulse: 67  Temp: 97.6 F (36.4 C)  Resp: 18    Intake/Output Summary (Last 24 hours) at 09/26/13 1139 Last data filed at 09/26/13 0900  Gross per 24 hour  Intake   1223 ml  Output    400 ml  Net    823 ml   Filed Weights   09/24/13 0525 09/25/13 0500 09/26/13 0438  Weight: 98.93 kg (218 lb 1.6 oz) 99.7 kg (219 lb 12.8 oz) 100.1 kg (220 lb 10.9 oz)    Exam:   General:  Pleasant/cooperative- A+Ox3, NAD  Cardiovascular: irr  Respiratory: decrease breath sounds- air moving better on left lung  Abdomen: +Bs, soft  Musculoskeletal: moves all 4 ext   Data Reviewed: Basic Metabolic Panel:  Recent Labs Lab 09/22/13 0450 09/24/13 0402 09/25/13 0352 09/25/13 2045 09/26/13 0358  NA 139 139 138 139 140  K 4.8 3.8 3.7 3.9 4.0  CL 96 88* 90* 92* 93*  CO2 33* 43* 42* 43* 44*  GLUCOSE 120* 109* 116* 125* 100*  BUN 26* 46* 44* 41* 39*  CREATININE 1.78* 2.02* 1.50* 1.50* 1.48*  CALCIUM 8.4 9.0 8.8 8.5 8.8   Liver Function Tests:  Recent Labs Lab 09/19/13 2009 09/25/13 2045  AST 50* 101*  ALT 46 87*  ALKPHOS 65 49  BILITOT 0.4 0.4  PROT 7.6 7.2  ALBUMIN 2.7* 2.6*   No results found for this basename: LIPASE, AMYLASE,  in the  last 168 hours  Recent Labs Lab 09/25/13 2045  AMMONIA 25   CBC:  Recent Labs Lab 09/22/13 0450 09/23/13 0349 09/24/13 0402 09/25/13 0352 09/26/13 0358  WBC 5.0 5.1 5.8 5.0 5.2  HGB 11.0* 11.0* 10.9* 11.2* 10.7*  HCT 36.6* 37.0* 35.7* 36.6* 35.6*  MCV 89.5 89.4 88.8 89.5 90.4  PLT 177 170 172 172 179   Cardiac Enzymes: No results found for this basename: CKTOTAL, CKMB, CKMBINDEX, TROPONINI,  in the last 168 hours BNP (last 3 results)  Recent Labs  10/01/12 0415 09/19/13 1941 09/24/13 0400  PROBNP 11193.0* 3530.0* 1792.0*   CBG: No results found for this basename: GLUCAP,  in the last 168  hours  Recent Results (from the past 240 hour(s))  CULTURE, BLOOD (ROUTINE X 2)     Status: None   Collection Time    09/21/13 10:11 AM      Result Value Ref Range Status   Specimen Description BLOOD RIGHT ARM   Final   Special Requests BOTTLES DRAWN AEROBIC AND ANAEROBIC 5CC   Final   Culture  Setup Time     Final   Value: 09/21/2013 18:11     Performed at [facility]   Culture     Final   Value: LISTERIA MONOCYTOGENES     Note: Recommended therapy: ampicillin with or without an aminoglycoside. Cephalosporins are not effective. CRITICAL RESULT CALLED TO, READ BACK BY AND VERIFIED WITH: OLIVIA STEIN 09/24/13 0800 BY SMITHERSJ FAXED TO [place] [facility] CONNIE WEANT [id number] BY      PEAKY     Note: Gram Stain Report Called to,Read Back By and Verified With: HEATHER B BY INGRAM A 09/22/13 1205PM     Performed at [facility]   Report Status 09/25/2013 FINAL   Final  CULTURE, BLOOD (ROUTINE X 2)     Status: None   Collection Time    09/21/13 10:16 AM      Result Value Ref Range Status   Specimen Description BLOOD RIGHT ARM   Final   Special Requests BOTTLES DRAWN AEROBIC AND ANAEROBIC 5CC   Final   Culture  Setup Time     Final   Value: 09/21/2013 18:11     Performed at [facility]   Culture     Final   Value: LISTERIA MONOCYTOGENES     Note: Recommended therapy: ampicillin with or without an aminoglycoside. Cephalosporins are not effective. CRITICAL RESULT CALLED TO, READ BACK BY AND VERIFIED WITH: OLIVIA STEIN 09/24/13 0800 BY SMITHERSJ FAXED TO [place] CONNIE WEANT [id number] BY      PEAKY     Note: Gram Stain Report Called to,Read Back By and Verified With: HEATHER B BY INGRAM A 09/22/13 1205PM     Performed at [facility]   Report Status 09/25/2013 FINAL   Final  URINE CULTURE     Status: None   Collection Time    09/21/13 10:24 AM      Result Value Ref Range Status   Specimen Description URINE, RANDOM   Final   Special Requests Normal   Final    Culture  Setup Time     Final   Value: 09/21/2013 18:41     Performed at [facility]     Final   Value: 25,000 COLONIES/ML     Performed at [facility]   Culture     Final   Value: Multiple bacterial morphotypes present,  none predominant. Suggest appropriate recollection if clinically indicated.     Performed at [facility]   Report Status 09/22/2013 FINAL   Final  CULTURE, BLOOD (ROUTINE X 2)     Status: None   Collection Time    09/22/13  1:56 PM      Result Value Ref Range Status   Specimen Description BLOOD LEFT HAND   Final   Special Requests     Final   Value: BOTTLES DRAWN AEROBIC AND ANAEROBIC 10CC BLUE  8CC RED   Culture  Setup Time     Final   Value: 09/22/2013 18:23     Performed at [facility]   Culture     Final   Value: LISTERIA MONOCYTOGENES     Note: Recommended therapy: ampicillin with or without an aminoglycoside. Cephalosporins are not effective.     Note: Gram Stain Report Called to,Read Back By and Verified With: HEATHER B@[id number]  ON [id number] BY [facility]     Performed at [facility]   Report Status 09/26/2013 FINAL   Final  CULTURE, BLOOD (ROUTINE X 2)     Status: None   Collection Time    09/22/13  2:02 PM      Result Value Ref Range Status   Specimen Description BLOOD RIGHT HAND   Final   Special Requests BOTTLES DRAWN AEROBIC ONLY 10CC   Final   Culture  Setup Time     Final   Value: 09/22/2013 18:23     Performed at [facility]   Culture     Final   Value: LISTERIA MONOCYTOGENES     Note: Recommended therapy: ampicillin with or without an aminoglycoside. Cephalosporins are not effective.     Note: Gram Stain Report Called to,Read Back By and Verified With: BREANA DAVIS ON 09/23/2013 AT 7:21P BY Dennard Nip     Performed at [facility]   Report Status 09/26/2013 FINAL   Final     Studies: Mr Thoracic Spine Wo Contrast  09/24/2013   CLINICAL DATA:  63 year old male with bacteremia. Query  spinal infection.  Renal insufficiency precludes use of IV contrast at this time.  EXAM: MRI THORACIC AND LUMBAR SPINE WITHOUT CONTRAST  TECHNIQUE: Multiplanar and multiecho pulse sequences of the thoracic and lumbar spine were obtained without intravenous contrast.  COMPARISON:  Portable chest radiograph 09/22/2013 and earlier. Lumbar radiographs 09/20/2013. CTA pelvis 09/23/2008.  FINDINGS: MR THORACIC SPINE FINDINGS  Limited sagittal imaging of the cervical spine is remarkable for widespread chronic disc and endplate degeneration.  Preserved thoracic vertebral height and alignment. No marrow edema or evidence of acute osseous abnormality. No abnormal prevertebral or paraspinal fluid.  Epidural lipomatosis maximal at the T5-T6 level partially effaces CSF from the thecal sac. Occasional small thoracic disc protrusions (T7-T8). Intermittent thoracic facet hypertrophy (moderate at T7-T8 -T10-T11). Borderline to mild thoracic spinal stenosis at T7-T8.  Spinal cord signal is within normal limits at all visualized levels. Conus medullaris demonstrated on the lumbar images.  Posterior paraspinal soft tissues are within normal limits. Grossly negative visualized thoracic viscera.  MR LUMBAR SPINE FINDINGS  Normal lumbar segmentation, congruent with the thoracic spine imaging. Stable lumbar vertebral height and alignment. Minimal inferior endplate marrow edema at T12 appears to be degenerative in nature. No acute osseous abnormality identified.  There is widespread mild increased STIR signal in the posterior paraspinal muscles (erector spinae muscles bilaterally). This is nonspecific. No paraspinal fluid collection.  Negative visualized abdominal viscera except for irregularity  the aorta and evidence of distal aortic bypass.  Visualized lower thoracic spinal cord is normal with conus medularis at L1. Normal cauda equina nerve roots. Normal lumbar epidural space.  Lumbar disc degeneration L3-L4 through L5-S1. No spinal  stenosis. Mild to moderate lower lumbar facet hypertrophy with trace facet joint fluid.  IMPRESSION: MR THORACIC SPINE IMPRESSION  1. No evidence of thoracic spine infection. 2. Mild multifactorial degenerative spinal stenosis at T7-T8.  MR LUMBAR SPINE IMPRESSION  1. Abnormal bilateral erector spinae muscle signal, nonspecific and can be commonly seen in debilitated patient's. No strong evidence of infectious myositis at this time. No evidence of lumbar spinal infection. 2. Lower lumbar spine degeneration without lumbar spinal stenosis. 3. Chronic abdominal aortic disease status post aortic bypass.   Electronically Signed   By: Lars Pinks M.D.   On: 09/24/2013 21:45   Mr Lumbar Spine Wo Contrast  09/24/2013   CLINICAL DATA:  63 year old male with bacteremia. Query spinal infection.  Renal insufficiency precludes use of IV contrast at this time.  EXAM: MRI THORACIC AND LUMBAR SPINE WITHOUT CONTRAST  TECHNIQUE: Multiplanar and multiecho pulse sequences of the thoracic and lumbar spine were obtained without intravenous contrast.  COMPARISON:  Portable chest radiograph 09/22/2013 and earlier. Lumbar radiographs 09/20/2013. CTA pelvis 09/23/2008.  FINDINGS: MR THORACIC SPINE FINDINGS  Limited sagittal imaging of the cervical spine is remarkable for widespread chronic disc and endplate degeneration.  Preserved thoracic vertebral height and alignment. No marrow edema or evidence of acute osseous abnormality. No abnormal prevertebral or paraspinal fluid.  Epidural lipomatosis maximal at the T5-T6 level partially effaces CSF from the thecal sac. Occasional small thoracic disc protrusions (T7-T8). Intermittent thoracic facet hypertrophy (moderate at T7-T8 -T10-T11). Borderline to mild thoracic spinal stenosis at T7-T8.  Spinal cord signal is within normal limits at all visualized levels. Conus medullaris demonstrated on the lumbar images.  Posterior paraspinal soft tissues are within normal limits. Grossly negative  visualized thoracic viscera.  MR LUMBAR SPINE FINDINGS  Normal lumbar segmentation, congruent with the thoracic spine imaging. Stable lumbar vertebral height and alignment. Minimal inferior endplate marrow edema at T12 appears to be degenerative in nature. No acute osseous abnormality identified.  There is widespread mild increased STIR signal in the posterior paraspinal muscles (erector spinae muscles bilaterally). This is nonspecific. No paraspinal fluid collection.  Negative visualized abdominal viscera except for irregularity the aorta and evidence of distal aortic bypass.  Visualized lower thoracic spinal cord is normal with conus medularis at L1. Normal cauda equina nerve roots. Normal lumbar epidural space.  Lumbar disc degeneration L3-L4 through L5-S1. No spinal stenosis. Mild to moderate lower lumbar facet hypertrophy with trace facet joint fluid.  IMPRESSION: MR THORACIC SPINE IMPRESSION  1. No evidence of thoracic spine infection. 2. Mild multifactorial degenerative spinal stenosis at T7-T8.  MR LUMBAR SPINE IMPRESSION  1. Abnormal bilateral erector spinae muscle signal, nonspecific and can be commonly seen in debilitated patient's. No strong evidence of infectious myositis at this time. No evidence of lumbar spinal infection. 2. Lower lumbar spine degeneration without lumbar spinal stenosis. 3. Chronic abdominal aortic disease status post aortic bypass.   Electronically Signed   By: Lars Pinks M.D.   On: 09/24/2013 21:45    Scheduled Meds: . amiodarone  200 mg Oral BID  . ampicillin (OMNIPEN) IV  2 g Intravenous 4 times per day  . apixaban  5 mg Oral BID  . atorvastatin  20 mg Oral q1800  . carvedilol  25 mg Oral  BID WC  . folic acid  1 mg Oral Daily  . furosemide  40 mg Oral Daily  . multivitamin with minerals  1 tablet Oral Daily  . potassium chloride SA  20 mEq Oral Daily  . sodium chloride  3 mL Intravenous Q12H  . thiamine  100 mg Oral Daily   Continuous Infusions:   Antibiotics  Given (last 72 hours)   Date/Time Action Medication Dose Rate   09/23/13 1257 Given   ceFEPIme (MAXIPIME) 2 g in dextrose 5 % 50 mL IVPB 2 g 100 mL/hr   09/23/13 1653 Given   ampicillin (OMNIPEN) 2 g in sodium chloride 0.9 % 50 mL IVPB 2 g 150 mL/hr   09/23/13 2309 Given   ampicillin (OMNIPEN) 2 g in sodium chloride 0.9 % 50 mL IVPB 2 g 150 mL/hr   09/24/13 5993 Given   ampicillin (OMNIPEN) 2 g in sodium chloride 0.9 % 50 mL IVPB 2 g 150 mL/hr   09/24/13 1101 Given   ampicillin (OMNIPEN) 2 g in sodium chloride 0.9 % 50 mL IVPB 2 g 150 mL/hr   09/24/13 1700 Given   ampicillin (OMNIPEN) 2 g in sodium chloride 0.9 % 50 mL IVPB 2 g 150 mL/hr   09/25/13 0052 Given   ampicillin (OMNIPEN) 2 g in sodium chloride 0.9 % 50 mL IVPB 2 g 150 mL/hr   09/25/13 0525 Given   ampicillin (OMNIPEN) 2 g in sodium chloride 0.9 % 50 mL IVPB 2 g 150 mL/hr   09/25/13 2215 Given   ampicillin (OMNIPEN) 2 g in sodium chloride 0.9 % 50 mL IVPB 2 g 150 mL/hr   09/26/13 0618 Given   ampicillin (OMNIPEN) 2 g in sodium chloride 0.9 % 50 mL IVPB 2 g 150 mL/hr      Principal Problem:   Acute decompensated heart failure Active Problems:   Atrial fibrillation with RVR   Fever of unknown origin (FUO)   COPD (chronic obstructive pulmonary disease)    Time spent: 35 min    [facility] Pager [phone]. If 7PM-7AM, please contact night-coverage at [email or web address], password [facility] 09/26/2013, 11:39 AM  LOS: 7 days

## 2013-09-26 NOTE — Progress Notes (Signed)
Rn notified by central telemetry, pt converted to atrial fib at 0230. Pt asymptomatic, lying in chair. B/P 115/62, Hrt rate 75,  96% 3L n/c. Will continue to monitor.    Nelly Scriven,MSN,RN

## 2013-09-26 NOTE — Progress Notes (Signed)
[facility] for Infectious Disease    Date of Admission:  09/19/2013   Total days of antibiotics 5        Day 4 ampicillin           ID: [name] is a 63 y.o. 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 TEE negative for vegetations, and mri of spine is negative for osteo/or epidural abscess  Spoke with micro lab: all 4 sets of blood cx c/w listeria  Medications:  . amiodarone  200 mg Oral BID  . ampicillin (OMNIPEN) IV  2 g Intravenous 4 times per day  . apixaban  5 mg Oral BID  . atorvastatin  20 mg Oral q1800  . carvedilol  25 mg Oral BID WC  . folic acid  1 mg Oral Daily  . furosemide  40 mg Oral Daily  . multivitamin with minerals  1 tablet Oral Daily  . potassium chloride SA  20 mEq Oral Daily  . sodium chloride  3 mL Intravenous Q12H  . thiamine  100 mg Oral Daily    Objective: Vital signs in last 24 hours: Temp:  [97.6 F (36.4 C)-98.3 F (36.8 C)] 97.6 F (36.4 C) (06/05 0438) Pulse Rate:  [63-105] 67 (06/05 0438) Resp:  [18-22] 18 (06/05 0438) BP: (113-142)/(43-69) 115/62 mmHg (06/05 0438) SpO2:  [91 %-99 %] 96 % (06/05 0438) Weight:  [220 lb 10.9 oz (100.1 kg)] 220 lb 10.9 oz (100.1 kg) (06/05 0438)  General: Elderly obese male sitting up in chair in no acute distress.  HEENT: No pallor, no icterus, no oral thrush, no cervical lymphadenopathy  Heart: S1 and S2 irregularly [id number],3/1 systolic murmur  Lungs: Diminished breath sounds in bilateral lung bases, no crackles, rhonchi or wheeze  Abdomen: Soft, nontender, nondistended, positive bowel sounds.  Extremities: +2 edema bilaterally LE  Skin: Cellulitis to distal pretibial area of left leg, and posterior right  leg, weeping over left leg, no obvious bruising and discharged. No back tenderness. No ankle or knee tenderness or swelling, right calf tenderness to pressure.  Neuro: AAOX3, CN 2-12 intact    Lab Results  Recent Labs  09/25/13 0352 09/25/13 2045 09/26/13 0358  WBC 5.0  --  5.2  HGB 11.2*  --  10.7*  HCT 36.6*  --  35.6*  NA 138 139 140  K 3.7 3.9 4.0  CL 90* 92* 93*  CO2 42* 43* 44*  BUN 44* 41* 39*  CREATININE 1.50* 1.50* 1.48*   Sedimentation Rate No results found for this basename: ESRSEDRATE,  in the last 72 hours C-Reactive Protein No results found for this basename: CRP,  in the last 72 hours  Microbiology: 5/31 blood cx 2/2 listeria 6/1 blood cx 1/ 2 listeria  Studies/Results: Mr Thoracic Spine Wo Contrast  09/24/2013   CLINICAL DATA:  63 year old male with bacteremia. Query spinal infection.  Renal insufficiency precludes use of IV contrast at this time.  EXAM: MRI THORACIC AND LUMBAR SPINE WITHOUT CONTRAST  TECHNIQUE: Multiplanar and multiecho pulse sequences of the thoracic and lumbar spine were obtained without intravenous contrast.  COMPARISON:  Portable chest radiograph 09/22/2013 and earlier. Lumbar radiographs 09/20/2013. CTA pelvis 09/23/2008.  FINDINGS: MR THORACIC SPINE FINDINGS  Limited sagittal  imaging of the cervical spine is remarkable for widespread chronic disc and endplate degeneration.  Preserved thoracic vertebral height and alignment. No marrow edema or evidence of acute osseous abnormality. No abnormal prevertebral or paraspinal fluid.  Epidural lipomatosis maximal at the T5-T6 level partially effaces CSF from the thecal sac. Occasional small thoracic disc protrusions (T7-T8). Intermittent thoracic facet hypertrophy (moderate at T7-T8 -T10-T11). Borderline to mild thoracic spinal stenosis at T7-T8.  Spinal cord signal is within normal limits at all visualized levels. Conus medullaris demonstrated on the lumbar images.  Posterior paraspinal soft tissues  are within normal limits. Grossly negative visualized thoracic viscera.  MR LUMBAR SPINE FINDINGS  Normal lumbar segmentation, congruent with the thoracic spine imaging. Stable lumbar vertebral height and alignment. Minimal inferior endplate marrow edema at T12 appears to be degenerative in nature. No acute osseous abnormality identified.  There is widespread mild increased STIR signal in the posterior paraspinal muscles (erector spinae muscles bilaterally). This is nonspecific. No paraspinal fluid collection.  Negative visualized abdominal viscera except for irregularity the aorta and evidence of distal aortic bypass.  Visualized lower thoracic spinal cord is normal with conus medularis at L1. Normal cauda equina nerve roots. Normal lumbar epidural space.  Lumbar disc degeneration L3-L4 through L5-S1. No spinal stenosis. Mild to moderate lower lumbar facet hypertrophy with trace facet joint fluid.  IMPRESSION: MR THORACIC SPINE IMPRESSION  1. No evidence of thoracic spine infection. 2. Mild multifactorial degenerative spinal stenosis at T7-T8.  MR LUMBAR SPINE IMPRESSION  1. Abnormal bilateral erector spinae muscle signal, nonspecific and can be commonly seen in debilitated patient's. No strong evidence of infectious myositis at this time. No evidence of lumbar spinal infection. 2. Lower lumbar spine degeneration without lumbar spinal stenosis. 3. Chronic abdominal aortic disease status post aortic bypass.   Electronically Signed   By: Lars Pinks M.D.   On: 09/24/2013 21:45   Mr Lumbar Spine Wo Contrast  09/24/2013   CLINICAL DATA:  63 year old male with bacteremia. Query spinal infection.  Renal insufficiency precludes use of IV contrast at this time.  EXAM: MRI THORACIC AND LUMBAR SPINE WITHOUT CONTRAST  TECHNIQUE: Multiplanar and multiecho pulse sequences of the thoracic and lumbar spine were obtained without intravenous contrast.  COMPARISON:  Portable chest radiograph 09/22/2013 and earlier. Lumbar  radiographs 09/20/2013. CTA pelvis 09/23/2008.  FINDINGS: MR THORACIC SPINE FINDINGS  Limited sagittal imaging of the cervical spine is remarkable for widespread chronic disc and endplate degeneration.  Preserved thoracic vertebral height and alignment. No marrow edema or evidence of acute osseous abnormality. No abnormal prevertebral or paraspinal fluid.  Epidural lipomatosis maximal at the T5-T6 level partially effaces CSF from the thecal sac. Occasional small thoracic disc protrusions (T7-T8). Intermittent thoracic facet hypertrophy (moderate at T7-T8 -T10-T11). Borderline to mild thoracic spinal stenosis at T7-T8.  Spinal cord signal is within normal limits at all visualized levels. Conus medullaris demonstrated on the lumbar images.  Posterior paraspinal soft tissues are within normal limits. Grossly negative visualized thoracic viscera.  MR LUMBAR SPINE FINDINGS  Normal lumbar segmentation, congruent with the thoracic spine imaging. Stable lumbar vertebral height and alignment. Minimal inferior endplate marrow edema at T12 appears to be degenerative in nature. No acute osseous abnormality identified.  There is widespread mild increased STIR signal in the posterior paraspinal muscles (erector spinae muscles bilaterally). This is nonspecific. No paraspinal fluid collection.  Negative visualized abdominal viscera except for irregularity the aorta and evidence of distal aortic bypass.  Visualized lower thoracic spinal cord is normal  with conus medularis at L1. Normal cauda equina nerve roots. Normal lumbar epidural space.  Lumbar disc degeneration L3-L4 through L5-S1. No spinal stenosis. Mild to moderate lower lumbar facet hypertrophy with trace facet joint fluid.  IMPRESSION: MR THORACIC SPINE IMPRESSION  1. No evidence of thoracic spine infection. 2. Mild multifactorial degenerative spinal stenosis at T7-T8.  MR LUMBAR SPINE IMPRESSION  1. Abnormal bilateral erector spinae muscle signal, nonspecific and can be  commonly seen in debilitated patient's. No strong evidence of infectious myositis at this time. No evidence of lumbar spinal infection. 2. Lower lumbar spine degeneration without lumbar spinal stenosis. 3. Chronic abdominal aortic disease status post aortic bypass.   Electronically Signed   By: Lars Pinks M.D.   On: 09/24/2013 21:45   Assessment/Plan: Listeria bacteremia = continue on ampicillin 2gm IV Q6hr. Since TEE and MRI is negative, will treat for 2 wk, using 6/3 as day 1 of 14 of antibiotics. For discharge can transition him to oral bactrim since he will likely have difficulty with picc line but most importantly we have eliminated disseminated disease. Can switch to bactrim DS 2 tabs Q 8hr.  Recommend to check bmp twice a week x 1 wk to ensure cr is not significantly elevated.  He has no pcp, may need referral to community health center or the internal medicine or family practice residents clinic  Will sign off  [facility] Cell: [phone] Pager: [phone]  09/26/2013, 11:25 AM

## 2013-09-27 DIAGNOSIS — A327 Listerial sepsis: Secondary | ICD-10-CM | POA: Diagnosis present

## 2013-09-27 LAB — HEPATIC FUNCTION PANEL
ALK PHOS: 48 U/L (ref 39–117)
ALT: 85 U/L — AB (ref 0–53)
AST: 89 U/L — AB (ref 0–37)
Albumin: 2.6 g/dL — ABNORMAL LOW (ref 3.5–5.2)
BILIRUBIN TOTAL: 0.4 mg/dL (ref 0.3–1.2)
Total Protein: 7.1 g/dL (ref 6.0–8.3)

## 2013-09-27 LAB — CBC
HCT: 35.2 % — ABNORMAL LOW (ref 39.0–52.0)
HEMOGLOBIN: 10.5 g/dL — AB (ref 13.0–17.0)
MCH: 26.6 pg (ref 26.0–34.0)
MCHC: 29.8 g/dL — ABNORMAL LOW (ref 30.0–36.0)
MCV: 89.3 fL (ref 78.0–100.0)
Platelets: 191 10*3/uL (ref 150–400)
RBC: 3.94 MIL/uL — ABNORMAL LOW (ref 4.22–5.81)
RDW: 17 % — AB (ref 11.5–15.5)
WBC: 5.9 10*3/uL (ref 4.0–10.5)

## 2013-09-27 LAB — BASIC METABOLIC PANEL
BUN: 39 mg/dL — AB (ref 6–23)
CHLORIDE: 95 meq/L — AB (ref 96–112)
CO2: 39 meq/L — AB (ref 19–32)
Calcium: 8.5 mg/dL (ref 8.4–10.5)
Creatinine, Ser: 1.35 mg/dL (ref 0.50–1.35)
GFR calc non Af Amer: 54 mL/min — ABNORMAL LOW (ref >90)
GFR, EST AFRICAN AMERICAN: 63 mL/min — AB (ref >90)
GLUCOSE: 106 mg/dL — AB (ref 70–99)
Potassium: 3.7 mEq/L (ref 3.7–5.3)
Sodium: 140 mEq/L (ref 137–147)

## 2013-09-27 MED ORDER — PREDNISONE 20 MG PO TABS
40.0000 mg | ORAL_TABLET | Freq: Every day | ORAL | Status: DC
  Administered 2013-09-27 – 2013-09-30 (×4): 40 mg via ORAL
  Filled 2013-09-27 (×6): qty 2

## 2013-09-27 NOTE — Progress Notes (Signed)
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 No evidence of thoracic spine infection.  2. Mild multifactorial degenerative spinal stenosis at T7-T8.  MR LUMBAR SPINE IMPRESSION  1. Abnormal bilateral erector spinae muscle signal, nonspecific and  can be commonly seen in debilitated patient's. No strong evidence of  infectious myositis at this time. No evidence of lumbar spinal  infection.  2. Lower lumbar spine degeneration without lumbar spinal stenosis.  3. Chronic abdominal aortic disease status post aortic bypass. -abx per ID -TEE ok - per ID- continue on ampicillin 2gm IV Q6hr. Since TEE and MRI is negative, will treat for 2 wk, using 6/3 as day 1 of 14 of antibiotics. For discharge can transition him to oral bactrim since he will likely have difficulty with picc line but most importantly we have eliminated disseminated disease. Can switch to bactrim DS 2 tabs Q 8hr. Recommend to check bmp twice a week x 1 wk to ensure cr is not significantly elevated.   Back pain -MRI- see above- pain in better Neurosurgery referral for spinal stenosis -could add lidocaine patch if continues  Alcohol abuse -CIWA- past symptoms for withdrawal  Atrial fibrillation with RVR  Currently rate controlled on amiodarone, carvedilol. His CHADS2 score is 2.   Acute decompensated heart failure  Lasix  On aspirin and digoxin as well. Has negative balance.   COPD (chronic obstructive pulmonary disease)  No signs of exacerbation. Continue O2 via nasal cannula and albuterol nebs  - add atrovent neb whole here- PFTs in 2014 showed severe emphysema- was given Breo with as needed albuterol- resume this at d/c - PA and lateral x ray- show some fluid still, no PNA  Acute resp failure-  Will need O2 at home  CAD  On  aspirin, statin and carvedilol   Deconditioning  PT eval - home health?  Chronic Left leg cellulitis and peripheral vascular disease  No signs of acute infection and as per pt the leg appears much better now. Dressing as per wound care consult  Duplex negative for DVT  Acute kidney injury  Has good urine output. Continue to monitor   Code Status: full Family Communication: patient/daughter on phone yest Disposition Plan: spoke at length with patient about quitting drinking   HPI/Subjective: Says breathing is at his baseline Slept ok, eating well   Objective: Filed Vitals:   09/27/13 0356  BP: 110/67  Pulse: 103  Temp: 97.9 F (36.6 C)  Resp: 18    Intake/Output Summary (Last 24 hours) at 09/27/13 1131 Last data filed at 09/27/13 1008  Gross per 24 hour  Intake    840 ml  Output   1575 ml  Net   -735 ml   Filed Weights   09/25/13 0500 09/26/13 0438 09/27/13 0356  Weight: 99.7 kg (219 lb 12.8 oz) 100.1 kg (220 lb 10.9 oz) 100.608 kg (221 lb 12.8 oz)    Exam:   General:  Pleasant/cooperative- A+Ox3, NAD  Cardiovascular: irr  Respiratory: decrease breath sounds- air moving better on left lung- b/l expiratory wheezing  Abdomen: +Bs, soft  Musculoskeletal: moves all 4 ext   Data Reviewed: Basic Metabolic Panel:  Recent Labs Lab 09/24/13 0402 09/25/13 0352 09/25/13 2045 09/26/13 0358 09/27/13 0345  NA 139 138 139 140 140  K 3.8 3.7 3.9 4.0 3.7  CL 88*  90* 92* 93* 95*  CO2 43* 42* 43* 44* 39*  GLUCOSE 109* 116* 125* 100* 106*  BUN 46* 44* 41* 39* 39*  CREATININE 2.02* 1.50* 1.50* 1.48* 1.35  CALCIUM 9.0 8.8 8.5 8.8 8.5   Liver Function Tests:  Recent Labs Lab 09/25/13 2045  AST 101*  ALT 87*  ALKPHOS 49  BILITOT 0.4  PROT 7.2  ALBUMIN 2.6*   No results found for this basename: LIPASE, AMYLASE,  in the last 168 hours  Recent Labs Lab 09/25/13 2045  AMMONIA 25   CBC:  Recent Labs Lab 09/23/13 0349 09/24/13 0402 09/25/13 0352  09/26/13 0358 09/27/13 0345  WBC 5.1 5.8 5.0 5.2 5.9  HGB 11.0* 10.9* 11.2* 10.7* 10.5*  HCT 37.0* 35.7* 36.6* 35.6* 35.2*  MCV 89.4 88.8 89.5 90.4 89.3  PLT 170 172 172 179 191   Cardiac Enzymes: No results found for this basename: CKTOTAL, CKMB, CKMBINDEX, TROPONINI,  in the last 168 hours BNP (last 3 results)  Recent Labs  10/01/12 0415 09/19/13 1941 09/24/13 0400  PROBNP 11193.0* 3530.0* 1792.0*   CBG: No results found for this basename: GLUCAP,  in the last 168 hours  Recent Results (from the past 240 hour(s))  CULTURE, BLOOD (ROUTINE X 2)     Status: None   Collection Time    09/21/13 10:11 AM      Result Value Ref Range Status   Specimen Description BLOOD RIGHT ARM   Final   Special Requests BOTTLES DRAWN AEROBIC AND ANAEROBIC 5CC   Final   Culture  Setup Time     Final   Value: 09/21/2013 18:11     Performed at [facility]   Culture     Final   Value: LISTERIA MONOCYTOGENES     Note: Recommended therapy: ampicillin with or without an aminoglycoside. Cephalosporins are not effective. CRITICAL RESULT CALLED TO, READ BACK BY AND VERIFIED WITH: OLIVIA STEIN 09/24/13 0800 BY SMITHERSJ FAXED TO [place] [facility] CONNIE WEANT [id number] BY      PEAKY     Note: Gram Stain Report Called to,Read Back By and Verified With: HEATHER B BY INGRAM A 09/22/13 1205PM     Performed at [facility]   Report Status 09/25/2013 FINAL   Final  CULTURE, BLOOD (ROUTINE X 2)     Status: None   Collection Time    09/21/13 10:16 AM      Result Value Ref Range Status   Specimen Description BLOOD RIGHT ARM   Final   Special Requests BOTTLES DRAWN AEROBIC AND ANAEROBIC 5CC   Final   Culture  Setup Time     Final   Value: 09/21/2013 18:11     Performed at [facility]   Culture     Final   Value: LISTERIA MONOCYTOGENES     Note: Recommended therapy: ampicillin with or without an aminoglycoside. Cephalosporins are not effective. CRITICAL RESULT CALLED TO, READ BACK BY AND  VERIFIED WITH: OLIVIA STEIN 09/24/13 0800 BY SMITHERSJ FAXED TO [facility] WEANT [id number] BY      PEAKY     Note: Gram Stain Report Called to,Read Back By and Verified With: HEATHER B BY INGRAM A 09/22/13 1205PM     Performed at [facility]   Report Status 09/25/2013 FINAL   Final  URINE CULTURE     Status: None   Collection Time    09/21/13 10:24 AM      Result Value Ref Range  Status   Specimen Description URINE, RANDOM   Final   Special Requests Normal   Final   Culture  Setup Time     Final   Value: 09/21/2013 18:41     Performed at [place]     Final   Value: 25,000 COLONIES/ML     Performed at [facility]   Culture     Final   Value: Multiple bacterial morphotypes present, none predominant. Suggest appropriate recollection if clinically indicated.     Performed at [facility]   Report Status 09/22/2013 FINAL   Final  CULTURE, BLOOD (ROUTINE X 2)     Status: None   Collection Time    09/22/13  1:56 PM      Result Value Ref Range Status   Specimen Description BLOOD LEFT HAND   Final   Special Requests     Final   Value: BOTTLES DRAWN AEROBIC AND ANAEROBIC 10CC BLUE  8CC RED   Culture  Setup Time     Final   Value: 09/22/2013 18:23     Performed at [facility]   Culture     Final   Value: LISTERIA MONOCYTOGENES     Note: Recommended therapy: ampicillin with or without an aminoglycoside. Cephalosporins are not effective.     Note: Gram Stain Report Called to,Read Back By and Verified With: HEATHER B@[id number]  ON [id number] BY [facility]     Performed at [facility]   Report Status 09/26/2013 FINAL   Final  CULTURE, BLOOD (ROUTINE X 2)     Status: None   Collection Time    09/22/13  2:02 PM      Result Value Ref Range Status   Specimen Description BLOOD RIGHT HAND   Final   Special Requests BOTTLES DRAWN AEROBIC ONLY 10CC   Final   Culture  Setup Time     Final   Value: 09/22/2013 18:23     Performed at  [facility]   Culture     Final   Value: LISTERIA MONOCYTOGENES     Note: Recommended therapy: ampicillin with or without an aminoglycoside. Cephalosporins are not effective.     Note: Gram Stain Report Called to,Read Back By and Verified With: BREANA DAVIS ON 09/23/2013 AT 7:21P BY Dennard Nip     Performed at [facility]   Report Status 09/26/2013 FINAL   Final     Studies: Dg Chest 2 View  09/26/2013   CLINICAL DATA:  Cough, fever, and shortness of breath.  Hypoxia.  EXAM: CHEST  2 VIEW  COMPARISON:  09/2013  FINDINGS: Sequelae of prior CABG, left atrial clipping, and aortic valve replacement are again identified. Cardiac silhouette remains mildly enlarged. Thoracic aortic calcification is noted. Pulmonary vascular congestion on the prior study has improved. Peripheral linear densities in the left mid to lower lung suggestive of Kerley B-lines are stable to slightly improved. Mildly increased interstitial densities in the right mid to lower lung are stable to slightly improved. More prominent linear opacity in the basilar left lower lobe may reflect subsegmental atelectasis. There is no evidence of new, confluent airspace opacity, pleural effusion, or pneumothorax. No acute osseous abnormality is identified.  IMPRESSION: Overall mildly improved appearance of the chest with scattered, residual interstitial opacities most suggestive of mild edema. No confluent airspace opacity.   Electronically Signed   By: Logan Bores   On: 09/26/2013 12:54    Scheduled Meds: . amiodarone  200 mg Oral BID  . ampicillin (OMNIPEN) IV  2 g Intravenous 4 times per day  . apixaban  5 mg Oral BID  . atorvastatin  20 mg Oral q1800  . carvedilol  25 mg Oral BID WC  . folic acid  1 mg Oral Daily  . furosemide  40 mg Oral Daily  . multivitamin with minerals  1 tablet Oral Daily  . potassium chloride SA  20 mEq Oral Daily  . predniSONE  40 mg Oral Q breakfast  . sodium chloride  3 mL Intravenous Q12H  .  thiamine  100 mg Oral Daily   Continuous Infusions:   Antibiotics Given (last 72 hours)   Date/Time Action Medication Dose Rate   09/24/13 1700 Given   ampicillin (OMNIPEN) 2 g in sodium chloride 0.9 % 50 mL IVPB 2 g 150 mL/hr   09/25/13 0052 Given   ampicillin (OMNIPEN) 2 g in sodium chloride 0.9 % 50 mL IVPB 2 g 150 mL/hr   09/25/13 0525 Given   ampicillin (OMNIPEN) 2 g in sodium chloride 0.9 % 50 mL IVPB 2 g 150 mL/hr   09/25/13 2215 Given   ampicillin (OMNIPEN) 2 g in sodium chloride 0.9 % 50 mL IVPB 2 g 150 mL/hr   09/26/13 0618 Given   ampicillin (OMNIPEN) 2 g in sodium chloride 0.9 % 50 mL IVPB 2 g 150 mL/hr   09/26/13 1222 Given   ampicillin (OMNIPEN) 2 g in sodium chloride 0.9 % 50 mL IVPB 2 g 150 mL/hr   09/26/13 1850 Given   ampicillin (OMNIPEN) 2 g in sodium chloride 0.9 % 50 mL IVPB 2 g 150 mL/hr   09/27/13 0015 Given   ampicillin (OMNIPEN) 2 g in sodium chloride 0.9 % 50 mL IVPB 2 g 150 mL/hr   09/27/13 0655 Given   ampicillin (OMNIPEN) 2 g in sodium chloride 0.9 % 50 mL IVPB 2 g 150 mL/hr      Principal Problem:   Acute decompensated heart failure Active Problems:   Atrial fibrillation with RVR   Fever of unknown origin (FUO)   COPD (chronic obstructive pulmonary disease)    Time spent: 35 min    [facility] Pager [phone]. If 7PM-7AM, please contact night-coverage at [email or web address], password [facility] 09/27/2013, 11:31 AM  LOS: 8 days

## 2013-09-27 NOTE — Progress Notes (Signed)
[place] for apixaban Indication: atrial fibrillation  No Known Allergies  Patient Measurements: Height: 5\' 10"  (177.8 cm) Weight: 221 lb 12.8 oz (100.608 kg) IBW/kg (Calculated) : 73  Vital Signs: Temp: 97.9 F (36.6 C) (06/06 0356) Temp src: Oral (06/06 0356) BP: 110/67 mmHg (06/06 0356) Pulse Rate: 103 (06/06 0356)  Labs:  Recent Labs  09/25/13 0352 09/25/13 2045 09/26/13 0358 09/27/13 0345  HGB 11.2*  --  10.7* 10.5*  HCT 36.6*  --  35.6* 35.2*  PLT 172  --  179 191  CREATININE 1.50* 1.50* 1.48* 1.35    Estimated Creatinine Clearance: 65.7 ml/min (by C-G formula based on Cr of 1.35).   Assessment: 63 YOM with hx of AFib, aortic stenosis s/p tissue AV replacement and CHF, transitioned from heparin to apixaban for AFib.  SCr has improved to 1.35 today. Age and weight are not qualifiers for reduced dosing. CBC stable, no bleeding noted. Cardioversion performed 6/4, continues on amiodarone as well.  Goal of Therapy:    Monitor platelets by anticoagulation protocol: Yes   Plan:  1. Continue apixaban 5mg  BID 2. CBC q72h minimum 3. Pharmacy to sign off as no dose adjustments will be needed. Please reconsult if needed.  Daschel Roughton D. Geniene List, PharmD, BCPS Clinical Pharmacist Pager: [phone] 09/27/2013 1:57 PM

## 2013-09-27 NOTE — Progress Notes (Signed)
SUBJECTIVE:  Mild SOB  OBJECTIVE:   Vitals:   Filed Vitals:   09/26/13 0438 09/26/13 1355 09/26/13 1946 09/27/13 0356  BP: 115/62 109/62 123/64 110/67  Pulse: 67 69 79 103  Temp: 97.6 F (36.4 C) 97.4 F (36.3 C) 98.1 F (36.7 C) 97.9 F (36.6 C)  TempSrc: Oral Oral Oral Oral  Resp: 18 18 20 18   Height:      Weight: 220 lb 10.9 oz (100.1 kg)   221 lb 12.8 oz (100.608 kg)  SpO2: 96% 95% 95% 96%   I&O's:   Intake/Output Summary (Last 24 hours) at 09/27/13 0910 Last data filed at 09/27/13 0357  Gross per 24 hour  Intake    600 ml  Output   1575 ml  Net   -975 ml   TELEMETRY: Reviewed telemetry pt in atrial fibrillation:     PHYSICAL EXAM General: Well developed, well nourished, in no acute distress Head: Eyes PERRLA, No xanthomas.   Normal cephalic and atramatic  Lungs:   Decreased air movement throughout and diffuse wheezing Heart:   HRRR S1 S2 Pulses are 2+ & equal. Abdomen: Bowel sounds are positive, abdomen soft and non-tender without masses Extremities:   No clubbing, cyanosis or edema.  DP +1 Neuro: Alert and oriented X 3. Psych:  Good affect, responds appropriately   LABS: Basic Metabolic Panel:  Recent Labs  09/26/13 0358 09/27/13 0345  NA 140 140  K 4.0 3.7  CL 93* 95*  CO2 44* 39*  GLUCOSE 100* 106*  BUN 39* 39*  CREATININE 1.48* 1.35  CALCIUM 8.8 8.5   Liver Function Tests:  Recent Labs  09/25/13 2045  AST 101*  ALT 87*  ALKPHOS 49  BILITOT 0.4  PROT 7.2  ALBUMIN 2.6*   No results found for this basename: LIPASE, AMYLASE,  in the last 72 hours CBC:  Recent Labs  09/26/13 0358 09/27/13 0345  WBC 5.2 5.9  HGB 10.7* 10.5*  HCT 35.6* 35.2*  MCV 90.4 89.3  PLT 179 191   Anemia Panel:  Recent Labs  09/26/13 0358  VITAMINB12 648   Coag Panel:   Lab Results  Component Value Date   INR 1.04 09/19/2013   INR 1.33 02/25/2013   INR 1.82* 01/14/2013    RADIOLOGY: Dg Chest 2 View  09/26/2013   CLINICAL DATA:  Cough, fever,  and shortness of breath.  Hypoxia.  EXAM: CHEST  2 VIEW  COMPARISON:  09/2013  FINDINGS: Sequelae of prior CABG, left atrial clipping, and aortic valve replacement are again identified. Cardiac silhouette remains mildly enlarged. Thoracic aortic calcification is noted. Pulmonary vascular congestion on the prior study has improved. Peripheral linear densities in the left mid to lower lung suggestive of Kerley B-lines are stable to slightly improved. Mildly increased interstitial densities in the right mid to lower lung are stable to slightly improved. More prominent linear opacity in the basilar left lower lobe may reflect subsegmental atelectasis. There is no evidence of new, confluent airspace opacity, pleural effusion, or pneumothorax. No acute osseous abnormality is identified.  IMPRESSION: Overall mildly improved appearance of the chest with scattered, residual interstitial opacities most suggestive of mild edema. No confluent airspace opacity.   Electronically Signed   By: Logan Bores   On: 09/26/2013 12:54   Dg Lumbar Spine 2-3 Views  09/20/2013   CLINICAL DATA:  Lower lumbar pain  EXAM: LUMBAR SPINE - 2-3 VIEW  COMPARISON:  None.  FINDINGS: There are 5 nonrib bearing lumbar-type vertebral bodies.  The vertebral body heights are maintained. The alignment is anatomic. There is no spondylolysis. There is no acute fracture or static listhesis. Degenerative disc disease most significant at L3-4, L4-5 and L5-S1.  The SI joints are unremarkable.  Abdominal aortic atherosclerosis.  Left iliac stent noted.  IMPRESSION: Lumbar spine spondylosis at L3-4, L4-5 and L5-S1. No acute osseous injury of the lumbar spine.   Electronically Signed   By: Kathreen Devoid   On: 09/20/2013 17:20   Mr Thoracic Spine Wo Contrast  09/24/2013   CLINICAL DATA:  63 year old male with bacteremia. Query spinal infection.  Renal insufficiency precludes use of IV contrast at this time.  EXAM: MRI THORACIC AND LUMBAR SPINE WITHOUT CONTRAST   TECHNIQUE: Multiplanar and multiecho pulse sequences of the thoracic and lumbar spine were obtained without intravenous contrast.  COMPARISON:  Portable chest radiograph 09/22/2013 and earlier. Lumbar radiographs 09/20/2013. CTA pelvis 09/23/2008.  FINDINGS: MR THORACIC SPINE FINDINGS  Limited sagittal imaging of the cervical spine is remarkable for widespread chronic disc and endplate degeneration.  Preserved thoracic vertebral height and alignment. No marrow edema or evidence of acute osseous abnormality. No abnormal prevertebral or paraspinal fluid.  Epidural lipomatosis maximal at the T5-T6 level partially effaces CSF from the thecal sac. Occasional small thoracic disc protrusions (T7-T8). Intermittent thoracic facet hypertrophy (moderate at T7-T8 -T10-T11). Borderline to mild thoracic spinal stenosis at T7-T8.  Spinal cord signal is within normal limits at all visualized levels. Conus medullaris demonstrated on the lumbar images.  Posterior paraspinal soft tissues are within normal limits. Grossly negative visualized thoracic viscera.  MR LUMBAR SPINE FINDINGS  Normal lumbar segmentation, congruent with the thoracic spine imaging. Stable lumbar vertebral height and alignment. Minimal inferior endplate marrow edema at T12 appears to be degenerative in nature. No acute osseous abnormality identified.  There is widespread mild increased STIR signal in the posterior paraspinal muscles (erector spinae muscles bilaterally). This is nonspecific. No paraspinal fluid collection.  Negative visualized abdominal viscera except for irregularity the aorta and evidence of distal aortic bypass.  Visualized lower thoracic spinal cord is normal with conus medularis at L1. Normal cauda equina nerve roots. Normal lumbar epidural space.  Lumbar disc degeneration L3-L4 through L5-S1. No spinal stenosis. Mild to moderate lower lumbar facet hypertrophy with trace facet joint fluid.  IMPRESSION: MR THORACIC SPINE IMPRESSION  1. No  evidence of thoracic spine infection. 2. Mild multifactorial degenerative spinal stenosis at T7-T8.  MR LUMBAR SPINE IMPRESSION  1. Abnormal bilateral erector spinae muscle signal, nonspecific and can be commonly seen in debilitated patient's. No strong evidence of infectious myositis at this time. No evidence of lumbar spinal infection. 2. Lower lumbar spine degeneration without lumbar spinal stenosis. 3. Chronic abdominal aortic disease status post aortic bypass.   Electronically Signed   By: Lars Pinks M.D.   On: 09/24/2013 21:45   Mr Lumbar Spine Wo Contrast  09/24/2013   CLINICAL DATA:  63 year old male with bacteremia. Query spinal infection.  Renal insufficiency precludes use of IV contrast at this time.  EXAM: MRI THORACIC AND LUMBAR SPINE WITHOUT CONTRAST  TECHNIQUE: Multiplanar and multiecho pulse sequences of the thoracic and lumbar spine were obtained without intravenous contrast.  COMPARISON:  Portable chest radiograph 09/22/2013 and earlier. Lumbar radiographs 09/20/2013. CTA pelvis 09/23/2008.  FINDINGS: MR THORACIC SPINE FINDINGS  Limited sagittal imaging of the cervical spine is remarkable for widespread chronic disc and endplate degeneration.  Preserved thoracic vertebral height and alignment. No marrow edema or evidence of acute osseous abnormality.  No abnormal prevertebral or paraspinal fluid.  Epidural lipomatosis maximal at the T5-T6 level partially effaces CSF from the thecal sac. Occasional small thoracic disc protrusions (T7-T8). Intermittent thoracic facet hypertrophy (moderate at T7-T8 -T10-T11). Borderline to mild thoracic spinal stenosis at T7-T8.  Spinal cord signal is within normal limits at all visualized levels. Conus medullaris demonstrated on the lumbar images.  Posterior paraspinal soft tissues are within normal limits. Grossly negative visualized thoracic viscera.  MR LUMBAR SPINE FINDINGS  Normal lumbar segmentation, congruent with the thoracic spine imaging. Stable lumbar  vertebral height and alignment. Minimal inferior endplate marrow edema at T12 appears to be degenerative in nature. No acute osseous abnormality identified.  There is widespread mild increased STIR signal in the posterior paraspinal muscles (erector spinae muscles bilaterally). This is nonspecific. No paraspinal fluid collection.  Negative visualized abdominal viscera except for irregularity the aorta and evidence of distal aortic bypass.  Visualized lower thoracic spinal cord is normal with conus medularis at L1. Normal cauda equina nerve roots. Normal lumbar epidural space.  Lumbar disc degeneration L3-L4 through L5-S1. No spinal stenosis. Mild to moderate lower lumbar facet hypertrophy with trace facet joint fluid.  IMPRESSION: MR THORACIC SPINE IMPRESSION  1. No evidence of thoracic spine infection. 2. Mild multifactorial degenerative spinal stenosis at T7-T8.  MR LUMBAR SPINE IMPRESSION  1. Abnormal bilateral erector spinae muscle signal, nonspecific and can be commonly seen in debilitated patient's. No strong evidence of infectious myositis at this time. No evidence of lumbar spinal infection. 2. Lower lumbar spine degeneration without lumbar spinal stenosis. 3. Chronic abdominal aortic disease status post aortic bypass.   Electronically Signed   By: Lars Pinks M.D.   On: 09/24/2013 21:45   Dg Chest Port 1 View  09/22/2013   CLINICAL DATA:  Shortness of breath, fever, evaluate for pneumonia  EXAM: PORTABLE CHEST - 1 VIEW  COMPARISON:  09/19/2013; 11/11/2012; 10/11/2012  FINDINGS: Grossly unchanged enlarged cardiac silhouette and mediastinal contours with atherosclerotic plaque within the thoracic aorta. Post median sternotomy CABG. The pulmonary vasculature remains indistinct with cephalization of flow. Kerley B-lines are identified within the peripheral aspect of the left mid and lower lung. No new focal airspace opacities. No definite pleural effusion pneumothorax. Unchanged bones.  IMPRESSION: Grossly  unchanged findings most suggestive of pulmonary edema, though note, atypical infection could have a similar appearance. Clinical correlation is advised. Further evaluation with a PA and lateral chest radiograph may be obtained as clinically indicated.   Electronically Signed   By: Sandi Mariscal M.D.   On: 09/22/2013 11:29   Dg Chest Port 1 View  09/19/2013   CLINICAL DATA:  Worsening shortness of breath, bilateral lower extremity swelling.  EXAM: PORTABLE CHEST - 1 VIEW  COMPARISON:  Chest radiograph November 11, 2012.  FINDINGS: Cardiac silhouette remains mildly enlarged mediastinal silhouette is nonsuspicious, status post median sternotomy. Mildly calcified aortic knob. Mild central pulmonary vasculature congestion and mild interstitial prominence, increased. Strandy densities in left lung base unchanged may reflect scar. No pleural effusions. No pneumothorax.  Multiple EKG lines overlie the patient and may obscure subtle underlying pathology. Soft tissue planes and included osseous structures are nonsuspicious.  IMPRESSION: Stable cardiomegaly with increasing central pulmonary vasculature congestion/ interstitial prominence concerning for pulmonary edema without pleural effusion or focal consolidation.   Electronically Signed   By: Elon Alas   On: 09/19/2013 20:10   Assessment/Plan:  63 year old male with AS s/p AVR/CABG(SVG to PDA and PL) /MAZE(10/08/12), pAF on amiodarone, COPD  who is admitted with volume overload, fever, now with Listeria blood CX.  - Acute decompensated diastolic heart failure,improved with high dose lasix and metolazone. On admit creat was 1.4.  - 8.6 liters out  - Wt 240 on admit, now 221.  - Continue lasix maintenance 40mg  QD.  Heart failure team consult noted. Appreciate their assistance as well. Reviewed note.  - Atrial fibrillation/flutter with RVR-- currently NSR with PAC occasional Wenckebach. Prior HR 110 regular and upon careful inspection of tele appears to be 2:1  aflutter conduction. Successful cardioversion 09/25/13. (last DCCV in Nov.2014 did not hold).  Now back in atrial fibrillation with CVR -continue AMIO.  -give him one last shot at rhythm control since he has been on AMIO long term. If he fails again (high likelihood with other comorbidities) continue with rate control. Stopped digoxin with worsening renal function. Would wait until respiratory status improves before DCCV again.  Will recheck LFTs today since they were elevated 2 days ago and he is on amio - Listeria +blood cultures - TEE negative for SBE - ID team and IM, ABX AMP. No evidence of endocarditis. Reassuring. MRI reassuring.  Per ID - will not pursue PICC line and will continue IV Ampicillin and transition to Bactrim on discharge for a total of 14 days of antibiotics starting at 6/3 for the first day.  Will need BMP check twice weekly x 1 week to ensure Creatinine stable.  Will have ID address how many days of IV Ampicillin he needs first.   - COPD (chronic obstructive pulmonary disease) - hypercarbia chronic. Quite significant. Poor air movement. Dr. Eliseo Squires is giving him nebs while here. Has seen pulmonary in the past. He is wheezing quite a bit today.  Will await further recs from Hospitalist - Anticoagulation on Eliquis restarted - had break in anticoagulation as outpatient.  - Cellulitis, chronic lower ext-dsg in place  - Debilitation, has never increased his energy to walk without walker, PT  - ETOH - no signs of delerium. Appreciate TRH.    Sueanne Margarita, MD  09/27/2013  9:10 AM

## 2013-09-28 DIAGNOSIS — F1010 Alcohol abuse, uncomplicated: Secondary | ICD-10-CM | POA: Diagnosis present

## 2013-09-28 LAB — BASIC METABOLIC PANEL
BUN: 38 mg/dL — ABNORMAL HIGH (ref 6–23)
CALCIUM: 8.6 mg/dL (ref 8.4–10.5)
CO2: 38 mEq/L — ABNORMAL HIGH (ref 19–32)
Chloride: 97 mEq/L (ref 96–112)
Creatinine, Ser: 1.42 mg/dL — ABNORMAL HIGH (ref 0.50–1.35)
GFR, EST AFRICAN AMERICAN: 59 mL/min — AB (ref >90)
GFR, EST NON AFRICAN AMERICAN: 51 mL/min — AB (ref >90)
Glucose, Bld: 140 mg/dL — ABNORMAL HIGH (ref 70–99)
Potassium: 4.9 mEq/L (ref 3.7–5.3)
Sodium: 141 mEq/L (ref 137–147)

## 2013-09-28 LAB — CBC
HCT: 36.2 % — ABNORMAL LOW (ref 39.0–52.0)
Hemoglobin: 10.6 g/dL — ABNORMAL LOW (ref 13.0–17.0)
MCH: 26.4 pg (ref 26.0–34.0)
MCHC: 29.3 g/dL — AB (ref 30.0–36.0)
MCV: 90 fL (ref 78.0–100.0)
PLATELETS: 219 10*3/uL (ref 150–400)
RBC: 4.02 MIL/uL — ABNORMAL LOW (ref 4.22–5.81)
RDW: 17.3 % — ABNORMAL HIGH (ref 11.5–15.5)
WBC: 7.2 10*3/uL (ref 4.0–10.5)

## 2013-09-28 MED ORDER — LIDOCAINE 5 % EX PTCH
1.0000 | MEDICATED_PATCH | CUTANEOUS | Status: DC
  Administered 2013-09-28 – 2013-09-30 (×3): 1 via TRANSDERMAL
  Filled 2013-09-28 (×3): qty 1

## 2013-09-28 MED ORDER — POTASSIUM CHLORIDE CRYS ER 10 MEQ PO TBCR
10.0000 meq | EXTENDED_RELEASE_TABLET | Freq: Every day | ORAL | Status: DC
  Administered 2013-09-29 – 2013-09-30 (×2): 10 meq via ORAL
  Filled 2013-09-28 (×2): qty 1

## 2013-09-28 NOTE — Progress Notes (Addendum)
SUBJECTIVE:  No complaints  OBJECTIVE:   Vitals:   Filed Vitals:   09/27/13 0356 09/27/13 1504 09/27/13 2100 09/28/13 0430  BP: 110/67 116/47 110/43 114/68  Pulse: 103 53 70 103  Temp: 97.9 F (36.6 C) 97.8 F (36.6 C) 98.6 F (37 C) 98 F (36.7 C)  TempSrc: Oral Oral Oral Oral  Resp: 18 18 16 18   Height:      Weight: 221 lb 12.8 oz (100.608 kg)   224 lb 12.8 oz (101.969 kg)  SpO2: 96% 97% 95% 97%   I&O's:   Intake/Output Summary (Last 24 hours) at 09/28/13 7353 Last data filed at 09/27/13 2203  Gross per 24 hour  Intake    945 ml  Output    800 ml  Net    145 ml   TELEMETRY: Reviewed telemetry pt in atrial fibrillation with CVR     PHYSICAL EXAM General: Well developed, well nourished, in no acute distress Head: Eyes PERRLA, No xanthomas.   Normal cephalic and atramatic  Lungs:   Clear bilaterally to auscultation but with decreased BS Heart:   HRRR S1 S2 Pulses are 2+ & equal. Abdomen: Bowel sounds are positive, abdomen soft and non-tender without masses \\Extremities :   No clubbing, cyanosis or edema.  DP +1 Neuro: Alert and oriented X 3. Psych:  Good affect, responds appropriately   LABS: Basic Metabolic Panel:  Recent Labs  09/27/13 0345 09/28/13 0310  NA 140 141  K 3.7 4.9  CL 95* 97  CO2 39* 38*  GLUCOSE 106* 140*  BUN 39* 38*  CREATININE 1.35 1.42*  CALCIUM 8.5 8.6   Liver Function Tests:  Recent Labs  09/25/13 2045 09/27/13 0345  AST 101* 89*  ALT 87* 85*  ALKPHOS 49 48  BILITOT 0.4 0.4  PROT 7.2 7.1  ALBUMIN 2.6* 2.6*   No results found for this basename: LIPASE, AMYLASE,  in the last 72 hours CBC:  Recent Labs  09/27/13 0345 09/28/13 0310  WBC 5.9 7.2  HGB 10.5* 10.6*  HCT 35.2* 36.2*  MCV 89.3 90.0  PLT 191 219   Cardiac Enzymes: No results found for this basename: CKTOTAL, CKMB, CKMBINDEX, TROPONINI,  in the last 72 hours BNP: No components found with this basename: POCBNP,  D-Dimer: No results found for this  basename: DDIMER,  in the last 72 hours Hemoglobin A1C: No results found for this basename: HGBA1C,  in the last 72 hours Fasting Lipid Panel: No results found for this basename: CHOL, HDL, LDLCALC, TRIG, CHOLHDL, LDLDIRECT,  in the last 72 hours Thyroid Function Tests: No results found for this basename: TSH, T4TOTAL, FREET3, T3FREE, THYROIDAB,  in the last 72 hours Anemia Panel:  Recent Labs  09/26/13 0358  VITAMINB12 648   Coag Panel:   Lab Results  Component Value Date   INR 1.04 09/19/2013   INR 1.33 02/25/2013   INR 1.82* 01/14/2013    RADIOLOGY: Dg Chest 2 View  09/26/2013   CLINICAL DATA:  Cough, fever, and shortness of breath.  Hypoxia.  EXAM: CHEST  2 VIEW  COMPARISON:  09/2013  FINDINGS: Sequelae of prior CABG, left atrial clipping, and aortic valve replacement are again identified. Cardiac silhouette remains mildly enlarged. Thoracic aortic calcification is noted. Pulmonary vascular congestion on the prior study has improved. Peripheral linear densities in the left mid to lower lung suggestive of Kerley B-lines are stable to slightly improved. Mildly increased interstitial densities in the right mid to lower lung are stable to slightly  improved. More prominent linear opacity in the basilar left lower lobe may reflect subsegmental atelectasis. There is no evidence of new, confluent airspace opacity, pleural effusion, or pneumothorax. No acute osseous abnormality is identified.  IMPRESSION: Overall mildly improved appearance of the chest with scattered, residual interstitial opacities most suggestive of mild edema. No confluent airspace opacity.   Electronically Signed   By: Logan Bores   On: 09/26/2013 12:54   Dg Lumbar Spine 2-3 Views  09/20/2013   CLINICAL DATA:  Lower lumbar pain  EXAM: LUMBAR SPINE - 2-3 VIEW  COMPARISON:  None.  FINDINGS: There are 5 nonrib bearing lumbar-type vertebral bodies. The vertebral body heights are maintained. The alignment is anatomic. There is no  spondylolysis. There is no acute fracture or static listhesis. Degenerative disc disease most significant at L3-4, L4-5 and L5-S1.  The SI joints are unremarkable.  Abdominal aortic atherosclerosis.  Left iliac stent noted.  IMPRESSION: Lumbar spine spondylosis at L3-4, L4-5 and L5-S1. No acute osseous injury of the lumbar spine.   Electronically Signed   By: Kathreen Devoid   On: 09/20/2013 17:20   Mr Thoracic Spine Wo Contrast  09/24/2013   CLINICAL DATA:  63 year old male with bacteremia. Query spinal infection.  Renal insufficiency precludes use of IV contrast at this time.  EXAM: MRI THORACIC AND LUMBAR SPINE WITHOUT CONTRAST  TECHNIQUE: Multiplanar and multiecho pulse sequences of the thoracic and lumbar spine were obtained without intravenous contrast.  COMPARISON:  Portable chest radiograph 09/22/2013 and earlier. Lumbar radiographs 09/20/2013. CTA pelvis 09/23/2008.  FINDINGS: MR THORACIC SPINE FINDINGS  Limited sagittal imaging of the cervical spine is remarkable for widespread chronic disc and endplate degeneration.  Preserved thoracic vertebral height and alignment. No marrow edema or evidence of acute osseous abnormality. No abnormal prevertebral or paraspinal fluid.  Epidural lipomatosis maximal at the T5-T6 level partially effaces CSF from the thecal sac. Occasional small thoracic disc protrusions (T7-T8). Intermittent thoracic facet hypertrophy (moderate at T7-T8 -T10-T11). Borderline to mild thoracic spinal stenosis at T7-T8.  Spinal cord signal is within normal limits at all visualized levels. Conus medullaris demonstrated on the lumbar images.  Posterior paraspinal soft tissues are within normal limits. Grossly negative visualized thoracic viscera.  MR LUMBAR SPINE FINDINGS  Normal lumbar segmentation, congruent with the thoracic spine imaging. Stable lumbar vertebral height and alignment. Minimal inferior endplate marrow edema at T12 appears to be degenerative in nature. No acute osseous  abnormality identified.  There is widespread mild increased STIR signal in the posterior paraspinal muscles (erector spinae muscles bilaterally). This is nonspecific. No paraspinal fluid collection.  Negative visualized abdominal viscera except for irregularity the aorta and evidence of distal aortic bypass.  Visualized lower thoracic spinal cord is normal with conus medularis at L1. Normal cauda equina nerve roots. Normal lumbar epidural space.  Lumbar disc degeneration L3-L4 through L5-S1. No spinal stenosis. Mild to moderate lower lumbar facet hypertrophy with trace facet joint fluid.  IMPRESSION: MR THORACIC SPINE IMPRESSION  1. No evidence of thoracic spine infection. 2. Mild multifactorial degenerative spinal stenosis at T7-T8.  MR LUMBAR SPINE IMPRESSION  1. Abnormal bilateral erector spinae muscle signal, nonspecific and can be commonly seen in debilitated patient's. No strong evidence of infectious myositis at this time. No evidence of lumbar spinal infection. 2. Lower lumbar spine degeneration without lumbar spinal stenosis. 3. Chronic abdominal aortic disease status post aortic bypass.   Electronically Signed   By: Lars Pinks M.D.   On: 09/24/2013 21:45   Mr  Lumbar Spine Wo Contrast  09/24/2013   CLINICAL DATA:  63 year old male with bacteremia. Query spinal infection.  Renal insufficiency precludes use of IV contrast at this time.  EXAM: MRI THORACIC AND LUMBAR SPINE WITHOUT CONTRAST  TECHNIQUE: Multiplanar and multiecho pulse sequences of the thoracic and lumbar spine were obtained without intravenous contrast.  COMPARISON:  Portable chest radiograph 09/22/2013 and earlier. Lumbar radiographs 09/20/2013. CTA pelvis 09/23/2008.  FINDINGS: MR THORACIC SPINE FINDINGS  Limited sagittal imaging of the cervical spine is remarkable for widespread chronic disc and endplate degeneration.  Preserved thoracic vertebral height and alignment. No marrow edema or evidence of acute osseous abnormality. No abnormal  prevertebral or paraspinal fluid.  Epidural lipomatosis maximal at the T5-T6 level partially effaces CSF from the thecal sac. Occasional small thoracic disc protrusions (T7-T8). Intermittent thoracic facet hypertrophy (moderate at T7-T8 -T10-T11). Borderline to mild thoracic spinal stenosis at T7-T8.  Spinal cord signal is within normal limits at all visualized levels. Conus medullaris demonstrated on the lumbar images.  Posterior paraspinal soft tissues are within normal limits. Grossly negative visualized thoracic viscera.  MR LUMBAR SPINE FINDINGS  Normal lumbar segmentation, congruent with the thoracic spine imaging. Stable lumbar vertebral height and alignment. Minimal inferior endplate marrow edema at T12 appears to be degenerative in nature. No acute osseous abnormality identified.  There is widespread mild increased STIR signal in the posterior paraspinal muscles (erector spinae muscles bilaterally). This is nonspecific. No paraspinal fluid collection.  Negative visualized abdominal viscera except for irregularity the aorta and evidence of distal aortic bypass.  Visualized lower thoracic spinal cord is normal with conus medularis at L1. Normal cauda equina nerve roots. Normal lumbar epidural space.  Lumbar disc degeneration L3-L4 through L5-S1. No spinal stenosis. Mild to moderate lower lumbar facet hypertrophy with trace facet joint fluid.  IMPRESSION: MR THORACIC SPINE IMPRESSION  1. No evidence of thoracic spine infection. 2. Mild multifactorial degenerative spinal stenosis at T7-T8.  MR LUMBAR SPINE IMPRESSION  1. Abnormal bilateral erector spinae muscle signal, nonspecific and can be commonly seen in debilitated patient's. No strong evidence of infectious myositis at this time. No evidence of lumbar spinal infection. 2. Lower lumbar spine degeneration without lumbar spinal stenosis. 3. Chronic abdominal aortic disease status post aortic bypass.   Electronically Signed   By: Lars Pinks M.D.   On:  09/24/2013 21:45   Dg Chest Port 1 View  09/22/2013   CLINICAL DATA:  Shortness of breath, fever, evaluate for pneumonia  EXAM: PORTABLE CHEST - 1 VIEW  COMPARISON:  09/19/2013; 11/11/2012; 10/11/2012  FINDINGS: Grossly unchanged enlarged cardiac silhouette and mediastinal contours with atherosclerotic plaque within the thoracic aorta. Post median sternotomy CABG. The pulmonary vasculature remains indistinct with cephalization of flow. Kerley B-lines are identified within the peripheral aspect of the left mid and lower lung. No new focal airspace opacities. No definite pleural effusion pneumothorax. Unchanged bones.  IMPRESSION: Grossly unchanged findings most suggestive of pulmonary edema, though note, atypical infection could have a similar appearance. Clinical correlation is advised. Further evaluation with a PA and lateral chest radiograph may be obtained as clinically indicated.   Electronically Signed   By: Sandi Mariscal M.D.   On: 09/22/2013 11:29   Dg Chest Port 1 View  09/19/2013   CLINICAL DATA:  Worsening shortness of breath, bilateral lower extremity swelling.  EXAM: PORTABLE CHEST - 1 VIEW  COMPARISON:  Chest radiograph November 11, 2012.  FINDINGS: Cardiac silhouette remains mildly enlarged mediastinal silhouette is nonsuspicious, status post median  sternotomy. Mildly calcified aortic knob. Mild central pulmonary vasculature congestion and mild interstitial prominence, increased. Strandy densities in left lung base unchanged may reflect scar. No pleural effusions. No pneumothorax.  Multiple EKG lines overlie the patient and may obscure subtle underlying pathology. Soft tissue planes and included osseous structures are nonsuspicious.  IMPRESSION: Stable cardiomegaly with increasing central pulmonary vasculature congestion/ interstitial prominence concerning for pulmonary edema without pleural effusion or focal consolidation.   Electronically Signed   By: Elon Alas   On: 09/19/2013 20:10    Assessment/Plan:  63 year old male with AS s/p AVR/CABG(SVG to PDA and PL) /MAZE(10/08/12), pAF on amiodarone, COPD who is admitted with volume overload, fever, now with Listeria blood CX.  - Acute decompensated diastolic heart failure,improved with high dose lasix and metolazone. On admit creat was 1.4.  - 8.6 liters out  - Wt 240 on admit, now 224.  He is up 4 lbs since Friday.  Lasix was on hold until Friday due to bump in creatinine.  Now restarted. - Continue lasix maintenance 40mg  QD. Decrease potassium to 93meq daily due to potassium trending upward Heart failure team consult noted. Appreciate their assistance as well. Reviewed note.  - Atrial fibrillation/flutter with RVR-- currently NSR with PAC occasional Wenckebach. Prior HR 110 regular and upon careful inspection of tele appears to be 2:1 aflutter conduction. Successful cardioversion 09/25/13. (last DCCV in Nov.2014 did not hold). Now back in atrial fibrillation with CVR  -continue AMIO.  - DR. Skains felt we should give him one last shot at rhythm control since he has been on AMIO long term. If he fails again (high likelihood with other comorbidities) continue with rate control. Stopped digoxin with worsening renal function. Will make NPO for possible DCCV again in am. LFTs  were elevated 2 days ago but stable on recheck yesterday and he is on amio - will need to follow closely (most likely has fatty liver and ETOH liver disease) - Listeria +blood cultures - TEE negative for SBE - ID team and IM, ABX AMP. No evidence of endocarditis. Reassuring. MRI reassuring. Per ID - will not pursue PICC line and will continue IV Ampicillin and transition to Bactrim on discharge for a total of 14 days of antibiotics starting at 6/3 for the first day. Will need BMP check twice weekly x 1 week to ensure Creatinine stable.  - COPD (chronic obstructive pulmonary disease) - hypercarbia chronic. Quite significant. Poor air movement. Dr. Eliseo Squires is giving him nebs  while here. Has seen pulmonary in the past. He is no longer wheezing but decreased air movement.  Started on steroids yesterday.   Will await further recs from Hospitalist  - Anticoagulation on Eliquis restarted - had break in anticoagulation as outpatient.  - Cellulitis, chronic lower ext-dsg in place  - Debilitation, has never increased his energy to walk without walker, PT  - ETOH - no signs of delerium. Appreciate TRH.    Sueanne Margarita, MD  09/28/2013  9:42 AM

## 2013-09-28 NOTE — Progress Notes (Signed)
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 No evidence of thoracic spine infection.  2. Mild multifactorial degenerative spinal stenosis at T7-T8.  MR LUMBAR SPINE IMPRESSION  1. Abnormal bilateral erector spinae muscle signal, nonspecific and  can be commonly seen in debilitated patient's. No strong evidence of  infectious myositis at this time. No evidence of lumbar spinal  infection.  2. Lower lumbar spine degeneration without lumbar spinal stenosis.  3. Chronic abdominal aortic disease status post aortic bypass. -abx per ID -TEE ok - per ID- continue on ampicillin 2gm IV Q6hr. Since TEE and MRI is negative, will treat for 2 wk, using 6/3 as day 1 of 14 of antibiotics. For discharge can transition him to oral bactrim since he will likely have difficulty with picc line but most importantly we have eliminated disseminated disease. Can switch to bactrim DS 2 tabs Q 8hr. Recommend to check bmp twice a week x 1 wk to ensure cr is not significantly elevated.   Back pain -MRI- see above- pain in better Neurosurgery referral for spinal stenosis -could add lidocaine patch if continues  Alcohol abuse -CIWA- past symptoms for withdrawal  Atrial fibrillation with RVR  Currently rate controlled on amiodarone, carvedilol. His CHADS2 score is 2.   Acute decompensated heart failure  Lasix  On aspirin and digoxin as well. Has negative balance.   COPD (chronic obstructive pulmonary disease)  No signs of exacerbation. Continue O2 via nasal cannula and albuterol nebs  - add atrovent neb whole here- PFTs in 2014 showed severe emphysema- was given Breo with as needed albuterol- resume this at d/c - PA and lateral x ray- show some fluid still, no PNA  Acute resp failure-  Will need O2 at home  CAD  On  aspirin, statin and carvedilol   Deconditioning  PT eval - home health?  Chronic Left leg cellulitis and peripheral vascular disease  No signs of acute infection and as per pt the leg appears much better now. Dressing as per wound care consult  Duplex negative for DVT  Acute kidney injury  Has good urine output. Continue to monitor   Code Status: full Family Communication: patient/daughter on phone yest Disposition Plan: spoke at length with patient about quitting drinking   HPI/Subjective: Breathing mildly better    Objective: Filed Vitals:   09/28/13 0430  BP: 114/68  Pulse: 103  Temp: 98 F (36.7 C)  Resp: 18    Intake/Output Summary (Last 24 hours) at 09/28/13 1121 Last data filed at 09/28/13 0954  Gross per 24 hour  Intake   1065 ml  Output    800 ml  Net    265 ml   Filed Weights   09/26/13 0438 09/27/13 0356 09/28/13 0430  Weight: 100.1 kg (220 lb 10.9 oz) 100.608 kg (221 lb 12.8 oz) 101.969 kg (224 lb 12.8 oz)    Exam:   General:  Pleasant/cooperative- A+Ox3, NAD  Cardiovascular: irr  Respiratory: decrease breath sounds- air moving better on left lung- b/l expiratory wheezing  Abdomen: +Bs, soft  Musculoskeletal: moves all 4 ext   Data Reviewed: Basic Metabolic Panel:  Recent Labs Lab 09/25/13 0352 09/25/13 2045 09/26/13 0358 09/27/13 0345 09/28/13 0310  NA 138 139 140 140 141  K 3.7 3.9 4.0 3.7 4.9  CL 90* 92* 93* 95* 97  CO2 42* 43* 44* 39* 38*  GLUCOSE 116* 125* 100* 106* 140*  BUN 44* 41* 39* 39* 38*  CREATININE 1.50* 1.50* 1.48* 1.35 1.42*  CALCIUM 8.8 8.5 8.8 8.5 8.6   Liver Function Tests:  Recent Labs Lab 09/25/13 2045 09/27/13 0345  AST 101* 89*  ALT 87* 85*  ALKPHOS 49 48  BILITOT 0.4 0.4  PROT 7.2 7.1  ALBUMIN 2.6* 2.6*   No results found for this basename: LIPASE, AMYLASE,  in the last 168 hours  Recent Labs Lab 09/25/13 2045  AMMONIA 25   CBC:  Recent Labs Lab 09/24/13 0402 09/25/13 0352  09/26/13 0358 09/27/13 0345 09/28/13 0310  WBC 5.8 5.0 5.2 5.9 7.2  HGB 10.9* 11.2* 10.7* 10.5* 10.6*  HCT 35.7* 36.6* 35.6* 35.2* 36.2*  MCV 88.8 89.5 90.4 89.3 90.0  PLT 172 172 179 191 219   Cardiac Enzymes: No results found for this basename: CKTOTAL, CKMB, CKMBINDEX, TROPONINI,  in the last 168 hours BNP (last 3 results)  Recent Labs  10/01/12 0415 09/19/13 1941 09/24/13 0400  PROBNP 11193.0* 3530.0* 1792.0*   CBG: No results found for this basename: GLUCAP,  in the last 168 hours  Recent Results (from the past 240 hour(s))  CULTURE, BLOOD (ROUTINE X 2)     Status: None   Collection Time    09/21/13 10:11 AM      Result Value Ref Range Status   Specimen Description BLOOD RIGHT ARM   Final   Special Requests BOTTLES DRAWN AEROBIC AND ANAEROBIC 5CC   Final   Culture  Setup Time     Final   Value: 09/21/2013 18:11     Performed at [facility]   Culture     Final   Value: LISTERIA MONOCYTOGENES     Note: Recommended therapy: ampicillin with or without an aminoglycoside. Cephalosporins are not effective. CRITICAL RESULT CALLED TO, READ BACK BY AND VERIFIED WITH: OLIVIA STEIN 09/24/13 0800 BY SMITHERSJ FAXED TO [place] [facility] CONNIE WEANT [id number] BY      PEAKY     Note: Gram Stain Report Called to,Read Back By and Verified With: HEATHER B BY INGRAM A 09/22/13 1205PM     Performed at [facility]   Report Status 09/25/2013 FINAL   Final  CULTURE, BLOOD (ROUTINE X 2)     Status: None   Collection Time    09/21/13 10:16 AM      Result Value Ref Range Status   Specimen Description BLOOD RIGHT ARM   Final   Special Requests BOTTLES DRAWN AEROBIC AND ANAEROBIC 5CC   Final   Culture  Setup Time     Final   Value: 09/21/2013 18:11     Performed at [facility]   Culture     Final   Value: LISTERIA MONOCYTOGENES     Note: Recommended therapy: ampicillin with or without an aminoglycoside. Cephalosporins are not effective. CRITICAL RESULT CALLED TO, READ  BACK BY AND VERIFIED WITH: OLIVIA STEIN 09/24/13 0800 BY SMITHERSJ FAXED TO [place]ANT [id number] BY      PEAKY     Note: Gram Stain Report Called to,Read Back By and Verified With: HEATHER B BY INGRAM A 09/22/13 1205PM     Performed at [facility]   Report Status 09/25/2013 FINAL   Final  URINE CULTURE     Status: None   Collection Time    09/21/13 10:24 AM      Result  Value Ref Range Status   Specimen Description URINE, RANDOM   Final   Special Requests Normal   Final   Culture  Setup Time     Final   Value: 09/21/2013 18:41     Performed at [place]     Final   Value: 25,000 COLONIES/ML     Performed at [facility]   Culture     Final   Value: Multiple bacterial morphotypes present, none predominant. Suggest appropriate recollection if clinically indicated.     Performed at [facility]   Report Status 09/22/2013 FINAL   Final  CULTURE, BLOOD (ROUTINE X 2)     Status: None   Collection Time    09/22/13  1:56 PM      Result Value Ref Range Status   Specimen Description BLOOD LEFT HAND   Final   Special Requests     Final   Value: BOTTLES DRAWN AEROBIC AND ANAEROBIC 10CC BLUE  8CC RED   Culture  Setup Time     Final   Value: 09/22/2013 18:23     Performed at [facility]   Culture     Final   Value: LISTERIA MONOCYTOGENES     Note: Recommended therapy: ampicillin with or without an aminoglycoside. Cephalosporins are not effective.     Note: Gram Stain Report Called to,Read Back By and Verified With: HEATHER B@[id number]  ON [id number] BY [facility]     Performed at [facility]   Report Status 09/26/2013 FINAL   Final  CULTURE, BLOOD (ROUTINE X 2)     Status: None   Collection Time    09/22/13  2:02 PM      Result Value Ref Range Status   Specimen Description BLOOD RIGHT HAND   Final   Special Requests BOTTLES DRAWN AEROBIC ONLY 10CC   Final   Culture  Setup Time     Final   Value: 09/22/2013 18:23      Performed at [facility]   Culture     Final   Value: LISTERIA MONOCYTOGENES     Note: Recommended therapy: ampicillin with or without an aminoglycoside. Cephalosporins are not effective.     Note: Gram Stain Report Called to,Read Back By and Verified With: BREANA DAVIS ON 09/23/2013 AT 7:21P BY Dennard Nip     Performed at [facility]   Report Status 09/26/2013 FINAL   Final     Studies: Dg Chest 2 View  09/26/2013   CLINICAL DATA:  Cough, fever, and shortness of breath.  Hypoxia.  EXAM: CHEST  2 VIEW  COMPARISON:  09/2013  FINDINGS: Sequelae of prior CABG, left atrial clipping, and aortic valve replacement are again identified. Cardiac silhouette remains mildly enlarged. Thoracic aortic calcification is noted. Pulmonary vascular congestion on the prior study has improved. Peripheral linear densities in the left mid to lower lung suggestive of Kerley B-lines are stable to slightly improved. Mildly increased interstitial densities in the right mid to lower lung are stable to slightly improved. More prominent linear opacity in the basilar left lower lobe may reflect subsegmental atelectasis. There is no evidence of new, confluent airspace opacity, pleural effusion, or pneumothorax. No acute osseous abnormality is identified.  IMPRESSION: Overall mildly improved appearance of the chest with scattered, residual interstitial opacities most suggestive of mild edema. No confluent airspace opacity.   Electronically Signed   By: Logan Bores   On: 09/26/2013 12:54    Scheduled  Meds: . amiodarone  200 mg Oral BID  . ampicillin (OMNIPEN) IV  2 g Intravenous 4 times per day  . apixaban  5 mg Oral BID  . atorvastatin  20 mg Oral q1800  . carvedilol  25 mg Oral BID WC  . folic acid  1 mg Oral Daily  . furosemide  40 mg Oral Daily  . multivitamin with minerals  1 tablet Oral Daily  . [START ON 09/29/2013] potassium chloride SA  10 mEq Oral Daily  . predniSONE  40 mg Oral Q breakfast  . sodium  chloride  3 mL Intravenous Q12H  . thiamine  100 mg Oral Daily   Continuous Infusions:   Antibiotics Given (last 72 hours)   Date/Time Action Medication Dose Rate   09/25/13 2215 Given   ampicillin (OMNIPEN) 2 g in sodium chloride 0.9 % 50 mL IVPB 2 g 150 mL/hr   09/26/13 0618 Given   ampicillin (OMNIPEN) 2 g in sodium chloride 0.9 % 50 mL IVPB 2 g 150 mL/hr   09/26/13 1222 Given   ampicillin (OMNIPEN) 2 g in sodium chloride 0.9 % 50 mL IVPB 2 g 150 mL/hr   09/26/13 1850 Given   ampicillin (OMNIPEN) 2 g in sodium chloride 0.9 % 50 mL IVPB 2 g 150 mL/hr   09/27/13 0015 Given   ampicillin (OMNIPEN) 2 g in sodium chloride 0.9 % 50 mL IVPB 2 g 150 mL/hr   09/27/13 0655 Given   ampicillin (OMNIPEN) 2 g in sodium chloride 0.9 % 50 mL IVPB 2 g 150 mL/hr   09/27/13 1241 Given   ampicillin (OMNIPEN) 2 g in sodium chloride 0.9 % 50 mL IVPB 2 g 150 mL/hr   09/27/13 1706 Given   ampicillin (OMNIPEN) 2 g in sodium chloride 0.9 % 50 mL IVPB 2 g 150 mL/hr   09/27/13 2300 Given   ampicillin (OMNIPEN) 2 g in sodium chloride 0.9 % 50 mL IVPB 2 g 150 mL/hr   09/28/13 0654 Given   ampicillin (OMNIPEN) 2 g in sodium chloride 0.9 % 50 mL IVPB 2 g 150 mL/hr      Principal Problem:   Acute decompensated heart failure Active Problems:   Atrial fibrillation with RVR   Fever of unknown origin (FUO)   COPD (chronic obstructive pulmonary disease)   Acute respiratory distress   Listeria sepsis    Time spent: 35 min    [facility] Pager [phone]. If 7PM-7AM, please contact night-coverage at [email or web address], password [email or web address] 09/28/2013, 11:21 AM  LOS: 9 days

## 2013-09-29 DIAGNOSIS — I4891 Unspecified atrial fibrillation: Secondary | ICD-10-CM

## 2013-09-29 DIAGNOSIS — J984 Other disorders of lung: Secondary | ICD-10-CM

## 2013-09-29 DIAGNOSIS — F1010 Alcohol abuse, uncomplicated: Secondary | ICD-10-CM

## 2013-09-29 LAB — BASIC METABOLIC PANEL
BUN: 43 mg/dL — ABNORMAL HIGH (ref 6–23)
CALCIUM: 8.5 mg/dL (ref 8.4–10.5)
CO2: 38 mEq/L — ABNORMAL HIGH (ref 19–32)
Chloride: 98 mEq/L (ref 96–112)
Creatinine, Ser: 1.35 mg/dL (ref 0.50–1.35)
GFR calc Af Amer: 63 mL/min — ABNORMAL LOW (ref >90)
GFR calc non Af Amer: 54 mL/min — ABNORMAL LOW (ref >90)
GLUCOSE: 109 mg/dL — AB (ref 70–99)
POTASSIUM: 4.1 meq/L (ref 3.7–5.3)
SODIUM: 142 meq/L (ref 137–147)

## 2013-09-29 LAB — CBC
HCT: 37 % — ABNORMAL LOW (ref 39.0–52.0)
HEMOGLOBIN: 10.7 g/dL — AB (ref 13.0–17.0)
MCH: 26.5 pg (ref 26.0–34.0)
MCHC: 28.9 g/dL — ABNORMAL LOW (ref 30.0–36.0)
MCV: 91.6 fL (ref 78.0–100.0)
PLATELETS: 216 10*3/uL (ref 150–400)
RBC: 4.04 MIL/uL — ABNORMAL LOW (ref 4.22–5.81)
RDW: 17.2 % — ABNORMAL HIGH (ref 11.5–15.5)
WBC: 7.4 10*3/uL (ref 4.0–10.5)

## 2013-09-29 MED ORDER — DIGOXIN 250 MCG PO TABS
0.2500 mg | ORAL_TABLET | Freq: Every day | ORAL | Status: DC
  Administered 2013-09-30: 0.25 mg via ORAL
  Filled 2013-09-29: qty 1

## 2013-09-29 MED ORDER — DIGOXIN 0.05 MG/ML PO SOLN: 0.2500 mg | Freq: Every day | ORAL | Status: DC

## 2013-09-29 NOTE — Progress Notes (Signed)
SATURATION QUALIFICATIONS: (This note is used to comply with regulatory documentation for home oxygen)  Patient Saturations on Room Air at Rest = 96%  Patient Saturations on Room Air while Ambulating = 85%  Patient Saturations on 3 Liters of oxygen while Ambulating = 92-94%  Please briefly explain why patient needs home oxygen:Continues to desat on RA with activity.  Needs O2 with activity.  Thanks. [facility] [phone] (pager)

## 2013-09-29 NOTE — Consult Note (Signed)
ELECTROPHYSIOLOGY CONSULT NOTE   Patient ID: [name] MRN: [id number], DOB/AGE: [birth year]   Admit date: 09/19/2013 Date of Consult: 09/29/2013  Primary Physician: No PCP Per Patient Primary Cardiologist: Gwenlyn Found, MD Reason for Consultation: Atrial fibrillation  History of Present Illness [name] is a 63 y.o. male with AS s/p tissue AVR June 2014, CAD s/p single vessel CABG June 2014, atrial fibrillation s/p MAZE June 2025, chronic diastolic HF, alcohol abuse, PVD and COPD who presented 09/19/2013 with acute on chronic diastolic HF and rapid atrial fibrillation.   He reports increasing SOB and orthopnea x 3 months. He has also had increasing leg swelling. He denies CP or palpitations. He has had atrial fibrillation since July 2014. He was started on amiodarone and underwent DCCV Nov 2014 which was successful for restoring SR. He was continued on amiodarone and Eliquis. However, he stopped Eliquis in Nov 2014 due to cost. He also took Coumadin for 4-5 weeks ("because I had that leftover at home") without any monitoring. He has not been anticoagulated since that time. He has been lost to follow-up until he presented on 09/19/2013. He tells me he did not feel better while in SR. 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 ID is following. TEE was done. No evidence of endocarditis or LA/LAA clot. He was then cardioverted to SR on 09/25/2013. He reverted back to atrial fibrillation within 36 hours. EP has been asked to see in consultation for management of atrial fibrillation.    Past Medical History Past Medical History  Diagnosis Date  . Hypertension   . Gout   . Aortic stenosis     echo 06/19/08 with nomal LV function, moderate concentric LVH moderate aortic stenosis area 0.99 cm squared, peak gradient of 50 and mean of 31  . Atrial fibrillation   . Hyperlipidemia   . Atrial fibrillation with RVR,  was in atrial fib in 04/2011 09/16/2012  . Tobacco use 09/16/2012  . Heart murmur   . Acute combined systolic and diastolic CHF, NYHA class 3 -- previous normal LV function and moderate AS- re-evaluate; EF now 30-35% with regional [facility] 09/16/2012  . Aortic stenosis, severe 09/23/2012  . CAD (coronary artery disease), single vessel disease 09/23/2012  . Cellulitis, lower extremity- treated 09/23/2012  . PAD (peripheral artery disease), decreased bil. ABIs 09/23/2012  . Gout flare. 09/29/12, Lt knee, improved with colchicine. 09/30/2012  . H/O aortic valve replacement   . Congestive heart failure   . Dysrhythmia     afib    Past Surgical History Past Surgical History  Procedure Laterality Date  . Iliac artery stent  10/20/08    stent to lt iliac  . Aorto bifem bypass  12/18/08    by Dr. Trula Slade  . Coronary artery bypass graft N/A 10/08/2012    Procedure: CORONARY ARTERY BYPASS GRAFTING (CABG);  Surgeon: Grace Isaac, MD;  Location: [place];  Service: Open Heart Surgery;  Laterality: N/A;  Coronary Artery bypass Graft times two utilizing the left greater saphenous vein harvested endoscopically  . Aortic valve replacement N/A 10/08/2012    Procedure: AORTIC VALVE REPLACEMENT (AVR);  Surgeon: Grace Isaac, MD;  Location: [place];  Service: Open Heart Surgery;  Laterality: N/A;  . Maze N/A 10/08/2012    Procedure: MAZE;  Surgeon: Grace Isaac, MD;  Location: [place];  Service: Open Heart Surgery;  Laterality: N/A;  .  Intraoperative transesophageal echocardiogram N/A 10/08/2012    Procedure: INTRAOPERATIVE TRANSESOPHAGEAL ECHOCARDIOGRAM;  Surgeon: Grace Isaac, MD;  Location: [place];  Service: Open Heart Surgery;  Laterality: N/A;  . Endovein harvest of greater saphenous vein Bilateral 10/08/2012    Procedure: ENDOVEIN HARVEST OF GREATER SAPHENOUS VEIN;  Surgeon: Grace Isaac, MD;  Location: [place];  Service: Open Heart Surgery;  Laterality: Bilateral;  . US echocardiography  06/20/2011    mod  concentric LVH,LA severely dilated,RA mildly dilated,mod. ca+ of the mitral apparatus,trace MR,mod. ca+ AOV w/stenosis.  Marland Kitchen Nm myocar perf wall motion  08/25/2008    normal  . Cardioversion N/A 02/25/2013    Procedure: CARDIOVERSION;  Surgeon: Sanda Klein, MD;  Location: [place];  Service: Cardiovascular;  Laterality: N/A;  . Tee without cardioversion N/A 09/25/2013    Procedure: TRANSESOPHAGEAL ECHOCARDIOGRAM (TEE);  Surgeon: Thayer Headings, MD;  Location: [place];  Service: Cardiovascular;  Laterality: N/A;  . Cardioversion N/A 09/25/2013    Procedure: CARDIOVERSION;  Surgeon: Thayer Headings, MD;  Location: [facility] ENDOSCOPY;  Service: Cardiovascular;  Laterality: N/A;    Allergies/Intolerances No Known Allergies  Inpatient Medications . amiodarone  200 mg Oral BID  . ampicillin (OMNIPEN) IV  2 g Intravenous 4 times per day  . apixaban  5 mg Oral BID  . atorvastatin  20 mg Oral q1800  . carvedilol  25 mg Oral BID WC  . folic acid  1 mg Oral Daily  . furosemide  40 mg Oral Daily  . lidocaine  1 patch Transdermal Q24H  . multivitamin with minerals  1 tablet Oral Daily  . potassium chloride SA  10 mEq Oral Daily  . predniSONE  40 mg Oral Q breakfast  . sodium chloride  3 mL Intravenous Q12H  . thiamine  100 mg Oral Daily    Family History Family History  Problem Relation Age of Onset  . Heart attack Father     died at 52 with MI  . Heart disease Brother   . Heart attack Brother   . Heart attack Brother      Social History History   Social History  . Marital Status: Divorced    Spouse Name: N/A    Number of Children: N/A  . Years of Education: N/A   Occupational History  . retired    Social History Main Topics  . Smoking status: Former Smoker -- 1.00 packs/day for 43 years    Types: Cigarettes    Quit date: 09/12/2012  . Smokeless tobacco: Never Used     Comment: More than 43 + pack years as he smoked up to 2 1/2 ppd for many years. Had a period of cessation  after femoral stent.  . Alcohol Use: 25.2 - 50.4 oz/week    42-84 Cans of beer per week     Comment: 6-12 cans of beer a day  . Drug Use: No  . Sexual Activity: Not on file   Other Topics Concern  . Not on file   Social History Narrative  . No narrative on file     Review of Systems General: No chills, fever, night sweats or weight changes  Cardiovascular:  No chest pain, dyspnea on exertion, edema, orthopnea, palpitations, paroxysmal nocturnal dyspnea Dermatological: No rash, lesions or masses Respiratory: No cough, dyspnea Urologic: No hematuria, dysuria Abdominal: No nausea, vomiting, diarrhea, bright red blood per rectum, melena, or hematemesis Neurologic: No visual changes, weakness, changes in mental status All other systems reviewed  and are otherwise negative except as noted above.  Physical Exam Vitals: Blood pressure 113/60, pulse 78, temperature 96.9 F (36.1 C), temperature source Oral, resp. rate 18, height 5\' 10"  (1.778 m), weight 227 lb 1.6 oz (103.012 kg), SpO2 95.00%.  General: Well developed, well appearing 63 y.o. male in no acute distress. HEENT: Normocephalic, atraumatic. EOMs intact. Sclera nonicteric. Oropharynx clear.  Neck: Supple. No JVD. Lungs: Respirations regular and unlabored, CTA bilaterally. No wheezes, rales or rhonchi. Heart: Irregular. S1, S2 present. No murmurs, rub, S3 or S4. Abdomen: Soft, non-tender, non-distended. BS present x 4 quadrants. No hepatosplenomegaly.  Extremities: No clubbing, cyanosis or edema. DP/PT/Radials 2+ and equal bilaterally. Psych: Normal affect. Neuro: Alert and oriented X 3. Moves all extremities spontaneously. Musculoskeletal: No kyphosis. Skin: Intact. Warm and dry. No rashes or petechiae in exposed areas.   Labs Lab Results  Component Value Date   WBC 7.4 09/29/2013   HGB 10.7* 09/29/2013   HCT 37.0* 09/29/2013   MCV 91.6 09/29/2013   PLT 216 09/29/2013    Recent Labs Lab 09/27/13 0345  09/29/13 0443  NA 140   < > 142  K 3.7  < > 4.1  CL 95*  < > 98  CO2 39*  < > 38*  BUN 39*  < > 43*  CREATININE 1.35  < > 1.35  CALCIUM 8.5  < > 8.5  PROT 7.1  --   --   BILITOT 0.4  --   --   ALKPHOS 48  --   --   ALT 85*  --   --   AST 89*  --   --   GLUCOSE 106*  < > 109*  < > = values in this interval not displayed.   Radiology/Studies Dg Chest 2 View  09/26/2013   CLINICAL DATA:  Cough, fever, and shortness of breath.  Hypoxia.  EXAM: CHEST  2 VIEW  COMPARISON:  09/2013  FINDINGS: Sequelae of prior CABG, left atrial clipping, and aortic valve replacement are again identified. Cardiac silhouette remains mildly enlarged. Thoracic aortic calcification is noted. Pulmonary vascular congestion on the prior study has improved. Peripheral linear densities in the left mid to lower lung suggestive of Kerley B-lines are stable to slightly improved. Mildly increased interstitial densities in the right mid to lower lung are stable to slightly improved. More prominent linear opacity in the basilar left lower lobe may reflect subsegmental atelectasis. There is no evidence of new, confluent airspace opacity, pleural effusion, or pneumothorax. No acute osseous abnormality is identified.  IMPRESSION: Overall mildly improved appearance of the chest with scattered, residual interstitial opacities most suggestive of mild edema. No confluent airspace opacity.   Electronically Signed   By: Logan Bores   On: 09/26/2013 12:54   Mr Thoracic Spine Wo Contrast  09/24/2013   CLINICAL DATA:  63 year old male with bacteremia. Query spinal infection.  Renal insufficiency precludes use of IV contrast at this time.  EXAM: MRI THORACIC AND LUMBAR SPINE WITHOUT CONTRAST  TECHNIQUE: Multiplanar and multiecho pulse sequences of the thoracic and lumbar spine were obtained without intravenous contrast.  COMPARISON:  Portable chest radiograph 09/22/2013 and earlier. Lumbar radiographs 09/20/2013. CTA pelvis 09/23/2008.  FINDINGS: MR THORACIC SPINE  FINDINGS  Limited sagittal imaging of the cervical spine is remarkable for widespread chronic disc and endplate degeneration.  Preserved thoracic vertebral height and alignment. No marrow edema or evidence of acute osseous abnormality. No abnormal prevertebral or paraspinal fluid.  Epidural lipomatosis maximal at the T5-T6  level partially effaces CSF from the thecal sac. Occasional small thoracic disc protrusions (T7-T8). Intermittent thoracic facet hypertrophy (moderate at T7-T8 -T10-T11). Borderline to mild thoracic spinal stenosis at T7-T8.  Spinal cord signal is within normal limits at all visualized levels. Conus medullaris demonstrated on the lumbar images.  Posterior paraspinal soft tissues are within normal limits. Grossly negative visualized thoracic viscera.  MR LUMBAR SPINE FINDINGS  Normal lumbar segmentation, congruent with the thoracic spine imaging. Stable lumbar vertebral height and alignment. Minimal inferior endplate marrow edema at T12 appears to be degenerative in nature. No acute osseous abnormality identified.  There is widespread mild increased STIR signal in the posterior paraspinal muscles (erector spinae muscles bilaterally). This is nonspecific. No paraspinal fluid collection.  Negative visualized abdominal viscera except for irregularity the aorta and evidence of distal aortic bypass.  Visualized lower thoracic spinal cord is normal with conus medularis at L1. Normal cauda equina nerve roots. Normal lumbar epidural space.  Lumbar disc degeneration L3-L4 through L5-S1. No spinal stenosis. Mild to moderate lower lumbar facet hypertrophy with trace facet joint fluid.  IMPRESSION: MR THORACIC SPINE IMPRESSION  1. No evidence of thoracic spine infection. 2. Mild multifactorial degenerative spinal stenosis at T7-T8.  MR LUMBAR SPINE IMPRESSION  1. Abnormal bilateral erector spinae muscle signal, nonspecific and can be commonly seen in debilitated patient's. No strong evidence of infectious  myositis at this time. No evidence of lumbar spinal infection. 2. Lower lumbar spine degeneration without lumbar spinal stenosis. 3. Chronic abdominal aortic disease status post aortic bypass.   Electronically Signed   By: Lars Pinks M.D.   On: 09/24/2013 21:45   Mr Lumbar Spine Wo Contrast  09/24/2013   CLINICAL DATA:  63 year old male with bacteremia. Query spinal infection.  Renal insufficiency precludes use of IV contrast at this time.  EXAM: MRI THORACIC AND LUMBAR SPINE WITHOUT CONTRAST  TECHNIQUE: Multiplanar and multiecho pulse sequences of the thoracic and lumbar spine were obtained without intravenous contrast.  COMPARISON:  Portable chest radiograph 09/22/2013 and earlier. Lumbar radiographs 09/20/2013. CTA pelvis 09/23/2008.  FINDINGS: MR THORACIC SPINE FINDINGS  Limited sagittal imaging of the cervical spine is remarkable for widespread chronic disc and endplate degeneration.  Preserved thoracic vertebral height and alignment. No marrow edema or evidence of acute osseous abnormality. No abnormal prevertebral or paraspinal fluid.  Epidural lipomatosis maximal at the T5-T6 level partially effaces CSF from the thecal sac. Occasional small thoracic disc protrusions (T7-T8). Intermittent thoracic facet hypertrophy (moderate at T7-T8 -T10-T11). Borderline to mild thoracic spinal stenosis at T7-T8.  Spinal cord signal is within normal limits at all visualized levels. Conus medullaris demonstrated on the lumbar images.  Posterior paraspinal soft tissues are within normal limits. Grossly negative visualized thoracic viscera.  MR LUMBAR SPINE FINDINGS  Normal lumbar segmentation, congruent with the thoracic spine imaging. Stable lumbar vertebral height and alignment. Minimal inferior endplate marrow edema at T12 appears to be degenerative in nature. No acute osseous abnormality identified.  There is widespread mild increased STIR signal in the posterior paraspinal muscles (erector spinae muscles bilaterally).  This is nonspecific. No paraspinal fluid collection.  Negative visualized abdominal viscera except for irregularity the aorta and evidence of distal aortic bypass.  Visualized lower thoracic spinal cord is normal with conus medularis at L1. Normal cauda equina nerve roots. Normal lumbar epidural space.  Lumbar disc degeneration L3-L4 through L5-S1. No spinal stenosis. Mild to moderate lower lumbar facet hypertrophy with trace facet joint fluid.  IMPRESSION: MR THORACIC SPINE IMPRESSION  1. No evidence of thoracic spine infection. 2. Mild multifactorial degenerative spinal stenosis at T7-T8.  MR LUMBAR SPINE IMPRESSION  1. Abnormal bilateral erector spinae muscle signal, nonspecific and can be commonly seen in debilitated patient's. No strong evidence of infectious myositis at this time. No evidence of lumbar spinal infection. 2. Lower lumbar spine degeneration without lumbar spinal stenosis. 3. Chronic abdominal aortic disease status post aortic bypass.   Electronically Signed   By: Lars Pinks M.D.   On: 09/24/2013 21:45   Dg Chest Port 1 View  09/22/2013   CLINICAL DATA:  Shortness of breath, fever, evaluate for pneumonia  EXAM: PORTABLE CHEST - 1 VIEW  COMPARISON:  09/19/2013; 11/11/2012; 10/11/2012  FINDINGS: Grossly unchanged enlarged cardiac silhouette and mediastinal contours with atherosclerotic plaque within the thoracic aorta. Post median sternotomy CABG. The pulmonary vasculature remains indistinct with cephalization of flow. Kerley B-lines are identified within the peripheral aspect of the left mid and lower lung. No new focal airspace opacities. No definite pleural effusion pneumothorax. Unchanged bones.  IMPRESSION: Grossly unchanged findings most suggestive of pulmonary edema, though note, atypical infection could have a similar appearance. Clinical correlation is advised. Further evaluation with a PA and lateral chest radiograph may be obtained as clinically indicated.   Electronically Signed   By:  Sandi Mariscal M.D.   On: 09/22/2013 11:29   Dg Chest Port 1 View  09/19/2013   CLINICAL DATA:  Worsening shortness of breath, bilateral lower extremity swelling.  EXAM: PORTABLE CHEST - 1 VIEW  COMPARISON:  Chest radiograph November 11, 2012.  FINDINGS: Cardiac silhouette remains mildly enlarged mediastinal silhouette is nonsuspicious, status post median sternotomy. Mildly calcified aortic knob. Mild central pulmonary vasculature congestion and mild interstitial prominence, increased. Strandy densities in left lung base unchanged may reflect scar. No pleural effusions. No pneumothorax.  Multiple EKG lines overlie the patient and may obscure subtle underlying pathology. Soft tissue planes and included osseous structures are nonsuspicious.  IMPRESSION: Stable cardiomegaly with increasing central pulmonary vasculature congestion/ interstitial prominence concerning for pulmonary edema without pleural effusion or focal consolidation.   Electronically Signed   By: Elon Alas   On: 09/19/2013 20:10    Echocardiogram  Study Conclusions - Left ventricle: The cavity size was normal. Wall thickness was increased in a pattern of mild LVH. Systolic function was normal. The estimated ejection fraction was in the range of 50% to 55%. - Left atrium: The atrium was severely dilated. 56 mm. - Right ventricle: The cavity size was mildly dilated. Systolic function was mildly reduced. - Right atrium: The atrium was severely dilated.  12-lead ECG on admission - atrial fibrillation with V rate 111 bpm 12-lead ECG post DCCV 09/25/2013 - SR 12-lead ECG today - pending Telemetry reviewed - atrial fibrillation with controlled V rate currently  Assessment and Plan Persistent atrial fibrillation / atypical atrial flutter Acute on chronic diastolic HF Listeria bacteremia CAD Chronic kidney disease Alcohol abuse  Mr. Degollado has a complex medical history and has been noncompliant with medications and follow-up. He  presented several days ago with acute on chronic diastolic HF and rapid atrial fibrillation. He has been diuresed and his volume status is improved. He has restarted Eliquis for stroke prevention. He has been continued amiodarone with plans for repeat DCCV. However, this has been canceled in favor of EP evaluation for recommendations regarding treatment options. He has undergone DCCV x 2 since Nov 2014 with reversion back to AFib within a few days, while on amiodarone. His  LA dimension is 56 mm and he continues to drink alcohol regularly. It is unlikely that he will maintain SR. Treatment strategies for atrial fibrillation were reviewed in detail. At this time, rate control strategy is best option for him. Stop amiodarone. Continue Coreg. Continue chronic anticoagulation for stroke prevention. He was counseled regarding the importance of medication compliance, follow-up and avoidance of alcohol.     Darrick Huntsman, PA-C 09/29/2013, 12:04 PM  EP Attending Patient seen and examined. Agree with above. The patient has difficult to control atrial fibrillation and has failed DCCV despite high dose amiodarone. With a LA dimension of 56, rate control is his best option. With his history of recent listeria bacteremia, he is not a candidate for AV node ablation and PPM. He will need to add digoxin, followed by backing off of his amiodarone as time goes on. He might ultimately benefit from AV node ablation and BiV PPM if his rate cannot be controlled. With his recent infection, he would not be a candidate for this therapy.  Bostyn Kunkler,M.D.  Mikle Bosworth.D

## 2013-09-29 NOTE — Progress Notes (Signed)
Patient Name: LANE ELAND Date of Encounter: 09/29/2013     Principal Problem:   Acute decompensated heart failure Active Problems:   Atrial fibrillation with RVR   Fever of unknown origin (FUO)   COPD (chronic obstructive pulmonary disease)   Acute respiratory distress   Listeria sepsis   Delirium   Alcohol abuse    SUBJECTIVE  Feeling back to his baseline. He is unaware of his afib.   CURRENT MEDS . amiodarone  200 mg Oral BID  . ampicillin (OMNIPEN) IV  2 g Intravenous 4 times per day  . apixaban  5 mg Oral BID  . atorvastatin  20 mg Oral q1800  . carvedilol  25 mg Oral BID WC  . folic acid  1 mg Oral Daily  . furosemide  40 mg Oral Daily  . lidocaine  1 patch Transdermal Q24H  . multivitamin with minerals  1 tablet Oral Daily  . potassium chloride SA  10 mEq Oral Daily  . predniSONE  40 mg Oral Q breakfast  . sodium chloride  3 mL Intravenous Q12H  . thiamine  100 mg Oral Daily    OBJECTIVE  Filed Vitals:   09/28/13 0430 09/28/13 1450 09/28/13 2120 09/29/13 0451  BP: 114/68 107/96 128/72 113/60  Pulse: 103 60 110 78  Temp: 98 F (36.7 C) 98.2 F (36.8 C) 98 F (36.7 C) 96.9 F (36.1 C)  TempSrc: Oral Oral Oral Oral  Resp: 18 17 18 18   Height:      Weight: 224 lb 12.8 oz (101.969 kg)   227 lb 1.6 oz (103.012 kg)  SpO2: 97% 99% 98% 95%    Intake/Output Summary (Last 24 hours) at 09/29/13 0844 Last data filed at 09/28/13 2120  Gross per 24 hour  Intake    840 ml  Output    575 ml  Net    265 ml   Filed Weights   09/27/13 0356 09/28/13 0430 09/29/13 0451  Weight: 221 lb 12.8 oz (100.608 kg) 224 lb 12.8 oz (101.969 kg) 227 lb 1.6 oz (103.012 kg)    PHYSICAL EXAM  General: Pleasant, NAD. Neuro: Alert and oriented X 3. Moves all extremities spontaneously. Psych: Normal affect. HEENT:  Normal  Neck: Supple without bruits or JVD. Lungs:  Resp regular and unlabored, CTA. Expiratory wheezes Heart: irreg irreg Abdomen: Soft, non-tender,  non-distended, BS + x 4.  Extremities: No clubbing, cyanosis. DP/PT/Radials 2+ and equal bilaterally. 1+ pitting edema bilat. With compression stockings  Accessory Clinical Findings  CBC  Recent Labs  09/28/13 0310 09/29/13 0443  WBC 7.2 7.4  HGB 10.6* 10.7*  HCT 36.2* 37.0*  MCV 90.0 91.6  PLT 219 937   Basic Metabolic Panel  Recent Labs  09/28/13 0310 09/29/13 0443  NA 141 142  K 4.9 4.1  CL 97 98  CO2 38* 38*  GLUCOSE 140* 109*  BUN 38* 43*  CREATININE 1.42* 1.35  CALCIUM 8.6 8.5   Liver Function Tests  Recent Labs  09/27/13 0345  AST 89*  ALT 85*  ALKPHOS 48  BILITOT 0.4  PROT 7.1  ALBUMIN 2.6*    TELE  afib with CVR  Radiology/Studies  Dg Chest 2 View  09/26/2013   CLINICAL DATA:  Cough, fever, and shortness of breath.  Hypoxia.  EXAM: CHEST  2 VIEW  COMPARISON:  09/2013  FINDINGS: Sequelae of prior CABG, left atrial clipping, and aortic valve replacement are again identified. Cardiac silhouette remains mildly enlarged. Thoracic aortic calcification  is noted. Pulmonary vascular congestion on the prior study has improved. Peripheral linear densities in the left mid to lower lung suggestive of Kerley B-lines are stable to slightly improved. Mildly increased interstitial densities in the right mid to lower lung are stable to slightly improved. More prominent linear opacity in the basilar left lower lobe may reflect subsegmental atelectasis. There is no evidence of new, confluent airspace opacity, pleural effusion, or pneumothorax. No acute osseous abnormality is identified.  IMPRESSION: Overall mildly improved appearance of the chest with scattered, residual interstitial opacities most suggestive of mild edema. No confluent airspace opacity.  Dg Lumbar Spine 2-3 Views  09/20/2013   CLINICAL DATA:  Lower lumbar pain  EXAM: LUMBAR SPINE - 2-3 VIEW  COMPARISON:  None.  FINDINGS: There are 5 nonrib bearing lumbar-type vertebral bodies. The vertebral body heights  are maintained. The alignment is anatomic. There is no spondylolysis. There is no acute fracture or static listhesis. Degenerative disc disease most significant at L3-4, L4-5 and L5-S1.  The SI joints are unremarkable.  Abdominal aortic atherosclerosis.  Left iliac stent noted.  IMPRESSION: Lumbar spine spondylosis at L3-4, L4-5 and L5-S1. No acute osseous injury of the lumbar spine.      Mr Lumbar Spine Wo Contrast  09/24/2013   CLINICAL DATA:  63 year old male with bacteremia. Query spinal infection.  Renal insufficiency precludes use of IV contrast at this time.  EXAM: MRI THORACIC AND LUMBAR SPINE WITHOUT CONTRAST  TECHNIQUE: Multiplanar and multiecho pulse sequences of the thoracic and lumbar spine were obtained without intravenous contrast.  COMPARISON:  Portable chest radiograph 09/22/2013 and earlier. Lumbar radiographs 09/20/2013. CTA pelvis 09/23/2008.  FINDINGS: MR THORACIC SPINE FINDINGS  Limited sagittal imaging of the cervical spine is remarkable for widespread chronic disc and endplate degeneration.  Preserved thoracic vertebral height and alignment. No marrow edema or evidence of acute osseous abnormality. No abnormal prevertebral or paraspinal fluid.  Epidural lipomatosis maximal at the T5-T6 level partially effaces CSF from the thecal sac. Occasional small thoracic disc protrusions (T7-T8). Intermittent thoracic facet hypertrophy (moderate at T7-T8 -T10-T11). Borderline to mild thoracic spinal stenosis at T7-T8.  Spinal cord signal is within normal limits at all visualized levels. Conus medullaris demonstrated on the lumbar images.  Posterior paraspinal soft tissues are within normal limits. Grossly negative visualized thoracic viscera.  MR LUMBAR SPINE FINDINGS  Normal lumbar segmentation, congruent with the thoracic spine imaging. Stable lumbar vertebral height and alignment. Minimal inferior endplate marrow edema at T12 appears to be degenerative in nature. No acute osseous abnormality  identified.  There is widespread mild increased STIR signal in the posterior paraspinal muscles (erector spinae muscles bilaterally). This is nonspecific. No paraspinal fluid collection.  Negative visualized abdominal viscera except for irregularity the aorta and evidence of distal aortic bypass.  Visualized lower thoracic spinal cord is normal with conus medularis at L1. Normal cauda equina nerve roots. Normal lumbar epidural space.  Lumbar disc degeneration L3-L4 through L5-S1. No spinal stenosis. Mild to moderate lower lumbar facet hypertrophy with trace facet joint fluid.  IMPRESSION: MR THORACIC SPINE IMPRESSION  1. No evidence of thoracic spine infection. 2. Mild multifactorial degenerative spinal stenosis at T7-T8.  MR LUMBAR SPINE IMPRESSION  1. Abnormal bilateral erector spinae muscle signal, nonspecific and can be commonly seen in debilitated patient's. No strong evidence of infectious myositis at this time. No evidence of lumbar spinal infection. 2. Lower lumbar spine degeneration without lumbar spinal stenosis. 3. Chronic abdominal aortic disease status post aortic bypass.  Dg Chest Port 1 View  09/22/2013   CLINICAL DATA:  Shortness of breath, fever, evaluate for pneumonia  EXAM: PORTABLE CHEST - 1 VIEW  COMPARISON:  09/19/2013; 11/11/2012; 10/11/2012  FINDINGS: Grossly unchanged enlarged cardiac silhouette and mediastinal contours with atherosclerotic plaque within the thoracic aorta. Post median sternotomy CABG. The pulmonary vasculature remains indistinct with cephalization of flow. Kerley B-lines are identified within the peripheral aspect of the left mid and lower lung. No new focal airspace opacities. No definite pleural effusion pneumothorax. Unchanged bones.  IMPRESSION: Grossly unchanged findings most suggestive of pulmonary edema, though note, atypical infection could have a similar appearance. Clinical correlation is advised. Further evaluation with a PA and lateral chest radiograph may  be obtained as clinically indicated.     Dg Chest Port 1 View  09/19/2013   CLINICAL DATA:  Worsening shortness of breath, bilateral lower extremity swelling.  EXAM: PORTABLE CHEST - 1 VIEW  COMPARISON:  Chest radiograph November 11, 2012.  FINDINGS: Cardiac silhouette remains mildly enlarged mediastinal silhouette is nonsuspicious, status post median sternotomy. Mildly calcified aortic knob. Mild central pulmonary vasculature congestion and mild interstitial prominence, increased. Strandy densities in left lung base unchanged may reflect scar. No pleural effusions. No pneumothorax.  Multiple EKG lines overlie the patient and may obscure subtle underlying pathology. Soft tissue planes and included osseous structures are nonsuspicious.  IMPRESSION: Stable cardiomegaly with increasing central pulmonary vasculature congestion/ interstitial prominence concerning for pulmonary edema without pleural effusion or focal consolidation.      ASSESSMENT AND PLAN 63 year old male with AS s/p AVR/CABG(SVG to PDA and PL) /MAZE(10/08/12), pAF on amiodarone and COPD who was admitted on 09/19/13 with volume overload, fever, and with Listeria blood CX.   Acute decompensated diastolic heart failure -- 2D echo on 09/20/13 demonstrated low normal systolic function, with an EF of 50-55% and Grade II DD. ECHO 30-35% 08/2012  -- Improved after treatment with high dose lasix and metolazone. Continue lasix maintenance 40mg  QD.  -- Neg 8.2L. Wt 240 on admit-->now 227.  -- On admit creat was 1.4. Currently 1.35.  Atrial fibrillation/flutter with RVR -- S/p TEE/DCCV 09/25/13 with returns of atrial fibrillation with CVR  -- Continue amiodarone 400mg  BID and eliquis 5mg  BID -- Digoxin discontinued with with worsening renal function.  -- LFTs were elevated but stable on recheck yesterday and he is on amio - will need to follow closely (most likely has fatty liver and ETOH liver disease)   -- He is currently NPO for possible DCCV this AM.  No orders put in. Will consult rounding MD.   AS- s/p pericardial tissue valve replacement. Valve appears stable by most recent echo -- Valve area (Vmax): 1.47 cm^2. Indexed valve area (Vmax): 0.64 cm^2/m^2. Peak gradient (S): 17 mm Hg.  COPD- hypercarbia chronic. -- No signs of exacerbation. Continue O2 via nasal cannula and albuterol nebs  -- add atrovent neb whole here- PFTs in 2014 showed severe emphysema- was given Breo with as needed albuterol- resume this at d/c. On prednisone.  -- PA and lateral x ray- show some fluid still, no PNA  -- Will need O2 at home   Gram + rod bacteremia (per IM and ID)  -- Listeria- repeat blood cultures for clearing  -- MRI spine: MR THORACIC SPINE IMPRESSION  1. No evidence of thoracic spine infection.  2. Mild multifactorial degenerative spinal stenosis at T7-T8.  MR LUMBAR SPINE IMPRESSION  1. Abnormal bilateral erector spinae muscle signal, nonspecific and  can be  commonly seen in debilitated patient's. No strong evidence of  infectious myositis at this time. No evidence of lumbar spinal  infection.  2. Lower lumbar spine degeneration without lumbar spinal stenosis.  3. Chronic abdominal aortic disease status post aortic bypass.  -abx per ID  -- TEE ok  -- Per ID- continue on ampicillin 2gm IV Q6hr. Since TEE and MRI is negative, will treat for 2 wk, using 6/3 as day 1 of 14 of antibiotics. For discharge can transition him to oral bactrim since he will likely have difficulty with picc line but most importantly we have eliminated disseminated disease. Can switch to bactrim DS 2 tabs Q 8hr. Recommend to check bmp twice a week x 1 wk to ensure cr is not significantly elevated.   Back pain  -- Pain better  -- Neurosurgery referral for spinal stenosis   Alcohol abuse  -- CIWA- past symptoms for withdrawal   Deconditioning  -- PT eval   Chronic Left leg cellulitis and peripheral vascular disease  -- No signs of acute infection and as per pt the  leg appears much better now. Dressing as per wound care consult  --Duplex negative for DVT   Signed, Perry Mount PA-C  Pager [phone]

## 2013-09-29 NOTE — Progress Notes (Signed)
Physical Therapy Treatment Patient Details Name: [name] MRN: [id number] DOB: 1950/06/04 Today's Date: 09/29/2013    History of Present Illness Pt admit with CHF and afib and now with fever of unknown origin.  PMH- CABG, COPD    PT Comments    Pt admitted with above. Pt currently with functional limitations due to balance and endurance deficits. Pt will benefit from skilled PT to increase their independence and safety with mobility to allow discharge to the venue listed below.   Follow Up Recommendations  Home health PT;Supervision - Intermittent     Equipment Recommendations  None recommended by PT    Recommendations for Other Services       Precautions / Restrictions Precautions Precautions: Fall Restrictions Weight Bearing Restrictions: No    Mobility  Bed Mobility Overal bed mobility: Modified Independent                Transfers Overall transfer level: Modified independent Equipment used: Rolling walker (2 wheeled)             General transfer comment: Pt able to transition to full standing position without assistance.   Ambulation/Gait Ambulation/Gait assistance: Supervision Ambulation Distance (Feet): 175 Feet Assistive device: Rolling walker (2 wheeled) Gait Pattern/deviations: Step-through pattern;Decreased stride length;Wide base of support   Gait velocity interpretation: Below normal speed for age/gender General Gait Details: Pt able to ambulate ~90 feet before O2 sats decreased from 94% to 85% on RA.  . Supplemental O2 was increased to 3L/min and pt was able to improve O2 sats with pursed-lip breathing to 90-94%%. No LOB with RW.  Some cues for safety with turns.    Stairs            Wheelchair Mobility    Modified Rankin (Stroke Patients Only)       Balance Overall balance assessment: Needs assistance Sitting-balance support: No upper extremity supported;Feet supported Sitting balance-Leahy Scale: Good     Standing  balance support: Bilateral upper extremity supported;During functional activity Standing balance-Leahy Scale: Fair Standing balance comment: Continues to not withstand challenges to balance when he doesnt' have UE support.                     Cognition Arousal/Alertness: Awake/alert Behavior During Therapy: WFL for tasks assessed/performed Overall Cognitive Status: Within Functional Limits for tasks assessed                      Exercises General Exercises - Lower Extremity Ankle Circles/Pumps: AROM;Both;10 reps;Seated Long Arc Quad: AROM;Both;10 reps;Seated    General Comments General comments (skin integrity, edema, etc.): Back pain limits his mobility.       Pertinent Vitals/Pain Desat on RA to 85%.  O2 on 3L >90%.  No pain    Home Living                      Prior Function            PT Goals (current goals can now be found in the care plan section) Progress towards PT goals: Progressing toward goals    Frequency  Min 3X/week    PT Plan Current plan remains appropriate    Co-evaluation             End of Session Equipment Utilized During Treatment: Gait belt;Oxygen Activity Tolerance: Patient limited by fatigue Patient left: in bed;with call bell/phone within reach     Time: 3267-1245 PT Time Calculation (min):  14 min  Charges:  $Gait Training: 8-22 mins                    G Codes:      Sagan Maselli Dorene Ar 14-Oct-2013, 1:07 PM [facility] [phone] (pager)

## 2013-09-29 NOTE — Progress Notes (Signed)
Clinical Social Work Department BRIEF PSYCHOSOCIAL ASSESSMENT 09/29/2013  Patient:  [name], [name]     Account Number:  [id number]     Admit date:  09/19/2013  Clinical Social Worker:  Megan Salon  Date/Time:  09/29/2013 11:25 AM  Referred by:  Physician  Date Referred:  09/29/2013 Referred for  Substance Abuse   Other Referral:   Interview type:  Patient Other interview type:    PSYCHOSOCIAL DATA Living Status:  FAMILY Admitted from facility:   Level of care:   Primary support name:  Gwynneth Aliment Primary support relationship to patient:  CHILD, ADULT Degree of support available:   Good    CURRENT CONCERNS Current Concerns  Substance Abuse   Other Concerns:    SOCIAL WORK ASSESSMENT / PLAN CSW met with patient regarding substance use. CSW introduced self and explained reason for visit. Patient states he has already spoken to the doctor about substance use extensively and at this time states he does not need to speak with a Education officer, museum. CSW offered outpatient substance user esources to patient and patient stated he will take the handout and look at it. Patient thanked Education officer, museum for support.   Assessment/plan status:  Information/Referral to [employer] Other assessment/ plan:   Information/referral to community resources:   Substance Use List    PATIENT'S/FAMILY'S RESPONSE TO PLAN OF CARE: Patient states he knows how to quit and has spoken to MD about quitting and states he does not need to speak with a Education officer, museum about substance abuse. Patient states there are no other social work needs at this time. CSW signing off at this time. Please re consult if social work needs arise.        Jeanette Caprice, MSW, [place]

## 2013-09-29 NOTE — Progress Notes (Signed)
I've reviewed records and this is a complicated situation. He still drinks daily. He has had cardioversion once this admission and did not hold NSR. Also had failed cardioversion 02/2013. Plan to cancel cardioversion for today. Get EP consult to determine if any role for ablation. If not, then we will attempt rate control minus long term amiodarone if possible. If no further AF therapy probably home in AM.

## 2013-09-29 NOTE — Progress Notes (Signed)
PROGRESS NOTE  [name] ZOX:[id number] DOB: Feb 07, 1951 DOA: 09/19/2013 PCP: No PCP Per Patient  63 y.o. male with past medical history of severe aortic stenosis s/p tissue AV replacement, parox atrial fibrillation, heart failure, hypertension, and coronary artery disease who presents with shortness of breath that has progressed over the last few months now with severe DOE. + orthopnea in addition to abdominal distention and lower leg swelling. Also atrial fib with RVR. He reports intermittent weeping of wounds of lower extremity due to le edema. on admit, he was found to be hypoxic on room air and put on 2 L nasal cannula. CXR showing pulmonary edema. He was admitted for heart failure exacerbation. He was afebrile on admit however has developed high fevers of admit up to 102.3. ID and IM consulted. In addition, he had 2 sets of blood cx showing gpr +LISTERIA   Assessment/Plan: Gram + rod bacteremia - listeria- repeat blood cultures for clearing -MRI spine: MR THORACIC SPINE IMPRESSION  1. No evidence of thoracic spine infection.  2. Mild multifactorial degenerative spinal stenosis at T7-T8.  MR LUMBAR SPINE IMPRESSION  1. Abnormal bilateral erector spinae muscle signal, nonspecific and  can be commonly seen in debilitated patient's. No strong evidence of  infectious myositis at this time. No evidence of lumbar spinal  infection.  2. Lower lumbar spine degeneration without lumbar spinal stenosis.  3. Chronic abdominal aortic disease status post aortic bypass. -abx per ID -TEE ok - per ID- continue on ampicillin 2gm IV Q6hr. Since TEE and MRI is negative, will treat for 2 wk, using 6/3 as day 1 of 14 of antibiotics. For discharge can transition him to oral bactrim since he will likely have difficulty with picc line but most importantly we have eliminated disseminated disease. Can switch to bactrim DS 2 tabs Q 8hr. Recommend to check bmp twice a week x 1 wk to ensure cr is not  significantly elevated.   Back pain -MRI- see above- pain in better Neurosurgery referral for spinal stenosis as an outpatient -could add lidocaine patch if continues  Alcohol abuse -CIWA- past symptoms for withdrawal  Atrial fibrillation with RVR  Currently rate controlled on amiodarone, carvedilol. His CHADS2 score is 2.   Acute decompensated heart failure  Lasix  On aspirin   Has negative balance.   COPD (chronic obstructive pulmonary disease)  No signs of exacerbation. Continue O2 via nasal cannula and albuterol nebs  - add atrovent neb whole here- PFTs in 2014 showed severe emphysema- was given Breo with as needed albuterol- resume this at d/c - PA and lateral x ray- show some fluid still, no PNA  Acute resp failure-  Will need O2 at home  CAD  On aspirin, statin and carvedilol   Deconditioning  PT eval - home health?  Chronic Left leg cellulitis and peripheral vascular disease  No signs of acute infection and as per pt the leg appears much better now. Dressing as per wound care consult  Duplex negative for DVT  Acute kidney injury  Has good urine output. Continue to monitor   Code Status: full Family Communication: patient/daughter on phone yest Disposition Plan: spoke at length with patient about quitting drinking   HPI/Subjective: No overnight events Plan is for cardioversion this AM    Objective: Filed Vitals:   09/29/13 0451  BP: 113/60  Pulse: 78  Temp: 96.9 F (36.1 C)  Resp: 18    Intake/Output Summary (Last 24 hours) at 09/29/13 1052 Last data filed  at 09/28/13 2120  Gross per 24 hour  Intake    480 ml  Output    575 ml  Net    -95 ml   Filed Weights   09/27/13 0356 09/28/13 0430 09/29/13 0451  Weight: 100.608 kg (221 lb 12.8 oz) 101.969 kg (224 lb 12.8 oz) 103.012 kg (227 lb 1.6 oz)    Exam:   General:  Pleasant/cooperative- A+Ox3, NAD  Cardiovascular: irr  Respiratory: decrease breath sounds- air moving better on left  lung- b/l expiratory wheezing  Abdomen: +Bs, soft  Musculoskeletal: moves all 4 ext   Data Reviewed: Basic Metabolic Panel:  Recent Labs Lab 09/25/13 2045 09/26/13 0358 09/27/13 0345 09/28/13 0310 09/29/13 0443  NA 139 140 140 141 142  K 3.9 4.0 3.7 4.9 4.1  CL 92* 93* 95* 97 98  CO2 43* 44* 39* 38* 38*  GLUCOSE 125* 100* 106* 140* 109*  BUN 41* 39* 39* 38* 43*  CREATININE 1.50* 1.48* 1.35 1.42* 1.35  CALCIUM 8.5 8.8 8.5 8.6 8.5   Liver Function Tests:  Recent Labs Lab 09/25/13 2045 09/27/13 0345  AST 101* 89*  ALT 87* 85*  ALKPHOS 49 48  BILITOT 0.4 0.4  PROT 7.2 7.1  ALBUMIN 2.6* 2.6*   No results found for this basename: LIPASE, AMYLASE,  in the last 168 hours  Recent Labs Lab 09/25/13 2045  AMMONIA 25   CBC:  Recent Labs Lab 09/25/13 0352 09/26/13 0358 09/27/13 0345 09/28/13 0310 09/29/13 0443  WBC 5.0 5.2 5.9 7.2 7.4  HGB 11.2* 10.7* 10.5* 10.6* 10.7*  HCT 36.6* 35.6* 35.2* 36.2* 37.0*  MCV 89.5 90.4 89.3 90.0 91.6  PLT 172 179 191 219 216   Cardiac Enzymes: No results found for this basename: CKTOTAL, CKMB, CKMBINDEX, TROPONINI,  in the last 168 hours BNP (last 3 results)  Recent Labs  10/01/12 0415 09/19/13 1941 09/24/13 0400  PROBNP 11193.0* 3530.0* 1792.0*   CBG: No results found for this basename: GLUCAP,  in the last 168 hours  Recent Results (from the past 240 hour(s))  CULTURE, BLOOD (ROUTINE X 2)     Status: None   Collection Time    09/21/13 10:11 AM      Result Value Ref Range Status   Specimen Description BLOOD RIGHT ARM   Final   Special Requests BOTTLES DRAWN AEROBIC AND ANAEROBIC 5CC   Final   Culture  Setup Time     Final   Value: 09/21/2013 18:11     Performed at [facility]   Culture     Final   Value: LISTERIA MONOCYTOGENES     Note: Recommended therapy: ampicillin with or without an aminoglycoside. Cephalosporins are not effective. CRITICAL RESULT CALLED TO, READ BACK BY AND VERIFIED WITH: OLIVIA  STEIN 09/24/13 0800 BY SMITHERSJ FAXED TO [place] [facility] CONNIE WEANT [id number] BY      PEAKY     Note: Gram Stain Report Called to,Read Back By and Verified With: HEATHER B BY INGRAM A 09/22/13 1205PM     Performed at [facility]   Report Status 09/25/2013 FINAL   Final  CULTURE, BLOOD (ROUTINE X 2)     Status: None   Collection Time    09/21/13 10:16 AM      Result Value Ref Range Status   Specimen Description BLOOD RIGHT ARM   Final   Special Requests BOTTLES DRAWN AEROBIC AND ANAEROBIC 5CC   Final   Culture  Setup Time  Final   Value: 09/21/2013 18:11     Performed at [facility]   Culture     Final   Value: LISTERIA MONOCYTOGENES     Note: Recommended therapy: ampicillin with or without an aminoglycoside. Cephalosporins are not effective. CRITICAL RESULT CALLED TO, READ BACK BY AND VERIFIED WITH: OLIVIA STEIN 09/24/13 0800 BY SMITHERSJ FAXED TO [place] CONNIE WEANT [id number] BY      PEAKY     Note: Gram Stain Report Called to,Read Back By and Verified With: HEATHER B BY INGRAM A 09/22/13 1205PM     Performed at [facility]   Report Status 09/25/2013 FINAL   Final  URINE CULTURE     Status: None   Collection Time    09/21/13 10:24 AM      Result Value Ref Range Status   Specimen Description URINE, RANDOM   Final   Special Requests Normal   Final   Culture  Setup Time     Final   Value: 09/21/2013 18:41     Performed at [place]     Final   Value: 25,000 COLONIES/ML     Performed at [facility]   Culture     Final   Value: Multiple bacterial morphotypes present, none predominant. Suggest appropriate recollection if clinically indicated.     Performed at [facility]   Report Status 09/22/2013 FINAL   Final  CULTURE, BLOOD (ROUTINE X 2)     Status: None   Collection Time    09/22/13  1:56 PM      Result Value Ref Range Status   Specimen Description BLOOD LEFT HAND   Final   Special Requests     Final    Value: BOTTLES DRAWN AEROBIC AND ANAEROBIC 10CC BLUE  8CC RED   Culture  Setup Time     Final   Value: 09/22/2013 18:23     Performed at [facility]   Culture     Final   Value: LISTERIA MONOCYTOGENES     Note: Recommended therapy: ampicillin with or without an aminoglycoside. Cephalosporins are not effective.     Note: Gram Stain Report Called to,Read Back By and Verified With: HEATHER B@[id number]  ON [id number] BY [facility]     Performed at [facility]   Report Status 09/26/2013 FINAL   Final  CULTURE, BLOOD (ROUTINE X 2)     Status: None   Collection Time    09/22/13  2:02 PM      Result Value Ref Range Status   Specimen Description BLOOD RIGHT HAND   Final   Special Requests BOTTLES DRAWN AEROBIC ONLY 10CC   Final   Culture  Setup Time     Final   Value: 09/22/2013 18:23     Performed at [facility]   Culture     Final   Value: LISTERIA MONOCYTOGENES     Note: Recommended therapy: ampicillin with or without an aminoglycoside. Cephalosporins are not effective.     Note: Gram Stain Report Called to,Read Back By and Verified With: BREANA DAVIS ON 09/23/2013 AT 7:21P BY Dennard Nip     Performed at [facility]   Report Status 09/26/2013 FINAL   Final  CULTURE, BLOOD (ROUTINE X 2)     Status: None   Collection Time    09/26/13 10:09 AM      Result Value Ref Range Status   Specimen Description BLOOD RIGHT  HAND   Final   Special Requests BOTTLES DRAWN AEROBIC ONLY 10CC   Final   Culture  Setup Time     Final   Value: 09/27/2013 06:20     Performed at [facility]   Culture     Final   Value:        BLOOD CULTURE RECEIVED NO GROWTH TO DATE CULTURE WILL BE HELD FOR 5 DAYS BEFORE ISSUING A FINAL NEGATIVE REPORT     Performed at [facility]   Report Status PENDING   Incomplete     Studies: No results found.  Scheduled Meds: . amiodarone  200 mg Oral BID  . ampicillin (OMNIPEN) IV  2 g Intravenous 4 times per day  . apixaban  5 mg Oral BID  .  atorvastatin  20 mg Oral q1800  . carvedilol  25 mg Oral BID WC  . folic acid  1 mg Oral Daily  . furosemide  40 mg Oral Daily  . lidocaine  1 patch Transdermal Q24H  . multivitamin with minerals  1 tablet Oral Daily  . potassium chloride SA  10 mEq Oral Daily  . predniSONE  40 mg Oral Q breakfast  . sodium chloride  3 mL Intravenous Q12H  . thiamine  100 mg Oral Daily   Continuous Infusions:   Antibiotics Given (last 72 hours)   Date/Time Action Medication Dose Rate   09/26/13 1222 Given   ampicillin (OMNIPEN) 2 g in sodium chloride 0.9 % 50 mL IVPB 2 g 150 mL/hr   09/26/13 1850 Given   ampicillin (OMNIPEN) 2 g in sodium chloride 0.9 % 50 mL IVPB 2 g 150 mL/hr   09/27/13 0015 Given   ampicillin (OMNIPEN) 2 g in sodium chloride 0.9 % 50 mL IVPB 2 g 150 mL/hr   09/27/13 0655 Given   ampicillin (OMNIPEN) 2 g in sodium chloride 0.9 % 50 mL IVPB 2 g 150 mL/hr   09/27/13 1241 Given   ampicillin (OMNIPEN) 2 g in sodium chloride 0.9 % 50 mL IVPB 2 g 150 mL/hr   09/27/13 1706 Given   ampicillin (OMNIPEN) 2 g in sodium chloride 0.9 % 50 mL IVPB 2 g 150 mL/hr   09/27/13 2300 Given   ampicillin (OMNIPEN) 2 g in sodium chloride 0.9 % 50 mL IVPB 2 g 150 mL/hr   09/28/13 0654 Given   ampicillin (OMNIPEN) 2 g in sodium chloride 0.9 % 50 mL IVPB 2 g 150 mL/hr   09/28/13 1321 Given   ampicillin (OMNIPEN) 2 g in sodium chloride 0.9 % 50 mL IVPB 2 g 150 mL/hr   09/28/13 1743 Given   ampicillin (OMNIPEN) 2 g in sodium chloride 0.9 % 50 mL IVPB 2 g 150 mL/hr   09/28/13 2316 Given   ampicillin (OMNIPEN) 2 g in sodium chloride 0.9 % 50 mL IVPB 2 g 150 mL/hr      Principal Problem:   Acute decompensated heart failure Active Problems:   Atrial fibrillation with RVR   Fever of unknown origin (FUO)   COPD (chronic obstructive pulmonary disease)   Acute respiratory distress   Listeria sepsis   Delirium   Alcohol abuse    Time spent: 25 min    [facility] Pager  [phone]. If 7PM-7AM, please contact night-coverage at [email or web address], password [facility] 09/29/2013, 10:52 AM  LOS: 10 days

## 2013-09-30 ENCOUNTER — Encounter (HOSPITAL_COMMUNITY): Payer: Self-pay | Admitting: Cardiology

## 2013-09-30 DIAGNOSIS — I350 Nonrheumatic aortic (valve) stenosis: Secondary | ICD-10-CM

## 2013-09-30 DIAGNOSIS — M549 Dorsalgia, unspecified: Secondary | ICD-10-CM

## 2013-09-30 HISTORY — DX: Dorsalgia, unspecified: M54.9

## 2013-09-30 HISTORY — DX: Nonrheumatic aortic (valve) stenosis: I35.0

## 2013-09-30 LAB — BASIC METABOLIC PANEL
BUN: 43 mg/dL — ABNORMAL HIGH (ref 6–23)
CHLORIDE: 97 meq/L (ref 96–112)
CO2: 35 meq/L — AB (ref 19–32)
Calcium: 8.2 mg/dL — ABNORMAL LOW (ref 8.4–10.5)
Creatinine, Ser: 1.26 mg/dL (ref 0.50–1.35)
GFR calc Af Amer: 68 mL/min — ABNORMAL LOW (ref >90)
GFR calc non Af Amer: 59 mL/min — ABNORMAL LOW (ref >90)
GLUCOSE: 104 mg/dL — AB (ref 70–99)
Potassium: 4.1 mEq/L (ref 3.7–5.3)
SODIUM: 141 meq/L (ref 137–147)

## 2013-09-30 LAB — CBC
HCT: 37.5 % — ABNORMAL LOW (ref 39.0–52.0)
HEMOGLOBIN: 11 g/dL — AB (ref 13.0–17.0)
MCH: 26.9 pg (ref 26.0–34.0)
MCHC: 29.3 g/dL — ABNORMAL LOW (ref 30.0–36.0)
MCV: 91.7 fL (ref 78.0–100.0)
PLATELETS: 241 10*3/uL (ref 150–400)
RBC: 4.09 MIL/uL — AB (ref 4.22–5.81)
RDW: 17.6 % — ABNORMAL HIGH (ref 11.5–15.5)
WBC: 6.8 10*3/uL (ref 4.0–10.5)

## 2013-09-30 MED ORDER — SULFAMETHOXAZOLE-TMP DS 800-160 MG PO TABS: 2.0000 | ORAL_TABLET | Freq: Three times a day (TID) | ORAL | Status: DC

## 2013-09-30 MED ORDER — APIXABAN 5 MG PO TABS: 5.0000 mg | ORAL_TABLET | Freq: Two times a day (BID) | ORAL | Status: DC

## 2013-09-30 MED ORDER — AMIODARONE HCL 100 MG PO TABS: 100.0000 mg | ORAL_TABLET | Freq: Two times a day (BID) | ORAL | Status: DC

## 2013-09-30 MED ORDER — FUROSEMIDE 40 MG PO TABS: 40.0000 mg | ORAL_TABLET | Freq: Every day | ORAL | Status: DC

## 2013-09-30 MED ORDER — CARVEDILOL 25 MG PO TABS: 25.0000 mg | ORAL_TABLET | Freq: Two times a day (BID) | ORAL | Status: DC

## 2013-09-30 MED ORDER — POTASSIUM CHLORIDE CRYS ER 10 MEQ PO TBCR: 10.0000 meq | EXTENDED_RELEASE_TABLET | Freq: Every day | ORAL | Status: DC

## 2013-09-30 MED ORDER — FOLIC ACID 1 MG PO TABS: 1.0000 mg | ORAL_TABLET | Freq: Every day | ORAL | Status: AC

## 2013-09-30 MED ORDER — THIAMINE HCL 100 MG PO TABS: 100.0000 mg | ORAL_TABLET | Freq: Every day | ORAL | Status: DC

## 2013-09-30 MED ORDER — DIGOXIN 250 MCG PO TABS: 0.2500 mg | ORAL_TABLET | Freq: Every day | ORAL | Status: DC

## 2013-09-30 MED ORDER — ATORVASTATIN CALCIUM 20 MG PO TABS: 20.0000 mg | ORAL_TABLET | Freq: Every day | ORAL | Status: DC

## 2013-09-30 MED ORDER — AMIODARONE HCL 100 MG PO TABS
100.0000 mg | ORAL_TABLET | Freq: Two times a day (BID) | ORAL | Status: DC
  Filled 2013-09-30: qty 1

## 2013-09-30 MED ORDER — LIDOCAINE 5 % EX PTCH: 1.0000 | MEDICATED_PATCH | CUTANEOUS | Status: DC

## 2013-09-30 MED ORDER — ADULT MULTIVITAMIN W/MINERALS CH: 1.0000 | ORAL_TABLET | Freq: Every day | ORAL | Status: DC

## 2013-09-30 MED ORDER — SULFAMETHOXAZOLE-TMP DS 800-160 MG PO TABS
2.0000 | ORAL_TABLET | Freq: Three times a day (TID) | ORAL | Status: DC
  Filled 2013-09-30 (×3): qty 2

## 2013-09-30 MED ORDER — PREDNISONE 10 MG PO TABS: 30.0000 mg | ORAL_TABLET | Freq: Every day | ORAL | Status: DC

## 2013-09-30 NOTE — Progress Notes (Signed)
Appreciate.detail his recommendations and thoughts concerning management of atrial fibrillation. No further attempts at electrical cardioversion.  Digoxin was started yesterday. I would decrease the patient's amiodarone to 100 mg per day for 5 days and then DC. This will allow him to load with digoxin 0.25 mg daily.  He should followup with Dr. Gwenlyn Found within 7-10 days with an EKG. He should call of swelling or increasing shortness of breath. He should call if also appetite or other evidence of digoxin toxicity.

## 2013-09-30 NOTE — Progress Notes (Signed)
63 y.o. male with AS s/p tissue AVR June 2014, CAD s/p single vessel CABG June 2014, atrial fibrillation s/p MAZE June 6063, chronic diastolic HF, alcohol abuse, PVD and COPD who presented 09/19/2013 with acute on chronic diastolic HF and rapid atrial fibrillation.  He reports increasing SOB and orthopnea x 3 months. He has also had increasing leg swelling. He denies CP or palpitations. He has had atrial fibrillation since July 2014. He was started on amiodarone and underwent DCCV Nov 2014 which was successful for restoring SR. He was continued on amiodarone and Eliquis. However, he stopped Eliquis in Nov 2014 due to cost. He also took Coumadin for 4-5 weeks ("because I had that leftover at home") without any monitoring. He has not been anticoagulated since that time. He has been lost to follow-up until he presented on 09/19/2013. He tells me he did not feel better while in SR. He states, "I can't tell any different."  Also while here he developed fever and blood cultures were positive for listeria. ID is following. TEE was done. No evidence of endocarditis or LA/LAA clot. He was then cardioverted to SR on 09/25/2013. He reverted back to atrial fibrillation within 36 hours. EP has been asked to see in consultation for management of atrial fibrillation. The patient has difficult to control atrial fibrillation and has failed DCCV despite high dose amiodarone. With a LA dimension of 56, rate control is his best option. With his history of recent listeria bacteremia, he is not a candidate for AV node ablation and PPM. He will need to add digoxin, followed by backing off of his amiodarone as time goes on. He might ultimately benefit from AV node ablation and BiV PPM if his rate cannot be controlled. With his recent infection, he would not be a candidate for this therapy.    Subjective:   Objective: Vital signs in last 24 hours: Temp:  [97.4 F (36.3 C)-98 F (36.7 C)] 97.4 F (36.3 C) (06/09  0427) Pulse Rate:  [72-89] 72 (06/09 1139) Resp:  [18] 18 (06/09 0427) BP: (103-137)/(63-66) 107/64 mmHg (06/09 1139) SpO2:  [93 %-99 %] 93 % (06/09 0427) Weight:  [226 lb 3.1 oz (102.6 kg)] 226 lb 3.1 oz (102.6 kg) (06/09 0427) Weight change: -14.5 oz (-0.412 kg) Last BM Date: 09/29/13 Intake/Output from previous day: +225 (total since admit -7982) wt 226 climbing for the last several days. Though admit wt 240. 06/08 0701 - 06/09 0700 In: 600 [P.O.:600] Out: 375 [Urine:375] Intake/Output this shift:    PE: General:Pleasant affect, NAD Skin:Warm and dry, brisk capillary refill HEENT:normocephalic, sclera clear, mucus membranes moist Neck:supple, no JVD, no bruits  Heart:S1S2 RRR without murmur, gallup, rub or click Lungs:clear without rales, rhonchi, or wheezes KZS:WFUX, non tender, + BS, do not palpate liver spleen or masses Ext:no lower ext edema, 2+ pedal pulses, 2+ radial pulses Neuro:alert and oriented, MAE, follows commands, + facial symmetry   Lab Results:  Recent Labs  09/29/13 0443 09/30/13 0408  WBC 7.4 6.8  HGB 10.7* 11.0*  HCT 37.0* 37.5*  PLT 216 241   BMET  Recent Labs  09/29/13 0443 09/30/13 0408  NA 142 141  K 4.1 4.1  CL 98 97  CO2 38* 35*  GLUCOSE 109* 104*  BUN 43* 43*  CREATININE 1.35 1.26  CALCIUM 8.5 8.2*   No results found for this basename: TROPONINI, CK, MB,  in the last 72 hours        Studies/Results:  No results found.  Medications: I have reviewed the patient's current medications. Scheduled Meds: . amiodarone  200 mg Oral BID  . ampicillin (OMNIPEN) IV  2 g Intravenous 4 times per day  . apixaban  5 mg Oral BID  . atorvastatin  20 mg Oral q1800  . carvedilol  25 mg Oral BID WC  . digoxin  0.25 mg Oral Daily  . folic acid  1 mg Oral Daily  . furosemide  40 mg Oral Daily  . lidocaine  1 patch Transdermal Q24H  . multivitamin with minerals  1 tablet Oral Daily  . potassium chloride SA  10 mEq Oral Daily  . predniSONE   40 mg Oral Q breakfast  . sodium chloride  3 mL Intravenous Q12H  . thiamine  100 mg Oral Daily   Continuous Infusions:  PRN Meds:.sodium chloride, acetaminophen, haloperidol, haloperidol lactate, ipratropium-albuterol, sodium chloride  Assessment/Plan: Acute decompensated diastolic heart failure  -- 2D echo on 09/20/13 demonstrated low normal systolic function, with an EF of 50-55% and Grade II DD. ECHO 30-35% 08/2012  -- Improved after treatment with high dose lasix and metolazone. Continue lasix maintenance 40mg  QD.  -- Neg 8.2L. Wt 240 on admit-->now 227.  -- On admit creat was 1.4. Currently 1.35.  Atrial fibrillation/flutter with RVR - now rate controlled -- S/p TEE/DCCV 09/25/13 with returns of atrial fibrillation with CVR  -- Continue amiodarone 400mg  BID and eliquis 5mg  BID --wean amiodarone to 200 bid in 1 week -- Digoxin resumed per Dr. Lovena Le monitor due to renal function.  -- LFTs were elevated but stable on recheck yesterday and he is on amio - will need to follow closely (most likely has fatty liver and ETOH liver disease)     AS- s/p pericardial tissue valve replacement. Valve appears stable by most recent echo  -- Valve area (Vmax): 1.47 cm^2. Indexed valve area (Vmax): 0.64 cm^2/m^2. Peak gradient (S): 17 mm Hg.  COPD- hypercarbia chronic.  Now on steroids ? taper -- No signs of exacerbation. Continue O2 via nasal cannula and albuterol nebs  -- add atrovent neb whole here- PFTs in 2014 showed severe emphysema- was given Breo with as needed albuterol- resume this at d/c. On prednisone.  -- PA and lateral x ray- show some fluid still, no PNA  -- Will need O2 at home  Gram + rod bacteremia (per IM and ID)  -- Listeria- repeat blood cultures for clearing  -- MRI spine: MR THORACIC SPINE IMPRESSION  1. No evidence of thoracic spine infection.  2. Mild multifactorial degenerative spinal stenosis at T7-T8.  MR LUMBAR SPINE IMPRESSION  1. Abnormal bilateral erector spinae muscle  signal, nonspecific and  can be commonly seen in debilitated patient's. No strong evidence of  infectious myositis at this time. No evidence of lumbar spinal  infection.  2. Lower lumbar spine degeneration without lumbar spinal stenosis.  3. Chronic abdominal aortic disease status post aortic bypass.  -abx per ID  -- TEE ok  -- Per ID- continue on ampicillin 2gm IV Q6hr. Since TEE and MRI is negative, will treat for 2 wk, using 6/3 as day 1 of 14 of antibiotics. For discharge can transition him to oral bactrim since he will likely have difficulty with picc line but most importantly we have eliminated disseminated disease. Can switch to bactrim DS 2 tabs Q 8hr. Recommend to check bmp twice a week x 1 wk to ensure cr is not significantly elevated.  Back pain  -- Pain  better  -- Neurosurgery referral for spinal stenosis  Alcohol abuse  -- CIWA- past symptoms for withdrawal  Deconditioning  -- PT eval - need home health PT--would also need HH RN he had problems taking his meds in past. Chronic Left leg cellulitis and peripheral vascular disease  -- No signs of acute infection and as per pt the leg appears much better now. Dressing as per wound care consult  --Duplex negative for DVT  Anticoagulation:eliquis BID    LOS: 11 days   Time spent with pt. :15 minutes. Cecilie Kicks  Nurse Practitioner Certified Pager [phone] or after 5pm and on weekends call [phone] 09/30/2013, 1:48 PM

## 2013-09-30 NOTE — Progress Notes (Signed)
I stopped by to see [name] before discharge to review HF education and check on outpatient follow-up.  He was able to teach back information provided about signs and symptoms of HF, when to call the physician, importance of daily weights, low sodium diet, fluid restriction, and medications.  He asked pertinent questions and was very thankful for the information.  He has a follow up next Tuesday with Jana Half at [facility].  He has [place] set up by care management and his daughter was stocking his home with "heart healthy low sodium foods".

## 2013-09-30 NOTE — Progress Notes (Signed)
Went over discharge instructions with patient, and his daughter. Explained each medication and reinforced CHF education using teach back. Patient and daughter expressed understanding. No additional questions or concerns related to discharge instructions. Some prescriptions sent to pharmacy, others received via paper prescriptions. [facility] delivered patients O2. [place] set up. Patient given 30 day card for elaquis. Paient discharged home with daughter. Roxan Hockey

## 2013-09-30 NOTE — Discharge Summary (Signed)
Physician Discharge Summary       Patient ID: EARNESTINE TUOHEY MRN: [id number] DOB/AGE: [birth year] 63 y.o.  Admit date: 09/19/2013 Discharge date: 09/30/2013  Discharge Diagnoses:  Principal Problem:   Acute combined systolic and diastolic CHF, NYHA class 3 -- previous normal LV function and moderate AS- re-evaluate; EF now 45-50%% with regional WMA Active Problems:   PAF - Maze at the time of his CABG/AVR 5/14. Recurrent a fib On Amiodarone, DCCV this admit with recurrent a fib, now persistent a fib   Aortic stenosis, severe- Tissue AVR 5/14   Listeria sepsis   CAD, CABG X 2 with SVG-PD/PL 5/14   Acute respiratory failure, with BiPAP needed on admit   Tobacco use   Bilateral lower extremity edema, secondary to chronic CHF   Cellulitis, lower extremity- treated   PAD (peripheral artery disease), decreased bil. ABIs   COPD (chronic obstructive pulmonary disease) with emphysema, weaning steroids, now with home oxygen   Long term (current) use of anticoagulants, Eliquis   Fever of unknown origin (FUO)-resolved   Delirium, resolved   Alcohol abuse   Back pain, spinal stenosis    Discharged Condition: good  Procedures: 09/25/13 TEE and DCCV by Dr. Antonieta Loveless Course: 40J AS s/p AVR/CABG(SVG to PDA and PL), MAZE(10/08/12), pAF, COPD who is admitted with volume overload.   Mr. Kawabata reported on admit that he has gained 60lbs over the past 6 months. He endorsed severe orthopnea that has required him to sleep in a chair for the past 3-4 months. He noted abdominal distention and LE edema with areas of weeping. He ambulates with a walker and can only move a few steps until he must take a break due to DOE. No chest pain, palpitations, or cough. Denies dietary indiscretion or medication non-adherence. He does note that due to insurance issues, he has not taken oral anticoagulation (apixiban) since 03/06/13. He has not had issues tolerating AC.  The patient's daughter came to town and  saw her father in this condition and insisted he come to the hospital. In the ED, Mr. Beverlin was noted to be hemodynamically stable, but saturating 87% on RA. He was given IV lasix and admitted to cardiology.   Patient and daughter asked that we address long standing lower back pain during this admission as it is a major limiting factor for ambulation.   Acute decompensated diastolic heart failure  -- 2D echo on 09/20/13 demonstrated low normal systolic function, with an EF of 50-55% and Grade II DD. ECHO 30-35% 08/2012  -- Improved after treatment with high dose lasix and metolazone. Continue lasix maintenance 40mg  QD.  -- Neg 8.2L. Wt 240 on admit-->now 226.  -- On admit creat was 1.4. Currently 1.26.  Atrial fibrillation/flutter with RVR - now rate controlled  -- S/p TEE/DCCV 09/25/13 with recurrence of atrial fibrillation with CVR  -- wean amiodarone and stop and eliquis 5mg  BID --  -- Digoxin resumed per Dr. Lovena Le monitor due to renal function.  -- LFTs were elevated but stable on recheck and he is on amio ( though will stop in 5 days) - will need to follow closely (most likely has fatty liver and ETOH liver disease)  --per Dr. Lovena Le for EP--He might ultimately benefit from AV node ablation and BiV PPM if his rate cannot be controlled. With his recent infection, he would not be a candidate for this therapy until resolved. AS- s/p pericardial tissue valve replacement. Valve appears stable by most recent echo  --  Valve area (Vmax): 1.47 cm^2. Indexed valve area (Vmax): 0.64 cm^2/m^2. Peak gradient (S): 17 mm Hg.  COPD- hypercarbia chronic. Now on steroids fast taper  -- No signs of exacerbation. Continue O2 via nasal cannula at home  -- add atrovent neb whole here- PFTs in 2014 showed severe emphysema- was given Breo with as needed albuterol- resume this at d/c. On prednisone.  -- PA and lateral x ray- show some fluid still, no PNA  -- Will need O2 at home  Gram + rod bacteremia (per IM and  ID)  -- Listeria- repeat blood cultures for clearing  -- MRI spine: MR THORACIC SPINE IMPRESSION  1. No evidence of thoracic spine infection.  2. Mild multifactorial degenerative spinal stenosis at T7-T8.  MR LUMBAR SPINE IMPRESSION  1. Abnormal bilateral erector spinae muscle signal, nonspecific and  can be commonly seen in debilitated patient's. No strong evidence of  infectious myositis at this time. No evidence of lumbar spinal  infection.  2. Lower lumbar spine degeneration without lumbar spinal stenosis.  3. Chronic abdominal aortic disease status post aortic bypass.  -abx per ID  -- TEE ok  -- Per ID- continue on ampicillin 2gm IV Q6hr. Since TEE and MRI is negative, will treat for 2 wk, using 6/3 as day 1 of 14 of antibiotics. For discharge can transition him to oral bactrim since he will likely have difficulty with picc line but most importantly we have eliminated disseminated disease. Can switch to bactrim DS 2 tabs Q 8hr. Recommend to check bmp twice a week x 1 wk to ensure cr is not significantly elevated. -ordered Back pain  -- Pain better  -- Neurosurgery referral for spinal stenosis once bacteremia cleared Alcohol abuse  -- CIWA- past symptoms for withdrawal  Deconditioning  -- PT eval - need home health PT but medicaid will not cover --would also need [facility] RN he had problems taking his meds in past.  Chronic Left leg cellulitis and peripheral vascular disease  -- No signs of acute infection and as per pt the leg appears much better now. Dressing as per wound care consult  --Duplex negative for DVT  Anticoagulation:eliquis BID  Pt seen by Dr. Tamala Julian prior to discharge and found stable to be discharged with home oxygen.  Lynn RN to visit.  Labs ordered through [facility].  Pt to be seen at heart failure clinic.  Once infection cleared will need neurosurgery consult.  Consults: cardiology- CHF, ID, IM,   Significant Diagnostic Studies:  BMET    Component Value Date/Time   NA 141  09/30/2013 0408   K 4.1 09/30/2013 0408   CL 97 09/30/2013 0408   CO2 35* 09/30/2013 0408   GLUCOSE 104* 09/30/2013 0408   BUN 43* 09/30/2013 0408   CREATININE 1.26 09/30/2013 0408   CREATININE 1.29 01/30/2013 1008   CALCIUM 8.2* 09/30/2013 0408   GFRNONAA 59* 09/30/2013 0408   GFRAA 68* 09/30/2013 0408    CBC    Component Value Date/Time   WBC 6.8 09/30/2013 0408   RBC 4.09* 09/30/2013 0408   HGB 11.0* 09/30/2013 0408   HCT 37.5* 09/30/2013 0408   PLT 241 09/30/2013 0408   MCV 91.7 09/30/2013 0408   MCH 26.9 09/30/2013 0408   MCHC 29.3* 09/30/2013 0408   RDW 17.6* 09/30/2013 0408    Vit B 648  AST 89; ALT 85 Sed rate 110 Urine culture clear Blood culture Listeria  TT ECHO: - Left ventricle: The cavity size was normal. Wall thickness  was increased in a pattern of mild LVH. Systolic function was normal. The estimated ejection fraction was in the range of 50% to 55%. - Left atrium: The atrium was severely dilated. - Right ventricle: The cavity size was mildly dilated. Systolic function was mildly reduced. - Right atrium: The atrium was severely dilated.  TEE and DCCV: - Left ventricle: Systolic function was mildly to moderately reduced. The estimated ejection fraction was in the range of 40% to 45%. - Aortic valve: A bioprosthesis was present. Thickening. No evidence of vegetation. - Mitral valve: There was mild regurgitation. Impressions: - Negative for thrombus. The patient was successfully cardioverted to NSR after the TEE   2 view CXR: CHEST 2 VIEW COMPARISON: 09/2013 FINDINGS: Sequelae of prior CABG, left atrial clipping, and aortic valve replacement are again identified. Cardiac silhouette remains mildly enlarged. Thoracic aortic calcification is noted. Pulmonary vascular congestion on the prior study has improved. Peripheral linear densities in the left mid to lower lung suggestive of Kerley B-lines are stable to slightly improved. Mildly increased interstitial densities in the right  mid to lower lung are stable to slightly improved. More prominent linear opacity in the basilar left lower lobe may reflect subsegmental atelectasis. There is no evidence of new, confluent airspace opacity, pleural effusion, or pneumothorax. No acute osseous abnormality is identified. IMPRESSION: Overall mildly improved appearance of the chest with scattered, residual interstitial opacities most suggestive of mild edema. No confluent airspace opacity  Venous dopplers: Summary: - No evidence of deep vein or superficial thrombosis involving the right lower extremity and left common femoral vein. - No evidence of Baker&'s cyst on the right.    Discharge Exam: Blood pressure 107/64, pulse 72, temperature 97.4 F (36.3 C), temperature source Oral, resp. rate 18, height 5\' 10"  (1.778 m), weight 226 lb 3.1 oz (102.6 kg), SpO2 93.00%.   Disposition: 01-Home or Self Care     Medication List    STOP taking these medications       simvastatin 20 MG tablet  Commonly known as:  ZOCOR      TAKE these medications       amiodarone 100 MG tablet  Commonly known as:  PACERONE  Take 1 tablet (100 mg total) by mouth 2 (two) times daily.     apixaban 5 MG Tabs tablet  Commonly known as:  ELIQUIS  Take 1 tablet (5 mg total) by mouth 2 (two) times daily.     atorvastatin 20 MG tablet  Commonly known as:  LIPITOR  Take 1 tablet (20 mg total) by mouth daily at 6 PM.     carvedilol 25 MG tablet  Commonly known as:  COREG  Take 1 tablet (25 mg total) by mouth 2 (two) times daily with a meal.     digoxin 0.25 MG tablet  Commonly known as:  LANOXIN  Take 1 tablet (0.25 mg total) by mouth daily.     folic acid 1 MG tablet  Commonly known as:  FOLVITE  Take 1 tablet (1 mg total) by mouth daily.     furosemide 40 MG tablet  Commonly known as:  LASIX  Take 1 tablet (40 mg total) by mouth daily.     lidocaine 5 %  Commonly known as:  LIDODERM  Place 1 patch onto the skin daily. Remove  & Discard patch within 12 hours or as directed by MD     multivitamin with minerals Tabs tablet  Take 1 tablet by mouth daily.  potassium chloride 10 MEQ tablet  Commonly known as:  K-DUR,KLOR-CON  Take 1 tablet (10 mEq total) by mouth daily.     predniSONE 10 MG tablet  Commonly known as:  DELTASONE  Take 3 tablets (30 mg total) by mouth daily with breakfast. Take 3 tabs on 10/01/13, then 2 tabs on 10/02/13, then 1 tab daily on 6/12 and 6/13 then stop.     sulfamethoxazole-trimethoprim 800-160 MG per tablet  Commonly known as:  BACTRIM DS  Take 2 tablets by mouth every 8 (eight) hours.     thiamine 100 MG tablet  Take 1 tablet (100 mg total) by mouth daily.       Follow-up Information   Follow up with Rande Brunt, NP On 10/14/2013. ( 10:45 am  [facility]--Code 0400 to get in gate)    Specialty:  Nurse Practitioner   Contact information:   [address] [address] [phone]       Follow up with Lorretta Harp, MD On 10/02/2013. (at 8:00 AM with Kerin Ransom, PAc )    Specialty:  Cardiology   Contact information:   [address] [place] [address] [phone]        Discharge Instructions:  Heart Healthy low sodium diet  Weigh daily Call [phone] if weight climbs more than 3 pounds in a day or 5 pounds in a week. No salt to very little salt in your diet.  No more than 2000 mg in a day. Call if increased shortness of breath or increased swelling.   no alcohol  Stop the amiodarone in 5 days, last dose on the 14th pm  Continue antibiotic until complete.  Call if decrease in appetite.  Home health will draw labs on you.  Tuesday and Thursday of this week and next. BMP.  Dig level next Tuesday.  On the prednisone you will take 3 tabs tomorrow and then 2 tabs on Thursday, then 1 tab on Friday and 1 tab on Aug 20, 2022.  On Amiodarone we deceased dose to 100 mg twice a day but to stop in 5 days.     Signed: Cecilie Kicks Nurse Practitioner-Certified [facility]: Antonieta Pert 09/30/2013, 4:22 PM  Time spent on discharge :>45 minutes.

## 2013-10-01 ENCOUNTER — Other Ambulatory Visit: Payer: Self-pay | Admitting: *Deleted

## 2013-10-01 MED ORDER — AMIODARONE HCL 100 MG PO TABS: 100.0000 mg | ORAL_TABLET | Freq: Two times a day (BID) | ORAL | Status: DC

## 2013-10-01 NOTE — Telephone Encounter (Signed)
Rx was sent to pharmacy electronically. 

## 2013-10-01 NOTE — Discharge Summary (Signed)
Appreciate.detail his recommendations and thoughts concerning management of atrial fibrillation. No further attempts at electrical cardioversion.  Digoxin was started yesterday. I would decrease the patient's amiodarone to 100 mg per day for 5 days and then DC. This will allow him to load with digoxin 0.25 mg daily.  He should followup with Dr. Gwenlyn Found within 7-10 days with an EKG. He should call of swelling or increasing shortness of breath. He should call if also appetite or other evidence of digoxin toxicity.

## 2013-10-02 ENCOUNTER — Ambulatory Visit (INDEPENDENT_AMBULATORY_CARE_PROVIDER_SITE_OTHER): Payer: Self-pay | Admitting: Cardiology

## 2013-10-02 ENCOUNTER — Encounter: Payer: Self-pay | Admitting: Cardiology

## 2013-10-02 VITALS — BP 136/74 | HR 104 | Ht 70.0 in | Wt 223.6 lb

## 2013-10-02 DIAGNOSIS — Z7901 Long term (current) use of anticoagulants: Secondary | ICD-10-CM

## 2013-10-02 DIAGNOSIS — I482 Chronic atrial fibrillation, unspecified: Secondary | ICD-10-CM

## 2013-10-02 DIAGNOSIS — A327 Listerial sepsis: Secondary | ICD-10-CM

## 2013-10-02 DIAGNOSIS — I350 Nonrheumatic aortic (valve) stenosis: Secondary | ICD-10-CM

## 2013-10-02 DIAGNOSIS — I5041 Acute combined systolic (congestive) and diastolic (congestive) heart failure: Secondary | ICD-10-CM

## 2013-10-02 DIAGNOSIS — I509 Heart failure, unspecified: Secondary | ICD-10-CM

## 2013-10-02 DIAGNOSIS — J438 Other emphysema: Secondary | ICD-10-CM

## 2013-10-02 DIAGNOSIS — J439 Emphysema, unspecified: Secondary | ICD-10-CM

## 2013-10-02 DIAGNOSIS — I2510 Atherosclerotic heart disease of native coronary artery without angina pectoris: Secondary | ICD-10-CM

## 2013-10-02 DIAGNOSIS — I359 Nonrheumatic aortic valve disorder, unspecified: Secondary | ICD-10-CM

## 2013-10-02 DIAGNOSIS — A329 Listeriosis, unspecified: Secondary | ICD-10-CM

## 2013-10-02 DIAGNOSIS — I4891 Unspecified atrial fibrillation: Secondary | ICD-10-CM

## 2013-10-02 DIAGNOSIS — M549 Dorsalgia, unspecified: Secondary | ICD-10-CM

## 2013-10-02 NOTE — Assessment & Plan Note (Signed)
TEE May 2015 OK

## 2013-10-02 NOTE — Assessment & Plan Note (Signed)
O2 down to 84 in the office while on RA

## 2013-10-02 NOTE — Progress Notes (Signed)
10/02/2013 [name]   25-May-1950  [id number]  Primary Physicia No PCP Per Patient Primary Cardiologist: Dr [name]  HPI:  63 y/o with a history of CAD and AS. He had CABG x1 with tissue AVR June 2014. He had post op PAF and had DCCV but didn't hold and he was placed on Amiodarone. His follow up was inconsistent secondary to financial issues. He presented in May 2015 with respiratory failure and acute on chronic diastolic CHF. He was Sparks to be septic with blood cultures positive for Listeria. He failed to hold NSR after another DCCV and it was decided to abandon NSR and leave him in AF with plans for rate control. He was not a candidate for any invasive procedures secondary to sepsis. He was discharge  09/30/13. He is tapering off his steroids and and his ABs are due to stop in 3 days. He has done OK since discharge. His O2 in the office was noted to be in the 80s on RA. He is significantly debilitated. He has an Therapist, sports coming to his home for labs today. He has a follow up in the CHF clinic 6/23.   Current Outpatient Prescriptions  Medication Sig Dispense Refill  . amiodarone (PACERONE) 100 MG tablet Take 100 mg by mouth daily.      Marland Kitchen apixaban (ELIQUIS) 5 MG TABS tablet Take 1 tablet (5 mg total) by mouth 2 (two) times daily.  60 tablet  6  . atorvastatin (LIPITOR) 20 MG tablet Take 1 tablet (20 mg total) by mouth daily at 6 PM.  30 tablet  6  . carvedilol (COREG) 25 MG tablet Take 1 tablet (25 mg total) by mouth 2 (two) times daily with a meal.  60 tablet  6  . digoxin (LANOXIN) 0.25 MG tablet Take 1 tablet (0.25 mg total) by mouth daily.  30 tablet  6  . folic acid (FOLVITE) 1 MG tablet Take 1 tablet (1 mg total) by mouth daily.  30 tablet  6  . furosemide (LASIX) 40 MG tablet Take 1 tablet (40 mg total) by mouth daily.  30 tablet  6  . lidocaine (LIDODERM) 5 % Place 1 patch onto the skin daily. Remove & Discard patch within 12 hours or as directed by MD  30 patch  1  . Multiple Vitamin  (MULTIVITAMIN WITH MINERALS) TABS tablet Take 1 tablet by mouth daily.      . potassium chloride (K-DUR,KLOR-CON) 10 MEQ tablet Take 1 tablet (10 mEq total) by mouth daily.  30 tablet  6  . predniSONE (DELTASONE) 10 MG tablet Take 3 tablets (30 mg total) by mouth daily with breakfast. Take 3 tabs on 10/01/13, then 2 tabs on 10/02/13, then 1 tab daily on 6/12 and 6/13 then stop.  7 tablet  0  . sulfamethoxazole-trimethoprim (BACTRIM DS) 800-160 MG per tablet Take 2 tablets by mouth every 8 (eight) hours.  42 tablet  0  . thiamine 100 MG tablet Take 1 tablet (100 mg total) by mouth daily.  30 tablet  1   No current facility-administered medications for this visit.    No Known Allergies  History   Social History  . Marital Status: Divorced    Spouse Name: N/A    Number of Children: N/A  . Years of Education: N/A   Occupational History  . retired    Social History Main Topics  . Smoking status: Former Smoker -- 1.00 packs/day for 43 years    Types: Cigarettes  Quit date: 09/12/2012  . Smokeless tobacco: Never Used     Comment: More than 43 + pack years as he smoked up to 2 1/2 ppd for many years. Had a period of cessation after femoral stent.  . Alcohol Use: 25.2 - 50.4 oz/week    42-84 Cans of beer per week     Comment: 6-12 cans of beer a day  . Drug Use: No  . Sexual Activity: Not on file   Other Topics Concern  . Not on file   Social History Narrative  . No narrative on file     Review of Systems: General: negative for chills, fever, night sweats or weight changes.  Cardiovascular: negative for chest pain, orthopnea, palpitations Dermatological: negative for rash Urologic: negative for hematuria Abdominal: negative for nausea, vomiting, diarrhea, bright red blood per rectum, melena, or hematemesis Neurologic: negative for visual changes, syncope, or dizziness All other systems reviewed and are otherwise negative except as noted above.    Blood pressure 136/74,  pulse 104, height 5\' 10"  (1.778 m), weight 223 lb 9.6 oz (101.424 kg), SpO2 90.00%.  General appearance: alert, cooperative, no distress, moderately obese and on O2 Lungs: decreasaed breath sounds, no wheezing or rales Heart: irregularly irregular rhythm and short systolic murmur Extremities: chronic venous changes, trace edema  EKG AF with VR 98  ASSESSMENT AND PLAN:   Acute combined systolic and diastolic CHF - EF  88-75% June 2015  Currently stable- wgt 223  Aortic stenosis, severe- Tissue AVR 5/14 TEE May 2015 OK  COPD on home oxygen O2 down to 84 in the office while on RA  Chronic a-fib- failed Amio-DCCV  AF with CVR- tapering off Amiodarone  Long term (current) use of anticoagulants, Eliquis Compliant  Listeria sepsis- May-June 2015 ABs for 3 more days  Back pain, spinal stenosis At some point her may need further evaluation by Neurosurgeon   PLAN  [name] is significantly debilitated. His HR is probably appropriate. Digoxin was added at discharge. He is coming off Amiodarone and his antibiotics finnish in a few days. He is to have a BMP today. He can see CHF clinic on the 23d and then an OV with [place] [name] knows him well) in July.   [name] KPA-C 10/02/2013 9:03 AM

## 2013-10-02 NOTE — Assessment & Plan Note (Signed)
ABs for 3 more days

## 2013-10-02 NOTE — Assessment & Plan Note (Signed)
AF with CVR- tapering off Amiodarone

## 2013-10-02 NOTE — Assessment & Plan Note (Signed)
At some point her may need further evaluation by Neurosurgeon

## 2013-10-02 NOTE — Assessment & Plan Note (Signed)
Compliant 

## 2013-10-02 NOTE — Patient Instructions (Signed)
Your physician recommends that you schedule a follow-up appointment in: 3 weeks with Mickel Baas, NP

## 2013-10-02 NOTE — Assessment & Plan Note (Signed)
Currently stable- wgt 223

## 2013-10-03 LAB — CULTURE, BLOOD (ROUTINE X 2): CULTURE: NO GROWTH

## 2013-10-06 NOTE — Anesthesia Postprocedure Evaluation (Signed)
Anesthesia Post Note  Patient: [name]  Procedure(s) Performed: Procedure(s) (LRB): TRANSESOPHAGEAL ECHOCARDIOGRAM (TEE) (N/A) CARDIOVERSION (N/A)  Anesthesia type: MAC  Patient location: PACU  Post pain: Pain level controlled and Adequate analgesia  Post assessment: Post-op Vital signs reviewed, Patient's Cardiovascular Status Stable and Respiratory Function Stable  Last Vitals:  Filed Vitals:   09/30/13 1425  BP: 136/69  Pulse: 101  Temp: 36.7 C  Resp: 20    Post vital signs: Reviewed and stable  Level of consciousness: awake, alert  and oriented  Complications: No apparent anesthesia complications

## 2013-10-07 ENCOUNTER — Encounter (HOSPITAL_COMMUNITY): Payer: Self-pay

## 2013-10-08 ENCOUNTER — Telehealth: Payer: Self-pay | Admitting: Cardiovascular Disease

## 2013-10-08 NOTE — Telephone Encounter (Signed)
Hold Dig x 1 week. Recheck level in 1 week.  May need to reconsider dosing -- ? QOD, but defer to Dr Hiram Comber. Leonie Man, MD

## 2013-10-08 NOTE — Telephone Encounter (Signed)
Patient notified of medication instructions per Dr. Ellyn Hack.  RN faxed copy of labs with written orders by Dr. Ellyn Hack to [facility].

## 2013-10-08 NOTE — Telephone Encounter (Signed)
Critical Digoxin level 2.62 (repeated and verified)  Fax any orders/recommendations to [phone] ([place])  Will defer to DOD (Dr. Ellyn Hack)

## 2013-10-10 NOTE — Progress Notes (Addendum)
Patient ID: [name], male   DOB: [birth year], 63 y.o.   MRN: [id number]  EP: Dr Lovena Le  Primary Cardiologist: Dr Gwenlyn Found  PCP : None.   HPI: [name] is 63 y.o. male with past medical history of severe aortic stenosis s/p tissue AV replacement, atrial fibrillation, heart failure, hypertension, ETOH abuse, A fib , and coronary artery disease.    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 Diuresed with IV lasix transitioned to po lasix.He had TEE DC-CV but went back into afib with controlled rate on amiodarone and eliquis. Marland Kitchen He was treated with 2 weeks of antibiotics. Discharge weight was 226 pounds.   He presents for post hospital follow up. He wears chronic oxygen at 2 liters [place]. SOB with exertion without oxygen. Weight at home 210 pounds.  Ambulating with a walker. Chronic pain in lower extremities due to spinal stenosis.  FOllwing low salt diet and limiting fluid to < 2 liters per day.  Followed by [facility] and he will complete today.   ECHO 30-35% 08/2012  ECHO 08/2013 EF 50-55% Grade II DD  TEE- EF 40-45%   SH: Former Biochemist, clinical. Quit smoking 1 year ago. Drink alcohol occasionally . Denies drug use. Lives alone  FH: Father died MI at 19 Brother MI    ROS: All systems negative except as listed in HPI, PMH and Problem List.  Past Medical History  Diagnosis Date  . Hypertension   . Gout   . Aortic stenosis     echo 06/19/08 with nomal LV function, moderate concentric LVH moderate aortic stenosis area 0.99 cm squared, peak gradient of 50 and mean of 31  . Atrial fibrillation   . Hyperlipidemia   . Atrial fibrillation with RVR, was in atrial fib in 04/2011 09/16/2012  . Tobacco use 09/16/2012  . Heart murmur   . Acute combined systolic and diastolic CHF, NYHA class 3 -- previous normal LV function and moderate AS- re-evaluate; EF now 30-35% with regional [facility] 09/16/2012  . Aortic stenosis, severe 09/23/2012   . CAD (coronary artery disease), single vessel disease 09/23/2012  . Cellulitis, lower extremity- treated 09/23/2012  . PAD (peripheral artery disease), decreased bil. ABIs 09/23/2012  . Gout flare. 09/29/12, Lt knee, improved with colchicine. 09/30/2012  . H/O aortic valve replacement   . Congestive heart failure   . Dysrhythmia     afib  . AS (aortic stenosis) with AVR with pericardial tissue valve  09/30/2013  . Back pain, spinal stenosis 09/30/2013    Current Outpatient Prescriptions  Medication Sig Dispense Refill  . apixaban (ELIQUIS) 5 MG TABS tablet Take 1 tablet (5 mg total) by mouth 2 (two) times daily.  60 tablet  6  . atorvastatin (LIPITOR) 20 MG tablet Take 1 tablet (20 mg total) by mouth daily at 6 PM.  30 tablet  6  . carvedilol (COREG) 25 MG tablet Take 1 tablet (25 mg total) by mouth 2 (two) times daily with a meal.  60 tablet  6  . folic acid (FOLVITE) 1 MG tablet Take 1 tablet (1 mg total) by mouth daily.  30 tablet  6  . furosemide (LASIX) 40 MG tablet Take 1 tablet (40 mg total) by mouth daily.  30 tablet  6  . lidocaine (LIDODERM) 5 % Place 1 patch onto the skin daily. Remove & Discard patch within 12 hours or as directed by MD  30 patch  1  . Multiple Vitamin (MULTIVITAMIN WITH MINERALS) TABS tablet Take 1 tablet by mouth daily.      . potassium chloride (K-DUR,KLOR-CON) 10 MEQ tablet Take 1 tablet (10 mEq total) by mouth daily.  30 tablet  6  . thiamine 100 MG tablet Take 1 tablet (100 mg total) by mouth daily.  30 tablet  1  . digoxin (LANOXIN) 0.25 MG tablet Take 1 tablet (0.25 mg total) by mouth daily.  30 tablet  6   No current facility-administered medications for this encounter.     PHYSICAL EXAM: Filed Vitals:   10/14/13 1043  BP: 118/65  Pulse: 100  Weight: 214 lb (97.07 kg)  SpO2: 93%    General:  Well appearing. No resp difficulty Brother present Ambulated in clinic with walker.  HEENT: normal Neck: supple. JVP flat. Carotids 2+ bilaterally; no bruits. No  lymphadenopathy or thryomegaly appreciated. Cor: PMI normal. irregular rate & rhythm. No rubs, gallops or murmurs. Lungs: decreased in the bases Abdomen: soft, nontender, nondistended. No hepatosplenomegaly. No bruits or masses. Good bowel sounds. Extremities: no cyanosis, clubbing, rash, R and LLE 1+edema Neuro: alert & orientedx3, cranial nerves grossly intact. Moves all 4 extremities w/o difficulty. Affect pleasant.      ASSESSMENT & PLAN: 1. Chronic Systolic/Diastolic HF. 07/979 ECHO 19-14%  NYHA II-III Volume status stable. Continue lasix 40 mg daily with an extra 40 mg for weight 214 pounds or greater. Continue carvedilol 25 mg twice a day. Not on dig per Dr Gwenlyn Found. Dig level and BMET today by [facility].  Asked him to wear compression stockings.   Reinforced daily weight, low salt food choices, and limiting fluid intake to < 2 liters per day.   2. Chronic A fib -failed DC-CV . Now off amiodarone per [facility].  Remains on eliquis.  3. Listeria  Per Blood cultures - treated with 2 weeks of antibiotics.  4. Alchol  Abuse:  remains alcohol free in 1 month  5. COPD- on chronic oxygen 6. Spinal Stenosis- needs referral to neurosurgeon  Referred to HF SW to assist with PCP  Follow up in 3 months and likely refer back to primary cardiologist, Dr Cristal Generous NP-C  11:35 AM

## 2013-10-14 ENCOUNTER — Ambulatory Visit (HOSPITAL_COMMUNITY)
Admission: RE | Admit: 2013-10-14 | Discharge: 2013-10-14 | Disposition: A | Discharge status: Home / Self Care | Payer: Self-pay | Source: Ambulatory Visit | Attending: Internal Medicine | Admitting: Internal Medicine

## 2013-10-14 ENCOUNTER — Encounter: Payer: Self-pay | Admitting: Licensed Clinical Social Worker

## 2013-10-14 VITALS — BP 118/65 | HR 100 | Wt 214.0 lb

## 2013-10-14 DIAGNOSIS — Z9981 Dependence on supplemental oxygen: Secondary | ICD-10-CM | POA: Insufficient documentation

## 2013-10-14 DIAGNOSIS — J438 Other emphysema: Secondary | ICD-10-CM

## 2013-10-14 DIAGNOSIS — I509 Heart failure, unspecified: Secondary | ICD-10-CM | POA: Insufficient documentation

## 2013-10-14 DIAGNOSIS — Z720 Tobacco use: Secondary | ICD-10-CM

## 2013-10-14 DIAGNOSIS — J4489 Other specified chronic obstructive pulmonary disease: Secondary | ICD-10-CM | POA: Insufficient documentation

## 2013-10-14 DIAGNOSIS — F17200 Nicotine dependence, unspecified, uncomplicated: Secondary | ICD-10-CM

## 2013-10-14 DIAGNOSIS — M4800 Spinal stenosis, site unspecified: Secondary | ICD-10-CM | POA: Insufficient documentation

## 2013-10-14 DIAGNOSIS — F1010 Alcohol abuse, uncomplicated: Secondary | ICD-10-CM

## 2013-10-14 DIAGNOSIS — I482 Chronic atrial fibrillation, unspecified: Secondary | ICD-10-CM

## 2013-10-14 DIAGNOSIS — I4891 Unspecified atrial fibrillation: Secondary | ICD-10-CM | POA: Insufficient documentation

## 2013-10-14 DIAGNOSIS — I5042 Chronic combined systolic (congestive) and diastolic (congestive) heart failure: Secondary | ICD-10-CM | POA: Insufficient documentation

## 2013-10-14 DIAGNOSIS — J449 Chronic obstructive pulmonary disease, unspecified: Secondary | ICD-10-CM | POA: Insufficient documentation

## 2013-10-14 NOTE — Patient Instructions (Signed)
Follow up in 3 months  If your weight is 214 or greater take an extra 40 mg of lasix   Do the following things EVERYDAY: 1) Weigh yourself in the morning before breakfast. Write it down and keep it in a log. 2) Take your medicines as prescribed 3) Eat low salt foods-Limit salt (sodium) to 2000 mg per day.  4) Stay as active as you can everyday 5) Limit all fluids for the day to less than 2 liters

## 2013-10-14 NOTE — Progress Notes (Signed)
CSW met with patient and brother in the clinic. Patient reports recent hospitalization and application for medicaid was completed at the time. Patient has SSD income although not eligible for medicare until 06/2014. Patient states that he paid cash for his medications and at the moment has all needed medications. Patient provided name and number of medicaid caseworker and CSW will attempt to follow up as patient has had no response from multiple attempts. Discussed need for PCP although will be auto assigned PCP upon approval of medicaid. CSW will follow up with status of medicaid to determine follow up on PCP. Raquel Sarna, [place]

## 2013-10-15 ENCOUNTER — Telehealth: Payer: Self-pay | Admitting: Licensed Clinical Social Worker

## 2013-10-15 NOTE — Telephone Encounter (Signed)
CSW rec'd return call from [facility] caseworker Mrs. Wynetta Emery [phone]) fax [phone] stating need for additional unpaid medical bills to be sent to her to complete deductible for medicaid eligibility. CSW contacted patient to inform of need to fax over bills to complete application process. Patient verbalized understanding and will contact daughter to send bills to [facility]. CSW available if further needs arise. Raquel Sarna, [place]

## 2013-10-16 ENCOUNTER — Encounter: Payer: Self-pay | Admitting: Cardiovascular Disease

## 2013-10-16 ENCOUNTER — Telehealth: Payer: Self-pay | Admitting: Cardiovascular Disease

## 2013-10-16 MED ORDER — DIGOXIN 250 MCG PO TABS: 0.2500 mg | ORAL_TABLET | ORAL | Status: DC

## 2013-10-16 NOTE — Telephone Encounter (Signed)
[facility] called requesting an order for a BMP to be drawn at his last visit today, if necessary.  He is scheduled to be D/C'd from their service if OK.  States he has reach all goals and is ambulatory.  Order given to get a BMP today and patient to keep appt 10/23/13 with Cecilie Kicks, NP.  Dig level drawn 6/16 was 0.8.  He will be instructed to restart Digoxin 0.25mg  QOD.

## 2013-10-17 ENCOUNTER — Encounter: Payer: Self-pay | Admitting: Cardiovascular Disease

## 2013-10-20 ENCOUNTER — Encounter: Payer: Self-pay | Admitting: *Deleted

## 2013-10-22 ENCOUNTER — Telehealth: Payer: Self-pay | Admitting: Licensed Clinical Social Worker

## 2013-10-22 NOTE — Telephone Encounter (Signed)
CSW rec'd call from patient's daughter requesting information regarding medicaid follow up. Daughter reports frustration with medicaid application process. Daughter states that she was told medicaid would request hospital bills to complete determination for deductible for medicaid approval. CSW provided number and fax for medicaid caseworker to obtain clarification for requested documents to complete medicaid application. Daughter verbalizes understanding of follow up and will contact CSW if further needs arise. CSW provided supportive intervention and encouraged daughter to call back if needed. Raquel Sarna, [place]

## 2013-10-23 ENCOUNTER — Encounter: Payer: Self-pay | Admitting: Cardiology

## 2013-10-23 ENCOUNTER — Ambulatory Visit (INDEPENDENT_AMBULATORY_CARE_PROVIDER_SITE_OTHER): Payer: Self-pay | Admitting: Cardiology

## 2013-10-23 ENCOUNTER — Telehealth: Payer: Self-pay | Admitting: Licensed Clinical Social Worker

## 2013-10-23 VITALS — BP 92/56 | HR 102 | Ht 70.0 in | Wt 218.0 lb

## 2013-10-23 DIAGNOSIS — I482 Chronic atrial fibrillation, unspecified: Secondary | ICD-10-CM

## 2013-10-23 DIAGNOSIS — I959 Hypotension, unspecified: Secondary | ICD-10-CM | POA: Insufficient documentation

## 2013-10-23 DIAGNOSIS — I480 Paroxysmal atrial fibrillation: Secondary | ICD-10-CM

## 2013-10-23 DIAGNOSIS — I509 Heart failure, unspecified: Secondary | ICD-10-CM

## 2013-10-23 DIAGNOSIS — I5041 Acute combined systolic (congestive) and diastolic (congestive) heart failure: Secondary | ICD-10-CM

## 2013-10-23 DIAGNOSIS — I2510 Atherosclerotic heart disease of native coronary artery without angina pectoris: Secondary | ICD-10-CM

## 2013-10-23 DIAGNOSIS — I9589 Other hypotension: Secondary | ICD-10-CM

## 2013-10-23 DIAGNOSIS — R609 Edema, unspecified: Secondary | ICD-10-CM

## 2013-10-23 DIAGNOSIS — R600 Localized edema: Secondary | ICD-10-CM

## 2013-10-23 DIAGNOSIS — I359 Nonrheumatic aortic valve disorder, unspecified: Secondary | ICD-10-CM

## 2013-10-23 DIAGNOSIS — Z7901 Long term (current) use of anticoagulants: Secondary | ICD-10-CM

## 2013-10-23 DIAGNOSIS — I350 Nonrheumatic aortic (valve) stenosis: Secondary | ICD-10-CM

## 2013-10-23 DIAGNOSIS — I4891 Unspecified atrial fibrillation: Secondary | ICD-10-CM

## 2013-10-23 DIAGNOSIS — J438 Other emphysema: Secondary | ICD-10-CM

## 2013-10-23 DIAGNOSIS — Z79899 Other long term (current) drug therapy: Secondary | ICD-10-CM

## 2013-10-23 DIAGNOSIS — J439 Emphysema, unspecified: Secondary | ICD-10-CM

## 2013-10-23 LAB — COMPLETE METABOLIC PANEL WITH GFR
ALBUMIN: 3.3 g/dL — AB (ref 3.5–5.2)
ALK PHOS: 64 U/L (ref 39–117)
ALT: 31 U/L (ref 0–53)
AST: 33 U/L (ref 0–37)
BUN: 27 mg/dL — AB (ref 6–23)
CO2: 31 meq/L (ref 19–32)
Calcium: 8.5 mg/dL (ref 8.4–10.5)
Chloride: 102 mEq/L (ref 96–112)
Creat: 1.31 mg/dL (ref 0.50–1.35)
GFR, EST AFRICAN AMERICAN: 66 mL/min
GFR, Est Non African American: 57 mL/min — ABNORMAL LOW
Glucose, Bld: 97 mg/dL (ref 70–99)
Potassium: 4.4 mEq/L (ref 3.5–5.3)
Sodium: 140 mEq/L (ref 135–145)
Total Bilirubin: 0.4 mg/dL (ref 0.2–1.2)
Total Protein: 6.6 g/dL (ref 6.0–8.3)

## 2013-10-23 NOTE — Patient Instructions (Signed)
1. Take only 1/2 of your Coreg today then the whole tablet two times a day tomarrow  2.Take an extra Lasix today  3. Your physician recommends that you schedule a follow-up appointment in:  4-5- weeks with Dr. Gwenlyn Found

## 2013-10-23 NOTE — Assessment & Plan Note (Signed)
We'll hold his Coreg tonight with additional Lasix and borderline blood pressure, though no dizziness or lightheadedness

## 2013-10-23 NOTE — Assessment & Plan Note (Signed)
Stable

## 2013-10-23 NOTE — Assessment & Plan Note (Signed)
His weight is up 4 pounds from last visit he'll take an extra 40 of Lasix today.  He has PRN order for Lasix as well.

## 2013-10-23 NOTE — Assessment & Plan Note (Signed)
Mild lower extremity edema the lungs are fairly clear we'll increase Lasix as stated.

## 2013-10-23 NOTE — Telephone Encounter (Signed)
CSW rec'd call from patient's daughter stating that she spoke with medicaid and was informed that patient's medicaid plan was terminated and new cycle for accrual of unpaid bills began on 09/30/2013. Daughter verbalizes frustration with system and inability to help her father. CSW provided information regarding the Medicaid (medically needy) program, Affordable Care Act and [facility] card. CSW encouraged daughter to follow up with [employer] as patient previously was able to obtain healthcare plan for small monthly fee thru [employer] although medicaid was approved and no longer needed [facility]. Daughter will pursue insurance plan [employer] and return call to CSW if further needs arise. Raquel Sarna, [place]

## 2013-10-23 NOTE — Assessment & Plan Note (Signed)
No chest pain minimal shortness of breath

## 2013-10-23 NOTE — Assessment & Plan Note (Signed)
No bleeding issues stable on eliquis

## 2013-10-23 NOTE — Assessment & Plan Note (Signed)
Rate controlled today at 78 beats per minute

## 2013-10-23 NOTE — Progress Notes (Signed)
10/23/2013   PCP: No PCP Per Patient   Chief Complaint  Patient presents with  . Follow-up    3 week visit    Primary Cardiologist:Dr. Adora Fridge   HPI:  63 y/o with a history of CAD and AS. He had CABG x1 with tissue AVR June 2014. He had post op PAF and had DCCV but didn't hold and he was placed on Amiodarone. His follow up was inconsistent secondary to financial issues. He presented in May 2015 with respiratory failure and acute on chronic diastolic CHF. He was found to be septic with blood cultures positive for Listeria. He failed to hold NSR after another DCCV and it was decided to abandon NSR and leave him in AF with plans for rate control. He was not a candidate for any invasive procedures secondary to sepsis. He was discharge 09/30/13. He has tapered off his steroids and and his ABs now stopped. He is now off the amiodarone as well. He had home health RN and physical therapy which have now been completed. And he has followed up in the heart failure clinic the end of June.  He did have an elevated dig level and his dig was held and has been restarted every other day for heart rate control.  Today he looks very well some shortness of breath he does have oxygen at home.  No fevers continues with back pain but is able to ambulate much better now than it discharge from the hospital he does use his walker.  His weight is up somewhat he states  in another pound up he is to take his Lasix but with the amount of edema I will have him take extra Lasix today.  He has no chest pain no significant shortness of breath when he wears his oxygen. He actually is feeling fairly well. He still has back pain with exertion and plans to see neurosurgeon when the social worker can help arrange this due to financial situation.    He will followup with heart failure clinic in September.       No Known Allergies  Current Outpatient Prescriptions  Medication Sig Dispense Refill  . apixaban  (ELIQUIS) 5 MG TABS tablet Take 1 tablet (5 mg total) by mouth 2 (two) times daily.  60 tablet  6  . atorvastatin (LIPITOR) 20 MG tablet Take 1 tablet (20 mg total) by mouth daily at 6 PM.  30 tablet  6  . carvedilol (COREG) 25 MG tablet Take 1 tablet (25 mg total) by mouth 2 (two) times daily with a meal.  60 tablet  6  . digoxin (LANOXIN) 0.25 MG tablet Take 1 tablet (0.25 mg total) by mouth every other day.  30 tablet  6  . folic acid (FOLVITE) 1 MG tablet Take 1 tablet (1 mg total) by mouth daily.  30 tablet  6  . furosemide (LASIX) 40 MG tablet Take 1 tablet (40 mg total) by mouth daily.  30 tablet  6  . lidocaine (LIDODERM) 5 % Place 1 patch onto the skin daily. Remove & Discard patch within 12 hours or as directed by MD  30 patch  1  . Multiple Vitamin (MULTIVITAMIN WITH MINERALS) TABS tablet Take 1 tablet by mouth daily.      . potassium chloride (K-DUR,KLOR-CON) 10 MEQ tablet Take 1 tablet (10 mEq total) by mouth daily.  30 tablet  6  . thiamine 100 MG tablet Take 1 tablet (  100 mg total) by mouth daily.  30 tablet  1   No current facility-administered medications for this visit.    Past Medical History  Diagnosis Date  . Hypertension   . Gout   . Aortic stenosis     echo 06/19/08 with nomal LV function, moderate concentric LVH moderate aortic stenosis area 0.99 cm squared, peak gradient of 50 and mean of 31  . Atrial fibrillation   . Hyperlipidemia   . Atrial fibrillation with RVR, was in atrial fib in 04/2011 09/16/2012  . Tobacco use 09/16/2012  . Heart murmur   . Acute combined systolic and diastolic CHF, NYHA class 3 -- previous normal LV function and moderate AS- re-evaluate; EF now 30-35% with [facility] 09/16/2012  . Aortic stenosis, severe 09/23/2012  . CAD (coronary artery disease), single vessel disease 09/23/2012  . Cellulitis, lower extremity- treated 09/23/2012  . PAD (peripheral artery disease), decreased bil. ABIs 09/23/2012  . Gout flare. 09/29/12, Lt knee, improved with  colchicine. 09/30/2012  . H/O aortic valve replacement   . Congestive heart failure   . Dysrhythmia     afib  . AS (aortic stenosis) with AVR with pericardial tissue valve  09/30/2013  . Back pain, spinal stenosis 09/30/2013    Past Surgical History  Procedure Laterality Date  . Iliac artery stent  10/20/08    stent to lt iliac  . Aorto bifem bypass  12/18/08    by Dr. Trula Slade  . Coronary artery bypass graft N/A 10/08/2012    Procedure: CORONARY ARTERY BYPASS GRAFTING (CABG);  Surgeon: Grace Isaac, MD;  Location: [place];  Service: Open Heart Surgery;  Laterality: N/A;  Coronary Artery bypass Graft times two utilizing the left greater saphenous vein harvested endoscopically  . Aortic valve replacement N/A 10/08/2012    Procedure: AORTIC VALVE REPLACEMENT (AVR);  Surgeon: Grace Isaac, MD;  Location: [place];  Service: Open Heart Surgery;  Laterality: N/A;  . Maze N/A 10/08/2012    Procedure: MAZE;  Surgeon: Grace Isaac, MD;  Location: [place];  Service: Open Heart Surgery;  Laterality: N/A;  . Intraoperative transesophageal echocardiogram N/A 10/08/2012    Procedure: INTRAOPERATIVE TRANSESOPHAGEAL ECHOCARDIOGRAM;  Surgeon: Grace Isaac, MD;  Location: [place];  Service: Open Heart Surgery;  Laterality: N/A;  . Endovein harvest of greater saphenous vein Bilateral 10/08/2012    Procedure: ENDOVEIN HARVEST OF GREATER SAPHENOUS VEIN;  Surgeon: Grace Isaac, MD;  Location: [place];  Service: Open Heart Surgery;  Laterality: Bilateral;  . US echocardiography  06/20/2011    mod concentric LVH,LA severely dilated,RA mildly dilated,mod. ca+ of the mitral apparatus,trace MR,mod. ca+ AOV w/stenosis.  Marland Kitchen Nm myocar perf wall motion  08/25/2008    normal  . Cardioversion N/A 02/25/2013    Procedure: CARDIOVERSION;  Surgeon: Sanda Klein, MD;  Location: [place];  Service: Cardiovascular;  Laterality: N/A;  . Tee without cardioversion N/A 09/25/2013    Procedure: TRANSESOPHAGEAL ECHOCARDIOGRAM  (TEE);  Surgeon: Thayer Headings, MD;  Location: [place];  Service: Cardiovascular;  Laterality: N/A;  . Cardioversion N/A 09/25/2013    Procedure: CARDIOVERSION;  Surgeon: Thayer Headings, MD;  Location: [place];  Service: Cardiovascular;  Laterality: N/A;   BMET    Component Value Date/Time   NA 141 09/30/2013 0408   K 4.1 09/30/2013 0408   CL 97 09/30/2013 0408   CO2 35* 09/30/2013 0408   GLUCOSE 104* 09/30/2013 0408   BUN 43* 09/30/2013 0408   CREATININE  1.26 09/30/2013 0408   CREATININE 1.29 01/30/2013 1008   CALCIUM 8.2* 09/30/2013 0408   GFRNONAA 59* 09/30/2013 0408   GFRAA 68* 09/30/2013 0408      VWA:[id number]:KK colds or fevers, no weight changes Skin:no rashes or ulcers HEENT:no blurred vision, no congestion CV:see HPI PUL:see HPI GI:no diarrhea constipation or melena, no indigestion GU:no hematuria, no dysuria MS:no joint pain, no claudication Neuro:no syncope, no lightheadedness Endo:no diabetes, no thyroid disease  Wt Readings from Last 3 Encounters:  10/23/13 218 lb (98.884 kg)  10/14/13 214 lb (97.07 kg)  10/02/13 223 lb 9.6 oz (101.424 kg)    PHYSICAL EXAM BP 92/56  Pulse 102  Ht 5\' 10"  (1.778 m)  Wt 218 lb (98.884 kg)  BMI 31.28 kg/m2 General:Pleasant affect, NAD Skin:Warm and dry, brisk capillary refill HEENT:normocephalic, sclera clear, mucus membranes moist Neck:supple, no JVD, no bruits  Heart:S1S2 RRR without murmur, gallup, rub or click Lungs:clear without rales, rhonchi, or wheezes DPT:ELMR, non tender, + BS, do not palpate liver spleen or masses Ext:+ lower ext edema 1-2+, 1+ pedal pulses, 2+ radial pulses Neuro:alert and oriented, MAE, follows commands, + facial symmetry EKG:a fib rate controlled. No acute changes otherwise.  ASSESSMENT AND PLAN Chronic a-fib- failed Amio-DCCV  Rate controlled today at 78 beats per minute  Bilateral lower extremity edema, secondary to chronic CHF His weight is up 4 pounds from last visit he'll take an extra 40  of Lasix today.  He has PRN order for Lasix as well.  Aortic stenosis, severe- Tissue AVR 5/14 Stable  CAD, CABG X 2 with SVG-PD/PL 5/14 No chest pain minimal shortness of breath  COPD on home oxygen Stable  Acute combined systolic and diastolic CHF - EF  61-51% June 2015  Mild lower extremity edema the lungs are fairly clear we'll increase Lasix as stated.  Hypotension We'll hold his Coreg tonight with additional Lasix and borderline blood pressure, though no dizziness or lightheadedness  Chronic anticoagulation No bleeding issues stable on eliquis   Follow with Dr. Gwenlyn Found in 4-5 weeks.

## 2013-10-24 LAB — DIGOXIN LEVEL: Digoxin Level: 1 ng/mL (ref 0.8–2.0)

## 2013-11-03 ENCOUNTER — Telehealth: Payer: Self-pay | Admitting: Cardiovascular Disease

## 2013-11-03 NOTE — Telephone Encounter (Signed)
Judson Roch is calling because [facility] is needing a form completed and sent back to them directly from this office with a prescription for the Eliquis . This is to help him get assistance with the medication .Marland Kitchen Please call    Thanks

## 2013-11-05 NOTE — Telephone Encounter (Signed)
LM for [name] that [name] is out of town and will follow up with her next week.  I am unsure what parts of the application have already been addressed if any.

## 2013-11-06 ENCOUNTER — Telehealth: Payer: Self-pay | Admitting: Cardiovascular Disease

## 2013-11-06 MED ORDER — APIXABAN 5 MG PO TABS: 5.0000 mg | ORAL_TABLET | Freq: Two times a day (BID) | ORAL | Status: DC

## 2013-11-06 NOTE — Telephone Encounter (Signed)
Notified patient's daughter that samples are at front desk.

## 2013-11-06 NOTE — Telephone Encounter (Signed)
Gay Filler told her we would give her samples for her dad if we have them until his medicine comes. He need some Eliquis 5 mg please.

## 2013-11-11 ENCOUNTER — Other Ambulatory Visit: Payer: Self-pay | Admitting: Pharmacist Clinician (PhC)/ Clinical Pharmacy Specialist

## 2013-11-11 MED ORDER — APIXABAN 5 MG PO TABS: 5.0000 mg | ORAL_TABLET | Freq: Two times a day (BID) | ORAL | Status: DC

## 2013-11-11 NOTE — Telephone Encounter (Signed)
rx printed for patient assistance program.  Spoke with daughter.  Rx faxed then mailed to [facility]  [address] [place], [id number]

## 2013-11-12 ENCOUNTER — Encounter: Payer: Self-pay | Admitting: Cardiovascular Disease

## 2013-11-12 ENCOUNTER — Telehealth: Payer: Self-pay | Admitting: Licensed Clinical Social Worker

## 2013-11-12 NOTE — Telephone Encounter (Signed)
CSW returned call to daughter who states that patient was able to apply for Affordable Care Act insurance and will be effective November 22, 2013. Daughter also mentioned that she followed up on prescription assistance program for cardiac medication and has received approval for free medication. Daughter requested assistance with obtaining PCP for patient. CSW provided information on PCP options and daughter will follow through with appointment. CSW continues to be available as needed. Raquel Sarna, [place]

## 2013-11-13 NOTE — Addendum Note (Signed)
Addended by: Dolan Amen on: 11/13/2013 09:13 AM   Modules accepted: Orders

## 2013-11-23 ENCOUNTER — Other Ambulatory Visit (HOSPITAL_COMMUNITY): Payer: Self-pay | Admitting: Cardiology

## 2013-11-24 ENCOUNTER — Other Ambulatory Visit: Payer: Self-pay | Admitting: *Deleted

## 2013-11-24 MED ORDER — APIXABAN 5 MG PO TABS: 5.0000 mg | ORAL_TABLET | Freq: Two times a day (BID) | ORAL | Status: DC

## 2013-11-24 NOTE — Telephone Encounter (Signed)
Rx refill denied to patient pharmacy, OTC medication

## 2013-12-01 ENCOUNTER — Encounter: Payer: Self-pay | Admitting: Cardiovascular Disease

## 2013-12-01 ENCOUNTER — Ambulatory Visit (INDEPENDENT_AMBULATORY_CARE_PROVIDER_SITE_OTHER): Payer: BC Managed Care – PPO | Admitting: Cardiovascular Disease

## 2013-12-01 VITALS — BP 142/86 | HR 103 | Ht 70.0 in | Wt 231.0 lb

## 2013-12-01 DIAGNOSIS — Z79899 Other long term (current) drug therapy: Secondary | ICD-10-CM

## 2013-12-01 DIAGNOSIS — I2510 Atherosclerotic heart disease of native coronary artery without angina pectoris: Secondary | ICD-10-CM

## 2013-12-01 DIAGNOSIS — I359 Nonrheumatic aortic valve disorder, unspecified: Secondary | ICD-10-CM

## 2013-12-01 DIAGNOSIS — R600 Localized edema: Secondary | ICD-10-CM

## 2013-12-01 DIAGNOSIS — I350 Nonrheumatic aortic (valve) stenosis: Secondary | ICD-10-CM

## 2013-12-01 DIAGNOSIS — I739 Peripheral vascular disease, unspecified: Secondary | ICD-10-CM

## 2013-12-01 DIAGNOSIS — R609 Edema, unspecified: Secondary | ICD-10-CM

## 2013-12-01 MED ORDER — FUROSEMIDE 40 MG PO TABS: 40.0000 mg | ORAL_TABLET | Freq: Two times a day (BID) | ORAL | Status: DC

## 2013-12-01 NOTE — Assessment & Plan Note (Signed)
He is keen to proceed 13 pounds in the last several weeks. He is on Lasix 40 mg a day. He is aware of the importance of avoiding salt. I'm going to double his Lasix to 40 mg by mouth twice a day, check a BMET in one week and he will see a mid-level provider after that

## 2013-12-01 NOTE — Assessment & Plan Note (Signed)
On statin therapy with his most recent lipid profile performed 09/17/12 revealed a total cholesterol of 145, LDL of 83 and HDL of 47

## 2013-12-01 NOTE — Assessment & Plan Note (Signed)
Status post coronary artery bypass grafting x2 with bioprosthetic aortic valve replacement May 2014. Recent 2-D echo showed an EF of 40-45% with a well functioning aortic bioprosthesis. There were no vegetations noted on his echocardiogram.

## 2013-12-01 NOTE — Progress Notes (Signed)
12/01/2013 [name]   03/19/1951  [id number]  Primary Physician No PCP Per Patient Primary Cardiologist: Lorretta Harp MD [name]   HPI:  63 y/o with a history of CAD and AS. He had CABG X2 with tissue AVR June 2014. He had post op PAF and had DCCV but didn't hold and he was placed on Amiodarone. His follow up was inconsistent secondary to financial issues. He presented in May 2015 with respiratory failure and acute on chronic diastolic CHF. He was found to be septic with blood cultures positive for Listeria. He failed to hold NSR after another DCCV and it was decided to abandon NSR and leave him in AF with plans for rate control. He was not a candidate for any invasive procedures secondary to sepsis. He was discharge 09/30/13. He has tapered off his steroids and and his ABs now stopped. He is now off the amiodarone as well. He had home health RN and physical therapy which have now been completed. And he has followed up in the heart failure clinic the end of June.  He did have an elevated dig level and his dig was held and has been restarted every other day for heart rate control. Today he looks very well some shortness of breath he does have oxygen at home. No fevers continues with back pain but is able to ambulate much better now than it discharge from the hospital he does use his walker.  He saw Cecilie Kicks in the office recently 10/23/13 and was relatively asymptomatic. He has gained 13 pounds since that time. He complains of dyspnea but denies chest pain. He is in chronic A. Fib with heart rates in the 100 range on Lasix 40 mg daily. He is aware of salt restriction.    Current Outpatient Prescriptions  Medication Sig Dispense Refill  . apixaban (ELIQUIS) 5 MG TABS tablet Take 1 tablet (5 mg total) by mouth 2 (two) times daily.  180 tablet  3  . atorvastatin (LIPITOR) 20 MG tablet Take 1 tablet (20 mg total) by mouth daily at 6 PM.  30 tablet  6  . carvedilol  (COREG) 25 MG tablet Take 1 tablet (25 mg total) by mouth 2 (two) times daily with a meal.  60 tablet  6  . digoxin (LANOXIN) 0.25 MG tablet Take 1 tablet (0.25 mg total) by mouth every other day.  30 tablet  6  . folic acid (FOLVITE) 1 MG tablet Take 1 tablet (1 mg total) by mouth daily.  30 tablet  6  . furosemide (LASIX) 40 MG tablet Take 1 tablet (40 mg total) by mouth 2 (two) times daily.  60 tablet  6  . lidocaine (LIDODERM) 5 % Place 1 patch onto the skin daily. Remove & Discard patch within 12 hours or as directed by MD  30 patch  1  . OXYGEN-HELIUM IN Inhale 3 L into the lungs continuous. Is in the habit of not using his oxygen when he leaves the house      . potassium chloride (K-DUR,KLOR-CON) 10 MEQ tablet Take 1 tablet (10 mEq total) by mouth daily.  30 tablet  6   No current facility-administered medications for this visit.    No Known Allergies  History   Social History  . Marital Status: Divorced    Spouse Name: N/A    Number of Children: N/A  . Years of Education: N/A   Occupational History  . retired  Social History Main Topics  . Smoking status: Former Smoker -- 1.00 packs/day for 43 years    Types: Cigarettes    Quit date: 09/12/2012  . Smokeless tobacco: Never Used     Comment: More than 43 + pack years as he smoked up to 2 1/2 ppd for many years. Had a period of cessation after femoral stent.  . Alcohol Use: 25.2 - 50.4 oz/week    42-84 Cans of beer per week     Comment: 6-12 cans of beer a day  . Drug Use: No  . Sexual Activity: Not on file   Other Topics Concern  . Not on file   Social History Narrative  . No narrative on file     Review of Systems: General: negative for chills, fever, night sweats or weight changes.  Cardiovascular: negative for chest pain, dyspnea on exertion, edema, orthopnea, palpitations, paroxysmal nocturnal dyspnea or shortness of breath Dermatological: negative for rash Respiratory: negative for cough or  wheezing Urologic: negative for hematuria Abdominal: negative for nausea, vomiting, diarrhea, bright red blood per rectum, melena, or hematemesis Neurologic: negative for visual changes, syncope, or dizziness All other systems reviewed and are otherwise negative except as noted above.    Blood pressure 142/86, pulse 103, height 5\' 10"  (1.778 m), weight 231 lb (104.781 kg).  General appearance: alert and no distress Neck: no adenopathy, no JVD, supple, symmetrical, trachea midline, thyroid not enlarged, symmetric, no tenderness/mass/nodules and loud left carotid bruit Lungs: clear to auscultation bilaterally Heart: irregularly irregular rhythm and soft outflow tract murmur Extremities: 1-2+ pitting edema  EKG H. The fibrillation with a ventricular response of 103 and septal Q waves with nonspecific ST and T-wave changes  ASSESSMENT AND PLAN:   Bilateral lower extremity edema, secondary to chronic CHF He is keen to proceed 13 pounds in the last several weeks. He is on Lasix 40 mg a day. He is aware of the importance of avoiding salt. I'm going to double his Lasix to 40 mg by mouth twice a day, check a BMET in one week and he will see a mid-level provider after that  PAD (peripheral artery disease), decreased bil. ABIs Status post remote left iliac stenting by myself in 2010 and subsequent aortobifemoral bypass grafting by Dr. Trula Slade.  HTN (hypertension) Controlled on current medications  Hyperlipidemia On statin therapy with his most recent lipid profile performed 09/17/12 revealed a total cholesterol of 145, LDL of 83 and HDL of 47  CAD, CABG X 2 with SVG-PD/PL 5/14 Status post coronary artery bypass grafting x2 with bioprosthetic aortic valve replacement May 2014. Recent 2-D echo showed an EF of 40-45% with a well functioning aortic bioprosthesis. There were no vegetations noted on his echocardiogram.      Lorretta Harp MD [facility], [facility] 12/01/2013 3:17 PM

## 2013-12-01 NOTE — Patient Instructions (Signed)
  We will see you back in follow up in 2 weeks with Lurena Joiner or Mickel Baas and 3 months with Dr Gwenlyn Found  Dr Gwenlyn Found has ordered: 1. Carotid Duplex- This test is an ultrasound of the carotid arteries in your neck. It looks at blood flow through these arteries that supply the brain with blood. Allow one hour for this exam. There are no restrictions or special instructions.  2. Lower extremity arterial doppler- During this test, ultrasound is used to evaluate arterial blood flow in the legs. Allow approximately one hour for this exam.   3. Increase the furosemide to 40mg  twice a day  4. Your physician recommends that you return for lab work in: 1 week

## 2013-12-01 NOTE — Assessment & Plan Note (Signed)
Status post remote left iliac stenting by myself in 2010 and subsequent aortobifemoral bypass grafting by Dr. Trula Slade.

## 2013-12-01 NOTE — Assessment & Plan Note (Signed)
Controlled on current medications 

## 2013-12-08 ENCOUNTER — Ambulatory Visit (HOSPITAL_COMMUNITY)
Admission: RE | Admit: 2013-12-08 | Discharge: 2013-12-08 | Disposition: A | Discharge status: Home / Self Care | Payer: BC Managed Care – PPO | Source: Ambulatory Visit | Attending: Cardiology | Admitting: Cardiology

## 2013-12-08 ENCOUNTER — Ambulatory Visit (HOSPITAL_BASED_OUTPATIENT_CLINIC_OR_DEPARTMENT_OTHER)
Admission: RE | Admit: 2013-12-08 | Discharge: 2013-12-08 | Disposition: A | Discharge status: Home / Self Care | Payer: BC Managed Care – PPO | Source: Ambulatory Visit | Attending: Cardiology | Admitting: Cardiology

## 2013-12-08 DIAGNOSIS — R609 Edema, unspecified: Secondary | ICD-10-CM | POA: Diagnosis not present

## 2013-12-08 DIAGNOSIS — R600 Localized edema: Secondary | ICD-10-CM

## 2013-12-08 DIAGNOSIS — I70219 Atherosclerosis of native arteries of extremities with intermittent claudication, unspecified extremity: Secondary | ICD-10-CM

## 2013-12-08 DIAGNOSIS — I6529 Occlusion and stenosis of unspecified carotid artery: Secondary | ICD-10-CM

## 2013-12-08 DIAGNOSIS — I739 Peripheral vascular disease, unspecified: Secondary | ICD-10-CM

## 2013-12-08 DIAGNOSIS — R0989 Other specified symptoms and signs involving the circulatory and respiratory systems: Secondary | ICD-10-CM

## 2013-12-08 NOTE — Progress Notes (Signed)
Bilateral Arterial Lower Ext. Completed. [name], BS, RDMS, RVT

## 2013-12-08 NOTE — Progress Notes (Signed)
Carotid Duplex Completed. [name], BS, RDMS, RVT  

## 2013-12-12 ENCOUNTER — Telehealth: Payer: Self-pay | Admitting: *Deleted

## 2013-12-12 DIAGNOSIS — I739 Peripheral vascular disease, unspecified: Secondary | ICD-10-CM

## 2013-12-12 DIAGNOSIS — I6529 Occlusion and stenosis of unspecified carotid artery: Secondary | ICD-10-CM

## 2013-12-12 NOTE — Telephone Encounter (Signed)
Order placed for repeat lower extremity arterial doppler in 1 year  

## 2013-12-12 NOTE — Telephone Encounter (Signed)
Message copied by Chauncy Lean on Fri Dec 12, 2013  2:49 PM ------      Message from: Lorretta Harp      Created: Tue Dec 09, 2013  8:44 PM       Significant progression bilaterally. Repeat 3 months ------

## 2013-12-12 NOTE — Telephone Encounter (Signed)
Order placed for repeat carotid dopplers in 3 months  

## 2013-12-12 NOTE — Telephone Encounter (Signed)
Message copied by Chauncy Lean on Fri Dec 12, 2013  3:12 PM ------      Message from: Lorretta Harp      Created: Tue Dec 09, 2013  9:08 PM       No change from prior study. Repeat in 12 months. ------

## 2013-12-16 ENCOUNTER — Encounter: Payer: Self-pay | Admitting: Cardiology

## 2013-12-16 ENCOUNTER — Ambulatory Visit (INDEPENDENT_AMBULATORY_CARE_PROVIDER_SITE_OTHER): Payer: BC Managed Care – PPO | Admitting: Cardiology

## 2013-12-16 VITALS — BP 135/63 | HR 89 | Ht 70.0 in | Wt 231.2 lb

## 2013-12-16 DIAGNOSIS — I4891 Unspecified atrial fibrillation: Secondary | ICD-10-CM

## 2013-12-16 DIAGNOSIS — Z79899 Other long term (current) drug therapy: Secondary | ICD-10-CM

## 2013-12-16 DIAGNOSIS — I359 Nonrheumatic aortic valve disorder, unspecified: Secondary | ICD-10-CM

## 2013-12-16 DIAGNOSIS — E782 Mixed hyperlipidemia: Secondary | ICD-10-CM

## 2013-12-16 DIAGNOSIS — J438 Other emphysema: Secondary | ICD-10-CM

## 2013-12-16 DIAGNOSIS — I509 Heart failure, unspecified: Secondary | ICD-10-CM

## 2013-12-16 DIAGNOSIS — Z7901 Long term (current) use of anticoagulants: Secondary | ICD-10-CM

## 2013-12-16 DIAGNOSIS — I482 Chronic atrial fibrillation, unspecified: Secondary | ICD-10-CM

## 2013-12-16 DIAGNOSIS — I350 Nonrheumatic aortic (valve) stenosis: Secondary | ICD-10-CM

## 2013-12-16 DIAGNOSIS — I5041 Acute combined systolic (congestive) and diastolic (congestive) heart failure: Secondary | ICD-10-CM

## 2013-12-16 MED ORDER — METOLAZONE 2.5 MG PO TABS: 2.5000 mg | ORAL_TABLET | Freq: Every day | ORAL | Status: DC

## 2013-12-16 NOTE — Progress Notes (Signed)
12/18/2013   PCP: No PCP Per Patient   Chief Complaint  Patient presents with  . Follow-up    follow-up dopplers    Primary Cardiologist: Dr. Adora Fridge   HPI:  63 y/o with a history of CAD and AS. He had CABG X2 with tissue AVR June 2014. He had post op PAF and had DCCV but didn't hold and he was placed on Amiodarone. His follow up was inconsistent secondary to financial issues. He presented in May 2015 with respiratory failure and acute on chronic diastolic CHF. He was found to be septic with blood cultures positive for Listeria. He failed to hold NSR after another DCCV and it was decided to abandon NSR and leave him in AF with plans for rate control. He was not a candidate for any invasive procedures secondary to sepsis. He was discharge 09/30/13. He has tapered off his steroids and and his ABs now stopped. He is now off the amiodarone as well. He had home health RN and physical therapy which have now been completed. And he has followed up in the heart failure clinic the end of June.   He recently saw Dr. Gwenlyn Found, he had gained 13 pounds. He complained of dyspnea but denied chest pain. He is in chronic A. Fib with heart rates in the 100 range on Lasix 40 mg daily. He is aware of salt restriction.  His Lasix was increased to 40 mg twice a week and he is following up today with me.    Unfortunately his weight has not changed he has a small abrasion on his right calf draining serous fluid, and continues with bilateral lower extremity edema. He has chronic shortness of breath and it is no worse than it has been.  He is on home oxygen.  He denies chest pain.   No Known Allergies  Current Outpatient Prescriptions  Medication Sig Dispense Refill  . apixaban (ELIQUIS) 5 MG TABS tablet Take 1 tablet (5 mg total) by mouth 2 (two) times daily.  180 tablet  3  . atorvastatin (LIPITOR) 20 MG tablet Take 1 tablet (20 mg total) by mouth daily at 6 PM.  30 tablet  6  . carvedilol (COREG) 25  MG tablet Take 1 tablet (25 mg total) by mouth 2 (two) times daily with a meal.  60 tablet  6  . digoxin (LANOXIN) 0.25 MG tablet Take 1 tablet (0.25 mg total) by mouth every other day.  30 tablet  6  . folic acid (FOLVITE) 1 MG tablet Take 1 tablet (1 mg total) by mouth daily.  30 tablet  6  . furosemide (LASIX) 40 MG tablet Take 1 tablet (40 mg total) by mouth 2 (two) times daily.  60 tablet  6  . OXYGEN-HELIUM IN Inhale 3 L into the lungs continuous. Is in the habit of not using his oxygen when he leaves the house      . potassium chloride (K-DUR,KLOR-CON) 10 MEQ tablet Take 1 tablet (10 mEq total) by mouth daily.  30 tablet  6  . metolazone (ZAROXOLYN) 2.5 MG tablet Take 1 tablet (2.5 mg total) by mouth daily. Take 30 min before lasix every am  30 tablet  6   No current facility-administered medications for this visit.    Past Medical History  Diagnosis Date  . Hypertension   . Gout   . Aortic stenosis     echo 06/19/08 with nomal LV function, moderate concentric  LVH moderate aortic stenosis area 0.99 cm squared, peak gradient of 50 and mean of 31  . Atrial fibrillation   . Hyperlipidemia   . Atrial fibrillation with RVR, was in atrial fib in 04/2011 09/16/2012  . Tobacco use 09/16/2012  . Heart murmur   . Acute combined systolic and diastolic CHF, NYHA class 3 -- previous normal LV function and moderate AS- re-evaluate; EF now 30-35% with regional [facility] 09/16/2012  . Aortic stenosis, severe 09/23/2012  . CAD (coronary artery disease), single vessel disease 09/23/2012  . Cellulitis, lower extremity- treated 09/23/2012  . PAD (peripheral artery disease), decreased bil. ABIs 09/23/2012  . Gout flare. 09/29/12, Lt knee, improved with colchicine. 09/30/2012  . H/O aortic valve replacement   . Congestive heart failure   . Dysrhythmia     afib  . AS (aortic stenosis) with AVR with pericardial tissue valve  09/30/2013  . Back pain, spinal stenosis 09/30/2013    Past Surgical History  Procedure Laterality  Date  . Iliac artery stent  10/20/08    stent to lt iliac  . Aorto bifem bypass  12/18/08    by Dr. Trula Slade  . Coronary artery bypass graft N/A 10/08/2012    Procedure: CORONARY ARTERY BYPASS GRAFTING (CABG);  Surgeon: Grace Isaac, MD;  Location: [place];  Service: Open Heart Surgery;  Laterality: N/A;  Coronary Artery bypass Graft times two utilizing the left greater saphenous vein harvested endoscopically  . Aortic valve replacement N/A 10/08/2012    Procedure: AORTIC VALVE REPLACEMENT (AVR);  Surgeon: Grace Isaac, MD;  Location: [place];  Service: Open Heart Surgery;  Laterality: N/A;  . Maze N/A 10/08/2012    Procedure: MAZE;  Surgeon: Grace Isaac, MD;  Location: [place];  Service: Open Heart Surgery;  Laterality: N/A;  . Intraoperative transesophageal echocardiogram N/A 10/08/2012    Procedure: INTRAOPERATIVE TRANSESOPHAGEAL ECHOCARDIOGRAM;  Surgeon: Grace Isaac, MD;  Location: [place];  Service: Open Heart Surgery;  Laterality: N/A;  . Endovein harvest of greater saphenous vein Bilateral 10/08/2012    Procedure: ENDOVEIN HARVEST OF GREATER SAPHENOUS VEIN;  Surgeon: Grace Isaac, MD;  Location: [place];  Service: Open Heart Surgery;  Laterality: Bilateral;  . US echocardiography  06/20/2011    mod concentric LVH,LA severely dilated,RA mildly dilated,mod. ca+ of the mitral apparatus,trace MR,mod. ca+ AOV w/stenosis.  Marland Kitchen Nm myocar perf wall motion  08/25/2008    normal  . Cardioversion N/A 02/25/2013    Procedure: CARDIOVERSION;  Surgeon: Sanda Klein, MD;  Location: [place];  Service: Cardiovascular;  Laterality: N/A;  . Tee without cardioversion N/A 09/25/2013    Procedure: TRANSESOPHAGEAL ECHOCARDIOGRAM (TEE);  Surgeon: Thayer Headings, MD;  Location: [place];  Service: Cardiovascular;  Laterality: N/A;  . Cardioversion N/A 09/25/2013    Procedure: CARDIOVERSION;  Surgeon: Thayer Headings, MD;  Location: [facility] ENDOSCOPY;  Service: Cardiovascular;  Laterality: N/A;     TDD:UKGURKY:HC colds or fevers, no weight changes from last visit Skin:no rashes or ulcers, + abrasion  HEENT:no blurred vision, no congestion CV:see HPI PUL:see HPI GI:no diarrhea constipation or melena, no indigestion GU:no hematuria, no dysuria MS:no joint pain, no claudication Neuro:no syncope, some lightheadedness Endo:no diabetes, no thyroid disease  Wt Readings from Last 3 Encounters:  12/16/13 231 lb 3.2 oz (104.872 kg)  12/01/13 231 lb (104.781 kg)  10/23/13 218 lb (98.884 kg)    PHYSICAL EXAM BP 135/63  Pulse 89  Ht 5\' 10"  (1.778 m)  Wt 231 lb  3.2 oz (104.872 kg)  BMI 33.17 kg/m2 General:Pleasant affect, NAD Skin:Warm and dry, brisk capillary refill HEENT:normocephalic, sclera clear, mucus membranes moist Neck:supple, no JVD, no bruits  Heart:S1S2 irreg irreg without murmur, gallup, rub or click Lungs:diminished throughout without rales, rhonchi, or wheezes MLJ:QGBE, mild tenderness over the liver, + BS, do not palpate spleen or masses Ext:3+ lower ext edema, wound on rt shin draining serrous fluid  2+ pedal pulses, 2+ radial pulses Neuro:alert and oriented, MAE, follows commands, + facial symmetry   ASSESSMENT AND PLAN Acute combined systolic and diastolic CHF - EF  01-00% June 2015  Weight has not decreased edema continues. We'll as a Rosalyn 2.5 mg daily prior to the first dose of Lasix. I will see him back next week if symptoms not improved he'll call as part of that time but otherwise at that point we may need to hospitalize him to get the fluid off.  Aortic stenosis, severe- Tissue AVR 5/14 Stable  Chronic a-fib- failed Amio-DCCV  Rate-controlled A. fib  COPD on home oxygen Continues home oxygen  Long term (current) use of anticoagulants, Eliquis Continue Eliquis

## 2013-12-16 NOTE — Patient Instructions (Signed)
Take metolazone 30 min before lasix every am am as directed for one week  Your physician recommends that you schedule a follow-up appointment in:  One week with Dustin Lyon NP or APP

## 2013-12-17 LAB — BASIC METABOLIC PANEL
BUN: 21 mg/dL (ref 6–23)
CO2: 33 meq/L — AB (ref 19–32)
CREATININE: 1.19 mg/dL (ref 0.50–1.35)
Calcium: 9.3 mg/dL (ref 8.4–10.5)
Chloride: 99 mEq/L (ref 96–112)
Glucose, Bld: 88 mg/dL (ref 70–99)
Potassium: 4.1 mEq/L (ref 3.5–5.3)
SODIUM: 140 meq/L (ref 135–145)

## 2013-12-18 NOTE — Assessment & Plan Note (Signed)
Continues home oxygen 

## 2013-12-18 NOTE — Assessment & Plan Note (Signed)
Stable

## 2013-12-18 NOTE — Assessment & Plan Note (Signed)
Weight has not decreased edema continues. We'll as a Rosalyn 2.5 mg daily prior to the first dose of Lasix. I will see him back next week if symptoms not improved he'll call as part of that time but otherwise at that point we may need to hospitalize him to get the fluid off.

## 2013-12-18 NOTE — Assessment & Plan Note (Signed)
Rate-controlled A. fib

## 2013-12-18 NOTE — Assessment & Plan Note (Signed)
-   Continue Eliquis 

## 2013-12-22 ENCOUNTER — Encounter: Payer: Self-pay | Admitting: *Deleted

## 2013-12-22 ENCOUNTER — Encounter: Payer: Self-pay | Admitting: Cardiology

## 2013-12-22 ENCOUNTER — Ambulatory Visit (INDEPENDENT_AMBULATORY_CARE_PROVIDER_SITE_OTHER): Payer: BC Managed Care – PPO | Admitting: Cardiology

## 2013-12-22 VITALS — BP 129/81 | HR 91 | Ht 70.0 in | Wt 225.8 lb

## 2013-12-22 DIAGNOSIS — I5041 Acute combined systolic (congestive) and diastolic (congestive) heart failure: Secondary | ICD-10-CM

## 2013-12-22 DIAGNOSIS — I359 Nonrheumatic aortic valve disorder, unspecified: Secondary | ICD-10-CM

## 2013-12-22 DIAGNOSIS — I2510 Atherosclerotic heart disease of native coronary artery without angina pectoris: Secondary | ICD-10-CM

## 2013-12-22 DIAGNOSIS — I350 Nonrheumatic aortic (valve) stenosis: Secondary | ICD-10-CM

## 2013-12-22 DIAGNOSIS — I482 Chronic atrial fibrillation, unspecified: Secondary | ICD-10-CM

## 2013-12-22 DIAGNOSIS — I509 Heart failure, unspecified: Secondary | ICD-10-CM

## 2013-12-22 DIAGNOSIS — Z79899 Other long term (current) drug therapy: Secondary | ICD-10-CM

## 2013-12-22 DIAGNOSIS — I4891 Unspecified atrial fibrillation: Secondary | ICD-10-CM

## 2013-12-22 DIAGNOSIS — Z7901 Long term (current) use of anticoagulants: Secondary | ICD-10-CM

## 2013-12-22 NOTE — Assessment & Plan Note (Signed)
Continues Eliquis. No complaints of bleeding

## 2013-12-22 NOTE — Assessment & Plan Note (Signed)
No chest pain

## 2013-12-22 NOTE — Progress Notes (Signed)
12/22/2013   PCP: No PCP Per Patient   Chief Complaint  Patient presents with  . Follow-up    1 week follow-up, pt feeling much better than last week    Primary Cardiologist: Dr. Adora Fridge   HPI:  63 y/o  Is back today for follow up from last week, with a history of CAD and AS. He had CABG X2 with tissue AVR June 2014. He had post op PAF and had DCCV but didn't hold and he was placed on Amiodarone. His follow up was inconsistent secondary to financial issues. He presented in May 2015 with respiratory failure and acute on chronic diastolic CHF. He was found to be septic with blood cultures positive for Listeria. He failed to hold NSR after another DCCV and it was decided to abandon NSR and leave him in AF with plans for rate control. He was not a candidate for any invasive procedures secondary to sepsis. He was discharge 09/30/13. He has tapered off his steroids and and his ABs now stopped. He is now off the amiodarone as well. He had home health RN and physical therapy which have now been completed. And he has followed up in the heart failure clinic the end of June.  He recently saw Dr. Gwenlyn Found, he had gained 13 pounds. He complained of dyspnea but denied chest pain. He is in chronic A. Fib with heart rates in the 100 range on Lasix 40 mg daily. He is aware of salt restriction. His Lasix was increased to 40 mg twice a week.   I saw the patient last week his weight had not changed on increased dose of furosemide, added Zaroxolyn 2.5 mg daily prior to the furosemide.  He is back today for followup. His weight is down 6 pounds his lungs are much improved. Who is a having acute shortness of breath on the last visit just increasing swelling.  His plan is to see primary care this week for his ongoing back pain to see if he can get some assistance.  BMET    Component Value Date/Time   NA 140 12/16/2013 1423   K 4.1 12/16/2013 1423   CL 99 12/16/2013 1423   CO2 33* 12/16/2013 1423   GLUCOSE  88 12/16/2013 1423   BUN 21 12/16/2013 1423   CREATININE 1.19 12/16/2013 1423   CREATININE 1.26 09/30/2013 0408   CALCIUM 9.3 12/16/2013 1423   GFRNONAA 57* 10/23/2013 1007   GFRNONAA 59* 09/30/2013 0408   GFRAA 66 10/23/2013 1007   GFRAA 68* 09/30/2013 0408        No Known Allergies  Current Outpatient Prescriptions  Medication Sig Dispense Refill  . apixaban (ELIQUIS) 5 MG TABS tablet Take 1 tablet (5 mg total) by mouth 2 (two) times daily.  180 tablet  3  . atorvastatin (LIPITOR) 20 MG tablet Take 1 tablet (20 mg total) by mouth daily at 6 PM.  30 tablet  6  . carvedilol (COREG) 25 MG tablet Take 1 tablet (25 mg total) by mouth 2 (two) times daily with a meal.  60 tablet  6  . digoxin (LANOXIN) 0.25 MG tablet Take 1 tablet (0.25 mg total) by mouth every other day.  30 tablet  6  . folic acid (FOLVITE) 1 MG tablet Take 1 tablet (1 mg total) by mouth daily.  30 tablet  6  . furosemide (LASIX) 40 MG tablet Take 1 tablet (40 mg total) by mouth 2 (two)  times daily.  60 tablet  6  . metolazone (ZAROXOLYN) 2.5 MG tablet Take 2.5 mg by mouth. Monday, Wednesday,Friday      . OXYGEN-HELIUM IN Inhale 3 L into the lungs continuous. Is in the habit of not using his oxygen when he leaves the house      . potassium chloride (K-DUR,KLOR-CON) 10 MEQ tablet Take 1 tablet (10 mEq total) by mouth daily.  30 tablet  6   No current facility-administered medications for this visit.    Past Medical History  Diagnosis Date  . Hypertension   . Gout   . Aortic stenosis     echo 06/19/08 with nomal LV function, moderate concentric LVH moderate aortic stenosis area 0.99 cm squared, peak gradient of 50 and mean of 31  . Atrial fibrillation   . Hyperlipidemia   . Atrial fibrillation with RVR, was in atrial fib in 04/2011 09/16/2012  . Tobacco use 09/16/2012  . Heart murmur   . Acute combined systolic and diastolic CHF, NYHA class 3 -- previous normal LV function and moderate AS- re-evaluate; EF now 30-35% with regional  [facility] 09/16/2012  . Aortic stenosis, severe 09/23/2012  . CAD (coronary artery disease), single vessel disease 09/23/2012  . Cellulitis, lower extremity- treated 09/23/2012  . PAD (peripheral artery disease), decreased bil. ABIs 09/23/2012  . Gout flare. 09/29/12, Lt knee, improved with colchicine. 09/30/2012  . H/O aortic valve replacement   . Congestive heart failure   . Dysrhythmia     afib  . AS (aortic stenosis) with AVR with pericardial tissue valve  09/30/2013  . Back pain, spinal stenosis 09/30/2013    Past Surgical History  Procedure Laterality Date  . Iliac artery stent  10/20/08    stent to lt iliac  . Aorto bifem bypass  12/18/08    by Dr. Trula Slade  . Coronary artery bypass graft N/A 10/08/2012    Procedure: CORONARY ARTERY BYPASS GRAFTING (CABG);  Surgeon: Grace Isaac, MD;  Location: [place];  Service: Open Heart Surgery;  Laterality: N/A;  Coronary Artery bypass Graft times two utilizing the left greater saphenous vein harvested endoscopically  . Aortic valve replacement N/A 10/08/2012    Procedure: AORTIC VALVE REPLACEMENT (AVR);  Surgeon: Grace Isaac, MD;  Location: [place];  Service: Open Heart Surgery;  Laterality: N/A;  . Maze N/A 10/08/2012    Procedure: MAZE;  Surgeon: Grace Isaac, MD;  Location: [place];  Service: Open Heart Surgery;  Laterality: N/A;  . Intraoperative transesophageal echocardiogram N/A 10/08/2012    Procedure: INTRAOPERATIVE TRANSESOPHAGEAL ECHOCARDIOGRAM;  Surgeon: Grace Isaac, MD;  Location: [place];  Service: Open Heart Surgery;  Laterality: N/A;  . Endovein harvest of greater saphenous vein Bilateral 10/08/2012    Procedure: ENDOVEIN HARVEST OF GREATER SAPHENOUS VEIN;  Surgeon: Grace Isaac, MD;  Location: [place];  Service: Open Heart Surgery;  Laterality: Bilateral;  . US echocardiography  06/20/2011    mod concentric LVH,LA severely dilated,RA mildly dilated,mod. ca+ of the mitral apparatus,trace MR,mod. ca+ AOV w/stenosis.  Marland Kitchen Nm myocar perf wall  motion  08/25/2008    normal  . Cardioversion N/A 02/25/2013    Procedure: CARDIOVERSION;  Surgeon: Sanda Klein, MD;  Location: [place];  Service: Cardiovascular;  Laterality: N/A;  . Tee without cardioversion N/A 09/25/2013    Procedure: TRANSESOPHAGEAL ECHOCARDIOGRAM (TEE);  Surgeon: Thayer Headings, MD;  Location: [place];  Service: Cardiovascular;  Laterality: N/A;  . Cardioversion N/A 09/25/2013    Procedure:  CARDIOVERSION;  Surgeon: Thayer Headings, MD;  Location[facility] ENDOSCOPY;  Service: Cardiovascular;  Laterality: N/A;    ZHG:DJMEQAS:TM colds or fevers, decrease weight by 6 lbs. Skin:no rashes or ulcers, abrasion on rt shin HEENT:no blurred vision, no congestion CV:see HPI PUL:see HPI GI:no diarrhea constipation or melena, no indigestion GU:no hematuria, no dysuria MS:no joint pain, no claudication Neuro:no syncope, no lightheadedness Endo:no diabetes, no thyroid disease  Wt Readings from Last 3 Encounters:  12/22/13 225 lb 12.8 oz (102.422 kg)  12/16/13 231 lb 3.2 oz (104.872 kg)  12/01/13 231 lb (104.781 kg)    PHYSICAL EXAM BP 129/81  Pulse 91  Ht 5\' 10"  (1.778 m)  Wt 225 lb 12.8 oz (102.422 kg)  BMI 32.40 kg/m2 General:Pleasant affect, NAD Skin:Warm and dry, brisk capillary refill HEENT:normocephalic, sclera clear, mucus membranes moist Neck:supple, no JVD, no bruits  Heart:S1S2 RRR with soft systolic murmur, no gallup, rub or click Lungs:clear without rales, rhonchi, or wheezes HDQ:QIWL, non tender, + BS, do not palpate liver spleen or masses Ext: tr to 1+ lower ext edema, improved, abrasion healing without as much serous drainage Neuro:alert and oriented, MAE, follows commands, + facial symmetry   ASSESSMENT AND PLAN Acute combined systolic and diastolic CHF - EF  79-89% June 2015  Improved lower extremity edema no increase in shortness of breath.  I decrease Zaroxolyn to 2.5 mg Monday Wednesdays and Fridays. I'll see him back in 2 weeks. Repeat basic  metabolic panel in one week.  He'll be seen in the heart failure clinic the end of September.  Aortic stenosis, severe- Tissue AVR 5/14 Stable  CAD, CABG X 2 with SVG-PD/PL 5/14 No chest pain  Chronic a-fib- failed Amio-DCCV  Rate-controlled atrial fibrillation  Long term (current) use of anticoagulants, Eliquis Continues Eliquis. No complaints of bleeding

## 2013-12-22 NOTE — Assessment & Plan Note (Signed)
Rate-controlled atrial fibrillation

## 2013-12-22 NOTE — Patient Instructions (Addendum)
Your physician recommends that you schedule a follow-up appointment in: 2 Weeks  Your physician recommends that you return for lab work [facility]  Your physician has recommended you make the following change in your medication: Decrease Zoroxolyn 2.5 mg Monday, Wednesday and Friday

## 2013-12-22 NOTE — Assessment & Plan Note (Signed)
Stable

## 2013-12-22 NOTE — Assessment & Plan Note (Signed)
Improved lower extremity edema no increase in shortness of breath.  I decrease Zaroxolyn to 2.5 mg Monday Wednesdays and Fridays. I'll see him back in 2 weeks. Repeat basic metabolic panel in one week.  He'll be seen in the heart failure clinic the end of September.

## 2013-12-31 LAB — BASIC METABOLIC PANEL
BUN: 41 mg/dL — AB (ref 6–23)
CHLORIDE: 92 meq/L — AB (ref 96–112)
CO2: 36 meq/L — AB (ref 19–32)
Calcium: 10.1 mg/dL (ref 8.4–10.5)
Creat: 1.38 mg/dL — ABNORMAL HIGH (ref 0.50–1.35)
Glucose, Bld: 146 mg/dL — ABNORMAL HIGH (ref 70–99)
Potassium: 4.4 mEq/L (ref 3.5–5.3)
Sodium: 135 mEq/L (ref 135–145)

## 2013-12-31 LAB — LIPID PANEL
CHOLESTEROL: 197 mg/dL (ref 0–200)
HDL: 73 mg/dL (ref >39)
LDL Cholesterol: 106 mg/dL — ABNORMAL HIGH (ref 0–99)
TRIGLYCERIDES: 90 mg/dL (ref <150)
Total CHOL/HDL Ratio: 2.7 Ratio
VLDL: 18 mg/dL (ref 0–40)

## 2013-12-31 LAB — HEPATIC FUNCTION PANEL
ALT: 28 U/L (ref 0–53)
AST: 29 U/L (ref 0–37)
Albumin: 3.9 g/dL (ref 3.5–5.2)
Alkaline Phosphatase: 51 U/L (ref 39–117)
BILIRUBIN DIRECT: 0.1 mg/dL (ref 0.0–0.3)
BILIRUBIN INDIRECT: 0.4 mg/dL (ref 0.2–1.2)
Total Bilirubin: 0.5 mg/dL (ref 0.2–1.2)
Total Protein: 7.9 g/dL (ref 6.0–8.3)

## 2014-01-01 ENCOUNTER — Telehealth: Payer: Self-pay | Admitting: *Deleted

## 2014-01-01 DIAGNOSIS — E782 Mixed hyperlipidemia: Secondary | ICD-10-CM

## 2014-01-01 DIAGNOSIS — Z79899 Other long term (current) drug therapy: Secondary | ICD-10-CM

## 2014-01-01 MED ORDER — ATORVASTATIN CALCIUM 40 MG PO TABS: 40.0000 mg | ORAL_TABLET | Freq: Every day | ORAL | Status: DC

## 2014-01-01 NOTE — Telephone Encounter (Signed)
i agree. 

## 2014-01-01 NOTE — Telephone Encounter (Signed)
Message copied by Chauncy Lean on Thu Jan 01, 2014 11:01 AM ------      Message from: Isaiah Serge      Created: Thu Jan 01, 2014  9:35 AM       Increase lipitor to 40 mg daily ------

## 2014-01-01 NOTE — Telephone Encounter (Signed)
Increase dose of lipitor sent to pharmacy.  Lab slip mailed to patient for recheck.   Patient nofified me that he now has a primary care doctor. He saw him this week and is following up next week on the 18th.  Mr Rampy feels that he should keep that appointment to help get him established with the PCP and cancel the appt with Mickel Baas.  Also, the week after that he has an appt in the heart failure clinic.  I verbalized agreement and understanding.  I will cancel the appt with Mickel Baas on 9/18.  I advised him to call with any symptoms or concerns should they arise.  He verbalized understanding.

## 2014-01-09 ENCOUNTER — Ambulatory Visit: Payer: Self-pay | Admitting: Cardiology

## 2014-01-13 NOTE — Progress Notes (Addendum)
Patient ID: CORTLAND CREHAN, male   DOB: [birth year], 63 y.o.   MRN: [id number]  EP: Dr Lovena Le  Primary Cardiologist: Dr Gwenlyn Found  PCP : None.   HPI: Mr Heckard is 63 y.o. male with past medical history of severe aortic stenosis s/p tissue AV replacement (09/2012) atrial fibrillation s/p MAZE (09/2012)  chronic combined systolic/diastolic HF, RV failure, HTN, PAD s/p L iliac stent in 2010 and aortobifemoral bypass graft, ETOH abuse, COPD, CAD s/p CABG with AV replacement and back pain from spinal stenosis.   Admitted and treated for acute failure complicated by listeria bacteremia and Afib RVR 09/2013. Diuresed with IV lasix transitioned to po lasix.He had TEE DC-CV but went back into afib with controlled rate on amiodarone and eliquis. Marland Kitchen He was treated with 2 weeks of antibiotics. Discharge weight was 226 pounds.   Follow up for Heart Failure: He is seeing NP/PA at [facility] office pretty frequently and they now have him on metolazone Mondays and Wednesdays. Overall feeling good. Fell Friday night after starting gabapentin and then he stopped taking. Reports he wears 3L of O2 except when he is out and going to doctors. Weight at home 217-224 lbs. Denies SOB, PND, orthopnea or CP. Not very active and reports getting SOB with 100 yards. Following a low salt diet and drinking less than 2L a day.   ECHO 30-35% 08/2012  ECHO 08/2013 EF 50-55% Grade II DD  TEE- EF 40-45%   SH: Former Biochemist, clinical. Quit smoking 1 year ago. Drink alcohol occasionally . Denies drug use. Lives alone  FH: Father died MI at 61 Brother MI    ROS: All systems negative except as listed in HPI, PMH and Problem List.  Past Medical History  Diagnosis Date  . Hypertension   . Gout   . Aortic stenosis     echo 06/19/08 with nomal LV function, moderate concentric LVH moderate aortic stenosis area 0.99 cm squared, peak gradient of 50 and mean of 31  . Hyperlipidemia   . Atrial fibrillation with RVR, was in atrial fib in 04/2011 09/16/2012     a) s/p MAZE (09/2012)   . Tobacco use 09/16/2012  . Heart murmur   . Chronic combined systolic and diastolic CHF (congestive heart failure) 09/16/2012    a) ECHO (08/2013) EF 50-55%, grade II DD b) TEE ECHO (09/2013): 40-45%, bioprosthesis present, mild MR  . Aortic stenosis, severe 09/23/2012  . CAD (coronary artery disease), single vessel disease 09/23/2012    a) CABG (SVG to PDA and PL) wtih AVR (09/2012)   . PAD (peripheral artery disease), decreased bil. ABIs 09/23/2012    a) ABIs (11/2013): Right mild arterial insufficiency, L normal; aroto-bifem bypass graft: not well visualized, bilateral SFAs = to greater than 50% in dimaeter reduction   . Gout flare. 09/29/12, Lt knee, improved with colchicine. 09/30/2012  . H/O aortic valve replacement   . AS (aortic stenosis) with AVR with pericardial tissue valve  09/30/2013  . Back pain, spinal stenosis 09/30/2013  . COPD (chronic obstructive pulmonary disease)     Current Outpatient Prescriptions  Medication Sig Dispense Refill  . apixaban (ELIQUIS) 5 MG TABS tablet Take 1 tablet (5 mg total) by mouth 2 (two) times daily.  180 tablet  3  . atorvastatin (LIPITOR) 40 MG tablet Take 1 tablet (40 mg total) by mouth daily at 6 PM.  30 tablet  6  . carvedilol (COREG) 25 MG tablet Take 1 tablet (25 mg total) by mouth 2 (two)  times daily with a meal.  60 tablet  6  . digoxin (LANOXIN) 0.25 MG tablet Take 1 tablet (0.25 mg total) by mouth every other day.  30 tablet  6  . folic acid (FOLVITE) 1 MG tablet Take 1 tablet (1 mg total) by mouth daily.  30 tablet  6  . furosemide (LASIX) 40 MG tablet Take 1 tablet (40 mg total) by mouth 2 (two) times daily.  60 tablet  6  . metolazone (ZAROXOLYN) 2.5 MG tablet Take 2.5 mg by mouth. Monday, Wednesday,Friday      . OXYGEN-HELIUM IN Inhale 3 L into the lungs continuous. Is in the habit of not using his oxygen when he leaves the house      . potassium chloride (K-DUR,KLOR-CON) 10 MEQ tablet Take 1 tablet (10 mEq total) by  mouth daily.  30 tablet  6  . tiZANidine (ZANAFLEX) 4 MG tablet Take 8 mg by mouth 2 (two) times daily.       No current facility-administered medications for this encounter.   Filed Vitals:   01/14/14 1009  BP: 125/68  Pulse: 66  Resp: 18  Weight: 224 lb 2 oz (101.662 kg)  SpO2: 90%    PHYSICAL EXAM: General:  Well appearing. No resp difficulty Brother present, Ambulated in clinic with walker.  HEENT: normal Neck: supple. JVP 8. Carotids 2+ bilaterally; no bruits. No lymphadenopathy or thryomegaly appreciated. Cor: PMI normal. Regular rate & rhythm. No rubs, gallops or murmurs. Lungs: diminshed in the bases Abdomen: soft, nontender, nondistended. No hepatosplenomegaly. No bruits or masses. Good bowel sounds. Extremities: no cyanosis, clubbing, rash, 2-3+ bilateral edema with cool extremities Neuro: alert & orientedx3, cranial nerves grossly intact. Moves all 4 extremities w/o difficulty. Affect pleasant.   ASSESSMENT & PLAN:  1. Chronic combined systolic/diastolic HF: NICM,  EF 86-76%, TAPSE < 1.0 (09/2013).  --NYHA III-IIIB. Suspect RV failure is main issue. Volume status is up will change from lasix to torsemide 20 mg BID and continue metolazone 2.5 mg on Mondays and Wednesdays. Check BMET and pro-BNP today.  -- Cut carvedilol back to 12.5 bid. Continue potassium 20 meq daily.  -- Start wearing compression stockings.  -- If remains volume overloaded and not getting better will need RHC -- Reinforced the need and importance of daily weights, a low sodium diet, and fluid restriction (less than 2 L a day). Instructed to call the HF clinic if weight increases more than 3 lbs overnight or 5 lbs in a week.  2. Severe COPD with cor pulmonale --check overnight oximetry.  --keep sats > 90% at all times --refer to pulmonary, Dr. Lake Bells 3 Chronic A fib s/p MAZE: failed DC-CV (09/2013). - no longer on amiodarone. Remains on eliquis with no bleeding issues. Cut back on coreg today to 12.5  mg BID. 4 Spinal Stenosis- follow by NSU   Labs in 2 weeks and F/U 1 month Junie Bame B NP-C  10:29 AM   Patient seen and examined with Junie Bame, NP. We discussed all aspects of the encounter. I agree with the assessment and plan as stated above. I have reviewed echo, PFTs and chart personally. He has severe COPD with significant RV failure and cor pulmonale. TAPSE quite low.  I walked him in clinic and sats dropped down to 85%. Stressed need to keep O2 sats 905 or greater at all times. Will decrease carvedilol to 12.5 bid. Change lasix to demadex 20 bid. Place TED hose. Check overnight oximetry. Refer to pulmonary to  optimize regimen. (will also need concentrator). If not improving can repeat RHC.   Total time spent 45 minutes. Over half that time spent discussing above.   Daniel Bensimhon,MD 1:06 PM

## 2014-01-14 ENCOUNTER — Ambulatory Visit (HOSPITAL_COMMUNITY)
Admission: RE | Admit: 2014-01-14 | Discharge: 2014-01-14 | Disposition: A | Discharge status: Home / Self Care | Payer: BC Managed Care – PPO | Source: Ambulatory Visit | Attending: Internal Medicine | Admitting: Internal Medicine

## 2014-01-14 ENCOUNTER — Encounter (HOSPITAL_COMMUNITY): Payer: Self-pay

## 2014-01-14 VITALS — BP 125/68 | HR 66 | Resp 18 | Wt 224.1 lb

## 2014-01-14 DIAGNOSIS — I2510 Atherosclerotic heart disease of native coronary artery without angina pectoris: Secondary | ICD-10-CM | POA: Diagnosis not present

## 2014-01-14 DIAGNOSIS — I509 Heart failure, unspecified: Secondary | ICD-10-CM | POA: Diagnosis present

## 2014-01-14 DIAGNOSIS — M4800 Spinal stenosis, site unspecified: Secondary | ICD-10-CM | POA: Insufficient documentation

## 2014-01-14 DIAGNOSIS — I739 Peripheral vascular disease, unspecified: Secondary | ICD-10-CM | POA: Insufficient documentation

## 2014-01-14 DIAGNOSIS — I10 Essential (primary) hypertension: Secondary | ICD-10-CM | POA: Diagnosis not present

## 2014-01-14 DIAGNOSIS — Z79899 Other long term (current) drug therapy: Secondary | ICD-10-CM | POA: Diagnosis not present

## 2014-01-14 DIAGNOSIS — I279 Pulmonary heart disease, unspecified: Secondary | ICD-10-CM | POA: Diagnosis not present

## 2014-01-14 DIAGNOSIS — Z954 Presence of other heart-valve replacement: Secondary | ICD-10-CM | POA: Diagnosis not present

## 2014-01-14 DIAGNOSIS — I50810 Right heart failure, unspecified: Secondary | ICD-10-CM | POA: Insufficient documentation

## 2014-01-14 DIAGNOSIS — I5042 Chronic combined systolic (congestive) and diastolic (congestive) heart failure: Secondary | ICD-10-CM | POA: Diagnosis not present

## 2014-01-14 DIAGNOSIS — J438 Other emphysema: Secondary | ICD-10-CM

## 2014-01-14 DIAGNOSIS — Z951 Presence of aortocoronary bypass graft: Secondary | ICD-10-CM | POA: Diagnosis not present

## 2014-01-14 DIAGNOSIS — Z87891 Personal history of nicotine dependence: Secondary | ICD-10-CM | POA: Diagnosis not present

## 2014-01-14 DIAGNOSIS — I4891 Unspecified atrial fibrillation: Secondary | ICD-10-CM | POA: Insufficient documentation

## 2014-01-14 DIAGNOSIS — I2609 Other pulmonary embolism with acute cor pulmonale: Secondary | ICD-10-CM | POA: Insufficient documentation

## 2014-01-14 DIAGNOSIS — J441 Chronic obstructive pulmonary disease with (acute) exacerbation: Secondary | ICD-10-CM | POA: Insufficient documentation

## 2014-01-14 DIAGNOSIS — Z7901 Long term (current) use of anticoagulants: Secondary | ICD-10-CM | POA: Diagnosis not present

## 2014-01-14 HISTORY — DX: Chronic obstructive pulmonary disease, unspecified: J44.9

## 2014-01-14 HISTORY — DX: Chronic combined systolic (congestive) and diastolic (congestive) heart failure: I50.42

## 2014-01-14 LAB — DIGOXIN LEVEL: Digoxin Level: 0.9 ng/mL (ref 0.8–2.0)

## 2014-01-14 LAB — BASIC METABOLIC PANEL
Anion gap: 11 (ref 5–15)
BUN: 32 mg/dL — AB (ref 6–23)
CO2: 35 mEq/L — ABNORMAL HIGH (ref 19–32)
CREATININE: 1.21 mg/dL (ref 0.50–1.35)
Calcium: 9.2 mg/dL (ref 8.4–10.5)
Chloride: 94 mEq/L — ABNORMAL LOW (ref 96–112)
GFR, EST AFRICAN AMERICAN: 71 mL/min — AB (ref >90)
GFR, EST NON AFRICAN AMERICAN: 62 mL/min — AB (ref >90)
Glucose, Bld: 123 mg/dL — ABNORMAL HIGH (ref 70–99)
Potassium: 3.2 mEq/L — ABNORMAL LOW (ref 3.7–5.3)
Sodium: 140 mEq/L (ref 137–147)

## 2014-01-14 LAB — PRO B NATRIURETIC PEPTIDE: Pro B Natriuretic peptide (BNP): 872.6 pg/mL — ABNORMAL HIGH (ref 0–125)

## 2014-01-14 MED ORDER — CARVEDILOL 12.5 MG PO TABS: 25.0000 mg | ORAL_TABLET | Freq: Two times a day (BID) | ORAL | Status: DC

## 2014-01-14 MED ORDER — TORSEMIDE 20 MG PO TABS: 20.0000 mg | ORAL_TABLET | Freq: Two times a day (BID) | ORAL | Status: DC

## 2014-01-14 NOTE — Addendum Note (Signed)
Encounter addended by: Rande Brunt, NP on: 01/14/2014  4:04 PM<BR>     Documentation filed: Notes Section

## 2014-01-14 NOTE — Patient Instructions (Signed)
Cut your coreg back to 12.5 mg (1/2 tablet) twice a day. You currently have 25 mg tablets but when you get your new prescription it will be 12.5 mg tablets and then you will take 1 tablet twice a day.  Stop your lasix.  Start taking torsemide 20 mg twice a day.   Continue metolazone 2.5 mg on Mondays and Wednesdays.  Refer to Pulmonology, Dr. Lake Bells.  Wear oxygen 24/7.  Will call [facility].   Get labs checked in 2 weeks and follow up in 1 month.  Do the following things EVERYDAY: 1) Weigh yourself in the morning before breakfast. Write it down and keep it in a log. 2) Take your medicines as prescribed 3) Eat low salt foods-Limit salt (sodium) to 2000 mg per day.  4) Stay as active as you can everyday 5) Limit all fluids for the day to less than 2 liters 6)

## 2014-01-14 NOTE — Addendum Note (Signed)
Encounter addended by: Rande Brunt, NP on: 01/14/2014  5:19 PM<BR>     Documentation filed: Notes Section

## 2014-01-15 ENCOUNTER — Telehealth (HOSPITAL_COMMUNITY): Payer: Self-pay

## 2014-01-15 ENCOUNTER — Other Ambulatory Visit (HOSPITAL_COMMUNITY): Payer: Self-pay | Admitting: Anesthesiology

## 2014-01-15 MED ORDER — POTASSIUM CHLORIDE CRYS ER 20 MEQ PO TBCR: 20.0000 meq | EXTENDED_RELEASE_TABLET | Freq: Every day | ORAL | Status: DC

## 2014-01-15 NOTE — Telephone Encounter (Signed)
Lab results reviewed with patient, instrcuted to take 30 meq potassium total today, then increase daily dosage to 20 meq once daily of potassium.  Aware and agreeable.  Patient already scheduled for 2 week lab repeat, will follow up at that time. Renee Pain

## 2014-01-27 ENCOUNTER — Ambulatory Visit (HOSPITAL_COMMUNITY)
Admission: RE | Admit: 2014-01-27 | Discharge: 2014-01-27 | Disposition: A | Discharge status: Home / Self Care | Payer: BC Managed Care – PPO | Source: Ambulatory Visit | Attending: Internal Medicine | Admitting: Internal Medicine

## 2014-01-27 DIAGNOSIS — I5042 Chronic combined systolic (congestive) and diastolic (congestive) heart failure: Secondary | ICD-10-CM | POA: Diagnosis not present

## 2014-01-27 DIAGNOSIS — I5022 Chronic systolic (congestive) heart failure: Secondary | ICD-10-CM

## 2014-01-27 DIAGNOSIS — I739 Peripheral vascular disease, unspecified: Secondary | ICD-10-CM

## 2014-01-27 LAB — BASIC METABOLIC PANEL
Anion gap: 10 (ref 5–15)
BUN: 39 mg/dL — AB (ref 6–23)
CALCIUM: 9.3 mg/dL (ref 8.4–10.5)
CO2: 36 meq/L — AB (ref 19–32)
Chloride: 97 mEq/L (ref 96–112)
Creatinine, Ser: 1.41 mg/dL — ABNORMAL HIGH (ref 0.50–1.35)
GFR calc Af Amer: 59 mL/min — ABNORMAL LOW (ref >90)
GFR calc non Af Amer: 51 mL/min — ABNORMAL LOW (ref >90)
GLUCOSE: 127 mg/dL — AB (ref 70–99)
Potassium: 3.6 mEq/L — ABNORMAL LOW (ref 3.7–5.3)
Sodium: 143 mEq/L (ref 137–147)

## 2014-01-29 ENCOUNTER — Telehealth (HOSPITAL_COMMUNITY): Payer: Self-pay | Admitting: Anesthesiology

## 2014-01-29 MED ORDER — TORSEMIDE 20 MG PO TABS: ORAL_TABLET | ORAL | Status: DC

## 2014-01-29 MED ORDER — POTASSIUM CHLORIDE CRYS ER 20 MEQ PO TBCR: 20.0000 meq | EXTENDED_RELEASE_TABLET | Freq: Two times a day (BID) | ORAL | Status: DC

## 2014-01-29 NOTE — Telephone Encounter (Signed)
Reviewed labs. Creatinine slightly elevated, however patient reports more edema and can't get shoes on. Weight up 3 lbs. Will increase torsemide to 40 mg q am and 20 mg q pm. K+ 3.6 will increase potassium to 40 meq daily.

## 2014-02-10 ENCOUNTER — Ambulatory Visit (HOSPITAL_COMMUNITY)
Admission: RE | Admit: 2014-02-10 | Discharge: 2014-02-10 | Disposition: A | Discharge status: Home / Self Care | Payer: BC Managed Care – PPO | Source: Ambulatory Visit | Attending: Internal Medicine | Admitting: Internal Medicine

## 2014-02-10 DIAGNOSIS — I5042 Chronic combined systolic (congestive) and diastolic (congestive) heart failure: Secondary | ICD-10-CM | POA: Insufficient documentation

## 2014-02-10 DIAGNOSIS — I5022 Chronic systolic (congestive) heart failure: Secondary | ICD-10-CM | POA: Diagnosis not present

## 2014-02-10 LAB — BASIC METABOLIC PANEL
Anion gap: 14 (ref 5–15)
BUN: 30 mg/dL — AB (ref 6–23)
CALCIUM: 9.2 mg/dL (ref 8.4–10.5)
CO2: 34 meq/L — AB (ref 19–32)
CREATININE: 1.37 mg/dL — AB (ref 0.50–1.35)
Chloride: 93 mEq/L — ABNORMAL LOW (ref 96–112)
GFR calc Af Amer: 61 mL/min — ABNORMAL LOW (ref >90)
GFR calc non Af Amer: 53 mL/min — ABNORMAL LOW (ref >90)
GLUCOSE: 139 mg/dL — AB (ref 70–99)
Potassium: 3.8 mEq/L (ref 3.7–5.3)
Sodium: 141 mEq/L (ref 137–147)

## 2014-02-18 ENCOUNTER — Ambulatory Visit (HOSPITAL_COMMUNITY)
Admission: RE | Admit: 2014-02-18 | Discharge: 2014-02-18 | Disposition: A | Discharge status: Home / Self Care | Payer: BC Managed Care – PPO | Source: Ambulatory Visit | Attending: Cardiology | Admitting: Cardiology

## 2014-02-18 ENCOUNTER — Telehealth: Payer: Self-pay | Admitting: Pulmonary Disease

## 2014-02-18 VITALS — BP 84/62 | Wt 228.8 lb

## 2014-02-18 DIAGNOSIS — I5042 Chronic combined systolic (congestive) and diastolic (congestive) heart failure: Secondary | ICD-10-CM | POA: Insufficient documentation

## 2014-02-18 DIAGNOSIS — E785 Hyperlipidemia, unspecified: Secondary | ICD-10-CM | POA: Diagnosis not present

## 2014-02-18 DIAGNOSIS — J449 Chronic obstructive pulmonary disease, unspecified: Secondary | ICD-10-CM | POA: Diagnosis not present

## 2014-02-18 DIAGNOSIS — J438 Other emphysema: Secondary | ICD-10-CM

## 2014-02-18 DIAGNOSIS — I4891 Unspecified atrial fibrillation: Secondary | ICD-10-CM | POA: Diagnosis not present

## 2014-02-18 DIAGNOSIS — Z952 Presence of prosthetic heart valve: Secondary | ICD-10-CM | POA: Diagnosis not present

## 2014-02-18 DIAGNOSIS — I350 Nonrheumatic aortic (valve) stenosis: Secondary | ICD-10-CM | POA: Diagnosis not present

## 2014-02-18 DIAGNOSIS — I482 Chronic atrial fibrillation, unspecified: Secondary | ICD-10-CM

## 2014-02-18 DIAGNOSIS — I2781 Cor pulmonale (chronic): Secondary | ICD-10-CM | POA: Insufficient documentation

## 2014-02-18 DIAGNOSIS — Z951 Presence of aortocoronary bypass graft: Secondary | ICD-10-CM | POA: Insufficient documentation

## 2014-02-18 DIAGNOSIS — I739 Peripheral vascular disease, unspecified: Secondary | ICD-10-CM | POA: Insufficient documentation

## 2014-02-18 DIAGNOSIS — I2510 Atherosclerotic heart disease of native coronary artery without angina pectoris: Secondary | ICD-10-CM | POA: Diagnosis not present

## 2014-02-18 DIAGNOSIS — F1010 Alcohol abuse, uncomplicated: Secondary | ICD-10-CM | POA: Diagnosis not present

## 2014-02-18 DIAGNOSIS — M4800 Spinal stenosis, site unspecified: Secondary | ICD-10-CM | POA: Insufficient documentation

## 2014-02-18 DIAGNOSIS — I10 Essential (primary) hypertension: Secondary | ICD-10-CM | POA: Diagnosis not present

## 2014-02-18 DIAGNOSIS — R011 Cardiac murmur, unspecified: Secondary | ICD-10-CM | POA: Diagnosis not present

## 2014-02-18 DIAGNOSIS — I509 Heart failure, unspecified: Secondary | ICD-10-CM

## 2014-02-18 DIAGNOSIS — I50810 Right heart failure, unspecified: Secondary | ICD-10-CM

## 2014-02-18 MED ORDER — SPIRONOLACTONE 25 MG PO TABS: 25.0000 mg | ORAL_TABLET | Freq: Every day | ORAL | Status: DC

## 2014-02-18 MED ORDER — POTASSIUM CHLORIDE CRYS ER 20 MEQ PO TBCR: 20.0000 meq | EXTENDED_RELEASE_TABLET | Freq: Every day | ORAL | Status: DC

## 2014-02-18 NOTE — Telephone Encounter (Signed)
Dr. Haroldine Laws has called office and stated that he does not want myself, Dr. Melvyn Novas, or Dr. Annamaria Boots to see his patients.  So my question is whether this is coming from the pt or from Dr. Haroldine Laws.   Please call the pt and see if this is his request, and if so, it is ok with me to switch to another doc.  Will have to be cleared by BQ as well.

## 2014-02-18 NOTE — Telephone Encounter (Signed)
Spoke with the pt and he states that this was his decision  Dr.  Lake Bells is this okay with you? Please advise, thanks!

## 2014-02-18 NOTE — Patient Instructions (Signed)
Start Spironolactone 25 mg daily  Decrease Potassium to 20 meq daily  Labs in 1 week when you see Dr Andria Rhein have been referred to Dr Lake Bells in Pulmonary  Your physician recommends that you schedule a follow-up appointment in: 1 month

## 2014-02-18 NOTE — Progress Notes (Signed)
Patient ID: [name], male   DOB: May 08, 1950, 63 y.o.   MRN: [id number]  EP: Dr Lovena Le  Primary Cardiologist: Dr Gwenlyn Found  PCP : None.   HPI: [name] is 63 y.o. male with past medical history of severe aortic stenosis s/p tissue AV replacement (09/2012) atrial fibrillation s/p MAZE (09/2012)  chronic combined systolic/diastolic HF, RV failure, HTN, PAD s/p L iliac stent in 2010 and aortobifemoral bypass graft, ETOH abuse, severe COPD with cor pulmonale, CAD s/p CABG with AV replacement and back pain from spinal stenosis.   Admitted and treated for acute failure complicated by listeria bacteremia and Afib RVR 09/2013. Diuresed with IV lasix transitioned to po lasix.He had TEE DC-CV but went back into afib with controlled rate on amiodarone and eliquis. He was treated with 2 weeks of antibiotics. Discharge weight was 226 pounds.   Follow up for Heart Failure: At last visit switched lasix to torsemide 40/20 and stressed need to wear O2 at all times for cor pulmonale. Referred to Pulmonology but has not seen him yet. Urine output not as good on torsemide. Weighing every day. Weight up 3-5 pounds. Has not taken extra torsemide. Taking metolazone M,W,F. Using O2 more but not wearing today. Had ONOX test in 9/15. Baseline sat 83%. Spent over 6 hours under 88%. However he was told not to wear his typical nighttime O2 for test. Not wearing O2 when he goes out because current O2 tank to heavy to use with walker.   ECHO 30-35% 08/2012  ECHO 08/2013 EF 50-55% Grade II DD  TEE- EF 40-45%   PFTs 9/14 FEV1 1.1L (34%) FVC 2.57L (57%) FEF 25-75% (17%) DLCO 41%  SH: Former Biochemist, clinical. Quit smoking 1 year ago. Drink alcohol occasionally . Denies drug use. Lives alone  FH: Father died MI at 70 Brother MI    ROS: All systems negative except as listed in HPI, PMH and Problem List.  Past Medical History  Diagnosis Date  . Hypertension   . Gout   . Aortic stenosis     echo 06/19/08 with nomal LV function,  moderate concentric LVH moderate aortic stenosis area 0.99 cm squared, peak gradient of 50 and mean of 31  . Hyperlipidemia   . Atrial fibrillation with RVR, was in atrial fib in 04/2011 09/16/2012    a) s/p MAZE (09/2012)   . Tobacco use 09/16/2012  . Heart murmur   . Chronic combined systolic and diastolic CHF (congestive heart failure) 09/16/2012    a) ECHO (08/2013) EF 50-55%, grade II DD b) TEE ECHO (09/2013): 40-45%, bioprosthesis present, mild Dustin  . Aortic stenosis, severe 09/23/2012  . CAD (coronary artery disease), single vessel disease 09/23/2012    a) CABG (SVG to PDA and PL) wtih AVR (09/2012)   . PAD (peripheral artery disease), decreased bil. ABIs 09/23/2012    a) ABIs (11/2013): Right mild arterial insufficiency, L normal; aroto-bifem bypass graft: not well visualized, bilateral SFAs = to greater than 50% in dimaeter reduction   . Gout flare. 09/29/12, Lt knee, improved with colchicine. 09/30/2012  . H/O aortic valve replacement   . AS (aortic stenosis) with AVR with pericardial tissue valve  09/30/2013  . Back pain, spinal stenosis 09/30/2013  . COPD (chronic obstructive pulmonary disease)     Current Outpatient Prescriptions  Medication Sig Dispense Refill  . apixaban (ELIQUIS) 5 MG TABS tablet Take 1 tablet (5 mg total) by mouth 2 (two) times daily.  180 tablet  3  . atorvastatin (  LIPITOR) 40 MG tablet Take 1 tablet (40 mg total) by mouth daily at 6 PM.  30 tablet  6  . carvedilol (COREG) 12.5 MG tablet Take 2 tablets (25 mg total) by mouth 2 (two) times daily with a meal.  30 tablet  3  . digoxin (LANOXIN) 0.25 MG tablet Take 1 tablet (0.25 mg total) by mouth every other day.  30 tablet  6  . folic acid (FOLVITE) 1 MG tablet Take 1 tablet (1 mg total) by mouth daily.  30 tablet  6  . gabapentin (NEURONTIN) 100 MG capsule Take 200 mg by mouth 2 (two) times daily.       . metolazone (ZAROXOLYN) 2.5 MG tablet Take 2.5 mg by mouth. Monday, Wednesday,Friday      . OXYGEN-HELIUM IN Inhale 3 L into  the lungs continuous. Is in the habit of not using his oxygen when he leaves the house      . potassium chloride SA (K-DUR,KLOR-CON) 20 MEQ tablet Take 1 tablet (20 mEq total) by mouth 2 (two) times daily.  60 tablet  6  . tiZANidine (ZANAFLEX) 4 MG tablet Take 8 mg by mouth 2 (two) times daily.      Marland Kitchen torsemide (DEMADEX) 20 MG tablet Take 40 mg (2 tabs) in the morning and 20 mg (1 tab) in the evening  90 tablet  3   No current facility-administered medications for this encounter.   Filed Vitals:   02/18/14 1420  BP: 84/62  Weight: 228 lb 12.8 oz (103.783 kg)    PHYSICAL EXAM: General:  Sitting on exam table. No resp difficulty.  Ambulated in clinic with walker.  HEENT: normal Neck: supple. JVP 10  Carotids 2+ bilaterally; no bruits. No lymphadenopathy or thryomegaly appreciated. Cor: PMI normal. Irregular 2/6 TR Lungs: barrel chested. diminshed throughout fine crackles in bases Abdomen: soft, nontender, nondistended. No hepatosplenomegaly. No bruits or masses. Good bowel sounds. Extremities: no cyanosis, clubbing, rash, 2+ bilateral edema with cool extremities Neuro: alert & orientedx3, cranial nerves grossly intact. Moves all 4 extremities w/o difficulty. Affect pleasant.   ASSESSMENT & PLAN:  1. Biventricular HF (R>L)  NICM,  EF 40-45%, TAPSE < 1.0 (09/2013).  --NYHA III. Suspect RV failure/cor pulmonale and COPD are main issues.  --Volume status remains elevated. Add spiro 25 daily. Decrease KCL to 20 daily. Check BMET 1 week. Continue torsemide 40/20 and metolazone M,W,F  -- Start wearing compression stockings.  -- Reinforced the need and importance of daily weights, a low sodium diet, and fluid restriction (less than 2 L a day). Instructed to call the HF clinic if weight increases more than 3 lbs overnight or 5 lbs in a week.  -- BMET 1 week -- Repeat RHC as needed 2. Severe COPD with cor pulmonale --needs small tank so he can wear O2 at all times --refer to pulmonary, Dr.  [place] --refer to pulmonary rehab 3 Chronic A fib s/p MAZE: failed DC-CV (09/2013). - no longer on amiodarone. Remains on eliquis with no bleeding issues. Cut back on coreg today to 12.5 mg BID. 4 Spinal Stenosis- follow by [facility]   Glori Bickers MD  2:31 PM

## 2014-02-18 NOTE — Telephone Encounter (Signed)
[place] pt would like to change from you to BQ?  Please advise. thanks

## 2014-02-19 NOTE — Telephone Encounter (Signed)
Fine by me 

## 2014-02-19 NOTE — Telephone Encounter (Signed)
lmomtcb x1 for pt 

## 2014-02-20 NOTE — Telephone Encounter (Signed)
Spoke with patient-he states BQ was the name of the physician he was given by Dr Vaughan Browner and wants to see BQ on that note. I have scheduled patient to see BQ in [place] office on Friday 03-13-14 at 10:30am-listed as a consult to give enough time for patient and MD. Nothing more needed at this time.

## 2014-02-25 ENCOUNTER — Ambulatory Visit (HOSPITAL_COMMUNITY)
Admission: RE | Admit: 2014-02-25 | Discharge: 2014-02-25 | Disposition: A | Discharge status: Home / Self Care | Payer: BC Managed Care – PPO | Source: Ambulatory Visit | Attending: Cardiovascular Disease | Admitting: Cardiovascular Disease

## 2014-02-25 DIAGNOSIS — I672 Cerebral atherosclerosis: Secondary | ICD-10-CM | POA: Diagnosis present

## 2014-02-25 DIAGNOSIS — I6529 Occlusion and stenosis of unspecified carotid artery: Secondary | ICD-10-CM

## 2014-02-25 NOTE — Progress Notes (Signed)
Carotid Duplex Completed. °Brianna L Mazza,RVT °

## 2014-02-26 IMAGING — CR DG CHEST 1V PORT
1 series · 1 of 1 positions shown · non-contrast
Comparison: 10/07/2012

CLINICAL DATA: Status post cardiac surgery

PORTABLE CHEST - 1 VIEW

[AP]
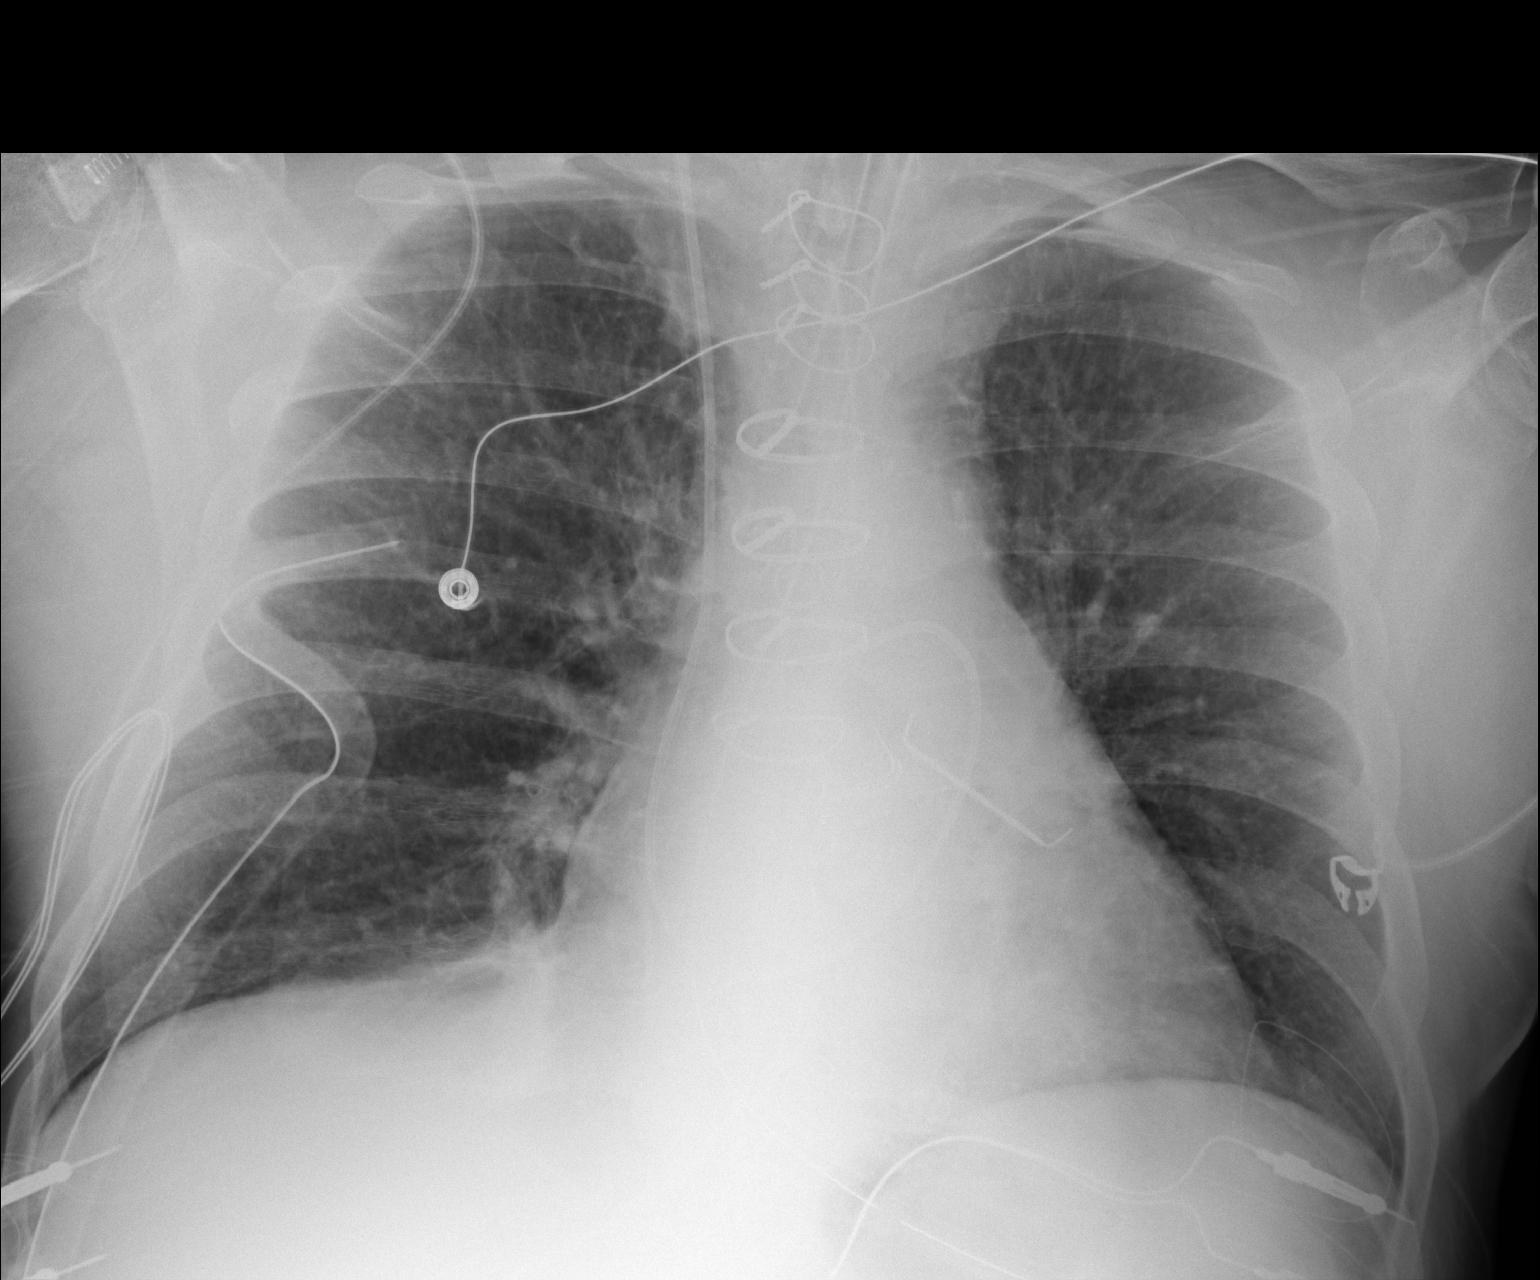

[1 of 1 positions shown; findings below may reference images not displayed]

FINDINGS: The cardiac shadow is stable.  The endotracheal tube and
nasogastric catheter are seen in satisfactory position.  The Swan-
Ganz catheter is noted in the right pulmonary artery.  A left-sided
thoracostomy catheter and mediastinal drain are noted.  The left
atrial clip is seen. The lungs are clear.  No pneumothorax is
noted.
IMPRESSION: Postsurgical changes with tubes and lines as described.  No acute
abnormality is noted.

## 2014-02-27 IMAGING — DX DG CHEST 1V PORT
1 series · 1 of 1 positions shown · non-contrast
Comparison: 10/08/2012

CLINICAL DATA: Coronary bypass graft surgery postop

PORTABLE CHEST - 1 VIEW

[portable]
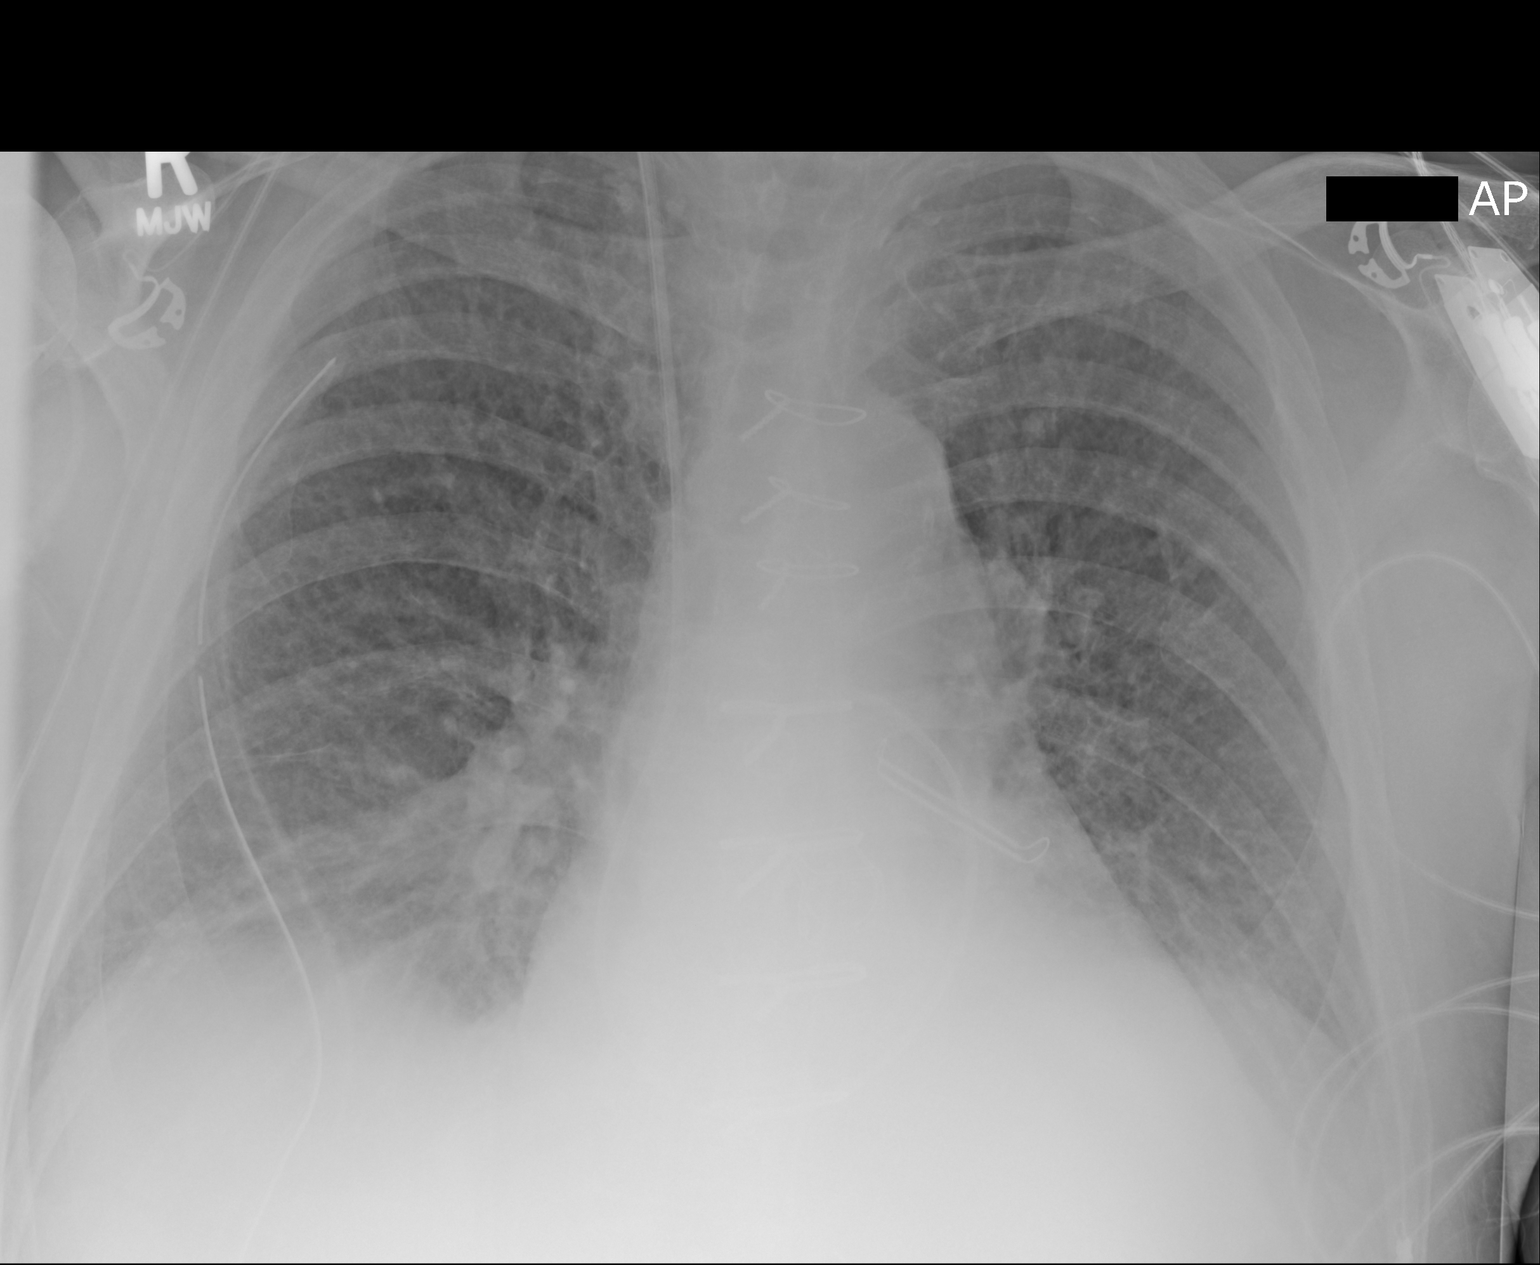

[1 of 1 positions shown; findings below may reference images not displayed]

FINDINGS: Endotracheal tube has been removed.  Swan-Ganz catheter
tip in the right main pulmonary artery.  Right chest tube in place.
No pneumothorax.

Increased pulmonary edema bilaterally.  Increased bibasilar
atelectasis.
IMPRESSION: Increase in pulmonary edema and bibasilar atelectasis following
extubation.

## 2014-02-28 IMAGING — DX DG CHEST 1V PORT
1 series · 1 of 1 positions shown · non-contrast
Comparison: 10/09/2012

CLINICAL DATA: Check chest tube.  Postop.

PORTABLE CHEST - 1 VIEW

[portable]
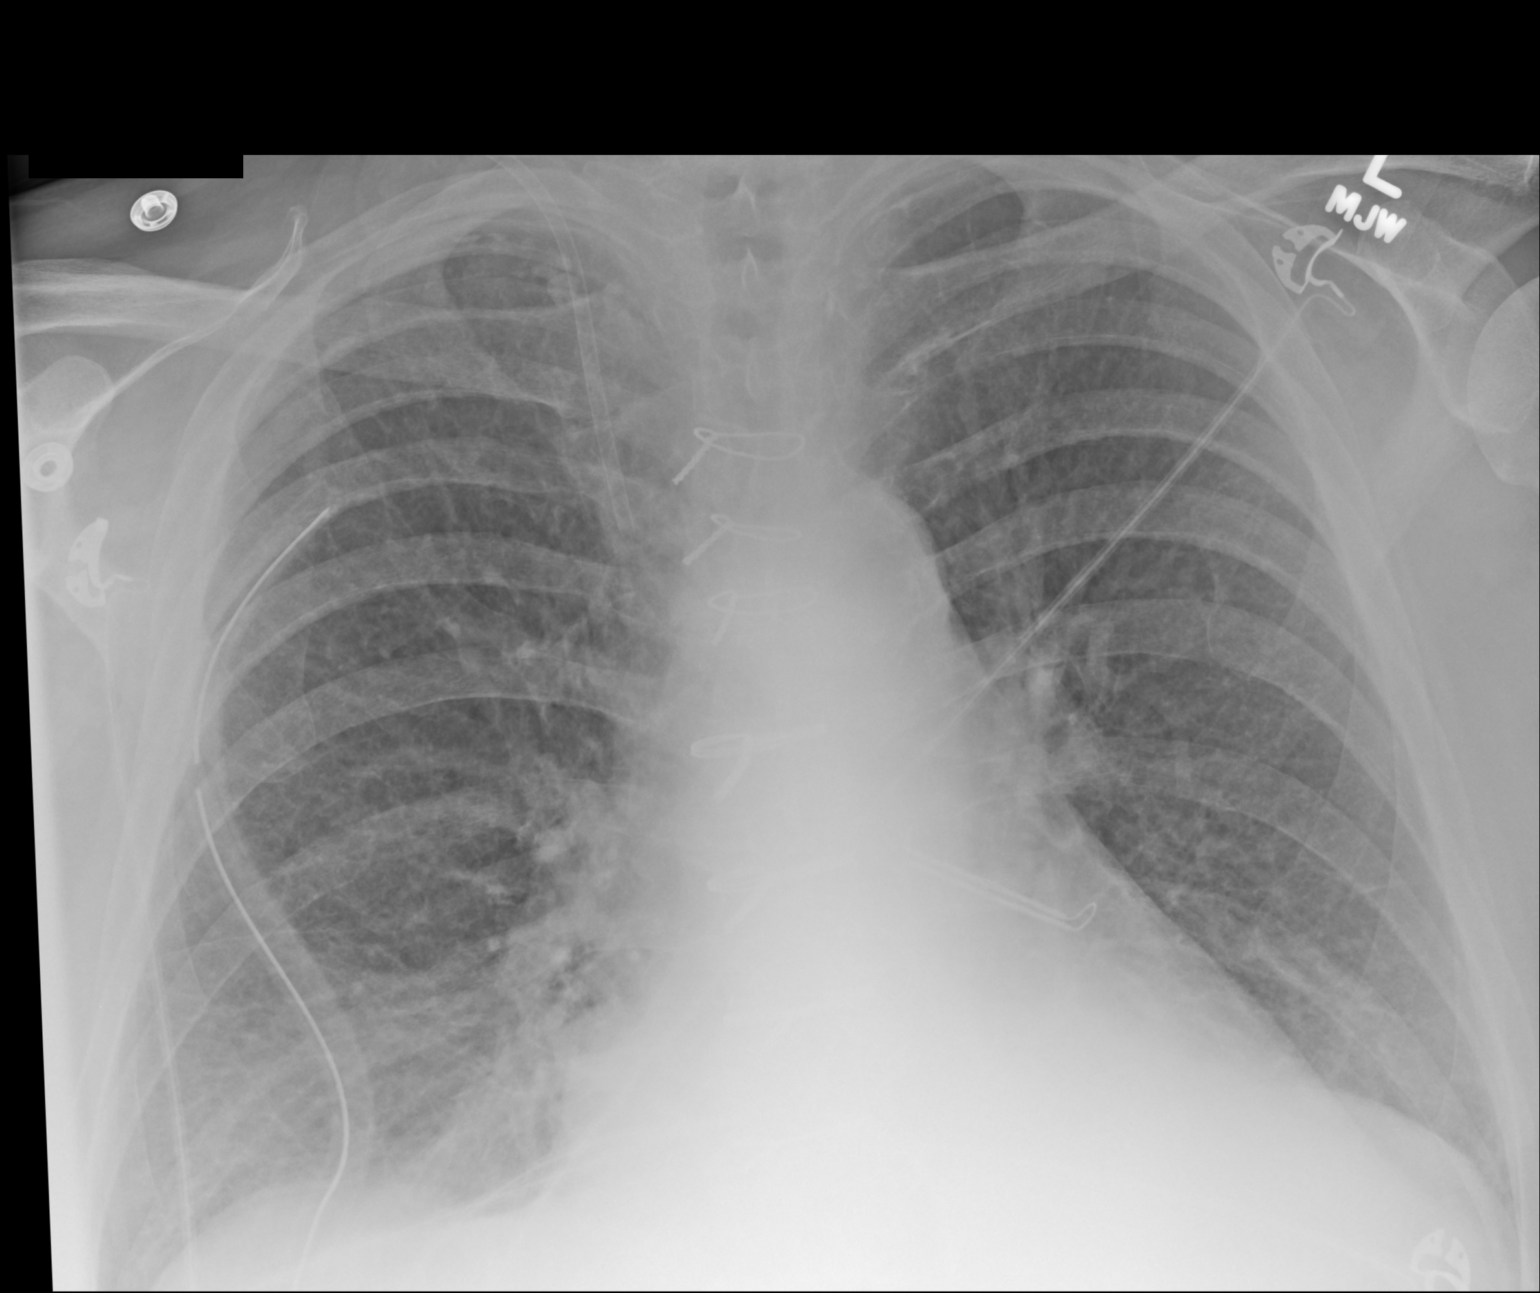

[1 of 1 positions shown; findings below may reference images not displayed]

FINDINGS: Stable position of the right chest tube.  No significant
pneumothorax.  Stable hazy densities at the right lung base.  Swan-
Ganz catheter has been removed but the right jugular central line
is still present.  Stable appearance of the heart with postsurgical
changes.
IMPRESSION: Stable right chest tube.  No significant pneumothorax.

Right basilar densities are most compatible with atelectasis.

## 2014-03-01 IMAGING — DX DG CHEST 1V PORT
2 series · 2 of 2 positions shown · non-contrast
Comparison: 10/10/2012

CLINICAL DATA: Status post CABG.

PORTABLE CHEST - 1 VIEW

[portable (1 of 2)]
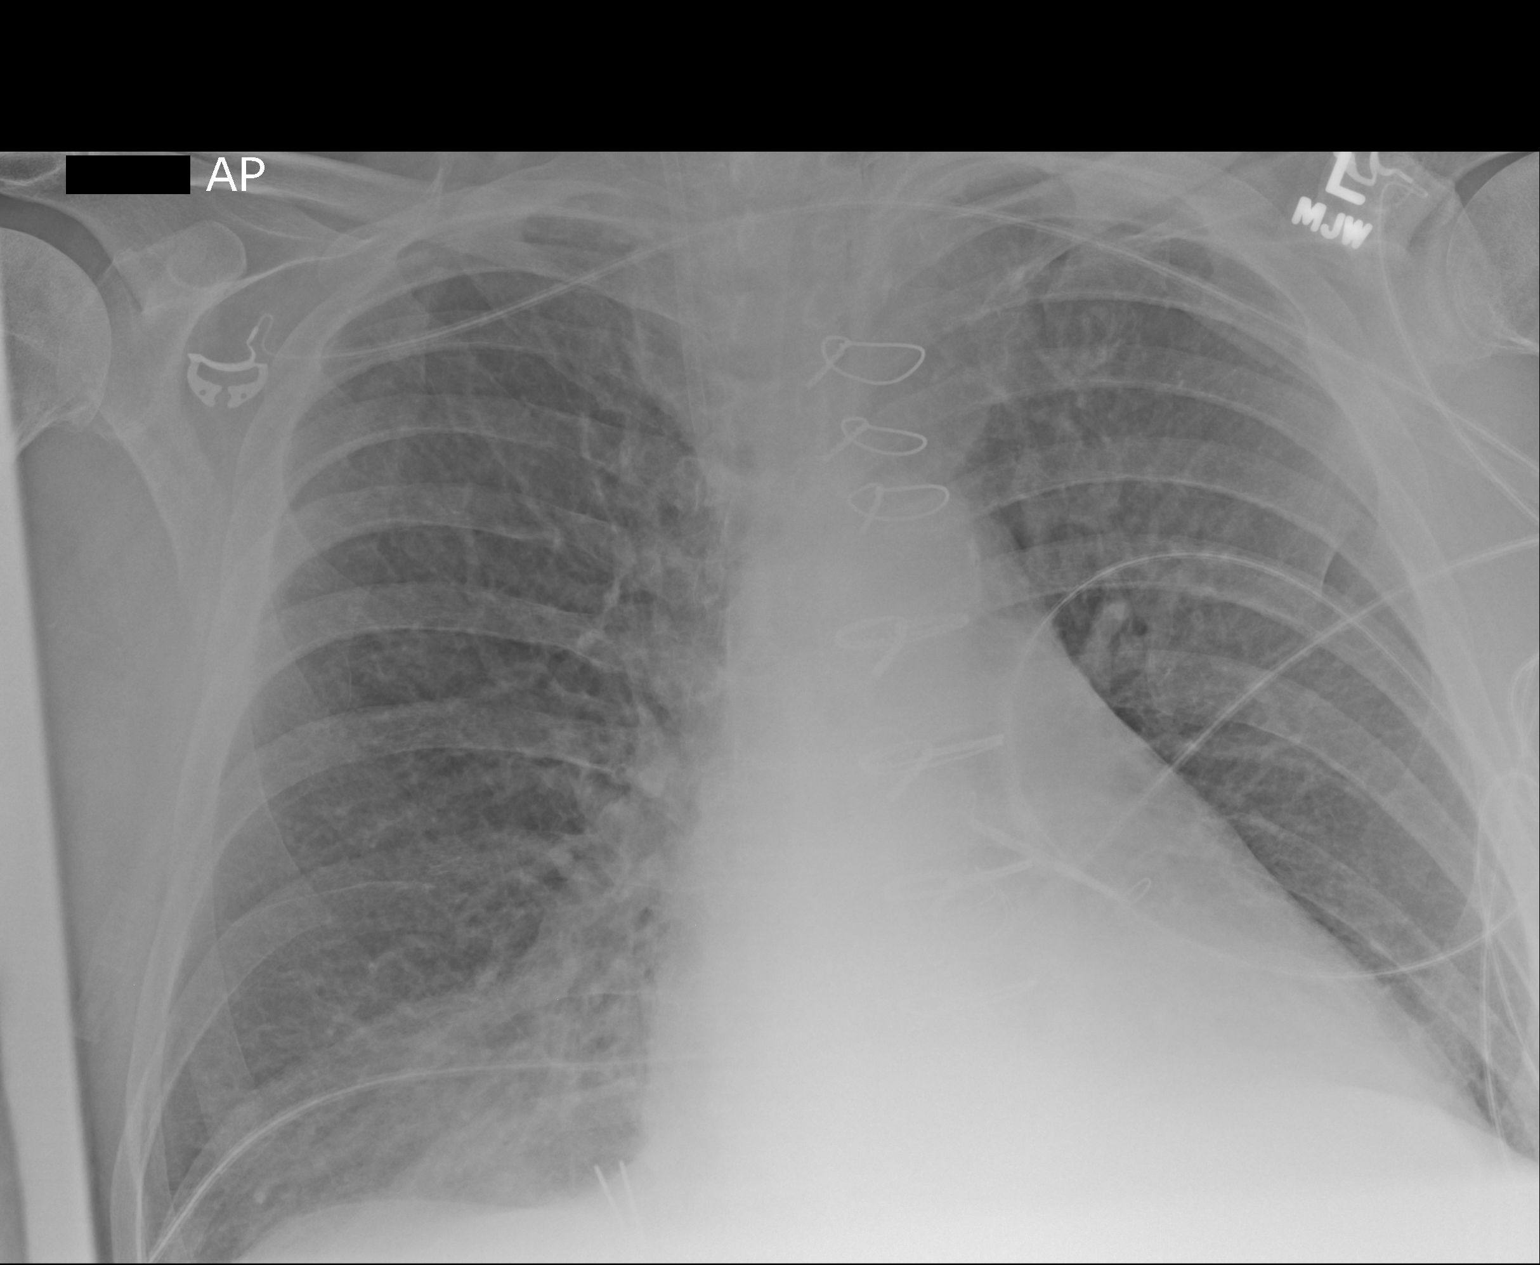

[portable (2 of 2)]
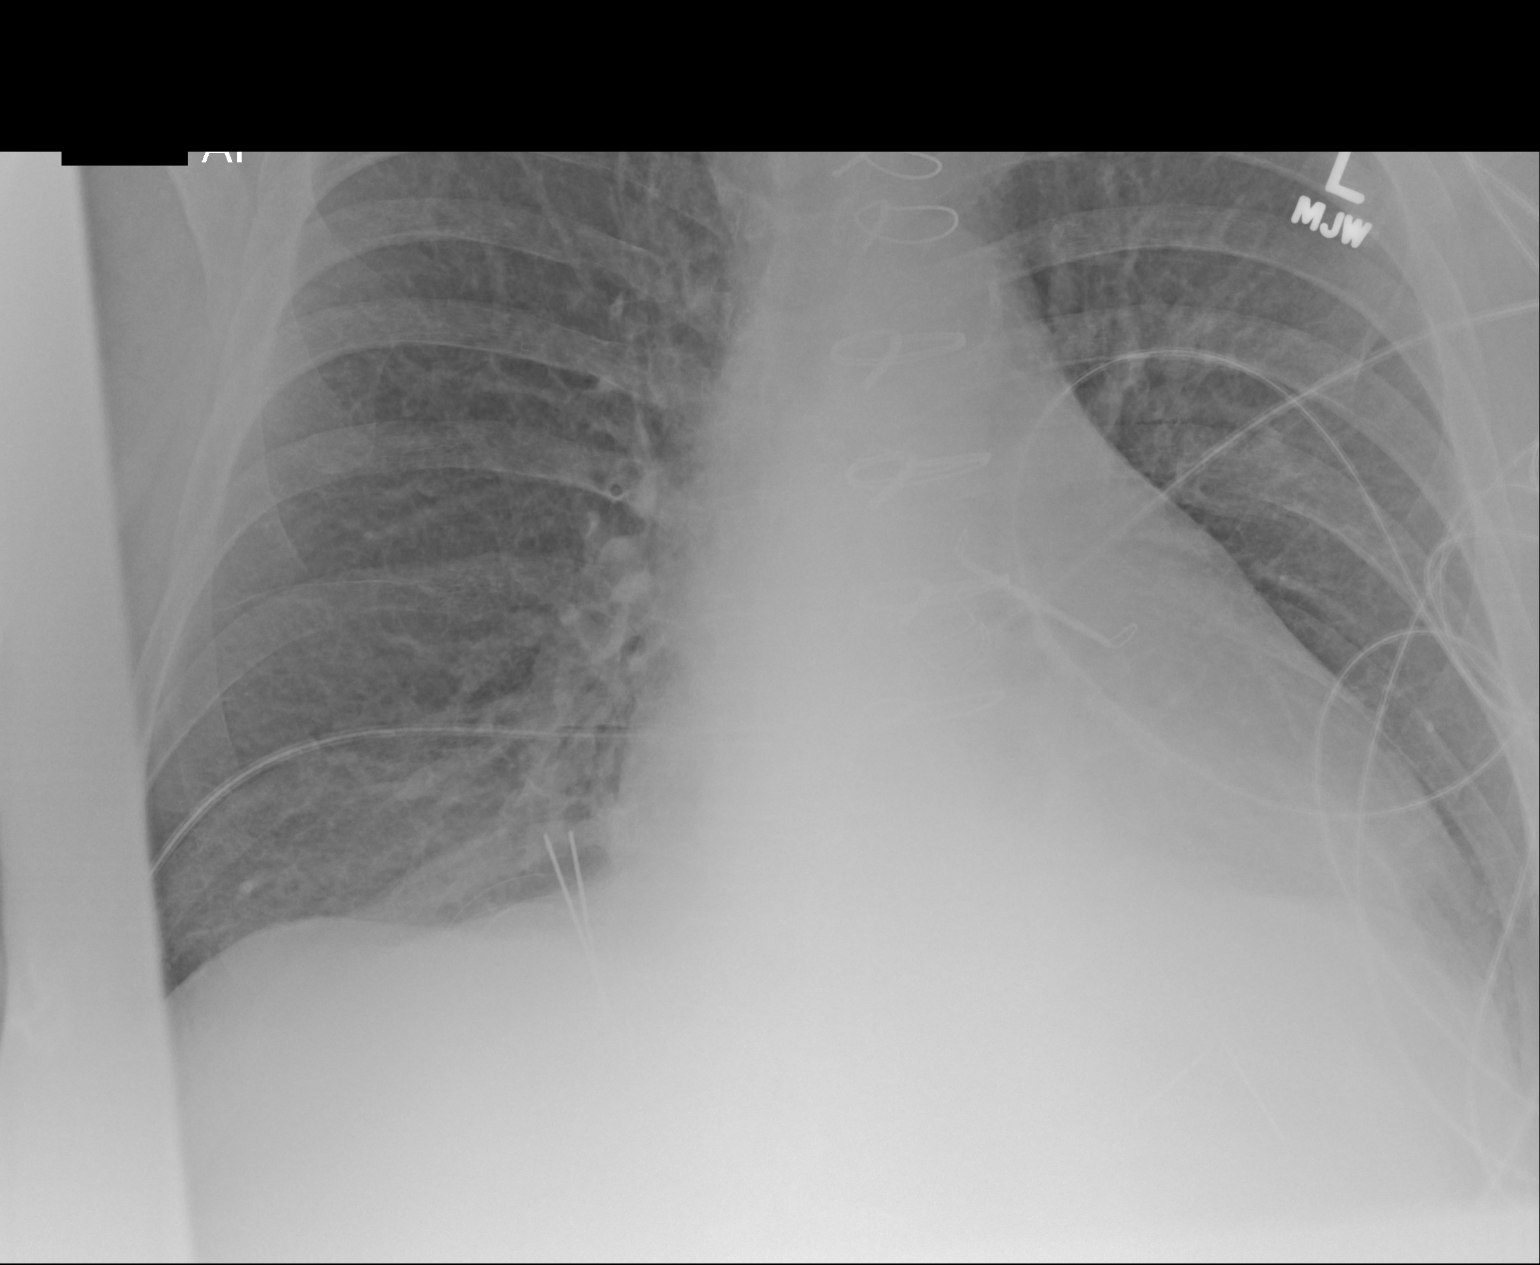

[2 of 2 positions shown; findings below may reference images not displayed]

FINDINGS: The right chest tube has been removed.  No pneumothorax
is identified.  Stable mild interstitial edema is present.
Bilateral lower lobe atelectasis is stable.  The heart size is
stable.
IMPRESSION: No pneumothorax after chest tube removal.  Mild interstitial edema
is present.

## 2014-03-04 ENCOUNTER — Ambulatory Visit (INDEPENDENT_AMBULATORY_CARE_PROVIDER_SITE_OTHER): Payer: BC Managed Care – PPO | Admitting: Cardiovascular Disease

## 2014-03-04 ENCOUNTER — Encounter: Payer: Self-pay | Admitting: Cardiovascular Disease

## 2014-03-04 VITALS — BP 102/62 | HR 110 | Ht 70.0 in | Wt 231.2 lb

## 2014-03-04 DIAGNOSIS — I2583 Coronary atherosclerosis due to lipid rich plaque: Principal | ICD-10-CM

## 2014-03-04 DIAGNOSIS — Z79899 Other long term (current) drug therapy: Secondary | ICD-10-CM

## 2014-03-04 DIAGNOSIS — I739 Peripheral vascular disease, unspecified: Secondary | ICD-10-CM

## 2014-03-04 DIAGNOSIS — I779 Disorder of arteries and arterioles, unspecified: Secondary | ICD-10-CM | POA: Insufficient documentation

## 2014-03-04 DIAGNOSIS — I2510 Atherosclerotic heart disease of native coronary artery without angina pectoris: Secondary | ICD-10-CM

## 2014-03-04 DIAGNOSIS — I6529 Occlusion and stenosis of unspecified carotid artery: Secondary | ICD-10-CM

## 2014-03-04 DIAGNOSIS — E785 Hyperlipidemia, unspecified: Secondary | ICD-10-CM

## 2014-03-04 MED ORDER — ATORVASTATIN CALCIUM 80 MG PO TABS: 80.0000 mg | ORAL_TABLET | Freq: Every day | ORAL | Status: DC

## 2014-03-04 NOTE — Assessment & Plan Note (Signed)
Chronic atrial fibrillation status post surgical maze procedure and failed cardioversions multiple times currently rate controlled on  Eliquis oral anticoagulation. Continue current therapy. His heart rate is mildly elevated in the 120 range. His blood pressure does not allow adjusting his beta blocker.

## 2014-03-04 NOTE — Patient Instructions (Signed)
  We will see you back in follow up in 6 months with Dr Gwenlyn Found  Dr Gwenlyn Found has ordered: 1. Your physician recommends that you return for a FASTING lipid profile  2. Carotid Duplex (to be done in 6 months)- This test is an ultrasound of the carotid arteries in your neck. It looks at blood flow through these arteries that supply the brain with blood. Allow one hour for this exam. There are no restrictions or special instructions.  3. Increase Atorvastatin to 80mg  daily ( I have sent the prescription into the pharmacy for you)

## 2014-03-04 NOTE — Assessment & Plan Note (Signed)
On atorvastatin 40 mg daily with a recent cholesterol from 12/31/13 revealing a total cholesterol 197, LDL 106 and HDL of 73. I'm going to increase his atorvastatin to 80 mg and will recheck.

## 2014-03-04 NOTE — Assessment & Plan Note (Signed)
Bilateral carotid artery stenosis by recent Ultrasound. He has moderate narrowing on the right and moderately severe on the left. We will repeat this in 6 months. He is currently neurologically symptomatic. Should this progress to the point of any repeat revascularization we will entertain the possibility of carotid artery stenting.

## 2014-03-04 NOTE — Assessment & Plan Note (Signed)
History of CAD and aortic stenosis status post coronary artery bypass grafting and tissue AVR June 2014. We have been following this by 2-D echocardiography.[name]

## 2014-03-04 NOTE — Assessment & Plan Note (Signed)
The patient is on diuretic therapy. He weighs himself daily. He is aware of restriction. He sees Dr. Haroldine Laws in the congestive heart failure clinic on a frequent basis who is following him carefully.

## 2014-03-04 NOTE — Progress Notes (Signed)
03/04/2014 [name]   Sep 10, 1950  [id number]  Primary Physician Merrilee Seashore, MD Primary Cardiologist: Lorretta Harp MD [name]   HPI:   62 y/o with a history of CAD and AS. He had CABG X2 with tissue AVR June 2014. He had post op PAF and had DCCV but didn't hold and he was placed on Amiodarone. His follow up was inconsistent secondary to financial issues. He presented in May 2015 with respiratory failure and acute on chronic diastolic CHF. He was found to be septic with blood cultures positive for Listeria. He failed to hold NSR after another DCCV and it was decided to abandon NSR and leave him in AF with plans for rate control. He was not a candidate for any invasive procedures secondary to sepsis. He was discharge 09/30/13. He has tapered off his steroids and and his ABs now stopped. He is now off the amiodarone as well. He had home health RN and physical therapy which have now been completed. And he has followed up in the heart failure clinic the end of June.  He did have an elevated dig level and his dig was held and has been restarted every other day for heart rate control. Today he looks very well some shortness of breath he does have oxygen at home. No fevers continues with back pain but is able to ambulate much better now than it discharge from the hospital he does use his walker. He saw Dr. Nelva Bush who  entertained doing a lumbar steroid injections however this would require discontinuation of his oral" which I'm a little hesitant to do.he had carotid Dopplers performed office that showed moderate progression of both internal carotid arteries left greater than right. He is not astigmatic. We'll recheck this in 6 months. He does wear home O2 and appears to be euvolemic today. Dr. Haroldine Laws follow same in the congestive heart failure clinic.   Current Outpatient Prescriptions  Medication Sig Dispense Refill  . apixaban (ELIQUIS) 5 MG TABS tablet Take 1 tablet  (5 mg total) by mouth 2 (two) times daily. 180 tablet 3  . atorvastatin (LIPITOR) 80 MG tablet Take 1 tablet (80 mg total) by mouth daily at 6 PM. 30 tablet 6  . carvedilol (COREG) 12.5 MG tablet Take 2 tablets (25 mg total) by mouth 2 (two) times daily with a meal. 30 tablet 3  . digoxin (LANOXIN) 0.25 MG tablet Take 1 tablet (0.25 mg total) by mouth every other day. 30 tablet 6  . folic acid (FOLVITE) 1 MG tablet Take 1 tablet (1 mg total) by mouth daily. 30 tablet 6  . gabapentin (NEURONTIN) 100 MG capsule Take 300 mg by mouth 3 (three) times daily.     . metolazone (ZAROXOLYN) 2.5 MG tablet Take 2.5 mg by mouth. Monday, Wednesday,Friday    . OXYGEN-HELIUM IN Inhale 3 L into the lungs continuous. Is in the habit of not using his oxygen when he leaves the house    . potassium chloride SA (K-DUR,KLOR-CON) 20 MEQ tablet Take 1 tablet (20 mEq total) by mouth daily. 60 tablet 6  . spironolactone (ALDACTONE) 25 MG tablet Take 1 tablet (25 mg total) by mouth daily. 30 tablet 3  . tiZANidine (ZANAFLEX) 4 MG tablet Take 8 mg by mouth 2 (two) times daily.    Marland Kitchen torsemide (DEMADEX) 20 MG tablet Take 40 mg (2 tabs) in the morning and 20 mg (1 tab) in the evening 90 tablet 3   No current  facility-administered medications for this visit.    No Known Allergies  History   Social History  . Marital Status: Divorced    Spouse Name: N/A    Number of Children: N/A  . Years of Education: N/A   Occupational History  . retired    Social History Main Topics  . Smoking status: Former Smoker -- 1.00 packs/day for 43 years    Types: Cigarettes    Quit date: 09/12/2012  . Smokeless tobacco: Never Used     Comment: More than 43 + pack years as he smoked up to 2 1/2 ppd for many years. Had a period of cessation after femoral stent.  . Alcohol Use: 25.2 - 50.4 oz/week    42-84 Cans of beer per week     Comment: 6-12 cans of beer a day  . Drug Use: No  . Sexual Activity: Not on file   Other Topics Concern   . Not on file   Social History Narrative     Review of Systems: General: negative for chills, fever, night sweats or weight changes.  Cardiovascular: negative for chest pain, dyspnea on exertion, edema, orthopnea, palpitations, paroxysmal nocturnal dyspnea or shortness of breath Dermatological: negative for rash Respiratory: negative for cough or wheezing Urologic: negative for hematuria Abdominal: negative for nausea, vomiting, diarrhea, bright red blood per rectum, melena, or hematemesis Neurologic: negative for visual changes, syncope, or dizziness All other systems reviewed and are otherwise negative except as noted above.    Blood pressure 102/62, pulse 110, height 5\' 10"  (1.778 m), weight 231 lb 3.2 oz (104.872 kg).  General appearance: alert and no distress Neck: no adenopathy, no JVD, supple, symmetrical, trachea midline, thyroid not enlarged, symmetric, no tenderness/mass/nodules and soft bilateral carotid bruits Lungs: clear to auscultation bilaterally Heart: irregularly irregular rhythm Extremities: extremities normal, atraumatic, no cyanosis or edema  EKG not performed today  ASSESSMENT AND PLAN:   Chronic a-fib- failed Amio-DCCV  Chronic atrial fibrillation status post surgical maze procedure and failed cardioversions multiple times currently rate controlled on  Eliquis oral anticoagulation. Continue current therapy. His heart rate is mildly elevated in the 120 range. His blood pressure does not allow adjusting his beta blocker.  Acute combined systolic and diastolic CHF - EF  75-64% June 2015  The patient is on diuretic therapy. He weighs himself daily. He is aware of restriction. He sees Dr. Haroldine Laws in the congestive heart failure clinic on a frequent basis who is following him carefully.  Hyperlipidemia On atorvastatin 40 mg daily with a recent cholesterol from 12/31/13 revealing a total cholesterol 197, LDL 106 and HDL of 73. I'm going to increase his  atorvastatin to 80 mg and will recheck.  CAD, CABG X 2 with SVG-PD/PL 5/14 History of CAD and aortic stenosis status post coronary artery bypass grafting and tissue AVR June 2014. We have been following this by 2-D echocardiography..  Bilateral carotid artery disease Bilateral carotid artery stenosis by recent Ultrasound. He has moderate narrowing on the right and moderately severe on the left. We will repeat this in 6 months. He is currently neurologically symptomatic. Should this progress to the point of any repeat revascularization we will entertain the possibility of carotid artery stenting.      Lorretta Harp MD FACP,FACC,FAHA, [facility] 03/04/2014 2:43 PM

## 2014-03-13 ENCOUNTER — Ambulatory Visit (INDEPENDENT_AMBULATORY_CARE_PROVIDER_SITE_OTHER): Payer: BC Managed Care – PPO | Admitting: Pulmonary Disease

## 2014-03-13 ENCOUNTER — Encounter: Payer: Self-pay | Admitting: Pulmonary Disease

## 2014-03-13 VITALS — BP 142/86 | HR 112 | Ht 69.0 in | Wt 230.0 lb

## 2014-03-13 DIAGNOSIS — J438 Other emphysema: Secondary | ICD-10-CM

## 2014-03-13 MED ORDER — AEROCHAMBER MV MISC: Status: DC

## 2014-03-13 MED ORDER — ALBUTEROL SULFATE HFA 108 (90 BASE) MCG/ACT IN AERS: 2.0000 | INHALATION_SPRAY | Freq: Four times a day (QID) | RESPIRATORY_TRACT | Status: AC | PRN

## 2014-03-13 MED ORDER — BUDESONIDE-FORMOTEROL FUMARATE 80-4.5 MCG/ACT IN AERO: 2.0000 | INHALATION_SPRAY | Freq: Two times a day (BID) | RESPIRATORY_TRACT | Status: DC

## 2014-03-13 NOTE — Assessment & Plan Note (Signed)
COPD: GOLD Grade D based on severe airflow obstruction and severe symptoms  Combined recommendations from the [employer], [employer], Investment banker, corporate, [place] (Qaseem A et al, Ann Intern Med. 2011;155(3):179) recommends tobacco cessation, pulmonary rehab (for symptomatic patients with an FEV1 < 50% predicted), supplemental oxygen (for patients with SaO2 <88% or paO2 <55), and appropriate bronchodilator therapy.  In regards to long acting bronchodilators, they recommend monotherapy (FEV1 60-80% with symptoms weak evidence, FEV1 with symptoms <60% strong evidence), or combination therapy (FEV1 <60% with symptoms, strong recommendation, moderate evidence).  One should also provide patients with annual immunizations and consider therapy for prevention of COPD exacerbations (ie. roflumilast or azithromycin) when appopriate.  Cornelis's biggest problem is that he currently does not take bronchodilator therapy and he is profoundly deconditioned.  -O2 therapy: Continue 3 L continuously -Immunizations: Flu UTD, Prevnar today -Tobacco use: Quit 2014 -Exercise: He would benefit from regular exercise but is limited by chronic back pain, would prefer to start pulmonary rehab if he can tolerate it (only went to one session of cardiac rehab) -Bronchodilator therapy: Add symbicort twice a day with a spacer, as needed albuterol -Exacerbation prevention: Not indicated

## 2014-03-13 NOTE — Progress Notes (Signed)
Subjective:    Patient ID: [name], male    DOB: 11/19/1950, 63 y.o.   MRN: [id number]  HPI   This is a very pleasant 63 year old male with a past medical history significant for combined heart failure as well as severe COPD who comes to see me today for the latter. He has a history of an aortic valve replacement in 2014 which is at what time he quit smoking. Up until that point he had smoked 2 packs of cigarettes daily for 40 years. He was diagnosed as COPD in 2014 and it was recommended that he take Symbicort twice a day but he did not continue this therapy. He has been hospitalized multiple times in the last year for CHF exacerbations. He has an LVEF of 45% range but worsened RV this function which has been attributed to his severe COPD. In 2015 he most recently had a hospitalization for CHF exacerbation in June and he was started on continuous oxygen therapy at that time. Since then he has not been prescribed a bronchodilator.  He tells me that he first learned that he had COPD 4-5 years ago but nothing was ever really done about it. He did have lung function tests in 2014 which confirmed severe airflow obstruction. Apparently he took Symbicort for about a month and he said that it helps but he never had the prescription filled. Since then he has not been taking any inhaled bronchodilators. He states that he does not get bronchitis and he has never been hospitalized solely for a lung problem. He says that he does not have to go to the doctor frequently for bronchitis and he has never been treated with prednisone or steroids for a lung problem.  He has chronic back pain and he does not participate in exercise on a regular basis. Apparently he only went to cardiac rehabilitation one time.  Past Medical History  Diagnosis Date  . Hypertension   . Gout   . Aortic stenosis     echo 06/19/08 with nomal LV function, moderate concentric LVH moderate aortic stenosis area 0.99 cm squared, peak  gradient of 50 and mean of 31  . Hyperlipidemia   . Atrial fibrillation with RVR, was in atrial fib in 04/2011 09/16/2012    a) s/p MAZE (09/2012)   . Tobacco use 09/16/2012  . Heart murmur   . Chronic combined systolic and diastolic CHF (congestive heart failure) 09/16/2012    a) ECHO (08/2013) EF 50-55%, grade II DD b) TEE ECHO (09/2013): 40-45%, bioprosthesis present, mild MR  . Aortic stenosis, severe 09/23/2012  . CAD (coronary artery disease), single vessel disease 09/23/2012    a) CABG (SVG to PDA and PL) wtih AVR (09/2012)   . PAD (peripheral artery disease), decreased bil. ABIs 09/23/2012    a) ABIs (11/2013): Right mild arterial insufficiency, L normal; aroto-bifem bypass graft: not well visualized, bilateral SFAs = to greater than 50% in dimaeter reduction   . Gout flare. 09/29/12, Lt knee, improved with colchicine. 09/30/2012  . H/O aortic valve replacement   . AS (aortic stenosis) with AVR with pericardial tissue valve  09/30/2013  . Back pain, spinal stenosis 09/30/2013  . COPD (chronic obstructive pulmonary disease)   . Bilateral carotid artery disease      Family History  Problem Relation Age of Onset  . Heart attack Father     died at 18 with MI  . Heart disease Brother   . Heart attack Brother   .  Heart attack Brother      History   Social History  . Marital Status: Divorced    Spouse Name: N/A    Number of Children: N/A  . Years of Education: N/A   Occupational History  . retired    Social History Main Topics  . Smoking status: Former Smoker -- 1.00 packs/day for 43 years    Types: Cigarettes    Quit date: 09/12/2012  . Smokeless tobacco: Never Used     Comment: More than 43 + pack years as he smoked up to 2 1/2 ppd for many years. Had a period of cessation after femoral stent.  . Alcohol Use: 25.2 - 50.4 oz/week    42-84 Cans of beer per week     Comment: 6-12 cans of beer a day  . Drug Use: No  . Sexual Activity: Not on file   Other Topics Concern  . Not on file    Social History Narrative     No Known Allergies   Outpatient Prescriptions Prior to Visit  Medication Sig Dispense Refill  . apixaban (ELIQUIS) 5 MG TABS tablet Take 1 tablet (5 mg total) by mouth 2 (two) times daily. 180 tablet 3  . atorvastatin (LIPITOR) 80 MG tablet Take 1 tablet (80 mg total) by mouth daily at 6 PM. 30 tablet 6  . carvedilol (COREG) 12.5 MG tablet Take 2 tablets (25 mg total) by mouth 2 (two) times daily with a meal. 30 tablet 3  . digoxin (LANOXIN) 0.25 MG tablet Take 1 tablet (0.25 mg total) by mouth every other day. 30 tablet 6  . folic acid (FOLVITE) 1 MG tablet Take 1 tablet (1 mg total) by mouth daily. 30 tablet 6  . gabapentin (NEURONTIN) 100 MG capsule Take 300 mg by mouth 3 (three) times daily.     . metolazone (ZAROXOLYN) 2.5 MG tablet Take 2.5 mg by mouth. Monday, Wednesday,Friday    . OXYGEN-HELIUM IN Inhale 3 L into the lungs continuous. Is in the habit of not using his oxygen when he leaves the house    . potassium chloride SA (K-DUR,KLOR-CON) 20 MEQ tablet Take 1 tablet (20 mEq total) by mouth daily. 60 tablet 6  . spironolactone (ALDACTONE) 25 MG tablet Take 1 tablet (25 mg total) by mouth daily. 30 tablet 3  . tiZANidine (ZANAFLEX) 4 MG tablet Take 8 mg by mouth 2 (two) times daily.    Marland Kitchen torsemide (DEMADEX) 20 MG tablet Take 40 mg (2 tabs) in the morning and 20 mg (1 tab) in the evening 90 tablet 3   No facility-administered medications prior to visit.        Review of Systems  Constitutional: Negative for fever and unexpected weight change.  HENT: Negative for congestion, dental problem, ear pain, nosebleeds, postnasal drip, rhinorrhea, sinus pressure, sneezing, sore throat and trouble swallowing.   Eyes: Negative for redness and itching.  Respiratory: Positive for shortness of breath. Negative for cough, chest tightness and wheezing.   Cardiovascular: Negative for palpitations and leg swelling.  Gastrointestinal: Negative for nausea and  vomiting.  Genitourinary: Negative for dysuria.  Musculoskeletal: Negative for joint swelling.  Skin: Negative for rash.  Neurological: Negative for headaches.  Hematological: Does not bruise/bleed easily.  Psychiatric/Behavioral: Negative for dysphoric mood. The patient is not nervous/anxious.        Objective:   Physical Exam Filed Vitals:   03/13/14 1020  BP: 142/86  Pulse: 112  Height: 5\' 9"  (1.753 m)  Weight: 230  lb (104.327 kg)  SpO2: 97%   3 L   Walked 500 feet on 3L and his O2 saturation was 99-100%  Gen: chronically ill appearing, no acute distress HEENT: NCAT, PERRL, EOMi, OP clear, neck supple without masses PULM: Crackles in bases, no wheezing CV: RRR, systolic murmur noted, no JVD AB: BS+, soft, nontender, no hsm Ext: warm, no edema, no clubbing, no cyanosis Derm: no rash or skin breakdown Neuro: A&Ox4, CN II-XII intact, strength 5/5 in all 4 extremities       Assessment & Plan:   COPD on home oxygen COPD: GOLD Grade D based on severe airflow obstruction and severe symptoms  Combined recommendations from the [place], [employer]icians, Investment banker, corporate, European Respiratory Society (Qaseem A et al, Ann Intern Med. 2011;155(3):179) recommends tobacco cessation, pulmonary rehab (for symptomatic patients with an FEV1 < 50% predicted), supplemental oxygen (for patients with SaO2 <88% or paO2 <55), and appropriate bronchodilator therapy.  In regards to long acting bronchodilators, they recommend monotherapy (FEV1 60-80% with symptoms weak evidence, FEV1 with symptoms <60% strong evidence), or combination therapy (FEV1 <60% with symptoms, strong recommendation, moderate evidence).  One should also provide patients with annual immunizations and consider therapy for prevention of COPD exacerbations (ie. roflumilast or azithromycin) when appopriate.  Shaine's biggest problem is that he currently does not take  bronchodilator therapy and he is profoundly deconditioned.  -O2 therapy: Continue 3 L continuously -Immunizations: Flu UTD, Prevnar today -Tobacco use: Quit 2014 -Exercise: He would benefit from regular exercise but is limited by chronic back pain, would prefer to start pulmonary rehab if he can tolerate it (only went to one session of cardiac rehab) -Bronchodilator therapy: Add symbicort twice a day with a spacer, as needed albuterol -Exacerbation prevention: Not indicated   Chronic hypoxemic respiratory failure: Today in the office his O2 saturation was noted to be 99-100% on 3 L O2. This seems a bit much. I would like to test him on his home device with ambulatory oximetry monitoring before we make any changes. We may actually need to decrease the amount of oxygen that he uses.  Updated Medication List Outpatient Encounter Prescriptions as of 03/13/2014  Medication Sig  . apixaban (ELIQUIS) 5 MG TABS tablet Take 1 tablet (5 mg total) by mouth 2 (two) times daily.  Marland Kitchen atorvastatin (LIPITOR) 80 MG tablet Take 1 tablet (80 mg total) by mouth daily at 6 PM.  . carvedilol (COREG) 12.5 MG tablet Take 2 tablets (25 mg total) by mouth 2 (two) times daily with a meal.  . digoxin (LANOXIN) 0.25 MG tablet Take 1 tablet (0.25 mg total) by mouth every other day.  . folic acid (FOLVITE) 1 MG tablet Take 1 tablet (1 mg total) by mouth daily.  Marland Kitchen gabapentin (NEURONTIN) 100 MG capsule Take 300 mg by mouth 3 (three) times daily.   . metolazone (ZAROXOLYN) 2.5 MG tablet Take 2.5 mg by mouth. Monday, Wednesday,Friday  . OXYGEN-HELIUM IN Inhale 3 L into the lungs continuous. Is in the habit of not using his oxygen when he leaves the house  . potassium chloride SA (K-DUR,KLOR-CON) 20 MEQ tablet Take 1 tablet (20 mEq total) by mouth daily.  Marland Kitchen spironolactone (ALDACTONE) 25 MG tablet Take 1 tablet (25 mg total) by mouth daily.  Marland Kitchen tiZANidine (ZANAFLEX) 4 MG tablet Take 8 mg by mouth 2 (two) times daily.  Marland Kitchen  torsemide (DEMADEX) 20 MG tablet Take 40 mg (2 tabs) in the morning and  20 mg (1 tab) in the evening  . albuterol (PROVENTIL HFA;VENTOLIN HFA) 108 (90 BASE) MCG/ACT inhaler Inhale 2 puffs into the lungs every 6 (six) hours as needed for wheezing or shortness of breath.  . budesonide-formoterol (SYMBICORT) 80-4.5 MCG/ACT inhaler Inhale 2 puffs into the lungs 2 (two) times daily.  Marland Kitchen Spacer/Aero-Holding Chambers (AEROCHAMBER MV) inhaler Use as instructed

## 2014-03-13 NOTE — Patient Instructions (Signed)
Take the symbicort 2 puffs twice a day no matter how you feel; use this with a spacer Use albuterol 2 puffs every four hours as needed for shortness of breath Use your oxygen every day as you are doing You really need to exercise regularly, if your pain improves we can refer you to pulmonary rehab  We will see you back in 6 weeks or sooner if needed

## 2014-03-23 ENCOUNTER — Ambulatory Visit (HOSPITAL_COMMUNITY)
Admission: RE | Admit: 2014-03-23 | Discharge: 2014-03-23 | Disposition: A | Discharge status: Home / Self Care | Payer: BC Managed Care – PPO | Source: Ambulatory Visit | Attending: Internal Medicine | Admitting: Internal Medicine

## 2014-03-23 ENCOUNTER — Encounter (HOSPITAL_COMMUNITY): Payer: Self-pay

## 2014-03-23 VITALS — BP 128/78 | HR 123 | Wt 227.0 lb

## 2014-03-23 DIAGNOSIS — I4892 Unspecified atrial flutter: Secondary | ICD-10-CM | POA: Insufficient documentation

## 2014-03-23 DIAGNOSIS — E785 Hyperlipidemia, unspecified: Secondary | ICD-10-CM | POA: Diagnosis not present

## 2014-03-23 DIAGNOSIS — M4800 Spinal stenosis, site unspecified: Secondary | ICD-10-CM | POA: Insufficient documentation

## 2014-03-23 DIAGNOSIS — I5042 Chronic combined systolic (congestive) and diastolic (congestive) heart failure: Secondary | ICD-10-CM

## 2014-03-23 DIAGNOSIS — J449 Chronic obstructive pulmonary disease, unspecified: Secondary | ICD-10-CM | POA: Insufficient documentation

## 2014-03-23 DIAGNOSIS — Z79899 Other long term (current) drug therapy: Secondary | ICD-10-CM | POA: Insufficient documentation

## 2014-03-23 DIAGNOSIS — Z87891 Personal history of nicotine dependence: Secondary | ICD-10-CM | POA: Insufficient documentation

## 2014-03-23 DIAGNOSIS — I2781 Cor pulmonale (chronic): Secondary | ICD-10-CM | POA: Diagnosis not present

## 2014-03-23 DIAGNOSIS — M109 Gout, unspecified: Secondary | ICD-10-CM | POA: Insufficient documentation

## 2014-03-23 DIAGNOSIS — Z951 Presence of aortocoronary bypass graft: Secondary | ICD-10-CM | POA: Diagnosis not present

## 2014-03-23 DIAGNOSIS — I350 Nonrheumatic aortic (valve) stenosis: Secondary | ICD-10-CM | POA: Insufficient documentation

## 2014-03-23 DIAGNOSIS — I10 Essential (primary) hypertension: Secondary | ICD-10-CM | POA: Insufficient documentation

## 2014-03-23 DIAGNOSIS — I2510 Atherosclerotic heart disease of native coronary artery without angina pectoris: Secondary | ICD-10-CM | POA: Insufficient documentation

## 2014-03-23 DIAGNOSIS — I482 Chronic atrial fibrillation, unspecified: Secondary | ICD-10-CM

## 2014-03-23 DIAGNOSIS — Z7901 Long term (current) use of anticoagulants: Secondary | ICD-10-CM | POA: Diagnosis not present

## 2014-03-23 DIAGNOSIS — R011 Cardiac murmur, unspecified: Secondary | ICD-10-CM | POA: Insufficient documentation

## 2014-03-23 DIAGNOSIS — F1010 Alcohol abuse, uncomplicated: Secondary | ICD-10-CM | POA: Diagnosis not present

## 2014-03-23 DIAGNOSIS — Z952 Presence of prosthetic heart valve: Secondary | ICD-10-CM | POA: Diagnosis not present

## 2014-03-23 DIAGNOSIS — J432 Centrilobular emphysema: Secondary | ICD-10-CM

## 2014-03-23 DIAGNOSIS — I5041 Acute combined systolic (congestive) and diastolic (congestive) heart failure: Secondary | ICD-10-CM

## 2014-03-23 DIAGNOSIS — I429 Cardiomyopathy, unspecified: Secondary | ICD-10-CM | POA: Insufficient documentation

## 2014-03-23 DIAGNOSIS — I739 Peripheral vascular disease, unspecified: Secondary | ICD-10-CM | POA: Diagnosis not present

## 2014-03-23 LAB — BASIC METABOLIC PANEL
Anion gap: 12 (ref 5–15)
BUN: 39 mg/dL — AB (ref 6–23)
CALCIUM: 9.3 mg/dL (ref 8.4–10.5)
CO2: 35 meq/L — AB (ref 19–32)
CREATININE: 1.62 mg/dL — AB (ref 0.50–1.35)
Chloride: 97 mEq/L (ref 96–112)
GFR calc Af Amer: 50 mL/min — ABNORMAL LOW (ref >90)
GFR calc non Af Amer: 43 mL/min — ABNORMAL LOW (ref >90)
GLUCOSE: 92 mg/dL (ref 70–99)
Potassium: 4.1 mEq/L (ref 3.7–5.3)
Sodium: 144 mEq/L (ref 137–147)

## 2014-03-23 NOTE — Progress Notes (Signed)
Patient ID: [name], male   DOB: 1950-09-19, 63 y.o.   MRN: [id number]  EP: Dr Lovena Le  Primary Cardiologist: Dr Gwenlyn Found  PCP : None Orthopedic: Dr Waynetta Pean   HPI: [name] is 63 y.o. male with past medical history of severe aortic stenosis s/p tissue AV replacement (09/2012) atrial fibrillation s/p MAZE (09/2012)  chronic combined systolic/diastolic HF, RV failure, HTN, PAD s/p L iliac stent in 2010 and aortobifemoral bypass graft, ETOH abuse, severe COPD with cor pulmonale, CAD s/p CABG with AV replacement and back pain from spinal stenosis.   Admitted and treated for acute failure complicated by listeria bacteremia and Afib RVR 09/2013. Diuresed with IV lasix transitioned to po lasix.He had TEE DC-CV but went back into afib with controlled rate on amiodarone and eliquis. He was treated with 2 weeks of antibiotics. Discharge weight was 226 pounds.   Follow up for Heart Failure: At last visit spiro 25 daily added. Breathing much better. Has follow up with Dr Lake Bells. Wearing 3 liters [place] continuously. Denies SOB with exertion. Occasionally dizzy. Says its better with oxygen.  Ambulating with walker. Weight at home 224 pounds.   ECHO 30-35% 08/2012  ECHO 08/2013 EF 50-55% Grade II DD  TEE- EF 40-45%   PFTs 9/14 FEV1 1.1L (34%) FVC 2.57L (57%) FEF 25-75% (17%) DLCO 41%  SH: Former Biochemist, clinical. Quit smoking 1 year ago. Drink alcohol occasionally . Denies drug use. Lives alone  FH: Father died MI at 52 Brother MI    ROS: All systems negative except as listed in HPI, PMH and Problem List.  Past Medical History  Diagnosis Date  . Hypertension   . Gout   . Aortic stenosis     echo 06/19/08 with nomal LV function, moderate concentric LVH moderate aortic stenosis area 0.99 cm squared, peak gradient of 50 and mean of 31  . Hyperlipidemia   . Atrial fibrillation with RVR, was in atrial fib in 04/2011 09/16/2012    a) s/p MAZE (09/2012)   . Tobacco use 09/16/2012  . Heart murmur   . Chronic  combined systolic and diastolic CHF (congestive heart failure) 09/16/2012    a) ECHO (08/2013) EF 50-55%, grade II DD b) TEE ECHO (09/2013): 40-45%, bioprosthesis present, mild Dustin  . Aortic stenosis, severe 09/23/2012  . CAD (coronary artery disease), single vessel disease 09/23/2012    a) CABG (SVG to PDA and PL) wtih AVR (09/2012)   . PAD (peripheral artery disease), decreased bil. ABIs 09/23/2012    a) ABIs (11/2013): Right mild arterial insufficiency, L normal; aroto-bifem bypass graft: not well visualized, bilateral SFAs = to greater than 50% in dimaeter reduction   . Gout flare. 09/29/12, Lt knee, improved with colchicine. 09/30/2012  . H/O aortic valve replacement   . AS (aortic stenosis) with AVR with pericardial tissue valve  09/30/2013  . Back pain, spinal stenosis 09/30/2013  . COPD (chronic obstructive pulmonary disease)   . Bilateral carotid artery disease     Current Outpatient Prescriptions  Medication Sig Dispense Refill  . albuterol (PROVENTIL HFA;VENTOLIN HFA) 108 (90 BASE) MCG/ACT inhaler Inhale 2 puffs into the lungs every 6 (six) hours as needed for wheezing or shortness of breath. 1 Inhaler 2  . apixaban (ELIQUIS) 5 MG TABS tablet Take 1 tablet (5 mg total) by mouth 2 (two) times daily. 180 tablet 3  . atorvastatin (LIPITOR) 80 MG tablet Take 1 tablet (80 mg total) by mouth daily at 6 PM. 30 tablet 6  .  budesonide-formoterol (SYMBICORT) 80-4.5 MCG/ACT inhaler Inhale 2 puffs into the lungs 2 (two) times daily. 1 Inhaler 12  . carvedilol (COREG) 12.5 MG tablet Take 2 tablets (25 mg total) by mouth 2 (two) times daily with a meal. (Patient taking differently: Take 12.5 mg by mouth 2 (two) times daily with a meal. ) 30 tablet 3  . digoxin (LANOXIN) 0.25 MG tablet Take 1 tablet (0.25 mg total) by mouth every other day. 30 tablet 6  . folic acid (FOLVITE) 1 MG tablet Take 1 tablet (1 mg total) by mouth daily. 30 tablet 6  . gabapentin (NEURONTIN) 100 MG capsule Take 300 mg by mouth 3 (three)  times daily.     . metolazone (ZAROXOLYN) 2.5 MG tablet Take 2.5 mg by mouth. Monday, Wednesday,Friday    . OXYGEN-HELIUM IN Inhale 3 L into the lungs continuous. Is in the habit of not using his oxygen when he leaves the house    . potassium chloride SA (K-DUR,KLOR-CON) 20 MEQ tablet Take 1 tablet (20 mEq total) by mouth daily. 60 tablet 6  . Spacer/Aero-Holding Chambers (AEROCHAMBER MV) inhaler Use as instructed 1 each 0  . spironolactone (ALDACTONE) 25 MG tablet Take 1 tablet (25 mg total) by mouth daily. 30 tablet 3  . tiZANidine (ZANAFLEX) 4 MG tablet Take 8 mg by mouth 2 (two) times daily.    Marland Kitchen torsemide (DEMADEX) 20 MG tablet Take 40 mg (2 tabs) in the morning and 20 mg (1 tab) in the evening 90 tablet 3   No current facility-administered medications for this encounter.   Filed Vitals:   03/23/14 1443  BP: 128/78  Pulse: 123  Weight: 227 lb (102.967 kg)  SpO2: 95%    PHYSICAL EXAM: General:  Sitting on exam table. No resp difficulty.  Ambulated in clinic with walker. Brother present HEENT: normal Neck: supple. JVP ~7   Carotids 2+ bilaterally; no bruits. No lymphadenopathy or thryomegaly appreciated. Cor: PMI normal. Regular tachycardia. Irregular 2/6 TR Lungs: barrel chested. diminshed throughout fine crackles in bases Abdomen: soft, nontender, nondistended. No hepatosplenomegaly. No bruits or masses. Good bowel sounds. Extremities: no cyanosis, clubbing, rash, no lower extremity edema.  Neuro: alert & orientedx3, cranial nerves grossly intact. Moves all 4 extremities w/o difficulty. Affect pleasant.  EKG: A flutter versus A tach  117 bpm    ASSESSMENT & PLAN:  1. Biventricular HF (R>L)  NICM,  EF 40-45%, TAPSE < 1.0 (09/2013).  --NYHA III. Suspect RV failure/cor pulmonale and COPD are main issues.  --Volume status stable. Continue spiro 25 daily. Continue 20 meq Kdur daily.  Continue torsemide 40/20 and metolazone M,W,F  --Continue digoxin 0.25 mg daily.  -- Reinforced  the need and importance of daily weights, a low sodium diet, and fluid restriction (less than 2 L a day). Instructed to call the HF clinic if weight increases more than 3 lbs overnight or 5 lbs in a week.  -- Repeat RHC as needed 2. Severe COPD with cor pulmonale --Using continuous oxygen.  --has referral to  Dr. Lake Bells --referred to pulmonary rehab 3 Chronic A fib s/p MAZE: Failed DC-CV x2 in November 2014.  failed DC-CV (09/2013). - no longer on amiodarone. Remains on eliquis with no bleeding issues. Cut back on coreg today to 12.5 mg BID. 4 Spinal Stenosis- follow by [facility] 5 A flutter versus A tach- Discussed options such as medications, DC-CV, versus ablation. Today he is agreeable to referral to Dr Lovena Le.     Follow up in 6  weeks.   CLEGG,AMY NP-C  2:49 PM  Patient seen and examined with Darrick Grinder, NP. We discussed all aspects of the encounter. I agree with the assessment and plan as stated above.   Doing well from volume standpoint. Will continue current diuretic regimen. Pending referral to Pulmonary Rehab. Main issue now is his rhythm. ECG today looks like AFL vs atrial tach with RVR. In looking back at his EGCs this is same rhythm he had in June and failed DC-CV. He has not been able to be rate controlled. I have reached out to Dr. Lovena Le to see if he might be able to able this rhythm. If not, may need to consider AVN ablation with CRT. Repeat labs today.   Taquisha Phung,MD 3:59 PM

## 2014-03-23 NOTE — Patient Instructions (Signed)
Lab today  You have been referred to Dr Lovena Le  Your physician recommends that you schedule a follow-up appointment in: 6 weeks

## 2014-04-02 ENCOUNTER — Encounter: Payer: Self-pay | Admitting: Internal Medicine

## 2014-04-02 ENCOUNTER — Encounter (HOSPITAL_COMMUNITY): Payer: Self-pay | Admitting: Cardiovascular Disease

## 2014-04-07 ENCOUNTER — Emergency Department (HOSPITAL_COMMUNITY): Payer: BC Managed Care – PPO

## 2014-04-07 ENCOUNTER — Other Ambulatory Visit: Payer: Self-pay

## 2014-04-07 ENCOUNTER — Encounter (HOSPITAL_COMMUNITY): Payer: Self-pay | Admitting: Emergency Medicine

## 2014-04-07 ENCOUNTER — Inpatient Hospital Stay (HOSPITAL_COMMUNITY)
Admission: EM | Admit: 2014-04-07 | Discharge: 2014-04-13 | DRG: 065 | Disposition: A | Discharge status: Rehabilitation Facility | Payer: BC Managed Care – PPO | Source: Intra-hospital | Attending: Neurology | Admitting: Neurology

## 2014-04-07 ENCOUNTER — Ambulatory Visit (HOSPITAL_BASED_OUTPATIENT_CLINIC_OR_DEPARTMENT_OTHER)
Admission: RE | Admit: 2014-04-07 | Discharge: 2014-04-07 | Disposition: A | Discharge status: Home / Self Care | Payer: BC Managed Care – PPO | Source: Ambulatory Visit | Attending: Cardiology | Admitting: Cardiology

## 2014-04-07 ENCOUNTER — Other Ambulatory Visit (HOSPITAL_COMMUNITY): Payer: Self-pay

## 2014-04-07 DIAGNOSIS — E871 Hypo-osmolality and hyponatremia: Secondary | ICD-10-CM | POA: Diagnosis not present

## 2014-04-07 DIAGNOSIS — I350 Nonrheumatic aortic (valve) stenosis: Secondary | ICD-10-CM | POA: Diagnosis present

## 2014-04-07 DIAGNOSIS — Z87891 Personal history of nicotine dependence: Secondary | ICD-10-CM

## 2014-04-07 DIAGNOSIS — Z7901 Long term (current) use of anticoagulants: Secondary | ICD-10-CM | POA: Diagnosis not present

## 2014-04-07 DIAGNOSIS — I5042 Chronic combined systolic (congestive) and diastolic (congestive) heart failure: Secondary | ICD-10-CM | POA: Diagnosis present

## 2014-04-07 DIAGNOSIS — M109 Gout, unspecified: Secondary | ICD-10-CM | POA: Diagnosis present

## 2014-04-07 DIAGNOSIS — Z8249 Family history of ischemic heart disease and other diseases of the circulatory system: Secondary | ICD-10-CM

## 2014-04-07 DIAGNOSIS — I619 Nontraumatic intracerebral hemorrhage, unspecified: Secondary | ICD-10-CM | POA: Diagnosis present

## 2014-04-07 DIAGNOSIS — I69954 Hemiplegia and hemiparesis following unspecified cerebrovascular disease affecting left non-dominant side: Secondary | ICD-10-CM

## 2014-04-07 DIAGNOSIS — Z952 Presence of prosthetic heart valve: Secondary | ICD-10-CM | POA: Diagnosis not present

## 2014-04-07 DIAGNOSIS — R208 Other disturbances of skin sensation: Secondary | ICD-10-CM | POA: Diagnosis not present

## 2014-04-07 DIAGNOSIS — E785 Hyperlipidemia, unspecified: Secondary | ICD-10-CM | POA: Diagnosis present

## 2014-04-07 DIAGNOSIS — I610 Nontraumatic intracerebral hemorrhage in hemisphere, subcortical: Secondary | ICD-10-CM | POA: Diagnosis not present

## 2014-04-07 DIAGNOSIS — I2510 Atherosclerotic heart disease of native coronary artery without angina pectoris: Secondary | ICD-10-CM | POA: Diagnosis present

## 2014-04-07 DIAGNOSIS — I618 Other nontraumatic intracerebral hemorrhage: Secondary | ICD-10-CM | POA: Diagnosis not present

## 2014-04-07 DIAGNOSIS — I10 Essential (primary) hypertension: Secondary | ICD-10-CM | POA: Diagnosis present

## 2014-04-07 DIAGNOSIS — I480 Paroxysmal atrial fibrillation: Secondary | ICD-10-CM | POA: Diagnosis not present

## 2014-04-07 DIAGNOSIS — Z951 Presence of aortocoronary bypass graft: Secondary | ICD-10-CM | POA: Diagnosis not present

## 2014-04-07 DIAGNOSIS — I4891 Unspecified atrial fibrillation: Secondary | ICD-10-CM | POA: Diagnosis present

## 2014-04-07 DIAGNOSIS — R29810 Facial weakness: Secondary | ICD-10-CM | POA: Diagnosis not present

## 2014-04-07 DIAGNOSIS — R4781 Slurred speech: Secondary | ICD-10-CM | POA: Diagnosis present

## 2014-04-07 DIAGNOSIS — J449 Chronic obstructive pulmonary disease, unspecified: Secondary | ICD-10-CM | POA: Diagnosis present

## 2014-04-07 DIAGNOSIS — N179 Acute kidney failure, unspecified: Secondary | ICD-10-CM | POA: Diagnosis not present

## 2014-04-07 DIAGNOSIS — I6789 Other cerebrovascular disease: Secondary | ICD-10-CM | POA: Diagnosis not present

## 2014-04-07 DIAGNOSIS — R531 Weakness: Secondary | ICD-10-CM | POA: Diagnosis not present

## 2014-04-07 DIAGNOSIS — I69154 Hemiplegia and hemiparesis following nontraumatic intracerebral hemorrhage affecting left non-dominant side: Secondary | ICD-10-CM | POA: Diagnosis not present

## 2014-04-07 DIAGNOSIS — I6522 Occlusion and stenosis of left carotid artery: Secondary | ICD-10-CM | POA: Diagnosis present

## 2014-04-07 DIAGNOSIS — I5022 Chronic systolic (congestive) heart failure: Secondary | ICD-10-CM

## 2014-04-07 DIAGNOSIS — I69398 Other sequelae of cerebral infarction: Secondary | ICD-10-CM | POA: Diagnosis not present

## 2014-04-07 LAB — DIFFERENTIAL
Basophils Absolute: 0 10*3/uL (ref 0.0–0.1)
Basophils Relative: 1 % (ref 0–1)
EOS ABS: 0.1 10*3/uL (ref 0.0–0.7)
EOS PCT: 2 % (ref 0–5)
Lymphocytes Relative: 35 % (ref 12–46)
Lymphs Abs: 2.4 10*3/uL (ref 0.7–4.0)
MONO ABS: 0.7 10*3/uL (ref 0.1–1.0)
MONOS PCT: 11 % (ref 3–12)
Neutro Abs: 3.6 10*3/uL (ref 1.7–7.7)
Neutrophils Relative %: 51 % (ref 43–77)

## 2014-04-07 LAB — RAPID URINE DRUG SCREEN, HOSP PERFORMED
AMPHETAMINES: NOT DETECTED
BENZODIAZEPINES: NOT DETECTED
Barbiturates: NOT DETECTED
COCAINE: NOT DETECTED
OPIATES: NOT DETECTED
Tetrahydrocannabinol: NOT DETECTED

## 2014-04-07 LAB — URINE MICROSCOPIC-ADD ON

## 2014-04-07 LAB — MRSA PCR SCREENING: MRSA BY PCR: NEGATIVE

## 2014-04-07 LAB — COMPREHENSIVE METABOLIC PANEL
ALT: 21 U/L (ref 0–53)
ANION GAP: 12 (ref 5–15)
AST: 33 U/L (ref 0–37)
Albumin: 3.3 g/dL — ABNORMAL LOW (ref 3.5–5.2)
Alkaline Phosphatase: 78 U/L (ref 39–117)
BILIRUBIN TOTAL: 0.4 mg/dL (ref 0.3–1.2)
BUN: 30 mg/dL — AB (ref 6–23)
CALCIUM: 9.1 mg/dL (ref 8.4–10.5)
CO2: 29 mEq/L (ref 19–32)
CREATININE: 1.29 mg/dL (ref 0.50–1.35)
Chloride: 100 mEq/L (ref 96–112)
GFR calc Af Amer: 66 mL/min — ABNORMAL LOW (ref >90)
GFR calc non Af Amer: 57 mL/min — ABNORMAL LOW (ref >90)
Glucose, Bld: 99 mg/dL (ref 70–99)
Potassium: 3.8 mEq/L (ref 3.7–5.3)
Sodium: 141 mEq/L (ref 137–147)
TOTAL PROTEIN: 7.8 g/dL (ref 6.0–8.3)

## 2014-04-07 LAB — CBC
HEMATOCRIT: 35.7 % — AB (ref 39.0–52.0)
HEMOGLOBIN: 10.9 g/dL — AB (ref 13.0–17.0)
MCH: 26.5 pg (ref 26.0–34.0)
MCHC: 30.5 g/dL (ref 30.0–36.0)
MCV: 86.9 fL (ref 78.0–100.0)
Platelets: 215 10*3/uL (ref 150–400)
RBC: 4.11 MIL/uL — ABNORMAL LOW (ref 4.22–5.81)
RDW: 16.7 % — ABNORMAL HIGH (ref 11.5–15.5)
WBC: 6.9 10*3/uL (ref 4.0–10.5)

## 2014-04-07 LAB — DIGOXIN LEVEL: DIGOXIN LVL: 0.7 ng/mL — AB (ref 0.8–2.0)

## 2014-04-07 LAB — I-STAT CHEM 8, ED
BUN: 30 mg/dL — ABNORMAL HIGH (ref 6–23)
CHLORIDE: 100 meq/L (ref 96–112)
Calcium, Ion: 1.11 mmol/L — ABNORMAL LOW (ref 1.13–1.30)
Creatinine, Ser: 1.5 mg/dL — ABNORMAL HIGH (ref 0.50–1.35)
Glucose, Bld: 103 mg/dL — ABNORMAL HIGH (ref 70–99)
HCT: 37 % — ABNORMAL LOW (ref 39.0–52.0)
Hemoglobin: 12.6 g/dL — ABNORMAL LOW (ref 13.0–17.0)
Potassium: 3.5 mEq/L — ABNORMAL LOW (ref 3.7–5.3)
Sodium: 141 mEq/L (ref 137–147)
TCO2: 27 mmol/L (ref 0–100)

## 2014-04-07 LAB — URINALYSIS, ROUTINE W REFLEX MICROSCOPIC
BILIRUBIN URINE: NEGATIVE
GLUCOSE, UA: NEGATIVE mg/dL
Ketones, ur: NEGATIVE mg/dL
Leukocytes, UA: NEGATIVE
Nitrite: NEGATIVE
PROTEIN: 30 mg/dL — AB
Specific Gravity, Urine: 1.009 (ref 1.005–1.030)
Urobilinogen, UA: 0.2 mg/dL (ref 0.0–1.0)
pH: 5.5 (ref 5.0–8.0)

## 2014-04-07 LAB — BASIC METABOLIC PANEL
Anion gap: 11 (ref 5–15)
BUN: 31 mg/dL — ABNORMAL HIGH (ref 6–23)
CALCIUM: 8.9 mg/dL (ref 8.4–10.5)
CO2: 27 meq/L (ref 19–32)
Chloride: 102 mEq/L (ref 96–112)
Creatinine, Ser: 1.35 mg/dL (ref 0.50–1.35)
GFR calc Af Amer: 63 mL/min — ABNORMAL LOW (ref >90)
GFR calc non Af Amer: 54 mL/min — ABNORMAL LOW (ref >90)
GLUCOSE: 145 mg/dL — AB (ref 70–99)
Potassium: 4.5 mEq/L (ref 3.7–5.3)
Sodium: 140 mEq/L (ref 137–147)

## 2014-04-07 LAB — APTT: aPTT: 34 seconds (ref 24–37)

## 2014-04-07 LAB — HEPARIN LEVEL (UNFRACTIONATED): Heparin Unfractionated: >2.2 IU/mL — ABNORMAL HIGH (ref 0.30–0.70)

## 2014-04-07 LAB — ETHANOL

## 2014-04-07 LAB — I-STAT TROPONIN, ED: TROPONIN I, POC: 0.01 ng/mL (ref 0.00–0.08)

## 2014-04-07 LAB — PROTIME-INR
INR: 1.15 (ref 0.00–1.49)
Prothrombin Time: 14.9 seconds (ref 11.6–15.2)

## 2014-04-07 LAB — GLUCOSE, CAPILLARY: Glucose-Capillary: 106 mg/dL — ABNORMAL HIGH (ref 70–99)

## 2014-04-07 MED ORDER — PROTHROMBIN COMPLEX CONC HUMAN 500 UNITS IV KIT
4837.0000 [IU] | PACK | Status: AC
  Administered 2014-04-07: 4837 [IU] via INTRAVENOUS
  Filled 2014-04-07: qty 193

## 2014-04-07 MED ORDER — ACETAMINOPHEN 325 MG PO TABS
650.0000 mg | ORAL_TABLET | ORAL | Status: DC | PRN
  Administered 2014-04-09: 650 mg via ORAL
  Filled 2014-04-07: qty 2

## 2014-04-07 MED ORDER — LABETALOL HCL 5 MG/ML IV SOLN
10.0000 mg | INTRAVENOUS | Status: DC | PRN
  Administered 2014-04-07: 20 mg via INTRAVENOUS
  Filled 2014-04-07: qty 4

## 2014-04-07 MED ORDER — SENNOSIDES-DOCUSATE SODIUM 8.6-50 MG PO TABS
1.0000 | ORAL_TABLET | Freq: Two times a day (BID) | ORAL | Status: DC
  Administered 2014-04-07 – 2014-04-13 (×12): 1 via ORAL
  Filled 2014-04-07 (×13): qty 1

## 2014-04-07 MED ORDER — ACETAMINOPHEN 650 MG RE SUPP: 650.0000 mg | RECTAL | Status: DC | PRN

## 2014-04-07 MED ORDER — BUDESONIDE-FORMOTEROL FUMARATE 80-4.5 MCG/ACT IN AERO
2.0000 | INHALATION_SPRAY | Freq: Two times a day (BID) | RESPIRATORY_TRACT | Status: DC
  Administered 2014-04-08 – 2014-04-13 (×11): 2 via RESPIRATORY_TRACT
  Filled 2014-04-07 (×3): qty 6.9

## 2014-04-07 MED ORDER — SODIUM CHLORIDE 0.9 % IV SOLN
INTRAVENOUS | Status: DC
  Administered 2014-04-07 – 2014-04-09 (×2): via INTRAVENOUS

## 2014-04-07 MED ORDER — DIGOXIN 125 MCG PO TABS
0.2500 mg | ORAL_TABLET | ORAL | Status: DC
  Administered 2014-04-07 – 2014-04-13 (×4): 0.25 mg via ORAL
  Filled 2014-04-07 (×2): qty 2
  Filled 2014-04-07: qty 1
  Filled 2014-04-07 (×2): qty 2

## 2014-04-07 MED ORDER — PANTOPRAZOLE SODIUM 40 MG IV SOLR
40.0000 mg | Freq: Every day | INTRAVENOUS | Status: DC
  Administered 2014-04-07 – 2014-04-08 (×2): 40 mg via INTRAVENOUS
  Filled 2014-04-07 (×3): qty 40

## 2014-04-07 MED ORDER — STROKE: EARLY STAGES OF RECOVERY BOOK
Freq: Once | Status: AC
  Administered 2014-04-07: 22:00:00
  Filled 2014-04-07: qty 1

## 2014-04-07 NOTE — ED Notes (Signed)
Notified pt's dtr (with pt's permission) that he is in the hospital.

## 2014-04-07 NOTE — Code Documentation (Signed)
63yo male arriving to [facility] at 39 via [facility].  EMS reports that the patient was LKW at 1535 when he suddenly felt like he was going to pass out.  Patient was with friends who assisted him to the ground.  Left extremities with abrasions.  EMS called and activated Code Stroke for left sided weakness and facial droop.  Patient taken to CT on arrival.  Stroke team at the bedside.  CT showing ICH.  NIHSS 10, see documentation for details and code stroke times.  Patient with left sided weakness, left facial droop, loss of sensation on the left and left sided neglect.  Patient with a h/o atrial fibrillation on Eliquis which he reports that he took today.  Dr. Doy Mince at the bedside.  MD to order anticoagulation reversal protocol.  Bedside handoff with ED RN Hope.

## 2014-04-07 NOTE — ED Provider Notes (Signed)
CSN: [id number]     Arrival date & time 04/07/14  1608 History   First MD Initiated Contact with Patient 04/07/14 1622     Chief Complaint  Patient presents with  . Code Stroke    An emergency department physician performed an initial assessment on this suspected stroke patient at 1610 (pollina).  The history is provided by the EMS personnel, the patient and a friend. The history is limited by the condition of the patient.  Pt was seen at 1615. Per EMS, friends and pt report: pt with sudden onset of lightheadedness, left sided weakness and left sided facial droop. that occurred approximately 1530 PTA. Friends reportedly lowered him to the ground. EMS called Code Stroke en route.   Past Medical History  Diagnosis Date  . Hypertension   . Gout   . Aortic stenosis     echo 06/19/08 with nomal LV function, moderate concentric LVH moderate aortic stenosis area 0.99 cm squared, peak gradient of 50 and mean of 31  . Hyperlipidemia   . Atrial fibrillation with RVR, was in atrial fib in 04/2011 09/16/2012    a) s/p MAZE (09/2012)   . Tobacco use 09/16/2012  . Heart murmur   . Chronic combined systolic and diastolic CHF (congestive heart failure) 09/16/2012    a) ECHO (08/2013) EF 50-55%, grade II DD b) TEE ECHO (09/2013): 40-45%, bioprosthesis present, mild MR  . Aortic stenosis, severe 09/23/2012  . CAD (coronary artery disease), single vessel disease 09/23/2012    a) CABG (SVG to PDA and PL) wtih AVR (09/2012)   . PAD (peripheral artery disease), decreased bil. ABIs 09/23/2012    a) ABIs (11/2013): Right mild arterial insufficiency, L normal; aroto-bifem bypass graft: not well visualized, bilateral SFAs = to greater than 50% in dimaeter reduction   . Gout flare. 09/29/12, Lt knee, improved with colchicine. 09/30/2012  . H/O aortic valve replacement   . AS (aortic stenosis) with AVR with pericardial tissue valve  09/30/2013  . Back pain, spinal stenosis 09/30/2013  . COPD (chronic obstructive pulmonary disease)    . Bilateral carotid artery disease    Past Surgical History  Procedure Laterality Date  . Iliac artery stent  10/20/08    stent to lt iliac  . Aorto bifem bypass  12/18/08    by Dr. Trula Slade  . Coronary artery bypass graft N/A 10/08/2012    Procedure: CORONARY ARTERY BYPASS GRAFTING (CABG);  Surgeon: Grace Isaac, MD;  Location: [place];  Service: Open Heart Surgery;  Laterality: N/A;  Coronary Artery bypass Graft times two utilizing the left greater saphenous vein harvested endoscopically  . Aortic valve replacement N/A 10/08/2012    Procedure: AORTIC VALVE REPLACEMENT (AVR);  Surgeon: Grace Isaac, MD;  Location: [place];  Service: Open Heart Surgery;  Laterality: N/A;  . Maze N/A 10/08/2012    Procedure: MAZE;  Surgeon: Grace Isaac, MD;  Location: [facility];  Service: Open Heart Surgery;  Laterality: N/A;  . Intraoperative transesophageal echocardiogram N/A 10/08/2012    Procedure: INTRAOPERATIVE TRANSESOPHAGEAL ECHOCARDIOGRAM;  Surgeon: Grace Isaac, MD;  Location: [place];  Service: Open Heart Surgery;  Laterality: N/A;  . Endovein harvest of greater saphenous vein Bilateral 10/08/2012    Procedure: ENDOVEIN HARVEST OF GREATER SAPHENOUS VEIN;  Surgeon: Grace Isaac, MD;  Location: [place];  Service: Open Heart Surgery;  Laterality: Bilateral;  . US echocardiography  06/20/2011    mod concentric LVH,LA severely dilated,RA mildly dilated,mod. ca+ of the mitral  apparatus,trace MR,mod. ca+ AOV w/stenosis.  Marland Kitchen Nm myocar perf wall motion  08/25/2008    normal  . Cardioversion N/A 02/25/2013    Procedure: CARDIOVERSION;  Surgeon: Sanda Klein, MD;  Location: [place];  Service: Cardiovascular;  Laterality: N/A;  . Tee without cardioversion N/A 09/25/2013    Procedure: TRANSESOPHAGEAL ECHOCARDIOGRAM (TEE);  Surgeon: Thayer Headings, MD;  Location: [place];  Service: Cardiovascular;  Laterality: N/A;  . Cardioversion N/A 09/25/2013    Procedure: CARDIOVERSION;  Surgeon: Thayer Headings, MD;  Location: [facility] ENDOSCOPY;  Service: Cardiovascular;  Laterality: N/A;  . Left and right heart catheterization with coronary angiogram N/A 09/20/2012    Procedure: LEFT AND RIGHT HEART CATHETERIZATION WITH CORONARY ANGIOGRAM;  Surgeon: Lorretta Harp, MD;  Location: [facility] CATH LAB;  Service: Cardiovascular;  Laterality: N/A;   Family History  Problem Relation Age of Onset  . Heart attack Father     died at 44 with MI  . Heart disease Brother   . Heart attack Brother   . Heart attack Brother    History  Substance Use Topics  . Smoking status: Former Smoker -- 1.00 packs/day for 43 years    Types: Cigarettes    Quit date: 09/12/2012  . Smokeless tobacco: Never Used     Comment: More than 43 + pack years as he smoked up to 2 1/2 ppd for many years. Had a period of cessation after femoral stent.  . Alcohol Use: 25.2 - 50.4 oz/week    42-84 Cans of beer per week     Comment: 6-12 cans of beer a day    Review of Systems  Unable to perform ROS: Acuity of condition      Allergies  Review of patient's allergies indicates no known allergies.  Home Medications   Prior to Admission medications   Medication Sig Start Date End Date Taking? Authorizing Provider  albuterol (PROVENTIL HFA;VENTOLIN HFA) 108 (90 BASE) MCG/ACT inhaler Inhale 2 puffs into the lungs every 6 (six) hours as needed for wheezing or shortness of breath. 03/13/14   Juanito Doom, MD  apixaban (ELIQUIS) 5 MG TABS tablet Take 1 tablet (5 mg total) by mouth 2 (two) times daily. 11/24/13   Lorretta Harp, MD  atorvastatin (LIPITOR) 80 MG tablet Take 1 tablet (80 mg total) by mouth daily at 6 PM. 03/04/14   Lorretta Harp, MD  budesonide-formoterol [facility]) 80-4.5 MCG/ACT inhaler Inhale 2 puffs into the lungs 2 (two) times daily. 03/13/14   Juanito Doom, MD  carvedilol (COREG) 12.5 MG tablet Take 2 tablets (25 mg total) by mouth 2 (two) times daily with a meal. Patient taking differently: Take 12.5 mg  by mouth 2 (two) times daily with a meal.  01/14/14   Rande Brunt, NP  digoxin (LANOXIN) 0.25 MG tablet Take 1 tablet (0.25 mg total) by mouth every other day. 10/16/13   Lorretta Harp, MD  folic acid (FOLVITE) 1 MG tablet Take 1 tablet (1 mg total) by mouth daily. 09/30/13   Isaiah Serge, NP  gabapentin (NEURONTIN) 100 MG capsule Take 300 mg by mouth 3 (three) times daily.     Historical Provider, MD  metolazone (ZAROXOLYN) 2.5 MG tablet Take 2.5 mg by mouth. Monday, Wednesday,Friday 12/16/13   Isaiah Serge, NP  OXYGEN-HELIUM IN Inhale 3 L into the lungs continuous. Is in the habit of not using his oxygen when he leaves the house    Historical Provider, MD  potassium chloride SA (K-DUR,KLOR-CON) 20 MEQ tablet Take 1 tablet (20 mEq total) by mouth daily. 02/18/14   Jolaine Artist, MD  Spacer/Aero-Holding Chambers (AEROCHAMBER MV) inhaler Use as instructed 03/13/14   Juanito Doom, MD  spironolactone (ALDACTONE) 25 MG tablet Take 1 tablet (25 mg total) by mouth daily. 02/18/14   Jolaine Artist, MD  tiZANidine (ZANAFLEX) 4 MG tablet Take 8 mg by mouth 2 (two) times daily.    Historical Provider, MD  torsemide (DEMADEX) 20 MG tablet Take 40 mg (2 tabs) in the morning and 20 mg (1 tab) in the evening 01/29/14   Rande Brunt, NP   BP 158/85 mmHg  Temp(Src) 99.4 F (37.4 C) (Oral)  Resp 25  Ht 5\' 10"  (1.778 m)  Wt 233 lb 0.4 oz (105.699 kg)  BMI 33.44 kg/m2  SpO2 95% Physical Exam  1620: Physical examination:  Nursing notes reviewed; Vital signs and O2 SAT reviewed;  Constitutional: Well developed, Well nourished, Well hydrated, In no acute distress; Head:  Normocephalic, atraumatic; Eyes: EOMI, PERRL, No scleral icterus; ENMT: Mouth and pharynx normal, Mucous membranes moist; Neck: Supple, Full range of motion, No lymphadenopathy; Cardiovascular: Irregular rate and rhythm, No gallop; Respiratory: Breath sounds clear & equal bilaterally, No wheezes.  Speaking full sentences with  ease, Normal respiratory effort/excursion; Chest: Nontender, Movement normal; Abdomen: Soft, Nontender, Nondistended, Normal bowel sounds; Genitourinary: No CVA tenderness; Extremities: Pulses normal, No tenderness, No edema, No calf edema or asymmetry.; Neuro: AA&Ox3, +left facial droop. Speech clear. +subjective decreased sensation left face, LUE, LLE. Strength 5/5 RUE and RLE, no drift. Strength LUE 3/5, LUE 3/5, +drift.; Skin: Color normal, Warm, Dry.   ED Course  Procedures   3220:   Code Stroke called by EMS en route. Code Stroke team at bedside. CT-H with ICH. Code stroke cancelled. Pt on Eliquis, reversal agent ordered by Neuro MD. Neuro MD to admit.       EKG Interpretation None      MDM  MDM Reviewed: previous chart, nursing note and vitals Reviewed previous: labs and ECG Interpretation: labs, ECG, x-ray and CT scan Total time providing critical care: 30-74 minutes. This excludes time spent performing separately reportable procedures and services. Consults: neurology   CRITICAL CARE Performed by: Alfonzo Feller Total critical care time: 35 Critical care time was exclusive of separately billable procedures and treating other patients. Critical care was necessary to treat or prevent imminent or life-threatening deterioration. Critical care was time spent personally by me on the following activities: development of treatment plan with patient and/or surrogate as well as nursing, discussions with consultants, evaluation of patient's response to treatment, examination of patient, obtaining history from patient or surrogate, ordering and performing treatments and interventions, ordering and review of laboratory studies, ordering and review of radiographic studies, pulse oximetry and re-evaluation of patient's condition.    Date: 04/07/2014  Rate: 114  Rhythm: atrial flutter  QRS Axis: normal  Intervals: normal  ST/T Wave abnormalities: nonspecific ST/T changes   Conduction Disutrbances:none  Narrative Interpretation: artifact, baseline wander  Old EKG Reviewed: unchanged; no significant changes from previous EKG dated 03/23/2014.  Results for orders placed or performed during the hospital encounter of 04/07/14  CBC  Result Value Ref Range   WBC 6.9 4.0 - 10.5 K/uL   RBC 4.11 (L) 4.22 - 5.81 MIL/uL   Hemoglobin 10.9 (L) 13.0 - 17.0 g/dL   HCT 35.7 (L) 39.0 - 52.0 %   MCV 86.9 78.0 - 100.0 fL  MCH 26.5 26.0 - 34.0 pg   MCHC 30.5 30.0 - 36.0 g/dL   RDW 16.7 (H) 11.5 - 15.5 %   Platelets 215 150 - 400 K/uL  Differential  Result Value Ref Range   Neutrophils Relative % 51 43 - 77 %   Neutro Abs 3.6 1.7 - 7.7 K/uL   Lymphocytes Relative 35 12 - 46 %   Lymphs Abs 2.4 0.7 - 4.0 K/uL   Monocytes Relative 11 3 - 12 %   Monocytes Absolute 0.7 0.1 - 1.0 K/uL   Eosinophils Relative 2 0 - 5 %   Eosinophils Absolute 0.1 0.0 - 0.7 K/uL   Basophils Relative 1 0 - 1 %   Basophils Absolute 0.0 0.0 - 0.1 K/uL  I-Stat Chem 8, ED  Result Value Ref Range   Sodium 141 137 - 147 mEq/L   Potassium 3.5 (L) 3.7 - 5.3 mEq/L   Chloride 100 96 - 112 mEq/L   BUN 30 (H) 6 - 23 mg/dL   Creatinine, Ser 1.50 (H) 0.50 - 1.35 mg/dL   Glucose, Bld 103 (H) 70 - 99 mg/dL   Calcium, Ion 1.11 (L) 1.13 - 1.30 mmol/L   TCO2 27 0 - 100 mmol/L   Hemoglobin 12.6 (L) 13.0 - 17.0 g/dL   HCT 37.0 (L) 39.0 - 52.0 %  I-Stat Troponin, ED (not at [facility])  Result Value Ref Range   Troponin i, poc 0.01 0.00 - 0.08 ng/mL   Comment 3           Ct Head Wo Contrast 04/07/2014   CLINICAL DATA:  Stroke.  EXAM: CT HEAD WITHOUT CONTRAST  TECHNIQUE: Contiguous axial images were obtained from the base of the skull through the vertex without intravenous contrast.  COMPARISON:  None.  FINDINGS: There is a acute hemorrhage in the posterior RIGHT basal ganglia and posterior limb of the RIGHT internal capsule. Intraparenchymal hematoma measures 15 mm x 23 mm on axial imaging. This measures 25 mm  craniocaudal.  The hemorrhage also extends to the posterior lateral aspect of the RIGHT thalamus. 4 mm of RIGHT to LEFT midline shift. There is intraventricular hemorrhage associated with RIGHT through from the parenchymal hemorrhage. There is underlying atrophy and chronic ischemic white matter disease.  Focal low attenuation is present in the LEFT posterior temporal lobe adjacent to the LEFT lateral ventricle atrium. This is favored to represent asymmetric chronic ischemic white matter disease however on old infarct for even subacute ischemia could have this appearance. Little if any effacement of sulci suggests that this is a chronic finding.  The calvarium is intact. No hydrocephalus. The visible paranasal sinuses are within normal limits. RIGHT septal spur and septal deviation. Mastoid air cells clear. Intracranial atherosclerosis.  IMPRESSION: 1. Acute posterior RIGHT basal ganglia and internal capsule hemorrhage measuring 15 mm x 23 mm. 2. 4 mm of RIGHT to LEFT midline shift. 3. Critical Value/emergent results were called by telephone at the time of interpretation on 04/07/2014 at 4:23 pm to Dr. Doy Mince, who verbally acknowledged these results. 4. Area of low attenuation in the posterior LEFT temporal lobe may represent subacute ischemia but the appearance is more suggestive of either an old infarct or asymmetric chronic ischemic white matter disease.   Electronically Signed   By: Dereck Ligas M.D.   On: 04/07/2014 16:24         Francine Graven, DO 04/10/14 1403

## 2014-04-07 NOTE — ED Notes (Signed)
Pt was with friends when he felt weak on left side and his friends helped lower him to the ground. Pt unable to move left arm and has left sided facial droop. CBG 184, BP 859'C systolic.

## 2014-04-07 NOTE — H&P (Signed)
H&P    Chief Complaint: Code stroke  HPI:                                                                                                                                         [name] is an 62 y.o. male who has known Afib on Eliquis (last took pill at 0800 today).  He was with his friends today at 1530 when he suddenly felt dizzy and then had weakness on the left arm>leg.  He slumped to the left side and his friends helped ease him to the floor.  He did sustain a skin tear on the left arm and left shin. EMS was called and due to noting left arm flaccidity and left facial droop --Code stroke was called. Initial CT head showed a right IC basal ganglia hemorrhage.  BP 158/85.  Order for [place] placed.  Currently he is awake and alert in no distress.  Complaining of left arm and leg weakness along with mild slurred speech.   Date last known well: Date: 04/07/2014 Time last known well: Time: 15:30 tPA Given: No: ICH  Past Medical History  Diagnosis Date  . Hypertension   . Gout   . Aortic stenosis     echo 06/19/08 with nomal LV function, moderate concentric LVH moderate aortic stenosis area 0.99 cm squared, peak gradient of 50 and mean of 31  . Hyperlipidemia   . Atrial fibrillation with RVR, was in atrial fib in 04/2011 09/16/2012    a) s/p MAZE (09/2012)   . Tobacco use 09/16/2012  . Heart murmur   . Chronic combined systolic and diastolic CHF (congestive heart failure) 09/16/2012    a) ECHO (08/2013) EF 50-55%, grade II DD b) TEE ECHO (09/2013): 40-45%, bioprosthesis present, mild MR  . Aortic stenosis, severe 09/23/2012  . CAD (coronary artery disease), single vessel disease 09/23/2012    a) CABG (SVG to PDA and PL) wtih AVR (09/2012)   . PAD (peripheral artery disease), decreased bil. ABIs 09/23/2012    a) ABIs (11/2013): Right mild arterial insufficiency, L normal; aroto-bifem bypass graft: not well visualized, bilateral SFAs = to greater than 50% in dimaeter reduction   . Gout  flare. 09/29/12, Lt knee, improved with colchicine. 09/30/2012  . H/O aortic valve replacement   . AS (aortic stenosis) with AVR with pericardial tissue valve  09/30/2013  . Back pain, spinal stenosis 09/30/2013  . COPD (chronic obstructive pulmonary disease)   . Bilateral carotid artery disease     Past Surgical History  Procedure Laterality Date  . Iliac artery stent  10/20/08    stent to lt iliac  . Aorto bifem bypass  12/18/08    by Dr. Trula Slade  . Coronary artery bypass graft N/A 10/08/2012    Procedure: CORONARY ARTERY BYPASS GRAFTING (CABG);  Surgeon: Grace Isaac, MD;  Location: [place];  Service: Open Heart  Surgery;  Laterality: N/A;  Coronary Artery bypass Graft times two utilizing the left greater saphenous vein harvested endoscopically  . Aortic valve replacement N/A 10/08/2012    Procedure: AORTIC VALVE REPLACEMENT (AVR);  Surgeon: Grace Isaac, MD;  Location: [place];  Service: Open Heart Surgery;  Laterality: N/A;  . Maze N/A 10/08/2012    Procedure: MAZE;  Surgeon: Grace Isaac, MD;  Location: [place];  Service: Open Heart Surgery;  Laterality: N/A;  . Intraoperative transesophageal echocardiogram N/A 10/08/2012    Procedure: INTRAOPERATIVE TRANSESOPHAGEAL ECHOCARDIOGRAM;  Surgeon: Grace Isaac, MD;  Location: [place];  Service: Open Heart Surgery;  Laterality: N/A;  . Endovein harvest of greater saphenous vein Bilateral 10/08/2012    Procedure: ENDOVEIN HARVEST OF GREATER SAPHENOUS VEIN;  Surgeon: Grace Isaac, MD;  Location: [place];  Service: Open Heart Surgery;  Laterality: Bilateral;  . US echocardiography  06/20/2011    mod concentric LVH,LA severely dilated,RA mildly dilated,mod. ca+ of the mitral apparatus,trace MR,mod. ca+ AOV w/stenosis.  Marland Kitchen Nm myocar perf wall motion  08/25/2008    normal  . Cardioversion N/A 02/25/2013    Procedure: CARDIOVERSION;  Surgeon: Sanda Klein, MD;  Location: [place];  Service: Cardiovascular;  Laterality: N/A;  . Tee without  cardioversion N/A 09/25/2013    Procedure: TRANSESOPHAGEAL ECHOCARDIOGRAM (TEE);  Surgeon: Thayer Headings, MD;  Location: [place];  Service: Cardiovascular;  Laterality: N/A;  . Cardioversion N/A 09/25/2013    Procedure: CARDIOVERSION;  Surgeon: Thayer Headings, MD;  Location: [facility] ENDOSCOPY;  Service: Cardiovascular;  Laterality: N/A;  . Left and right heart catheterization with coronary angiogram N/A 09/20/2012    Procedure: LEFT AND RIGHT HEART CATHETERIZATION WITH CORONARY ANGIOGRAM;  Surgeon: Lorretta Harp, MD;  Location: [facility] CATH LAB;  Service: Cardiovascular;  Laterality: N/A;    Family History  Problem Relation Age of Onset  . Heart attack Father     died at 73 with MI  . Heart disease Brother   . Heart attack Brother   . Heart attack Brother    Social History:  reports that he quit smoking about 18 months ago. His smoking use included Cigarettes. He has a 43 pack-year smoking history. He has never used smokeless tobacco. He reports that he drinks about 25.2 - 50.4 oz of alcohol per week. He reports that he does not use illicit drugs.  Allergies: No Known Allergies  Medications:                                                                                                                           Current Facility-Administered Medications  Medication Dose Route Frequency Provider Last Rate Last Dose  . budesonide-formoterol (SYMBICORT) 80-4.5 MCG/ACT inhaler 2 puff  2 puff Inhalation BID Marliss Coots, PA-C      . digoxin [facility]) tablet 0.25 mg  0.25 mg Oral QODAY Marliss Coots, PA-C   0.25 mg at 04/07/14 1649  . prothrombin complex  conc human [facility]) IVPB 4,837 Units  4,837 Units Intravenous STAT MUHAMMADALI RIES, [place]   4,837 Units at 04/07/14 1712   Outpatient medications  Albuterol, Eliquis, Lipitor, Symbicort, Coreg, Digoxin, Folvite, Neurontin, Zaroxolyn, K-dur, Aldactone, Zanaflex, Demadex   ROS:                                                                                                                                        History obtained from the patient  General ROS: negative for - chills, fatigue, fever, night sweats, weight gain or weight loss Psychological ROS: negative for - behavioral disorder, hallucinations, memory difficulties, mood swings or suicidal ideation Ophthalmic ROS: negative for - blurry vision, double vision, eye pain or loss of vision ENT ROS: negative for - epistaxis, nasal discharge, oral lesions, sore throat, tinnitus or vertigo Allergy and Immunology ROS: negative for - hives or itchy/watery eyes Hematological and Lymphatic ROS: negative for - bleeding problems, bruising or swollen lymph nodes Endocrine ROS: negative for - galactorrhea, hair pattern changes, polydipsia/polyuria or temperature intolerance Respiratory ROS: negative for - cough, hemoptysis, shortness of breath or wheezing Cardiovascular ROS: negative for - chest pain, dyspnea on exertion, edema or irregular heartbeat Gastrointestinal ROS: negative for - abdominal pain, diarrhea, hematemesis, nausea/vomiting or stool incontinence Genito-Urinary ROS: negative for - dysuria, hematuria, incontinence or urinary frequency/urgency Musculoskeletal ROS: negative for - joint swelling or muscular weakness Neurological ROS: as noted in HPI Dermatological ROS: negative for rash and skin lesion changes  Neurologic Examination:                                                                                                      Blood pressure 160/79, pulse 114, temperature 99.4 F (37.4 C), temperature source Oral, resp. rate 23, height 5\' 10"  (1.778 m), weight 105.699 kg (233 lb 0.4 oz), SpO2 98 %.  HEENT-  Normocephalic, no lesions, without obvious abnormality.  Normal external eye and conjunctiva.  Normal TM's bilaterally.  Normal auditory canals and external ears. Normal external nose, mucus membranes and septum.  Normal pharynx. Cardiovascular- irregularly irregular  rhythm, pulses palpable throughout   Lungs- chest clear, no wheezing, rales, normal symmetric air entry Abdomen- soft, non-tender; bowel sounds normal; no masses,  no organomegaly Extremities- less then 2 second capillary refill and skin tear on left arm and left shin Lymph-no adenopathy palpable Musculoskeletal-no joint tenderness, deformity or swelling Skin-warm and dry, no hyperpigmentation, vitiligo, or suspicious lesions   Neurological Examination Mental Status: Alert,  oriented, thought content appropriate.  Speech dysarthric without evidence of aphasia.  Able to follow 3 step commands without difficulty. Cranial Nerves: II: Discs flat bilaterally; Visual fields grossly normal, pupils equal, round, reactive to light and accommodation III,IV, VI: ptosis not present, extra-ocular motions intact bilaterally V,VII: smile asymmetric on the left, facial light touch sensation decreased on left V1-V3 VIII: hearing normal bilaterally IX,X: gag reflex present XI: bilateral shoulder shrug XII: midline tongue extension Motor: Right : Upper extremity   5/5    Left:     Upper extremity   0/5  Lower extremity   5/5     Lower extremity   3/5 Tone and bulk:normal tone throughout; no atrophy noted Sensory: Pinprick and light touch decreased on the left arm and leg Deep Tendon Reflexes: 2+ and symmetric throughout UE, KJ bilateral 2+ no AJ Plantars: Right: downgoing   Left: downgoing Cerebellar: normal finger-to-nose on the right not able to evaluate on the left, normal heel-to-shin test with right leg--unable to obtain on the left leg due to weakness.  Gait: unable to assess    Lab Results: Basic Metabolic Panel:  Recent Labs Lab 04/07/14 0952 04/07/14 1607 04/07/14 1619  NA 140 141 141  K 4.5 3.8 3.5*  CL 102 100 100  CO2 27 29  --   GLUCOSE 145* 99 103*  BUN 31* 30* 30*  CREATININE 1.35 1.29 1.50*  CALCIUM 8.9 9.1  --     Liver Function Tests:  Recent Labs Lab 04/07/14 1607   AST 33  ALT 21  ALKPHOS 78  BILITOT 0.4  PROT 7.8  ALBUMIN 3.3*   No results for input(s): LIPASE, AMYLASE in the last 168 hours. No results for input(s): AMMONIA in the last 168 hours.  CBC:  Recent Labs Lab 04/07/14 1607 04/07/14 1619  WBC 6.9  --   NEUTROABS 3.6  --   HGB 10.9* 12.6*  HCT 35.7* 37.0*  MCV 86.9  --   PLT 215  --     Cardiac Enzymes: No results for input(s): CKTOTAL, CKMB, CKMBINDEX, TROPONINI in the last 168 hours.  Lipid Panel: No results for input(s): CHOL, TRIG, HDL, CHOLHDL, VLDL, LDLCALC in the last 168 hours.  CBG: No results for input(s): GLUCAP in the last 168 hours.  Microbiology: Results for orders placed or performed during the hospital encounter of 09/19/13  Culture, blood (routine x 2)     Status: None   Collection Time: 09/21/13 10:11 AM  Result Value Ref Range Status   Specimen Description BLOOD RIGHT ARM  Final   Special Requests BOTTLES DRAWN AEROBIC AND ANAEROBIC 5CC  Final   Culture  Setup Time   Final    09/21/2013 18:11 Performed at [facility]   Culture   Final    LISTERIA MONOCYTOGENES Note: Recommended therapy: ampicillin with or without an aminoglycoside. Cephalosporins are not effective. CRITICAL RESULT CALLED TO, READ BACK BY AND VERIFIED WITH: OLIVIA STEIN 09/24/13 0800 BY SMITHERSJ FAXED TO [place] [facility] CONNIE WEANT [id number] BY  PEAKY Note: Gram Stain Report Called to,Read Back By and Verified With: HEATHER B BY INGRAM A 09/22/13 1205PM Performed at [facility]   Report Status 09/25/2013 FINAL  Final  Culture, blood (routine x 2)     Status: None   Collection Time: 09/21/13 10:16 AM  Result Value Ref Range Status   Specimen Description BLOOD RIGHT ARM  Final   Special Requests BOTTLES DRAWN AEROBIC AND ANAEROBIC 5CC  Final  Culture  Setup Time   Final    09/21/2013 18:11 Performed at [facility]   Culture   Final    LISTERIA MONOCYTOGENES Note: Recommended therapy: ampicillin with or  without an aminoglycoside. Cephalosporins are not effective. CRITICAL RESULT CALLED TO, READ BACK BY AND VERIFIED WITH: OLIVIA STEIN 09/24/13 0800 BY SMITHERSJ FAXED TO [place] [facility]NIE WEANT [id number] BY  PEAKY Note: Gram Stain Report Called to,Read Back By and Verified With: HEATHER B BY INGRAM A 09/22/13 1205PM Performed at [facility]   Report Status 09/25/2013 FINAL  Final  Culture, Urine     Status: None   Collection Time: 09/21/13 10:24 AM  Result Value Ref Range Status   Specimen Description URINE, RANDOM  Final   Special Requests Normal  Final   Culture  Setup Time   Final    09/21/2013 18:41 Performed at [place]   Final    25,000 COLONIES/ML Performed at [facility]   Culture   Final    Multiple bacterial morphotypes present, none predominant. Suggest appropriate recollection if clinically indicated. Performed at [facility]   Report Status 09/22/2013 FINAL  Final  Culture, blood (routine x 2)     Status: None   Collection Time: 09/22/13  1:56 PM  Result Value Ref Range Status   Specimen Description BLOOD LEFT HAND  Final   Special Requests   Final    BOTTLES DRAWN AEROBIC AND ANAEROBIC 10CC BLUE  8CC RED   Culture  Setup Time   Final    09/22/2013 18:23 Performed at [facility]   Culture   Final    LISTERIA MONOCYTOGENES Note: Recommended therapy: ampicillin with or without an aminoglycoside. Cephalosporins are not effective. Note: Gram Stain Report Called to,Read Back By and Verified With: HEATHER B@[id number]  ON [id number] BY [facility] Performed at [facility]   Report Status 09/26/2013 FINAL  Final  Culture, blood (routine x 2)     Status: None   Collection Time: 09/22/13  2:02 PM  Result Value Ref Range Status   Specimen Description BLOOD RIGHT HAND  Final   Special Requests BOTTLES DRAWN AEROBIC ONLY 10CC  Final   Culture  Setup Time   Final    09/22/2013 18:23 Performed at [facility]    Culture   Final    LISTERIA MONOCYTOGENES Note: Recommended therapy: ampicillin with or without an aminoglycoside. Cephalosporins are not effective. Note: Gram Stain Report Called to,Read Back By and Verified With: BREANA DAVIS ON 09/23/2013 AT 7:21P BY Dennard Nip Performed at [facility]   Report Status 09/26/2013 FINAL  Final  Culture, blood (routine x 2)     Status: None   Collection Time: 09/26/13 10:09 AM  Result Value Ref Range Status   Specimen Description BLOOD RIGHT HAND  Final   Special Requests BOTTLES DRAWN AEROBIC ONLY 10CC  Final   Culture  Setup Time   Final    09/27/2013 06:20 Performed at [facility]   Culture   Final    NO GROWTH 5 DAYS Performed at [facility]   Report Status 10/03/2013 FINAL  Final    Coagulation Studies:  Recent Labs  04/07/14 1607  LABPROT 14.9  INR 1.15    Imaging: Ct Head Wo Contrast  04/07/2014   CLINICAL DATA:  Stroke.  EXAM: CT HEAD WITHOUT CONTRAST  TECHNIQUE: Contiguous axial images were obtained from the base of the skull through  the vertex without intravenous contrast.  COMPARISON:  None.  FINDINGS: There is a acute hemorrhage in the posterior RIGHT basal ganglia and posterior limb of the RIGHT internal capsule. Intraparenchymal hematoma measures 15 mm x 23 mm on axial imaging. This measures 25 mm craniocaudal.  The hemorrhage also extends to the posterior lateral aspect of the RIGHT thalamus. 4 mm of RIGHT to LEFT midline shift. There is intraventricular hemorrhage associated with RIGHT through from the parenchymal hemorrhage. There is underlying atrophy and chronic ischemic white matter disease.  Focal low attenuation is present in the LEFT posterior temporal lobe adjacent to the LEFT lateral ventricle atrium. This is favored to represent asymmetric chronic ischemic white matter disease however on old infarct for even subacute ischemia could have this appearance. Little if any effacement of sulci suggests that this  is a chronic finding.  The calvarium is intact. No hydrocephalus. The visible paranasal sinuses are within normal limits. RIGHT septal spur and septal deviation. Mastoid air cells clear. Intracranial atherosclerosis.  IMPRESSION: 1. Acute posterior RIGHT basal ganglia and internal capsule hemorrhage measuring 15 mm x 23 mm. 2. 4 mm of RIGHT to LEFT midline shift. 3. Critical Value/emergent results were called by telephone at the time of interpretation on 04/07/2014 at 4:23 pm to Dr. Doy Mince, who verbally acknowledged these results. 4. Area of low attenuation in the posterior LEFT temporal lobe may represent subacute ischemia but the appearance is more suggestive of either an old infarct or asymmetric chronic ischemic white matter disease.   Electronically Signed   By: Dereck Ligas M.D.   On: 04/07/2014 16:24    Leslee Haueter PA-C [facility] [phone]  04/07/2014, 5:26 PM   Patient seen and examined.  Clinical course and management discussed.  Necessary edits performed.  I agree with the above.  Assessment and plan of care developed and discussed below.   Assessment: 63 y.o. male with acute onset left sided weakness.  Brought in by EMS as a code stroke.  Head CT personally reviewed and shows a right basal ganglial ICH with interventricular extension.  1mm of midline shift noted.  Patient therefore not a tPA candidate.  Patient on Eliquis.  BP controlled.  Stroke Risk Factors - atrial fibrillation, hyperlipidemia, hypertension and CAD  Recommendations: 1.  Reversal of anticoagulant 2.  No further anticoagulants or antiplatelet therapy 3.  Echocardiogram 4. HgbA1c, fasting lipid panel 5. MRI, MRA  of the brain without contrast 6. PT consult, OT consult, Speech consult 7. Prophylactic therapy-None 7. NPO until RN stroke swallow screen 8. Telemetry monitoring 9. Frequent neuro checks 10. Repeat head CT in 24 hours   This patient is critically ill and at significant risk of  neurological worsening, death and care requires constant monitoring of vital signs, hemodynamics,respiratory and cardiac monitoring, neurological assessment, discussion with family, other specialists and medical decision making of high complexity. I spent 60 minutes of neurocritical care time  in the care of  this patient.   Alexis Goodell, MD [facility] [phone]  04/07/2014  5:38 PM

## 2014-04-08 ENCOUNTER — Inpatient Hospital Stay (HOSPITAL_COMMUNITY): Payer: BC Managed Care – PPO

## 2014-04-08 DIAGNOSIS — I618 Other nontraumatic intracerebral hemorrhage: Secondary | ICD-10-CM

## 2014-04-08 DIAGNOSIS — I610 Nontraumatic intracerebral hemorrhage in hemisphere, subcortical: Principal | ICD-10-CM

## 2014-04-08 LAB — GLUCOSE, CAPILLARY
GLUCOSE-CAPILLARY: 136 mg/dL — AB (ref 70–99)
GLUCOSE-CAPILLARY: 120 mg/dL — AB (ref 70–99)
Glucose-Capillary: 192 mg/dL — ABNORMAL HIGH (ref 70–99)

## 2014-04-08 MED ORDER — CETYLPYRIDINIUM CHLORIDE 0.05 % MT LIQD
7.0000 mL | Freq: Two times a day (BID) | OROMUCOSAL | Status: DC
  Administered 2014-04-08 – 2014-04-13 (×12): 7 mL via OROMUCOSAL

## 2014-04-08 MED ORDER — TORSEMIDE 20 MG PO TABS
40.0000 mg | ORAL_TABLET | Freq: Every day | ORAL | Status: DC
  Administered 2014-04-09 – 2014-04-13 (×5): 40 mg via ORAL
  Filled 2014-04-08 (×6): qty 2

## 2014-04-08 MED ORDER — SPIRONOLACTONE 25 MG PO TABS
25.0000 mg | ORAL_TABLET | Freq: Every day | ORAL | Status: DC
  Administered 2014-04-08 – 2014-04-13 (×6): 25 mg via ORAL
  Filled 2014-04-08 (×6): qty 1

## 2014-04-08 MED ORDER — TIZANIDINE HCL 4 MG PO TABS
8.0000 mg | ORAL_TABLET | Freq: Two times a day (BID) | ORAL | Status: DC
  Administered 2014-04-08 – 2014-04-13 (×11): 8 mg via ORAL
  Filled 2014-04-08 (×12): qty 2

## 2014-04-08 MED ORDER — TORSEMIDE 20 MG PO TABS
20.0000 mg | ORAL_TABLET | Freq: Every day | ORAL | Status: DC
  Administered 2014-04-08 – 2014-04-12 (×5): 20 mg via ORAL
  Filled 2014-04-08 (×6): qty 1

## 2014-04-08 MED ORDER — POTASSIUM CHLORIDE CRYS ER 20 MEQ PO TBCR
20.0000 meq | EXTENDED_RELEASE_TABLET | Freq: Every day | ORAL | Status: DC
  Administered 2014-04-08 – 2014-04-13 (×6): 20 meq via ORAL
  Filled 2014-04-08 (×6): qty 1

## 2014-04-08 MED ORDER — TORSEMIDE 20 MG PO TABS: 20.0000 mg | ORAL_TABLET | Freq: Two times a day (BID) | ORAL | Status: DC

## 2014-04-08 MED ORDER — METOLAZONE 2.5 MG PO TABS
2.5000 mg | ORAL_TABLET | ORAL | Status: DC
  Administered 2014-04-08 – 2014-04-13 (×3): 2.5 mg via ORAL
  Filled 2014-04-08 (×3): qty 1

## 2014-04-08 MED ORDER — ATORVASTATIN CALCIUM 80 MG PO TABS
80.0000 mg | ORAL_TABLET | Freq: Every day | ORAL | Status: DC
  Administered 2014-04-08 – 2014-04-12 (×5): 80 mg via ORAL
  Filled 2014-04-08 (×6): qty 1

## 2014-04-08 MED ORDER — CARVEDILOL 12.5 MG PO TABS
12.5000 mg | ORAL_TABLET | Freq: Two times a day (BID) | ORAL | Status: DC
  Administered 2014-04-08 – 2014-04-13 (×10): 12.5 mg via ORAL
  Filled 2014-04-08 (×13): qty 1

## 2014-04-08 NOTE — Progress Notes (Signed)
Stroke Team Progress Note  HISTORY  [name] is an 63 y.o. male who has known Afib on Eliquis (last took pill at 0800 today). He was with his friends today at 1530 when he suddenly felt dizzy and then had weakness on the left arm>leg. He slumped to the left side and his friends helped ease him to the floor. He did sustain a skin tear on the left arm and left shin. EMS was called and due to noting left arm flaccidity and left facial droop --Code stroke was called. Initial CT head showed a right IC basal ganglia hemorrhage. BP 158/85. Order for [place] placed. Currently he is awake and alert in no distress. Complaining of left arm and leg weakness along with mild slurred speech.   Date last known well: Date: 04/07/2014 Time last known well: Time: 15:30 tPA Given: No: ICH Patient was not a TPA candidate secondary to [place]. He was admitted to the neuro ICU for further evaluation and treatment.  SUBJECTIVE No family is at bedside. The patient is bright and alert, fully cooperative. She has no complaints of headache, nausea or vomiting. No chest pain or shortness of breath.  OBJECTIVE Most recent Vital Signs: Filed Vitals:   04/08/14 0400 04/08/14 0500 04/08/14 0600 04/08/14 0700  BP: 122/44 99/87 103/50 124/63  Pulse: 113 103 113 113  Temp:      TempSrc:      Resp: 25 25 23 17   Height:      Weight:      SpO2: 100% 98% 97% 98%   CBG (last 3)   Recent Labs  04/07/14 2224  GLUCAP 106*    IV Fluid Intake:   . sodium chloride 75 mL/hr at 04/07/14 2201    MEDICATIONS  . antiseptic oral rinse  7 mL Mouth Rinse BID  . budesonide-formoterol  2 puff Inhalation BID  . digoxin  0.25 mg Oral QODAY  . pantoprazole (PROTONIX) IV  40 mg Intravenous QHS  . senna-docusate  1 tablet Oral BID   PRN:  acetaminophen **OR** acetaminophen, labetalol  Diet:  Diet Carb Modified thin liquids Activity:  Bedrest DVT Prophylaxis:  SCD  CLINICALLY SIGNIFICANT STUDIES Basic Metabolic  Panel:  Recent Labs Lab 04/07/14 0952 04/07/14 1607 04/07/14 1619  NA 140 141 141  K 4.5 3.8 3.5*  CL 102 100 100  CO2 27 29  --   GLUCOSE 145* 99 103*  BUN 31* 30* 30*  CREATININE 1.35 1.29 1.50*  CALCIUM 8.9 9.1  --    Liver Function Tests:  Recent Labs Lab 04/07/14 1607  AST 33  ALT 21  ALKPHOS 78  BILITOT 0.4  PROT 7.8  ALBUMIN 3.3*   CBC:  Recent Labs Lab 04/07/14 1607 04/07/14 1619  WBC 6.9  --   NEUTROABS 3.6  --   HGB 10.9* 12.6*  HCT 35.7* 37.0*  MCV 86.9  --   PLT 215  --    Coagulation:  Recent Labs Lab 04/07/14 1607  LABPROT 14.9  INR 1.15   Cardiac Enzymes: No results for input(s): CKTOTAL, CKMB, CKMBINDEX, TROPONINI in the last 168 hours. Urinalysis:  Recent Labs Lab 04/07/14 1803  COLORURINE YELLOW  LABSPEC 1.009  PHURINE 5.5  GLUCOSEU NEGATIVE  HGBUR TRACE*  BILIRUBINUR NEGATIVE  KETONESUR NEGATIVE  PROTEINUR 30*  UROBILINOGEN 0.2  NITRITE NEGATIVE  LEUKOCYTESUR NEGATIVE   Lipid Panel    Component Value Date/Time   CHOL 197 12/31/2013 1102   TRIG 90 12/31/2013 1102  HDL 73 12/31/2013 1102   CHOLHDL 2.7 12/31/2013 1102   VLDL 18 12/31/2013 1102   LDLCALC 106* 12/31/2013 1102   HgbA1C  Lab Results  Component Value Date   HGBA1C 5.2 10/07/2012    Urine Drug Screen:     Component Value Date/Time   LABOPIA NONE DETECTED 04/07/2014 1803   COCAINSCRNUR NONE DETECTED 04/07/2014 1803   LABBENZ NONE DETECTED 04/07/2014 1803   AMPHETMU NONE DETECTED 04/07/2014 1803   THCU NONE DETECTED 04/07/2014 1803   LABBARB NONE DETECTED 04/07/2014 1803    Alcohol Level:  Recent Labs Lab 04/07/14 1607  ETH <11    Ct Head Wo Contrast  04/07/2014   CLINICAL DATA:  Stroke.  EXAM: CT HEAD WITHOUT CONTRAST  TECHNIQUE: Contiguous axial images were obtained from the base of the skull through the vertex without intravenous contrast.  COMPARISON:  None.  FINDINGS: There is a acute hemorrhage in the posterior RIGHT basal ganglia and  posterior limb of the RIGHT internal capsule. Intraparenchymal hematoma measures 15 mm x 23 mm on axial imaging. This measures 25 mm craniocaudal.  The hemorrhage also extends to the posterior lateral aspect of the RIGHT thalamus. 4 mm of RIGHT to LEFT midline shift. There is intraventricular hemorrhage associated with RIGHT through from the parenchymal hemorrhage. There is underlying atrophy and chronic ischemic white matter disease.  Focal low attenuation is present in the LEFT posterior temporal lobe adjacent to the LEFT lateral ventricle atrium. This is favored to represent asymmetric chronic ischemic white matter disease however on old infarct for even subacute ischemia could have this appearance. Little if any effacement of sulci suggests that this is a chronic finding.  The calvarium is intact. No hydrocephalus. The visible paranasal sinuses are within normal limits. RIGHT septal spur and septal deviation. Mastoid air cells clear. Intracranial atherosclerosis.  IMPRESSION: 1. Acute posterior RIGHT basal ganglia and internal capsule hemorrhage measuring 15 mm x 23 mm. 2. 4 mm of RIGHT to LEFT midline shift. 3. Critical Value/emergent results were called by telephone at the time of interpretation on 04/07/2014 at 4:23 pm to Dr. Doy Mince, who verbally acknowledged these results. 4. Area of low attenuation in the posterior LEFT temporal lobe may represent subacute ischemia but the appearance is more suggestive of either an old infarct or asymmetric chronic ischemic white matter disease.   Electronically Signed   By: Dereck Ligas M.D.   On: 04/07/2014 16:24    CT of the brain 04/07/14:  IMPRESSION: 1. Acute posterior RIGHT basal ganglia and internal capsule hemorrhage measuring 15 mm x 23 mm. 2. 4 mm of RIGHT to LEFT midline shift. 3. Critical Value/emergent results were called by telephone at the time of interpretation on 04/07/2014 at 4:23 pm to Dr. Doy Mince, who verbally acknowledged these  results. 4. Area of low attenuation in the posterior LEFT temporal lobe may represent subacute ischemia but the appearance is more suggestive of either an old infarct or asymmetric chronic ischemic white matter disease.  MRI of the brain    MRA of the brain    2D Echocardiogram    Carotid Doppler    CXR    EKG  Ectopic atrial tachycardia, unifocal Nonspecific ST and T wave abnormality  Therapy Recommendations PT and OT  Physical Exam  General: The patient is alert and cooperative at the time of the examination. The patient is moderately obese.  Respiratory: Lung fields are clear  Cardiovascular: Occasional irregular heart rhythm, no obvious murmurs or rubs noted.  Skin:  No significant peripheral edema is noted.   Neurologic Exam  Mental status: The patient is oriented x 3.  Cranial nerves: Facial symmetry is present. Speech is normal, no aphasia or dysarthria is noted. Extraocular movements are full. Visual fields are full. tongue protrudes in the midline.  Motor: The patient has good strength the right  extremities. on the left side, the left arm is almost plegic. The left leg can be elevated against gravity, 4 minus/5 strength.  Sensory examination: the patient has profound decreased soft touch and pinprick sensation on the left face, arm, and leg.  Coordination: The patient has good finger-nose-finger and heel-to-shin on the right, the patient is unable to perform with the left arm, has difficulty with the left leg.  Gait and station: The patient is at bedrest, gait was not tested..  Reflexes: Deep tendon reflexes are symmetric, with exception that the left biceps reflex was slightly elevated. .    ASSESSMENT [name] is a 63 y.o. male presenting with a right basal ganglia her intercurrent hemorrhage, patient is on and coagulation with Eliquis. The anticoagulation was reversed, the patient was admitted to the neuroscience intensive care unit for  further evaluation. The patient is alert and cooperative at this time, but he has a significant left hemiparesis particular affecting the left arm.     Right basilar ganglia intracranial hemorrhage  Hypertension  severe aortic stenosis, status post tissue valve replacement  Atrial fibrillation on Eliquis on admission  Dyslipidemia  COPD  Carotid artery disease  Coronary artery disease  Hospital day # 1   A carotid Doppler study was recently done on 02/25/2014, reveals 70% stenosis of the left internal carotid artery, 50-69% stenosis on the right internal carotid artery.  The patient has been stable overnight, has a left hemiparesis and left hemisensory deficit. The patient is taking oral food and fluids.  TREATMENT/PLAN  CT scan of the head today   Physical and occupational therapy evaluation  discontinue antibiotic therapy, the patient will go on aspirin at some point  Will begin to mobilize patient  SCD  Follow neurologic exam.  Blood work in a.m.   04/08/2014 8:03 AM   Lenor Coffin [phone]

## 2014-04-08 NOTE — Evaluation (Addendum)
Clinical/Bedside Swallow Evaluation Patient Details  Name: [name] MRN: [id number] Date of Birth: 1950-10-21  Today's Date: 04/08/2014 Time: 8:26-8:40    Past Medical History:  Past Medical History  Diagnosis Date  . Hypertension   . Gout   . Aortic stenosis     echo 06/19/08 with nomal LV function, moderate concentric LVH moderate aortic stenosis area 0.99 cm squared, peak gradient of 50 and mean of 31  . Hyperlipidemia   . Atrial fibrillation with RVR, was in atrial fib in 04/2011 09/16/2012    a) s/p MAZE (09/2012)   . Tobacco use 09/16/2012  . Heart murmur   . Chronic combined systolic and diastolic CHF (congestive heart failure) 09/16/2012    a) ECHO (08/2013) EF 50-55%, grade II DD b) TEE ECHO (09/2013): 40-45%, bioprosthesis present, mild MR  . Aortic stenosis, severe 09/23/2012  . CAD (coronary artery disease), single vessel disease 09/23/2012    a) CABG (SVG to PDA and PL) wtih AVR (09/2012)   . PAD (peripheral artery disease), decreased bil. ABIs 09/23/2012    a) ABIs (11/2013): Right mild arterial insufficiency, L normal; aroto-bifem bypass graft: not well visualized, bilateral SFAs = to greater than 50% in dimaeter reduction   . Gout flare. 09/29/12, Lt knee, improved with colchicine. 09/30/2012  . H/O aortic valve replacement   . AS (aortic stenosis) with AVR with pericardial tissue valve  09/30/2013  . Back pain, spinal stenosis 09/30/2013  . COPD (chronic obstructive pulmonary disease)   . Bilateral carotid artery disease    Past Surgical History:  Past Surgical History  Procedure Laterality Date  . Iliac artery stent  10/20/08    stent to lt iliac  . Aorto bifem bypass  12/18/08    by Dr. Trula Slade  . Coronary artery bypass graft N/A 10/08/2012    Procedure: CORONARY ARTERY BYPASS GRAFTING (CABG);  Surgeon: Grace Isaac, MD;  Location: [place];  Service: Open Heart Surgery;  Laterality: N/A;  Coronary Artery bypass Graft times two utilizing the left greater saphenous vein  harvested endoscopically  . Aortic valve replacement N/A 10/08/2012    Procedure: AORTIC VALVE REPLACEMENT (AVR);  Surgeon: Grace Isaac, MD;  Location: [place];  Service: Open Heart Surgery;  Laterality: N/A;  . Maze N/A 10/08/2012    Procedure: MAZE;  Surgeon: Grace Isaac, MD;  Location: [place];  Service: Open Heart Surgery;  Laterality: N/A;  . Intraoperative transesophageal echocardiogram N/A 10/08/2012    Procedure: INTRAOPERATIVE TRANSESOPHAGEAL ECHOCARDIOGRAM;  Surgeon: Grace Isaac, MD;  Location: [place];  Service: Open Heart Surgery;  Laterality: N/A;  . Endovein harvest of greater saphenous vein Bilateral 10/08/2012    Procedure: ENDOVEIN HARVEST OF GREATER SAPHENOUS VEIN;  Surgeon: Grace Isaac, MD;  Location: [place];  Service: Open Heart Surgery;  Laterality: Bilateral;  . US echocardiography  06/20/2011    mod concentric LVH,LA severely dilated,RA mildly dilated,mod. ca+ of the mitral apparatus,trace MR,mod. ca+ AOV w/stenosis.  Marland Kitchen Nm myocar perf wall motion  08/25/2008    normal  . Cardioversion N/A 02/25/2013    Procedure: CARDIOVERSION;  Surgeon: Sanda Klein, MD;  Location: [place];  Service: Cardiovascular;  Laterality: N/A;  . Tee without cardioversion N/A 09/25/2013    Procedure: TRANSESOPHAGEAL ECHOCARDIOGRAM (TEE);  Surgeon: Thayer Headings, MD;  Location: [place];  Service: Cardiovascular;  Laterality: N/A;  . Cardioversion N/A 09/25/2013    Procedure: CARDIOVERSION;  Surgeon: Thayer Headings, MD;  Location: [place];  Service: Cardiovascular;  Laterality: N/A;  . Left and right heart catheterization with coronary angiogram N/A 09/20/2012    Procedure: LEFT AND RIGHT HEART CATHETERIZATION WITH CORONARY ANGIOGRAM;  Surgeon: Lorretta Harp, MD;  Location: [facility] CATH LAB;  Service: Cardiovascular;  Laterality: N/A;   HPI:  63 y.o. male who has known Afib, COPD, aortic valve replacement, HTN, PAD admitted after sudden  dizziness and weakness on the left  arm>leg. CT head showed a right IC basal ganglia hemorrhage. No CXR performed.     Assessment / Plan / Recommendation Clinical Impression  Pt. exhibited pharyngeal dysphagia evidenced by coughing episode following bite of soft solid (scrambled eggs) with immediate sip water. Duration of cough was approximately 4 minutes. Pt verbalized globus sensation in pharynx with multiple swallows indicative of possible residue.  Verbal cues provided for small bites, alternating liquids/solids and 2 swallows after bites appeared to prevent further s/s aspiration.  Continue regular texture, thin liquids, no straws, pills whole in applesauce, swallow twice, alternate liquids and solids.  ST will continue to intervene and may need objective assessment.    Aspiration Risk  Moderate    Diet Recommendation Regular;Thin liquid   Liquid Administration via: Cup;No straw Medication Administration: Whole meds with puree Supervision: Patient able to self feed;Full supervision/cueing for compensatory strategies Compensations: Slow rate;Small sips/bites;Multiple dry swallows after each bite/sip;Follow solids with liquid Postural Changes and/or Swallow Maneuvers: Seated upright 90 degrees;Upright 30-60 min after meal    Other  Recommendations Oral Care Recommendations: Oral care BID   Follow Up Recommendations   (TBD)    Frequency and Duration min 2x/week  2 weeks   Pertinent Vitals/Pain No pain         Swallow Study         Oral/Motor/Sensory Function Overall Oral Motor/Sensory Function: Impaired Labial ROM: Reduced left Labial Symmetry: Abnormal symmetry left Labial Strength: Other (Comment) Labial Sensation: Reduced Lingual ROM: Within Functional Limits Lingual Symmetry: Within Functional Limits Lingual Strength: Within Functional Limits Facial ROM: Within Functional Limits Facial Symmetry: Within Functional Limits Facial Sensation: Reduced Mandible: Within Functional Limits   Ice Chips Ice  chips: Not tested   Thin Liquid Thin Liquid: Impaired Pharyngeal  Phase Impairments: Throat Clearing - Delayed    Nectar Thick Nectar Thick Liquid: Not tested   Honey Thick Honey Thick Liquid: Not tested   Puree Puree: Not tested   Solid   GO    Solid: Impaired Pharyngeal Phase Impairments: Cough - Immediate;Cough - Delayed (may  have been secondary )       [name] 04/08/2014,2:27 PM   Orbie Pyo Colvin Caroli.[employer] [phone]

## 2014-04-08 NOTE — Progress Notes (Signed)
PT Cancellation Note  Patient Details Name: KENYETTA FIFE MRN: [id number] DOB: 11-12-1950   Cancelled Treatment:    Reason Eval/Treat Not Completed: Patient not medically ready.  Pt currently on bedrest.  Please advance activity when appropriate for mobility and therapy.     Jenney Brester, Thornton Papas 04/08/2014, 8:21 AM

## 2014-04-08 NOTE — Evaluation (Signed)
Physical Therapy Evaluation Patient Details Name: [name] MRN: [id number] DOB: 1950/07/20 Today's Date: 04/08/2014   History of Present Illness  63 yo male admitted due to sudden dizziness and weakness on L UE/ LE. Pt s/p fall with (A) from friends to floor with skin tear. pt with slurred speech. CT (+) R basal ganglia with internal capsula . Further workup pending. PMH: AFib,COPD, HTN, CAD, aortic stenosis s/p valve replacement, gout, PAD,  Clinical Impression  Pt presents with poor proprioception, awareness of midline, L sided weakness, and diminished sensation.  Pt is a heavy pusher towards L side during mobility requiring extensive 2 person A for mobility.  At this time feel pt would benefit from CIR at D/C to decrease burden of care prior to returning to home.  Will continue to follow.      Follow Up Recommendations CIR    Equipment Recommendations   (TBD)    Recommendations for Other Services Rehab consult     Precautions / Restrictions Precautions Precautions: Fall Restrictions Weight Bearing Restrictions: No      Mobility  Bed Mobility Overal bed mobility: Needs Assistance;+2 for physical assistance Bed Mobility: Supine to Sit     Supine to sit: Mod assist;HOB elevated     General bed mobility comments: pt attempts to A with mobility and is able to use momentum to A with bringing trunk up to sitting.    Transfers Overall transfer level: Needs assistance Equipment used: 2 person hand held assist Transfers: Sit to/from [facility] Sit to Stand: Max assist;+2 physical assistance Stand pivot transfers: Total assist;+2 physical assistance       General transfer comment: pt with very strong L sided lean when coming to stand.  pt with poor proprioception and awareness of midline.  Max facilitation and cueing to A with bringing pt over center of balance and step-by-step through pivot towards R side to recliner.    Ambulation/Gait                Stairs            Wheelchair Mobility    Modified Rankin (Stroke Patients Only) Modified Rankin (Stroke Patients Only) Pre-Morbid Rankin Score: Slight disability Modified Rankin: Severe disability     Balance Overall balance assessment: Needs assistance Sitting-balance support: Single extremity supported;Feet supported Sitting balance-Leahy Scale: Zero Sitting balance - Comments: pt with poor awareness of midline and proprioception.  pt pushes to L side Postural control: Left lateral lean                                   Pertinent Vitals/Pain Pain Assessment: No/denies pain    Home Living Family/patient expects to be discharged to:: Inpatient rehab                      Prior Function Level of Independence: Independent with assistive device(s)         Comments: pt used a RW 2/2 back pain     Hand Dominance   Dominant Hand: Right    Extremity/Trunk Assessment   Upper Extremity Assessment: Defer to OT evaluation           Lower Extremity Assessment: LLE deficits/detail   LLE Deficits / Details: pt able to actively move LE with strength 2/5.  pt with diminished soft touch and proprioception.    Cervical / Trunk Assessment: Kyphotic  Communication  Communication: No difficulties  Cognition Arousal/Alertness: Awake/alert Behavior During Therapy: WFL for tasks assessed/performed Overall Cognitive Status: Within Functional Limits for tasks assessed                      General Comments      Exercises        Assessment/Plan    PT Assessment Patient needs continued PT services  PT Diagnosis Difficulty walking;Hemiplegia non-dominant side   PT Problem List Decreased strength;Decreased activity tolerance;Decreased balance;Decreased mobility;Decreased coordination;Decreased knowledge of use of DME;Impaired sensation  PT Treatment Interventions DME instruction;Gait training;Functional mobility  training;Therapeutic activities;Therapeutic exercise;Balance training;Neuromuscular re-education;Patient/family education   PT Goals (Current goals can be found in the Care Plan section) Acute Rehab PT Goals Patient Stated Goal: Back home PT Goal Formulation: With patient Time For Goal Achievement: 04/22/14 Potential to Achieve Goals: Good    Frequency Min 4X/week   Barriers to discharge        Co-evaluation               End of Session Equipment Utilized During Treatment: Gait belt Activity Tolerance: Patient tolerated treatment well Patient left: in chair;with call bell/phone within reach Nurse Communication: Mobility status;Need for lift equipment         Time: 1132-1210 PT Time Calculation (min) (ACUTE ONLY): 38 min   Charges:   PT Evaluation $Initial PT Evaluation Tier I: 1 Procedure PT Treatments $Therapeutic Activity: 23-37 mins   PT G CodesCatarina Hartshorn, [place] [phone] 04/08/2014, 2:33 PM

## 2014-04-08 NOTE — Progress Notes (Signed)
Rehab Admissions Coordinator Note:  Patient was screened by Retta Diones for appropriateness for an Inpatient Acute Rehab Consult.  At this time, we are recommending Inpatient Rehab consult.  Retta Diones 04/08/2014, 2:38 PM  I can be reached at [phone].

## 2014-04-08 NOTE — Progress Notes (Signed)
UR completed.  Paralee Pendergrass, RN BSN MHA CCM Trauma/Neuro ICU Case Manager [phone]  

## 2014-04-08 NOTE — Consult Note (Signed)
Physical Medicine and Rehabilitation Consult Reason for Consult: Right basal ganglia intracranial hemorrhage Referring Physician: Dr. Leonie Man   HPI: [name] is a 63 y.o. right handed male history of hypertension, atrial fibrillation maintained on Eliquis status post MAZE June 2014. Independent prior to admission. Presented 04/07/2014 with left-sided weakness and mild slurred speech .            . Cranial CT scan showed a right IC basal ganglia hemorrhage with a 4 mm right to left midline shift. Blood pressure 158/85.Eliquis discontinued in light of intracranial hemorrhage. The patient did not receive TPA. Close monitoring of blood pressure. Recent carotid Dopplers 02/25/2014 revealed 70% left ICA and 50-69% right ICA stenosis. Follow-up cranial CT scan 04/08/2014 pending. Patient is tolerating a regular consistency diet. Physical therapy evaluation completed 04/08/2014 with recommendations of physical medicine rehabilitation consultation.                                                                                                                                                Review of Systems  Cardiovascular: Positive for palpitations.  Gastrointestinal: Positive for constipation.  Musculoskeletal: Positive for myalgias and back pain.  All other systems reviewed and are negative.  Past Medical History  Diagnosis Date  . Hypertension   . Gout   . Aortic stenosis     echo 06/19/08 with nomal LV function, moderate concentric LVH moderate aortic stenosis area 0.99 cm squared, peak gradient of 50 and mean of 31  . Hyperlipidemia   . Atrial fibrillation with RVR, was in atrial fib in 04/2011 09/16/2012    a) s/p MAZE (09/2012)   . Tobacco use 09/16/2012  . Heart murmur   . Chronic combined systolic and diastolic CHF (congestive heart failure) 09/16/2012    a) ECHO (08/2013) EF 50-55%, grade II DD b) TEE ECHO (09/2013): 40-45%, bioprosthesis present, mild MR  . Aortic stenosis,  severe 09/23/2012  . CAD (coronary artery disease), single vessel disease 09/23/2012    a) CABG (SVG to PDA and PL) wtih AVR (09/2012)   . PAD (peripheral artery disease), decreased bil. ABIs 09/23/2012    a) ABIs (11/2013): Right mild arterial insufficiency, L normal; aroto-bifem bypass graft: not well visualized, bilateral SFAs = to greater than 50% in dimaeter reduction   . Gout flare. 09/29/12, Lt knee, improved with colchicine. 09/30/2012  . H/O aortic valve replacement   . AS (aortic stenosis) with AVR with pericardial tissue valve  09/30/2013  . Back pain, spinal stenosis 09/30/2013  . COPD (chronic obstructive pulmonary disease)   . Bilateral carotid artery disease    Past Surgical History  Procedure Laterality Date  . Iliac artery stent  10/20/08    stent to lt iliac  . Aorto bifem bypass  12/18/08    by Dr. Trula Slade  . Coronary artery bypass  graft N/A 10/08/2012    Procedure: CORONARY ARTERY BYPASS GRAFTING (CABG);  Surgeon: Grace Isaac, MD;  Location: [place];  Service: Open Heart Surgery;  Laterality: N/A;  Coronary Artery bypass Graft times two utilizing the left greater saphenous vein harvested endoscopically  . Aortic valve replacement N/A 10/08/2012    Procedure: AORTIC VALVE REPLACEMENT (AVR);  Surgeon: Grace Isaac, MD;  Location: [place];  Service: Open Heart Surgery;  Laterality: N/A;  . Maze N/A 10/08/2012    Procedure: MAZE;  Surgeon: Grace Isaac, MD;  Location: [place];  Service: Open Heart Surgery;  Laterality: N/A;  . Intraoperative transesophageal echocardiogram N/A 10/08/2012    Procedure: INTRAOPERATIVE TRANSESOPHAGEAL ECHOCARDIOGRAM;  Surgeon: Grace Isaac, MD;  Location: [place];  Service: Open Heart Surgery;  Laterality: N/A;  . Endovein harvest of greater saphenous vein Bilateral 10/08/2012    Procedure: ENDOVEIN HARVEST OF GREATER SAPHENOUS VEIN;  Surgeon: Grace Isaac, MD;  Location: [place];  Service: Open Heart Surgery;  Laterality: Bilateral;  . US  echocardiography  06/20/2011    mod concentric LVH,LA severely dilated,RA mildly dilated,mod. ca+ of the mitral apparatus,trace MR,mod. ca+ AOV w/stenosis.  Marland Kitchen Nm myocar perf wall motion  08/25/2008    normal  . Cardioversion N/A 02/25/2013    Procedure: CARDIOVERSION;  Surgeon: Sanda Klein, MD;  Location: [place];  Service: Cardiovascular;  Laterality: N/A;  . Tee without cardioversion N/A 09/25/2013    Procedure: TRANSESOPHAGEAL ECHOCARDIOGRAM (TEE);  Surgeon: Thayer Headings, MD;  Location: [place];  Service: Cardiovascular;  Laterality: N/A;  . Cardioversion N/A 09/25/2013    Procedure: CARDIOVERSION;  Surgeon: Thayer Headings, MD;  Location: [facility] ENDOSCOPY;  Service: Cardiovascular;  Laterality: N/A;  . Left and right heart catheterization with coronary angiogram N/A 09/20/2012    Procedure: LEFT AND RIGHT HEART CATHETERIZATION WITH CORONARY ANGIOGRAM;  Surgeon: Lorretta Harp, MD;  Location: [facility] CATH LAB;  Service: Cardiovascular;  Laterality: N/A;   Family History  Problem Relation Age of Onset  . Heart attack Father     died at 11 with MI  . Heart disease Brother   . Heart attack Brother   . Heart attack Brother    Social History:  reports that he quit smoking about 18 months ago. His smoking use included Cigarettes. He has a 43 pack-year smoking history. He has never used smokeless tobacco. He reports that he drinks about 25.2 - 50.4 oz of alcohol per week. He reports that he does not use illicit drugs. Allergies: No Known Allergies Medications Prior to Admission  Medication Sig Dispense Refill  . albuterol (PROVENTIL HFA;VENTOLIN HFA) 108 (90 BASE) MCG/ACT inhaler Inhale 2 puffs into the lungs every 6 (six) hours as needed for wheezing or shortness of breath. 1 Inhaler 2  . apixaban (ELIQUIS) 5 MG TABS tablet Take 1 tablet (5 mg total) by mouth 2 (two) times daily. 180 tablet 3  . atorvastatin (LIPITOR) 80 MG tablet Take 1 tablet (80 mg total) by mouth daily at 6 PM. 30 tablet  6  . budesonide-formoterol (SYMBICORT) 80-4.5 MCG/ACT inhaler Inhale 2 puffs into the lungs 2 (two) times daily. 1 Inhaler 12  . carvedilol (COREG) 12.5 MG tablet Take 2 tablets (25 mg total) by mouth 2 (two) times daily with a meal. (Patient taking differently: Take 12.5 mg by mouth 2 (two) times daily. ) 30 tablet 3  . digoxin (LANOXIN) 0.25 MG tablet Take 1 tablet (0.25 mg total) by mouth every other  day. 30 tablet 6  . folic acid (FOLVITE) 1 MG tablet Take 1 tablet (1 mg total) by mouth daily. 30 tablet 6  . gabapentin (NEURONTIN) 300 MG capsule Take 300 mg by mouth 3 (three) times daily.    . metolazone (ZAROXOLYN) 2.5 MG tablet Take 2.5 mg by mouth every Monday, Wednesday, and Friday.     . OXYGEN-HELIUM IN Inhale 1-2 L into the lungs continuous. Is in the habit of not using his oxygen when he leaves the house. Uses 1L continuous normally Uses 2L for exertion    . potassium chloride SA (K-DUR,KLOR-CON) 20 MEQ tablet Take 1 tablet (20 mEq total) by mouth daily. 60 tablet 6  . spironolactone (ALDACTONE) 25 MG tablet Take 1 tablet (25 mg total) by mouth daily. 30 tablet 3  . tiZANidine (ZANAFLEX) 4 MG tablet Take 8 mg by mouth 2 (two) times daily.    Marland Kitchen torsemide (DEMADEX) 20 MG tablet Take 40 mg (2 tabs) in the morning and 20 mg (1 tab) in the evening (Patient taking differently: Take 20-40 mg by mouth 2 (two) times daily. Take 40 mg (2 tabs) in the morning and 20 mg (1 tab) in the evening) 90 tablet 3    Home: Home Living Family/patient expects to be discharged to:: Inpatient rehab Living Arrangements: Alone Available Help at Discharge: Family, Available PRN/intermittently Type of Home: Apartment  Lives With: Alone  Functional History: Prior Function Level of Independence: Independent with assistive device(s) Comments: pt used a RW 2/2 back pain Functional Status:  Mobility: Bed Mobility Overal bed mobility: Needs Assistance, +2 for physical assistance Bed Mobility: Supine to  Sit Supine to sit: Mod assist, HOB elevated General bed mobility comments: pt attempts to A with mobility and is able to use momentum to A with bringing trunk up to sitting.   Transfers Overall transfer level: Needs assistance Equipment used: 2 person hand held assist Transfers: Sit to/from Stand, Stand Pivot Transfers Sit to Stand: Max assist, +2 physical assistance Stand pivot transfers: Total assist, +2 physical assistance General transfer comment: pt with very strong L sided lean when coming to stand.  pt with poor proprioception and awareness of midline.  Max facilitation and cueing to A with bringing pt over center of balance and step-by-step through pivot towards R side to recliner.        ADL:    Cognition: Cognition Overall Cognitive Status: Impaired/Different from baseline Arousal/Alertness: Awake/alert Orientation Level: Oriented X4 Attention: Sustained Sustained Attention: Appears intact Memory: Impaired Memory Impairment: Retrieval deficit, Decreased recall of new information Awareness: Appears intact Problem Solving: Appears intact Safety/Judgment: Appears intact (will further assess) Cognition Arousal/Alertness: Awake/alert Behavior During Therapy: WFL for tasks assessed/performed Overall Cognitive Status: Impaired/Different from baseline  Blood pressure 123/74, pulse 102, temperature 98.8 F (37.1 C), temperature source Oral, resp. rate 19, height 5\' 10"  (1.778 m), weight 103.4 kg (227 lb 15.3 oz), SpO2 96 %. Physical Exam  Constitutional: He is oriented to person, place, and time.  HENT:  Head: Normocephalic.  Eyes: EOM are normal.  Neck: Normal range of motion. Neck supple. No thyromegaly present.  Cardiovascular:  Cardiac rate controlled  Respiratory: Effort normal and breath sounds normal. No respiratory distress.  GI: Soft. Bowel sounds are normal. He exhibits no distension. There is no tenderness.  Neurological: He is alert and oriented to person,  place, and time.  Makes good eye contact with examiner. He does exhibit some subtle word processing difficulty.Follows commands. LUE and LLE with tr/2 sensation.  LUE: 1-2/5. LLE: tr to 1/5 hip,knee, ankle. Left facial droop and tongue deviation  Skin: Skin is warm and dry.    Results for orders placed or performed during the hospital encounter of 04/07/14 (from the past 24 hour(s))  Ethanol     Status: None   Collection Time: 04/07/14  4:07 PM  Result Value Ref Range   Alcohol, Ethyl (B) <11 0 - 11 mg/dL  Protime-INR     Status: None   Collection Time: 04/07/14  4:07 PM  Result Value Ref Range   Prothrombin Time 14.9 11.6 - 15.2 seconds   INR 1.15 0.00 - 1.49  APTT     Status: None   Collection Time: 04/07/14  4:07 PM  Result Value Ref Range   aPTT 34 24 - 37 seconds  CBC     Status: Abnormal   Collection Time: 04/07/14  4:07 PM  Result Value Ref Range   WBC 6.9 4.0 - 10.5 K/uL   RBC 4.11 (L) 4.22 - 5.81 MIL/uL   Hemoglobin 10.9 (L) 13.0 - 17.0 g/dL   HCT 35.7 (L) 39.0 - 52.0 %   MCV 86.9 78.0 - 100.0 fL   MCH 26.5 26.0 - 34.0 pg   MCHC 30.5 30.0 - 36.0 g/dL   RDW 16.7 (H) 11.5 - 15.5 %   Platelets 215 150 - 400 K/uL  Differential     Status: None   Collection Time: 04/07/14  4:07 PM  Result Value Ref Range   Neutrophils Relative % 51 43 - 77 %   Neutro Abs 3.6 1.7 - 7.7 K/uL   Lymphocytes Relative 35 12 - 46 %   Lymphs Abs 2.4 0.7 - 4.0 K/uL   Monocytes Relative 11 3 - 12 %   Monocytes Absolute 0.7 0.1 - 1.0 K/uL   Eosinophils Relative 2 0 - 5 %   Eosinophils Absolute 0.1 0.0 - 0.7 K/uL   Basophils Relative 1 0 - 1 %   Basophils Absolute 0.0 0.0 - 0.1 K/uL  Comprehensive metabolic panel     Status: Abnormal   Collection Time: 04/07/14  4:07 PM  Result Value Ref Range   Sodium 141 137 - 147 mEq/L   Potassium 3.8 3.7 - 5.3 mEq/L   Chloride 100 96 - 112 mEq/L   CO2 29 19 - 32 mEq/L   Glucose, Bld 99 70 - 99 mg/dL   BUN 30 (H) 6 - 23 mg/dL   Creatinine, Ser 1.29 0.50  - 1.35 mg/dL   Calcium 9.1 8.4 - 10.5 mg/dL   Total Protein 7.8 6.0 - 8.3 g/dL   Albumin 3.3 (L) 3.5 - 5.2 g/dL   AST 33 0 - 37 U/L   ALT 21 0 - 53 U/L   Alkaline Phosphatase 78 39 - 117 U/L   Total Bilirubin 0.4 0.3 - 1.2 mg/dL   GFR calc non Af Amer 57 (L) >90 mL/min   GFR calc Af Amer 66 (L) >90 mL/min   Anion gap 12 5 - 15  Heparin level - if patient on rivaroxaban (XARELTO) or apixaban Arne [place])     Status: Abnormal   Collection Time: 04/07/14  4:07 PM  Result Value Ref Range   Heparin Unfractionated >2.20 (H) 0.30 - 0.70 IU/mL  I-Stat Troponin, ED (not at [facility])     Status: None   Collection Time: 04/07/14  4:17 PM  Result Value Ref Range   Troponin i, poc 0.01 0.00 - 0.08 ng/mL   Comment 3  I-Stat Chem 8, ED     Status: Abnormal   Collection Time: 04/07/14  4:19 PM  Result Value Ref Range   Sodium 141 137 - 147 mEq/L   Potassium 3.5 (L) 3.7 - 5.3 mEq/L   Chloride 100 96 - 112 mEq/L   BUN 30 (H) 6 - 23 mg/dL   Creatinine, Ser 1.50 (H) 0.50 - 1.35 mg/dL   Glucose, Bld 103 (H) 70 - 99 mg/dL   Calcium, Ion 1.11 (L) 1.13 - 1.30 mmol/L   TCO2 27 0 - 100 mmol/L   Hemoglobin 12.6 (L) 13.0 - 17.0 g/dL   HCT 37.0 (L) 39.0 - 52.0 %  Digoxin level     Status: Abnormal   Collection Time: 04/07/14  4:25 PM  Result Value Ref Range   Digoxin Level 0.7 (L) 0.8 - 2.0 ng/mL  Urine Drug Screen     Status: None   Collection Time: 04/07/14  6:03 PM  Result Value Ref Range   Opiates NONE DETECTED NONE DETECTED   Cocaine NONE DETECTED NONE DETECTED   Benzodiazepines NONE DETECTED NONE DETECTED   Amphetamines NONE DETECTED NONE DETECTED   Tetrahydrocannabinol NONE DETECTED NONE DETECTED   Barbiturates NONE DETECTED NONE DETECTED  Urinalysis, Routine w reflex microscopic     Status: Abnormal   Collection Time: 04/07/14  6:03 PM  Result Value Ref Range   Color, Urine YELLOW YELLOW   APPearance CLEAR CLEAR   Specific Gravity, Urine 1.009 1.005 - 1.030   pH 5.5 5.0 - 8.0    Glucose, UA NEGATIVE NEGATIVE mg/dL   Hgb urine dipstick TRACE (A) NEGATIVE   Bilirubin Urine NEGATIVE NEGATIVE   Ketones, ur NEGATIVE NEGATIVE mg/dL   Protein, ur 30 (A) NEGATIVE mg/dL   Urobilinogen, UA 0.2 0.0 - 1.0 mg/dL   Nitrite NEGATIVE NEGATIVE   Leukocytes, UA NEGATIVE NEGATIVE  Urine microscopic-add on     Status: None   Collection Time: 04/07/14  6:03 PM  Result Value Ref Range   Squamous Epithelial / LPF RARE RARE   WBC, UA 0-2 <3 WBC/hpf   RBC / HPF 0-2 <3 RBC/hpf   Bacteria, UA RARE RARE  MRSA PCR Screening     Status: None   Collection Time: 04/07/14  9:40 PM  Result Value Ref Range   MRSA by PCR NEGATIVE NEGATIVE  Glucose, capillary     Status: Abnormal   Collection Time: 04/07/14 10:24 PM  Result Value Ref Range   Glucose-Capillary 106 (H) 70 - 99 mg/dL  Glucose, capillary     Status: Abnormal   Collection Time: 04/08/14  8:11 AM  Result Value Ref Range   Glucose-Capillary 136 (H) 70 - 99 mg/dL   Comment 1 Notify RN    Comment 2 Documented in Chart   Glucose, capillary     Status: Abnormal   Collection Time: 04/08/14 11:52 AM  Result Value Ref Range   Glucose-Capillary 192 (H) 70 - 99 mg/dL   Comment 1 Notify RN    Comment 2 Documented in Chart    Ct Head Wo Contrast  04/07/2014   CLINICAL DATA:  Stroke.  EXAM: CT HEAD WITHOUT CONTRAST  TECHNIQUE: Contiguous axial images were obtained from the base of the skull through the vertex without intravenous contrast.  COMPARISON:  None.  FINDINGS: There is a acute hemorrhage in the posterior RIGHT basal ganglia and posterior limb of the RIGHT internal capsule. Intraparenchymal hematoma measures 15 mm x 23 mm on axial imaging. This measures 25  mm craniocaudal.  The hemorrhage also extends to the posterior lateral aspect of the RIGHT thalamus. 4 mm of RIGHT to LEFT midline shift. There is intraventricular hemorrhage associated with RIGHT through from the parenchymal hemorrhage. There is underlying atrophy and chronic  ischemic white matter disease.  Focal low attenuation is present in the LEFT posterior temporal lobe adjacent to the LEFT lateral ventricle atrium. This is favored to represent asymmetric chronic ischemic white matter disease however on old infarct for even subacute ischemia could have this appearance. Little if any effacement of sulci suggests that this is a chronic finding.  The calvarium is intact. No hydrocephalus. The visible paranasal sinuses are within normal limits. RIGHT septal spur and septal deviation. Mastoid air cells clear. Intracranial atherosclerosis.  IMPRESSION: 1. Acute posterior RIGHT basal ganglia and internal capsule hemorrhage measuring 15 mm x 23 mm. 2. 4 mm of RIGHT to LEFT midline shift. 3. Critical Value/emergent results were called by telephone at the time of interpretation on 04/07/2014 at 4:23 pm to Dr. Doy Mince, who verbally acknowledged these results. 4. Area of low attenuation in the posterior LEFT temporal lobe may represent subacute ischemia but the appearance is more suggestive of either an old infarct or asymmetric chronic ischemic white matter disease.   Electronically Signed   By: Dereck Ligas M.D.   On: 04/07/2014 16:24    Assessment/Plan: Diagnosis: right BG/IC hemorrhage with left hemiparesis and hemisensory loss 1. Does the need for close, 24 hr/day medical supervision in concert with the patient's rehab needs make it unreasonable for this patient to be served in a less intensive setting? Yes 2. Co-Morbidities requiring supervision/potential complications: paf, htn, pad 3. Due to bladder management, bowel management, safety, skin/wound care, disease management, medication administration, pain management and patient education, does the patient require 24 hr/day rehab nursing? Yes 4. Does the patient require coordinated care of a physician, rehab nurse, PT (1-2 hrs/day, 5 days/week), OT (1-2 hrs/day, 5 days/week) and SLP (1-2 hrs/day, 5 days/week) to address  physical and functional deficits in the context of the above medical diagnosis(es)? Yes Addressing deficits in the following areas: balance, endurance, locomotion, strength, transferring, bowel/bladder control, bathing, dressing, feeding, grooming, toileting, speech, swallowing and psychosocial support 5. Can the patient actively participate in an intensive therapy program of at least 3 hrs of therapy per day at least 5 days per week? Yes 6. The potential for patient to make measurable gains while on inpatient rehab is excellent 7. Anticipated functional outcomes upon discharge from inpatient rehab are supervision and min assist  with PT, supervision and min assist with OT, modified independent and supervision with SLP. 8. Estimated rehab length of stay to reach the above functional goals is: 18-24 days 9. Does the patient have adequate social supports and living environment to accommodate these discharge functional goals? Yes 10. Anticipated D/C setting: Home 11. Anticipated post D/C treatments: HH therapy and Outpatient therapy 12. Overall Rehab/Functional Prognosis: excellent  RECOMMENDATIONS: This patient's condition is appropriate for continued rehabilitative care in the following setting: CIR Patient has agreed to participate in recommended program. Yes Note that insurance prior authorization may be required for reimbursement for recommended care.  Comment: Rehab Admissions Coordinator to follow up.  Thanks,  Meredith Staggers, MD, Mellody Drown     04/08/2014

## 2014-04-08 NOTE — Evaluation (Signed)
Speech Language Pathology Evaluation Patient Details Name: [name] MRN: [id number] DOB: 1951-02-13 Today's Date: 04/08/2014 Time: 0810-0825 SLP Time Calculation (min) (ACUTE ONLY): 15 min  Problem List:  Patient Active Problem List   Diagnosis Date Noted  . ICH (intracerebral hemorrhage) 04/07/2014  . Bilateral carotid artery disease 03/04/2014  . Acute cor pulmonale 01/14/2014  . RVF (right ventricular failure) 01/14/2014  . Hypotension 10/23/2013  . Chronic combined systolic and diastolic heart failure 63/33/5456  . Back pain, spinal stenosis 09/30/2013  . Alcohol abuse 09/28/2013  . 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 Chronic anticoagulation 11/13/2012  . Long term (current) use of anticoagulants, Eliquis 10/23/2012  . Chronic a-fib- failed Amio-DCCV  10/23/2012  . Gout flare. 09/29/12, Lt knee, improved with colchicine. 09/30/2012  . Aortic stenosis, severe- Tissue AVR 5/14 09/23/2012  . CAD, CABG X 2 with SVG-PD/PL 5/14 09/23/2012  . Cellulitis, lower extremity- treated-May 2015 09/23/2012  . PAD (peripheral artery disease), decreased bil. ABIs 09/23/2012  . COPD on home oxygen 09/23/2012  . Acute respiratory failure, with BiPAP needed on admit 09/16/2012  . PAF - Maze at the time of his CABG/AVR 5/14. Recurrent a fib On Amiodarone, DCCV this admit with recurrent a fib, now persistent a fib 09/16/2012  . HTN (hypertension) 09/16/2012  . Hyperlipidemia 09/16/2012  . Noncompliance, no meds for two weeks prior to admission 09/16/2012  . Tobacco use 09/16/2012  . Acute combined systolic and diastolic CHF - EF  25-63% June 2015  09/16/2012  . Bilateral lower extremity edema, secondary to chronic CHF 09/16/2012   Past Medical History:  Past Medical History  Diagnosis Date  . Hypertension   . Gout   . Aortic stenosis     echo 06/19/08 with nomal LV function, moderate concentric LVH moderate aortic stenosis area 0.99 cm squared, peak gradient of 50 and mean of  31  . Hyperlipidemia   . Atrial fibrillation with RVR, was in atrial fib in 04/2011 09/16/2012    a) s/p MAZE (09/2012)   . Tobacco use 09/16/2012  . Heart murmur   . Chronic combined systolic and diastolic CHF (congestive heart failure) 09/16/2012    a) ECHO (08/2013) EF 50-55%, grade II DD b) TEE ECHO (09/2013): 40-45%, bioprosthesis present, mild MR  . Aortic stenosis, severe 09/23/2012  . CAD (coronary artery disease), single vessel disease 09/23/2012    a) CABG (SVG to PDA and PL) wtih AVR (09/2012)   . PAD (peripheral artery disease), decreased bil. ABIs 09/23/2012    a) ABIs (11/2013): Right mild arterial insufficiency, L normal; aroto-bifem bypass graft: not well visualized, bilateral SFAs = to greater than 50% in dimaeter reduction   . Gout flare. 09/29/12, Lt knee, improved with colchicine. 09/30/2012  . H/O aortic valve replacement   . AS (aortic stenosis) with AVR with pericardial tissue valve  09/30/2013  . Back pain, spinal stenosis 09/30/2013  . COPD (chronic obstructive pulmonary disease)   . Bilateral carotid artery disease    Past Surgical History:  Past Surgical History  Procedure Laterality Date  . Iliac artery stent  10/20/08    stent to lt iliac  . Aorto bifem bypass  12/18/08    by Dr. Trula Slade  . Coronary artery bypass graft N/A 10/08/2012    Procedure: CORONARY ARTERY BYPASS GRAFTING (CABG);  Surgeon: Grace Isaac, MD;  Location: [place];  Service: Open Heart Surgery;  Laterality: N/A;  Coronary Artery bypass Graft times two utilizing the left greater saphenous vein  harvested endoscopically  . Aortic valve replacement N/A 10/08/2012    Procedure: AORTIC VALVE REPLACEMENT (AVR);  Surgeon: Grace Isaac, MD;  Location: [place];  Service: Open Heart Surgery;  Laterality: N/A;  . Maze N/A 10/08/2012    Procedure: MAZE;  Surgeon: Grace Isaac, MD;  Location: [place];  Service: Open Heart Surgery;  Laterality: N/A;  . Intraoperative transesophageal echocardiogram N/A 10/08/2012     Procedure: INTRAOPERATIVE TRANSESOPHAGEAL ECHOCARDIOGRAM;  Surgeon: Grace Isaac, MD;  Location: [place];  Service: Open Heart Surgery;  Laterality: N/A;  . Endovein harvest of greater saphenous vein Bilateral 10/08/2012    Procedure: ENDOVEIN HARVEST OF GREATER SAPHENOUS VEIN;  Surgeon: Grace Isaac, MD;  Location: [place];  Service: Open Heart Surgery;  Laterality: Bilateral;  . US echocardiography  06/20/2011    mod concentric LVH,LA severely dilated,RA mildly dilated,mod. ca+ of the mitral apparatus,trace MR,mod. ca+ AOV w/stenosis.  Marland Kitchen Nm myocar perf wall motion  08/25/2008    normal  . Cardioversion N/A 02/25/2013    Procedure: CARDIOVERSION;  Surgeon: Sanda Klein, MD;  Location: [place];  Service: Cardiovascular;  Laterality: N/A;  . Tee without cardioversion N/A 09/25/2013    Procedure: TRANSESOPHAGEAL ECHOCARDIOGRAM (TEE);  Surgeon: Thayer Headings, MD;  Location: [place];  Service: Cardiovascular;  Laterality: N/A;  . Cardioversion N/A 09/25/2013    Procedure: CARDIOVERSION;  Surgeon: Thayer Headings, MD;  Location: [facility] ENDOSCOPY;  Service: Cardiovascular;  Laterality: N/A;  . Left and right heart catheterization with coronary angiogram N/A 09/20/2012    Procedure: LEFT AND RIGHT HEART CATHETERIZATION WITH CORONARY ANGIOGRAM;  Surgeon: Lorretta Harp, MD;  Location: [facility] CATH LAB;  Service: Cardiovascular;  Laterality: N/A;   HPI:  63 y.o. male who has known Afib, COPD, aortic valve replacement, HTN, PAD admitted after sudden  dizziness and weakness on the left arm>leg. CT head showed a right IC basal ganglia hemorrhage. No CXR performed.     Assessment / Plan / Recommendation Clinical Impression  Pt's basic cognitive function appeared intact. He demonstrated mild difficulty with word recall and complex cognitive information and would benefit from further ST to maximize executive functioning as pt lives alone.    SLP Assessment  Patient needs continued Speech Lanaguage  Pathology Services    Follow Up Recommendations   (TBD)    Frequency and Duration min 2x/week  2 weeks   Pertinent Vitals/Pain Pain Assessment: No/denies pain   SLP Goals  Potential to Achieve Goals (ACUTE ONLY): Good  SLP Evaluation Prior Functioning  Cognitive/Linguistic Baseline: Within functional limits (no family present) Type of Home: Apartment  Lives With: Alone Available Help at Discharge: Family;Available PRN/intermittently Vocation: Retired   Associate Professor  Overall Cognitive Status: Impaired/Different from baseline Arousal/Alertness: Awake/alert Orientation Level: Oriented X4 Attention: Sustained Sustained Attention: Appears intact Memory: Impaired Memory Impairment: Retrieval deficit;Decreased recall of new information Awareness: Appears intact Problem Solving: Appears intact Safety/Judgment: Appears intact (will further assess)    Comprehension  Auditory Comprehension Overall Auditory Comprehension: Appears within functional limits for tasks assessed Visual Recognition/Discrimination Discrimination: Not tested Reading Comprehension Reading Status: Within funtional limits    Expression Expression Primary Mode of Expression: Verbal Verbal Expression Overall Verbal Expression: Appears within functional limits for tasks assessed Pragmatics: No impairment Written Expression Dominant Hand: Right Written Expression: Within Functional Limits   Oral / Motor Oral Motor/Sensory Function Overall Oral Motor/Sensory Function: Impaired Labial ROM: Reduced left Labial Symmetry: Abnormal symmetry left Labial Strength: Other (Comment) Labial Sensation: Reduced Lingual  ROM: Within Functional Limits Lingual Symmetry: Within Functional Limits Lingual Strength: Within Functional Limits Facial ROM: Within Functional Limits Facial Symmetry: Within Functional Limits Facial Sensation: Reduced Mandible: Within Functional Limits Motor Speech Overall Motor Speech: Appears  within functional limits for tasks assessed Intelligibility: Intelligible   GO     [name] 04/08/2014, 2:41 PM   Orbie Pyo Colvin Caroli.[employer] [phone]

## 2014-04-09 LAB — GLUCOSE, CAPILLARY
GLUCOSE-CAPILLARY: 126 mg/dL — AB (ref 70–99)
Glucose-Capillary: 152 mg/dL — ABNORMAL HIGH (ref 70–99)
Glucose-Capillary: 90 mg/dL (ref 70–99)

## 2014-04-09 LAB — COMPREHENSIVE METABOLIC PANEL
ALT: 26 U/L (ref 0–53)
AST: 84 U/L — ABNORMAL HIGH (ref 0–37)
Albumin: 2.7 g/dL — ABNORMAL LOW (ref 3.5–5.2)
Alkaline Phosphatase: 66 U/L (ref 39–117)
Anion gap: 10 (ref 5–15)
BUN: 18 mg/dL (ref 6–23)
CALCIUM: 8.9 mg/dL (ref 8.4–10.5)
CO2: 31 mEq/L (ref 19–32)
CREATININE: 1.35 mg/dL (ref 0.50–1.35)
Chloride: 100 mEq/L (ref 96–112)
GFR calc non Af Amer: 54 mL/min — ABNORMAL LOW (ref >90)
GFR, EST AFRICAN AMERICAN: 63 mL/min — AB (ref >90)
Glucose, Bld: 128 mg/dL — ABNORMAL HIGH (ref 70–99)
Potassium: 3.9 mEq/L (ref 3.7–5.3)
SODIUM: 141 meq/L (ref 137–147)
TOTAL PROTEIN: 6.7 g/dL (ref 6.0–8.3)
Total Bilirubin: 0.4 mg/dL (ref 0.3–1.2)

## 2014-04-09 LAB — CBC WITH DIFFERENTIAL/PLATELET
Basophils Absolute: 0 10*3/uL (ref 0.0–0.1)
Basophils Relative: 0 % (ref 0–1)
EOS ABS: 0.1 10*3/uL (ref 0.0–0.7)
EOS PCT: 2 % (ref 0–5)
HCT: 34.2 % — ABNORMAL LOW (ref 39.0–52.0)
Hemoglobin: 10.3 g/dL — ABNORMAL LOW (ref 13.0–17.0)
LYMPHS ABS: 1.9 10*3/uL (ref 0.7–4.0)
Lymphocytes Relative: 32 % (ref 12–46)
MCH: 26.6 pg (ref 26.0–34.0)
MCHC: 30.1 g/dL (ref 30.0–36.0)
MCV: 88.4 fL (ref 78.0–100.0)
MONO ABS: 0.7 10*3/uL (ref 0.1–1.0)
Monocytes Relative: 11 % (ref 3–12)
Neutro Abs: 3.3 10*3/uL (ref 1.7–7.7)
Neutrophils Relative %: 55 % (ref 43–77)
PLATELETS: 177 10*3/uL (ref 150–400)
RBC: 3.87 MIL/uL — AB (ref 4.22–5.81)
RDW: 17 % — ABNORMAL HIGH (ref 11.5–15.5)
WBC: 6.1 10*3/uL (ref 4.0–10.5)

## 2014-04-09 MED ORDER — COLCHICINE 0.6 MG PO TABS
0.6000 mg | ORAL_TABLET | Freq: Two times a day (BID) | ORAL | Status: DC
  Administered 2014-04-09 – 2014-04-13 (×9): 0.6 mg via ORAL
  Filled 2014-04-09 (×10): qty 1

## 2014-04-09 MED ORDER — RESOURCE THICKENUP CLEAR PO POWD
ORAL | Status: DC | PRN
  Filled 2014-04-09: qty 125

## 2014-04-09 MED ORDER — HYDROMORPHONE HCL 2 MG PO TABS
2.0000 mg | ORAL_TABLET | ORAL | Status: DC | PRN
  Administered 2014-04-09: 2 mg via ORAL
  Filled 2014-04-09: qty 1

## 2014-04-09 MED ORDER — PANTOPRAZOLE SODIUM 40 MG PO TBEC
40.0000 mg | DELAYED_RELEASE_TABLET | Freq: Every day | ORAL | Status: DC
  Administered 2014-04-09 – 2014-04-12 (×4): 40 mg via ORAL
  Filled 2014-04-09 (×4): qty 1

## 2014-04-09 NOTE — Evaluation (Signed)
Occupational Therapy Evaluation Patient Details Name: [name] MRN: [id number] DOB: [birth year] Today's Date: 04/09/2014    History of Present Illness 63 yo male admitted due to sudden dizziness and weakness on L UE/ LE. Pt s/p fall with (A) from friends to floor with skin tear. pt with slurred speech. CT (+) R basal ganglia with internal capsula . Further workup pending. PMH: AFib,COPD, HTN, CAD, aortic stenosis s/p valve replacement, gout, PAD,   Clinical Impression   Pt admitted with above. He demonstrates the below listed deficits and will benefit from continued OT to maximize safety and independence with BADLs.  Pt presents to OT with Lt. Hemiparesis with significant sensory impairment.   He has poor proprioceptive awareness.  He was able to perform simple grooming EOB with min guard assist today.  He requires mod - total A with all other aspects of ADLs.   He is very motivated and daughter is very supportive.  Recommend CIR.      Follow Up Recommendations  CIR    Equipment Recommendations  3 in 1 bedside comode;Tub/shower bench    Recommendations for Other Services       Precautions / Restrictions Precautions Precautions: Fall      Mobility Bed Mobility Overal bed mobility: Needs Assistance Bed Mobility: Supine to Sit;Sit to Supine     Supine to sit: Mod assist Sit to supine: Mod assist   General bed mobility comments: Pt provided assist with moving LEs off EOB, and asisst to lift trunk.  Inhibition to prevent over use and pushing with Rt. UE  Transfers Overall transfer level: Needs assistance               General transfer comment: Pt too fatigued to attempt transfer.  He was able to scoot up EOB with mod A with faciliatation for trunk flexion  and control and inhibition to prevent pushing/over use Lt. UE    Balance Overall balance assessment: Needs assistance Sitting-balance support: Feet supported Sitting balance-Leahy Scale: Fair Sitting balance  - Comments: Pt maintained EOB sitting with min A initiiatlly as he leans to Lt.  With inhibitory techniques, pt able to right posture with visual and verbal cues, progressed to min A.  He was able to perform reaching to Rt and to the lt with the Rt UE - worked on faciliation of trunk rotation to the Rt. with min guard assist and verbal and visual cues.  He was able to reach forward with Lt. UE with  min guard assist.  Pt performed grooming tasks EOB with min guard assist  Postural control: Left lateral lean                                  ADL Overall ADL's : Needs assistance/impaired Eating/Feeding: Set up;Bed level;Sitting   Grooming: Wash/dry hands;Wash/dry face;Oral care;Brushing hair;Min guard;Sitting Grooming Details (indicate cue type and reason): sitting EOB  Upper Body Bathing: Moderate assistance;Sitting   Lower Body Bathing: Maximal assistance;Bed level   Upper Body Dressing : Maximal assistance;Sitting   Lower Body Dressing: Total assistance;Bed level   Toilet Transfer: Total assistance Toilet Transfer Details (indicate cue type and reason): did not attempt due to pt fatigued Toileting- Clothing Manipulation and Hygiene: Total assistance;Bed level         General ADL Comments: Pt is very motivated.  he was able to perofrm simple grooming tasks EOB with min guard assist and cues for trunk  control and balance     Vision Eye Alignment: Within Functional Limits Alignment/Gaze Preference: Within Defined Limits Ocular Range of Motion: Within Functional Limits Tracking/Visual Pursuits: Able to track stimulus in all quads without difficulty             Perception Perception Perception Tested?: Yes Comments: Pt with impaired midline awareness due to profound sensory deficits    Praxis      Pertinent Vitals/Pain Pain Assessment: Faces Faces Pain Scale: Hurts little more Pain Location: back and Rt. knee Pain Descriptors / Indicators: Aching Pain  Intervention(s): Monitored during session;Repositioned;RN gave pain meds during session     Hand Dominance Right   Extremity/Trunk Assessment Upper Extremity Assessment Upper Extremity Assessment: LUE deficits/detail LUE Deficits / Details: Pt with weakness Lt. UE - strength grossly 2+/5 - 3/5.  He dysmetria noted and pt with absent sensation resulting in very poor control of Lt. UE LUE Sensation: decreased light touch;decreased proprioception LUE Coordination: decreased fine motor;decreased gross motor   Lower Extremity Assessment Lower Extremity Assessment: Defer to PT evaluation   Cervical / Trunk Assessment Cervical / Trunk Assessment: Other exceptions Cervical / Trunk Exceptions: Pt with passive lateral flexion on Lt. Rt shoulder elevated with elongation Rt. trunk.  Pt with difficulty isolating uppoer and lower trunk movements    Communication Communication Communication: No difficulties   Cognition Arousal/Alertness: Awake/alert Behavior During Therapy: WFL for tasks assessed/performed Overall Cognitive Status: Within Functional Limits for tasks assessed                     General Comments       Exercises       Shoulder Instructions      Home Living Family/patient expects to be discharged to:: Inpatient rehab Living Arrangements: Alone Available Help at Discharge: Family;Available PRN/intermittently Type of Home: Apartment                              Lives With: Alone    Prior Functioning/Environment Level of Independence: Independent with assistive device(s)        Comments: pt used a RW 2/2 back pain    OT Diagnosis: Generalized weakness;Hemiplegia non-dominant side;Ataxia   OT Problem List: Decreased strength;Decreased range of motion;Decreased activity tolerance;Impaired balance (sitting and/or standing);Decreased coordination;Decreased safety awareness;Decreased knowledge of use of DME or AE;Impaired sensation;Impaired UE  functional use   OT Treatment/Interventions: Self-care/ADL training;Neuromuscular education;DME and/or AE instruction;Patient/family education;Balance training    OT Goals(Current goals can be found in the care plan section) Acute Rehab OT Goals Patient Stated Goal: Back home OT Goal Formulation: With patient/family Time For Goal Achievement: 04/23/14 Potential to Achieve Goals: Good ADL Goals Pt Will Perform Upper Body Bathing: with min guard assist;sitting Pt Will Perform Lower Body Bathing: with min assist;sit to/from stand Pt Will Perform Upper Body Dressing: with min guard assist;sitting Pt Will Perform Lower Body Dressing: with min assist;sit to/from stand Pt Will Transfer to Toilet: with min assist;bedside commode Pt Will Perform Toileting - Clothing Manipulation and hygiene: with min assist;sit to/from stand Additional ADL Goal #1: Pt will use Lt. UE as a gross assist consistently during ADLs  OT Frequency: Min 2X/week   Barriers to D/C: Decreased caregiver support          Co-evaluation              End of Session Equipment Utilized During Treatment: Oxygen Nurse Communication: Mobility status  Activity Tolerance: Patient  tolerated treatment well Patient left: in bed;with call bell/phone within reach;with family/visitor present   Time: 1610-9604 OT Time Calculation (min): 41 min Charges:  OT General Charges $OT Visit: 1 Procedure OT Evaluation $Initial OT Evaluation Tier I: 1 Procedure OT Treatments $Neuromuscular Re-education: 23-37 mins G-Codes:    Deano Tomaszewski M 2014-04-26, 2:02 PM

## 2014-04-09 NOTE — Progress Notes (Signed)
Rehab admissions - Evaluated for possible admission.  I met with patient and his daughter.  Patient lives alone.  Dtr and son out of town.  Is divorced.  Patient and daughter would like inpatient rehab here at [facility].  I will open the case and fax to insurance carrier requesting acute inpatient rehab admission.  I will follow up tomorrow for plans.  Call me for questions.  #[phone]

## 2014-04-09 NOTE — PMR Pre-admission (Signed)
PMR Admission Coordinator Pre-Admission Assessment  Patient: [name] is an 63 y.o., male MRN: [id number] DOB: Oct 16, 1950 Height: _0  (177.8 cm) Weight: 103.4 kg (227 lb 15.3 oz)              Insurance Information HMO:      PPO: Yes     PCP:       IPA:       80/20:       OTHER:  Group [id number] PRIMARY: [employer] [place] PPO      Policy#: [id number]      Subscriber: Elyse Hsu Approval given on 04-10-14 from Doroteo Glassman, RN from 04-11-14 through 04-26-14. Updates due to V[place] on 04-26-14. CM Name: Doroteo Glassman, RN      Phone#: [phone], ext. [phone]     Fax#: [phone] Pre-Cert#: [id number]      Employer: Retired Benefits:  Phone #: [phone]     Name: online Eff. Date: 11-22-13     Deduct: $5500 (met all)      Out of Pocket Max: $5500 (met all)      Life Max: none CIR: 100%      SNF: 100%, 60  Day visit max Outpatient: 100%     Co-Pay: none, 30 day visit limit Home Health: 100%      Co-Pay: none, 30 day visit limit DME: 100%     Co-Pay: none Providers: in network  Note: that both deductible and OOP Max have been met, thus all eligible services are now covered at 100%.   Emergency Contact Information Contact Information    Name Relation Home Work Luis Llorens Torres Daughter [phone]     Gertrude, Tarbet [phone]  [phone]   Danella Deis [phone]  [phone]     Current Medical History  Patient Admitting Diagnosis:  R BG/IC hemorrhage  History of Present Illness:  A 63 y.o. right handed male history of hypertension, atrial fibrillation maintained on Eliquis status post MAZE June 2014. Independent prior to admission. Presented 04/07/2014 with left-sided weakness and mild slurred speech. Cranial CT scan showed a right IC/ basal ganglia hemorrhage with a 4 mm right to left midline shift. Blood pressure 158/85.Eliquis discontinued in light of intracranial hemorrhage. The patient did not receive TPA. Close monitoring of blood pressure.  Recent carotid Dopplers 02/25/2014 revealed 70% left ICA and 50-69% right ICA stenosis. Follow-up cranial CT scan 04/08/2014 pending. Patient is tolerating a regular consistency diet. Physical therapy evaluation completed 04/08/2014 with recommendations of physical medicine rehabilitation consultation.    Total: 5=NIH  Past Medical History  Past Medical History  Diagnosis Date  . Hypertension   . Gout   . Aortic stenosis     echo 06/19/08 with nomal LV function, moderate concentric LVH moderate aortic stenosis area 0.99 cm squared, peak gradient of 50 and mean of 31  . Hyperlipidemia   . Atrial fibrillation with RVR, was in atrial fib in 04/2011 09/16/2012    a) s/p MAZE (09/2012)   . Tobacco use 09/16/2012  . Heart murmur   . Chronic combined systolic and diastolic CHF (congestive heart failure) 09/16/2012    a) ECHO (08/2013) EF 50-55%, grade II DD b) TEE ECHO (09/2013): 40-45%, bioprosthesis present, mild MR  . Aortic stenosis, severe 09/23/2012  . CAD (coronary artery disease), single vessel disease 09/23/2012    a) CABG (SVG to PDA and PL) wtih AVR (09/2012)   . PAD (peripheral artery disease), decreased bil. ABIs 09/23/2012    a) ABIs (11/2013): Right  mild arterial insufficiency, L normal; aroto-bifem bypass graft: not well visualized, bilateral SFAs = to greater than 50% in dimaeter reduction   . Gout flare. 09/29/12, Lt knee, improved with colchicine. 09/30/2012  . H/O aortic valve replacement   . AS (aortic stenosis) with AVR with pericardial tissue valve  09/30/2013  . Back pain, spinal stenosis 09/30/2013  . COPD (chronic obstructive pulmonary disease)   . Bilateral carotid artery disease     Family History  family history includes Heart attack in his brother, brother, and father; Heart disease in his brother.  Prior Rehab/Hospitalizations:  Had cardiac rehab last summer.  Was at [facility] for 2-3  weeks.   Current Medications  Current facility-administered medications: 0.9 %  sodium chloride infusion, , Intravenous, Continuous, Kathrynn Ducking, MD, Last Rate: 50 mL/hr at 04/10/14 1647;  acetaminophen (TYLENOL) tablet 650 mg, 650 mg, Oral, Q4H PRN, 650 mg at 04/09/14 1054 **OR** acetaminophen (TYLENOL) suppository 650 mg, 650 mg, Rectal, Q4H PRN, Marliss Coots, PA-C antiseptic oral rinse (CPC / CETYLPYRIDINIUM CHLORIDE 0.05%) solution 7 mL, 7 mL, Mouth Rinse, BID, Antony Contras, MD, 7 mL at 04/13/14 0947;  atorvastatin (LIPITOR) tablet 80 mg, 80 mg, Oral, q1800, Donzetta Starch, NP, 80 mg at 04/12/14 1721;  budesonide-formoterol (SYMBICORT) 80-4.5 MCG/ACT inhaler 2 puff, 2 puff, Inhalation, BID, Marliss Coots, PA-C, 2 puff at 04/13/14 0754 carvedilol (COREG) tablet 12.5 mg, 12.5 mg, Oral, BID WC, Donzetta Starch, NP, 12.5 mg at 04/13/14 1829;  colchicine tablet 0.6 mg, 0.6 mg, Oral, BID, Kathrynn Ducking, MD, 0.6 mg at 04/13/14 9371;  digoxin (LANOXIN) tablet 0.25 mg, 0.25 mg, Oral, SEIBERT, KEETER, PA-C, 0.25 mg at 04/13/14 6967;  HYDROmorphone (DILAUDID) tablet 2 mg, 2 mg, Oral, Q3H PRN, Kathrynn Ducking, MD, 2 mg at 04/09/14 1250 labetalol (NORMODYNE,TRANDATE) injection 10-40 mg, 10-40 mg, Intravenous, Q10 min PRN, Marliss Coots, PA-C, 20 mg at 04/07/14 2234;  metolazone (ZAROXOLYN) tablet 2.5 mg, 2.5 mg, Oral, Q M,W,F, Donzetta Starch, NP, 2.5 mg at 04/13/14 0946;  pantoprazole (PROTONIX) EC tablet 40 mg, 40 mg, Oral, Daily, Antony Contras, MD, 40 mg at 04/12/14 2217 polyethylene glycol (MIRALAX / GLYCOLAX) packet 17 g, 17 g, Oral, Daily, Kathrynn Ducking, MD, 17 g at 04/13/14 0947;  potassium chloride SA (K-DUR,KLOR-CON) CR tablet 20 mEq, 20 mEq, Oral, Daily, Donzetta Starch, NP, 20 mEq at 04/13/14 0946;  RESOURCE THICKENUP CLEAR, , Oral, PRN, Antony Contras, MD;  senna-docusate (Senokot-S) tablet 1 tablet, 1 tablet, Oral, BID, Marliss Coots, PA-C, 1 tablet at 04/13/14 8938 spironolactone (ALDACTONE)  tablet 25 mg, 25 mg, Oral, Daily, Donzetta Starch, NP, 25 mg at 04/13/14 0946;  tiZANidine (ZANAFLEX) tablet 8 mg, 8 mg, Oral, BID, Donzetta Starch, NP, 8 mg at 04/13/14 0946;  torsemide (DEMADEX) tablet 20 mg, 20 mg, Oral, q1800, Antony Contras, MD, 20 mg at 04/12/14 1721;  torsemide (DEMADEX) tablet 40 mg, 40 mg, Oral, QAC breakfast, Antony Contras, MD, 40 mg at 04/13/14 1017  Patients Current Diet: DIET DYS 3  Precautions / Restrictions Precautions Precautions: Fall Restrictions Weight Bearing Restrictions: No   Prior Activity Level Community (5-7x/wk): Goes out daily.  Has back problems.  Goes to MD appts a lot.  Used a RW PTA.   Home Assistive Devices / Equipment Home Assistive Devices/Equipment: Gilford Rile (specify type)  Prior Functional Level Prior Function Level of Independence: Independent with assistive device(s) Comments: pt used a RW 2/2 back pain  Current Functional Level Cognition  Arousal/Alertness: Awake/alert Overall Cognitive Status: Within Functional Limits for tasks assessed Orientation Level: Oriented X4 Attention: Sustained Sustained Attention: Appears intact Memory: Impaired Memory Impairment: Retrieval deficit, Decreased recall of new information Awareness: Appears intact Problem Solving: Appears intact Safety/Judgment: Appears intact (will further assess)    Extremity Assessment (includes Sensation/Coordination)  Upper Extremity Assessment: Defer to OT evaluation Lower Extremity Assessment: LLE deficits/detail LLE Deficits / Details: pt able to actively move LE with strength 2/5. pt with diminished soft touch and proprioception.  Cervical / Trunk Assessment: Kyphotic     ADLs  Overall ADL's : Needs assistance/impaired Eating/Feeding: Set up, Bed level, Sitting Grooming: Wash/dry hands, Wash/dry face, Oral care, Brushing hair, Min guard, Sitting Grooming Details (indicate cue type and reason): sitting EOB  Upper Body Bathing: Moderate assistance,  Sitting Lower Body Bathing: Maximal assistance, Bed level Upper Body Dressing : Maximal assistance, Sitting Lower Body Dressing: Total assistance, Bed level Toilet Transfer: Total assistance Toilet Transfer Details (indicate cue type and reason): did not attempt due to pt fatigued Toileting- Clothing Manipulation and Hygiene: Total assistance, Bed level General ADL Comments: Pt is very motivated.  he was able to perofrm simple grooming tasks EOB with min guard assist and cues for trunk control and balance    Mobility  Overal bed mobility: Needs Assistance, +2 for physical assistance Bed Mobility: Rolling Rolling: Mod assist, +2 for physical assistance Supine to sit: Mod assist Sit to supine: Mod assist General bed mobility comments: pt able to roll to L side with only ModA x1, but requires 2nd person for rolling to R side.      Transfers  Overall transfer level: Needs assistance Equipment used: 2 person hand held assist Transfers: Sit to/from Stand Sit to Stand: Max assist, +2 physical assistance Stand pivot transfers: Total assist, +2 physical assistance General transfer comment: Utilized [place] for OOB to recliner and trialed standing from recliner.  pt having difficulty coming to full standing and remains very flexed today.  pt doing better at attending to balance and attempts to correct L lateral lean.      Ambulation / Gait / Stairs / Wheelchair Mobility    Not assessed at this time. Anticipate needs.   Posture / Balance Dynamic Sitting Balance Sitting balance - Comments: pt is able to utilize R UE for reaching tasks and can maintain balance for brief periods of time.  pt able to work on lateral weight shifting in sitting with propping on R elbow and L elbow.  pt demos improvement in pt's ability to maintain balance and move trunk.      Special needs/care consideration BiPAP/CPAP No CPM No Continuous Drip IV 0.9% NS 75 ml/hr Dialysis No        Life Vest No Oxygen Yes,  Currently on 2L by [place]. On O2 1 L [place] resting at home and 2 L when up ambulating Special Bed No Trach Size No Wound Vac (area) No     Skin Has a scab on left knee, dressing left leg where leg is "weeping", bruises on arms   Bowel mgmt: Last BM 04/07/14 Bladder mgmt: Voiding in urinal Diabetic mgmt No    Previous Home Environment Living Arrangements: Alone  Lives With: Alone Available Help at Discharge: Family, Available PRN/intermittently Type of Home: [place]: Yes Type of Home Care Services: Other (Comment) (O2 supply)  Discharge Living Setting Plans for Discharge Living Setting: Alone, Apartment Type of Home at Discharge: Apartment Discharge Home Layout:  One level Discharge Home Access: Level entry Does the patient have any problems obtaining your medications?: No  Social/Family/Support Systems Patient Roles: Parent (Divorced.  Has a son and a Dtr out of town.) Contact Information: Gwynneth Aliment - daughter [phone] Anticipated Caregiver: self, lives alone Ability/Limitations of Caregiver: Dtr in [place] and son in [place] Caregiver Availability: Other (Comment) (May need SNF after rehab due to limited caregiver support.) Discharge Plan Discussed with Primary Caregiver: Yes Is Caregiver In Agreement with Plan?: Yes Does Caregiver/Family have Issues with Lodging/Transportation while Pt is in Rehab?: No  Goals/Additional Needs Patient/Family Goal for Rehab: PT/OT S/Min assist, ST S/Mod I goals Expected length of stay: 18-24 days Cultural Considerations: None Dietary Needs: Dys 3, nectar thick liquids Equipment Needs: TBD Pt/Family Agrees to Admission and willing to participate: Yes Program Orientation Provided & Reviewed with Pt/Caregiver Including Roles  & Responsibilities: Yes  Decrease burden of Care through IP rehab admission: N/A  Possible need for SNF placement upon discharge: Yes, limited caregiver support.  May need SNF after rehab if  he cannot progress sufficiently for discharge home.  Patient Condition: This patient's medical and functional status has changed since the consult dated: 04-09-14 in which the Rehabilitation Physician determined and documented that the patient's condition is appropriate for intensive rehabilitative care in an inpatient rehabilitation facility. See "History of Present Illness" (above) for medical update. Functional changes are: maximal assistance of 2 for limited sit to stand and stand pivot transfers and moderate to total assistance with self care skills. Patient's medical and functional status update has been discussed with the Rehabilitation physician and patient remains appropriate for inpatient rehabilitation. Will admit to inpatient rehab today.  Preadmission Screen Completed By:  Nanetta Batty, PT 04/13/2014 11:31 AM ______________________________________________________________________   Discussed status with Dr. Letta Pate on 04-13-14 at 1059 and received telephone approval for admission today.  Admission Coordinator:  Nanetta Batty, PT, time 1059/Date 04-13-14

## 2014-04-09 NOTE — Progress Notes (Signed)
Stroke Team Progress Note  HISTORY  [name] is an 63 y.o. male who has known Afib on Eliquis (last took pill at 0800 today). He was with his friends today at 1530 when he suddenly felt dizzy and then had weakness on the left arm>leg. He slumped to the left side and his friends helped ease him to the floor. He did sustain a skin tear on the left arm and left shin. EMS was called and due to noting left arm flaccidity and left facial droop --Code stroke was called. Initial CT head showed a right IC basal ganglia hemorrhage. BP 158/85. Order for [place] placed. Currently he is awake and alert in no distress. Complaining of left arm and leg weakness along with mild slurred speech.   Date last known well: Date: 04/07/2014 Time last known well: Time: 15:30 tPA Given: No: ICH Patient was not a TPA candidate secondary to [place]. He was admitted to the neuro ICU for further evaluation and treatment.  SUBJECTIVE No family is at bedside. The patient is bright and alert, fully cooperative. He has no complaints of headache, nausea or vomiting. No chest pain or shortness of breath. He has not had a recent bowel movement.  OBJECTIVE Most recent Vital Signs: Filed Vitals:   04/09/14 0400 04/09/14 0500 04/09/14 0600 04/09/14 0700  BP: 108/46 100/60 112/67   Pulse: 93 97 95 95  Temp:      TempSrc:      Resp: 22 22 19 22   Height:      Weight:      SpO2: 97% 97% 98% 99%   CBG (last 3)   Recent Labs  04/08/14 0811 04/08/14 1152 04/08/14 2203  GLUCAP 136* 192* 120*    IV Fluid Intake:   . sodium chloride 75 mL/hr at 04/09/14 0118    MEDICATIONS  . antiseptic oral rinse  7 mL Mouth Rinse BID  . atorvastatin  80 mg Oral q1800  . budesonide-formoterol  2 puff Inhalation BID  . carvedilol  12.5 mg Oral BID WC  . digoxin  0.25 mg Oral QODAY  . metolazone  2.5 mg Oral Q M,W,F  . pantoprazole (PROTONIX) IV  40 mg Intravenous QHS  . potassium chloride SA  20 mEq Oral Daily  .  senna-docusate  1 tablet Oral BID  . spironolactone  25 mg Oral Daily  . tiZANidine  8 mg Oral BID  . torsemide  20 mg Oral q1800  . torsemide  40 mg Oral QAC breakfast   PRN:  acetaminophen **OR** acetaminophen, labetalol  Diet:  Diet Carb Modified thin liquids Activity:  Bedrest DVT Prophylaxis:  SCD  CLINICALLY SIGNIFICANT STUDIES Basic Metabolic Panel:   Recent Labs Lab 04/07/14 1607 04/07/14 1619 04/09/14 0210  NA 141 141 141  K 3.8 3.5* 3.9  CL 100 100 100  CO2 29  --  31  GLUCOSE 99 103* 128*  BUN 30* 30* 18  CREATININE 1.29 1.50* 1.35  CALCIUM 9.1  --  8.9   Liver Function Tests:   Recent Labs Lab 04/07/14 1607 04/09/14 0210  AST 33 84*  ALT 21 26  ALKPHOS 78 66  BILITOT 0.4 0.4  PROT 7.8 6.7  ALBUMIN 3.3* 2.7*   CBC:   Recent Labs Lab 04/07/14 1607 04/07/14 1619 04/09/14 0210  WBC 6.9  --  6.1  NEUTROABS 3.6  --  3.3  HGB 10.9* 12.6* 10.3*  HCT 35.7* 37.0* 34.2*  MCV 86.9  --  88.4  PLT 215  --  177   Coagulation:   Recent Labs Lab 04/07/14 1607  LABPROT 14.9  INR 1.15   Cardiac Enzymes: No results for input(s): CKTOTAL, CKMB, CKMBINDEX, TROPONINI in the last 168 hours. Urinalysis:   Recent Labs Lab 04/07/14 1803  COLORURINE YELLOW  LABSPEC 1.009  PHURINE 5.5  GLUCOSEU NEGATIVE  HGBUR TRACE*  BILIRUBINUR NEGATIVE  KETONESUR NEGATIVE  PROTEINUR 30*  UROBILINOGEN 0.2  NITRITE NEGATIVE  LEUKOCYTESUR NEGATIVE   Lipid Panel    Component Value Date/Time   CHOL 197 12/31/2013 1102   TRIG 90 12/31/2013 1102   HDL 73 12/31/2013 1102   CHOLHDL 2.7 12/31/2013 1102   VLDL 18 12/31/2013 1102   LDLCALC 106* 12/31/2013 1102   HgbA1C  Lab Results  Component Value Date   HGBA1C 5.2 10/07/2012    Urine Drug Screen:      Component Value Date/Time   LABOPIA NONE DETECTED 04/07/2014 1803   COCAINSCRNUR NONE DETECTED 04/07/2014 1803   LABBENZ NONE DETECTED 04/07/2014 1803   AMPHETMU NONE DETECTED 04/07/2014 1803   THCU NONE  DETECTED 04/07/2014 1803   LABBARB NONE DETECTED 04/07/2014 1803    Alcohol Level:   Recent Labs Lab 04/07/14 1607  ETH <11    Ct Head Wo Contrast  04/08/2014   CLINICAL DATA:  Followup intracranial hemorrhage.  EXAM: CT HEAD WITHOUT CONTRAST  TECHNIQUE: Contiguous axial images were obtained from the base of the skull through the vertex without intravenous contrast.  COMPARISON:  04/07/2014  FINDINGS: Intraparenchymal hemorrhage in the right thalamus again measures 15 x 24 mm. There is mild surrounding edema. Small amount of intraventricular penetration remains evident with blood in the right lateral ventricle. Right-to-left shift is unchanged measuring 4 mm. Old infarction at the left temporoparietal junction region appears the same. No extra-axial collection. No subarachnoid hemorrhage. No evidence of neoplastic mass lesion. The calvarium is unremarkable. Sinuses are clear. Atherosclerotic calcification affects the major vessels at the base of the brain.  IMPRESSION: Stable exams inch yesterday. Right thalamic intraparenchymal hematoma again measured at 15 x 24 mm. Mild surrounding edema. Small amount of intraventricular hemorrhage, not increased and without hydrocephalus.   Electronically Signed   By: Nelson Chimes M.D.   On: 04/08/2014 15:24   Ct Head Wo Contrast  04/07/2014   CLINICAL DATA:  Stroke.  EXAM: CT HEAD WITHOUT CONTRAST  TECHNIQUE: Contiguous axial images were obtained from the base of the skull through the vertex without intravenous contrast.  COMPARISON:  None.  FINDINGS: There is a acute hemorrhage in the posterior RIGHT basal ganglia and posterior limb of the RIGHT internal capsule. Intraparenchymal hematoma measures 15 mm x 23 mm on axial imaging. This measures 25 mm craniocaudal.  The hemorrhage also extends to the posterior lateral aspect of the RIGHT thalamus. 4 mm of RIGHT to LEFT midline shift. There is intraventricular hemorrhage associated with RIGHT through from the  parenchymal hemorrhage. There is underlying atrophy and chronic ischemic white matter disease.  Focal low attenuation is present in the LEFT posterior temporal lobe adjacent to the LEFT lateral ventricle atrium. This is favored to represent asymmetric chronic ischemic white matter disease however on old infarct for even subacute ischemia could have this appearance. Little if any effacement of sulci suggests that this is a chronic finding.  The calvarium is intact. No hydrocephalus. The visible paranasal sinuses are within normal limits. RIGHT septal spur and septal deviation. Mastoid air cells clear. Intracranial atherosclerosis.  IMPRESSION: 1. Acute posterior RIGHT  basal ganglia and internal capsule hemorrhage measuring 15 mm x 23 mm. 2. 4 mm of RIGHT to LEFT midline shift. 3. Critical Value/emergent results were called by telephone at the time of interpretation on 04/07/2014 at 4:23 pm to Dr. Doy Mince, who verbally acknowledged these results. 4. Area of low attenuation in the posterior LEFT temporal lobe may represent subacute ischemia but the appearance is more suggestive of either an old infarct or asymmetric chronic ischemic white matter disease.   Electronically Signed   By: Dereck Ligas M.D.   On: 04/07/2014 16:24    CT of the brain 04/07/14:  IMPRESSION: 1. Acute posterior RIGHT basal ganglia and internal capsule hemorrhage measuring 15 mm x 23 mm. 2. 4 mm of RIGHT to LEFT midline shift. 3. Critical Value/emergent results were called by telephone at the time of interpretation on 04/07/2014 at 4:23 pm to Dr. Doy Mince, who verbally acknowledged these results. 4. Area of low attenuation in the posterior LEFT temporal lobe may represent subacute ischemia but the appearance is more suggestive of either an old infarct or asymmetric chronic ischemic white matter disease.   CT brain 04/08/14:  IMPRESSION: Stable exams inch yesterday. Right thalamic intraparenchymal hematoma again measured  at 15 x 24 mm. Mild surrounding edema. Small amount of intraventricular hemorrhage, not increased and without hydrocephalus.  MRI of the brain    MRA of the brain    2D Echocardiogram    Carotid Doppler    CXR    EKG  Ectopic atrial tachycardia, unifocal Nonspecific ST and T wave abnormality  Therapy Recommendations PT and OT  Physical Exam  General: The patient is alert and cooperative at the time of the examination. The patient is moderately obese.  Respiratory: Lung fields are clear  Abdomen: Obese, slightly tender with palpation  Cardiovascular: Occasional irregular heart rhythm, no obvious murmurs or rubs noted.  Skin: No significant peripheral edema is noted.   Neurologic Exam  Mental status: The patient is oriented x 3.  Cranial nerves: Facial symmetry is present. Speech is normal, no aphasia or dysarthria is noted. Extraocular movements are full. Visual fields are full. tongue protrudes in the midline.  Motor: The patient has good strength the right  extremities. on the left side, the left arm is now 4 minus/5 strength, the patient is able to elevate the left arm above the head, but there is poor grip with the left hand. The left leg can be elevated against gravity, 4 minus/5 strength.  Sensory examination: the patient has profound decreased soft touch and pinprick sensation on the left face, arm, and leg, almost anesthetic.  Coordination: The patient has good finger-nose-finger and heel-to-shin on the right, the patient is unable to perform with the left arm, has difficulty with the left leg.  Gait and station: The patient is at bedrest, gait was not tested.  Reflexes: Deep tendon reflexes are symmetric, with exception that the left biceps reflex was slightly elevated. .    ASSESSMENT Mr. JAICOB DIA is a 63 y.o. male presenting with a right basal ganglia her intercurrent hemorrhage, patient is on and coagulation with Eliquis. The anticoagulation was  reversed, the patient was admitted to the neuroscience intensive care unit for further evaluation. The patient is alert and cooperative at this time, but he has a significant left hemiparesis particular affecting the left arm.     Right basilar ganglia intracranial hemorrhage  Hypertension  severe aortic stenosis, status post tissue valve replacement  Atrial fibrillation on Eliquis on  admission  Dyslipidemia  COPD  Carotid artery disease  Coronary artery disease  Hospital day # 2   A carotid Doppler study was recently done on 02/25/2014, reveals 70% stenosis of the left internal carotid artery, 50-69% stenosis on the right internal carotid artery.  The patient has been stable overnight, has a left hemiparesis and left hemisensory deficit. There may be some improvement in the left arm strength. The patient is taking oral food and fluids.  TREATMENT/PLAN  CT scan of the head tomorrow   Physical and occupational therapy evaluation ongoing  Plans for CIR  The patient will go on aspirin at some point, anticoagulation was discontinued  Will begin to mobilize patient  SCD  Follow neurologic exam.  Transfer to floor today    04/09/2014 7:43 AM   Lenor Coffin [phone]

## 2014-04-09 NOTE — Progress Notes (Signed)
Physical Therapy Treatment Patient Details Name: [name] MRN: [id number] DOB: 05-Jul-1950 Today's Date: 04/09/2014    History of Present Illness 63 yo male admitted due to sudden dizziness and weakness on L UE/ LE. Pt s/p fall with (A) from friends to floor with skin tear. pt with slurred speech. CT (+) R basal ganglia with internal capsula . Further workup pending. PMH: AFib,COPD, HTN, CAD, aortic stenosis s/p valve replacement, gout, PAD,    PT Comments    Worked on using Stedy today to A with standing balance and keeping body in midline.  Pt requiring extensive A for maintaining balance and body in midline during sitting and standing activity.  Pt is strong pusher to L side.  Pt ed on use of visual input to A with maintaining midline.  Will continue to follow.    Follow Up Recommendations  CIR     Equipment Recommendations   (TBD)    Recommendations for Other Services       Precautions / Restrictions Precautions Precautions: Fall Restrictions Weight Bearing Restrictions: No    Mobility  Bed Mobility Overal bed mobility: Needs Assistance Bed Mobility: Supine to Sit     Supine to sit: Mod assist;HOB elevated     General bed mobility comments: pt attempts to A with mobility and is able to use momentum to A with bringing trunk up to sitting.    Transfers Overall transfer level: Needs assistance Equipment used:  Charlaine Dalton) Transfers: Sit to/from Stand Sit to Stand: Max assist;+2 physical assistance         General transfer comment: Michaelyn Barter today to A with coming to stand and pivot to recliner.  pt able to use R UE on stedy to A with pulling to stand and uses visual input of keeping his body midline of the stedy to A with minimizing L lateral lean.  pt needs facilitation and Max x2 to bring trunk more upright and hips forward to allow stedy flaps to close behind his hips.    Ambulation/Gait                 Stairs            Wheelchair  Mobility    Modified Rankin (Stroke Patients Only) Modified Rankin (Stroke Patients Only) Pre-Morbid Rankin Score: Slight disability Modified Rankin: Severe disability     Balance Overall balance assessment: Needs assistance Sitting-balance support: Single extremity supported;Feet supported Sitting balance-Leahy Scale: Zero Sitting balance - Comments: pt with poor awareness of midline and proprioception.  pt pushes to L side Postural control: Left lateral lean Standing balance support: Single extremity supported Standing balance-Leahy Scale: Zero                      Cognition Arousal/Alertness: Awake/alert Behavior During Therapy: WFL for tasks assessed/performed Overall Cognitive Status: Within Functional Limits for tasks assessed                      Exercises      General Comments        Pertinent Vitals/Pain Pain Assessment: No/denies pain    Home Living                      Prior Function            PT Goals (current goals can now be found in the care plan section) Acute Rehab PT Goals Patient Stated Goal: Back home PT Goal Formulation:  With patient Time For Goal Achievement: 04/22/14 Potential to Achieve Goals: Good Progress towards PT goals: Progressing toward goals    Frequency  Min 4X/week    PT Plan Current plan remains appropriate    Co-evaluation             End of Session Equipment Utilized During Treatment: Gait belt Charlaine Dalton) Activity Tolerance: Patient tolerated treatment well Patient left: in chair;with call bell/phone within reach     Time: 0902-0934 PT Time Calculation (min) (ACUTE ONLY): 32 min  Charges:  $Therapeutic Activity: 23-37 mins                    G CodesCatarina Hartshorn, [place] 04/09/2014, 10:32 AM

## 2014-04-09 NOTE — Progress Notes (Signed)
Speech Language Pathology Treatment: Dysphagia  Patient Details Name: [name] MRN: [id number] DOB: August 07, 1950 Today's Date: 04/09/2014 Time: 9767-3419 SLP Time Calculation (min) (ACUTE ONLY): 18 min  Assessment / Plan / Recommendation Clinical Impression  Skilled observation with portion of breakfast and daughter at bedside.  Pt continues to exhibit indications of decreased airway protection with thin liquids.  Educated pt and daughter of initial swallow assessment yesterday and baseline cough and increased frequency of coughing noted with thin liquids. Nectar thick juice trial appeared to mitigate symptoms. Pt recalled compensatory strategy independently but required min-mod verbal/visual cues to implement. Diet texture downgraded to Dys 3 and nectar thick with continued follow up for upgrade to thin versus objective study.   HPI HPI: 63 y.o. male who has known Afib, COPD, aortic valve replacement, HTN, PAD admitted after sudden  dizziness and weakness on the left arm>leg. CT head showed a right IC basal ganglia hemorrhage. No CXR performed.     Pertinent Vitals Pain Assessment: No/denies pain  SLP Plan  Continue with current plan of care    Recommendations Diet recommendations: Dysphagia 3 (mechanical soft);Nectar-thick liquid Liquids provided via: Cup;No straw Medication Administration: Whole meds with puree Supervision: Patient able to self feed;Full supervision/cueing for compensatory strategies Compensations: Slow rate;Small sips/bites;Multiple dry swallows after each bite/sip;Follow solids with liquid Postural Changes and/or Swallow Maneuvers: Seated upright 90 degrees;Upright 30-60 min after meal              Oral Care Recommendations: Oral care BID Follow up Recommendations:  (TBD) Plan: Continue with current plan of care    GO     [name] 04/09/2014, 12:00 PM   Orbie Pyo Colvin Caroli.Ed [employer] [phone]

## 2014-04-10 ENCOUNTER — Inpatient Hospital Stay (HOSPITAL_COMMUNITY): Payer: BC Managed Care – PPO

## 2014-04-10 LAB — GLUCOSE, CAPILLARY
GLUCOSE-CAPILLARY: 132 mg/dL — AB (ref 70–99)
Glucose-Capillary: 154 mg/dL — ABNORMAL HIGH (ref 70–99)
Glucose-Capillary: 119 mg/dL — ABNORMAL HIGH (ref 70–99)
Glucose-Capillary: 173 mg/dL — ABNORMAL HIGH (ref 70–99)
Glucose-Capillary: 112 mg/dL — ABNORMAL HIGH (ref 70–99)

## 2014-04-10 MED ORDER — POLYETHYLENE GLYCOL 3350 17 G PO PACK
17.0000 g | PACK | Freq: Every day | ORAL | Status: DC
  Administered 2014-04-10 – 2014-04-13 (×4): 17 g via ORAL
  Filled 2014-04-10 (×4): qty 1

## 2014-04-10 MED ORDER — SODIUM CHLORIDE 0.9 % IV SOLN
INTRAVENOUS | Status: DC
  Administered 2014-04-10: 17:00:00 via INTRAVENOUS

## 2014-04-10 NOTE — Progress Notes (Signed)
Stroke Team Progress Note  HISTORY  [name] is an 63 y.o. male who has known Afib on Eliquis (last took pill at 0800 today). He was with his friends today at 1530 when he suddenly felt dizzy and then had weakness on the left arm>leg. He slumped to the left side and his friends helped ease him to the floor. He did sustain a skin tear on the left arm and left shin. EMS was called and due to noting left arm flaccidity and left facial droop --Code stroke was called. Initial CT head showed a right IC basal ganglia hemorrhage. BP 158/85. Order for [place] placed. Currently he is awake and alert in no distress. Complaining of left arm and leg weakness along with mild slurred speech.   Date last known well: Date: 04/07/2014 Time last known well: Time: 15:30 tPA Given: No: ICH Patient was not a TPA candidate secondary to [place]. He was admitted to the neuro ICU for further evaluation and treatment.  SUBJECTIVE No family is at bedside. The patient is bright and alert, fully cooperative. He has no complaints of headache, nausea or vomiting. No chest pain or shortness of breath. He has not had a recent bowel movement.  OBJECTIVE Most recent Vital Signs: Filed Vitals:   04/10/14 0500 04/10/14 0600 04/10/14 0700 04/10/14 0750  BP: 112/60 107/64 102/66   Pulse: 104 115 116   Temp:    98 F (36.7 C)  TempSrc:    Oral  Resp: 24 25 20    Height:      Weight:      SpO2: 95% 98% 99%    CBG (last 3)   Recent Labs  04/09/14 1736 04/10/14 0059 04/10/14 0749  GLUCAP 90 154* 119*    IV Fluid Intake:   . sodium chloride 75 mL/hr at 04/09/14 0118    MEDICATIONS  . antiseptic oral rinse  7 mL Mouth Rinse BID  . atorvastatin  80 mg Oral q1800  . budesonide-formoterol  2 puff Inhalation BID  . carvedilol  12.5 mg Oral BID WC  . colchicine  0.6 mg Oral BID  . digoxin  0.25 mg Oral QODAY  . metolazone  2.5 mg Oral Q M,W,F  . pantoprazole  40 mg Oral Daily  . potassium chloride SA  20  mEq Oral Daily  . senna-docusate  1 tablet Oral BID  . spironolactone  25 mg Oral Daily  . tiZANidine  8 mg Oral BID  . torsemide  20 mg Oral q1800  . torsemide  40 mg Oral QAC breakfast   PRN:  acetaminophen **OR** acetaminophen, HYDROmorphone, labetalol, RESOURCE THICKENUP CLEAR  Diet:  DIET DYS 3 thin liquids Activity:  Up as tolerated DVT Prophylaxis:  SCD  CLINICALLY SIGNIFICANT STUDIES Basic Metabolic Panel:   Recent Labs Lab 04/07/14 1607 04/07/14 1619 04/09/14 0210  NA 141 141 141  K 3.8 3.5* 3.9  CL 100 100 100  CO2 29  --  31  GLUCOSE 99 103* 128*  BUN 30* 30* 18  CREATININE 1.29 1.50* 1.35  CALCIUM 9.1  --  8.9   Liver Function Tests:   Recent Labs Lab 04/07/14 1607 04/09/14 0210  AST 33 84*  ALT 21 26  ALKPHOS 78 66  BILITOT 0.4 0.4  PROT 7.8 6.7  ALBUMIN 3.3* 2.7*   CBC:   Recent Labs Lab 04/07/14 1607 04/07/14 1619 04/09/14 0210  WBC 6.9  --  6.1  NEUTROABS 3.6  --  3.3  HGB 10.9*  12.6* 10.3*  HCT 35.7* 37.0* 34.2*  MCV 86.9  --  88.4  PLT 215  --  177   Coagulation:   Recent Labs Lab 04/07/14 1607  LABPROT 14.9  INR 1.15   Cardiac Enzymes: No results for input(s): CKTOTAL, CKMB, CKMBINDEX, TROPONINI in the last 168 hours. Urinalysis:   Recent Labs Lab 04/07/14 1803  COLORURINE YELLOW  LABSPEC 1.009  PHURINE 5.5  GLUCOSEU NEGATIVE  HGBUR TRACE*  BILIRUBINUR NEGATIVE  KETONESUR NEGATIVE  PROTEINUR 30*  UROBILINOGEN 0.2  NITRITE NEGATIVE  LEUKOCYTESUR NEGATIVE   Lipid Panel    Component Value Date/Time   CHOL 197 12/31/2013 1102   TRIG 90 12/31/2013 1102   HDL 73 12/31/2013 1102   CHOLHDL 2.7 12/31/2013 1102   VLDL 18 12/31/2013 1102   LDLCALC 106* 12/31/2013 1102   HgbA1C  Lab Results  Component Value Date   HGBA1C 5.2 10/07/2012    Urine Drug Screen:      Component Value Date/Time   LABOPIA NONE DETECTED 04/07/2014 1803   COCAINSCRNUR NONE DETECTED 04/07/2014 1803   LABBENZ NONE DETECTED 04/07/2014  1803   AMPHETMU NONE DETECTED 04/07/2014 1803   THCU NONE DETECTED 04/07/2014 1803   LABBARB NONE DETECTED 04/07/2014 1803    Alcohol Level:   Recent Labs Lab 04/07/14 1607  ETH <11    Ct Head Wo Contrast  04/08/2014   CLINICAL DATA:  Followup intracranial hemorrhage.  EXAM: CT HEAD WITHOUT CONTRAST  TECHNIQUE: Contiguous axial images were obtained from the base of the skull through the vertex without intravenous contrast.  COMPARISON:  04/07/2014  FINDINGS: Intraparenchymal hemorrhage in the right thalamus again measures 15 x 24 mm. There is mild surrounding edema. Small amount of intraventricular penetration remains evident with blood in the right lateral ventricle. Right-to-left shift is unchanged measuring 4 mm. Old infarction at the left temporoparietal junction region appears the same. No extra-axial collection. No subarachnoid hemorrhage. No evidence of neoplastic mass lesion. The calvarium is unremarkable. Sinuses are clear. Atherosclerotic calcification affects the major vessels at the base of the brain.  IMPRESSION: Stable exams inch yesterday. Right thalamic intraparenchymal hematoma again measured at 15 x 24 mm. Mild surrounding edema. Small amount of intraventricular hemorrhage, not increased and without hydrocephalus.   Electronically Signed   By: [name] M.D.   On: 04/08/2014 15:24    CT of the brain 04/07/14:  IMPRESSION: 1. Acute posterior RIGHT basal ganglia and internal capsule hemorrhage measuring 15 mm x 23 mm. 2. 4 mm of RIGHT to LEFT midline shift. 3. Critical Value/emergent results were called by telephone at the time of interpretation on 04/07/2014 at 4:23 pm to Dr. Doy Mince, who verbally acknowledged these results. 4. Area of low attenuation in the posterior LEFT temporal lobe may represent subacute ischemia but the appearance is more suggestive of either an old infarct or asymmetric chronic ischemic white matter disease.   CT brain  04/08/14:  IMPRESSION: Stable exams inch yesterday. Right thalamic intraparenchymal hematoma again measured at 15 x 24 mm. Mild surrounding edema. Small amount of intraventricular hemorrhage, not increased and without hydrocephalus.  MRI of the brain    MRA of the brain    2D Echocardiogram    Carotid Doppler    CXR    EKG  Ectopic atrial tachycardia, unifocal Nonspecific ST and T wave abnormality  Therapy Recommendations PT and OT  Physical Exam  General: The patient is alert and cooperative at the time of the examination. The patient is moderately  obese.  Respiratory: Lung fields are clear  Abdomen: Obese, slightly tender with palpation  Cardiovascular: Occasional irregular heart rhythm, no obvious murmurs or rubs noted.  Skin: No significant peripheral edema is noted.   Neurologic Exam  Mental status: The patient is oriented x 3.  Cranial nerves: Facial symmetry is present. Speech is normal, no aphasia or dysarthria is noted. Extraocular movements are full. Visual fields are full. tongue protrudes in the midline.  Motor: The patient has good strength the right  extremities. on the left side, the left arm is now 4 minus/5 strength, the patient is able to elevate the left arm above the head, but there is poor grip with the left hand. The left leg can be elevated against gravity, 4 minus/5 strength.  Sensory examination: the patient has profound decreased soft touch and pinprick sensation on the left face, arm, and leg, almost anesthetic.  Coordination: The patient has good finger-nose-finger and heel-to-shin on the right, the patient has significant ataxia with the left upper extremity, has difficulty with the left leg.  Gait and station: The patient is at bedrest, gait was not tested.  Reflexes: Deep tendon reflexes are symmetric, with exception that the left biceps reflex was slightly elevated. .    ASSESSMENT Mr. DHRUVA ORNDOFF is a 63 y.o. male  presenting with a right basal ganglia her intercurrent hemorrhage, patient is on and coagulation with Eliquis. The anticoagulation was reversed, the patient was admitted to the neuroscience intensive care unit for further evaluation. The patient is alert and cooperative at this time, but he has a significant left hemiparesis particular affecting the left arm.     Right basilar ganglia intracranial hemorrhage  Hypertension  severe aortic stenosis, status post tissue valve replacement  Atrial fibrillation on Eliquis on admission  Dyslipidemia  COPD  Carotid artery disease  Coronary artery disease  Hospital day # 3   A carotid Doppler study was recently done on 02/25/2014, reveals 70% stenosis of the left internal carotid artery, 50-69% stenosis on the right internal carotid artery.  The patient has been stable overnight, has a left hemiparesis and left hemisensory deficit. There may be some improvement in the left arm strength. The patient is taking oral food and fluids.  TREATMENT/PLAN    Physical and occupational therapy evaluation ongoing  Plans for CIR  The patient will go on aspirin at some point, anticoagulation was discontinued  Will begin to mobilize patient  SCD  Follow neurologic exam.  Transfer to floor today, unable to find a bed yesterday.    04/10/2014 8:43 AM   Lenor Coffin [phone]

## 2014-04-10 NOTE — Progress Notes (Signed)
Physical Therapy Treatment Patient Details Name: [name] MRN: [id number] DOB: December 06, 1950 Today's Date: 04/10/2014    History of Present Illness 63 yo male admitted due to sudden dizziness and weakness on L UE/ LE. Pt s/p fall with (A) from friends to floor with skin tear. pt with slurred speech. CT (+) R basal ganglia with internal capsula . Further workup pending. PMH: AFib,COPD, HTN, CAD, aortic stenosis s/p valve replacement, gout, PAD,    PT Comments    Pt demos improvement in ability to participate in sitting balance and is doing better at self-correcting balance.  Pt indicating discomfort in L knee with positional changes, but otherwise has no sensation in L LE.  Instructed pt to keep RN informed about knee pain as pt has a history of Gout.  Will continue to follow.    Follow Up Recommendations  CIR     Equipment Recommendations   (TBD)    Recommendations for Other Services       Precautions / Restrictions Precautions Precautions: Fall Restrictions Weight Bearing Restrictions: No    Mobility  Bed Mobility Overal bed mobility: Needs Assistance;+2 for physical assistance Bed Mobility: Rolling Rolling: Mod assist;+2 for physical assistance         General bed mobility comments: pt able to roll to L side with only ModA x1, but requires 2nd person for rolling to R side.    Transfers Overall transfer level: Needs assistance Equipment used: 2 person hand held assist Transfers: Sit to/from Stand Sit to Stand: Max assist;+2 physical assistance         General transfer comment: Utilized [place] for OOB to recliner and trialed standing from recliner.  pt having difficulty coming to full standing and remains very flexed today.  pt doing better at attending to balance and attempts to correct L lateral lean.    Ambulation/Gait                 Stairs            Wheelchair Mobility    Modified Rankin (Stroke Patients Only) Modified Rankin (Stroke  Patients Only) Pre-Morbid Rankin Score: Slight disability Modified Rankin: Severe disability     Balance Overall balance assessment: Needs assistance Sitting-balance support: Single extremity supported;Feet supported Sitting balance-Leahy Scale: Fair Sitting balance - Comments: pt is able to utilize R UE for reaching tasks and can maintain balance for brief periods of time.  pt able to work on lateral weight shifting in sitting with propping on R elbow and L elbow.  pt demos improvement in pt's ability to maintain balance and move trunk.   Postural control: Left lateral lean Standing balance support: During functional activity Standing balance-Leahy Scale: Zero                      Cognition Arousal/Alertness: Awake/alert Behavior During Therapy: WFL for tasks assessed/performed Overall Cognitive Status: Within Functional Limits for tasks assessed                      Exercises      General Comments        Pertinent Vitals/Pain Pain Assessment: Faces Pain Score: 3  Faces Pain Scale: Hurts little more Pain Location: L knee. Pain Descriptors / Indicators: Aching Pain Intervention(s): Monitored during session;Repositioned    Home Living                      Prior Function  PT Goals (current goals can now be found in the care plan section) Acute Rehab PT Goals Patient Stated Goal: Back home PT Goal Formulation: With patient Time For Goal Achievement: 04/22/14 Potential to Achieve Goals: Good Progress towards PT goals: Progressing toward goals    Frequency  Min 4X/week    PT Plan Current plan remains appropriate    Co-evaluation             End of Session Equipment Utilized During Treatment: Gait belt Activity Tolerance: Patient tolerated treatment well Patient left: in chair;with call bell/phone within reach     Time: 0911-0940 PT Time Calculation (min) (ACUTE ONLY): 29 min  Charges:  $Therapeutic Activity: 23-37  mins                    G CodesCatarina Hartshorn, [place] 04/10/2014, 10:21 AM

## 2014-04-10 NOTE — Progress Notes (Signed)
Speech Language Pathology Treatment: Dysphagia  Patient Details Name: [name] MRN: [id number] DOB: 01/04/1951 Today's Date: 04/10/2014 Time: 3559-7416 SLP Time Calculation (min) (ACUTE ONLY): 11 min  Assessment / Plan / Recommendation Clinical Impression  Skilled dysphagia intervention with portion of lunch meal.  He recalled double swallow strategy independently and needed min-mild cues to demonstrate. Wet vocal quality and delayed throat clear x 1 significantly decreased now on nectar thick liquids. Pt tired of V-8 juice and Coke thickened to nectar. Mildly prolonged oral mastication.  Continue Dys 3 texture and nectar with hopeful upgrade next week to thin liquids.   HPI HPI: 63 y.o. male who has known Afib, COPD, aortic valve replacement, HTN, PAD admitted after sudden  dizziness and weakness on the left arm>leg. CT head showed a right IC basal ganglia hemorrhage. No CXR performed.     Pertinent Vitals Pain Assessment: No/denies pain  SLP Plan  Continue with current plan of care    Recommendations Diet recommendations: Dysphagia 3 (mechanical soft);Nectar-thick liquid Liquids provided via: Cup;No straw Medication Administration: Whole meds with puree Supervision: Patient able to self feed;Intermittent supervision to cue for compensatory strategies Compensations: Slow rate;Small sips/bites;Multiple dry swallows after each bite/sip;Follow solids with liquid Postural Changes and/or Swallow Maneuvers: Seated upright 90 degrees;Upright 30-60 min after meal              Oral Care Recommendations: Oral care BID Follow up Recommendations:  (TBD) Plan: Continue with current plan of care    GO     [name] 04/10/2014, 2:09 PM   [name].Ed [employer] [phone]

## 2014-04-10 NOTE — Progress Notes (Signed)
Pt received at 16:20Pm alert, verbal with no noted distress. Wife at bedside. Pt educated and orientated to room. HOB elevated on 2L of 02 via n/c.  O2 sat 88-90% without O2 c/o SOB. Safety measures in place. Call bell within reach. Will continue to monitor.

## 2014-04-10 NOTE — Progress Notes (Signed)
Rehab admissions - I am following pt's case today. We are still waiting for insurance approval for inpatient rehab. I called [employer] [place] and spoke with representative. Pt's case had not been assigned to a case manager as of 1330. I asked that his case be prioritized.   I met with pt this afternoon to share the update that we are waiting on response from [facility]. Pt will be transferring out of ICU shortly.   I will follow pt's progress over the weekend and meet with him on Monday with any updates. I will keep the pt/family and medical team aware of any updates as I wait to hear from [facility].  Please call me with any questions. Thanks.  Nanetta Batty, PT Rehabilitation Admissions Coordinator [phone]

## 2014-04-11 LAB — GLUCOSE, CAPILLARY
GLUCOSE-CAPILLARY: 103 mg/dL — AB (ref 70–99)
Glucose-Capillary: 114 mg/dL — ABNORMAL HIGH (ref 70–99)
Glucose-Capillary: 145 mg/dL — ABNORMAL HIGH (ref 70–99)
Glucose-Capillary: 145 mg/dL — ABNORMAL HIGH (ref 70–99)

## 2014-04-11 NOTE — Progress Notes (Signed)
Stroke Team Progress Note  HISTORY  [name] is an 63 y.o. male who has known Afib on Eliquis (last took pill at 0800 today). He was with his friends today at 1530 when he suddenly felt dizzy and then had weakness on the left arm>leg. He slumped to the left side and his friends helped ease him to the floor. He did sustain a skin tear on the left arm and left shin. EMS was called and due to noting left arm flaccidity and left facial droop --Code stroke was called. Initial CT head showed a right IC basal ganglia hemorrhage. BP 158/85. Order for [place] placed. Currently he is awake and alert in no distress. Complaining of left arm and leg weakness along with mild slurred speech.   Date last known well: Date: 04/07/2014 Time last known well: Time: 15:30 tPA Given: No: ICH Patient was not a TPA candidate secondary to [place]. He was admitted to the neuro ICU for further evaluation and treatment.  SUBJECTIVE Several friends at the bedside. The patient feels his left upper extremity is still very weak however strength testing revealed 4/5 strength.  OBJECTIVE Most recent Vital Signs: Filed Vitals:   04/10/14 2111 04/11/14 0227 04/11/14 0627 04/11/14 0919  BP: 118/73 110/71 135/62 110/70  Pulse: 118 108 116 111  Temp: 99.6 F (37.6 C) 98.4 F (36.9 C) 98 F (36.7 C) 98.2 F (36.8 C)  TempSrc: Oral Oral Oral Oral  Resp: 22 22 20 19   Height:      Weight:      SpO2: 92% 93% 99% 95%   CBG (last 3)   Recent Labs  04/10/14 1706 04/10/14 2158 04/11/14 0659  GLUCAP 132* 112* 114*    IV Fluid Intake:   . sodium chloride 50 mL/hr at 04/10/14 1647    MEDICATIONS  . antiseptic oral rinse  7 mL Mouth Rinse BID  . atorvastatin  80 mg Oral q1800  . budesonide-formoterol  2 puff Inhalation BID  . carvedilol  12.5 mg Oral BID WC  . colchicine  0.6 mg Oral BID  . digoxin  0.25 mg Oral QODAY  . metolazone  2.5 mg Oral Q M,W,F  . pantoprazole  40 mg Oral Daily  . polyethylene  glycol  17 g Oral Daily  . potassium chloride SA  20 mEq Oral Daily  . senna-docusate  1 tablet Oral BID  . spironolactone  25 mg Oral Daily  . tiZANidine  8 mg Oral BID  . torsemide  20 mg Oral q1800  . torsemide  40 mg Oral QAC breakfast   PRN:  acetaminophen **OR** acetaminophen, HYDROmorphone, labetalol, RESOURCE THICKENUP CLEAR  Diet:  DIET DYS 3 thin liquids Activity:  Up as tolerated DVT Prophylaxis:  SCD  CLINICALLY SIGNIFICANT STUDIES Basic Metabolic Panel:   Recent Labs Lab 04/07/14 1607 04/07/14 1619 04/09/14 0210  NA 141 141 141  K 3.8 3.5* 3.9  CL 100 100 100  CO2 29  --  31  GLUCOSE 99 103* 128*  BUN 30* 30* 18  CREATININE 1.29 1.50* 1.35  CALCIUM 9.1  --  8.9   Liver Function Tests:   Recent Labs Lab 04/07/14 1607 04/09/14 0210  AST 33 84*  ALT 21 26  ALKPHOS 78 66  BILITOT 0.4 0.4  PROT 7.8 6.7  ALBUMIN 3.3* 2.7*   CBC:   Recent Labs Lab 04/07/14 1607 04/07/14 1619 04/09/14 0210  WBC 6.9  --  6.1  NEUTROABS 3.6  --  3.3  HGB 10.9* 12.6* 10.3*  HCT 35.7* 37.0* 34.2*  MCV 86.9  --  88.4  PLT 215  --  177   Coagulation:   Recent Labs Lab 04/07/14 1607  LABPROT 14.9  INR 1.15   Cardiac Enzymes: No results for input(s): CKTOTAL, CKMB, CKMBINDEX, TROPONINI in the last 168 hours. Urinalysis:   Recent Labs Lab 04/07/14 1803  COLORURINE YELLOW  LABSPEC 1.009  PHURINE 5.5  GLUCOSEU NEGATIVE  HGBUR TRACE*  BILIRUBINUR NEGATIVE  KETONESUR NEGATIVE  PROTEINUR 30*  UROBILINOGEN 0.2  NITRITE NEGATIVE  LEUKOCYTESUR NEGATIVE   Lipid Panel    Component Value Date/Time   CHOL 197 12/31/2013 1102   TRIG 90 12/31/2013 1102   HDL 73 12/31/2013 1102   CHOLHDL 2.7 12/31/2013 1102   VLDL 18 12/31/2013 1102   LDLCALC 106* 12/31/2013 1102   HgbA1C  Lab Results  Component Value Date   HGBA1C 5.2 10/07/2012    Urine Drug Screen:      Component Value Date/Time   LABOPIA NONE DETECTED 04/07/2014 1803   COCAINSCRNUR NONE DETECTED  04/07/2014 1803   LABBENZ NONE DETECTED 04/07/2014 1803   AMPHETMU NONE DETECTED 04/07/2014 1803   THCU NONE DETECTED 04/07/2014 1803   LABBARB NONE DETECTED 04/07/2014 1803    Alcohol Level:   Recent Labs Lab 04/07/14 1607  ETH <11    Ct Head Wo Contrast 04/10/2014    1. Stable intra-axial hemorrhage centered at the right thalamus with small volume intraventricular extension of blood.  2. Stable mild regional mass effect.  No ventriculomegaly.  3. No new intracranial abnormality.      CT of the brain 04/07/14:  IMPRESSION: 1. Acute posterior RIGHT basal ganglia and internal capsule hemorrhage measuring 15 mm x 23 mm. 2. 4 mm of RIGHT to LEFT midline shift. 3. Area of low attenuation in the posterior LEFT temporal lobe may represent subacute ischemia but the appearance is more suggestive of either an old infarct or asymmetric chronic ischemic white matter disease.   CT brain 04/08/14:  IMPRESSION: Stable exams inch yesterday. Right thalamic intraparenchymal hematoma again measured at 15 x 24 mm. Mild surrounding edema. Small amount of intraventricular hemorrhage, not increased and without hydrocephalus.    EKG  Ectopic atrial tachycardia, unifocal Nonspecific ST and T wave abnormality  Therapy Recommendations - CIR planned - waiting for insurance approval.  Physical Exam  General: The patient is alert and cooperative at the time of the examination. The patient is moderately obese.  Respiratory: Lung fields are clear  Abdomen: Obese, slightly tender with palpation  Cardiovascular: Occasional irregular heart rhythm, no obvious murmurs or rubs noted.  Skin: No significant peripheral edema is noted.   Neurologic Exam  Mental status: The patient is oriented x 3.  Cranial nerves: Facial symmetry is present. Speech is normal, no aphasia or dysarthria is noted. Extraocular movements are full. Visual fields are full. tongue protrudes in the midline.  Motor:  The patient has good strength the right  extremities. on the left side, the left arm is now 4 minus/5 strength, the patient is able to elevate the left arm above the head, but there is poor grip with the left hand. The left leg can be elevated against gravity, 4 minus/5 strength.  Sensory examination: the patient has profound decreased soft touch and pinprick sensation on the left face, arm, and leg, almost anesthetic.  Coordination: The patient has good finger-nose-finger and heel-to-shin on the right, the patient has significant ataxia with the left upper  extremity, has difficulty with the left leg.  Gait and station: gait was not tested.  Reflexes: Deep tendon reflexes are symmetric, with exception that the left biceps reflex was slightly elevated. .    ASSESSMENT [name] is a 63 y.o. male presenting with a right basal ganglia her intercurrent hemorrhage, patient is on and coagulation with Eliquis. The anticoagulation was reversed, the patient was admitted to the neuroscience intensive care unit for further evaluation. The patient is alert and cooperative at this time, but he has a significant left hemiparesis particular affecting the left arm.    Right basilar ganglia intracranial hemorrhage  Hypertension  Severe aortic stenosis, status post tissue valve replacement  Atrial fibrillation on Eliquis prior to admission  Dyslipidemia  COPD  Carotid artery disease  Coronary artery disease  Hospital day # 4   A carotid Doppler study was recently done on 02/25/2014, reveals 70% stenosis of the left internal carotid artery, 50-69% stenosis on the right internal carotid artery. (Could consider CT angiogram of head and neck to further evaluate carotids and possible source for intracranial hemorrhage).    Mikey Bussing PA-C [facility] Pager [phone] 04/11/2014, 9:44 AM

## 2014-04-12 ENCOUNTER — Inpatient Hospital Stay (HOSPITAL_COMMUNITY): Payer: Self-pay | Admitting: *Deleted

## 2014-04-12 LAB — GLUCOSE, CAPILLARY
GLUCOSE-CAPILLARY: 136 mg/dL — AB (ref 70–99)
GLUCOSE-CAPILLARY: 140 mg/dL — AB (ref 70–99)
Glucose-Capillary: 133 mg/dL — ABNORMAL HIGH (ref 70–99)
Glucose-Capillary: 109 mg/dL — ABNORMAL HIGH (ref 70–99)

## 2014-04-12 NOTE — Progress Notes (Signed)
Stroke Team Progress Note  HISTORY  [name] is an 63 y.o. male who has known Afib on Eliquis (last took pill at 0800 today). He was with his friends today at 1530 when he suddenly felt dizzy and then had weakness on the left arm>leg. He slumped to the left side and his friends helped ease him to the floor. He did sustain a skin tear on the left arm and left shin. EMS was called and due to noting left arm flaccidity and left facial droop --Code stroke was called. Initial CT head showed a right IC basal ganglia hemorrhage. BP 158/85. Order for [place] placed. Currently he is awake and alert in no distress. Complaining of left arm and leg weakness along with mild slurred speech.   Date last known well: Date: 04/07/2014 Time last known well: Time: 15:30 tPA Given: No: ICH Patient was not a TPA candidate secondary to [place]. He was admitted to the neuro ICU for further evaluation and treatment.  SUBJECTIVE The patient's son is at the bedside. Multiple questions were answered. The patient voices no complaints.  OBJECTIVE Most recent Vital Signs: Filed Vitals:   04/11/14 1809 04/11/14 2059 04/12/14 0311 04/12/14 0610  BP: 108/58 117/67 103/68 117/70  Pulse: 105 110 56 116  Temp: 98.8 F (37.1 C) 99.3 F (37.4 C) 98.2 F (36.8 C) 98.2 F (36.8 C)  TempSrc: Oral Oral Oral Oral  Resp: 19 20 20 24   Height:      Weight:      SpO2: 98% 98% 95% 94%   CBG (last 3)   Recent Labs  04/11/14 1656 04/11/14 2218 04/12/14 0705  GLUCAP 145* 103* 133*    IV Fluid Intake:   . sodium chloride 50 mL/hr at 04/10/14 1647    MEDICATIONS  . antiseptic oral rinse  7 mL Mouth Rinse BID  . atorvastatin  80 mg Oral q1800  . budesonide-formoterol  2 puff Inhalation BID  . carvedilol  12.5 mg Oral BID WC  . colchicine  0.6 mg Oral BID  . digoxin  0.25 mg Oral QODAY  . metolazone  2.5 mg Oral Q M,W,F  . pantoprazole  40 mg Oral Daily  . polyethylene glycol  17 g Oral Daily  . potassium  chloride SA  20 mEq Oral Daily  . senna-docusate  1 tablet Oral BID  . spironolactone  25 mg Oral Daily  . tiZANidine  8 mg Oral BID  . torsemide  20 mg Oral q1800  . torsemide  40 mg Oral QAC breakfast   PRN:  acetaminophen **OR** acetaminophen, HYDROmorphone, labetalol, RESOURCE THICKENUP CLEAR  Diet:  DIET DYS 3 thin liquids Activity:  Up as tolerated DVT Prophylaxis:  SCD  CLINICALLY SIGNIFICANT STUDIES Basic Metabolic Panel:   Recent Labs Lab 04/07/14 1607 04/07/14 1619 04/09/14 0210  NA 141 141 141  K 3.8 3.5* 3.9  CL 100 100 100  CO2 29  --  31  GLUCOSE 99 103* 128*  BUN 30* 30* 18  CREATININE 1.29 1.50* 1.35  CALCIUM 9.1  --  8.9   Liver Function Tests:   Recent Labs Lab 04/07/14 1607 04/09/14 0210  AST 33 84*  ALT 21 26  ALKPHOS 78 66  BILITOT 0.4 0.4  PROT 7.8 6.7  ALBUMIN 3.3* 2.7*   CBC:   Recent Labs Lab 04/07/14 1607 04/07/14 1619 04/09/14 0210  WBC 6.9  --  6.1  NEUTROABS 3.6  --  3.3  HGB 10.9* 12.6* 10.3*  HCT 35.7* 37.0* 34.2*  MCV 86.9  --  88.4  PLT 215  --  177   Coagulation:   Recent Labs Lab 04/07/14 1607  LABPROT 14.9  INR 1.15   Cardiac Enzymes: No results for input(s): CKTOTAL, CKMB, CKMBINDEX, TROPONINI in the last 168 hours. Urinalysis:   Recent Labs Lab 04/07/14 1803  COLORURINE YELLOW  LABSPEC 1.009  PHURINE 5.5  GLUCOSEU NEGATIVE  HGBUR TRACE*  BILIRUBINUR NEGATIVE  KETONESUR NEGATIVE  PROTEINUR 30*  UROBILINOGEN 0.2  NITRITE NEGATIVE  LEUKOCYTESUR NEGATIVE   Lipid Panel    Component Value Date/Time   CHOL 197 12/31/2013 1102   TRIG 90 12/31/2013 1102   HDL 73 12/31/2013 1102   CHOLHDL 2.7 12/31/2013 1102   VLDL 18 12/31/2013 1102   LDLCALC 106* 12/31/2013 1102   HgbA1C  Lab Results  Component Value Date   HGBA1C 5.2 10/07/2012    Urine Drug Screen:      Component Value Date/Time   LABOPIA NONE DETECTED 04/07/2014 1803   COCAINSCRNUR NONE DETECTED 04/07/2014 1803   LABBENZ NONE  DETECTED 04/07/2014 1803   AMPHETMU NONE DETECTED 04/07/2014 1803   THCU NONE DETECTED 04/07/2014 1803   LABBARB NONE DETECTED 04/07/2014 1803    Alcohol Level:   Recent Labs Lab 04/07/14 1607  ETH <11    Ct Head Wo Contrast 04/10/2014    1. Stable intra-axial hemorrhage centered at the right thalamus with small volume intraventricular extension of blood.  2. Stable mild regional mass effect.  No ventriculomegaly.  3. No new intracranial abnormality.      CT of the brain  04/07/14:  IMPRESSION: 1. Acute posterior RIGHT basal ganglia and internal capsule hemorrhage measuring 15 mm x 23 mm. 2. 4 mm of RIGHT to LEFT midline shift. 3. Area of low attenuation in the posterior LEFT temporal lobe may represent subacute ischemia but the appearance is more suggestive of either an old infarct or asymmetric chronic ischemic white matter disease.   CT brain 04/08/14:  IMPRESSION: Stable exams inch yesterday. Right thalamic intraparenchymal hematoma again measured at 15 x 24 mm. Mild surrounding edema. Small amount of intraventricular hemorrhage, not increased and without hydrocephalus.    EKG  Ectopic atrial tachycardia, unifocal Nonspecific ST and T wave abnormality  Therapy Recommendations - CIR planned - awaiting insurance approval.  Physical Exam  General: The patient is alert and cooperative at the time of the examination. The patient is moderately obese.  Respiratory: Lung fields are clear  Abdomen: Obese, slightly tender with palpation  Cardiovascular: Occasional irregular heart rhythm, no obvious murmurs or rubs noted.  Skin: No significant peripheral edema is noted.   Neurologic Exam  Mental status: The patient is oriented x 3.  Cranial nerves: Facial symmetry is present. Speech is normal, no aphasia or dysarthria is noted. Extraocular movements are full. Visual fields are full. tongue protrudes in the midline. Decreased sensation left side of  face.  Motor: The patient has good strength the right  extremities. on the left side, the left arm is now 4 minus/5 strength, the patient is able to elevate the left arm above the head, but there is poor grip with the left hand. The left leg can be elevated against gravity, 4/5 strength.  Sensory examination: the patient has profound decreased soft touch and pinprick sensation on the left face, arm, and leg, almost anesthetic.  Coordination: The patient has good finger-nose-finger and heel-to-shin on the right, the patient has significant ataxia with the left upper  extremity, has difficulty with the left leg.  Gait and station: gait was not tested.      ASSESSMENT [name] is a 63 y.o. male presenting with a right basal ganglia her intercurrent hemorrhage, patient was on anticoagulation with Eliquis. The anticoagulation was reversed, the patient was admitted to the neuroscience intensive care unit for further evaluation. The patient is alert and cooperative at this time, but he has a significant left hemiparesis particular affecting the left arm.    Right basilar ganglia intracranial hemorrhage  Hypertension  Severe aortic stenosis, status post tissue valve replacement  Atrial fibrillation on Eliquis prior to admission  Dyslipidemia  COPD  Carotid artery disease  Coronary artery disease  Hospital day # 5   A carotid Doppler study was recently done on 02/25/2014, reveals 70% stenosis of the left internal carotid artery, 50-69% stenosis on the right internal carotid artery. (Could consider CT angiogram of head and neck to further evaluate carotids and possible source for intracranial hemorrhage). Discussed with Dr. Irish Elders - repeat carotid ultrasound.    Mikey Bussing PA-C [facility] Pager [phone] 04/12/2014, 9:43 AM  Carotid US as above. CIR placement. Leotis Pain

## 2014-04-13 ENCOUNTER — Inpatient Hospital Stay (HOSPITAL_COMMUNITY): Payer: BLUE CROSS/BLUE SHIELD

## 2014-04-13 ENCOUNTER — Encounter (HOSPITAL_COMMUNITY): Admission: EM | Disposition: A | Discharge status: Rehabilitation Facility | Payer: Self-pay | Attending: Neurology

## 2014-04-13 ENCOUNTER — Inpatient Hospital Stay (HOSPITAL_COMMUNITY)
Admission: RE | Admit: 2014-04-13 | Discharge: 2014-05-05 | DRG: 057 | Disposition: A | Discharge status: Skilled Nursing Facility | Payer: BLUE CROSS/BLUE SHIELD | Source: Intra-hospital | Attending: Physical Medicine & Rehabilitation | Admitting: Physical Medicine & Rehabilitation

## 2014-04-13 DIAGNOSIS — I69154 Hemiplegia and hemiparesis following nontraumatic intracerebral hemorrhage affecting left non-dominant side: Principal | ICD-10-CM

## 2014-04-13 DIAGNOSIS — I69191 Dysphagia following nontraumatic intracerebral hemorrhage: Secondary | ICD-10-CM | POA: Diagnosis not present

## 2014-04-13 DIAGNOSIS — F4321 Adjustment disorder with depressed mood: Secondary | ICD-10-CM | POA: Diagnosis present

## 2014-04-13 DIAGNOSIS — I739 Peripheral vascular disease, unspecified: Secondary | ICD-10-CM | POA: Diagnosis present

## 2014-04-13 DIAGNOSIS — G8194 Hemiplegia, unspecified affecting left nondominant side: Secondary | ICD-10-CM

## 2014-04-13 DIAGNOSIS — E871 Hypo-osmolality and hyponatremia: Secondary | ICD-10-CM | POA: Diagnosis not present

## 2014-04-13 DIAGNOSIS — R208 Other disturbances of skin sensation: Secondary | ICD-10-CM | POA: Diagnosis not present

## 2014-04-13 DIAGNOSIS — J449 Chronic obstructive pulmonary disease, unspecified: Secondary | ICD-10-CM | POA: Diagnosis present

## 2014-04-13 DIAGNOSIS — G4701 Insomnia due to medical condition: Secondary | ICD-10-CM | POA: Diagnosis present

## 2014-04-13 DIAGNOSIS — I6523 Occlusion and stenosis of bilateral carotid arteries: Secondary | ICD-10-CM | POA: Diagnosis present

## 2014-04-13 DIAGNOSIS — J439 Emphysema, unspecified: Secondary | ICD-10-CM | POA: Diagnosis not present

## 2014-04-13 DIAGNOSIS — I2510 Atherosclerotic heart disease of native coronary artery without angina pectoris: Secondary | ICD-10-CM | POA: Diagnosis present

## 2014-04-13 DIAGNOSIS — I480 Paroxysmal atrial fibrillation: Secondary | ICD-10-CM | POA: Diagnosis present

## 2014-04-13 DIAGNOSIS — R7989 Other specified abnormal findings of blood chemistry: Secondary | ICD-10-CM | POA: Diagnosis not present

## 2014-04-13 DIAGNOSIS — I5022 Chronic systolic (congestive) heart failure: Secondary | ICD-10-CM | POA: Diagnosis present

## 2014-04-13 DIAGNOSIS — Z952 Presence of prosthetic heart valve: Secondary | ICD-10-CM | POA: Diagnosis not present

## 2014-04-13 DIAGNOSIS — I10 Essential (primary) hypertension: Secondary | ICD-10-CM | POA: Diagnosis present

## 2014-04-13 DIAGNOSIS — E785 Hyperlipidemia, unspecified: Secondary | ICD-10-CM | POA: Diagnosis present

## 2014-04-13 DIAGNOSIS — R1310 Dysphagia, unspecified: Secondary | ICD-10-CM | POA: Diagnosis present

## 2014-04-13 DIAGNOSIS — G8929 Other chronic pain: Secondary | ICD-10-CM | POA: Diagnosis present

## 2014-04-13 DIAGNOSIS — I69398 Other sequelae of cerebral infarction: Secondary | ICD-10-CM | POA: Diagnosis not present

## 2014-04-13 DIAGNOSIS — R600 Localized edema: Secondary | ICD-10-CM | POA: Diagnosis not present

## 2014-04-13 DIAGNOSIS — M10061 Idiopathic gout, right knee: Secondary | ICD-10-CM | POA: Diagnosis present

## 2014-04-13 DIAGNOSIS — I2781 Cor pulmonale (chronic): Secondary | ICD-10-CM | POA: Diagnosis present

## 2014-04-13 DIAGNOSIS — Z9981 Dependence on supplemental oxygen: Secondary | ICD-10-CM

## 2014-04-13 DIAGNOSIS — K5900 Constipation, unspecified: Secondary | ICD-10-CM | POA: Diagnosis present

## 2014-04-13 DIAGNOSIS — I5042 Chronic combined systolic (congestive) and diastolic (congestive) heart failure: Secondary | ICD-10-CM | POA: Diagnosis present

## 2014-04-13 DIAGNOSIS — H9190 Unspecified hearing loss, unspecified ear: Secondary | ICD-10-CM | POA: Diagnosis present

## 2014-04-13 DIAGNOSIS — Z87891 Personal history of nicotine dependence: Secondary | ICD-10-CM

## 2014-04-13 DIAGNOSIS — I69122 Dysarthria following nontraumatic intracerebral hemorrhage: Secondary | ICD-10-CM

## 2014-04-13 DIAGNOSIS — N179 Acute kidney failure, unspecified: Secondary | ICD-10-CM | POA: Diagnosis not present

## 2014-04-13 DIAGNOSIS — I619 Nontraumatic intracerebral hemorrhage, unspecified: Secondary | ICD-10-CM | POA: Diagnosis present

## 2014-04-13 DIAGNOSIS — Z951 Presence of aortocoronary bypass graft: Secondary | ICD-10-CM

## 2014-04-13 DIAGNOSIS — R269 Unspecified abnormalities of gait and mobility: Secondary | ICD-10-CM

## 2014-04-13 DIAGNOSIS — E876 Hypokalemia: Secondary | ICD-10-CM

## 2014-04-13 DIAGNOSIS — Z09 Encounter for follow-up examination after completed treatment for conditions other than malignant neoplasm: Secondary | ICD-10-CM

## 2014-04-13 DIAGNOSIS — I618 Other nontraumatic intracerebral hemorrhage: Secondary | ICD-10-CM | POA: Diagnosis not present

## 2014-04-13 DIAGNOSIS — I639 Cerebral infarction, unspecified: Secondary | ICD-10-CM

## 2014-04-13 DIAGNOSIS — IMO0002 Reserved for concepts with insufficient information to code with codable children: Secondary | ICD-10-CM

## 2014-04-13 DIAGNOSIS — R52 Pain, unspecified: Secondary | ICD-10-CM

## 2014-04-13 DIAGNOSIS — G8190 Hemiplegia, unspecified affecting unspecified side: Secondary | ICD-10-CM | POA: Diagnosis not present

## 2014-04-13 LAB — GLUCOSE, CAPILLARY
GLUCOSE-CAPILLARY: 125 mg/dL — AB (ref 70–99)
Glucose-Capillary: 118 mg/dL — ABNORMAL HIGH (ref 70–99)

## 2014-04-13 SURGERY — LEFT HEART CATHETERIZATION WITH CORONARY ANGIOGRAM
Anesthesia: LOCAL

## 2014-04-13 MED ORDER — BISACODYL 10 MG RE SUPP
10.0000 mg | Freq: Every day | RECTAL | Status: DC | PRN
  Administered 2014-04-13 – 2014-04-27 (×2): 10 mg via RECTAL
  Filled 2014-04-13 (×2): qty 1

## 2014-04-13 MED ORDER — METOLAZONE 2.5 MG PO TABS
2.5000 mg | ORAL_TABLET | ORAL | Status: DC
  Administered 2014-04-15 – 2014-04-20 (×3): 2.5 mg via ORAL
  Filled 2014-04-13 (×3): qty 1

## 2014-04-13 MED ORDER — CARVEDILOL 12.5 MG PO TABS
12.5000 mg | ORAL_TABLET | Freq: Two times a day (BID) | ORAL | Status: DC
  Administered 2014-04-13 – 2014-05-05 (×43): 12.5 mg via ORAL
  Filled 2014-04-13 (×48): qty 1

## 2014-04-13 MED ORDER — TRAZODONE HCL 50 MG PO TABS
25.0000 mg | ORAL_TABLET | Freq: Every evening | ORAL | Status: DC | PRN
  Administered 2014-04-18: 25 mg via ORAL
  Administered 2014-04-19 – 2014-04-22 (×2): 50 mg via ORAL
  Filled 2014-04-13 (×4): qty 1

## 2014-04-13 MED ORDER — PROCHLORPERAZINE MALEATE 5 MG PO TABS
5.0000 mg | ORAL_TABLET | Freq: Four times a day (QID) | ORAL | Status: DC | PRN
  Filled 2014-04-13: qty 2

## 2014-04-13 MED ORDER — POTASSIUM CHLORIDE CRYS ER 20 MEQ PO TBCR
20.0000 meq | EXTENDED_RELEASE_TABLET | Freq: Every day | ORAL | Status: DC
  Administered 2014-04-14 – 2014-04-21 (×8): 20 meq via ORAL
  Filled 2014-04-13 (×12): qty 1

## 2014-04-13 MED ORDER — PROCHLORPERAZINE EDISYLATE 5 MG/ML IJ SOLN
5.0000 mg | Freq: Four times a day (QID) | INTRAMUSCULAR | Status: DC | PRN
  Filled 2014-04-13: qty 2

## 2014-04-13 MED ORDER — SODIUM CHLORIDE 0.9 % IV SOLN
INTRAVENOUS | Status: DC
  Filled 2014-04-13: qty 1000

## 2014-04-13 MED ORDER — DICLOFENAC SODIUM 1 % TD GEL
2.0000 g | Freq: Four times a day (QID) | TRANSDERMAL | Status: DC
  Administered 2014-04-13 – 2014-05-05 (×83): 2 g via TOPICAL
  Filled 2014-04-13 (×2): qty 100

## 2014-04-13 MED ORDER — ACETAMINOPHEN 325 MG PO TABS
325.0000 mg | ORAL_TABLET | ORAL | Status: DC | PRN
  Administered 2014-04-19 – 2014-05-04 (×15): 650 mg via ORAL
  Filled 2014-04-13 (×15): qty 2

## 2014-04-13 MED ORDER — PROCHLORPERAZINE 25 MG RE SUPP
12.5000 mg | Freq: Four times a day (QID) | RECTAL | Status: DC | PRN
  Filled 2014-04-13: qty 1

## 2014-04-13 MED ORDER — FLEET ENEMA 7-19 GM/118ML RE ENEM: 1.0000 | ENEMA | Freq: Once | RECTAL | Status: AC | PRN

## 2014-04-13 MED ORDER — DIGOXIN 250 MCG PO TABS
0.2500 mg | ORAL_TABLET | ORAL | Status: DC
  Administered 2014-04-15 – 2014-04-21 (×4): 0.25 mg via ORAL
  Filled 2014-04-13 (×5): qty 1

## 2014-04-13 MED ORDER — TIZANIDINE HCL 4 MG PO TABS
8.0000 mg | ORAL_TABLET | Freq: Two times a day (BID) | ORAL | Status: DC
  Administered 2014-04-13 – 2014-04-20 (×14): 8 mg via ORAL
  Filled 2014-04-13 (×16): qty 2

## 2014-04-13 MED ORDER — CETYLPYRIDINIUM CHLORIDE 0.05 % MT LIQD
7.0000 mL | Freq: Two times a day (BID) | OROMUCOSAL | Status: DC
  Administered 2014-04-14 – 2014-05-05 (×41): 7 mL via OROMUCOSAL

## 2014-04-13 MED ORDER — DIPHENHYDRAMINE HCL 12.5 MG/5ML PO ELIX: 12.5000 mg | ORAL_SOLUTION | Freq: Four times a day (QID) | ORAL | Status: DC | PRN

## 2014-04-13 MED ORDER — PANTOPRAZOLE SODIUM 40 MG PO TBEC
40.0000 mg | DELAYED_RELEASE_TABLET | Freq: Every day | ORAL | Status: DC
Start: 2014-04-13 — End: 2014-05-05
  Administered 2014-04-13 – 2014-05-04 (×22): 40 mg via ORAL
  Filled 2014-04-13 (×29): qty 1

## 2014-04-13 MED ORDER — BUDESONIDE-FORMOTEROL FUMARATE 80-4.5 MCG/ACT IN AERO
2.0000 | INHALATION_SPRAY | Freq: Two times a day (BID) | RESPIRATORY_TRACT | Status: DC
  Administered 2014-04-13 – 2014-05-05 (×40): 2 via RESPIRATORY_TRACT
  Filled 2014-04-13 (×3): qty 6.9

## 2014-04-13 MED ORDER — ATORVASTATIN CALCIUM 80 MG PO TABS
80.0000 mg | ORAL_TABLET | Freq: Every day | ORAL | Status: DC
  Administered 2014-04-13 – 2014-05-04 (×22): 80 mg via ORAL
  Filled 2014-04-13 (×24): qty 1

## 2014-04-13 MED ORDER — RESOURCE THICKENUP CLEAR PO POWD
ORAL | Status: DC | PRN
  Filled 2014-04-13: qty 125

## 2014-04-13 MED ORDER — SENNOSIDES-DOCUSATE SODIUM 8.6-50 MG PO TABS
2.0000 | ORAL_TABLET | Freq: Two times a day (BID) | ORAL | Status: DC
  Administered 2014-04-13 – 2014-04-18 (×8): 2 via ORAL
  Filled 2014-04-13 (×16): qty 2

## 2014-04-13 MED ORDER — ALUM & MAG HYDROXIDE-SIMETH 200-200-20 MG/5ML PO SUSP: 30.0000 mL | ORAL | Status: DC | PRN

## 2014-04-13 MED ORDER — SPIRONOLACTONE 25 MG PO TABS
25.0000 mg | ORAL_TABLET | Freq: Every day | ORAL | Status: DC
  Administered 2014-04-14 – 2014-04-21 (×7): 25 mg via ORAL
  Filled 2014-04-13 (×9): qty 1

## 2014-04-13 MED ORDER — COLCHICINE 0.6 MG PO TABS
0.6000 mg | ORAL_TABLET | Freq: Two times a day (BID) | ORAL | Status: DC
  Administered 2014-04-13 – 2014-04-18 (×10): 0.6 mg via ORAL
  Filled 2014-04-13 (×16): qty 1

## 2014-04-13 MED ORDER — CETYLPYRIDINIUM CHLORIDE 0.05 % MT LIQD
7.0000 mL | Freq: Two times a day (BID) | OROMUCOSAL | Status: DC
  Administered 2014-04-13 – 2014-04-15 (×2): 7 mL via OROMUCOSAL

## 2014-04-13 MED ORDER — TORSEMIDE 20 MG PO TABS
40.0000 mg | ORAL_TABLET | Freq: Every day | ORAL | Status: DC
  Administered 2014-04-14 – 2014-04-21 (×8): 40 mg via ORAL
  Filled 2014-04-13 (×9): qty 2

## 2014-04-13 MED ORDER — TORSEMIDE 20 MG PO TABS
20.0000 mg | ORAL_TABLET | Freq: Every day | ORAL | Status: DC
  Administered 2014-04-13 – 2014-04-20 (×8): 20 mg via ORAL
  Filled 2014-04-13 (×9): qty 1

## 2014-04-13 MED ORDER — GUAIFENESIN-DM 100-10 MG/5ML PO SYRP: 5.0000 mL | ORAL_SOLUTION | Freq: Four times a day (QID) | ORAL | Status: DC | PRN

## 2014-04-13 NOTE — Progress Notes (Signed)
Speech Language Pathology Treatment: Dysphagia;Cognitive-Linquistic  Patient Details Name: [name] MRN: [id number] DOB: November 15, 1950 Today's Date: 04/13/2014 Time: 0165-5374 SLP Time Calculation (min) (ACUTE ONLY): 18 min  Assessment / Plan / Recommendation Clinical Impression  Pt seen for skilled observation of nectar-thickened liquids vs thin liquid consumption with swallowing strategies utilized independently and/or modified independent with small sips, alternating liquids and solids and double swallow when consuming diet (pt able to state precautions); pt only observed with liquids this session d/t desire for upgrade from nectar-thickened liquids and completion of cognitive treatment in conjunction with swallowing treatment with only hoarse vocal quality only alerting s/s of potential aspiration, but wet vocal quality and coughing seen in previous sessions not observed this session; pt able to state complex problem solving strategies related to safety upon discharge with 70% accuracy independently and pt able to state swallowing strategies during memory task independently.  MBS recommended prior to upgrade to r/o silent aspiration d/t hoarse vocal quality noted throughout session and hx of COPD, but pt may consume thin liquids with SLP present prior to MBS to assess s/s of aspiration.   HPI HPI: 63 y.o. male who has known Afib, COPD, aortic valve replacement, HTN, PAD admitted after sudden  dizziness and weakness on the left arm>leg. CT head showed a right IC basal ganglia hemorrhage. No CXR performed.     Pertinent Vitals Pain Assessment: No/denies pain Pain Score: 5  Pain Location: R knee Pain Descriptors / Indicators: Sore Pain Intervention(s): Monitored during session;Repositioned  SLP Plan  Continue with current plan of care    Recommendations Diet recommendations: Dysphagia 3 (mechanical soft);Nectar-thick liquid;Other(comment) (thin with SLP only prior to MBS) Liquids provided  via: Cup;No straw Medication Administration: Whole meds with puree Supervision: Patient able to self feed;Intermittent supervision to cue for compensatory strategies Compensations: Slow rate;Small sips/bites;Multiple dry swallows after each bite/sip;Follow solids with liquid Postural Changes and/or Swallow Maneuvers: Seated upright 90 degrees;Upright 30-60 min after meal              Oral Care Recommendations: Oral care BID Follow up Recommendations: Inpatient Rehab Plan: Continue with current plan of care         ADAMS,PAT, M.S., CCC-SLP 04/13/2014, 1:20 PM

## 2014-04-13 NOTE — H&P (Signed)
Physical Medicine and Rehabilitation Admission H&P   Chief Complaint  Patient presents with  . Left sided weakness, dysphagia,mild dysarthria    HPI: [name] is a 63 y.o. right handed male history of HTN, COPD-oxygen dependent, A fib on Eliquis, s/p MAZE June 2014. He was independent prior to admission, lives alone and was using a walker due to back pain. He was admitted on 04/07/2014 past with left-sided weakness and mild slurred speech. CCT showed a right IC basal ganglia hemorrhage with a 4 mm right to left midline shift and BP 158/85 at admission. Eliquis was reversed with Kcentra and BP monitored closely. Recommendations to repeat CCT in 3-4 weeks to help determine ability to resume Eliquis. Repeat carotid dopplers done revealing 60- 79% L-ICA and 40- 59% R-ICA stenosis.Swallow evaluation done revealing dysphagia and patient placed on a mechanical soft nectar thick liquid diet. Patient has been reporting increase in right knee pain for the past few days with question of gout flare. Therapy ongoing and CIR recommended by rehab team and MD. Patient with resultant left sided weakness with dysphagia and admitted to rehab today.    Review of Systems  HENT: Positive for hearing loss.  Eyes: Negative for double vision.  Respiratory: Negative for cough.  Cardiovascular: Negative for chest pain and palpitations.  Gastrointestinal: Positive for constipation (No BM for a week). Negative for heartburn, nausea and abdominal pain.  Genitourinary: Negative for urgency and frequency.  Musculoskeletal: Positive for myalgias and joint pain (Acute on chronic right knee pain.).  Neurological: Positive for sensory change, speech change and focal weakness. Negative for dizziness and headaches.  Endo/Heme/Allergies: Bruises/bleeds easily.  Psychiatric/Behavioral: Negative for depression.     Past Medical History  Diagnosis Date  . Hypertension   . Gout   . Aortic  stenosis     echo 06/19/08 with nomal LV function, moderate concentric LVH moderate aortic stenosis area 0.99 cm squared, peak gradient of 50 and mean of 31  . Hyperlipidemia   . Atrial fibrillation with RVR, was in atrial fib in 04/2011 09/16/2012    a) s/p MAZE (09/2012)   . Tobacco use 09/16/2012  . Heart murmur   . Chronic combined systolic and diastolic CHF (congestive heart failure) 09/16/2012    a) ECHO (08/2013) EF 50-55%, grade II DD b) TEE ECHO (09/2013): 40-45%, bioprosthesis present, mild MR  . Aortic stenosis, severe 09/23/2012  . CAD (coronary artery disease), single vessel disease 09/23/2012    a) CABG (SVG to PDA and PL) wtih AVR (09/2012)   . PAD (peripheral artery disease), decreased bil. ABIs 09/23/2012    a) ABIs (11/2013): Right mild arterial insufficiency, L normal; aroto-bifem bypass graft: not well visualized, bilateral SFAs = to greater than 50% in dimaeter reduction   . Gout flare. 09/29/12, Lt knee, improved with colchicine. 09/30/2012  . H/O aortic valve replacement   . AS (aortic stenosis) with AVR with pericardial tissue valve  09/30/2013  . Back pain, spinal stenosis 09/30/2013  . COPD (chronic obstructive pulmonary disease)   . Bilateral carotid artery disease     Past Surgical History  Procedure Laterality Date  . Iliac artery stent  10/20/08    stent to lt iliac  . Aorto bifem bypass  12/18/08    by Dr. Trula Slade  . Coronary artery bypass graft N/A 10/08/2012    Procedure: CORONARY ARTERY BYPASS GRAFTING (CABG); Surgeon: Grace Isaac, MD; Location: [place]; Service: Open Heart Surgery; Laterality: N/A; Coronary Artery bypass Graft times  two utilizing the left greater saphenous vein harvested endoscopically  . Aortic valve replacement N/A 10/08/2012    Procedure: AORTIC VALVE REPLACEMENT (AVR); Surgeon: Grace Isaac, MD; Location: [place]; Service: Open Heart Surgery;  Laterality: N/A;  . Maze N/A 10/08/2012    Procedure: MAZE; Surgeon: Grace Isaac, MD; Location: [place]; Service: Open Heart Surgery; Laterality: N/A;  . Intraoperative transesophageal echocardiogram N/A 10/08/2012    Procedure: INTRAOPERATIVE TRANSESOPHAGEAL ECHOCARDIOGRAM; Surgeon: Grace Isaac, MD; Location: [place]; Service: Open Heart Surgery; Laterality: N/A;  . Endovein harvest of greater saphenous vein Bilateral 10/08/2012    Procedure: ENDOVEIN HARVEST OF GREATER SAPHENOUS VEIN; Surgeon: Grace Isaac, MD; Location: [place]; Service: Open Heart Surgery; Laterality: Bilateral;  . US echocardiography  06/20/2011    mod concentric LVH,LA severely dilated,RA mildly dilated,mod. ca+ of the mitral apparatus,trace MR,mod. ca+ AOV w/stenosis.  Marland Kitchen Nm myocar perf wall motion  08/25/2008    normal  . Cardioversion N/A 02/25/2013    Procedure: CARDIOVERSION; Surgeon: Sanda Klein, MD; Location: [place]; Service: Cardiovascular; Laterality: N/A;  . Tee without cardioversion N/A 09/25/2013    Procedure: TRANSESOPHAGEAL ECHOCARDIOGRAM (TEE); Surgeon: Thayer Headings, MD; Location: [place]; Service: Cardiovascular; Laterality: N/A;  . Cardioversion N/A 09/25/2013    Procedure: CARDIOVERSION; Surgeon: Thayer Headings, MD; Location: [facility] ENDOSCOPY; Service: Cardiovascular; Laterality: N/A;  . Left and right heart catheterization with coronary angiogram N/A 09/20/2012    Procedure: LEFT AND RIGHT HEART CATHETERIZATION WITH CORONARY ANGIOGRAM; Surgeon: Lorretta Harp, MD; Location[facility] CATH LAB; Service: Cardiovascular; Laterality: N/A;    Family History  Problem Relation Age of Onset  . Heart attack Father     died at 67 with MI  . Heart disease Brother   . Heart attack Brother   . Heart attack Brother     Social History: Lives alone. Retired uniform truck Geophysicist/field seismologist. He  reports that he quit smoking about 18 months ago--was smoking up to 2 PPD.Marland Kitchen His smoking use included Cigarettes. He has a 43 pack-year smoking history. He has never used smokeless tobacco. He reports that he drinks about 25.2 - 50.4 oz of alcohol per week. He reports that he does not use illicit drugs.   Allergies: No Known Allergies    Medications Prior to Admission  Medication Sig Dispense Refill  . albuterol (PROVENTIL HFA;VENTOLIN HFA) 108 (90 BASE) MCG/ACT inhaler Inhale 2 puffs into the lungs every 6 (six) hours as needed for wheezing or shortness of breath. 1 Inhaler 2  . apixaban (ELIQUIS) 5 MG TABS tablet Take 1 tablet (5 mg total) by mouth 2 (two) times daily. 180 tablet 3  . atorvastatin (LIPITOR) 80 MG tablet Take 1 tablet (80 mg total) by mouth daily at 6 PM. 30 tablet 6  . budesonide-formoterol (SYMBICORT) 80-4.5 MCG/ACT inhaler Inhale 2 puffs into the lungs 2 (two) times daily. 1 Inhaler 12  . carvedilol (COREG) 12.5 MG tablet Take 2 tablets (25 mg total) by mouth 2 (two) times daily with a meal. (Patient taking differently: Take 12.5 mg by mouth 2 (two) times daily. ) 30 tablet 3  . digoxin (LANOXIN) 0.25 MG tablet Take 1 tablet (0.25 mg total) by mouth every other day. 30 tablet 6  . folic acid (FOLVITE) 1 MG tablet Take 1 tablet (1 mg total) by mouth daily. 30 tablet 6  . gabapentin (NEURONTIN) 300 MG capsule Take 300 mg by mouth 3 (three) times daily.    . metolazone (ZAROXOLYN) 2.5 MG tablet Take  2.5 mg by mouth every Monday, Wednesday, and Friday.     . OXYGEN-HELIUM IN Inhale 1-2 L into the lungs continuous. Is in the habit of not using his oxygen when he leaves the house. Uses 1L continuous normally Uses 2L for exertion    . potassium chloride SA (K-DUR,KLOR-CON) 20 MEQ tablet Take 1 tablet (20 mEq total) by mouth daily. 60 tablet 6  . spironolactone (ALDACTONE) 25 MG tablet Take 1 tablet (25 mg total) by  mouth daily. 30 tablet 3  . tiZANidine (ZANAFLEX) 4 MG tablet Take 8 mg by mouth 2 (two) times daily.    Marland Kitchen torsemide (DEMADEX) 20 MG tablet Take 40 mg (2 tabs) in the morning and 20 mg (1 tab) in the evening (Patient taking differently: Take 20-40 mg by mouth 2 (two) times daily. Take 40 mg (2 tabs) in the morning and 20 mg (1 tab) in the evening) 90 tablet 3    Home: Home Living Family/patient expects to be discharged to:: Inpatient rehab Living Arrangements: Alone Available Help at Discharge: Family, Available PRN/intermittently Type of Home: Apartment Lives With: Alone  Functional History: Prior Function Level of Independence: Independent with assistive device(s) Comments: pt used a RW 2/2 back pain  Functional Status:  Mobility: Bed Mobility Overal bed mobility: Needs Assistance Bed Mobility: Supine to Sit, Sit to Supine Supine to sit: Mod assist Sit to supine: Mod assist General bed mobility comments: Pt provided assist with moving LEs off EOB, and asisst to lift trunk. Inhibition to prevent over use and pushing with Rt. UE Transfers Overall transfer level: Needs assistance Equipment used: Charlaine Dalton) Transfers: Sit to/from Stand Sit to Stand: Max assist, +2 physical assistance Stand pivot transfers: Total assist, +2 physical assistance General transfer comment: Pt too fatigued to attempt transfer. He was able to scoot up EOB with mod A with faciliatation for trunk flexion and control and inhibition to prevent pushing/over use Lt. UE      ADL: ADL Overall ADL's : Needs assistance/impaired Eating/Feeding: Set up, Bed level, Sitting Grooming: Wash/dry hands, Wash/dry face, Oral care, Brushing hair, Min guard, Sitting Grooming Details (indicate cue type and reason): sitting EOB  Upper Body Bathing: Moderate assistance, Sitting Lower Body Bathing: Maximal assistance, Bed level Upper Body Dressing : Maximal assistance, Sitting Lower Body Dressing: Total  assistance, Bed level Toilet Transfer: Total assistance Toilet Transfer Details (indicate cue type and reason): did not attempt due to pt fatigued Toileting- Clothing Manipulation and Hygiene: Total assistance, Bed level General ADL Comments: Pt is very motivated. he was able to perofrm simple grooming tasks EOB with min guard assist and cues for trunk control and balance  Cognition: Cognition Overall Cognitive Status: Within Functional Limits for tasks assessed Arousal/Alertness: Awake/alert Orientation Level: Oriented X4 Attention: Sustained Sustained Attention: Appears intact Memory: Impaired Memory Impairment: Retrieval deficit, Decreased recall of new information Awareness: Appears intact Problem Solving: Appears intact Safety/Judgment: Appears intact (will further assess) Cognition Arousal/Alertness: Awake/alert Behavior During Therapy: WFL for tasks assessed/performed Overall Cognitive Status: Within Functional Limits for tasks assessed  Blood pressure 107/64, pulse 115, temperature 98.4 F (36.9 C), temperature source Oral, resp. rate 25, height '5\' 10"'  (1.778 m), weight 103.4 kg (227 lb 15.3 oz), SpO2 98 %. Physical Exam  Nursing note and vitals reviewed. Constitutional: He is oriented to person, place, and time. He appears well-developed and well-nourished.  HENT:  Head: Normocephalic and atraumatic.  Neck: Normal range of motion. Neck supple.  Cardiovascular: Normal rate and regular rhythm.  Respiratory: Effort  normal and breath sounds normal. No respiratory distress. He has no wheezes.  GI: Soft. Bowel sounds are normal. He exhibits no distension. There is no tenderness.  Musculoskeletal: He exhibits no edema or tenderness.  Neurological: He is alert and oriented to person, place, and time.  Mild dysphonia with occasional pursed lip breathing. Is able to follow commands without difficulty. Sensory deficits left face and left side. LUE: 2-/5. LLE: 2-/5 hip,knee,  ankle. Left facial droop and tongue deviation  Right side 5/5 in the deltoid, biceps, triceps, grip, hip flexor, knee extensor, ankle dorsiflexor and plantar flexor Skin: Skin is warm and dry.  Left shin with two large abraded area--ruptured blisters? Large scab under left knee.     Lab Results Last 48 Hours    Results for orders placed or performed during the hospital encounter of 04/07/14 (from the past 48 hour(s))  Glucose, capillary Status: Abnormal   Collection Time: 04/08/14 8:11 AM  Result Value Ref Range   Glucose-Capillary 136 (H) 70 - 99 mg/dL   Comment 1 Notify RN    Comment 2 Documented in Chart   Glucose, capillary Status: Abnormal   Collection Time: 04/08/14 11:52 AM  Result Value Ref Range   Glucose-Capillary 192 (H) 70 - 99 mg/dL   Comment 1 Notify RN    Comment 2 Documented in Chart   Glucose, capillary Status: Abnormal   Collection Time: 04/08/14 10:03 PM  Result Value Ref Range   Glucose-Capillary 120 (H) 70 - 99 mg/dL  CBC with Differential Status: Abnormal   Collection Time: 04/09/14 2:10 AM  Result Value Ref Range   WBC 6.1 4.0 - 10.5 K/uL   RBC 3.87 (L) 4.22 - 5.81 MIL/uL   Hemoglobin 10.3 (L) 13.0 - 17.0 g/dL    Comment: REPEATED TO VERIFY DELTA CHECK NOTED    HCT 34.2 (L) 39.0 - 52.0 %   MCV 88.4 78.0 - 100.0 fL   MCH 26.6 26.0 - 34.0 pg   MCHC 30.1 30.0 - 36.0 g/dL   RDW 17.0 (H) 11.5 - 15.5 %   Platelets 177 150 - 400 K/uL   Neutrophils Relative % 55 43 - 77 %   Neutro Abs 3.3 1.7 - 7.7 K/uL   Lymphocytes Relative 32 12 - 46 %   Lymphs Abs 1.9 0.7 - 4.0 K/uL   Monocytes Relative 11 3 - 12 %   Monocytes Absolute 0.7 0.1 - 1.0 K/uL   Eosinophils Relative 2 0 - 5 %   Eosinophils Absolute 0.1 0.0 - 0.7 K/uL   Basophils Relative 0 0 - 1 %   Basophils Absolute 0.0 0.0 - 0.1 K/uL  Comprehensive  metabolic panel Status: Abnormal   Collection Time: 04/09/14 2:10 AM  Result Value Ref Range   Sodium 141 137 - 147 mEq/L   Potassium 3.9 3.7 - 5.3 mEq/L   Chloride 100 96 - 112 mEq/L   CO2 31 19 - 32 mEq/L   Glucose, Bld 128 (H) 70 - 99 mg/dL   BUN 18 6 - 23 mg/dL    Comment: DELTA CHECK NOTED   Creatinine, Ser 1.35 0.50 - 1.35 mg/dL   Calcium 8.9 8.4 - 10.5 mg/dL   Total Protein 6.7 6.0 - 8.3 g/dL   Albumin 2.7 (L) 3.5 - 5.2 g/dL   AST 84 (H) 0 - 37 U/L   ALT 26 0 - 53 U/L   Alkaline Phosphatase 66 39 - 117 U/L   Total Bilirubin 0.4 0.3 - 1.2 mg/dL  GFR calc non Af Amer 54 (L) >90 mL/min   GFR calc Af Amer 63 (L) >90 mL/min    Comment: (NOTE) The eGFR has been calculated using the CKD EPI equation. This calculation has not been validated in all clinical situations. eGFR's persistently <90 mL/min signify possible Chronic Kidney Disease.    Anion gap 10 5 - 15  Glucose, capillary Status: Abnormal   Collection Time: 04/09/14 8:30 AM  Result Value Ref Range   Glucose-Capillary 126 (H) 70 - 99 mg/dL   Comment 1 Notify RN    Comment 2 Documented in Chart   Glucose, capillary Status: Abnormal   Collection Time: 04/09/14 12:05 PM  Result Value Ref Range   Glucose-Capillary 152 (H) 70 - 99 mg/dL   Comment 1 Notify RN    Comment 2 Documented in Chart   Glucose, capillary Status: None   Collection Time: 04/09/14 5:36 PM  Result Value Ref Range   Glucose-Capillary 90 70 - 99 mg/dL   Comment 1 Notify RN    Comment 2 Documented in Chart   Glucose, capillary Status: Abnormal   Collection Time: 04/10/14 12:59 AM  Result Value Ref Range   Glucose-Capillary 154 (H) 70 - 99 mg/dL      Imaging Results (Last 48 hours)    Ct Head Wo Contrast  04/08/2014 CLINICAL DATA: Followup intracranial hemorrhage. EXAM: CT  HEAD WITHOUT CONTRAST TECHNIQUE: Contiguous axial images were obtained from the base of the skull through the vertex without intravenous contrast. COMPARISON: 04/07/2014 FINDINGS: Intraparenchymal hemorrhage in the right thalamus again measures 15 x 24 mm. There is mild surrounding edema. Small amount of intraventricular penetration remains evident with blood in the right lateral ventricle. Right-to-left shift is unchanged measuring 4 mm. Old infarction at the left temporoparietal junction region appears the same. No extra-axial collection. No subarachnoid hemorrhage. No evidence of neoplastic mass lesion. The calvarium is unremarkable. Sinuses are clear. Atherosclerotic calcification affects the major vessels at the base of the brain. IMPRESSION: Stable exams inch yesterday. Right thalamic intraparenchymal hematoma again measured at 15 x 24 mm. Mild surrounding edema. Small amount of intraventricular hemorrhage, not increased and without hydrocephalus. Electronically Signed By: Nelson Chimes M.D. On: 04/08/2014 15:24        Medical Problem List and Plan: 1. Functional deficits secondary to right BG/IC hemorrhage with left hemiparesis and hemisensory loss 2. DVT Prophylaxis/Anticoagulation: Continue SCDs. Add TEDs. Monitor for any signs of DVT. Check follow up CT head early Jan.  3. Pain Management/chronic back pain: Tylenol as needed for pain. Add voltaren gel. Continue Zanaflex 8 mg twice a day 4. Dysphagia. Dysphagia 3 nectar liquids. Monitor for any signs of aspiration. Follow-up speech therapy 5. Neuropsych: This patient is capable of making decisions on her own behalf. 6. Skin/Wound Care: Routine skin checks 7. Fluids/Electrolytes/Nutrition: Strict I and O's. Follow-up chemistries 8. Hypertension. Monitor every 8 hours. Continue Coreg 12.5 mg twice a day, Aldactone 25 mg daily, Demadex as directed, Zaroxolyn 2.5 mg Monday Wednesday Friday. Monitor with increased mobility 9. Atrial  fibrillation/status post Maze procedure June 2014: Eliquis on hold secondary to hemorrhage. Continue Lanoxin 0.25 mg every other day. Monitor HR tid.  10. Hyperlipidemia. Lipitor 11. History of gouty arthritis: Continue Colchicine 0.6 mg twice a day--no signs of flare up pain likely due to compensation for left side. . 12. COPD: Respiratory status stable. Continue Symbicort twice daily. 13. H/o Peripheral edema with weeping: Controlled due to lack of activity. Continue diuretics and elevate when seated. Low  salt diet. Monitor with increase in mobility.  14. Constipation: Will increase senna and augment regimen as needed.      Post Admission Physician Evaluation: 1. Functional deficits secondary to Right BG/IC hemorrhage with left HP and hemisensory loss as well as dysphagia.  2. Patient is admitted to receive collaborative, interdisciplinary care between the physiatrist, rehab nursing staff, and therapy team. 3. Patient's level of medical complexity and substantial therapy needs in context of that medical necessity cannot be provided at a lesser intensity of care such as a SNF. 4. Patient has experienced substantial functional loss from his/her baseline which was documented above under the "Functional History" and "Functional Status" headings. Judging by the patient's diagnosis, physical exam, and functional history, the patient has potential for functional progress which will result in measurable gains while on inpatient rehab. These gains will be of substantial and practical use upon discharge in facilitating mobility and self-care at the household level. 5. Physiatrist will provide 24 hour management of medical needs as well as oversight of the therapy plan/treatment and provide guidance as appropriate regarding the interaction of the two. 6. 24 hour rehab nursing will assist with bladder management, bowel management, safety, skin/wound care, disease management, medication administration,  pain management and patient education and help integrate therapy concepts, techniques,education, etc. 7. PT will assess and treat for/with: pre gait, gait training, endurance , safety, equipment, neuromuscular re education. Goals are: Sup. 8. OT will assess and treat for/with: ADLs, Cognitive perceptual skills, Neuromuscular re education, safety, endurance, equipment. Goals are: Sup. Therapy may proceed with showering this patient. 9. SLP will assess and treat for/with: [place], swallowing. Goals are: Safe po intake, Mod I med management. 10. Case Management and Social Worker will assess and treat for psychological issues and discharge planning. 11. Team conference will be held weekly to assess progress toward goals and to determine barriers to discharge. 12. Patient will receive at least 3 hours of therapy per day at least 5 days per week. 13. ELOS: 14-18 days  14. Prognosis: good   Charlett Blake M.D. [facility] FAAPM&R (Sports Med, Neuromuscular Med) Diplomate Am Board of Electrodiagnostic Med

## 2014-04-13 NOTE — Discharge Summary (Signed)
Stroke Discharge Summary  Patient ID: [name]   MRN: [id number]      DOB: 12-Jun-1950  Date of Admission: 04/07/2014 Date of Discharge: 04/13/2014  Attending Physician:  Rosalin Hawking, MD, PhD  Consulting Physician(s):    Patient's PCP:  Merrilee Seashore, MD  Discharge Diagnoses: Active Problems:   ICH (intracerebral hemorrhage) BMI  Body mass index is 32.71 kg/(m^2).  Past Medical History  Diagnosis Date  . Hypertension   . Gout   . Aortic stenosis     echo 06/19/08 with nomal LV function, moderate concentric LVH moderate aortic stenosis area 0.99 cm squared, peak gradient of 50 and mean of 31  . Hyperlipidemia   . Atrial fibrillation with RVR, was in atrial fib in 04/2011 09/16/2012    a) s/p MAZE (09/2012)   . Tobacco use 09/16/2012  . Heart murmur   . Chronic combined systolic and diastolic CHF (congestive heart failure) 09/16/2012    a) ECHO (08/2013) EF 50-55%, grade II DD b) TEE ECHO (09/2013): 40-45%, bioprosthesis present, mild MR  . Aortic stenosis, severe 09/23/2012  . CAD (coronary artery disease), single vessel disease 09/23/2012    a) CABG (SVG to PDA and PL) wtih AVR (09/2012)   . PAD (peripheral artery disease), decreased bil. ABIs 09/23/2012    a) ABIs (11/2013): Right mild arterial insufficiency, L normal; aroto-bifem bypass graft: not well visualized, bilateral SFAs = to greater than 50% in dimaeter reduction   . Gout flare. 09/29/12, Lt knee, improved with colchicine. 09/30/2012  . H/O aortic valve replacement   . AS (aortic stenosis) with AVR with pericardial tissue valve  09/30/2013  . Back pain, spinal stenosis 09/30/2013  . COPD (chronic obstructive pulmonary disease)   . Bilateral carotid artery disease    Past Surgical History  Procedure Laterality Date  . Iliac artery stent  10/20/08    stent to lt iliac  . Aorto bifem bypass  12/18/08    by Dr. Trula Slade  . Coronary artery bypass graft N/A 10/08/2012    Procedure: CORONARY ARTERY BYPASS GRAFTING (CABG);   Surgeon: Grace Isaac, MD;  Location: [facility];  Service: Open Heart Surgery;  Laterality: N/A;  Coronary Artery bypass Graft times two utilizing the left greater saphenous vein harvested endoscopically  . Aortic valve replacement N/A 10/08/2012    Procedure: AORTIC VALVE REPLACEMENT (AVR);  Surgeon: Grace Isaac, MD;  Location: [facility];  Service: Open Heart Surgery;  Laterality: N/A;  . Maze N/A 10/08/2012    Procedure: MAZE;  Surgeon: Grace Isaac, MD;  Location: [facility];  Service: Open Heart Surgery;  Laterality: N/A;  . Intraoperative transesophageal echocardiogram N/A 10/08/2012    Procedure: INTRAOPERATIVE TRANSESOPHAGEAL ECHOCARDIOGRAM;  Surgeon: Grace Isaac, MD;  Location: [facility];  Service: Open Heart Surgery;  Laterality: N/A;  . Endovein harvest of greater saphenous vein Bilateral 10/08/2012    Procedure: ENDOVEIN HARVEST OF GREATER SAPHENOUS VEIN;  Surgeon: Grace Isaac, MD;  Location: [facility];  Service: Open Heart Surgery;  Laterality: Bilateral;  . US echocardiography  06/20/2011    mod concentric LVH,LA severely dilated,RA mildly dilated,mod. ca+ of the mitral apparatus,trace MR,mod. ca+ AOV w/stenosis.  Marland Kitchen Nm myocar perf wall motion  08/25/2008    normal  . Cardioversion N/A 02/25/2013    Procedure: CARDIOVERSION;  Surgeon: Sanda Klein, MD;  Location: [facility];  Service: Cardiovascular;  Laterality: N/A;  . Tee without cardioversion N/A 09/25/2013    Procedure: TRANSESOPHAGEAL ECHOCARDIOGRAM (  TEE);  Surgeon: Thayer Headings, MD;  Location: [facility];  Service: Cardiovascular;  Laterality: N/A;  . Cardioversion N/A 09/25/2013    Procedure: CARDIOVERSION;  Surgeon: Thayer Headings, MD;  Location: [facility] ENDOSCOPY;  Service: Cardiovascular;  Laterality: N/A;  . Left and right heart catheterization with coronary angiogram N/A 09/20/2012    Procedure: LEFT AND RIGHT HEART CATHETERIZATION WITH CORONARY ANGIOGRAM;  Surgeon: Lorretta Harp, MD;  Location: [facility] CATH LAB;  Service:  Cardiovascular;  Laterality: N/A;    Medications to be continued on Rehab . antiseptic oral rinse  7 mL Mouth Rinse BID  . atorvastatin  80 mg Oral q1800  . budesonide-formoterol  2 puff Inhalation BID  . carvedilol  12.5 mg Oral BID WC  . colchicine  0.6 mg Oral BID  . digoxin  0.25 mg Oral QODAY  . metolazone  2.5 mg Oral Q M,W,F  . pantoprazole  40 mg Oral Daily  . polyethylene glycol  17 g Oral Daily  . potassium chloride SA  20 mEq Oral Daily  . senna-docusate  1 tablet Oral BID  . spironolactone  25 mg Oral Daily  . tiZANidine  8 mg Oral BID  . torsemide  20 mg Oral q1800  . torsemide  40 mg Oral QAC breakfast    LABORATORY STUDIES CBC    Component Value Date/Time   WBC 6.1 04/09/2014 0210   RBC 3.87* 04/09/2014 0210   HGB 10.3* 04/09/2014 0210   HCT 34.2* 04/09/2014 0210   PLT 177 04/09/2014 0210   MCV 88.4 04/09/2014 0210   MCH 26.6 04/09/2014 0210   MCHC 30.1 04/09/2014 0210   RDW 17.0* 04/09/2014 0210   LYMPHSABS 1.9 04/09/2014 0210   MONOABS 0.7 04/09/2014 0210   EOSABS 0.1 04/09/2014 0210   BASOSABS 0.0 04/09/2014 0210   CMP    Component Value Date/Time   NA 141 04/09/2014 0210   K 3.9 04/09/2014 0210   CL 100 04/09/2014 0210   CO2 31 04/09/2014 0210   GLUCOSE 128* 04/09/2014 0210   BUN 18 04/09/2014 0210   CREATININE 1.35 04/09/2014 0210   CREATININE 1.38* 12/31/2013 1100   CALCIUM 8.9 04/09/2014 0210   PROT 6.7 04/09/2014 0210   ALBUMIN 2.7* 04/09/2014 0210   AST 84* 04/09/2014 0210   ALT 26 04/09/2014 0210   ALKPHOS 66 04/09/2014 0210   BILITOT 0.4 04/09/2014 0210   GFRNONAA 54* 04/09/2014 0210   GFRNONAA 57* 10/23/2013 1007   GFRAA 63* 04/09/2014 0210   GFRAA 66 10/23/2013 1007   COAGS Lab Results  Component Value Date   INR 1.15 04/07/2014   INR 1.04 09/19/2013   INR 1.33 02/25/2013   Lipid Panel    Component Value Date/Time   CHOL 197 12/31/2013 1102   TRIG 90 12/31/2013 1102   HDL 73 12/31/2013 1102   CHOLHDL 2.7 12/31/2013  1102   VLDL 18 12/31/2013 1102   LDLCALC 106* 12/31/2013 1102   HgbA1C  Lab Results  Component Value Date   HGBA1C 5.2 10/07/2012   Cardiac Panel (last 3 results) No results for input(s): CKTOTAL, CKMB, TROPONINI, RELINDX in the last 72 hours. Urinalysis    Component Value Date/Time   COLORURINE YELLOW 04/07/2014 1803   APPEARANCEUR CLEAR 04/07/2014 1803   LABSPEC 1.009 04/07/2014 1803   PHURINE 5.5 04/07/2014 1803   GLUCOSEU NEGATIVE 04/07/2014 1803   HGBUR TRACE* 04/07/2014 1803   BILIRUBINUR NEGATIVE 04/07/2014 1803   KETONESUR NEGATIVE 04/07/2014 1803   PROTEINUR 30*  04/07/2014 1803   UROBILINOGEN 0.2 04/07/2014 1803   NITRITE NEGATIVE 04/07/2014 1803   LEUKOCYTESUR NEGATIVE 04/07/2014 1803   Urine Drug Screen     Component Value Date/Time   LABOPIA NONE DETECTED 04/07/2014 1803   COCAINSCRNUR NONE DETECTED 04/07/2014 1803   LABBENZ NONE DETECTED 04/07/2014 1803   AMPHETMU NONE DETECTED 04/07/2014 1803   THCU NONE DETECTED 04/07/2014 1803   LABBARB NONE DETECTED 04/07/2014 1803    Alcohol Level    Component Value Date/Time   ETH <11 04/07/2014 1607    SIGNIFICANT DIAGNOSTIC STUDIES Ct Head Wo Contrast 04/10/2014  1. Stable intra-axial hemorrhage centered at the right thalamus with small volume intraventricular extension of blood.  2. Stable mild regional mass effect. No ventriculomegaly.  3. No new intracranial abnormality.   CT of the brain  04/07/14:  IMPRESSION: 1. Acute posterior RIGHT basal ganglia and internal capsule hemorrhage measuring 15 mm x 23 mm. 2. 4 mm of RIGHT to LEFT midline shift. 3. Area of low attenuation in the posterior LEFT temporal lobe may represent subacute ischemia but the appearance is more suggestive of either an old infarct or asymmetric chronic ischemic white matter disease.  CT brain 04/08/14:  IMPRESSION: Stable exams inch yesterday. Right thalamic intraparenchymal hematoma again measured at 15 x 24 mm. Mild  surrounding edema. Small amount of intraventricular hemorrhage, not increased and without hydrocephalus.  EKG Ectopic atrial tachycardia, unifocal Nonspecific ST and T wave abnormality    HISTORY OF PRESENT ILLNES [name] is an 63 y.o. male who has known Afib on Eliquis (last took pill at 0800 today). He was with his friends today at 1530 when he suddenly felt dizzy and then had weakness on the left arm>leg. He slumped to the left side and his friends helped ease him to the floor. He did sustain a skin tear on the left arm and left shin. EMS was called and due to noting left arm flaccidity and left facial droop --Code stroke was called. Initial CT head showed a right IC basal ganglia hemorrhage. BP 158/85. Order for [place] placed. Currently he is awake and alert in no distress. Complaining of left arm and leg weakness along with mild slurred speech.   Date last known well: Date: 04/07/2014 Time last known well: Time: 15:30 tPA Given: No: [place] is an 63 y.o. male who has known Afib on Eliquis (last took pill at 0800 today). He was with his friends today at 1530 when he suddenly felt dizzy and then had weakness on the left arm>leg. He slumped to the left side and his friends helped ease him to the floor. He did sustain a skin tear on the left arm and left shin. EMS was called and due to noting left arm flaccidity and left facial droop --Code stroke was called. Initial CT head showed a right IC basal ganglia hemorrhage. BP 158/85. He was admitted and had reversal of anticoagulant. Initial CT scan od brain on 04/07/14 showed Acute posterior RIGHT basal ganglia and internal capsule hemorrhage measuring 15 mm x 23 mm. 4 mm of RIGHT to LEFT midline shift. Repeat CT brain on 04/08/14 Right thalamic intraparenchymal hematoma again measured at 15 x 24 mm. Mild surrounding edema. Small amount of intraventricular hemorrhage, not increased and without  hydrocephalus. CT brain on 04/10/14 of brain revealed Stable intra-axial hemorrhage centered at the right thalamus with small volume intraventricular extension of blood. Stable mild regional mass effect. No ventriculomegaly. He was  evaluated by PT/OT and was found to be a good candidate  for Rehab.  Patient continues to have left upper and lower extremity weakness but has improved since his admission. Rest of his hospital stay was uneventful. His sodium was 141 potassium 2.9 BUN of 18 creatinine 1.39 and glucose of 128. White blood count 6.1 hemoglobin 10.3 platelet count of 177. EKG revealed tachycardia. Patient was evaluated by speech therapy and is cleared for dysphagia diet with thickened liquid.  Patient is okay for discharge to rehabilitation as determined by rehabilitation physician. He will need a repeat CT scan of his head and 3-4 weeks. If CT of his head is normal with either stable ICH and or resolution of blood will consider restarting anticoagulation therapy. He will need a follow-up with Dr. Erlinda Hong in 3-4 weeks' time as an outpatient.  DISCHARGE EXAM Blood pressure 109/50, pulse 115, temperature 97.7 F (36.5 C), temperature source Oral, resp. rate 20, height 5\' 10"  (1.778 m), weight 103.4 kg (227 lb 15.3 oz), SpO2 95 %.  Physical Exam  General: The patient is alert and cooperative at the time of the examination. The patient is moderately obese.  Respiratory: Lung fields are clear  Abdomen: Obese, slightly tender with palpation  Cardiovascular: Occasional irregular heart rhythm, no obvious murmurs or rubs noted.  Skin: No significant peripheral edema is noted.   Neurologic Exam  Mental status: The patient is oriented x 3.  Cranial nerves: Facial symmetry is present. Speech is normal, no aphasia or dysarthria is noted. Extraocular movements are full. Visual fields are full. tongue protrudes in the midline. Decreased sensation left side of face.  Motor: The patient has good  strength the right extremities. on the left side, the left arm is now 4 minus/5 strength, the patient is able to elevate the left arm above the head, but there is poor grip with the left hand. The left leg can be elevated against gravity, 4/5 strength.  Sensory examination: the patient has profound decreased soft touch and pinprick sensation on the left face, arm, and leg, almost anesthetic.  Coordination: The patient has good finger-nose-finger and heel-to-shin on the right, the patient has significant ataxia with the left upper extremity, has difficulty with the left leg.  Gait and station: gait was not tested.  Discharge Diet  DIET DYS 3 Nectar-thick liquids  DISCHARGE PLAN  Disposition:  Transfer to [facility] for ongoing PT, OT and ST  no antithrombotic for secondary stroke prevention.  Recommend ongoing risk factor control by Primary Care Physician at time of discharge from inpatient rehabilitation.  Follow-up RAMACHANDRAN,AJITH, MD in 2 weeks following discharge from rehab.  Follow-up with Dr. Antony Contras, Stroke Clinic in 1 month.   Need to repeat CT head in 3-4 weeks.  If stable patient can restart Eliquis  60 minutes were spent preparing discharge.  I, the attending vascular neurologist, have personally obtained a history, examined the patient, evaluated laboratory data, individually viewed imaging studies. Together with the NP/PA, we formulated the assessment and plan of care which reflects our mutual decision.  I have made any additions or clarifications directly to the above note and agree with the findings and plan as currently documented.   Rosalin Hawking, MD PhD Stroke Neurology 04/14/2014 11:44 PM

## 2014-04-13 NOTE — Progress Notes (Signed)
Patient ID: [name], male   DOB: Oct 12, 1950, 63 y.o.   MRN: [id number] Patient admitted to 4M07 via bed, escorted by nursing staff and family.  Patient and family verbalized understanding of rehab process, signed fall safety agreement.  Patient hooked up to o2 [address] [place] at 2L.  Will continue to monitor.  Brita Romp, RN

## 2014-04-13 NOTE — Progress Notes (Signed)
Physical Therapy Treatment Patient Details Name: [name] MRN: [id number] DOB: [birth year] Today's Date: 04/13/2014    History of Present Illness 63 yo male admitted due to sudden dizziness and weakness on L UE/ LE. Pt s/p fall with (A) from friends to floor with skin tear. pt with slurred speech. CT (+) R basal ganglia with internal capsula . Further workup pending. PMH: AFib,COPD, HTN, CAD, aortic stenosis s/p valve replacement, gout, PAD,    PT Comments    Patient able to progress with therapy this session. Able to sit midline with VCs for positioning and technique with no UE support. Able to use steady to get to recliner. Patient with increased R knee pain in standing due to ?gout per patient report. RN aware. Continue to recommend comprehensive inpatient rehab (CIR) for post-acute therapy needs.   Follow Up Recommendations  CIR     Equipment Recommendations       Recommendations for Other Services       Precautions / Restrictions Precautions Precautions: Fall    Mobility  Bed Mobility Overal bed mobility: Needs Assistance;+2 for physical assistance   Rolling: Min assist   Supine to sit: Min assist     General bed mobility comments: Patient able to move LE off EOB with initiation of LLE positioning and utilize rails to support trunk into sitting.   Transfers Overall transfer level: Needs assistance Equipment used:  (steady) Transfers: Sit to/from Stand Sit to Stand: Max assist;+2 physical assistance         General transfer comment: Utilized stedy for transfer to recliner. Patient able to stand with +2 assist but having increased R knee pain due to ? gout per patient report. Heavy L lean with cues and facilitation to correct  Ambulation/Gait                 Stairs            Wheelchair Mobility    Modified Rankin (Stroke Patients Only) Modified Rankin (Stroke Patients Only) Pre-Morbid Rankin Score: Slight disability Modified Rankin:  Severe disability     Balance     Sitting balance-Leahy Scale: Fair Sitting balance - Comments: Patient with increased midline posture this session. REquired cues to maintain for increased time.      Standing balance-Leahy Scale: Zero                      Cognition Arousal/Alertness: Awake/alert Behavior During Therapy: WFL for tasks assessed/performed Overall Cognitive Status: Within Functional Limits for tasks assessed                      Exercises      General Comments        Pertinent Vitals/Pain Pain Score: 5  Pain Location: R knee Pain Descriptors / Indicators: Sore Pain Intervention(s): Monitored during session;Repositioned    Home Living                      Prior Function            PT Goals (current goals can now be found in the care plan section) Progress towards PT goals: Progressing toward goals    Frequency  Min 4X/week    PT Plan Current plan remains appropriate    Co-evaluation             End of Session Equipment Utilized During Treatment: Gait belt Activity Tolerance: Patient tolerated treatment well Patient left: in  chair;with call bell/phone within reach     Time: 1003-1028 PT Time Calculation (min) (ACUTE ONLY): 25 min  Charges:  $Therapeutic Activity: 23-37 mins                    G Codes:      Jacqualyn Posey 04/13/2014, 12:12 PM  04/13/2014 Jacqualyn Posey PTA [phone] pager [phone] office

## 2014-04-13 NOTE — Progress Notes (Signed)
VASCULAR LAB PRELIMINARY  PRELIMINARY  PRELIMINARY  PRELIMINARY  Carotid duplex  completed.    Preliminary report:  Right:  40-59% internal carotid artery stenosis.   Left:  60-79% internal carotid artery stenosis.   Bilateral:  Vertebral artery flow is antegrade.     Tajha Sammarco, RVT 04/13/2014, 11:21 AM

## 2014-04-13 NOTE — Discharge Instructions (Signed)
Hemorrhagic Stroke  A hemorrhagic stroke occurs when a blood vessel in the brain leaks or bursts. Areas of the brain that should receive blood, oxygen, and nutrients from the damaged blood vessel are deprived of blood flow. This causes areas of the brain to become damaged. Damage also occurs to areas of the brain where the leaked blood accumulates and presses on normal tissue. This is a medical emergency. This can cause permanent damage and loss of brain function.  CAUSES   A hemorrhagic stroke is caused by a decrease of oxygen supply to an area of your brain. It is the result of a blood vessel that leaks or ruptures. The leaking or rupturing blood vessel occurs due to one of the following conditions:  · A ballooning of a weak section in a blood vessel (aneurysm).  · Hardened, thin blood vessels. Blood vessel walls lined with plaque becoming thin and hardened. These hardened, thin artery walls can crack open and allow blood to flow out of the blood vessel.  · An abnormal formation of a blood vessel (arteriovenous malformation). This condition results in an abnormal tangle of thin-walled blood vessels.  Once the blood vessel ruptures, bleeding occurs. The blood from the ruptured blood vessel accumulates and compresses the surrounding brain tissue. Hemorrhagic strokes are classified as to the location of the bleed. If the bleeding occurs within the brain tissue, the condition is called an intracerebral hemorrhage. If the bleeding occurs in the area between the brain and the thin tissues that cover the brain, the condition is called a subarachnoid hemorrhage.   RISK FACTORS  · High blood pressure (hypertension).  · Abnormal blood vessels present since birth.  · Bleeding disorders, such as hemophilia, sickle cell disease, or liver disease.  · The blood becoming too thin while taking blood thinners (anticoagulants).  · Smoking.  · Excessive alcohol use.  · Use of illicit drugs (especially cocaine or  methamphetamine).  SYMPTOMS   · Sudden, severe headache with no known cause. The headache is often described as the worst headache ever experienced.  · Nausea or vomiting, especially when combined with other symptoms such as a headache.  · Sudden weakness or numbness of the face, arm, or leg, especially on one side of the body.  · Sudden trouble walking or difficulty moving the legs.  · Sudden confusion.  · Sudden personality changes.  · Trouble speaking (aphasia) or understanding.  · Difficulty swallowing.  · Sudden trouble seeing in one or both eyes.  · Double vision.  · Dizziness.  · Loss of balance or coordination.  · Intolerance to light.  · Stiff neck.  DIAGNOSIS   Your health care provider will often suspect a hemorrhagic stroke based on your symptoms, history, and exam. A CT scan of the brain is usually performed. This is done to confirm the presence of bleeding in the brain, to look for causes, and to determine severity. Other tests may be done, including:  · An MRI.  · Angiography.  · Blood tests.  TREATMENT   The goals of treatment are to try to stop the bleeding, control pressure in the brain, and relieve symptoms.  · Medicines may be given to:  ¨ Lower blood pressure (antihypertensives).  ¨ Relieve pain (analgesics).  ¨ Relieve nausea or vomiting.  ¨ Stop or prevent seizures.  ¨ Prevent the blood vessels in the brain from going into spasm in response to the presence of bleeding.  · Other medicines, blood products, or vitamin K   may also be given to control the bleeding.  · If there is a collection of blood putting pressure on your brain, or if the blood vessel continues to bleed, surgery may be required.  · Surgery may also be needed if tests reveal that there are other problems within the blood vessels of the brain that put you at an elevated risk for another bleeding event in the future.  Further treatment depends on the duration, severity, and cause of your symptoms. Physical, speech, and occupational  therapists will assess you and work to improve any functions impaired by the stroke. Measures will be taken to prevent short-term and long-term complications, including infection from breathing foreign material into the lungs (aspiration pneumonia), blood clots in the legs, bedsores, and falls.  HOME CARE INSTRUCTIONS   · Take medicines only as instructed by your health care provider.  · If swallow studies have determined that your swallowing reflex is present, you should eat healthy foods. Including 5 or more servings of fruits and vegetables a day may reduce the risk of stroke. Foods may need to be a certain consistency (soft or pureed), or small bites may need to be taken in order to avoid aspirating or choking. Certain dietary changes may be advised to address high blood pressure, high cholesterol, diabetes, or obesity.  ¨ Food choices that are low in sodium, saturated fat, trans fat, and cholesterol are recommended to manage high blood pressure.  ¨ Food choies that are high in fiber, and low in saturated fat, trans fat, and cholesterol may control cholesterol levels.  ¨ Controlling carbohydrates and sugar intake is recommended to manage diabetes.  ¨ Reducing calorie intake and making food choices that are low in sodium, saturated fat, trans fat, and cholesterol are recommended to manage obesity.  · Maintain a healthy weight.  · Stay physically active. It is recommended that you get at least 30 minutes of activity on most or all days.  · Limit alcohol use. Moderate alcohol use is considered to be:  ¨ No more than 2 drinks each day for men.  ¨ No more than 1 drink each day for nonpregnant women.  · Stop drug abuse.  · Manage any other health care conditions you may have, if applicable.  · A safe home environment is important to reduce the risk of falls. Your health care provider may arrange for specialists to evaluate your home. Having grab bars in the bedroom and bathroom is often important. Your health care  provider may arrange for special equipment to be used at home, such as raised toilets and a seat for the shower.  · Physical, occupational, and speech therapy. Ongoing therapy may be needed to maximize your recovery after a stroke. If you have been advised to use a walker or a cane, use it at all times. Be sure to keep your therapy appointments.  · Follow all instructions for follow-up with your health care provider. This is very important. This includes any referrals, physical therapy, rehabilitation, and laboratory tests. Proper follow-up can prevent another stroke from occurring.  SEEK MEDICAL CARE IF:  · You have personality changes.  · You have difficulty swallowing.  · You are seeing double.  · You have dizziness.  · You have a fever.  · You have skin breakdown.  SEEK IMMEDIATE MEDICAL CARE IF:   · You have a sudden, severe headache with no known cause.  · You have sudden nausea or vomiting with a severe headache.  · You   have sudden weakness or numbness of the face, arm, or leg, especially on one side of the body.  · You have sudden trouble walking or difficulty moving arms or legs.  · You have sudden confusion.  · You have trouble speaking (aphasia) or understanding.  · You have sudden trouble seeing in one or both eyes.  · You have a sudden loss of balance or coordination.  · You have a stiff neck.  · You have difficulty breathing.  · You have a partial or total loss of consciousness.  Any of these symptoms may represent a serious problem that is an emergency. Do not wait to see if the symptoms will go away. Get medical help at once. Call your local emergency services (911 in [place] Do not drive yourself to the hospital.  Document Released: 09/28/2009 Document Revised: 08/25/2013 Document Reviewed: 11/19/2011  ExitCare® Patient Information ©2015 [employer]. This information is not intended to replace advice given to you by your health care provider. Make sure you discuss any questions you have with your  health care provider.

## 2014-04-13 NOTE — Progress Notes (Signed)
Rehab admissions - I am following pt's case and we did receive insurance authorization from [place] [facility] [place] for inpatient rehab. I spoke with Broadus John, neuro PA who gave medical clearance. We have a bed available and pt will be admitted to inpatient rehab later today.  I updated pt who was pleased with this plan. Pt stated that his dtr will be coming around noon and I will meet with pt/family around then to complete admission paperwork at that time.   I updated Hassan Rowan, case Scientist, water quality, Education officer, museum. Pt's NA/RN are also aware.   Please call me with any questions. Thanks.  Nanetta Batty, PT Rehabilitation Admissions Coordinator [phone]

## 2014-04-13 NOTE — Progress Notes (Signed)
Patient is discharged from room 4N25 and transferred to unit 4MW07 at this time. Alert and in stable condition.IV site d/c'd as well as tele. Report called in to nurse Rayetta Pigg RN. Transported via bed with all belongings and family at side.

## 2014-04-14 ENCOUNTER — Other Ambulatory Visit: Payer: Self-pay | Admitting: Neurology

## 2014-04-14 ENCOUNTER — Inpatient Hospital Stay (HOSPITAL_COMMUNITY): Payer: Self-pay | Admitting: Physical Therapy

## 2014-04-14 ENCOUNTER — Inpatient Hospital Stay (HOSPITAL_COMMUNITY): Payer: Self-pay | Admitting: Speech Pathology

## 2014-04-14 ENCOUNTER — Inpatient Hospital Stay (HOSPITAL_COMMUNITY): Payer: Self-pay | Admitting: Occupational Therapy

## 2014-04-14 DIAGNOSIS — I610 Nontraumatic intracerebral hemorrhage in hemisphere, subcortical: Secondary | ICD-10-CM

## 2014-04-14 LAB — GLUCOSE, CAPILLARY: Glucose-Capillary: 145 mg/dL — ABNORMAL HIGH (ref 70–99)

## 2014-04-14 NOTE — Evaluation (Signed)
Speech Language Pathology Assessment and Plan  Patient Details  Name: [name] MRN: [id number] Date of Birth: 1950/10/07  SLP Diagnosis: Dysphagia  Rehab Potential: Good ELOS: 14-21 days     Today's Date: 04/14/2014 SLP Individual Time: 0900-1000 SLP Individual Time Calculation (min): 60 min   Problem List:  Patient Active Problem List   Diagnosis Date Noted  . Acute left hemiparesis 04/13/2014  . ICH (intracerebral hemorrhage) 04/07/2014  . Bilateral carotid artery disease 03/04/2014  . Acute cor pulmonale 01/14/2014  . RVF (right ventricular failure) 01/14/2014  . Hypotension 10/23/2013  . Chronic combined systolic and diastolic heart failure 44/04/270  . Back pain, spinal stenosis 09/30/2013  . Alcohol abuse 09/28/2013  . 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 Chronic anticoagulation 11/13/2012  . Long term (current) use of anticoagulants, Eliquis 10/23/2012  . Chronic a-fib- failed Amio-DCCV  10/23/2012  . Gout flare. 09/29/12, Lt knee, improved with colchicine. 09/30/2012  . Aortic stenosis, severe- Tissue AVR 5/14 09/23/2012  . CAD, CABG X 2 with SVG-PD/PL 5/14 09/23/2012  . Cellulitis, lower extremity- treated-May 2015 09/23/2012  . PAD (peripheral artery disease), decreased bil. ABIs 09/23/2012  . COPD on home oxygen 09/23/2012  . Acute respiratory failure, with BiPAP needed on admit 09/16/2012  . PAF - Maze at the time of his CABG/AVR 5/14. Recurrent a fib On Amiodarone, DCCV this admit with recurrent a fib, now persistent a fib 09/16/2012  . HTN (hypertension) 09/16/2012  . Hyperlipidemia 09/16/2012  . Noncompliance, no meds for two weeks prior to admission 09/16/2012  . Tobacco use 09/16/2012  . Acute combined systolic and diastolic CHF - EF  53-66% June 2015  09/16/2012  . Bilateral lower extremity edema, secondary to chronic CHF 09/16/2012   Past Medical History:  Past Medical History  Diagnosis Date  . Hypertension   . Gout   . Aortic  stenosis     echo 06/19/08 with nomal LV function, moderate concentric LVH moderate aortic stenosis area 0.99 cm squared, peak gradient of 50 and mean of 31  . Hyperlipidemia   . Atrial fibrillation with RVR, was in atrial fib in 04/2011 09/16/2012    a) s/p MAZE (09/2012)   . Tobacco use 09/16/2012  . Heart murmur   . Chronic combined systolic and diastolic CHF (congestive heart failure) 09/16/2012    a) ECHO (08/2013) EF 50-55%, grade II DD b) TEE ECHO (09/2013): 40-45%, bioprosthesis present, mild MR  . Aortic stenosis, severe 09/23/2012  . CAD (coronary artery disease), single vessel disease 09/23/2012    a) CABG (SVG to PDA and PL) wtih AVR (09/2012)   . PAD (peripheral artery disease), decreased bil. ABIs 09/23/2012    a) ABIs (11/2013): Right mild arterial insufficiency, L normal; aroto-bifem bypass graft: not well visualized, bilateral SFAs = to greater than 50% in dimaeter reduction   . Gout flare. 09/29/12, Lt knee, improved with colchicine. 09/30/2012  . H/O aortic valve replacement   . AS (aortic stenosis) with AVR with pericardial tissue valve  09/30/2013  . Back pain, spinal stenosis 09/30/2013  . COPD (chronic obstructive pulmonary disease)   . Bilateral carotid artery disease    Past Surgical History:  Past Surgical History  Procedure Laterality Date  . Iliac artery stent  10/20/08    stent to lt iliac  . Aorto bifem bypass  12/18/08    by Dr. Trula Slade  . Coronary artery bypass graft N/A 10/08/2012    Procedure: CORONARY ARTERY BYPASS GRAFTING (CABG);  Surgeon:  Grace Isaac, MD;  Location: [facility];  Service: Open Heart Surgery;  Laterality: N/A;  Coronary Artery bypass Graft times two utilizing the left greater saphenous vein harvested endoscopically  . Aortic valve replacement N/A 10/08/2012    Procedure: AORTIC VALVE REPLACEMENT (AVR);  Surgeon: Grace Isaac, MD;  Location: [facility];  Service: Open Heart Surgery;  Laterality: N/A;  . Maze N/A 10/08/2012    Procedure: MAZE;  Surgeon: Grace Isaac, MD;  Location: [facility];  Service: Open Heart Surgery;  Laterality: N/A;  . Intraoperative transesophageal echocardiogram N/A 10/08/2012    Procedure: INTRAOPERATIVE TRANSESOPHAGEAL ECHOCARDIOGRAM;  Surgeon: Grace Isaac, MD;  Location: [facility];  Service: Open Heart Surgery;  Laterality: N/A;  . Endovein harvest of greater saphenous vein Bilateral 10/08/2012    Procedure: ENDOVEIN HARVEST OF GREATER SAPHENOUS VEIN;  Surgeon: Grace Isaac, MD;  Location: [facility];  Service: Open Heart Surgery;  Laterality: Bilateral;  . US echocardiography  06/20/2011    mod concentric LVH,LA severely dilated,RA mildly dilated,mod. ca+ of the mitral apparatus,trace MR,mod. ca+ AOV w/stenosis.  Marland Kitchen Nm myocar perf wall motion  08/25/2008    normal  . Cardioversion N/A 02/25/2013    Procedure: CARDIOVERSION;  Surgeon: Sanda Klein, MD;  Location: [facility];  Service: Cardiovascular;  Laterality: N/A;  . Tee without cardioversion N/A 09/25/2013    Procedure: TRANSESOPHAGEAL ECHOCARDIOGRAM (TEE);  Surgeon: Thayer Headings, MD;  Location: [facility];  Service: Cardiovascular;  Laterality: N/A;  . Cardioversion N/A 09/25/2013    Procedure: CARDIOVERSION;  Surgeon: Thayer Headings, MD;  Location[facility] ENDOSCOPY;  Service: Cardiovascular;  Laterality: N/A;  . Left and right heart catheterization with coronary angiogram N/A 09/20/2012    Procedure: LEFT AND RIGHT HEART CATHETERIZATION WITH CORONARY ANGIOGRAM;  Surgeon: Lorretta Harp, MD;  Location: [facility] CATH LAB;  Service: Cardiovascular;  Laterality: N/A;    Assessment / Plan / Recommendation Clinical Impression   [name] is a 63 y.o. right handed male history of HTN, COPD-oxygen dependent, A fib on Eliquis, s/p MAZE June 2014. He was independent prior to admission, lives alone and was using a walker due to back pain. He was admitted on 04/07/2014 past with left-sided weakness and mild slurred speech. CCT showed a right IC basal ganglia hemorrhage with a  4 mm right to left midline shift and BP 158/85 at admission. Swallow evaluation done revealing dysphagia and patient placed on a mechanical soft nectar thick liquid diet. Pt admitted to CIR on 04/13/2014 with SLP evaluation completed 04/14/2014 with the following results: Pt presents with s/s of a mild-moderate oropharyngeal dysphagia characterized by left labial, lingual and buccal weakness and decreased sensation.  The abovementioned deficits resulted in prolonged mastication of solid consistencies.  Furthermore, pt presented with suspected delayed swallow initiation with wet vocal quality noted with larger boluses of nectar thick liquids.  No overt s/s of aspiration noted with small amounts of thin, nectar thick liquids, purees, or solid consistencies.  Recommend that pt continue on dys 3 solids with nectar thick liquids; full supervision for use of swallowing precautions.  Informally, pt presents with grossly intact cognitive function for basic, familiar tasks.  However, SLP will continue to follow up for ongoing diagnostic treatments of cognitive function and update pt's goals and plan of care as indicated.      Pt would benefit from skilled ST while inpatient in order to maximize functional independence and reduce burden of care prior to discharge.  Skilled Therapeutic Interventions          Bedside swallowing evaluation completed with results and recommendations reviewed with patient.     SLP Assessment  Patient will need skilled Speech Lanaguage Pathology Services during CIR admission    Recommendations  Diet Recommendations: Dysphagia 3 (Mechanical Soft);Nectar-thick liquid Liquid Administration via: Cup;No straw Medication Administration: Crushed with puree Supervision: Patient able to self feed;Full supervision/cueing for compensatory strategies Compensations: Slow rate;Small sips/bites;Multiple dry swallows after each bite/sip;Follow solids with liquid Postural Changes and/or Swallow  Maneuvers: Seated upright 90 degrees;Upright 30-60 min after meal Oral Care Recommendations: Oral care BID Patient destination: [place] (SNF) Follow up Recommendations: Other (comment) (TBD ) Equipment Recommended: None recommended by SLP    SLP Frequency 5 out of 7 days   SLP Treatment/Interventions Cueing hierarchy;Dysphagia/aspiration precaution training;Functional tasks;Patient/family education;Oral motor exercises;Internal/external aids;Environmental controls    Pain Pain Assessment Pain Assessment: No/denies pain Pain Score: 0-No pain Prior Functioning Cognitive/Linguistic Baseline: Within functional limits Type of Home: Apartment  Lives With: Alone Available Help at Discharge: Family Education: B.S. Vocation: Retired  Short Term Goals: Week 1: SLP Short Term Goal 1 (Week 1): Pt will tolerate presentations of his currently prescribed diet with no overt s/s of aspiration and mod I use of swallowing precautions.  SLP Short Term Goal 2 (Week 1): Pt will tolerate trials of upgraded consistencies with no overt s/s of aspiration and mod I use of swallowing precautions.  SLP Short Term Goal 3 (Week 1): Pt will complete oral motor exercises targeting left sided weakness to improve mastication and manipulation of regular solid boluses with supervision.   See FIM for current functional status Refer to Care Plan for Long Term Goals  Recommendations for other services: None  Discharge Criteria: Patient will be discharged from SLP if patient refuses treatment 3 consecutive times without medical reason, if treatment goals not met, if there is a change in medical status, if patient makes no progress towards goals or if patient is discharged from hospital.  The above assessment, treatment plan, treatment alternatives and goals were discussed and mutually agreed upon: by patient   Windell Moulding, M.A. CCC-SLP  Bela Bonaparte, Selinda Orion 04/14/2014, 12:54 PM

## 2014-04-14 NOTE — Discharge Instructions (Signed)
Inpatient Rehab Discharge Instructions  [name] Discharge date and time:    Activities/Precautions/ Functional Status: Activity: activity as tolerated Diet:  Wound Care: none needed Functional status:  ___ No restrictions     ___ Walk up steps independently ___ 24/7 supervision/assistance   ___ Walk up steps with assistance ___ Intermittent supervision/assistance  ___ Bathe/dress independently ___ Walk with walker     ___ Bathe/dress with assistance ___ Walk Independently    ___ Shower independently ___ Walk with assistance    ___ Shower with assistance ___ No alcohol     ___ Return to work/school ________  Special Instructions:     STROKE/TIA DISCHARGE INSTRUCTIONS SMOKING Cigarette smoking nearly doubles your risk of having a stroke & is the single most alterable risk factor  If you smoke or have smoked in the last 12 months, you are advised to quit smoking for your health.  Most of the excess cardiovascular risk related to smoking disappears within a year of stopping.  Ask you doctor about anti-smoking medications  [place] Quit Line: 1-800-QUIT NOW  Free Smoking Cessation Classes [phone]  CHOLESTEROL Know your levels; limit fat & cholesterol in your diet  Lipid Panel     Component Value Date/Time   CHOL 197 12/31/2013 1102   TRIG 90 12/31/2013 1102   HDL 73 12/31/2013 1102   CHOLHDL 2.7 12/31/2013 1102   VLDL 18 12/31/2013 1102   LDLCALC 106* 12/31/2013 1102      Many patients benefit from treatment even if their cholesterol is at goal.  Goal: Total Cholesterol (CHOL) less than 160  Goal:  Triglycerides (TRIG) less than 150  Goal:  HDL greater than 40  Goal:  LDL (LDLCALC) less than 100   BLOOD PRESSURE American Stroke Association blood pressure target is less that 120/80 mm/Hg  Your discharge blood pressure is:  BP: (!) 134/49 mmHg  Monitor your blood pressure  Limit your salt and alcohol intake  Many individuals will require more than one  medication for high blood pressure  DIABETES (A1c is a blood sugar average for last 3 months) Goal HGBA1c is under 7% (HBGA1c is blood sugar average for last 3 months)  Diabetes: No known diagnosis of diabetes    Lab Results  Component Value Date   HGBA1C 5.2 10/07/2012     Your HGBA1c can be lowered with medications, healthy diet, and exercise.  Check your blood sugar as directed by your physician  Call your physician if you experience unexplained or low blood sugars.  PHYSICAL ACTIVITY/REHABILITATION Goal is 30 minutes at least 4 days per week  Activity: No driving, Therapies: See above Return to work:  N/A  Activity decreases your risk of heart attack and stroke and makes your heart stronger.  It helps control your weight and blood pressure; helps you relax and can improve your mood.  Participate in a regular exercise program.  Talk with your doctor about the best form of exercise for you (dancing, walking, swimming, cycling).  DIET/WEIGHT Goal is to maintain a healthy weight  Your discharge diet is: DIET DYS 3   liquids Your height is:  Height: 5\' 10"  (177.8 cm) Your current weight is: Weight: 97.9 kg (215 lb 13.3 oz) Your Body Mass Index (BMI) is:  BMI (Calculated): 31  Following the type of diet specifically designed for you will help prevent another stroke.  Your goal weight is:  174 lbs  Your goal Body Mass Index (BMI) is 19-24.  Healthy food habits  can help reduce 3 risk factors for stroke:  High cholesterol, hypertension, and excess weight.  RESOURCES Stroke/Support Group:  Call [phone]   STROKE EDUCATION PROVIDED/REVIEWED AND GIVEN TO PATIENT Stroke warning signs and symptoms How to activate emergency medical system (call 911). Medications prescribed at discharge. Need for follow-up after discharge. Personal risk factors for stroke. Pneumonia vaccine given:  Flu vaccine given:  My questions have been answered, the writing is legible, and I understand these  instructions.  I will adhere to these goals & educational materials that have been provided to me after my discharge from the hospital.      My questions have been answered and I understand these instructions. I will adhere to these goals and the provided educational materials after my discharge from the hospital.  Patient/Caregiver Signature _______________________________ Date __________  Clinician Signature _______________________________________ Date __________  Please bring this form and your medication list with you to all your follow-up doctor's appointments.

## 2014-04-14 NOTE — Evaluation (Signed)
Physical Therapy Assessment and Plan  Patient Details  Name: [name] MRN: [id number] Date of Birth: [birth year]  PT Diagnosis: Abnormal posture, Abnormality of gait, Hemiplegia non-dominant, Impaired sensation Impaired proprioception and Muscle weakness Rehab Potential: Good ELOS: 21 days   Today's Date: 04/14/2014 PT Individual Time: 1300-1408   PT Individual Time Calculation (min): 68 min Problem List:  Patient Active Problem List   Diagnosis Date Noted  . Acute left hemiparesis 04/13/2014  . ICH (intracerebral hemorrhage) 04/07/2014  . Bilateral carotid artery disease 03/04/2014  . Acute cor pulmonale 01/14/2014  . RVF (right ventricular failure) 01/14/2014  . Hypotension 10/23/2013  . Chronic combined systolic and diastolic heart failure 62/06/5595  . Back pain, spinal stenosis 09/30/2013  . Alcohol abuse 09/28/2013  . 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 Chronic anticoagulation 11/13/2012  . Long term (current) use of anticoagulants, Eliquis 10/23/2012  . Chronic a-fib- failed Amio-DCCV  10/23/2012  . Gout flare. 09/29/12, Lt knee, improved with colchicine. 09/30/2012  . Aortic stenosis, severe- Tissue AVR 5/14 09/23/2012  . CAD, CABG X 2 with SVG-PD/PL 5/14 09/23/2012  . Cellulitis, lower extremity- treated-May 2015 09/23/2012  . PAD (peripheral artery disease), decreased bil. ABIs 09/23/2012  . COPD on home oxygen 09/23/2012  . Acute respiratory failure, with BiPAP needed on admit 09/16/2012  . PAF - Maze at the time of his CABG/AVR 5/14. Recurrent a fib On Amiodarone, DCCV this admit with recurrent a fib, now persistent a fib 09/16/2012  . HTN (hypertension) 09/16/2012  . Hyperlipidemia 09/16/2012  . Noncompliance, no meds for two weeks prior to admission 09/16/2012  . Tobacco use 09/16/2012  . Acute combined systolic and diastolic CHF - EF  41-63% June 2015  09/16/2012  . Bilateral lower extremity edema, secondary to chronic CHF 09/16/2012    Past  Medical History:  Past Medical History  Diagnosis Date  . Hypertension   . Gout   . Aortic stenosis     echo 06/19/08 with nomal LV function, moderate concentric LVH moderate aortic stenosis area 0.99 cm squared, peak gradient of 50 and mean of 31  . Hyperlipidemia   . Atrial fibrillation with RVR, was in atrial fib in 04/2011 09/16/2012    a) s/p MAZE (09/2012)   . Tobacco use 09/16/2012  . Heart murmur   . Chronic combined systolic and diastolic CHF (congestive heart failure) 09/16/2012    a) ECHO (08/2013) EF 50-55%, grade II DD b) TEE ECHO (09/2013): 40-45%, bioprosthesis present, mild MR  . Aortic stenosis, severe 09/23/2012  . CAD (coronary artery disease), single vessel disease 09/23/2012    a) CABG (SVG to PDA and PL) wtih AVR (09/2012)   . PAD (peripheral artery disease), decreased bil. ABIs 09/23/2012    a) ABIs (11/2013): Right mild arterial insufficiency, L normal; aroto-bifem bypass graft: not well visualized, bilateral SFAs = to greater than 50% in dimaeter reduction   . Gout flare. 09/29/12, Lt knee, improved with colchicine. 09/30/2012  . H/O aortic valve replacement   . AS (aortic stenosis) with AVR with pericardial tissue valve  09/30/2013  . Back pain, spinal stenosis 09/30/2013  . COPD (chronic obstructive pulmonary disease)   . Bilateral carotid artery disease    Past Surgical History:  Past Surgical History  Procedure Laterality Date  . Iliac artery stent  10/20/08    stent to lt iliac  . Aorto bifem bypass  12/18/08    by Dr. Trula Slade  . Coronary artery bypass graft N/A 10/08/2012  Procedure: CORONARY ARTERY BYPASS GRAFTING (CABG);  Surgeon: Grace Isaac, MD;  Location: [place];  Service: Open Heart Surgery;  Laterality: N/A;  Coronary Artery bypass Graft times two utilizing the left greater saphenous vein harvested endoscopically  . Aortic valve replacement N/A 10/08/2012    Procedure: AORTIC VALVE REPLACEMENT (AVR);  Surgeon: Grace Isaac, MD;  Location: [place];  Service:  Open Heart Surgery;  Laterality: N/A;  . Maze N/A 10/08/2012    Procedure: MAZE;  Surgeon: Grace Isaac, MD;  Location: [place];  Service: Open Heart Surgery;  Laterality: N/A;  . Intraoperative transesophageal echocardiogram N/A 10/08/2012    Procedure: INTRAOPERATIVE TRANSESOPHAGEAL ECHOCARDIOGRAM;  Surgeon: Grace Isaac, MD;  Location: [place];  Service: Open Heart Surgery;  Laterality: N/A;  . Endovein harvest of greater saphenous vein Bilateral 10/08/2012    Procedure: ENDOVEIN HARVEST OF GREATER SAPHENOUS VEIN;  Surgeon: Grace Isaac, MD;  Location: [place];  Service: Open Heart Surgery;  Laterality: Bilateral;  . US echocardiography  06/20/2011    mod concentric LVH,LA severely dilated,RA mildly dilated,mod. ca+ of the mitral apparatus,trace MR,mod. ca+ AOV w/stenosis.  Marland Kitchen Nm myocar perf wall motion  08/25/2008    normal  . Cardioversion N/A 02/25/2013    Procedure: CARDIOVERSION;  Surgeon: Sanda Klein, MD;  Location: [place];  Service: Cardiovascular;  Laterality: N/A;  . Tee without cardioversion N/A 09/25/2013    Procedure: TRANSESOPHAGEAL ECHOCARDIOGRAM (TEE);  Surgeon: Thayer Headings, MD;  Location: [place];  Service: Cardiovascular;  Laterality: N/A;  . Cardioversion N/A 09/25/2013    Procedure: CARDIOVERSION;  Surgeon: Thayer Headings, MD;  Location[facility] END[facility]COPY;  Service: Cardiovascular;  Laterality: N/A;  . Left and right heart catheterization with coronary angiogram N/A 09/20/2012    Procedure: LEFT AND RIGHT HEART CATHETERIZATION WITH CORONARY ANGIOGRAM;  Surgeon: Lorretta Harp, MD;  Location: [facility] CATH LAB;  Service: Cardiovascular;  Laterality: N/A;    Assessment & Plan Clinical Impression: [name] is a 63 y.o. right handed male history of HTN, COPD-oxygen dependent, A fib on Eliquis, s/p MAZE June 2014. He was independent prior to admission, lives alone and was using a walker due to back pain. He was admitted on 04/07/2014 past with left-sided  weakness and mild slurred speech. CCT showed a right IC basal ganglia hemorrhage with a 4 mm right to left midline shift and BP 158/85 at admission. Eliquis was reversed with Kcentra and BP monitored closely. Recommendations to repeat CCT in 3-4 weeks to help determine ability to resume Eliquis. Repeat carotid dopplers done revealing 60- 79% L-ICA and 40- 59% R-ICA stenosis.Swallow evaluation done revealing dysphagia and patient placed on a mechanical soft nectar thick liquid diet. Patient has been reporting increase in right knee pain for the past few days with question of gout flare. Therapy ongoing and CIR recommended by rehab team and MD. Patient with resultant left sided weakness with dysphagia and admitted to rehab today. Patient transferred to CIR on 04/13/2014 .   Patient currently requires Total Assist of 2 with mobility secondary to muscle weakness, decreased oxygen support, impaired timing and sequencing, unbalanced muscle activation and decreased motor planning, decreased midline orientation and decreased attention to left and decreased sitting balance, decreased standing balance, decreased postural control, hemiplegia and decreased balance strategies.  Prior to hospitalization, patient was modified independent  with mobility and lived with Alone in a [place] home.  Home access is  Level entry.  Patient will benefit from skilled PT  intervention to maximize safe functional mobility, minimize fall risk and (possibly) decrease caregiver burden for planned discharge home with 24 hour supervision vs SNF (pending available support at D/C).  Anticipate patient will benefit from follow up [place] vs ongoing PT at next venue of care at discharge.  PT - End of Session Activity Tolerance: Tolerates 30+ min activity without fatigue Endurance Deficit: Yes PT Assessment Rehab Potential (ACUTE/IP ONLY): Good Barriers to Discharge: Decreased caregiver support PT Patient demonstrates impairments in the  following area(s): Balance;Skin Integrity;Endurance;Motor;Perception;Safety;Sensory PT Transfers Functional Problem(s): Bed Mobility;Bed to Chair;Car;Furniture PT Locomotion Functional Problem(s): Ambulation;Wheelchair Mobility;Stairs PT Plan PT Intensity: Minimum of 1-2 x/day ,45 to 90 minutes PT Frequency: 5 out of 7 days PT Duration Estimated Length of Stay: 21 days PT Treatment/Interventions: Ambulation/gait training;Balance/vestibular training;Discharge planning;Disease management/prevention;Functional mobility training;Patient/family education;Splinting/orthotics;Therapeutic Exercise;Visual/perceptual remediation/compensation;Therapeutic Activities;Skin care/wound management;Functional electrical stimulation;Neuromuscular re-education;DME/adaptive equipment instruction;Stair training;UE/LE Strength taining/ROM;Wheelchair propulsion/positioning PT Transfers Anticipated Outcome(s): Supervision-Min A PT Locomotion Anticipated Outcome(s): Min-Mod A PT Recommendation Recommendations for Other Services: Other (comment) (None at this time) Follow Up Recommendations: 24 hour supervision/assistance;Skilled nursing facility Patient destination: Home (Home vs SNF pending available support at D/C) Equipment Recommended: To be determined  Skilled Therapeutic Intervention PT evaluation performed. See below for detailed findings. Treatment initiated. Session focused on gait training and w/c seating and positioning. Retrirved hemi height w/c to increase pt independence with self-propulsion of w/c using hemi technique. See below for detailed description of assist/cueing required with w/c mobility and gait. Educated pt on findings, goals, and plan of care. Oriented pt to rehab unit, fall precautions. Pt verbalized understanding of all education and was in full agreement with plan of care. Departed with pt seated in w/c with RN present and all needs within reach.  PT  Evaluation Precautions/Restrictions Precautions Precautions: Fall Restrictions Weight Bearing Restrictions: No General   Vital SignsTherapy Vitals Temp: 98 F (36.7 C) Temp Source: Oral Pulse Rate: (!) 113 Resp: 18 BP: 113/67 mmHg Patient Position (if appropriate): Sitting Oxygen Therapy SpO2: 99 % O2 Device: Nasal Cannula O2 Flow Rate (L/min): 2 L/min Pain Pain Assessment Pain Assessment: No/denies pain Pain Score: 0-No pain Home Living/Prior Functioning Home Living Living Arrangements: Spouse/significant other Available Help at Discharge: Family Type of Home: Apartment Home Access: Level entry Home Layout: One level Additional Comments: Patient states his sister stops by weekly to assist with some IADL tasks, patient does not have anyone available to help prn per his report  Lives With: Alone Prior Function Level of Independence: Independent with basic ADLs;Independent with transfers;Independent with homemaking with ambulation;Independent with gait  Able to Take Stairs?: Reciprically ("not very well") Driving: Yes Vocation: Retired Leisure: Hobbies-yes (Comment) Comments: pt used a RW 2/2 back pain Vision/Perception  Vision - Assessment Eye Alignment: Within Functional Limits Ocular Range of Motion: Within Functional Limits Alignment/Gaze Preference: Within Defined Limits Tracking/Visual Pursuits: Able to track stimulus in all quads without difficulty  Cognition Overall Cognitive Status: Within Functional Limits for tasks assessed (for basic tasks ) Arousal/Alertness: Awake/alert Sensation Sensation Light Touch: Impaired Detail Light Touch Impaired Details: Absent LLE;Impaired LUE Stereognosis: Impaired Detail Stereognosis Impaired Details: Impaired LUE Hot/Cold: Impaired Detail Hot/Cold Impaired Details: Impaired LUE Proprioception: Impaired Detail Proprioception Impaired Details: Absent LLE;Impaired LUE Coordination Gross Motor Movements are Fluid and  Coordinated: Yes (decreased gross motor movements & coordination > left hand) Fine Motor Movements are Fluid and Coordinated: Yes (decreased fine motor movements & coordination > left hand) Motor  Motor Motor: Hemiplegia;Abnormal postural alignment and control Motor - Skilled  Clinical Observations: L hemiplegia, lateral trunk lean to L side  Mobility Bed Mobility Bed Mobility: Sitting - Scoot to Edge of Bed Sitting - Scoot to Edge of Bed: 2: Max assist Sitting - Scoot to [employer] of Bed Details: Manual facilitation for weight shifting;Tactile cues for sequencing;Verbal cues for sequencing Transfers Transfers: Yes Sit to Stand: From chair/3-in-1;1: +2 Total assist Sit to Stand Details: Verbal cues for technique;Tactile cues for weight beaing;Manual facilitation for weight shifting Stand to Sit: To chair/3-in-1;1: +2 Total assist Stand to Sit Details (indicate cue type and reason): Tactile cues for weight shifting;Tactile cues for weight beaing;Verbal cues for technique;Verbal cues for precautions/safety Lateral/Scoot Transfers: With armrests removed;1: +2 Total assist Lateral/Scoot Transfer Details: Tactile cues for sequencing;Verbal cues for sequencing;Manual facilitation for weight shifting Locomotion  Ambulation Ambulation: Yes Ambulation/Gait Assistance: 1: +2 Total assist Ambulation Distance (Feet): 4 Feet Assistive device: None;Other (Comment) (3 musketeers) Ambulation/Gait Assistance Details: Verbal cues for gait pattern;Verbal cues for precautions/safety;Manual facilitation for weight shifting;Tactile cues for initiation;Tactile cues for posture Ambulation/Gait Assistance Details: Pt ambulated 4 steps with +3A (3 musketeers as well as additional person required for w/c follow and management of O2 tank). Gait Gait: Yes Gait Pattern: Impaired Gait Pattern: Trunk flexed;Lateral trunk lean to left;Decreased dorsiflexion - left;Step-to pattern;Decreased stance time - right;Decreased step  length - left Stairs / Additional Locomotion Stairs: No (Unsafe at this time) Architect: Yes Wheelchair Assistance: 2: Max Lexicographer: Right upper extremity;Right lower extremity Wheelchair Parts Management: Needs assistance Distance: 15  Trunk/Postural Assessment  Cervical Assessment Cervical Assessment: Exceptions to [facility] (forward head) Thoracic Assessment Thoracic Assessment: Exceptions to [facility] (thoracic kpyhosis) Lumbar Assessment Lumbar Assessment: Exceptions to [facility] (posterior pelvic tilt) Postural Control Postural Control: Deficits on evaluation Trunk Control: L lateral trunk lean in standing>seated. Postural Limitations: In seated, majority of weight on L; trunk rotated to R side.  Balance Balance Balance Assessed: Yes Dynamic Sitting Balance Dynamic Sitting - Balance Support: Feet supported Dynamic Sitting - Level of Assistance: 5: Stand by assistance;4: Min assist Static Standing Balance Static Standing - Balance Support: During functional activity;Right upper extremity supported Static Standing - Level of Assistance: 3: Mod assist;2: Max assist Static Standing - Comment/# of Minutes: Static standing with therapist positioned under LUE, RUE support at rail. Pt required mod-max A secondary to pt pushing trunk to L side. Extremity Assessment  RUE Assessment RUE Assessment: Within Functional Limits LUE Assessment LUE Assessment: Exceptions to [facility] LUE AROM (degrees) LUE Overall AROM Comments: patient with decreased strengh in LUE causing AROM to be limited LUE PROM (degrees) LUE Overall PROM Comments: PROM is WFL LUE Strength LUE Overall Strength Comments: decreased strength throughout LUE. Shoulder grossly 2/5, elbow and wrist grossly 3+5 RLE Assessment RLE Assessment: Within Functional Limits LLE Assessment LLE Assessment: Exceptions to [facility] LLE Strength Left Hip Flexion: 3-/5 Left Knee Flexion: 3-/5 Left Knee  Extension: 3+/5 Left Ankle Dorsiflexion: 3-/5 Left Ankle Plantar Flexion: 3+/5  FIM:  FIM - Bed/Chair Transfer Bed/Chair Transfer: 1: Two helpers FIM - Locomotion: Wheelchair Distance: 15 Locomotion: Wheelchair: 1: Travels less than 50 ft with maximal assistance (Pt: 25 - 49%) FIM - Locomotion: Ambulation Ambulation/Gait Assistance: 1: +2 Total assist;Other (comment) (+3A for w/c follow and management of O2) Locomotion: Ambulation: 1: Two helpers FIM - Locomotion: Stairs Locomotion: Stairs: 0: Activity did not occur (Unsafe at this time)   Refer to Care Plan for Long Term Goals  Recommendations for other services: None  Discharge Criteria: Patient will be  discharged from PT if patient refuses treatment 3 consecutive times without medical reason, if treatment goals not met, if there is a change in medical status, if patient makes no progress towards goals or if patient is discharged from hospital.  The above assessment, treatment plan, treatment alternatives and goals were discussed and mutually agreed upon: by patient  Stefano Gaul 04/14/2014, 10:54 PM

## 2014-04-14 NOTE — Evaluation (Signed)
Occupational Therapy Assessment and Plan & Session Note  Patient Details  Name: [name] MRN: [id number] Date of Birth: [birth year]  OT Diagnosis: abnormal posture, hemiplegia affecting non-dominant side and muscle weakness (generalized) Rehab Potential: Rehab Potential: Good ELOS: 3-4 weeks   Today's Date: 04/14/2014 OT Individual Time: 6270-3500 OT Individual Time Calculation (min): 60 min     Problem List:  Patient Active Problem List   Diagnosis Date Noted  . Acute left hemiparesis 04/13/2014  . ICH (intracerebral hemorrhage) 04/07/2014  . Bilateral carotid artery disease 03/04/2014  . Acute cor pulmonale 01/14/2014  . RVF (right ventricular failure) 01/14/2014  . Hypotension 10/23/2013  . Chronic combined systolic and diastolic heart failure 93/81/8299  . Back pain, spinal stenosis 09/30/2013  . Alcohol abuse 09/28/2013  . 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 Chronic anticoagulation 11/13/2012  . Long term (current) use of anticoagulants, Eliquis 10/23/2012  . Chronic a-fib- failed Amio-DCCV  10/23/2012  . Gout flare. 09/29/12, Lt knee, improved with colchicine. 09/30/2012  . Aortic stenosis, severe- Tissue AVR 5/14 09/23/2012  . CAD, CABG X 2 with SVG-PD/PL 5/14 09/23/2012  . Cellulitis, lower extremity- treated-May 2015 09/23/2012  . PAD (peripheral artery disease), decreased bil. ABIs 09/23/2012  . COPD on home oxygen 09/23/2012  . Acute respiratory failure, with BiPAP needed on admit 09/16/2012  . PAF - Maze at the time of his CABG/AVR 5/14. Recurrent a fib On Amiodarone, DCCV this admit with recurrent a fib, now persistent a fib 09/16/2012  . HTN (hypertension) 09/16/2012  . Hyperlipidemia 09/16/2012  . Noncompliance, no meds for two weeks prior to admission 09/16/2012  . Tobacco use 09/16/2012  . Acute combined systolic and diastolic CHF - EF  37-16% June 2015  09/16/2012  . Bilateral lower extremity edema, secondary to chronic CHF 09/16/2012     Past Medical History:  Past Medical History  Diagnosis Date  . Hypertension   . Gout   . Aortic stenosis     echo 06/19/08 with nomal LV function, moderate concentric LVH moderate aortic stenosis area 0.99 cm squared, peak gradient of 50 and mean of 31  . Hyperlipidemia   . Atrial fibrillation with RVR, was in atrial fib in 04/2011 09/16/2012    a) s/p MAZE (09/2012)   . Tobacco use 09/16/2012  . Heart murmur   . Chronic combined systolic and diastolic CHF (congestive heart failure) 09/16/2012    a) ECHO (08/2013) EF 50-55%, grade II DD b) TEE ECHO (09/2013): 40-45%, bioprosthesis present, mild MR  . Aortic stenosis, severe 09/23/2012  . CAD (coronary artery disease), single vessel disease 09/23/2012    a) CABG (SVG to PDA and PL) wtih AVR (09/2012)   . PAD (peripheral artery disease), decreased bil. ABIs 09/23/2012    a) ABIs (11/2013): Right mild arterial insufficiency, L normal; aroto-bifem bypass graft: not well visualized, bilateral SFAs = to greater than 50% in dimaeter reduction   . Gout flare. 09/29/12, Lt knee, improved with colchicine. 09/30/2012  . H/O aortic valve replacement   . AS (aortic stenosis) with AVR with pericardial tissue valve  09/30/2013  . Back pain, spinal stenosis 09/30/2013  . COPD (chronic obstructive pulmonary disease)   . Bilateral carotid artery disease    Past Surgical History:  Past Surgical History  Procedure Laterality Date  . Iliac artery stent  10/20/08    stent to lt iliac  . Aorto bifem bypass  12/18/08    by Dr. Trula Slade  . Coronary artery bypass graft  N/A 10/08/2012    Procedure: CORONARY ARTERY BYPASS GRAFTING (CABG);  Surgeon: Grace Isaac, MD;  Location: [place];  Service: Open Heart Surgery;  Laterality: N/A;  Coronary Artery bypass Graft times two utilizing the left greater saphenous vein harvested endoscopically  . Aortic valve replacement N/A 10/08/2012    Procedure: AORTIC VALVE REPLACEMENT (AVR);  Surgeon: Grace Isaac, MD;  Location: [place];   Service: Open Heart Surgery;  Laterality: N/A;  . Maze N/A 10/08/2012    Procedure: MAZE;  Surgeon: Grace Isaac, MD;  Location: [place];  Service: Open Heart Surgery;  Laterality: N/A;  . Intraoperative transesophageal echocardiogram N/A 10/08/2012    Procedure: INTRAOPERATIVE TRANSESOPHAGEAL ECHOCARDIOGRAM;  Surgeon: Grace Isaac, MD;  Location: [place];  Service: Open Heart Surgery;  Laterality: N/A;  . Endovein harvest of greater saphenous vein Bilateral 10/08/2012    Procedure: ENDOVEIN HARVEST OF GREATER SAPHENOUS VEIN;  Surgeon: Grace Isaac, MD;  Location: [place];  Service: Open Heart Surgery;  Laterality: Bilateral;  . US echocardiography  06/20/2011    mod concentric LVH,LA severely dilated,RA mildly dilated,mod. ca+ of the mitral apparatus,trace MR,mod. ca+ AOV w/stenosis.  Marland Kitchen Nm myocar perf wall motion  08/25/2008    normal  . Cardioversion N/A 02/25/2013    Procedure: CARDIOVERSION;  Surgeon: Sanda Klein, MD;  Location: [place];  Service: Cardiovascular;  Laterality: N/A;  . Tee without cardioversion N/A 09/25/2013    Procedure: TRANSESOPHAGEAL ECHOCARDIOGRAM (TEE);  Surgeon: Thayer Headings, MD;  Location: [place];  Service: Cardiovascular;  Laterality: N/A;  . Cardioversion N/A 09/25/2013    Procedure: CARDIOVERSION;  Surgeon: Thayer Headings, MD;  Location: [facility] ENDOSCOPY;  Service: Cardiovascular;  Laterality: N/A;  . Left and right heart catheterization with coronary angiogram N/A 09/20/2012    Procedure: LEFT AND RIGHT HEART CATHETERIZATION WITH CORONARY ANGIOGRAM;  Surgeon: Lorretta Harp, MD;  Location: [facility] CATH LAB;  Service: Cardiovascular;  Laterality: N/A;    Assessment & Plan Clinical Impression: [name] is a 63 y.o. right handed male history of HTN, COPD-oxygen dependent, A fib on Eliquis, s/p MAZE June 2014. He was independent prior to admission, lives alone and was using a walker due to back pain. He was admitted on 04/07/2014 past with  left-sided weakness and mild slurred speech. CCT showed a right IC basal ganglia hemorrhage with a 4 mm right to left midline shift and BP 158/85 at admission. Eliquis was reversed with Kcentra and BP monitored closely. Recommendations to repeat CCT in 3-4 weeks to help determine ability to resume Eliquis. Repeat carotid dopplers done revealing 60- 79% L-ICA and 40- 59% R-ICA stenosis.Swallow evaluation done revealing dysphagia and patient placed on a mechanical soft nectar thick liquid diet. Patient has been reporting increase in right knee pain for the past few days with question of gout flare. Therapy ongoing and CIR recommended by rehab team and MD. Patient with resultant left sided weakness with dysphagia and admitted to rehab today. Patient transferred to CIR on 04/13/2014 .    Patient currently requires up to total assist +2 with basic self-care skills secondary to muscle weakness, muscle joint tightness and muscle paralysis, decreased coordination and decreased motor planning, decreased midline orientation, decreased attention to left and decreased motor planning and decreased sitting balance, decreased standing balance, decreased postural control, hemiplegia and decreased balance strategies.  Prior to hospitalization, patient could complete ADLs and IADLs independently, patient was living alone PTA. Patient states he does not have 24/7  supervision/assistance. Currently setting supervision goals.   Patient will benefit from skilled intervention to increase independence with basic self-care skills prior to discharge.  Anticipate patient will require 24 hour supervision and follow up home health.  OT - End of Session Activity Tolerance: Tolerates 30+ min activity without fatigue;Tolerates 30+ min activity with multiple rests Endurance Deficit: Yes OT Assessment Rehab Potential (ACUTE ONLY): Good Barriers to Discharge: Decreased caregiver support Barriers to Discharge Comments: Patient states he  does not have one who can be abailable to help prn OT Patient demonstrates impairments in the following area(s): Balance;Edema;Endurance;Motor;Pain;Perception;Safety;Sensory;Skin Integrity OT Basic ADL's Functional Problem(s): Eating;Grooming;Bathing;Dressing;Toileting OT Advanced ADL's Functional Problem(s):  (n/a at this time) OT Transfers Functional Problem(s): Toilet;Tub/Shower OT Additional Impairment(s): Fuctional Use of Upper Extremity OT Plan OT Intensity: Minimum of 1-2 x/day, 45 to 90 minutes OT Frequency: 5 out of 7 days OT Duration/Estimated Length of Stay: 3-4 weeks OT Treatment/Interventions: Balance/vestibular training;Community reintegration;Discharge planning;DME/adaptive equipment instruction;Functional electrical stimulation;Functional mobility training;Neuromuscular re-education;Pain management;Patient/family education;Psychosocial support;Self Care/advanced ADL retraining;Skin care/wound managment;Splinting/orthotics;Therapeutic Activities;Therapeutic Exercise;UE/LE Strength taining/ROM;UE/LE Coordination activities;Wheelchair propulsion/positioning OT Self Feeding Anticipated Outcome(s): mod I OT Basic Self-Care Anticipated Outcome(s): supervision OT Toileting Anticipated Outcome(s): supervision OT Bathroom Transfers Anticipated Outcome(s): supervision OT Recommendation Patient destination: Home (home vs SNF (depending on caregiver support at home) ) Follow Up Recommendations: Home health OT (HHOT vs SNF (depending on caregive support)) Equipment Recommended: 3 in 1 bedside comode;Tub/shower bench  Skilled Therapeutic Intervention Initial 1:1 occupational therapy evaluation completed. Focused skilled intervention on bed mobility, EOB>stand using STEDY with +2 assistance, toilet transfer, toileting, overall activity tolerance/endurance, education on attention > LUE. Patient with increased pushing > left side during sitting and standing. During sit<>stand with STEDY, patient  with skin tear; notified RN and filled out a safety portal. At end of session, left patient seated in recliner with all needs within reach.   Precautions/Restrictions  Precautions Precautions: Fall Restrictions Weight Bearing Restrictions: No   General Chart Reviewed: Yes Family/Caregiver Present: No   Vital Signs Oxygen Therapy O2 Device: Nasal Cannula O2 Flow Rate (L/min): 2 L/min   Pain Pain Assessment Pain Assessment: No/denies pain Pain Score: 0-No pain   Home Living/Prior Functioning Home Living Family/patient expects to be discharged to:: Private residence Living Arrangements: Spouse/significant other Available Help at Discharge: Family Type of Home: Apartment Home Access: Level entry Home Layout: One level Additional Comments: Patient states his sister stops by weekly to assist with some IADL tasks, patient does not have anyone available to help prn per his report  Lives With: Alone IADL History Homemaking Responsibilities: Yes Meal Prep Responsibility: Primary Laundry Responsibility: Primary Cleaning Responsibility: Primary Bill Paying/Finance Responsibility: Primary Shopping Responsibility: Primary Child Care Responsibility: No Current License: Yes Mode of Transportation: Car Occupation: Retired 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 Leisure and Hobbies: golfing, watch live sports Prior Function Level of Independence: Independent with basic ADLs, Independent with transfers, Independent with homemaking with ambulation, Independent with gait  Able to Take Stairs?: Reciprically ("not very well") Driving: Yes Comments: pt used a RW 2/2 back pain   ADL - See FIM  Vision/Perception  Vision- History Baseline Vision/History: Wears glasses Wears Glasses: At all times Patient Visual Report: No change from baseline Vision- Assessment Vision Assessment?: Yes Eye Alignment: Within Functional Limits Ocular Range of Motion: Within  Functional Limits Alignment/Gaze Preference: Within Defined Limits Tracking/Visual Pursuits: Able to track stimulus in all quads without difficulty Visual Fields: No apparent deficits   Cognition Overall Cognitive Status:  Within Functional Limits for tasks assessed Arousal/Alertness: Awake/alert Orientation Level: Oriented X4   Sensation Sensation Light Touch: Impaired by gross assessment (LUE) Stereognosis: Impaired by gross assessment (LUE) Hot/Cold: Impaired by gross assessment (LUE) Proprioception: Impaired by gross assessment (LUE) Coordination Gross Motor Movements are Fluid and Coordinated: Yes (decreased gross motor movements & coordination > left hand) Fine Motor Movements are Fluid and Coordinated: Yes (decreased fine motor movements & coordination > left hand)   Motor - See PT evaluation  Mobility - See PT evaluation  Trunk/Postural Assessment - See PT evaluation  Balance- See PT evaluation  Extremity/Trunk Assessment RUE Assessment RUE Assessment: Within Functional Limits LUE Assessment LUE Assessment: Exceptions to [facility] LUE AROM (degrees) LUE Overall AROM Comments: patient with decreased strengh in LUE causing AROM to be limited LUE PROM (degrees) LUE Overall PROM Comments: PROM is WFL LUE Strength LUE Overall Strength Comments: decreased strength throughout LUE. Shoulder grossly 2/5, elbow and wrist grossly 3+5  FIM:  FIM - Eating Eating Activity: 0: Activity did not occur FIM - Grooming Grooming: 0: Activity did not occur FIM - Bathing Bathing: 0: Activity did not occur FIM - Upper Body Dressing/Undressing Upper body dressing/undressing: 0: Wears gown/pajamas-no public clothing FIM - Lower Body Dressing/Undressing Lower body dressing/undressing: 0: Wears Interior and spatial designer FIM - Musician Devices: Grab bar or rail for support Toileting: 1: Two helpers (using STEDY to assist) FIM - Engineer, structural Devices: Bedside commode;Grab bars Toilet Transfers: 1-Two helpers;1-Mechanical lift FIM - Tub/Shower Transfers Tub/shower Transfers: 0-Activity did not occur or was simulated   Refer to Care Plan for Long Term Goals  Recommendations for other services: None at this time.   Discharge Criteria: Patient will be discharged from OT if patient refuses treatment 3 consecutive times without medical reason, if treatment goals not met, if there is a change in medical status, if patient makes no progress towards goals or if patient is discharged from hospital.  The above assessment, treatment plan, treatment alternatives and goals were discussed and mutually agreed upon: by patient  Reda Gettis 04/14/2014, 3:25 PM

## 2014-04-14 NOTE — Progress Notes (Signed)
Social Work Assessment and Plan  Patient Details  Name: [name] MRN: [id number] Date of Birth: 1950/10/23  Today's Date: 04/14/2014  Problem List:  Patient Active Problem List   Diagnosis Date Noted  . Acute left hemiparesis 04/13/2014  . ICH (intracerebral hemorrhage) 04/07/2014  . Bilateral carotid artery disease 03/04/2014  . Acute cor pulmonale 01/14/2014  . RVF (right ventricular failure) 01/14/2014  . Hypotension 10/23/2013  . Chronic combined systolic and diastolic heart failure 94/85/4627  . Back pain, spinal stenosis 09/30/2013  . Alcohol abuse 09/28/2013  . 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 Chronic anticoagulation 11/13/2012  . Long term (current) use of anticoagulants, Eliquis 10/23/2012  . Chronic a-fib- failed Amio-DCCV  10/23/2012  . Gout flare. 09/29/12, Lt knee, improved with colchicine. 09/30/2012  . Aortic stenosis, severe- Tissue AVR 5/14 09/23/2012  . CAD, CABG X 2 with SVG-PD/PL 5/14 09/23/2012  . Cellulitis, lower extremity- treated-May 2015 09/23/2012  . PAD (peripheral artery disease), decreased bil. ABIs 09/23/2012  . COPD on home oxygen 09/23/2012  . Acute respiratory failure, with BiPAP needed on admit 09/16/2012  . PAF - Maze at the time of his CABG/AVR 5/14. Recurrent a fib On Amiodarone, DCCV this admit with recurrent a fib, now persistent a fib 09/16/2012  . HTN (hypertension) 09/16/2012  . Hyperlipidemia 09/16/2012  . Noncompliance, no meds for two weeks prior to admission 09/16/2012  . Tobacco use 09/16/2012  . Acute combined systolic and diastolic CHF - EF  03-50% June 2015  09/16/2012  . Bilateral lower extremity edema, secondary to chronic CHF 09/16/2012   Past Medical History:  Past Medical History  Diagnosis Date  . Hypertension   . Gout   . Aortic stenosis     echo 06/19/08 with nomal LV function, moderate concentric LVH moderate aortic stenosis area 0.99 cm squared, peak gradient of 50 and mean of 31  .  Hyperlipidemia   . Atrial fibrillation with RVR, was in atrial fib in 04/2011 09/16/2012    a) s/p MAZE (09/2012)   . Tobacco use 09/16/2012  . Heart murmur   . Chronic combined systolic and diastolic CHF (congestive heart failure) 09/16/2012    a) ECHO (08/2013) EF 50-55%, grade II DD b) TEE ECHO (09/2013): 40-45%, bioprosthesis present, mild MR  . Aortic stenosis, severe 09/23/2012  . CAD (coronary artery disease), single vessel disease 09/23/2012    a) CABG (SVG to PDA and PL) wtih AVR (09/2012)   . PAD (peripheral artery disease), decreased bil. ABIs 09/23/2012    a) ABIs (11/2013): Right mild arterial insufficiency, L normal; aroto-bifem bypass graft: not well visualized, bilateral SFAs = to greater than 50% in dimaeter reduction   . Gout flare. 09/29/12, Lt knee, improved with colchicine. 09/30/2012  . H/O aortic valve replacement   . AS (aortic stenosis) with AVR with pericardial tissue valve  09/30/2013  . Back pain, spinal stenosis 09/30/2013  . COPD (chronic obstructive pulmonary disease)   . Bilateral carotid artery disease    Past Surgical History:  Past Surgical History  Procedure Laterality Date  . Iliac artery stent  10/20/08    stent to lt iliac  . Aorto bifem bypass  12/18/08    by Dr. Trula Slade  . Coronary artery bypass graft N/A 10/08/2012    Procedure: CORONARY ARTERY BYPASS GRAFTING (CABG);  Surgeon: Grace Isaac, MD;  Location: [place];  Service: Open Heart Surgery;  Laterality: N/A;  Coronary Artery bypass Graft times two utilizing the left greater  saphenous vein harvested endoscopically  . Aortic valve replacement N/A 10/08/2012    Procedure: AORTIC VALVE REPLACEMENT (AVR);  Surgeon: Grace Isaac, MD;  Location: [facility];  Service: Open Heart Surgery;  Laterality: N/A;  . Maze N/A 10/08/2012    Procedure: MAZE;  Surgeon: Grace Isaac, MD;  Location: [facility];  Service: Open Heart Surgery;  Laterality: N/A;  . Intraoperative transesophageal echocardiogram N/A 10/08/2012    Procedure:  INTRAOPERATIVE TRANSESOPHAGEAL ECHOCARDIOGRAM;  Surgeon: Grace Isaac, MD;  Location: [facility];  Service: Open Heart Surgery;  Laterality: N/A;  . Endovein harvest of greater saphenous vein Bilateral 10/08/2012    Procedure: ENDOVEIN HARVEST OF GREATER SAPHENOUS VEIN;  Surgeon: Grace Isaac, MD;  Location: [facility];  Service: Open Heart Surgery;  Laterality: Bilateral;  . US echocardiography  06/20/2011    mod concentric LVH,LA severely dilated,RA mildly dilated,mod. ca+ of the mitral apparatus,trace MR,mod. ca+ AOV w/stenosis.  Marland Kitchen Nm myocar perf wall motion  08/25/2008    normal  . Cardioversion N/A 02/25/2013    Procedure: CARDIOVERSION;  Surgeon: Sanda Klein, MD;  Location: [facility];  Service: Cardiovascular;  Laterality: N/A;  . Tee without cardioversion N/A 09/25/2013    Procedure: TRANSESOPHAGEAL ECHOCARDIOGRAM (TEE);  Surgeon: Thayer Headings, MD;  Location: [facility] [place];  Service: Cardiovascular;  Laterality: N/A;  . Cardioversion N/A 09/25/2013    Procedure: CARDIOVERSION;  Surgeon: Thayer Headings, MD;  [facility] ENDOSCOPY;  Service: Cardiovascular;  Laterality: N/A;  . Left and right heart catheterization with coronary angiogram N/A 09/20/2012    Procedure: LEFT AND RIGHT HEART CATHETERIZATION WITH CORONARY ANGIOGRAM;  Surgeon: Lorretta Harp, MD;  Location: [facility] CATH LAB;  Service: Cardiovascular;  Laterality: N/A;   Social History:  reports that he quit smoking about 19 months ago. His smoking use included Cigarettes. He has a 43 pack-year smoking history. He has never used smokeless tobacco. He reports that he drinks about 25.2 - 50.4 oz of alcohol per week. He reports that he does not use illicit drugs.  Family / Support Systems Marital Status: Divorced How Long?: 15 years Patient Roles: Parent (Dtr in [place], [place] and son at [facility].) Children: [name] - dtr - [phone] Other Supports: [name] - brother - [place]       [name] -  sister - [phone] Anticipated Caregiver: self, lives alone Ability/Limitations of Caregiver: Both children live out of town.  Pt's siblings are local, but unsure if they plan to assist with pt at home. Caregiver Availability: Other (Comment) (May need SNF after rehab due to limited caregiver availability.) Family Dynamics: Pt reports his dtr is very supportive and helps him with financial and health care decisions.  Social History Preferred language: English Religion: Christian Education: Forensic psychologist - education major at [employer] Read: Yes Write: Yes Employment Status: Retired Date Retired/Disabled/Unemployed: 2013 Age Retired: 60 Public relations account executive Issues: none reported Guardian/Conservator: none - dtr is Economist and POA   Abuse/Neglect Physical Abuse: Denies Verbal Abuse: Denies Sexual Abuse: Denies Exploitation of patient/patient's resources: Denies Self-Neglect: Denies  Emotional Status Pt's affect, behavior and adjustment status: During assessment, pt was upbeat and positive about his condition and his rehab.  He understands how people with chronic conditions could become depressed or anxious, but he has not experienced that. Recent Psychosocial Issues: None reported Psychiatric History: None reported Substance Abuse History: None reported  Patient / Family Perceptions, Expectations & Goals Pt/Family understanding of illness & functional limitations: Pt reports an  understanding of his condition. Premorbid pt/family roles/activities: Pt admits that he wasn't able to do much even prior to admission due to back pain.  MD was going to try an injection next week, but now pt is hospitalized.  Pt enjoys reading, but if possible, he's be out traveling and playing golf. Anticipated changes in roles/activities/participation: None at this time Pt/family expectations/goals: Pt wants to get stronger through his work with the therapy team.  [employer]: None Premorbid Home Care/DME Agencies: Other (Comment) (Pt receives O2 at home.  CSW to determine provider.) Transportation available at discharge: family Resource referrals recommended: Support group (specify)  Discharge Planning Living Arrangements: Spouse/significant other Support Systems: Children, Other relatives, Friends/neighbors Type of Residence: Private residence Insurance Resources: Multimedia programmer (specify) Nurse, mental health and then [facility] starting 04-24-14) Financial Resources: Social Security Living Expenses: Rent Money Management: Patient, Family Does the patient have any problems obtaining your medications?: No Home Management: Pt and his family take care of this. Patient/Family Preliminary Plans: Pt plans to take things as they come and reported his dtr is working on aftercare for him.  CSW left dtr a message to see what plans she may be working on for pt.  Pt has been to [facility] in summer 2014 and would be open to going back there, if necesary, but prefers to go home. Barriers to Discharge: Self care Social Work Anticipated Follow Up Needs: HH/OP, Support Group Expected length of stay: 18-24 days  Clinical Impression CSW met with pt to introduce self and role of CSW, as well as complete assessment.  Pt was very friendly with CSW and reported that rehab was going well so far.  Pt has a supportive daughter and son who both live out of town.  Pt had another son who was killed while serving in [place].  Pt was positive and upbeat about his rehab.  Pt's chronic back pain has been limiting him for years and he has not been able to do the things he would like to do, such as play golf or travel.  Pt hopes to do these things someday.  Pt is on continuous oxygen at home and was not able to get out of the house to participate with cardiac rehab last summer.  Per pt, his dtr is working on some plans for him at d/c.  [place] let dtr a message and hope to f/u with her  soon.  CSW will continue to follow and assist as needed.  Leili Eskenazi, Silvestre Mesi 04/14/2014, 11:48 AM

## 2014-04-14 NOTE — Progress Notes (Signed)
Patient information reviewed and entered into eRehab system by Dawnell Bryant, RN, CRRN, PPS Coordinator.  Information including medical coding and functional independence measure will be reviewed and updated through discharge.    

## 2014-04-14 NOTE — Progress Notes (Signed)
[facility] Admission Coordinator Signed Physical Medicine and Rehabilitation PMR Pre-admission 04/09/2014 2:45 PM  Related encounter: ED to Hosp-Admission (Discharged) from 04/07/2014 in [place] [facility] All   PMR Admission Coordinator Pre-Admission Assessment  Patient: [name] is an 63 y.o., male MRN: [id number] DOB: November 03, 1950 Height: _0  (177.8 cm) Weight: 103.4 kg (227 lb 15.3 oz)  Insurance Information HMO: PPO: Yes PCP: IPA: 80/20: OTHER: Group ladvtc PRIMARY: BC[employer] [place] PPO Policy#: [id number] Subscriber: Elyse Hsu Approval given on 04-10-14 from Doroteo Glassman, RN from 04-11-14 through 04-26-14. Updates due to [place] on 04-26-14. CM Name: Doroteo Glassman, RN Phone#: [phone], ext. [phone] Fax#: [phone] Pre-Cert#: [id number] Employer: Retired Benefits: Phone #: [phone] Name: online Eff. Date: 11-22-13 Deduct: $5500 (met all) Out of Pocket Max: $5500 (met all) Life Max: none CIR: 100% SNF: 100%, 60 Day visit max Outpatient: 100% Co-Pay: none, 30 day visit limit Home Health: 100% Co-Pay: none, 30 day visit limit DME: 100% Co-Pay: none Providers: in network  Note: that both deductible and OOP Max have been met, thus all eligible services are now covered at 100%.   Emergency Contact Information Contact Information    Name Relation Home Work [place] Daughter [phone]     Mikiah, Demond [phone]  [phone]   Danella Deis [phone]  [phone]     Current Medical History  Patient Admitting Diagnosis: R BG/IC hemorrhage  History of Present Illness: A 62 y.o. right handed male history  of hypertension, atrial fibrillation maintained on Eliquis status post MAZE June 2014. Independent prior to admission. Presented 04/07/2014 with left-sided weakness and mild slurred speech. Cranial CT scan showed a right IC/ basal ganglia hemorrhage with a 4 mm right to left midline shift. Blood pressure 158/85.Eliquis discontinued in light of intracranial hemorrhage. The patient did not receive TPA. Close monitoring of blood pressure. Recent carotid Dopplers 02/25/2014 revealed 70% left ICA and 50-69% right ICA stenosis. Follow-up cranial CT scan 04/08/2014 pending. Patient is tolerating a regular consistency diet. Physical therapy evaluation completed 04/08/2014 with recommendations of physical medicine rehabilitation consultation.    Total: 5=NIH  Past Medical History  Past Medical History  Diagnosis Date  . Hypertension   . Gout   . Aortic stenosis     echo 06/19/08 with nomal LV function, moderate concentric LVH moderate aortic stenosis area 0.99 cm squared, peak gradient of 50 and mean of 31  . Hyperlipidemia   . Atrial fibrillation with RVR, was in atrial fib in 04/2011 09/16/2012    a) s/p MAZE (09/2012)   . Tobacco use 09/16/2012  . Heart murmur   . Chronic combined systolic and diastolic CHF (congestive heart failure) 09/16/2012    a) ECHO (08/2013) EF 50-55%, grade II DD b) TEE ECHO (09/2013): 40-45%, bioprosthesis present, mild MR  . Aortic stenosis, severe 09/23/2012  . CAD (coronary artery disease), single vessel disease 09/23/2012    a) CABG (SVG to PDA and PL) wtih AVR (09/2012)   . PAD (peripheral artery disease), decreased bil. ABIs 09/23/2012    a) ABIs (11/2013): Right mild arterial insufficiency, L normal; aroto-bifem bypass graft: not well visualized, bilateral SFAs = to greater than 50% in dimaeter reduction   . Gout flare. 09/29/12, Lt knee, improved with  colchicine. 09/30/2012  . H/O aortic valve replacement   . AS (aortic stenosis) with AVR with pericardial tissue valve  09/30/2013  . Back pain, spinal stenosis 09/30/2013  .  COPD (chronic obstructive pulmonary disease)   . Bilateral carotid artery disease     Family History  family history includes Heart attack in his brother, brother, and father; Heart disease in his brother.  Prior Rehab/Hospitalizations: Had cardiac rehab last summer. Was at [facility] for 2-3 weeks.  Current Medications  Current facility-administered medications: 0.9 % sodium chloride infusion, , Intravenous, Continuous, Kathrynn Ducking, MD, Last Rate: 50 mL/hr at 04/10/14 1647; acetaminophen (TYLENOL) tablet 650 mg, 650 mg, Oral, Q4H PRN, 650 mg at 04/09/14 1054 **OR** acetaminophen (TYLENOL) suppository 650 mg, 650 mg, Rectal, Q4H PRN, Marliss Coots, PA-C antiseptic oral rinse (CPC / CETYLPYRIDINIUM CHLORIDE 0.05%) solution 7 mL, 7 mL, Mouth Rinse, BID, Antony Contras, MD, 7 mL at 04/13/14 0947; atorvastatin (LIPITOR) tablet 80 mg, 80 mg, Oral, q1800, Donzetta Starch, NP, 80 mg at 04/12/14 1721; budesonide-formoterol (SYMBICORT) 80-4.5 MCG/ACT inhaler 2 puff, 2 puff, Inhalation, BID, Marliss Coots, PA-C, 2 puff at 04/13/14 0754 carvedilol (COREG) tablet 12.5 mg, 12.5 mg, Oral, BID WC, Donzetta Starch, NP, 12.5 mg at 04/13/14 1505; colchicine tablet 0.6 mg, 0.6 mg, Oral, BID, Kathrynn Ducking, MD, 0.6 mg at 04/13/14 6979; digoxin (LANOXIN) tablet 0.25 mg, 0.25 mg, Oral, CAMRY, THEISS, PA-C, 0.25 mg at 04/13/14 4801; HYDROmorphone (DILAUDID) tablet 2 mg, 2 mg, Oral, Q3H PRN, Kathrynn Ducking, MD, 2 mg at 04/09/14 1250 labetalol (NORMODYNE,TRANDATE) injection 10-40 mg, 10-40 mg, Intravenous, Q10 min PRN, Marliss Coots, PA-C, 20 mg at 04/07/14 2234; metolazone (ZAROXOLYN) tablet 2.5 mg, 2.5 mg, Oral, Q M,W,F, Donzetta Starch, NP, 2.5 mg at 04/13/14 0946; pantoprazole (PROTONIX) EC tablet 40 mg,  40 mg, Oral, Daily, Antony Contras, MD, 40 mg at 04/12/14 2217 polyethylene glycol (MIRALAX / GLYCOLAX) packet 17 g, 17 g, Oral, Daily, Kathrynn Ducking, MD, 17 g at 04/13/14 0947; potassium chloride SA (K-DUR,KLOR-CON) CR tablet 20 mEq, 20 mEq, Oral, Daily, Donzetta Starch, NP, 20 mEq at 04/13/14 0946; RESOURCE THICKENUP CLEAR, , Oral, PRN, Antony Contras, MD; senna-docusate (Senokot-S) tablet 1 tablet, 1 tablet, Oral, BID, Marliss Coots, PA-C, 1 tablet at 04/13/14 6553 spironolactone (ALDACTONE) tablet 25 mg, 25 mg, Oral, Daily, Donzetta Starch, NP, 25 mg at 04/13/14 0946; tiZANidine (ZANAFLEX) tablet 8 mg, 8 mg, Oral, BID, Donzetta Starch, NP, 8 mg at 04/13/14 0946; torsemide (DEMADEX) tablet 20 mg, 20 mg, Oral, q1800, Antony Contras, MD, 20 mg at 04/12/14 1721; torsemide (DEMADEX) tablet 40 mg, 40 mg, Oral, QAC breakfast, Antony Contras, MD, 40 mg at 04/13/14 7482  Patients Current Diet: DIET DYS 3  Precautions / Restrictions Precautions Precautions: Fall Restrictions Weight Bearing Restrictions: No   Prior Activity Level Community (5-7x/wk): Goes out daily. Has back problems. Goes to MD appts a lot. Used a RW PTA.   Home Assistive Devices / Equipment Home Assistive Devices/Equipment: Environmental consultant (specify type)  Prior Functional Level Prior Function Level of Independence: Independent with assistive device(s) Comments: pt used a RW 2/2 back pain  Current Functional Level Cognition  Arousal/Alertness: Awake/alert Overall Cognitive Status: Within Functional Limits for tasks assessed Orientation Level: Oriented X4 Attention: Sustained Sustained Attention: Appears intact Memory: Impaired Memory Impairment: Retrieval deficit, Decreased recall of new information Awareness: Appears intact Problem Solving: Appears intact Safety/Judgment: Appears intact (will further assess)   Extremity Assessment (includes Sensation/Coordination)  Upper Extremity Assessment: Defer to OT evaluation Lower  Extremity Assessment: LLE deficits/detail LLE Deficits / Details: pt able to actively move LE with strength 2/5.  pt with diminished soft touch and proprioception.  Cervical / Trunk Assessment: Kyphotic    ADLs  Overall ADL's : Needs assistance/impaired Eating/Feeding: Set up, Bed level, Sitting Grooming: Wash/dry hands, Wash/dry face, Oral care, Brushing hair, Min guard, Sitting Grooming Details (indicate cue type and reason): sitting EOB  Upper Body Bathing: Moderate assistance, Sitting Lower Body Bathing: Maximal assistance, Bed level Upper Body Dressing : Maximal assistance, Sitting Lower Body Dressing: Total assistance, Bed level Toilet Transfer: Total assistance Toilet Transfer Details (indicate cue type and reason): did not attempt due to pt fatigued Toileting- Clothing Manipulation and Hygiene: Total assistance, Bed level General ADL Comments: Pt is very motivated. he was able to perofrm simple grooming tasks EOB with min guard assist and cues for trunk control and balance    Mobility  Overal bed mobility: Needs Assistance, +2 for physical assistance Bed Mobility: Rolling Rolling: Mod assist, +2 for physical assistance Supine to sit: Mod assist Sit to supine: Mod assist General bed mobility comments: pt able to roll to L side with only ModA x1, but requires 2nd person for rolling to R side.     Transfers  Overall transfer level: Needs assistance Equipment used: 2 person hand held assist Transfers: Sit to/from Stand Sit to Stand: Max assist, +2 physical assistance Stand pivot transfers: Total assist, +2 physical assistance General transfer comment: Utilized Maxisk[place] for OOB to recliner and trialed standing from recliner. pt having difficulty coming to full standing and remains very flexed today. pt doing better at attending to balance and attempts to correct L lateral lean.     Ambulation / Gait / Stairs / Wheelchair Mobility    Not assessed at this time.  Anticipate needs.   Posture / Balance Dynamic Sitting Balance Sitting balance - Comments: pt is able to utilize R UE for reaching tasks and can maintain balance for brief periods of time. pt able to work on lateral weight shifting in sitting with propping on R elbow and L elbow. pt demos improvement in pt's ability to maintain balance and move trunk.     Special needs/care consideration BiPAP/CPAP No CPM No Continuous Drip IV 0.9% NS 75 ml/hr Dialysis No  Life Vest No Oxygen Yes, Currently on 2L by [place]. On O2 1 L [place] resting at home and 2 L when up ambulating Special Bed No Trach Size No Wound Vac (area) No  Skin Has a scab on left knee, dressing left leg where leg is "weeping", bruises on arms Bowel mgmt: Last BM 04/07/14 Bladder mgmt: Voiding in urinal Diabetic mgmt No    Previous Home Environment Living Arrangements: Alone Lives With: Alone Available Help at Discharge: Family, Available PRN/intermittently Type of Home: [place]: Yes Type of Home Care Services: Other (Comment) (O2 supply)  Discharge Living Setting Plans for Discharge Living Setting: Alone, Apartment Type of Home at Discharge: Apartment Discharge Home Layout: One level Discharge Home Access: Level entry Does the patient have any problems obtaining your medications?: No  Social/Family/Support Systems Patient Roles: Parent (Divorced. Has a son and a Dtr out of town.) Contact Information: Gwynneth Aliment - daughter [phone] Anticipated Caregiver: self, lives alone Ability/Limitations of Caregiver: Dtr in [place] and son in [place] Caregiver Availability: Other (Comment) (May need SNF after rehab due to limited caregiver support.) Discharge Plan Discussed with Primary Caregiver: Yes Is Caregiver In Agreement with Plan?: Yes Does Caregiver/Family have Issues with Lodging/Transportation while Pt is in Rehab?: No  Goals/Additional Needs Patient/Family Goal for  Rehab: PT/OT S/Min  assist, ST S/Mod I goals Expected length of stay: 18-24 days Cultural Considerations: None Dietary Needs: Dys 3, nectar thick liquids Equipment Needs: TBD Pt/Family Agrees to Admission and willing to participate: Yes Program Orientation Provided & Reviewed with Pt/Caregiver Including Roles & Responsibilities: Yes  Decrease burden of Care through IP rehab admission: N/A  Possible need for SNF placement upon discharge: Yes, limited caregiver support. May need SNF after rehab if he cannot progress sufficiently for discharge home.  Patient Condition: This patient's medical and functional status has changed since the consult dated: 04-09-14 in which the Rehabilitation Physician determined and documented that the patient's condition is appropriate for intensive rehabilitative care in an inpatient rehabilitation facility. See "History of Present Illness" (above) for medical update. Functional changes are: maximal assistance of 2 for limited sit to stand and stand pivot transfers and moderate to total assistance with self care skills. Patient's medical and functional status update has been discussed with the Rehabilitation physician and patient remains appropriate for inpatient rehabilitation. Will admit to inpatient rehab today.  Preadmission Screen Completed By: Nanetta Batty, PT 04/13/2014 11:31 AM ______________________________________________________________________  Discussed status with Dr. Letta Pate on 04-13-14 at 1059 and received telephone approval for admission today.  Admission Coordinator: Nanetta Batty, PT, time 1059/Date 04-13-14          Cosigned by: Charlett Blake, MD at 04/13/2014 11:54 AM  Revision History     Date/Time User Provider Type Action   04/13/2014 11:54 AM Charlett Blake, MD Physician Cosign   04/13/2014 11:33 AM [facility] Admission Coordinator Sign   04/13/2014 11:02 AM [facility]  Admission Coordinator Share   04/13/2014 11:01 AM [facility] Admission Coordinator Share   04/09/2014 2:55 PM Retta Diones, RN Rehab Admission Coordinator Share   04/09/2014 2:49 PM Retta Diones, RN Rehab Admission Coordinator Share   View Details Report

## 2014-04-14 NOTE — Progress Notes (Addendum)
[facility] Individual Statement of Services  Patient Name:  [name]  Date:  04/14/2014  Welcome to the [facility].  Our goal is to provide you with an individualized program based on your diagnosis and situation, designed to meet your specific needs.  With this comprehensive rehabilitation program, you will be expected to participate in at least 3 hours of rehabilitation therapies Monday-Friday, with modified therapy programming on the weekends.  Your rehabilitation program will include the following services:  Physical Therapy (PT), Occupational Therapy (OT), Speech Therapy (ST), 24 hour per day rehabilitation nursing, Case Management (Social Worker), Rehabilitation Medicine, Nutrition Services and Pharmacy Services  Weekly team conferences will be held on Wednesdays to discuss your progress.  Your Social Worker will talk with you frequently to get your input and to update you on team discussions.  Team conferences with you and your family in attendance may also be held.  Expected length of stay: 18 to 21 days  Overall anticipated outcome: Supervision level from a wheelchair  Depending on your progress and recovery, your program may change. Your Social Worker will coordinate services and will keep you informed of any changes. Your Social Worker's name and contact numbers are listed  below.  The following services may also be recommended but are not provided by the [facility] will be made to provide these services after discharge if needed.  Arrangements include referral to agencies that provide these services.  Your insurance has been verified to be:  [employer] Your primary doctor is:  Dr. Merrilee Seashore  Pertinent information will be shared with your doctor and your insurance company.  Social Worker:   Alfonse Alpers, LCSW  [phone] or [phone]  Information discussed with and copy given to patient by: Trey Sailors, 04/14/2014, 11:10 AM

## 2014-04-14 NOTE — Progress Notes (Signed)
Meredith Staggers, MD Physician Signed Physical Medicine and Rehabilitation Consult Note 04/08/2014 2:52 PM  Related encounter: ED to Hosp-Admission (Discharged) from 04/07/2014 in [facility] Collapse All        Physical Medicine and Rehabilitation Consult Reason for Consult: Right basal ganglia intracranial hemorrhage Referring Physician: Dr. Leonie Man   HPI: [name] is a 63 y.o. right handed male history of hypertension, atrial fibrillation maintained on Eliquis status post MAZE June 2014. Independent prior to admission. Presented 04/07/2014 with left-sided weakness and mild slurred speech .  . Cranial CT scan showed a right IC basal ganglia hemorrhage with a 4 mm right to left midline shift. Blood pressure 158/85.Eliquis discontinued in light of intracranial hemorrhage. The patient did not receive TPA. Close monitoring of blood pressure. Recent carotid Dopplers 02/25/2014 revealed 70% left ICA and 50-69% right ICA stenosis. Follow-up cranial CT scan 04/08/2014 pending. Patient is tolerating a regular consistency diet. Physical therapy evaluation completed 04/08/2014 with recommendations of physical medicine rehabilitation consultation.     Review of Systems  Cardiovascular: Positive for palpitations.  Gastrointestinal: Positive for constipation.  Musculoskeletal: Positive for myalgias and back pain.  All other systems reviewed and are negative.  Past Medical History  Diagnosis Date  . Hypertension   . Gout   . Aortic stenosis     echo 06/19/08 with nomal LV function, moderate concentric LVH moderate aortic stenosis area 0.99 cm squared, peak gradient of 50 and mean of 31  . Hyperlipidemia   . Atrial fibrillation with RVR, was in atrial fib in 04/2011 09/16/2012      a) s/p MAZE (09/2012)   . Tobacco use 09/16/2012  . Heart murmur   . Chronic combined systolic and diastolic CHF (congestive heart failure) 09/16/2012    a) ECHO (08/2013) EF 50-55%, grade II DD b) TEE ECHO (09/2013): 40-45%, bioprosthesis present, mild MR  . Aortic stenosis, severe 09/23/2012  . CAD (coronary artery disease), single vessel disease 09/23/2012    a) CABG (SVG to PDA and PL) wtih AVR (09/2012)   . PAD (peripheral artery disease), decreased bil. ABIs 09/23/2012    a) ABIs (11/2013): Right mild arterial insufficiency, L normal; aroto-bifem bypass graft: not well visualized, bilateral SFAs = to greater than 50% in dimaeter reduction   . Gout flare. 09/29/12, Lt knee, improved with colchicine. 09/30/2012  . H/O aortic valve replacement   . AS (aortic stenosis) with AVR with pericardial tissue valve  09/30/2013  . Back pain, spinal stenosis 09/30/2013  . COPD (chronic obstructive pulmonary disease)   . Bilateral carotid artery disease    Past Surgical History  Procedure Laterality Date  . Iliac artery stent  10/20/08    stent to lt iliac  . Aorto bifem bypass  12/18/08    by Dr. Trula Slade  . Coronary artery bypass graft N/A 10/08/2012    Procedure: CORONARY ARTERY BYPASS GRAFTING (CABG); Surgeon: Grace Isaac, MD; Location: [facility]; Service: Open Heart Surgery; Laterality: N/A; Coronary Artery bypass Graft times two utilizing the left greater saphenous vein harvested endoscopically  . Aortic valve replacement N/A 10/08/2012    Procedure: AORTIC VALVE REPLACEMENT (AVR); Surgeon: Grace Isaac, MD; Location: [facility]; Service: Open Heart Surgery; Laterality: N/A;  . Maze N/A 10/08/2012    Procedure: MAZE; Surgeon: Grace Isaac, MD; Location: [facility]; Service: Open Heart Surgery; Laterality: N/A;  . Intraoperative transesophageal echocardiogram N/A 10/08/2012    Procedure: INTRAOPERATIVE  TRANSESOPHAGEAL ECHOCARDIOGRAM; Surgeon: Grace Isaac, MD; Location: [facility]; Service: Open Heart Surgery; Laterality: N/A;  . Endovein harvest of greater saphenous vein Bilateral 10/08/2012    Procedure: ENDOVEIN HARVEST OF GREATER SAPHENOUS VEIN; Surgeon: Grace Isaac, MD; Location: [facility]; Service: Open Heart Surgery; Laterality: Bilateral;  . US echocardiography  06/20/2011    mod concentric LVH,LA severely dilated,RA mildly dilated,mod. ca+ of the mitral apparatus,trace MR,mod. ca+ AOV w/stenosis.  Marland Kitchen Nm myocar perf wall motion  08/25/2008    normal  . Cardioversion N/A 02/25/2013    Procedure: CARDIOVERSION; Surgeon: Sanda Klein, MD; Location: [place]; Service: Cardiovascular; Laterality: N/A;  . Tee without cardioversion N/A 09/25/2013    Procedure: TRANSESOPHAGEAL ECHOCARDIOGRAM (TEE); Surgeon: Thayer Headings, MD; Location: [facility]; Service: Cardiovascular; Laterality: N/A;  . Cardioversion N/A 09/25/2013    Procedure: CARDIOVERSION; Surgeon: Thayer Headings, MD; Location: [facility] ENDOSCOPY; Service: Cardiovascular; Laterality: N/A;  . Left and right heart catheterization with coronary angiogram N/A 09/20/2012    Procedure: LEFT AND RIGHT HEART CATHETERIZATION WITH CORONARY ANGIOGRAM; Surgeon: Lorretta Harp, MD; Location: [facility] CATH LAB; Service: Cardiovascular; Laterality: N/A;   Family History  Problem Relation Age of Onset  . Heart attack Father     died at 28 with MI  . Heart disease Brother   . Heart attack Brother   . Heart attack Brother    Social History:  reports that he quit smoking about 18 months ago. His smoking use included Cigarettes. He has a 43 pack-year smoking history. He has never used smokeless tobacco. He reports that he drinks about 25.2 - 50.4 oz of alcohol per week. He reports that he does not use illicit drugs. Allergies: No Known Allergies Medications  Prior to Admission  Medication Sig Dispense Refill  . albuterol (PROVENTIL HFA;VENTOLIN HFA) 108 (90 BASE) MCG/ACT inhaler Inhale 2 puffs into the lungs every 6 (six) hours as needed for wheezing or shortness of breath. 1 Inhaler 2  . apixaban (ELIQUIS) 5 MG TABS tablet Take 1 tablet (5 mg total) by mouth 2 (two) times daily. 180 tablet 3  . atorvastatin (LIPITOR) 80 MG tablet Take 1 tablet (80 mg total) by mouth daily at 6 PM. 30 tablet 6  . budesonide-formoterol (SYMBICORT) 80-4.5 MCG/ACT inhaler Inhale 2 puffs into the lungs 2 (two) times daily. 1 Inhaler 12  . carvedilol (COREG) 12.5 MG tablet Take 2 tablets (25 mg total) by mouth 2 (two) times daily with a meal. (Patient taking differently: Take 12.5 mg by mouth 2 (two) times daily. ) 30 tablet 3  . digoxin (LANOXIN) 0.25 MG tablet Take 1 tablet (0.25 mg total) by mouth every other day. 30 tablet 6  . folic acid (FOLVITE) 1 MG tablet Take 1 tablet (1 mg total) by mouth daily. 30 tablet 6  . gabapentin (NEURONTIN) 300 MG capsule Take 300 mg by mouth 3 (three) times daily.    . metolazone (ZAROXOLYN) 2.5 MG tablet Take 2.5 mg by mouth every Monday, Wednesday, and Friday.     . OXYGEN-HELIUM IN Inhale 1-2 L into the lungs continuous. Is in the habit of not using his oxygen when he leaves the house. Uses 1L continuous normally Uses 2L for exertion    . potassium chloride SA (K-DUR,KLOR-CON) 20 MEQ tablet Take 1 tablet (20 mEq total) by mouth daily. 60 tablet 6  . spironolactone (ALDACTONE) 25 MG tablet Take 1 tablet (25 mg total) by mouth daily. 30 tablet 3  . tiZANidine (ZANAFLEX) 4 MG  tablet Take 8 mg by mouth 2 (two) times daily.    Marland Kitchen torsemide (DEMADEX) 20 MG tablet Take 40 mg (2 tabs) in the morning and 20 mg (1 tab) in the evening (Patient taking differently: Take 20-40 mg by mouth 2 (two) times daily. Take 40 mg (2 tabs) in the morning and 20 mg (1 tab) in the evening)  90 tablet 3    Home: Home Living Family/patient expects to be discharged to:: Inpatient rehab Living Arrangements: Alone Available Help at Discharge: Family, Available PRN/intermittently Type of Home: Apartment Lives With: Alone  Functional History: Prior Function Level of Independence: Independent with assistive device(s) Comments: pt used a RW 2/2 back pain Functional Status:  Mobility: Bed Mobility Overal bed mobility: Needs Assistance, +2 for physical assistance Bed Mobility: Supine to Sit Supine to sit: Mod assist, HOB elevated General bed mobility comments: pt attempts to A with mobility and is able to use momentum to A with bringing trunk up to sitting.  Transfers Overall transfer level: Needs assistance Equipment used: 2 person hand held assist Transfers: Sit to/from Stand, Stand Pivot Transfers Sit to Stand: Max assist, +2 physical assistance Stand pivot transfers: Total assist, +2 physical assistance General transfer comment: pt with very strong L sided lean when coming to stand. pt with poor proprioception and awareness of midline. Max facilitation and cueing to A with bringing pt over center of balance and step-by-step through pivot towards R side to recliner.       ADL:    Cognition: Cognition Overall Cognitive Status: Impaired/Different from baseline Arousal/Alertness: Awake/alert Orientation Level: Oriented X4 Attention: Sustained Sustained Attention: Appears intact Memory: Impaired Memory Impairment: Retrieval deficit, Decreased recall of new information Awareness: Appears intact Problem Solving: Appears intact Safety/Judgment: Appears intact (will further assess) Cognition Arousal/Alertness: Awake/alert Behavior During Therapy: WFL for tasks assessed/performed Overall Cognitive Status: Impaired/Different from baseline  Blood pressure 123/74, pulse 102, temperature 98.8 F (37.1 C), temperature source Oral, resp. rate 19, height 5\' 10"   (1.778 m), weight 103.4 kg (227 lb 15.3 oz), SpO2 96 %. Physical Exam  Constitutional: He is oriented to person, place, and time.  HENT:  Head: Normocephalic.  Eyes: EOM are normal.  Neck: Normal range of motion. Neck supple. No thyromegaly present.  Cardiovascular:  Cardiac rate controlled  Respiratory: Effort normal and breath sounds normal. No respiratory distress.  GI: Soft. Bowel sounds are normal. He exhibits no distension. There is no tenderness.  Neurological: He is alert and oriented to person, place, and time.  Makes good eye contact with examiner. He does exhibit some subtle word processing difficulty.Follows commands. LUE and LLE with tr/2 sensation. LUE: 1-2/5. LLE: tr to 1/5 hip,knee, ankle. Left facial droop and tongue deviation  Skin: Skin is warm and dry.     Lab Results Last 24 Hours    Results for orders placed or performed during the hospital encounter of 04/07/14 (from the past 24 hour(s))  Ethanol Status: None   Collection Time: 04/07/14 4:07 PM  Result Value Ref Range   Alcohol, Ethyl (B) <11 0 - 11 mg/dL  Protime-INR Status: None   Collection Time: 04/07/14 4:07 PM  Result Value Ref Range   Prothrombin Time 14.9 11.6 - 15.2 seconds   INR 1.15 0.00 - 1.49  APTT Status: None   Collection Time: 04/07/14 4:07 PM  Result Value Ref Range   aPTT 34 24 - 37 seconds  CBC Status: Abnormal   Collection Time: 04/07/14 4:07 PM  Result Value Ref Range  WBC 6.9 4.0 - 10.5 K/uL   RBC 4.11 (L) 4.22 - 5.81 MIL/uL   Hemoglobin 10.9 (L) 13.0 - 17.0 g/dL   HCT 35.7 (L) 39.0 - 52.0 %   MCV 86.9 78.0 - 100.0 fL   MCH 26.5 26.0 - 34.0 pg   MCHC 30.5 30.0 - 36.0 g/dL   RDW 16.7 (H) 11.5 - 15.5 %   Platelets 215 150 - 400 K/uL  Differential Status: None   Collection Time: 04/07/14 4:07 PM  Result Value Ref Range   Neutrophils Relative % 51 43 - 77 %    Neutro Abs 3.6 1.7 - 7.7 K/uL   Lymphocytes Relative 35 12 - 46 %   Lymphs Abs 2.4 0.7 - 4.0 K/uL   Monocytes Relative 11 3 - 12 %   Monocytes Absolute 0.7 0.1 - 1.0 K/uL   Eosinophils Relative 2 0 - 5 %   Eosinophils Absolute 0.1 0.0 - 0.7 K/uL   Basophils Relative 1 0 - 1 %   Basophils Absolute 0.0 0.0 - 0.1 K/uL  Comprehensive metabolic panel Status: Abnormal   Collection Time: 04/07/14 4:07 PM  Result Value Ref Range   Sodium 141 137 - 147 mEq/L   Potassium 3.8 3.7 - 5.3 mEq/L   Chloride 100 96 - 112 mEq/L   CO2 29 19 - 32 mEq/L   Glucose, Bld 99 70 - 99 mg/dL   BUN 30 (H) 6 - 23 mg/dL   Creatinine, Ser 1.29 0.50 - 1.35 mg/dL   Calcium 9.1 8.4 - 10.5 mg/dL   Total Protein 7.8 6.0 - 8.3 g/dL   Albumin 3.3 (L) 3.5 - 5.2 g/dL   AST 33 0 - 37 U/L   ALT 21 0 - 53 U/L   Alkaline Phosphatase 78 39 - 117 U/L   Total Bilirubin 0.4 0.3 - 1.2 mg/dL   GFR calc non Af Amer 57 (L) >90 mL/min   GFR calc Af Amer 66 (L) >90 mL/min   Anion gap 12 5 - 15  Heparin level - if patient on rivaroxaban (XARELTO) or apixaban Arne [place]) Status: Abnormal   Collection Time: 04/07/14 4:07 PM  Result Value Ref Range   Heparin Unfractionated >2.20 (H) 0.30 - 0.70 IU/mL  I-Stat Troponin, ED (not at [facility]) Status: None   Collection Time: 04/07/14 4:17 PM  Result Value Ref Range   Troponin i, poc 0.01 0.00 - 0.08 ng/mL   Comment 3     I-Stat Chem 8, ED Status: Abnormal   Collection Time: 04/07/14 4:19 PM  Result Value Ref Range   Sodium 141 137 - 147 mEq/L   Potassium 3.5 (L) 3.7 - 5.3 mEq/L   Chloride 100 96 - 112 mEq/L   BUN 30 (H) 6 - 23 mg/dL   Creatinine, Ser 1.50 (H) 0.50 - 1.35 mg/dL   Glucose, Bld 103 (H) 70 - 99 mg/dL   Calcium, Ion 1.11 (L) 1.13 - 1.30 mmol/L   TCO2 27 0 - 100 mmol/L   Hemoglobin  12.6 (L) 13.0 - 17.0 g/dL   HCT 37.0 (L) 39.0 - 52.0 %  Digoxin level Status: Abnormal   Collection Time: 04/07/14 4:25 PM  Result Value Ref Range   Digoxin Level 0.7 (L) 0.8 - 2.0 ng/mL  Urine Drug Screen Status: None   Collection Time: 04/07/14 6:03 PM  Result Value Ref Range   Opiates NONE DETECTED NONE DETECTED   Cocaine NONE DETECTED NONE DETECTED   Benzodiazepines NONE DETECTED NONE DETECTED  Amphetamines NONE DETECTED NONE DETECTED   Tetrahydrocannabinol NONE DETECTED NONE DETECTED   Barbiturates NONE DETECTED NONE DETECTED  Urinalysis, Routine w reflex microscopic Status: Abnormal   Collection Time: 04/07/14 6:03 PM  Result Value Ref Range   Color, Urine YELLOW YELLOW   APPearance CLEAR CLEAR   Specific Gravity, Urine 1.009 1.005 - 1.030   pH 5.5 5.0 - 8.0   Glucose, UA NEGATIVE NEGATIVE mg/dL   Hgb urine dipstick TRACE (A) NEGATIVE   Bilirubin Urine NEGATIVE NEGATIVE   Ketones, ur NEGATIVE NEGATIVE mg/dL   Protein, ur 30 (A) NEGATIVE mg/dL   Urobilinogen, UA 0.2 0.0 - 1.0 mg/dL   Nitrite NEGATIVE NEGATIVE   Leukocytes, UA NEGATIVE NEGATIVE  Urine microscopic-add on Status: None   Collection Time: 04/07/14 6:03 PM  Result Value Ref Range   Squamous Epithelial / LPF RARE RARE   WBC, UA 0-2 <3 WBC/hpf   RBC / HPF 0-2 <3 RBC/hpf   Bacteria, UA RARE RARE  MRSA PCR Screening Status: None   Collection Time: 04/07/14 9:40 PM  Result Value Ref Range   MRSA by PCR NEGATIVE NEGATIVE  Glucose, capillary Status: Abnormal   Collection Time: 04/07/14 10:24 PM  Result Value Ref Range   Glucose-Capillary 106 (H) 70 - 99 mg/dL  Glucose, capillary Status: Abnormal   Collection Time: 04/08/14 8:11 AM  Result Value Ref Range   Glucose-Capillary 136 (H) 70 - 99 mg/dL   Comment 1 Notify RN     Comment 2 Documented in Chart   Glucose, capillary Status: Abnormal   Collection Time: 04/08/14 11:52 AM  Result Value Ref Range   Glucose-Capillary 192 (H) 70 - 99 mg/dL   Comment 1 Notify RN    Comment 2 Documented in Chart       Imaging Results (Last 48 hours)    Ct Head Wo Contrast  04/07/2014 CLINICAL DATA: Stroke. EXAM: CT HEAD WITHOUT CONTRAST TECHNIQUE: Contiguous axial images were obtained from the base of the skull through the vertex without intravenous contrast. COMPARISON: None. FINDINGS: There is a acute hemorrhage in the posterior RIGHT basal ganglia and posterior limb of the RIGHT internal capsule. Intraparenchymal hematoma measures 15 mm x 23 mm on axial imaging. This measures 25 mm craniocaudal. The hemorrhage also extends to the posterior lateral aspect of the RIGHT thalamus. 4 mm of RIGHT to LEFT midline shift. There is intraventricular hemorrhage associated with RIGHT through from the parenchymal hemorrhage. There is underlying atrophy and chronic ischemic white matter disease. Focal low attenuation is present in the LEFT posterior temporal lobe adjacent to the LEFT lateral ventricle atrium. This is favored to represent asymmetric chronic ischemic white matter disease however on old infarct for even subacute ischemia could have this appearance. Little if any effacement of sulci suggests that this is a chronic finding. The calvarium is intact. No hydrocephalus. The visible paranasal sinuses are within normal limits. RIGHT septal spur and septal deviation. Mastoid air cells clear. Intracranial atherosclerosis. IMPRESSION: 1. Acute posterior RIGHT basal ganglia and internal capsule hemorrhage measuring 15 mm x 23 mm. 2. 4 mm of RIGHT to LEFT midline shift. 3. Critical Value/emergent results were called by telephone at the time of interpretation on 04/07/2014 at 4:23 pm to Dr. Doy Mince, who verbally acknowledged these results. 4. Area of low  attenuation in the posterior LEFT temporal lobe may represent subacute ischemia but the appearance is more suggestive of either an old infarct or asymmetric chronic ischemic white matter disease. Electronically Signed By: Dereck Ligas  M.D. On: 04/07/2014 16:24     Assessment/Plan: Diagnosis: right BG/IC hemorrhage with left hemiparesis and hemisensory loss 1. Does the need for close, 24 hr/day medical supervision in concert with the patient's rehab needs make it unreasonable for this patient to be served in a less intensive setting? Yes 2. Co-Morbidities requiring supervision/potential complications: paf, htn, pad 3. Due to bladder management, bowel management, safety, skin/wound care, disease management, medication administration, pain management and patient education, does the patient require 24 hr/day rehab nursing? Yes 4. Does the patient require coordinated care of a physician, rehab nurse, PT (1-2 hrs/day, 5 days/week), OT (1-2 hrs/day, 5 days/week) and SLP (1-2 hrs/day, 5 days/week) to address physical and functional deficits in the context of the above medical diagnosis(es)? Yes Addressing deficits in the following areas: balance, endurance, locomotion, strength, transferring, bowel/bladder control, bathing, dressing, feeding, grooming, toileting, speech, swallowing and psychosocial support 5. Can the patient actively participate in an intensive therapy program of at least 3 hrs of therapy per day at least 5 days per week? Yes 6. The potential for patient to make measurable gains while on inpatient rehab is excellent 7. Anticipated functional outcomes upon discharge from inpatient rehab are supervision and min assist with PT, supervision and min assist with OT, modified independent and supervision with SLP. 8. Estimated rehab length of stay to reach the above functional goals is: 18-24 days 9. Does the patient have adequate social supports and living environment to accommodate these  discharge functional goals? Yes 10. Anticipated D/C setting: Home 11. Anticipated post D/C treatments: HH therapy and Outpatient therapy 12. Overall Rehab/Functional Prognosis: excellent  RECOMMENDATIONS: This patient's condition is appropriate for continued rehabilitative care in the following setting: CIR Patient has agreed to participate in recommended program. Yes Note that insurance prior authorization may be required for reimbursement for recommended care.  Comment: Rehab Admissions Coordinator to follow up.  Thanks,  Meredith Staggers, MD, [facility]     04/08/2014       Revision History     Date/Time User Provider Type Action   04/09/2014 10:22 AM Meredith Staggers, MD Physician Sign   04/08/2014 3:09 PM Cathlyn Parsons, PA-C Physician Assistant Pend   View Details Report       Routing History     Date/Time From To Method   04/09/2014 10:22 AM Meredith Staggers, MD Meredith Staggers, MD In Basket   04/09/2014 10:22 AM Meredith Staggers, MD Merrilee Seashore, MD Fax

## 2014-04-14 NOTE — Progress Notes (Signed)
63 y.o. right handed male history of HTN, COPD-oxygen dependent, A fib on Eliquis, s/p MAZE June 2014. He was independent prior to admission, lives alone and was using a walker due to back pain.  He was admitted on 04/07/2014 past  with left-sided weakness and mild slurred speech. CCT showed a right IC basal ganglia hemorrhage with a 4 mm right to left midline shift and BP 158/85 at admission. Eliquis was  reversed with Kcentra and BP monitored closely. Recommendations to repeat CCT in 3-4 weeks to help determine ability to resume Eliquis.  Repeat carotid dopplers done revealing 60- 79% L-ICA and 40- 59% R-ICA stenosis   Subjective/Complaints: Knee pain not bothersome in bed  Objective: Vital Signs: Blood pressure 134/49, pulse 110, temperature 98.7 F (37.1 C), temperature source Oral, resp. rate 17, height 5\' 10"  (1.778 m), weight 97.9 kg (215 lb 13.3 oz), SpO2 96 %. Dg Knee 1-2 Views Right  04/13/2014   CLINICAL DATA:  Chronic anterior right knee pain for 2 years. Initial encounter.  EXAM: RIGHT KNEE - 1-2 VIEW  COMPARISON:  None.  FINDINGS: There is no evidence of fracture or dislocation. The joint spaces are preserved. There is mild cortical irregularity at the lateral compartment, and marginal osteophytes are seen arising at the medial and lateral compartments. There is suggestion of mild chondrocalcinosis.  A small to moderate knee joint effusion is seen. Scattered clips are noted about the posterior aspect of the knee. Diffuse vascular calcifications are seen.  IMPRESSION: 1. No evidence of fracture or dislocation. 2. Small to moderate knee joint effusion noted. 3. Mild osteoarthritis seen, with suggestion of mild chondrocalcinosis. 4. Diffuse vascular calcifications noted.   Electronically Signed   By: Garald Balding M.D.   On: 04/13/2014 23:27   Results for orders placed or performed during the hospital encounter of 04/07/14 (from the past 72 hour(s))  Glucose, capillary     Status: Abnormal    Collection Time: 04/11/14 12:28 PM  Result Value Ref Range   Glucose-Capillary 145 (H) 70 - 99 mg/dL   Comment 1 Notify RN    Comment 2 Documented in Chart   Glucose, capillary     Status: Abnormal   Collection Time: 04/11/14  4:56 PM  Result Value Ref Range   Glucose-Capillary 145 (H) 70 - 99 mg/dL   Comment 1 Notify RN    Comment 2 Documented in Chart   Glucose, capillary     Status: Abnormal   Collection Time: 04/11/14 10:18 PM  Result Value Ref Range   Glucose-Capillary 103 (H) 70 - 99 mg/dL   Comment 1 Documented in Chart    Comment 2 Notify RN   Glucose, capillary     Status: Abnormal   Collection Time: 04/12/14  7:05 AM  Result Value Ref Range   Glucose-Capillary 133 (H) 70 - 99 mg/dL   Comment 1 Documented in Chart    Comment 2 Notify RN   Glucose, capillary     Status: Abnormal   Collection Time: 04/12/14 11:44 AM  Result Value Ref Range   Glucose-Capillary 136 (H) 70 - 99 mg/dL   Comment 1 Notify RN    Comment 2 Documented in Chart   Glucose, capillary     Status: Abnormal   Collection Time: 04/12/14  4:36 PM  Result Value Ref Range   Glucose-Capillary 140 (H) 70 - 99 mg/dL   Comment 1 Notify RN    Comment 2 Documented in Chart   Glucose, capillary  Status: Abnormal   Collection Time: 04/12/14  9:53 PM  Result Value Ref Range   Glucose-Capillary 109 (H) 70 - 99 mg/dL   Comment 1 Documented in Chart    Comment 2 Notify RN   Glucose, capillary     Status: Abnormal   Collection Time: 04/13/14  6:37 AM  Result Value Ref Range   Glucose-Capillary 125 (H) 70 - 99 mg/dL   Comment 1 Documented in Chart    Comment 2 Notify RN   Glucose, capillary     Status: Abnormal   Collection Time: 04/13/14  7:39 AM  Result Value Ref Range   Glucose-Capillary 118 (H) 70 - 99 mg/dL   Comment 1 Documented in Chart    Comment 2 Notify RN      HEENT: normal and O2 [place] Cardio: RRR and murmur Resp: CTA B/L and unlabored GI: BS positive and NT, ND Extremity:  No  Edema Skin:   Bruise multiple eccymotic areas in BUE and BLE Neuro: Alert/Oriented, Cranial Nerve II-XII normal, Abnormal Sensory reduced on Left, Abnormal Motor 3/5 LUE, 3- Left HF, 4/5 L KE, 3- L ankle DF and Abnormal FMC Ataxic/ dec FMC Musc/Skel:  Other No swelling or pain with AROM Right knee Gen NAD   Assessment/Plan: 1. Functional deficits secondary to right BG/IC hemorrhage with left hemiparesis and hemisensory loss which require 3+ hours per day of interdisciplinary therapy in a comprehensive inpatient rehab setting. Physiatrist is providing close team supervision and 24 hour management of active medical problems listed below. Physiatrist and rehab team continue to assess barriers to discharge/monitor patient progress toward functional and medical goals. FIM:                   Comprehension Comprehension Mode: Auditory Comprehension: 5-Understands complex 90% of the time/Cues < 10% of the time  Expression Expression Mode: Verbal Expression: 5-Expresses complex 90% of the time/cues < 10% of the time  Social Interaction Social Interaction: 7-Interacts appropriately with others - No medications needed.  Problem Solving Problem Solving: 5-Solves basic problems: With no assist  Memory Memory: 5-Recognizes or recalls 90% of the time/requires cueing < 10% of the time  Medical Problem List and Plan: 1. Functional deficits secondary to right BG/IC hemorrhage with left hemiparesis and hemisensory loss 2.  DVT Prophylaxis/Anticoagulation: Continue SCDs. Add TEDs. Monitor for any signs of DVT. Check follow up CT head early Jan.   3. Pain Management/chronic back pain: Tylenol as needed for pain. Add voltaren gel. Continue Zanaflex 8 mg twice a day 4. Dysphagia. Dysphagia 3 nectar liquids. Monitor for any signs of aspiration. Follow-up speech therapy 5. Neuropsych: This patient is capable of making decisions on her own behalf. 6. Skin/Wound Care: Routine skin checks 7.  Fluids/Electrolytes/Nutrition: Strict I and O's. Follow-up chemistries 8. Hypertension. Monitor every 8 hours. Continue Coreg 12.5 mg twice a day, Aldactone 25 mg daily, Demadex as directed, Zaroxolyn 2.5 mg Monday Wednesday Friday. Monitor with increased mobility 9. Atrial fibrillation/status post Maze procedure June 2014: Eliquis on hold secondary to hemorrhage. Continue Lanoxin 0.25 mg every other day. Monitor HR tid.   10. Hyperlipidemia. Lipitor 11. History of gouty arthritis: Continue Colchicine 0.6 mg twice a day--no signs of flare up pain likely due to compensation for left side. . 12. COPD: Respiratory status stable. Continue Symbicort twice daily. 13. H/o  Peripheral edema with weeping: Controlled due to lack of activity. Continue diuretics and elevate when seated. Low salt diet. Monitor with increase in mobility.  14. Constipation: Will increase senna and augment regimen as needed.    LOS (Days) 1 A FACE TO FACE EVALUATION WAS PERFORMED   Blasius E 04/14/2014, 7:17 AM

## 2014-04-15 ENCOUNTER — Inpatient Hospital Stay (HOSPITAL_COMMUNITY): Payer: BLUE CROSS/BLUE SHIELD | Admitting: Speech Pathology

## 2014-04-15 ENCOUNTER — Inpatient Hospital Stay (HOSPITAL_COMMUNITY): Payer: BLUE CROSS/BLUE SHIELD | Admitting: Occupational Therapy

## 2014-04-15 ENCOUNTER — Inpatient Hospital Stay (HOSPITAL_COMMUNITY): Payer: BLUE CROSS/BLUE SHIELD | Admitting: Physical Therapy

## 2014-04-15 MED ORDER — DICLOFENAC SODIUM 1 % TD GEL: 2.0000 g | Freq: Four times a day (QID) | TRANSDERMAL | Status: DC

## 2014-04-15 NOTE — Progress Notes (Signed)
Speech Language Pathology Daily Session Note  Patient Details  Name: [name] MRN: [id number] Date of Birth: June 27, 1950  Today's Date: 04/15/2014 SLP Individual Time: 0930-1000 SLP Individual Time Calculation (min): 30 min  Short Term Goals: Week 1: SLP Short Term Goal 1 (Week 1): Pt will tolerate presentations of his currently prescribed diet with no overt s/s of aspiration and mod I use of swallowing precautions.  SLP Short Term Goal 2 (Week 1): Pt will tolerate trials of upgraded consistencies with no overt s/s of aspiration and mod I use of swallowing precautions.  SLP Short Term Goal 3 (Week 1): Pt will complete oral motor exercises targeting left sided weakness to improve mastication and manipulation of regular solid boluses with supervision.   Skilled Therapeutic Interventions: Skilled treatment session focused on diagnostic treatment of the patient's current cognitive function. SLP facilitated session by administering the MoCA. Patient scored a 20/30 with a score of 26 or above considered normal. Patient demonstrated deficits in the areas of recall and verbal fluency/organization. Patient educated on results of test and goals of skilled SLP intervention in regards to his cognitive function, he verbalized understanding. Patient also required supervision multimodal cues to attend to his left upper extremity throughout the session. Patient left in wheelchair with all needs within reach. Continue with current plan of care.    FIM:  Comprehension Comprehension Mode: Auditory Comprehension: 5-Understands complex 90% of the time/Cues < 10% of the time Expression Expression Mode: Verbal Expression: 5-Expresses complex 90% of the time/cues < 10% of the time Social Interaction Social Interaction: 6-Interacts appropriately with others with medication or extra time (anti-anxiety, antidepressant). Problem Solving Problem Solving: 4-Solves basic 75 - 89% of the time/requires cueing 10 -  24% of the time Memory Memory: 4-Recognizes or recalls 75 - 89% of the time/requires cueing 10 - 24% of the time FIM - Eating Eating Activity: 6: Modified consistency diet: (comment);5: Supervision/cues;5: Set-up assist for open containers  Pain Pain Assessment Pain Assessment: No/denies pain   Therapy/Group: Individual Therapy  Emeterio Balke 04/15/2014, 1:15 PM

## 2014-04-15 NOTE — Patient Care Conference (Signed)
Inpatient RehabilitationTeam Conference and Plan of Care Update Date: 04/15/2014   Time: 10:40 AM    Patient Name: [name]      Medical Record Number: [id number]  Date of Birth: 16-Mar-1951 Sex: Male         Room/Bed: 4M07C/4M07C-01 Payor Info: Payor: [place] / Plan: [employer] [place] PPO / Product Type: *No Product type* /    Admitting Diagnosis: R BG ICH  Admit Date/Time:  04/13/2014  4:21 PM Admission Comments: No comment available   Primary Diagnosis:  Acute left hemiparesis Principal Problem: Acute left hemiparesis  Patient Active Problem List   Diagnosis Date Noted  . Acute left hemiparesis 04/13/2014  . ICH (intracerebral hemorrhage) 04/07/2014  . Bilateral carotid artery disease 03/04/2014  . Acute cor pulmonale 01/14/2014  . RVF (right ventricular failure) 01/14/2014  . Hypotension 10/23/2013  . Chronic combined systolic and diastolic heart failure 14/43/1540  . Back pain, spinal stenosis 09/30/2013  . Alcohol abuse 09/28/2013  . 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 Chronic anticoagulation 11/13/2012  . Long term (current) use of anticoagulants, Eliquis 10/23/2012  . Chronic a-fib- failed Amio-DCCV  10/23/2012  . Gout flare. 09/29/12, Lt knee, improved with colchicine. 09/30/2012  . Aortic stenosis, severe- Tissue AVR 5/14 09/23/2012  . CAD, CABG X 2 with SVG-PD/PL 5/14 09/23/2012  . Cellulitis, lower extremity- treated-May 2015 09/23/2012  . PAD (peripheral artery disease), decreased bil. ABIs 09/23/2012  . COPD on home oxygen 09/23/2012  . Acute respiratory failure, with BiPAP needed on admit 09/16/2012  . PAF - Maze at the time of his CABG/AVR 5/14. Recurrent a fib On Amiodarone, DCCV this admit with recurrent a fib, now persistent a fib 09/16/2012  . HTN (hypertension) 09/16/2012  . Hyperlipidemia 09/16/2012  . Noncompliance, no meds for two weeks prior to admission 09/16/2012  . Tobacco use 09/16/2012  . Acute combined systolic and diastolic  CHF - EF  08-67% June 2015  09/16/2012  . Bilateral lower extremity edema, secondary to chronic CHF 09/16/2012    Expected Discharge Date: Expected Discharge Date: 05/05/14  Team Members Present: Physician leading conference: Dr. Alysia Penna Social Worker Present: Alfonse Alpers, LCSW Nurse Present: Elliot Cousin, RN PT Present: Georjean Mode, PT;Blair Hobble, Cecille Rubin, PT OT Present: Simonne Come, OT SLP Present: Windell Moulding, SLP PPS Coordinator present : Daiva Nakayama, RN, CRRN     Current Status/Progress Goal Weekly Team Focus  Medical   R knee varus deformity and pain  maintain R and L knee stability  knee pain Right   Bowel/Bladder   Continent of bowel and bladder; LBM 12/22 after suppository; on senna 2 tabs BID  min assist  Assess and treat for constipation; maintain current regimen   Swallow/Nutrition/ Hydration   Dys 3, nectar thick liquids  Mod I with least restrictive diet   trials of advanced consistencies   ADL's   mod assist bathing at bed level, min assist UB dressing, total +2 Lb dresswing and transfers  supervision  LUE NMR, motor control, transfers, sit <> stand   Mobility   Total to +2A overall; limited by pain, instability in R knee  Supervision at w/c level  Control R knee pain, bed mobility, functional transfers, L NMR, attention to L side of body, activity tolerance   Communication             Safety/Cognition/ Behavioral Observations  grossly intact for basic tasks, recommend eval of higher level cognition  Pain   C/o pain in right knee; relieved with scheduled voltaren gel  < 3  Assess and treat for pain q shift and prn   Skin   Multiple skin tears to left arm and left leg; very thin skin; tegaderms in place; placed ace wrap for extra protection  Min assist  Assess skin q shift and prn; change dressings as needed    Rehab Goals Patient on target to meet rehab goals: Yes Rehab Goals Revised: PT set supervision level from w/c  goals *See Care Plan and progress notes for long and short-term goals.  Barriers to Discharge: few social contacts    Possible Resolutions to Barriers:  upgrade status, identify potential caregivers    Discharge Planning/Teaching Needs:  Pt would like to return to his home, however pt does not have a caregiver and team anticipates he will need at a minimum supervision.  Pt may need SNF initially at d/c.  We can provide family education if pt is able to safely go home.   Team Discussion:  Pt is doing well medically.  He will need a repeat cranial CT in a couple of weeks to determine if pt can safely be restarted on his anti-coagulant.  Pt is usually continent of bowel and bladder, but ST found pt incontinent of bowel on 04-14-14.  Pt is eating poorly, but has done well with fluids and Dr. Letta Pate hopes to remove pt's IV access.  This would be helpful for pt's skin, as RN reports it is very thin with multiple skin tears.  ST hopes to upgrade diet at bedside and wants to start the water protocol soon.  With PT, pt is still a 2 person assist.  He has great strength on the left side, but has a lot of inattention and lack of motor control.  Pt with right knee pain and MD to try Voltaren gel and PT to try a brace to prevent hyperextension.  Pt is very motivated.  Revisions to Treatment Plan:  none   Continued Need for Acute Rehabilitation Level of Care: The patient requires daily medical management by a physician with specialized training in physical medicine and rehabilitation for the following conditions: Daily direction of a multidisciplinary physical rehabilitation program to ensure safe treatment while eliciting the highest outcome that is of practical value to the patient.: Yes Daily medical management of patient stability for increased activity during participation in an intensive rehabilitation regime.: Yes Daily analysis of laboratory values and/or radiology reports with any subsequent need  for medication adjustment of medical intervention for : Neurological problems;Other  Vernetta Dizdarevic, Silvestre Mesi 04/15/2014, 12:31 PM

## 2014-04-15 NOTE — Progress Notes (Signed)
Occupational Therapy Session Note  Patient Details  Name: [name] MRN: [id number] Date of Birth: 13-Jul-1950  Today's Date: 04/15/2014 OT Individual Time: 0700-0800 and 1315-1345 OT Individual Time Calculation (min): 60 min and 30 min   Short Term Goals: Week 1:  OT Short Term Goal 1 (Week 1): Patient will perform UB/LB bathing in sit<>stand position with moderate assistance (1 person), using mechanical lift prn OT Short Term Goal 2 (Week 1): Patient will perform UB dressing with min assist while seated unsupported OT Short Term Goal 3 (Week 1): Patient will perform LB dressing with moderate assistance in sit<>stand position (1 person), using mechanical lift prn  OT Short Term Goal 4 (Week 1): Patient will perform toilet transfer using BSC prn with moderate assistance (1 person), using mechanical lift prn OT Short Term Goal 5 (Week 1): Patient will be educated on a strengthening HEP  Skilled Therapeutic Interventions/Progress Updates:    1) Engaged in ADL retraining with focus on bed mobility, sitting balance, education on UB and LB hemi-dressing technique, and transfers.  Completed LB bathing at bed level with rolling Rt and Lt with min-mod assist and mod assist supine > sit.  Educated on functional use of LUE with bathing, pt with good activation of LUE when attempting to wash Rt arm and BLE, however pt with decreased motor control.  Educated on use of vision to compensate for decreased sensation and proprioception to increase functional use of LUE.  Engaged in sit > stand x2 with +2 assist, pt with good strength with sit > stand however requiring +2 for motor control and pulling up pants in standing.  Pt reports pain in Rt knee in standing. Squat pivot transfer bed > w/c with focus on keeping low center of gravity to attempt to improve motor control, +2 for safety.  2) Engaged in therapeutic activity with focus on sit <> stand and functional use of LUE.  In therapy gym attempted to  engage in table top task in standing, however upon standing (with +2) pt with increased pain in Rt knee and crepitus in B knees.  Consulted primary PT, applied Swedish knee cage with improved knee control/decreased hyperextension and improved fluidity of sit > stand.  Pt tolerated standing with multimodal cues to promote upright standing for ~30 seconds, pt reports increased fatigue post standing.  Engaged in reaching activity in sitting with focus on motor control and fluidity of movement with clothes pins to promote grading of movement.  Pt required increased time with task and dropped clothes pins approx 50% of time.    Therapy Documentation Precautions:  Precautions Precautions: Fall Restrictions Weight Bearing Restrictions: No General:   Vital Signs: Therapy Vitals Temp: 98.5 F (36.9 C) Temp Source: Oral Pulse Rate: (!) 110 Resp: 18 BP: 120/65 mmHg Oxygen Therapy SpO2: 99 % O2 Device: Nasal Cannula O2 Flow Rate (L/min): 2 L/min Pain: Pain Assessment Pain Assessment: No/denies pain Pain Score: 0-No pain  See FIM for current functional status  Therapy/Group: Individual Therapy  Simonne Come 04/15/2014, 9:01 AM

## 2014-04-15 NOTE — Progress Notes (Signed)
63 y.o. right handed male history of HTN, COPD-oxygen dependent, A fib on Eliquis, s/p MAZE June 2014. He was independent prior to admission, lives alone and was using a walker due to back pain.  He was admitted on 04/07/2014 past  with left-sided weakness and mild slurred speech. CCT showed a right IC basal ganglia hemorrhage with a 4 mm right to left midline shift and BP 158/85 at admission. Eliquis was  reversed with Kcentra and BP monitored closely. Recommendations to repeat CCT in 3-4 weeks to help determine ability to resume Eliquis.  Repeat carotid dopplers done revealing 60- 79% L-ICA and 40- 59% R-ICA stenosis   Subjective/Complaints: No pain, denies bladder incont but spilled urinal in bed last noc Review of Systems - Negative except on O2 all the time Objective: Vital Signs: Blood pressure 120/65, pulse 110, temperature 98.5 F (36.9 C), temperature source Oral, resp. rate 18, height '5\' 10"'  (1.778 m), weight 97.9 kg (215 lb 13.3 oz), SpO2 99 %. Dg Knee 1-2 Views Right  04/13/2014   CLINICAL DATA:  Chronic anterior right knee pain for 2 years. Initial encounter.  EXAM: RIGHT KNEE - 1-2 VIEW  COMPARISON:  None.  FINDINGS: There is no evidence of fracture or dislocation. The joint spaces are preserved. There is mild cortical irregularity at the lateral compartment, and marginal osteophytes are seen arising at the medial and lateral compartments. There is suggestion of mild chondrocalcinosis.  A small to moderate knee joint effusion is seen. Scattered clips are noted about the posterior aspect of the knee. Diffuse vascular calcifications are seen.  IMPRESSION: 1. No evidence of fracture or dislocation. 2. Small to moderate knee joint effusion noted. 3. Mild osteoarthritis seen, with suggestion of mild chondrocalcinosis. 4. Diffuse vascular calcifications noted.   Electronically Signed   By: Garald Balding M.D.   On: 04/13/2014 23:27   Results for orders placed or performed during the hospital  encounter of 04/13/14 (from the past 72 hour(s))  Glucose, capillary     Status: Abnormal   Collection Time: 04/13/14 12:00 PM  Result Value Ref Range   Glucose-Capillary 145 (H) 70 - 99 mg/dL   Comment 1 Notify RN    Comment 2 Documented in Chart      HEENT: normal and O2 [place] Cardio: RRR and murmur Resp: CTA B/L and unlabored GI: BS positive and NT, ND Extremity:  No Edema Skin:   Bruise multiple eccymotic areas in BUE and BLE Neuro: Alert/Oriented, Cranial Nerve II-XII normal, Abnormal Sensory reduced on Left, Abnormal Motor 3/5 LUE, 3- Left HF, 4/5 L KE, 3- L ankle DF and Abnormal FMC Ataxic/ dec FMC Musc/Skel:  Other No swelling or pain with AROM Right knee Gen NAD   Assessment/Plan: 1. Functional deficits secondary to right BG/IC hemorrhage with left hemiparesis and hemisensory loss which require 3+ hours per day of interdisciplinary therapy in a comprehensive inpatient rehab setting. Physiatrist is providing close team supervision and 24 hour management of active medical problems listed below. Physiatrist and rehab team continue to assess barriers to discharge/monitor patient progress toward functional and medical goals. Team conference today please see physician documentation under team conference tab, met with team face-to-face to discuss problems,progress, and goals. Formulized individual treatment plan based on medical history, underlying problem and comorbidities. FIM: FIM - Bathing Bathing: 0: Activity did not occur  FIM - Upper Body Dressing/Undressing Upper body dressing/undressing: 0: Wears gown/pajamas-no public clothing FIM - Lower Body Dressing/Undressing Lower body dressing/undressing: 0: Wears Interior and spatial designer  FIM - Toileting Toileting Assistive Devices: Grab bar or rail for support Toileting: 1: Two helpers (using STEDY to assist)  FIM - Radio producer Devices: Bedside commode, Grab bars Toilet Transfers: 1-Two  helpers, 1-Mechanical lift  FIM - IT sales professional Transfer: 1: Two helpers  FIM - Locomotion: Wheelchair Distance: 15 Locomotion: Wheelchair: 1: Travels less than 50 ft with maximal assistance (Pt: 25 - 49%) FIM - Locomotion: Ambulation Ambulation/Gait Assistance: 1: +2 Total assist Locomotion: Ambulation: 1: Two helpers  Comprehension Comprehension Mode: Auditory Comprehension: 5-Understands complex 90% of the time/Cues < 10% of the time  Expression Expression Mode: Verbal Expression: 5-Expresses complex 90% of the time/cues < 10% of the time  Social Interaction Social Interaction: 6-Interacts appropriately with others with medication or extra time (anti-anxiety, antidepressant).  Problem Solving Problem Solving: 5-Solves basic 90% of the time/requires cueing < 10% of the time  Memory Memory: 5-Recognizes or recalls 90% of the time/requires cueing < 10% of the time  Medical Problem List and Plan: 1. Functional deficits secondary to right BG/IC hemorrhage with left hemiparesis and hemisensory loss 2.  DVT Prophylaxis/Anticoagulation: Continue SCDs. Add TEDs. Monitor for any signs of DVT. Check follow up CT head early Jan.   3. Pain Management/chronic back pain: Tylenol as needed for pain. Add voltaren gel. Continue Zanaflex 8 mg twice a day 4. Dysphagia. Dysphagia 3 nectar liquids. Monitor for any signs of aspiration. Follow-up speech therapy 5. Neuropsych: This patient is capable of making decisions on her own behalf. 6. Skin/Wound Care: Routine skin checks 7. Fluids/Electrolytes/Nutrition: Strict I and O's. Follow-up chemistries 8. Hypertension. Monitor every 8 hours. Continue Coreg 12.5 mg twice a day, Aldactone 25 mg daily, Demadex as directed, Zaroxolyn 2.5 mg Monday Wednesday Friday. Monitor with increased mobility 9. Atrial fibrillation/status post Maze procedure June 2014: Eliquis on hold secondary to hemorrhage. Continue Lanoxin 0.25 mg every other day.  Monitor HR tid.   10. Hyperlipidemia. Lipitor 11. History of gouty arthritis: Continue Colchicine 0.6 mg twice a day--no signs of flare up pain likely due to compensation for left side. . 12. COPD: Respiratory status stable. Continue Symbicort twice daily. 13. H/o  Peripheral edema with weeping: Controlled due to lack of activity. Continue diuretics and elevate when seated. Low salt diet. Monitor with increase in mobility.   14. Constipation: Will increase senna and augment regimen as needed.    LOS (Days) 2 A FACE TO FACE EVALUATION WAS PERFORMED  Tiarah Shisler E 04/15/2014, 6:52 AM

## 2014-04-15 NOTE — Progress Notes (Signed)
Orthopedic Tech Progress Note Patient Details:  [name] 1951-03-23 [id number]  Patient ID: [name], male   DOB: 1951/01/05, 63 y.o.   MRN: [id number] Called in advanced brace order; spoke with Jane Canary, [name] 04/15/2014, 12:02 PM

## 2014-04-15 NOTE — Progress Notes (Signed)
Physical Therapy Session Note  Patient Details  Name: [name] MRN: [id number] Date of Birth: [birth year]  Today's Date: 04/15/2014 PT Individual Time: 0830-0930 PT Individual Time Calculation (min): 60 min   Short Term Goals: Week 1:  PT Short Term Goal 1 (Week 1): Pt will perform bed mobility with mod A with HOB flat using rail. PT Short Term Goal 2 (Week 1): Pt will transfer from bed<>w/c with max A of single therapist. PT Short Term Goal 3 (Week 1): Pt will perform w/c mobility x50' with min A and 25% cueing. PT Short Term Goal 4 (Week 1): Pt will ambulate x25' wth Total A of single therapist (and w/c follow for safety, if needed). PT Short Term Goal 5 (Week 1): Pt will perform static standing with single UE support requiring min A and 25% cueing for midline orientation.  Skilled Therapeutic Interventions/Progress Updates:    2:1. Pt received seated in w/c on 3 L/min supplemental O2 via [place]; agreeable to therapy. Session focused on increasing pt independence with functioanl transfers. Standing limited by pain in R knee. Noted significant R genu valgum and minimal R genu recurvatum in standing. Pt reports no R knee pain with partial standing.   Pt performed lateral scooting transfers from w/c>mat table (to R side) using slide board with Total A (second person close by for safety) and max multimodal cueing for full anterior weight shift, manual repositioning of LLE. Pt required +2A for squat pivot transfer from mat table>w/c (to L side). Pt with effective within-session carryover of >75% of cueing for transfer setup. See below for detailed description of NMR. Pt requires frequent, prolonged rest breaks (per pt request). SpO2 >97% while on 3L/min supplemental O2 throughout this session.  Session ended in pt room, where pt was left seated in w/c with L half lap tray on, all needs within reach, and pt on 3 L/min supplemental O2, [place].  Therapy Documentation Precautions:   Precautions Precautions: Fall Restrictions Weight Bearing Restrictions: No Pain: Pain Assessment Pain Assessment: FLACC Pain Score: 4  Pain Type: Chronic pain Pain Location: Knee Pain Orientation: Right Pain Descriptors / Indicators: Sharp Pain Onset: Other (Comment) (With standing) Pain Intervention(s): Repositioned;Other (Comment) (compression) Locomotion : Wheelchair Mobility Distance: 20  NMR:   Neuromuscular Facilitation: Lower Extremity;Left;Activity to increase motor control;Activity to increase grading;Activity to increase anterior-posterior weight shifting;Activity to increase lateral weight shifting;Activity to increase sustained activation Focused on facilitating full anterior weight shift to increase stability with functional transfers/mobility.  Seated EOM, pt performed RUE anterior/anterolateral reaching to facilitate active pt initiation of anterior weight shift; tactile cueing at L knee (for activation) and at R ribcage for anterior weight shift while maintaining erect spine. Pt performed multiple partial stands with bilat forearms supported at standard chair positioned in front of pt. Cueing as described above.  See FIM for current functional status  Therapy/Group: Individual Therapy  Hobble, Malva Cogan 04/15/2014, 10:59 AM

## 2014-04-15 NOTE — Progress Notes (Signed)
Social Work Patient ID: [name], male   DOB: Sep 19, 1950, 63 y.o.   MRN: [id number]   CSW met with pt to update him on team conference discussion.  Pt was pleased to hear that he would be able to try the water protocol soon.  CSW told him speech would discuss it further with him 04-16-14.  CSW shared targeted d/c date of 05-05-14 and he said that seemed a long time off.  CSW explained that we would re-evaluate that weekly based on progress and planned d/c disposition.  CSW told pt that message was left for his dtr and that CSW would either talk with her via telephone or meet with her when she visits 04-16-14.  CSW will continue to follow.

## 2014-04-16 ENCOUNTER — Inpatient Hospital Stay (HOSPITAL_COMMUNITY): Payer: BLUE CROSS/BLUE SHIELD | Admitting: Speech Pathology

## 2014-04-16 ENCOUNTER — Inpatient Hospital Stay (HOSPITAL_COMMUNITY): Payer: Self-pay

## 2014-04-16 ENCOUNTER — Inpatient Hospital Stay (HOSPITAL_COMMUNITY): Payer: BLUE CROSS/BLUE SHIELD | Admitting: Occupational Therapy

## 2014-04-16 ENCOUNTER — Encounter (HOSPITAL_COMMUNITY): Payer: Self-pay | Admitting: Occupational Therapy

## 2014-04-16 DIAGNOSIS — IMO0002 Reserved for concepts with insufficient information to code with codable children: Secondary | ICD-10-CM

## 2014-04-16 NOTE — Progress Notes (Signed)
Occupational Therapy Note  Patient Details  Name: JSON KOELZER MRN: [id number] Date of Birth: 10/18/1950  Today's Date: 04/16/2014 OT Individual Time: 1000-1030 OT Individual Time Calculation (min): 30 min   Pt denied pain Individual Therapy  Pt resting in w/c upon arrival.  Pt engaged in LUE reaching tasks with emphasis on UE control, grasp and release of objects, and isolated finger movements.  Pt exhibited decreased sensation, proprioception, and controlled movements of LUE.  Pt grossly grasped large objects and released them on command.  Pt exhibited some frustration with task.   [facility] 04/16/2014, 2:18 PM

## 2014-04-16 NOTE — Progress Notes (Signed)
Speech Language Pathology Daily Session Note  Patient Details  Name: [name] MRN: [id number] Date of Birth: 1950/11/01  Today's Date: 04/16/2014 SLP Individual Time: 1410-1440 SLP Individual Time Calculation (min): 30 min  Short Term Goals: Week 1: SLP Short Term Goal 1 (Week 1): Pt will tolerate presentations of his currently prescribed diet with no overt s/s of aspiration and mod I use of swallowing precautions.  SLP Short Term Goal 2 (Week 1): Pt will tolerate trials of upgraded consistencies with no overt s/s of aspiration and mod I use of swallowing precautions.  SLP Short Term Goal 3 (Week 1): Pt will complete oral motor exercises targeting left sided weakness to improve mastication and manipulation of regular solid boluses with supervision.   Skilled Therapeutic Interventions: Skilled treatment session focused on addressing dysphagia goals.  SLP facilitated session with education regarding rationale and procedures for Free Water Protocol; patient required Min question cues to recall information during teach back opportunities at end of session.  Patient performed oral care via suctioning and small single cup sips of water with Supervision verbal cues; patient demonstrated no overt s/s of aspiration throughout trials.  Recommend initiation if the water protocol.    FIM:  Comprehension Comprehension Mode: Auditory Comprehension: 5-Understands complex 90% of the time/Cues < 10% of the time Expression Expression Mode: Verbal Expression: 5-Expresses basic 90% of the time/requires cueing < 10% of the time. Social Interaction Social Interaction: 6-Interacts appropriately with others with medication or extra time (anti-anxiety, antidepressant). Problem Solving Problem Solving: 5-Solves basic 90% of the time/requires cueing < 10% of the time Memory Memory: 4-Recognizes or recalls 75 - 89% of the time/requires cueing 10 - 24% of the time FIM - Eating Eating Activity: 5: Needs  verbal cues/supervision  Pain Pain Assessment Pain Assessment: No/denies pain  Therapy/Group: Individual Therapy  Carmelia Roller., CCC-SLP [phone]  [place] 04/16/2014, 4:20 PM

## 2014-04-16 NOTE — Progress Notes (Signed)
Physical Therapy Session Note  Patient Details  Name: [name] MRN: [id number] Date of Birth: Sep 28, 1950  Today's Date: 04/16/2014 PT Co-Treatment Time: 0900-0930 (Co-tx with SPH; entire session from 830-930) PT Co-Treatment Time Calculation (min): 30 min  Short Term Goals: Week 1:  PT Short Term Goal 1 (Week 1): Pt will perform bed mobility with mod A with HOB flat using rail. PT Short Term Goal 2 (Week 1): Pt will transfer from bed<>w/c with max A of single therapist. PT Short Term Goal 3 (Week 1): Pt will perform w/c mobility x50' with min A and 25% cueing. PT Short Term Goal 4 (Week 1): Pt will ambulate x25' wth Total A of single therapist (and w/c follow for safety, if needed). PT Short Term Goal 5 (Week 1): Pt will perform static standing with single UE support requiring min A and 25% cueing for midline orientation.  Skilled Therapeutic Interventions/Progress Updates:    Skilled co-treatment with primary OT focusing on functional transfers, standing tolerance, and postural control. See OT note for further detail regarding UB/LB dressing. Pt received seated EOB accompanied by OT having just completed B&D. Pt on 3L/min supplemental O2 via [place]. Decreased supplemental O2 to 2 L/min during this session with SpO2 >97% throughout. Donned hinged knee brace at RLE. Brace appears to be insufficient to control R genu recurvatum/varum in standing. Therefore, R Swedish knee cage worn throughout this session to protect R knee joint during all standing.  Pt performed squat pivot transfers from bed<>w/c<>mat table with +2A (final 2 transfers with max A but additional therapist closeby for safety). With transfers, pt continues to require multimodal cueing for full anterior weight shift. Pt also required mod-max cueing for attention to LUE/LLE positioning/management. In gym, pt performed multiple sit<>stand transfers from [facility]; initial 2 sit<>stands with +2A of PT/OT with manual stabilization of L knee  during standing. Transitioned to static standing with bilat UE support at EVA walker for increased LUE weightbearing (increased proprioception, activation) and for kinesthetic awareness, midline orientation. Pt able to maintain static standing with +2A, manual stabilization of L knee (to prevent buckling) x1 minute, 10 seconds then x1 minute, 25 seconds with seated rest breaks between trials. Session ended in pt room, where pt was left seated in w/c with all needs within reach and pt on 2 L/min supplemental O2 via [place], vitals WNL.  Therapy Documentation Precautions:  Precautions Precautions: Fall Restrictions Weight Bearing Restrictions: No Vital Signs: Therapy Vitals Pulse Rate: (!) 114 Patient Position (if appropriate): Sitting (Immediately after standing x1.5 minutes) Oxygen Therapy SpO2: 98 % O2 Device: Nasal Cannula O2 Flow Rate (L/min): 2 L/min Pain: Pain Assessment Pain Assessment: FLACC Pain Score: 3  Pain Type: Chronic pain Pain Location: Knee Pain Orientation: Right Pain Descriptors / Indicators: Sharp Pain Onset: Other (Comment) (with standing) Pain Intervention(s): Other (Comment);Repositioned (bracing (Swedish knee cage))  See FIM for current functional status  Therapy/Group: Co-Treatment  Hobble, Blair A 04/16/2014, 11:00 AM

## 2014-04-16 NOTE — IPOC Note (Addendum)
Overall Plan of Care [facility]) Patient Details Name: [name] MRN: [id number] DOB: Jun 19, 1950  Admitting Diagnosis: R BG ICH  Hospital Problems: Principal Problem:   Acute left hemiparesis Active Problems:   PAF - Maze at the time of his CABG/AVR 5/14. Recurrent a fib On Amiodarone, DCCV this admit with recurrent a fib, now persistent a fib   Bilateral lower extremity edema, secondary to chronic CHF   COPD on home oxygen   ICH (intracerebral hemorrhage)   Altered sensation due to stroke     Functional Problem List: Nursing Bowel, Edema, Endurance, Medication Management, Motor, Nutrition, Skin Integrity  PT Balance, Skin Integrity, Endurance, Motor, Perception, Safety, Sensory  OT Balance, Edema, Endurance, Motor, Pain, Perception, Safety, Sensory, Skin Integrity  SLP Nutrition  TR         Basic ADL's: OT Eating, Grooming, Bathing, Dressing, Toileting     Advanced  ADL's: OT  (n/a at this time)     Transfers: PT Bed Mobility, Bed to Chair, Car, Manufacturing systems engineer, Metallurgist: PT Ambulation, Emergency planning/management officer, Stairs     Additional Impairments: OT Fuctional Use of Upper Extremity  SLP Swallowing      TR      Anticipated Outcomes Item Anticipated Outcome  Self Feeding mod I  Swallowing  Mod I with least restrictive diet    Basic self-care  supervision  Toileting  supervision   Bathroom Transfers supervision  Bowel/Bladder  Mod I  Transfers  Supervision-Min A  Locomotion  Min-Mod A  Communication     Cognition  Mod I  Pain  < 3  Safety/Judgment  Supervision   Therapy Plan: PT Intensity: Minimum of 1-2 x/day ,45 to 90 minutes PT Frequency: 5 out of 7 days PT Duration Estimated Length of Stay: 21 days OT Intensity: Minimum of 1-2 x/day, 45 to 90 minutes OT Frequency: 5 out of 7 days OT Duration/Estimated Length of Stay: 3-4 weeks SLP Intensity: Minumum of 1-2 x/day, 30 to 90 minutes SLP Frequency: 5 out of 7 days SLP  Duration/Estimated Length of Stay: 14-21 days        Team Interventions: Nursing Interventions Patient/Family Education, Bowel Management, Disease Management/Prevention, Pain Management, Medication Management, Skin Care/Wound Management, Dysphagia/Aspiration Precaution Training  PT interventions Ambulation/gait training, Training and development officer, Discharge planning, Disease management/prevention, Functional mobility training, Patient/family education, Splinting/orthotics, Therapeutic Exercise, Visual/perceptual remediation/compensation, Therapeutic Activities, Skin care/wound management, Functional electrical stimulation, Neuromuscular re-education, DME/adaptive equipment instruction, Stair training, UE/LE Strength taining/ROM, Wheelchair propulsion/positioning  OT Interventions Training and development officer, Community reintegration, Discharge planning, DME/adaptive equipment instruction, Functional electrical stimulation, Functional mobility training, Neuromuscular re-education, Pain management, Patient/family education, Psychosocial support, Self Care/advanced ADL retraining, Skin care/wound managment, Splinting/orthotics, Therapeutic Activities, Therapeutic Exercise, UE/LE Strength taining/ROM, UE/LE Coordination activities, Wheelchair propulsion/positioning  SLP Interventions Cueing hierarchy, Dysphagia/aspiration precaution training, Functional tasks, Patient/family education, Oral motor exercises, Internal/external aids, Environmental controls  TR Interventions    SW/CM Interventions Discharge Planning, Psychosocial Support, Patient/Family Education    Team Discharge Planning: Destination: PT-Home (Home vs SNF pending available support at D/C) ,OT- Home (home vs SNF (depending on caregiver support at home) ) , [place] (SNF) Projected Follow-up: PT-24 hour supervision/assistance, Skilled nursing facility, OT-  Home health OT ([place] vs SNF (depending on caregive support)),  SLP-Other (comment) (TBD) Projected Equipment Needs: PT-To be determined, OT- 3 in 1 bedside comode, Tub/shower bench, SLP-None recommended by SLP Equipment Details: PT- , OT-  Patient/family involved in discharge planning: PT- Patient,  OT-Patient, SLP-Patient  MD ELOS: 14-18d Medical Rehab Prognosis:  Excellent Assessment: 63 y.o. right handed male history of HTN, COPD-oxygen dependent, A fib on Eliquis, s/p MAZE June 2014. He was independent prior to admission, lives alone and was using a walker due to back pain. He was admitted on 04/07/2014 past with left-sided weakness and mild slurred speech. CCT showed a right IC basal ganglia hemorrhage with a 4 mm right to left midline shift and BP 158/85 at admission. Eliquis was reversed with Kcentra and BP monitored closely. Recommendations to repeat CCT in 3-4 weeks to help determine ability to resume Eliquis   Now requiring 24/7 Rehab RN,MD, as well as CIR level PT, OT and SLP.  Treatment team will focus on ADLs and mobility with goals set at Sup  See Team Conference Notes for weekly updates to the plan of care

## 2014-04-16 NOTE — Progress Notes (Signed)
Occupational Therapy Session Note  Patient Details  Name: [name] MRN: [id number] Date of Birth: Nov 07, 1950  Today's Date: 04/16/2014 OT Individual Time: 9233-0076 OT Individual Time Calculation (min): 60 min  and Today's Date: 04/16/2014 OT Co-Treatment Time: 0830-0900 (co-tx with PT) full time 0830-0930 OT Co-Treatment Time Calculation (min): 30 min   Short Term Goals: Week 1:  OT Short Term Goal 1 (Week 1): Patient will perform UB/LB bathing in sit<>stand position with moderate assistance (1 person), using mechanical lift prn OT Short Term Goal 2 (Week 1): Patient will perform UB dressing with min assist while seated unsupported OT Short Term Goal 3 (Week 1): Patient will perform LB dressing with moderate assistance in sit<>stand position (1 person), using mechanical lift prn  OT Short Term Goal 4 (Week 1): Patient will perform toilet transfer using BSC prn with moderate assistance (1 person), using mechanical lift prn OT Short Term Goal 5 (Week 1): Patient will be educated on a strengthening HEP  Skilled Therapeutic Interventions/Progress Updates:    1)Engaged in ADL retraining with focus on bed mobility, sitting balance, and functional use of LUE during self-care tasks of bathing and dressing.  Completed LB bathing at bed level with rolling Rt and Lt with min assist and verbal cues for attention to LUE during rolling.  Utilized bridging to [employer] and brief, noted pt to be incontinent of urine with decreased awareness. Educated on use of LUE with bathing when seated EOB, with improved functional use when visually attending to UE, however continues to lack motor control.  Pt's clothes in laundry unable to dress during this session.  2)Skilled co-treat with PT with focus on functional transfers, sit <> stand, and standing balance to improve participation in self-care tasks.  Clean clothes arrived, pt donned shirt with min verbal cues for sequencing and mod assist, LB +2 assist  for sit <> stand.  Squat pivot transfer +2 with increased time and tactile cues to promote forward weight shift to increase lift off for transfer, 2nd person providing tactile cues at hips with pivot.  In therapy gym, engaged in squat pivot transfer with only assist of 1, while 2nd person stabilized w/c.  Engaged in sit <> stand with tactile cues and blocking at Lt knee as pt tends to bear no weight through LLE in standing.  Progressed to using EVA walker and mirror to promote upright standing, pt benefited from UE support on EVA walker to decrease forward weight shift/tilt in standing.  Therapy Documentation Precautions:  Precautions Precautions: Fall Restrictions Weight Bearing Restrictions: No General:   Vital Signs: Therapy Vitals Pulse Rate: (!) 114 Patient Position (if appropriate): Sitting (Immediately after standing x1.5 minutes) Oxygen Therapy SpO2: 98 % O2 Device: Nasal Cannula O2 Flow Rate (L/min): 2 L/min Pain: Pain Assessment Pain Assessment: FLACC Pain Score: 3  Pain Type: Chronic pain Pain Location: Knee Pain Orientation: Right Pain Descriptors / Indicators: Sharp Pain Onset: Other (Comment) (with standing) Pain Intervention(s): Other (Comment);Repositioned (bracing (Swedish knee cage))  See FIM for current functional status  Therapy/Group: Individual Therapy and Co-Treatment  Shomari Matusik, [facility] 04/16/2014, 12:52 PM

## 2014-04-16 NOTE — Progress Notes (Signed)
63 y.o. right handed male history of HTN, COPD-oxygen dependent, A fib on Eliquis, s/p MAZE June 2014. He was independent prior to admission, lives alone and was using a walker due to back pain.  He was admitted on 04/07/2014 past  with left-sided weakness and mild slurred speech. CCT showed a right IC basal ganglia hemorrhage with a 4 mm right to left midline shift and BP 158/85 at admission. Eliquis was  reversed with Kcentra and BP monitored closely. Recommendations to repeat CCT in 3-4 weeks to help determine ability to resume Eliquis.  Repeat carotid dopplers done revealing 60- 79% L-ICA and 40- 59% R-ICA stenosis   Subjective/Complaints: Pt recalls his D/C date No bowel or bladder issues Chronic O2, no SOB in PT Review of Systems - Negative except on O2 all the time Objective: Vital Signs: Blood pressure 122/66, pulse 108, temperature 98.4 F (36.9 C), temperature source Oral, resp. rate 18, height 5\' 10"  (1.778 m), weight 96.072 kg (211 lb 12.8 oz), SpO2 100 %. No results found. Results for orders placed or performed during the hospital encounter of 04/13/14 (from the past 72 hour(s))  Glucose, capillary     Status: Abnormal   Collection Time: 04/13/14 12:00 PM  Result Value Ref Range   Glucose-Capillary 145 (H) 70 - 99 mg/dL   Comment 1 Notify RN    Comment 2 Documented in Chart      HEENT: normal and O2 [place] Cardio: RRR and murmur Resp: CTA B/L and unlabored GI: BS positive and NT, ND Extremity:  No Edema Skin:   Bruise multiple eccymotic areas in BUE and BLE Neuro: Alert/Oriented, Cranial Nerve II-XII normal, Abnormal Sensory reduced on Left, Abnormal Motor 3/5 LUE, 3- Left HF, 4/5 L KE, 3- L ankle DF and Abnormal FMC Ataxic/ dec FMC Musc/Skel:  Other No swelling or pain with AROM Right knee Gen NAD   Assessment/Plan: 1. Functional deficits secondary to right BG/IC hemorrhage with left hemiparesis and hemisensory loss which require 3+ hours per day of interdisciplinary  therapy in a comprehensive inpatient rehab setting. Physiatrist is providing close team supervision and 24 hour management of active medical problems listed below. Physiatrist and rehab team continue to assess barriers to discharge/monitor patient progress toward functional and medical goals.  FIM: FIM - Bathing Bathing Steps Patient Completed: Chest, Right Arm, Left Arm, Abdomen, Front perineal area, Right upper leg, Left upper leg Bathing: 3: Mod-Patient completes 5-7 30f 10 parts or 50-74% (at bed level)  FIM - Upper Body Dressing/Undressing Upper body dressing/undressing steps patient completed: Thread/unthread right sleeve of pullover shirt/dresss, Put head through opening of pull over shirt/dress Upper body dressing/undressing: 3: Mod-Patient completed 50-74% of tasks FIM - Lower Body Dressing/Undressing Lower body dressing/undressing steps patient completed: Thread/unthread right pants leg Lower body dressing/undressing: 1: Two helpers  FIM - Musician Devices: Grab bar or rail for support Toileting: 1: Two helpers (using STEDY to assist)  FIM - Radio producer Devices: Bedside commode, Grab bars Toilet Transfers: 1-Two helpers, 1-Mechanical lift  FIM - Control and instrumentation engineer Devices: Bed rails, Sliding board Bed/Chair Transfer: 1: Bed > Chair or W/C: Total A (helper does all/Pt. < 25%), 1: Two helpers (+2A from w/c > mat table)  FIM - Locomotion: Wheelchair Distance: 20 Locomotion: Wheelchair: 1: Travels less than 50 ft with moderate assistance (Pt: 50 - 74%) FIM - Locomotion: Ambulation Ambulation/Gait Assistance: 1: +2 Total assist Locomotion: Ambulation: 0: Activity did not occur  Comprehension Comprehension  Mode: Auditory Comprehension: 5-Understands complex 90% of the time/Cues < 10% of the time  Expression Expression Mode: Verbal Expression: 5-Expresses complex 90% of the time/cues < 10% of  the time  Social Interaction Social Interaction Mode: Asleep Social Interaction: 6-Interacts appropriately with others with medication or extra time (anti-anxiety, antidepressant).  Problem Solving Problem Solving: 5-Solves basic problems: With no assist  Memory Memory: 4-Recognizes or recalls 75 - 89% of the time/requires cueing 10 - 24% of the time  Medical Problem List and Plan: 1. Functional deficits secondary to right BG/IC hemorrhage with left hemiparesis and hemisensory loss 2.  DVT Prophylaxis/Anticoagulation: Continue SCDs. Add TEDs. Monitor for any signs of DVT. Check follow up CT head early Jan.   3. Pain Management/chronic back pain: Tylenol as needed for pain. Add voltaren gel. Continue Zanaflex 8 mg twice a day 4. Dysphagia. Dysphagia 3 nectar liquids. Monitor for any signs of aspiration. Follow-up speech therapy 5. Neuropsych: This patient is capable of making decisions on her own behalf. 6. Skin/Wound Care: Routine skin checks 7. Fluids/Electrolytes/Nutrition: Strict I and O's. Follow-up chemistries 8. Hypertension. Monitor every 8 hours. Continue Coreg 12.5 mg twice a day, Aldactone 25 mg daily, Demadex as directed, Zaroxolyn 2.5 mg Monday Wednesday Friday. Monitor with increased mobility 9. Atrial fibrillation/status post Maze procedure June 2014: Eliquis on hold secondary to hemorrhage. Continue Lanoxin 0.25 mg every other day. Monitor HR tid.   10. Hyperlipidemia. Lipitor 11. History of gouty arthritis: Continue Colchicine 0.6 mg twice a day--no signs of flare up pain likely due to compensation for left side. . 12. COPD: Respiratory status stable. Continue Symbicort twice daily. 13. H/o  Peripheral edema with weeping: Controlled due to lack of activity. Continue diuretics and elevate when seated. Low salt diet. Monitor with increase in mobility.   14. Constipation: Will increase senna and augment regimen as needed.    LOS (Days) 3 A FACE TO FACE EVALUATION WAS  PERFORMED  Sargent Mankey E 04/16/2014, 6:16 AM

## 2014-04-17 NOTE — Progress Notes (Signed)
63 y.o. right handed male history of HTN, COPD-oxygen dependent, A fib on Eliquis, s/p MAZE June 2014. He was independent prior to admission, lives alone and was using a walker due to back pain.  He was admitted on 04/07/2014 past  with left-sided weakness and mild slurred speech. CCT showed a right IC basal ganglia hemorrhage with a 4 mm right to left midline shift and BP 158/85 at admission. Eliquis was  reversed with Kcentra and BP monitored closely. Recommendations to repeat CCT in 3-4 weeks to help determine ability to resume Eliquis.  Repeat carotid dopplers done revealing 60- 79% L-ICA and 40- 59% R-ICA stenosis   Subjective/Complaints: Large loose incont stool.  "thought I was having gas"  No nausea or vomiting Review of Systems - Negative except on O2 all the time Objective: Vital Signs: Blood pressure 105/64, pulse 104, temperature 97.9 F (36.6 C), temperature source Oral, resp. rate 18, height 5\' 10"  (1.778 m), weight 96.072 kg (211 lb 12.8 oz), SpO2 96 %. No results found. No results found for this or any previous visit (from the past 72 hour(s)).   HEENT: normal and O2 [place] Cardio: RRR and murmur Resp: CTA B/L and unlabored GI: BS positive and NT, ND Extremity:  No Edema Skin:   Bruise multiple eccymotic areas in BUE and BLE Neuro: Alert/Oriented, Cranial Nerve II-XII normal, Abnormal Sensory reduced on Left, Abnormal Motor 3/5 LUE, 3- Left HF, 4/5 L KE, 3- L ankle DF and Abnormal FMC Ataxic/ dec FMC Musc/Skel:  Other No swelling or pain with AROM Right knee Gen NAD   Assessment/Plan: 1. Functional deficits secondary to right BG/IC hemorrhage with left hemiparesis and hemisensory loss which require 3+ hours per day of interdisciplinary therapy in a comprehensive inpatient rehab setting. Physiatrist is providing close team supervision and 24 hour management of active medical problems listed below. Physiatrist and rehab team continue to assess barriers to discharge/monitor  patient progress toward functional and medical goals.  FIM: FIM - Bathing Bathing Steps Patient Completed: Chest, Right Arm, Left Arm, Abdomen, Front perineal area, Right upper leg, Left upper leg Bathing: 3: Mod-Patient completes 5-7 30f 10 parts or 50-74%  FIM - Upper Body Dressing/Undressing Upper body dressing/undressing steps patient completed: Thread/unthread right sleeve of pullover shirt/dresss, Put head through opening of pull over shirt/dress Upper body dressing/undressing: 3: Mod-Patient completed 50-74% of tasks FIM - Lower Body Dressing/Undressing Lower body dressing/undressing steps patient completed: Thread/unthread right pants leg Lower body dressing/undressing: 1: Two helpers  FIM - Toileting Toileting steps completed by patient: Adjust clothing prior to toileting, Performs perineal hygiene, Adjust clothing after toileting Toileting Assistive Devices: Grab bar or rail for support Toileting: 5: Set-up assist to: Obtain supplies  FIM - Radio producer Devices: Bedside commode, Grab bars Toilet Transfers: 1-Two helpers, 1-Mechanical lift  FIM - Control and instrumentation engineer Devices: Bed rails Bed/Chair Transfer: 3: Supine > Sit: Mod A (lifting assist/Pt. 50-74%/lift 2 legs  FIM - Locomotion: Wheelchair Distance: 20 Locomotion: Wheelchair: 1: Total Assistance/staff pushes wheelchair (Pt<25%) FIM - Locomotion: Ambulation Ambulation/Gait Assistance: 1: +2 Total assist Locomotion: Ambulation: 0: Activity did not occur  Comprehension Comprehension Mode: Auditory Comprehension: 5-Understands complex 90% of the time/Cues < 10% of the time  Expression Expression Mode: Verbal Expression: 5-Expresses complex 90% of the time/cues < 10% of the time  Social Interaction Social Interaction Mode: Asleep Social Interaction: 6-Interacts appropriately with others with medication or extra time (anti-anxiety,  antidepressant).  Problem Solving Problem Solving: 5-Solves  basic 90% of the time/requires cueing < 10% of the time  Memory Memory: 5-Recognizes or recalls 90% of the time/requires cueing < 10% of the time  Medical Problem List and Plan: 1. Functional deficits secondary to right BG/IC hemorrhage with left hemiparesis and hemisensory loss 2.  DVT Prophylaxis/Anticoagulation: Continue SCDs. Add TEDs. Monitor for any signs of DVT. Check follow up CT head early Jan.   3. Pain Management/chronic back pain: Tylenol as needed for pain. Add voltaren gel. Continue Zanaflex 8 mg twice a day 4. Dysphagia. Dysphagia 3 nectar liquids. Monitor for any signs of aspiration. Follow-up speech therapy 5. Neuropsych: This patient is capable of making decisions on her own behalf. 6. Skin/Wound Care: Routine skin checks 7. Fluids/Electrolytes/Nutrition: Strict I and O's. Follow-up chemistries 8. Hypertension. Monitor every 8 hours. Continue Coreg 12.5 mg twice a day, Aldactone 25 mg daily, Demadex as directed, Zaroxolyn 2.5 mg Monday Wednesday Friday. Monitor with increased mobility 9. Atrial fibrillation/status post Maze procedure June 2014: Eliquis on hold secondary to hemorrhage. Continue Lanoxin 0.25 mg every other day. Monitor HR tid.   10. Hyperlipidemia. Lipitor 11. History of gouty arthritis: Continue Colchicine 0.6 mg twice a day--no signs of flare up pain likely due to compensation for left side. . 12. COPD: Respiratory status stable. Continue Symbicort twice daily. 13. H/o  Peripheral edema with weeping: Controlled due to lack of activity. Continue diuretics and elevate when seated. Low salt diet. Monitor with increase in mobility.   14. Constipation: Now incont of stool. Will reduced senna   LOS (Days) 4 A FACE TO FACE EVALUATION WAS PERFORMED  Aleks Nawrot E 04/17/2014, 8:46 AM

## 2014-04-17 NOTE — Plan of Care (Signed)
Problem: RH BOWEL ELIMINATION Goal: RH STG MANAGE BOWEL W/MEDICATION W/ASSISTANCE STG Manage Bowel with Medication with mod I Assistance.  Outcome: Not Progressing incont required staff to clean clothes and bed linen

## 2014-04-18 ENCOUNTER — Inpatient Hospital Stay (HOSPITAL_COMMUNITY): Payer: Self-pay | Admitting: Speech Pathology

## 2014-04-18 ENCOUNTER — Inpatient Hospital Stay (HOSPITAL_COMMUNITY): Payer: Self-pay | Admitting: Physical Therapy

## 2014-04-18 ENCOUNTER — Inpatient Hospital Stay (HOSPITAL_COMMUNITY): Payer: BLUE CROSS/BLUE SHIELD | Admitting: Occupational Therapy

## 2014-04-18 ENCOUNTER — Inpatient Hospital Stay (HOSPITAL_COMMUNITY): Payer: BLUE CROSS/BLUE SHIELD

## 2014-04-18 NOTE — Progress Notes (Signed)
Speech Language Pathology Daily Session Note  Patient Details  Name: [name] MRN: [id number] Date of Birth: May 12, 1950  Today's Date: 04/18/2014 SLP Individual Time: 2202-5427 SLP Individual Time Calculation (min): 30 min  Short Term Goals: Week 1: SLP Short Term Goal 1 (Week 1): Pt will tolerate presentations of his currently prescribed diet with no overt s/s of aspiration and mod I use of swallowing precautions.  SLP Short Term Goal 2 (Week 1): Pt will tolerate trials of upgraded consistencies with no overt s/s of aspiration and mod I use of swallowing precautions.  SLP Short Term Goal 3 (Week 1): Pt will complete oral motor exercises targeting left sided weakness to improve mastication and manipulation of regular solid boluses with supervision.   Skilled Therapeutic Interventions:  Pt was seen for skilled ST targeting swallowing goals.  Upon arrival, pt was seated upright in wheelchair, awake, alert, and agreeable to participate in ST.  SLP facilitated the session with trials of regular water via cup sips per the water protocol with pt exhibiting no overt s/s of aspiration.  Per report, pt has been regularly consuming a great deal of water while following the parameters of the water protocol with no s/s of aspiration indicated on the log of trials posted above the pt's bed over the last 48 hours since water protocol was initiated.  Therefore, SLP initiated trials of mixed solid and thin liquid consistencies to continue working towards liquids advancement.  No overt s/s of aspiration were noted with the abovementioned trials; however, pt was only observed with very small amounts of both solid and liquid consistencies due to pt's poor appetite.  SLP recommends follow up at next available appointment for continued trials of solid and thin liquid consistencies in the setting of a functional meal.  Continue per current plan of care.    FIM:  Comprehension Comprehension Mode:  Auditory Comprehension: 5-Follows basic conversation/direction: With no assist Expression Expression Mode: Verbal Expression: 5-Expresses complex 90% of the time/cues < 10% of the time Social Interaction Social Interaction: 6-Interacts appropriately with others with medication or extra time (anti-anxiety, antidepressant). Problem Solving Problem Solving: 5-Solves complex 90% of the time/cues < 10% of the time Memory Memory: 4-Recognizes or recalls 75 - 89% of the time/requires cueing 10 - 24% of the time FIM - Eating Eating Activity: 5: Supervision/cues  Pain Pain Assessment Pain Assessment: No/denies pain   Therapy/Group: Individual Therapy  Cricket Goodlin, Selinda Orion 04/18/2014, 3:33 PM

## 2014-04-18 NOTE — Plan of Care (Signed)
Problem: RH SKIN INTEGRITY Goal: RH STG ABLE TO PERFORM INCISION/WOUND CARE W/ASSISTANCE STG Able To Perform Incision/Wound Care With total Assistance from caregiver.  Outcome: Not Applicable Date Met:  20/81/38 Pt has foam dressing and transparent dressing to L  leg that staff apply.

## 2014-04-18 NOTE — Progress Notes (Signed)
Physical Therapy Session Note  Patient Details  Name: [name] MRN: [id number] Date of Birth: 01/19/1951  Today's Date: 04/18/2014 PT Individual Time: 1510-1540 PT Individual Time Calculation (min): 30 min   Short Term Goals: Week 1:  PT Short Term Goal 1 (Week 1): Pt will perform bed mobility with mod A with HOB flat using rail. PT Short Term Goal 2 (Week 1): Pt will transfer from bed<>w/c with max A of single therapist. PT Short Term Goal 3 (Week 1): Pt will perform w/c mobility x50' with min A and 25% cueing. PT Short Term Goal 4 (Week 1): Pt will ambulate x25' wth Total A of single therapist (and w/c follow for safety, if needed). PT Short Term Goal 5 (Week 1): Pt will perform static standing with single UE support requiring min A and 25% cueing for midline orientation.  Skilled Therapeutic Interventions/Progress Updates:  Pt appeared very fatigued, sitting up in w/c.    W/c> bed to R requiring 2 attempts for squat pivot transfer, max cues.  Sit> supine with mod assist.  Scooting toward [facility] with max assist after set-up.  neuromuscular re-education via manual cues, VCs, for: - pelvic dissociation for L pelvic retraction/protraction in sitting in R lateral lean on bed -in supine, bil bridging without use of UEs; bil lower trunk rotation while adducting to hold towel roll between knees, bil shoulder protraction, alternating L and R slow ankle pumps, isolated L hip abduction, L short arc quad extension--all 10 x 1  -bil bridging with concurrent bil hip adduction for core activation, 5 x 1    Pt demonstrated difficulty with motor planning for all functional mobility.  LLE isolated movements out of synergy improved after multiple reps of passive movement by PT, then pt able to isolate better. Therapy Documentation Precautions:  Precautions Precautions: Fall Restrictions Weight Bearing Restrictions: No   Vital Signs: SpO2: 100 % O2 Device: [place] 3L  Pain: Pain Assessment Pain  Assessment: No/denies pain Pain Score: 3  Pain Location: Leg Pain Orientation: Upper;Medial Pain Onset: With Activity Pain Intervention(s): Rest    See FIM for current functional status  Therapy/Group: Individual Therapy  Cleve Paolillo 04/18/2014, 4:00 PM

## 2014-04-18 NOTE — Progress Notes (Signed)
63 y.o. right handed male history of HTN, COPD-oxygen dependent, A fib on Eliquis, s/p MAZE June 2014. He was independent prior to admission, lives alone and was using a walker due to back pain.  He was admitted on 04/07/2014 past  with left-sided weakness and mild slurred speech. CCT showed a right IC basal ganglia hemorrhage with a 4 mm right to left midline shift and BP 158/85 at admission. Eliquis was  reversed with Kcentra and BP monitored closely. Recommendations to repeat CCT in 3-4 weeks to help determine ability to resume Eliquis.  Repeat carotid dopplers done revealing 60- 79% L-ICA and 40- 59% R-ICA stenosis (Above per history and physical).   Subjective/Complaints: Patient denies complaints. He is working with therapy. Objective: Vital Signs: Blood pressure 103/55, pulse 98, temperature 97.5 F (36.4 C), temperature source Oral, resp. rate 18, height 5\' 10"  (1.778 m), weight 211 lb 12.8 oz (96.072 kg), SpO2 97 %.   No acute distress. Chest clear to auscultation. Cardiac exam S1 and S2 are regular. Abdominal exam active bowel sounds, soft. Extremities, lower with 1+ edema  Assessment/Plan: 1. Functional deficits secondary to right BG/IC hemorrhage with left hemiparesis and hemisensory loss   Medical Problem List and Plan: 1. Functional deficits secondary to right BG/IC hemorrhage with left hemiparesis and hemisensory loss 2.  DVT Prophylaxis/Anticoagulation: SCDs.  3. Pain Management/chronic back pain: Controlled adequately. 4. Dysphagia. Dysphagia 3 nectar liquids. Monitor for any signs of aspiration. Follow-up speech therapy 5. Neuropsych: This patient is capable of making decisions on her own behalf. 6. Skin/Wound Care: Routine skin checks 7. Fluids/Electrolytes/Nutrition: Strict I and O' Basic Metabolic Panel:    Component Value Date/Time   NA 141 04/09/2014 0210   K 3.9 04/09/2014 0210   CL 100 04/09/2014 0210   CO2 31 04/09/2014 0210   BUN 18 04/09/2014 0210   CREATININE 1.35 04/09/2014 0210   CREATININE 1.38* 12/31/2013 1100   GLUCOSE 128* 04/09/2014 0210   CALCIUM 8.9 04/09/2014 0210   8. Hypertension.  Blood pressure ranged from 108-114/59- 80 .  Currently on carvedilol 12-1/2 mg twice a day, metolazone 2-1/2 mg every Monday Wednesday and Friday, spironolactone 25 mg daily, torsemide 20 mg daily in the evening and torsemide 40 mg with breakfast 9. Atrial fibrillation/status post Maze procedure June 2014: Eliquis on hold secondary to hemorrhage. Continue Lanoxin 0.25 mg every other day. Monitor HR tid.   10. Hyperlipidemia. Lipitor 11. History of gouty arthritis: . 12. COPD: Respiratory status stable. Continue Symbicort twice daily. 13. H/o  Peripheral edema with weeping: Controlled due to lack of activity. Continue diuretics and elevate when seated. Low salt diet. Monitor with increase in mobility.   14. Constipation: Now incont of stool. Will reduced senna   LOS (Days) 5 A FACE TO FACE EVALUATION WAS PERFORMED  [name] [name] 04/18/2014, 9:23 AM

## 2014-04-18 NOTE — Progress Notes (Signed)
Physical Therapy Session Note  Patient Details  Name: [name] MRN: [id number] Date of Birth: 1951-02-04  Today's Date: 04/18/2014 PT Individual Time: 1001-1101 PT Individual Time Calculation (min): 60 min   Short Term Goals: Week 1:  PT Short Term Goal 1 (Week 1): Pt will perform bed mobility with mod A with HOB flat using rail. PT Short Term Goal 2 (Week 1): Pt will transfer from bed<>w/c with max A of single therapist. PT Short Term Goal 3 (Week 1): Pt will perform w/c mobility x50' with min A and 25% cueing. PT Short Term Goal 4 (Week 1): Pt will ambulate x25' wth Total A of single therapist (and w/c follow for safety, if needed). PT Short Term Goal 5 (Week 1): Pt will perform static standing with single UE support requiring min A and 25% cueing for midline orientation.    Precautions:  Precautions Precautions: Fall Restrictions Weight Bearing Restrictions: No    Locomotion : Wheelchair Mobility Distance:  (55 feet)   Sit to and from stand transfer with RW max assist right Swedish knee caged donned.  Patient stood two trials for 90 seconds and 60 seconds respectively. With RW and right Swedish knee caged donned and max assist.  Squat pivot transfer performed to and from wheelchair and mat max assist.   Lateral scooting on mat to left and right max assist  Tai Chi for approximately 10 minutes focusing on endurance, coordination and breathing.  Short sit to and from supine on mat max assist.  Supine ther ex: Bridging 30x with a 5 second hold  Wheelchair propulsion 55 feet mod assist for turns and to maintain straight trajectory.    Patient remained on 2LO2 via nasal cannula throughout session. Vitals monitored and remained stable throughout session and responded appropriately with activity. Patient required frequent and prolonged rest breaks throughout session. Patient with decreased overall endurance.  Patient returned to room at end of session with all needs  met and call bell within reach. Patient educated not to be up without assistance. Patient verbalized understanding. Plan of care to address, safety, functional mobility, balance, endurance, and strengthening.   Therapy/Group: Individual Therapy  Retta Diones 04/18/2014, 4:13 PM

## 2014-04-18 NOTE — Progress Notes (Signed)
Occupational Therapy Session Note  Patient Details  Name: [name] MRN: [id number] Date of Birth: January 06, 1951  Today's Date: 04/18/2014 OT Individual Time: 0700-0800 OT Individual Time Calculation (min): 60 min    Short Term Goals: Week 1:  OT Short Term Goal 1 (Week 1): Patient will perform UB/LB bathing in sit<>stand position with moderate assistance (1 person), using mechanical lift prn OT Short Term Goal 2 (Week 1): Patient will perform UB dressing with min assist while seated unsupported OT Short Term Goal 3 (Week 1): Patient will perform LB dressing with moderate assistance in sit<>stand position (1 person), using mechanical lift prn  OT Short Term Goal 4 (Week 1): Patient will perform toilet transfer using BSC prn with moderate assistance (1 person), using mechanical lift prn OT Short Term Goal 5 (Week 1): Patient will be educated on a strengthening HEP  Skilled Therapeutic Interventions/Progress Updates:    Engaged in ADL retraining with focus on bed mobility, sitting balance, functional use of LUE, and transfers.  Pt received awake and alert at bed level, requesting water.  Performed oral care with setup assist to complete sips of water via water protocol.  Pt with approx 8 oz water with no concerns.  LB bathing and dressing completed at bed level with pt completing rolling with min cues for sequencing and attention to LUE.  Noted pt to be incontinent of loose stool, with pt reporting aware that he had to go and had gone on [facility] earlier but unaware of this incontinent episode.  Total assist for LB hygiene at bed level this session due to incontinence.  Donned shorts at bed level with bridging at hips and rolling with total assist from therapist.  UB bathing and dressing completed at EOB with pt with carryover of use of LUE during bathing with min verbal cues to visually attend to UE to improve motor control.  Squat pivot max assist to Lt with RN present to stabilize w/c.  Tactile  cues to promote anterior weight shift to improve control with transfer.    Of note, pt appeared down this session.  Reporting " a little depressed, I guess."  Encouragement and rest breaks provided throughout session.  Therapy Documentation Precautions:  Precautions Precautions: Fall Restrictions Weight Bearing Restrictions: No General:   Vital Signs: Therapy Vitals Temp: 97.5 F (36.4 C) Temp Source: Oral Pulse Rate: (!) 102 Resp: 18 BP: (!) 108/59 mmHg Patient Position (if appropriate): Lying Oxygen Therapy SpO2: 97 % O2 Device: Nasal Cannula O2 Flow Rate (L/min): 2 L/min Pain:  Pt with no c/o pain  See FIM for current functional status  Therapy/Group: Individual Therapy  Simonne Come 04/18/2014, 8:18 AM

## 2014-04-19 ENCOUNTER — Inpatient Hospital Stay (HOSPITAL_COMMUNITY): Payer: Self-pay | Admitting: Occupational Therapy

## 2014-04-19 ENCOUNTER — Encounter (HOSPITAL_COMMUNITY): Payer: Self-pay | Admitting: Occupational Therapy

## 2014-04-19 ENCOUNTER — Inpatient Hospital Stay (HOSPITAL_COMMUNITY): Payer: Self-pay | Admitting: Rehabilitation

## 2014-04-19 ENCOUNTER — Inpatient Hospital Stay (HOSPITAL_COMMUNITY): Payer: BC Managed Care – PPO | Admitting: Rehabilitation

## 2014-04-19 NOTE — Progress Notes (Signed)
Poor night sleep. PRN trazodone 25mg  given at 2237, patient reluctant to take 50mg . Reports having a lot on his mind.  Held scheduled senna s, 4 loose incontinent BM's. Refused colchicine, "the gel works fine."  Fell asleep 0330 after turned onto side. [name]

## 2014-04-19 NOTE — Progress Notes (Signed)
63 y.o. right handed male history of HTN, COPD-oxygen dependent, A fib on Eliquis, s/p MAZE June 2014. He was independent prior to admission, lives alone and was using a walker due to back pain.  He was admitted on 04/07/2014 past  with left-sided weakness and mild slurred speech. CCT showed a right IC basal ganglia hemorrhage with a 4 mm right to left midline shift and BP 158/85 at admission. Eliquis was  reversed with Kcentra and BP monitored closely. Recommendations to repeat CCT in 3-4 weeks to help determine ability to resume Eliquis.  Repeat carotid dopplers done revealing 60- 79% L-ICA and 40- 59% R-ICA stenosis (Above per history and physical).   Subjective/Complaints: Patient feels well. He denies complaints. He thinks that therapy is making him stronger.  Objective: Vital Signs: Blood pressure 115/70, pulse 107, temperature 98.1 F (36.7 C), temperature source Oral, resp. rate 16, height 5\' 10"  (1.778 m), weight 211 lb 12.8 oz (96.072 kg), SpO2 96 %.   Elderly male in no acute distress. Chest clear to auscultation. Cardiac exam S1 and S2 are regular. Abdominal exam overweight, bowel sounds, soft. Extremities with 1+  pretibial edema  Assessment/Plan: 1. Functional deficits secondary to right BG/IC hemorrhage with left hemiparesis and hemisensory loss   Medical Problem List and Plan: 1. Functional deficits secondary to right BG/IC hemorrhage with left hemiparesis and hemisensory loss 2.  DVT Prophylaxis/Anticoagulation: SCDs.  3. Pain Management/chronic back pain: Controlled adequately. 4. Dysphagia. Dysphagia 3 nectar liquids. Monitor for any signs of aspiration. Follow-up speech therapy 5. Neuropsych: This patient is capable of making decisions on her own behalf. 6. Skin/Wound Care: Routine skin checks 7. Fluids/Electrolytes/Nutrition: Strict I and O' Basic Metabolic Panel:    Component Value Date/Time   NA 141 04/09/2014 0210   K 3.9 04/09/2014 0210   CL 100 04/09/2014 0210    CO2 31 04/09/2014 0210   BUN 18 04/09/2014 0210   CREATININE 1.35 04/09/2014 0210   CREATININE 1.38* 12/31/2013 1100   GLUCOSE 128* 04/09/2014 0210   CALCIUM 8.9 04/09/2014 0210   8. Hypertension.  Blood pressure ranged from 98/68-120/75 Currently on carvedilol 12-1/2 mg twice a day, metolazone 2-1/2 mg every Monday Wednesday and Friday, spironolactone 25 mg daily, torsemide 20 mg daily in the evening and torsemide 40 mg with breakfast 9. Atrial fibrillation/status post Maze procedure June 2014: Eliquis on hold secondary to hemorrhage. Continue Lanoxin 0.25 mg every other day. Monitor HR tid.   10. Hyperlipidemia. Lipitor 11. History of gouty arthritis: . 12. COPD: Respiratory status stable. Continue Symbicort twice daily. 13. H/o  Peripheral edema with weeping: Controlled due to lack of activity. Continue diuretics and elevate when seated. Low salt diet. Monitor with increase in mobility.   14. Constipation: Now incont of stool. Will reduced senna   LOS (Days) 6 A FACE TO FACE EVALUATION WAS PERFORMED  SWORDS,BRUCE HENRY 04/19/2014, 7:49 AM

## 2014-04-19 NOTE — Progress Notes (Signed)
Occupational Therapy Session Note  Patient Details  Name: [name] MRN: [id number] Date of Birth: May 22, 1950  Today's Date: 04/19/2014 OT Individual Time: 2336-1224 OT Individual Time Calculation (min): 38 min    Skilled Therapeutic Interventions/Progress Updates: Moist heat pack was applied to patient's painful left shoulder for approximately 25 minutes.  Attempted UE arm bike in gym this afternoon, but patient complained of 4/10 dull constant aching in his right shoulder, even when sitting still. As well, he c/o that he was tired because he'd only slept about 2.5 hours the night before due to having many BMs.   So, he stopped 'pedaling.'   He had denied offers for pain meds earlier but requested pain med and was taken back to his room.  After taken back to his room, Patient requested pain meds and RN alerted      Therapy Documentation Precautions:  Precautions Precautions: Fall Restrictions Weight Bearing Restrictions: No  Pain: 4/10 constant dull pain in left shoulder.  RN informed that patient wanted pain meds.  See FIM for current functional status  Therapy/Group: Individual Therapy  Alfredia Ferguson [facility] 04/19/2014, 5:01 PM

## 2014-04-19 NOTE — Progress Notes (Signed)
Occupational Therapy Session Note  Patient Details  Name: [name] MRN: [id number] Date of Birth: 1950-05-29  Today's Date: 04/19/2014 OT Individual Time: 0930-1030 OT Individual Time Calculation (min): 60 min    Skilled Therapeutic Interventions/Progress Updates: patient scheduled for bathing but already dressed.  He agreed to work on donning and doffing socks and shoes (mod A and use of stool).   He c/o of left shoulder but refused meds stating he did not want to get addicted to them.   As well, he dosed during the session and stated he slept from about 3:30til 5:30 last night due to having to use the bed pan and that he was very tired today.   He was able to complete chair to bed transfer with mod A and bed positionign with mod A      Therapy Documentation Precautions:  Precautions Precautions: Fall Restrictions Weight Bearing Restrictions: No  Pain: He denied need for pain meds when asked Pain Assessment Pain Assessment: 0-10 Pain Score: 3  Pain Type: Acute pain Pain Location: Shoulder Pain Orientation: Left Pain Descriptors / Indicators: Aching Pain Onset: Gradual Pain Intervention(s): Medication (See eMAR)  See FIM for current functional status  Therapy/Group: Individual Therapy  Alfredia Ferguson [facility] 04/19/2014, 3:50 PM

## 2014-04-19 NOTE — Progress Notes (Signed)
Physical Therapy Session Note  Patient Details  Name: [name] MRN: [id number] Date of Birth: [birth year]  Today's Date: 04/19/2014 PT Individual Time: 0800-0904 PT Individual Time Calculation (min): 64 min   Short Term Goals: Week 1:  PT Short Term Goal 1 (Week 1): Pt will perform bed mobility with mod A with HOB flat using rail. PT Short Term Goal 2 (Week 1): Pt will transfer from bed<>w/c with max A of single therapist. PT Short Term Goal 3 (Week 1): Pt will perform w/c mobility x50' with min A and 25% cueing. PT Short Term Goal 4 (Week 1): Pt will ambulate x25' wth Total A of single therapist (and w/c follow for safety, if needed). PT Short Term Goal 5 (Week 1): Pt will perform static standing with single UE support requiring min A and 25% cueing for midline orientation.  Skilled Therapeutic Interventions/Progress Updates:   Pt received lying in bed, agreeable to therapy session.  Note that pt without pants/shorts donned, therefore assisted and upon donning shorts, noted pt to be incontinent of small amount of bowel and also bladder.  Assisted with peri care and donning new brief and shorts, however pt able to perform rolling R and L several times at S to min A, esp to the R for coordinated movements with LUE.  Transferred to EOB with mod A.  Assisted with donning TEDs and shoes prior to transfer.   Performed squat pivot transfer at mod A level.  Pt did very well with forward weight shift and elevating buttocks for transfer into chair with cues for safety of LUE and placement of LLE during transfer.  Skilled session focused on w/c mobility for LE coordination and NMR, as well as functional transfers to/from nustep and seated nustep for increased proprioceptive feedback and coordination.  Performed w/c mobility x 60' x 1 and another 30' x 1 with mod A and demonstration as well as verbal/tactile cues for sequencing.  Performed seated nustep x 5 mins at level 2 resistance with BLEs only.   Again, requires tactile and verbal cues for maintaining alignment and adequate push for LLE.  Note pt very fatigued and needed many rest breaks despite slow speed of propulsion on nustep.  Pt maintained SaO2 in upper 90's throughout on 2L supplemental oxygen.  Pt left in room in w/c with lap tray donned.  All needs in reach.  Given 3oz water following oral care per water protocol, see sheet in room.    Therapy Documentation Precautions:  Precautions Precautions: Fall Restrictions Weight Bearing Restrictions: No   Vital Signs: Therapy Vitals Temp: 98.1 F (36.7 C) Temp Source: Oral Pulse Rate: (!) 107 Resp: 16 BP: 115/70 mmHg Patient Position (if appropriate): Lying Oxygen Therapy SpO2: 96 % O2 Device: Nasal Cannula Pain: Pain Assessment Pain Assessment: No/denies pain  See FIM for current functional status  Therapy/Group: Individual Therapy  Denice Bors 04/19/2014, 8:40 AM

## 2014-04-19 NOTE — Progress Notes (Signed)
Physical Therapy Session Note  Patient Details  Name: [name] MRN: [id number] Date of Birth: 1951-02-25  Today's Date: 04/19/2014 PT Individual Time: 1300-1330 PT Individual Time Calculation (min): 30 min   Short Term Goals: Week 1:  PT Short Term Goal 1 (Week 1): Pt will perform bed mobility with mod A with HOB flat using rail. PT Short Term Goal 2 (Week 1): Pt will transfer from bed<>w/c with max A of single therapist. PT Short Term Goal 3 (Week 1): Pt will perform w/c mobility x50' with min A and 25% cueing. PT Short Term Goal 4 (Week 1): Pt will ambulate x25' wth Total A of single therapist (and w/c follow for safety, if needed). PT Short Term Goal 5 (Week 1): Pt will perform static standing with single UE support requiring min A and 25% cueing for midline orientation.  Skilled Therapeutic Interventions/Progress Updates:   Pt received lying in bed, directly on L arm and L side, requesting to get up and drink water.  Assisted pt back into supine position then performed bridging x 3 reps to scoot hips laterally in bed to prepare for rolling to the R.  Performed bed mobility at mod A level as mentioned in previous note with cues for sequencing and technique for using UEs to push in slow controlled manner.  Transferred to w/c via squat pivot at mod A level.  Max verbal cues for safety as he tends to transfer very fast with little regards to LUE, tending to get it caught under him.  Once in w/c, pt requesting to perform oral care in order to drink water.  Encouraged pt to propel over to sink and attempt to stand to brush teeth.  Pt very impulsive to stand, but does not provide enough forward weight shift and tends to keep R hand held tightly to chair, therefore unable to stand all the way.  Attempted again, however with same circumstances and max A for attempt.  Pt with heavy L lateral lean/pushing to the L.  Pt brushed teeth in sitting and then drank small amount of water with no s/s of  aspiration.  Then assisted pt to therapy gym in order to perform 3 reps of standing with +2 assist ("three muskateer style") with use of mirror for visual feedback and R knee cage.  Pt with very limited endurance and was only able to stand x 15-20 secs each time.  Pt assisted back to room and left in w/c with all needs in reach and quick release belt donned.   Therapy Documentation Precautions:  Precautions Precautions: Fall Restrictions Weight Bearing Restrictions: No   Vital Signs: Therapy Vitals Pulse Rate: 100 Pain: Pt with no reports of pain during session.    Locomotion : Wheelchair Mobility Distance: 60   See FIM for current functional status  Therapy/Group: Individual Therapy  Denice Bors 04/19/2014, 12:48 PM

## 2014-04-20 ENCOUNTER — Inpatient Hospital Stay (HOSPITAL_COMMUNITY): Payer: BLUE CROSS/BLUE SHIELD | Admitting: Physical Therapy

## 2014-04-20 ENCOUNTER — Inpatient Hospital Stay (HOSPITAL_COMMUNITY): Payer: Self-pay | Admitting: Speech Pathology

## 2014-04-20 ENCOUNTER — Inpatient Hospital Stay (HOSPITAL_COMMUNITY): Payer: Self-pay | Admitting: Rehabilitation

## 2014-04-20 MED ORDER — MIRTAZAPINE 7.5 MG PO TABS
7.5000 mg | ORAL_TABLET | Freq: Every day | ORAL | Status: DC
  Administered 2014-04-20 – 2014-05-04 (×15): 7.5 mg via ORAL
  Filled 2014-04-20 (×16): qty 1

## 2014-04-20 MED ORDER — TIZANIDINE HCL 4 MG PO TABS
8.0000 mg | ORAL_TABLET | Freq: Two times a day (BID) | ORAL | Status: DC
  Administered 2014-04-20 – 2014-04-29 (×18): 8 mg via ORAL
  Filled 2014-04-20 (×21): qty 2

## 2014-04-20 MED ORDER — MUSCLE RUB 10-15 % EX CREA
TOPICAL_CREAM | Freq: Three times a day (TID) | CUTANEOUS | Status: DC
  Administered 2014-04-20 – 2014-05-05 (×43): via TOPICAL
  Filled 2014-04-20: qty 85

## 2014-04-20 MED ORDER — TIZANIDINE HCL 4 MG PO TABS
4.0000 mg | ORAL_TABLET | Freq: Three times a day (TID) | ORAL | Status: DC
  Filled 2014-04-20: qty 1

## 2014-04-20 MED ORDER — TRAMADOL HCL 50 MG PO TABS
50.0000 mg | ORAL_TABLET | Freq: Four times a day (QID) | ORAL | Status: DC | PRN
  Administered 2014-04-21 – 2014-05-05 (×32): 50 mg via ORAL
  Filled 2014-04-20 (×32): qty 1

## 2014-04-20 MED ORDER — BOOST PLUS PO LIQD
237.0000 mL | Freq: Three times a day (TID) | ORAL | Status: DC
  Administered 2014-04-20 – 2014-04-21 (×3): 237 mL via ORAL
  Filled 2014-04-20 (×7): qty 237

## 2014-04-20 MED ORDER — ESCITALOPRAM OXALATE 5 MG PO TABS
5.0000 mg | ORAL_TABLET | Freq: Every day | ORAL | Status: DC
  Filled 2014-04-20: qty 1

## 2014-04-20 MED ORDER — TROLAMINE SALICYLATE 10 % EX CREA
TOPICAL_CREAM | Freq: Three times a day (TID) | CUTANEOUS | Status: DC
  Filled 2014-04-20: qty 85

## 2014-04-20 NOTE — Progress Notes (Signed)
Speech Language Pathology Weekly Progress and Session Note  Patient Details  Name: [name] MRN: [id number] Date of Birth: October 13, 1950  Beginning of progress report period: April 14, 2014 End of progress report period: April 20, 2014  Today's Date: 04/20/2014 SLP Individual Time: 1301-1401 SLP Individual Time Calculation (min): 60 min  Short Term Goals: Week 1: SLP Short Term Goal 1 (Week 1): Pt will tolerate presentations of his currently prescribed diet with no overt s/s of aspiration and mod I use of swallowing precautions.  SLP Short Term Goal 2 (Week 1): Pt will tolerate trials of upgraded consistencies with no overt s/s of aspiration and mod I use of swallowing precautions.  SLP Short Term Goal 2 - Progress (Week 1): Met SLP Short Term Goal 3 (Week 1): Pt will complete oral motor exercises targeting left sided weakness to improve mastication and manipulation of regular solid boluses with supervision.  SLP Short Term Goal 3 - Progress (Week 1): Other (comment) (not targeted this reporting period )    New Short Term Goals: Week 2: SLP Short Term Goal 1 (Week 2): Pt will demonstrate improved mastication for toleration of regular textures with Mod I  SLP Short Term Goal 2 (Week 2): Pt will tolerate dys 3 solids and thin liquids with no overt s/s of aspiration and Mod I use of swallowing precautions.  SLP Short Term Goal 3 (Week 2): Pt will complete oral motor exercises targeting left sided weakness to improve mastication and manipulation of regular solid boluses with supervision.  SLP Short Term Goal 4 (Week 2): Pt will improve recall of daily information via use of compensatory strategies over 80% of observable opportunities with supervision cues.  SLP Short Term Goal 5 (Week 2): Pt will improve functional problem solving skills (i.e. planning, thought organization, and error awareness) during tasks over 80% of observable opportunities with supervision cues.   Weekly  Progress Updates:  Pt made functional gains this reporting period and has met 2 out of 3 short term goals.  Pt was upgraded to thin liquids today with continued dys 3 solids due to good tolerance of trials of regular water per the water protocol.  SLP will continue to monitor closely and follow up for diet tolerance.  Pt was also administered a cognitive evaluation this reporting period which revealed below normal cognitive function for recall and executive function skills; therefore, SLP initiated both long and short term goals for cognition which will be addressed over the next reporting period.  Pt would continue to benefit from skilled ST while inpatient in order to maximize functional independence and reduce burden of care prior to discharge.  Pt and family education is ongoing.  Continue to anticipate pt will require 24/7 supervision at discharge and potential ST follow up in either the home health or outpatient setting pending progress made while inpatient.     Intensity: Minumum of 1-2 x/day, 30 to 90 minutes Frequency: 5 out of 7 days Duration/Length of Stay: 14-21 days  Treatment/Interventions: English as a second language teacher;Dysphagia/aspiration precaution training;Functional tasks;Patient/family education;Oral motor exercises;Internal/external aids;Environmental controls   Daily Session  Skilled Therapeutic Interventions: Pt was seen for skilled ST targeting dysphagia goals.  Upon arrival, pt was upright in wheelchair, awake, alert, and agreeable to participate in therapy with min encouragement.  Pt presents with overall flatter affect today than in previous therapy sessions.  Pt verbalizes that he is feeling depressed.  Spoke with team earlier who has already informed CSW to initiate neuropsych consult.  SLP completed skilled  observations with trials of thin liquids during a functional meal with pt exhibiting no overt s/s of aspiration with solid or liquid consistencies.  Pt did demonstrate mild left  pocketing due to labial, lingual, and buccal weakness, which he self managed with a lingual sweep and thin liquid wash.  Recommend a diet upgrade to dys 3 solids with thin liquids.  Meds may be crushed or whole in puree per pt preference.  Goals updated on this date to reflect current progress in therapy.  Pt made aware of and in agreement with changes to his plan of care.          FIM:  Comprehension Comprehension Mode: Auditory Comprehension: 5-Follows basic conversation/direction: With no assist Expression Expression Mode: Verbal Expression: 5-Expresses basic needs/ideas: With extra time/assistive device Social Interaction Social Interaction: 4-Interacts appropriately 75 - 89% of the time - Needs redirection for appropriate language or to initiate interaction. Problem Solving Problem Solving: 5-Solves complex 90% of the time/cues < 10% of the time Memory Memory: 4-Recognizes or recalls 75 - 89% of the time/requires cueing 10 - 24% of the time FIM - Eating Eating Activity: 5: Supervision/cues  Pain Pain Assessment Pain Assessment: No/denies pain  Therapy/Group: Individual Therapy  Lark Runk, Selinda Orion 04/20/2014, 4:13 PM

## 2014-04-20 NOTE — Progress Notes (Signed)
Physical Therapy Session Note  Patient Details  Name: [name] MRN: [id number] Date of Birth: [birth year]  Today's Date: 04/20/2014 PT Co-Treatment Time: 0900 (cotx w/ PT [phone] PT Co-Treatment Time Calculation (min): 30 min  Short Term Goals: Week 1:  PT Short Term Goal 1 (Week 1): Pt will perform bed mobility with mod A with HOB flat using rail. PT Short Term Goal 2 (Week 1): Pt will transfer from bed<>w/c with max A of single therapist. PT Short Term Goal 3 (Week 1): Pt will perform w/c mobility x50' with min A and 25% cueing. PT Short Term Goal 4 (Week 1): Pt will ambulate x25' wth Total A of single therapist (and w/c follow for safety, if needed). PT Short Term Goal 5 (Week 1): Pt will perform static standing with single UE support requiring min A and 25% cueing for midline orientation.  Skilled Therapeutic Interventions/Progress Updates:   Pt received lying in bed, agreeable to therapy session.  See other PTs note [name], PT) for details on bed mobility and getting w/c.  Remainder of session focused on gait in hallway with swedish knee cage on RLE and hinged knee brace on LLE for pain control and preventing hyperextension.  Also utilized Geologist, engineering for visual feedback due to decreased sensation and proprioception.  Performed 7' x 1 and another 5' x 1 with +2 assist (three muskateer style) and rehab tech to follow with chair for safety.  Provided max A and verbal cues for upright posture with assist at hips for anterior pelvic tilt, assist at L knee to stabilize during stance and assist for guiding L foot when stepping.  Pt able to advance RLE on his own, however requires assist for weight shift as he continues to push to the L during all mobility.  Pt continues to demonstrate very limited endurance and fear/anxiety with upright mobility.  This causes him to be very depressed as he verbally expressed this during session.  Primary PT has voiced this to CSW and to have neuro  psych follow up with pt.  Pt agreeable.  Pt then propelled towards room x 45' using R hemi technique to strengthen R hamstring at S to min A with mod cues for maintaining R LE in straight alignment.  Pt assisted remainder of distance to room and left in w/c with all needs in reach and half lap try in place.    Therapy Documentation Precautions:  Precautions Precautions: Fall Restrictions Weight Bearing Restrictions: No   Vital Signs: Therapy Vitals BP: (!) 95/55 mmHg Oxygen Therapy SpO2: 96 % O2 Device: Nasal Cannula O2 Flow Rate (L/min): 2 L/min Pain: Pain Assessment Pain Assessment: 0-10 Pain Score: 5  Pain Type: Acute pain Pain Location: Shoulder Pain Orientation: Left Pain Descriptors / Indicators: Aching Pain Onset: With Activity Pain Intervention(s): RN made aware   Locomotion : Ambulation Ambulation/Gait Assistance: 1: +2 Total assist;Other (comment) (+3A (3 musketeers and additional person for w/c follow and O2 management)) Wheelchair Mobility Distance: 55   See FIM for current functional status  Therapy/Group: Co-Treatment  [name], [name] 04/20/2014, 12:27 PM

## 2014-04-20 NOTE — Plan of Care (Signed)
Problem: RH PAIN MANAGEMENT Goal: RH STG PAIN MANAGED AT OR BELOW PT'S PAIN GOAL <3 Outcome: Progressing No c/o pain     

## 2014-04-20 NOTE — Progress Notes (Signed)
Physical Therapy Session Note  Patient Details  Name: [name] MRN: [id number] Date of Birth: 05-18-1950  Today's Date: 04/20/2014 PT Co-Treatment Time: 0830-0900 (Co-tx with EP (PT); entire session from 0830-0930) PT Co-Treatment Time Calculation (min): 30 min  Short Term Goals: Week 1:  PT Short Term Goal 1 (Week 1): Pt will perform bed mobility with mod A with HOB flat using rail. PT Short Term Goal 2 (Week 1): Pt will transfer from bed<>w/c with max A of single therapist. PT Short Term Goal 3 (Week 1): Pt will perform w/c mobility x50' with min A and 25% cueing. PT Short Term Goal 4 (Week 1): Pt will ambulate x25' wth Total A of single therapist (and w/c follow for safety, if needed). PT Short Term Goal 5 (Week 1): Pt will perform static standing with single UE support requiring min A and 25% cueing for midline orientation.  Skilled Therapeutic Interventions/Progress Updates:    Skilled PT co-treatment focusing in bed mobility, standing tolerance, and gait training. Pt received semi reclined in bed on 2 L/min supplemental O2 via [place]. Pt agreeable to therapy. Noted incontinent BM; pt unaware. During brief change and peri care, pt performed rolling to R with min A for full weight shift and to L side with bed rail and supervision. Pt performed supine > sit with HOB flat using rail with mod A, verbal/tactile cueing for technique and to initiate advancement of bilat LE's toward EOB. Pt performed lateral scooting transfer from bed > w/c (to L side) with mod A, multimodal cueing for full anterior weight shift. See co-treating PT note (EP) for additional detail regarding gait training.  Of note, pt expressed feeling "so depressed" several times during this session due to loss of independence, pt perception of slow progress in therapies. RN and CSW Purple Sage notified of these statements and possible need for neuropsych consult. Session ended in pt room, where pt was left seated in w/c with L half  lap tray on and all needs within reach. Pt on 2 L/min supplemental O2 and in no apparent distress.  Therapy Documentation Precautions:  Precautions Precautions: Fall Restrictions Weight Bearing Restrictions: No Vital Signs: Therapy Vitals BP: (!) 95/55 mmHg   Pain: Pain Assessment Pain Assessment: 0-10 Pain Score: 5  Pain Type: Acute pain Pain Location: Shoulder Pain Orientation: Left Pain Descriptors / Indicators: Aching Pain Onset: With Activity Pain Intervention(s): RN made aware Locomotion : Ambulation Ambulation/Gait Assistance: 1: +2 Total assist;Other (comment) (+3A (3 musketeers and additional person for w/c follow and O2 management)) Wheelchair Mobility Distance: 55   See FIM for current functional status  Therapy/Group: Co-Treatment  Hobble, Blair A 04/20/2014, 10:55 AM

## 2014-04-20 NOTE — Progress Notes (Signed)
Patient reported to have poor intake as well as depressed mood. Family also with concerns and  ongoing issues discussed with son.  He was advanced to thin liquids and can now order soups which he likes. He likes boost and this was added to help with intake. He reports insomnia also due to left shoulder pain and trazodone ineffective---he does not want narcotics.  Will add K pad and sports creme as well as tramadol prn. He is willing to try an antidepressant for adjustment reaction to current situation. Will start low dose Remeron to help with mood, appetite and insomnia.

## 2014-04-20 NOTE — Progress Notes (Signed)
63 y.o. right handed male history of HTN, COPD-oxygen dependent, A fib on Eliquis, s/p MAZE June 2014. He was independent prior to admission, lives alone and was using a walker due to back pain.  He was admitted on 04/07/2014 past  with left-sided weakness and mild slurred speech. CCT showed a right IC basal ganglia hemorrhage with a 4 mm right to left midline shift and BP 158/85 at admission. Eliquis was  reversed with Kcentra and BP monitored closely. Recommendations to repeat CCT in 3-4 weeks to help determine ability to resume Eliquis.  Repeat carotid dopplers done revealing 60- 79% L-ICA and 40- 59% R-ICA stenosis   Subjective/Complaints: Still with loose stools. Did not have any complaints for me Review of Systems - Negative except on O2 all the time Objective: Vital Signs: Blood pressure 103/55, pulse 82, temperature 97.6 F (36.4 C), temperature source Oral, resp. rate 20, height 5\' 10"  (1.778 m), weight 96.072 kg (211 lb 12.8 oz), SpO2 96 %. No results found. No results found for this or any previous visit (from the past 72 hour(s)).   HEENT: normal and O2 [place] Cardio: RRR and murmur Resp: CTA B/L and unlabored GI: BS positive and NT, ND Extremity:  No Edema Skin:   Bruise multiple eccymotic areas in BUE and BLE Neuro: Alert/Oriented, Cranial Nerve II-XII normal, Abnormal Sensory reduced on Left, Abnormal Motor 3/5 LUE, 3- Left HF, 4/5 L KE, 3- L ankle DF and Abnormal FMC Ataxic/ dec FMC Musc/Skel:  Other No swelling or pain with AROM Right knee Gen NAD Psych: pleasant and appropriate   Assessment/Plan: 1. Functional deficits secondary to right BG/IC hemorrhage with left hemiparesis and hemisensory loss which require 3+ hours per day of interdisciplinary therapy in a comprehensive inpatient rehab setting. Physiatrist is providing close team supervision and 24 hour management of active medical problems listed below. Physiatrist and rehab team continue to assess barriers to  discharge/monitor patient progress toward functional and medical goals.  FIM: FIM - Bathing Bathing Steps Patient Completed: Chest, Right Arm, Left Arm, Abdomen, Front perineal area, Right upper leg, Left upper leg Bathing: 0: Activity did not occur  FIM - Upper Body Dressing/Undressing Upper body dressing/undressing steps patient completed: Thread/unthread right sleeve of pullover shirt/dresss, Put head through opening of pull over shirt/dress Upper body dressing/undressing: 0: Activity did not occur FIM - Lower Body Dressing/Undressing Lower body dressing/undressing steps patient completed: Thread/unthread right pants leg Lower body dressing/undressing: 0: Activity did not occur  FIM - Musician Devices: Grab bar or rail for support Toileting: 0: Activity did not occur  FIM - Radio producer Devices: Bedside commode, Grab bars Toilet Transfers: 0-Activity did not occur  FIM - Control and instrumentation engineer Devices: Arm rests Bed/Chair Transfer: 3: Supine > Sit: Mod A (lifting assist/Pt. 50-74%/lift 2 legs, 3: Bed > Chair or W/C: Mod A (lift or lower assist)  FIM - Locomotion: Wheelchair Distance: 60 Locomotion: Wheelchair: 2: Travels 50 - 149 ft with moderate assistance (Pt: 50 - 74%) FIM - Locomotion: Ambulation Ambulation/Gait Assistance: 1: +2 Total assist Locomotion: Ambulation: 0: Activity did not occur  Comprehension Comprehension Mode: Auditory Comprehension: 5-Follows basic conversation/direction: With no assist  Expression Expression Mode: Verbal Expression: 5-Expresses complex 90% of the time/cues < 10% of the time  Social Interaction Social Interaction Mode: Asleep Social Interaction: 6-Interacts appropriately with others with medication or extra time (anti-anxiety, antidepressant).  Problem Solving Problem Solving: 5-Solves complex 90% of the time/cues < 10% of  the time  Memory Memory:  4-Recognizes or recalls 75 - 89% of the time/requires cueing 10 - 24% of the time  Medical Problem List and Plan: 1. Functional deficits secondary to right BG/IC hemorrhage with left hemiparesis and hemisensory loss 2.  DVT Prophylaxis/Anticoagulation: Continue SCDs. Add TEDs. Monitor for any signs of DVT. Check follow up CT head early Jan.   3. Pain Management/chronic back pain: Tylenol as needed for pain. Add voltaren gel. Continue Zanaflex 8 mg twice a day 4. Dysphagia. Dysphagia 3 nectar liquids. Monitor for any signs of aspiration. Follow-up speech therapy 5. Neuropsych: This patient is capable of making decisions on her own behalf. 6. Skin/Wound Care: Routine skin checks 7. Fluids/Electrolytes/Nutrition: Strict I and O's. Follow-up chemistries 8. Hypertension. Monitor every 8 hours. Continue Coreg 12.5 mg twice a day, Aldactone 25 mg daily, Demadex as directed, Zaroxolyn 2.5 mg Monday Wednesday Friday. Monitor with increased mobility 9. Atrial fibrillation/status post Maze procedure June 2014: Eliquis on hold secondary to hemorrhage. Continue Lanoxin 0.25 mg every other day. Monitor HR tid.   10. Hyperlipidemia. Lipitor 11. History of gouty arthritis: hold colchicine due to diarrhea . 12. COPD: Respiratory status stable. Continue Symbicort twice daily. 13. H/o  Peripheral edema with weeping: Controlled due to lack of activity. Continue diuretics and elevate when seated. Low salt diet. Monitor with increase in mobility.   14. Constipation:hold all meds d/t diarrhea   LOS (Days) 7 A FACE TO FACE EVALUATION WAS PERFORMED  [name] T 04/20/2014, 8:24 AM

## 2014-04-20 NOTE — Progress Notes (Signed)
72 hour Calorie Count  Please hang calorie count envelope on the patient's door. Document percent consumed for each item on the patient's meal tray ticket and keep in envelope. Also document percent of any supplement or snack pt consumes and keep documentation in envelope for RD to review.   Kallie Locks, MS, RD, LDN Pager # [phone] After hours/ weekend pager # [phone]

## 2014-04-20 NOTE — Progress Notes (Signed)
Continued to hold scheduled senna R/T multi incontinent stools in past 24 hours. Last BM at 0835. Also refused Colchicine-"I don't need it!" C/O not sleeping well, 25mg  of trazodone given Saturday night, not effective. 50 mg of trazodone last night, not effective. Restless night, complaining of left shoulder pain and chronic lower back pain. At 0152 requested and given tylenol 650mg . Very poor appetite, per report ate 0% all day. Drinks evey 1-2 hours with water protocol. Needs frequent reminders to slow down when drinking. Ace wrap in place to LUE to protect arm. [name]

## 2014-04-20 NOTE — Progress Notes (Signed)
Occupational Therapy Session Note  Patient Details  Name: [name] MRN: [id number] Date of Birth: December 17, 1950  Today's Date: 04/20/2014 OT Individual Time: 1000-1100 OT Individual Time Calculation (min): 60 min    Short Term Goals: Week 1:  OT Short Term Goal 1 (Week 1): Patient will perform UB/LB bathing in sit<>stand position with moderate assistance (1 person), using mechanical lift prn OT Short Term Goal 2 (Week 1): Patient will perform UB dressing with min assist while seated unsupported OT Short Term Goal 3 (Week 1): Patient will perform LB dressing with moderate assistance in sit<>stand position (1 person), using mechanical lift prn  OT Short Term Goal 4 (Week 1): Patient will perform toilet transfer using BSC prn with moderate assistance (1 person), using mechanical lift prn OT Short Term Goal 5 (Week 1): Patient will be educated on a strengthening HEP  Skilled Therapeutic Interventions/Progress Updates:    Pt resting in w/c upon arrival.  Pt stated he had on clean clothing and did not want/need to bathe this morning.  Pt transitioned to therapy gym and engaged in sit<>stand tasks, scoot transfers, squats, and BUE weight bearing while leaning forward to lift bottom off mat.  Pt engaged in LUE reaching tasks with emphasis on controlled movements while leaning forward and to left.  Pt required stabilization of left elbow when weight bearing.  Pt required max verbal cues to attend to left when sitting in w/c.  Pt required tot A + 2 for sit<>stand (pt=50%).  Pt exhibits posterior lean during transitional movements and requires max multimodal cues to maintain anterior weight shift.  Focus on activity tolerance, transfers, sit<>stand, standing balance, weight shifts, transitional movements, attention to left, and safety awareness.  Therapy Documentation Precautions:  Precautions Precautions: Fall Restrictions Weight Bearing Restrictions: No Pain: Pain Assessment Pain Assessment:  0-10 Pain Score: 5  Pain Type: Acute pain Pain Location: Shoulder Pain Orientation: Left Pain Descriptors / Indicators: Aching Pain Onset: With Activity Pain Intervention(s): RN made aware  See FIM for current functional status  Therapy/Group: Individual Therapy  Leroy Libman 04/20/2014, 12:12 PM

## 2014-04-21 ENCOUNTER — Inpatient Hospital Stay (HOSPITAL_COMMUNITY): Payer: BLUE CROSS/BLUE SHIELD | Admitting: Occupational Therapy

## 2014-04-21 ENCOUNTER — Inpatient Hospital Stay (HOSPITAL_COMMUNITY): Payer: BLUE CROSS/BLUE SHIELD | Admitting: Physical Therapy

## 2014-04-21 ENCOUNTER — Inpatient Hospital Stay (HOSPITAL_COMMUNITY): Payer: Self-pay | Admitting: Speech Pathology

## 2014-04-21 ENCOUNTER — Inpatient Hospital Stay (HOSPITAL_COMMUNITY): Payer: BLUE CROSS/BLUE SHIELD | Admitting: *Deleted

## 2014-04-21 LAB — BASIC METABOLIC PANEL
Anion gap: 16 — ABNORMAL HIGH (ref 5–15)
Anion gap: 22 — ABNORMAL HIGH (ref 5–15)
Anion gap: 16 — ABNORMAL HIGH (ref 5–15)
Anion gap: 13 (ref 5–15)
BUN: 119 mg/dL — ABNORMAL HIGH (ref 6–23)
BUN: 122 mg/dL — AB (ref 6–23)
BUN: 123 mg/dL — AB (ref 6–23)
BUN: 118 mg/dL — AB (ref 6–23)
CHLORIDE: 79 meq/L — AB (ref 96–112)
CHLORIDE: 80 meq/L — AB (ref 96–112)
CHLORIDE: 83 meq/L — AB (ref 96–112)
CO2: 31 mmol/L (ref 19–32)
CO2: 27 mmol/L (ref 19–32)
CO2: 28 mmol/L (ref 19–32)
CO2: 31 mmol/L (ref 19–32)
Calcium: 8.5 mg/dL (ref 8.4–10.5)
Calcium: 8.6 mg/dL (ref 8.4–10.5)
Calcium: 8.1 mg/dL — ABNORMAL LOW (ref 8.4–10.5)
Calcium: 8.2 mg/dL — ABNORMAL LOW (ref 8.4–10.5)
Chloride: 86 mEq/L — ABNORMAL LOW (ref 96–112)
Creatinine, Ser: 2.46 mg/dL — ABNORMAL HIGH (ref 0.50–1.35)
Creatinine, Ser: 2.36 mg/dL — ABNORMAL HIGH (ref 0.50–1.35)
Creatinine, Ser: 2.28 mg/dL — ABNORMAL HIGH (ref 0.50–1.35)
Creatinine, Ser: 2.12 mg/dL — ABNORMAL HIGH (ref 0.50–1.35)
GFR calc Af Amer: 30 mL/min — ABNORMAL LOW (ref >90)
GFR calc Af Amer: 33 mL/min — ABNORMAL LOW (ref >90)
GFR calc non Af Amer: 26 mL/min — ABNORMAL LOW (ref >90)
GFR calc non Af Amer: 27 mL/min — ABNORMAL LOW (ref >90)
GFR calc non Af Amer: 29 mL/min — ABNORMAL LOW (ref >90)
GFR, EST AFRICAN AMERICAN: 32 mL/min — AB (ref >90)
GFR, EST AFRICAN AMERICAN: 36 mL/min — AB (ref >90)
GFR, EST NON AFRICAN AMERICAN: 31 mL/min — AB (ref >90)
Glucose, Bld: 188 mg/dL — ABNORMAL HIGH (ref 70–99)
Glucose, Bld: 150 mg/dL — ABNORMAL HIGH (ref 70–99)
Glucose, Bld: 190 mg/dL — ABNORMAL HIGH (ref 70–99)
Glucose, Bld: 152 mg/dL — ABNORMAL HIGH (ref 70–99)
POTASSIUM: 2 mmol/L — AB (ref 3.5–5.1)
POTASSIUM: 2.2 mmol/L — AB (ref 3.5–5.1)
POTASSIUM: 2.5 mmol/L — AB (ref 3.5–5.1)
Potassium: 2.8 mmol/L — ABNORMAL LOW (ref 3.5–5.1)
SODIUM: 127 mmol/L — AB (ref 135–145)
Sodium: 126 mmol/L — ABNORMAL LOW (ref 135–145)
Sodium: 129 mmol/L — ABNORMAL LOW (ref 135–145)
Sodium: 130 mmol/L — ABNORMAL LOW (ref 135–145)

## 2014-04-21 MED ORDER — SODIUM CHLORIDE 0.9 % IV SOLN
INTRAVENOUS | Status: DC
  Administered 2014-04-21: 14:00:00 via INTRAVENOUS

## 2014-04-21 MED ORDER — ENSURE COMPLETE PO LIQD
237.0000 mL | Freq: Two times a day (BID) | ORAL | Status: DC
  Administered 2014-04-21 – 2014-04-27 (×7): 237 mL via ORAL

## 2014-04-21 MED ORDER — POTASSIUM CHLORIDE 10 MEQ/100ML IV SOLN
10.0000 meq | INTRAVENOUS | Status: AC
  Administered 2014-04-21 (×2): 10 meq via INTRAVENOUS
  Filled 2014-04-21 (×2): qty 100

## 2014-04-21 MED ORDER — TORSEMIDE 20 MG PO TABS: 20.0000 mg | ORAL_TABLET | Freq: Every day | ORAL | Status: DC

## 2014-04-21 MED ORDER — PRO-STAT SUGAR FREE PO LIQD
30.0000 mL | Freq: Two times a day (BID) | ORAL | Status: DC
  Administered 2014-04-21 – 2014-04-27 (×12): 30 mL via ORAL
  Filled 2014-04-21 (×13): qty 30

## 2014-04-21 MED ORDER — POTASSIUM CHLORIDE 10 MEQ/100ML IV SOLN
10.0000 meq | Freq: Once | INTRAVENOUS | Status: AC
  Administered 2014-04-22: 10 meq via INTRAVENOUS
  Filled 2014-04-21: qty 100

## 2014-04-21 MED ORDER — BOOST / RESOURCE BREEZE PO LIQD
1.0000 | Freq: Three times a day (TID) | ORAL | Status: DC
  Administered 2014-04-21 – 2014-05-01 (×29): 1 via ORAL

## 2014-04-21 MED ORDER — POTASSIUM CHLORIDE CRYS ER 20 MEQ PO TBCR
40.0000 meq | EXTENDED_RELEASE_TABLET | Freq: Once | ORAL | Status: AC
  Administered 2014-04-21: 40 meq via ORAL
  Filled 2014-04-21: qty 2

## 2014-04-21 MED ORDER — POTASSIUM CHLORIDE CRYS ER 20 MEQ PO TBCR
40.0000 meq | EXTENDED_RELEASE_TABLET | Freq: Two times a day (BID) | ORAL | Status: DC
  Administered 2014-04-21 – 2014-04-23 (×4): 40 meq via ORAL
  Filled 2014-04-21 (×10): qty 2

## 2014-04-21 MED ORDER — POTASSIUM CHLORIDE IN NACL 20-0.9 MEQ/L-% IV SOLN
INTRAVENOUS | Status: DC
  Administered 2014-04-21: 21:00:00 via INTRAVENOUS
  Filled 2014-04-21 (×3): qty 1000

## 2014-04-21 MED ORDER — SODIUM CHLORIDE 0.9 % IV SOLN: INTRAVENOUS | Status: DC

## 2014-04-21 NOTE — Progress Notes (Addendum)
BMP Latest Ref Rng 04/21/2014 04/21/2014 04/09/2014  Glucose 70 - 99 mg/dL 150(H) 188(H) 128(H)  BUN 6 - 23 mg/dL 122(H) 119(H) 18  Creatinine 0.50 - 1.35 mg/dL 2.36(H) 2.46(H) 1.35  Sodium 135 - 145 mmol/L 129(L) 126(L) 141  Potassium 3.5 - 5.1 mmol/L 2.2(LL) 2.0(LL) 3.9  Chloride 96 - 112 mEq/L 80(L) 79(L) 100  CO2 19 - 32 mmol/L 27 31 31   Calcium 8.4 - 10.5 mg/dL 8.6 8.5 8.9   Stat repeat labs reveal hypokalemia with acute on chronic renal failure. Patient reported to have poor po intake and two diuretics on board likely contributing to acute renal failure with drop in K+.  Will get stat EKG for baseline and start IVF for hydration as well as runs of potassium  X2 with recheck labs. Will d/c aldactone and hold demadex for 48 hours.      Agree with above. Patient has been over-diuresed in combination with his poor po intake, leading to current situation. Re-hydrate with NS, replete K+, serial labs, EKG. Pt clinically stable. Ask Cardiology for input regarding target volume/weight given CM and multiple cardiac issues.   Meredith Staggers, MD, [facility] 04/21/2014

## 2014-04-21 NOTE — Significant Event (Signed)
CRITICAL VALUE ALERT  Critical value received:  Potassium 2.2 Date of notification:  04/21/14  Time of notification:  12:29 PM  Critical value read back:Yes.    Nurse who received alert:  Janyth Pupa, RN  MD notified (1st page): Algis Liming, PA Time of first page:  12:30 PM   MD notified (2nd page):  Time of second page:  Responding MD:    Time MD responded:

## 2014-04-21 NOTE — Plan of Care (Signed)
Problem: RH SAFETY Goal: RH STG ADHERE TO SAFETY PRECAUTIONS W/ASSISTANCE/DEVICE STG Adhere to Safety Precautions With supervision Assistance/Device.  Outcome: Not Progressing Due to decrease mobility

## 2014-04-21 NOTE — Progress Notes (Signed)
63 y.o. right handed male history of HTN, COPD-oxygen dependent, A fib on Eliquis, s/p MAZE June 2014. He was independent prior to admission, lives alone and was using a walker due to back pain.  He was admitted on 04/07/2014 past  with left-sided weakness and mild slurred speech. CCT showed a right IC basal ganglia hemorrhage with a 4 mm right to left midline shift and BP 158/85 at admission. Eliquis was  reversed with Kcentra and BP monitored closely. Recommendations to repeat CCT in 3-4 weeks to help determine ability to resume Eliquis.  Repeat carotid dopplers done revealing 60- 79% L-ICA and 40- 59% R-ICA stenosis   Subjective/Complaints: Had a good night. Stooling slowed. No sob, cough,  cp Review of Systems - Negative except on O2 all the time Objective: Vital Signs: Blood pressure 124/44, pulse 66, temperature 97.7 F (36.5 C), temperature source Oral, resp. rate 18, height 5\' 10"  (1.778 m), weight 96.072 kg (211 lb 12.8 oz), SpO2 98 %. No results found. No results found for this or any previous visit (from the past 72 hour(s)).   HEENT: normal and O2 [place] Cardio: RRR and murmur Resp: CTA B/L and unlabored GI: BS positive and NT, ND Extremity:  No Edema Skin:   Bruise multiple eccymotic areas in BUE and BLE Neuro: Alert/Oriented, Cranial Nerve II-XII normal, Abnormal Sensory reduced on Left, Abnormal Motor 3/5 LUE, 3- Left HF, 4/5 L KE, 3- L ankle DF and Abnormal FMC Ataxic/ dec FMC Musc/Skel:  Other No swelling or pain with AROM Right knee Gen NAD Psych: pleasant and appropriate   Assessment/Plan: 1. Functional deficits secondary to right BG/IC hemorrhage with left hemiparesis and hemisensory loss which require 3+ hours per day of interdisciplinary therapy in a comprehensive inpatient rehab setting. Physiatrist is providing close team supervision and 24 hour management of active medical problems listed below. Physiatrist and rehab team continue to assess barriers to  discharge/monitor patient progress toward functional and medical goals.  FIM: FIM - Bathing Bathing Steps Patient Completed: Chest, Right Arm, Left Arm, Abdomen, Front perineal area, Right upper leg, Left upper leg Bathing: 0: Activity did not occur  FIM - Upper Body Dressing/Undressing Upper body dressing/undressing steps patient completed: Thread/unthread right sleeve of pullover shirt/dresss, Put head through opening of pull over shirt/dress Upper body dressing/undressing: 0: Activity did not occur FIM - Lower Body Dressing/Undressing Lower body dressing/undressing steps patient completed: Thread/unthread right pants leg Lower body dressing/undressing: 0: Activity did not occur  FIM - Musician Devices: Grab bar or rail for support Toileting: 0: Activity did not occur  FIM - Radio producer Devices: Bedside commode, Grab bars Toilet Transfers: 0-Activity did not occur  FIM - Control and instrumentation engineer Devices: Arm rests Bed/Chair Transfer: 3: Supine > Sit: Mod A (lifting assist/Pt. 50-74%/lift 2 legs, 3: Bed > Chair or W/C: Mod A (lift or lower assist)  FIM - Locomotion: Wheelchair Distance: 55 Locomotion: Wheelchair: 2: Travels 50 - 149 ft with minimal assistance (Pt.>75%) FIM - Locomotion: Ambulation Locomotion: Ambulation Assistive Devices: Orthosis, Other (comment) (R Swedish knee cage; L hinged knee brace) Ambulation/Gait Assistance: 1: +2 Total assist, Other (comment) (+3A (3 musketeers and additional person for w/c follow and O2 management)) Locomotion: Ambulation: 1: Two helpers  Comprehension Comprehension Mode: Auditory Comprehension: 5-Follows basic conversation/direction: With no assist  Expression Expression Mode: Verbal Expression: 5-Expresses basic needs/ideas: With extra time/assistive device  Social Interaction Social Interaction Mode: Asleep Social Interaction: 4-Interacts  appropriately 75 -  89% of the time - Needs redirection for appropriate language or to initiate interaction.  Problem Solving Problem Solving: 5-Solves complex 90% of the time/cues < 10% of the time  Memory Memory: 4-Recognizes or recalls 75 - 89% of the time/requires cueing 10 - 24% of the time  Medical Problem List and Plan: 1. Functional deficits secondary to right BG/IC hemorrhage with left hemiparesis and hemisensory loss 2.  DVT Prophylaxis/Anticoagulation: Continue SCDs. Add TEDs. Monitor for any signs of DVT. Check follow up CT head early Jan.   3. Pain Management/chronic back pain: Tylenol as needed for pain. Add voltaren gel. Continue Zanaflex 8 mg twice a day 4. Dysphagia. Dysphagia 3 nectar liquids. Adv per slp 5. Neuropsych: This patient is capable of making decisions on her own behalf. 6. Skin/Wound Care: Routine skin checks 7. Fluids/Electrolytes/Nutrition: Strict I and O's. Follow-up chemistries 8. Hypertension/CV. Monitor every 8 hours. Continue Coreg 12.5 mg twice a day, Aldactone 25 mg daily, Demadex as directed, Zaroxolyn 2.5 mg Monday Wednesday Friday.   -tolerate lower bp's given CM 9. Atrial fibrillation/status post Maze procedure June 2014: Eliquis on hold secondary to hemorrhage. Continue Lanoxin 0.25 mg every other day. Monitor HR tid.   10. Hyperlipidemia. Lipitor 11. History of gouty arthritis: hold colchicine due to diarrhea . 12. COPD: Respiratory status stable. Continue Symbicort twice daily. 13. H/o  Peripheral edema with weeping:  Continue diuretics and elevate when seated. Low salt diet.     14. Constipation:held all meds d/Sparks diarrhea----improved   LOS (Days) 8 A FACE TO FACE EVALUATION WAS PERFORMED  [name] 04/21/2014, 7:57 AM

## 2014-04-21 NOTE — Consult Note (Signed)
Advanced Heart Failure Team Consult Note  Referring Physician: Dr Naaman Plummer  Primary Physician: none Primary Cardiologist:  Dr Gwenlyn Found   Reason for Consultation: HF worsening renal function  HPI:   [name] is 63 y.o. male with past medical history of severe aortic stenosis s/p tissue AV replacement (09/2012) atrial fibrillation s/p MAZE (09/2012) chronic combined systolic/diastolic HF, RV failure, HTN, PAD s/p L iliac stent in 2010 and aortobifemoral bypass graft, ETOH abuse, severe COPD with cor pulmonale, CAD s/p CABG with AV replacement and back pain from spinal stenosis.  He also has a history of acute renal failure complicated by listeria bacteremia and Afib RVR 09/2013. Marland KitchenHe had TEE DC-CV but went back into afib with controlled rate on amiodarone and eliquis.   Admitted to [facility] 04/07/14 with left sided weakness CT of head showed right basal ganglial ICH with interventricular extension and 46mm of midline shift noted. Anticoagulants reversed. Plan to reconsider anticoagulants in 3-4 weeks if CT head is normal. Once stable, he was transferred to CIR. He was admitted to [facility] 04/13/14. Has been followed closely by PT/OTfor ongoing therapry. Has ongoing L hemiparesis and hemisensory loss. Today he has worsening renal function and the HF team was asked to consult to assist with diuretics. Weight down 4 pounds in the last 24 hours.   He denies dyspnea/CP. Says he feels ok and making slow improvements.   Creatinine  04/09/14  Creatinine1.35  BUN 18 04/21/14 Creatinine 2.0   BUN  119  Potassium today is 2.0    Studies  ECHO 08/2013 EF 50-55% Grade II DD  TEE- 09/25/13  EF 40-45%   PFTs 9/14 FEV1 1.1L (34%) FVC 2.57L (57%) FEF 25-75% (17%) DLCO 41%  SH: Former Biochemist, clinical. Quit smoking 1 year ago. Drink alcohol occasionally . Denies drug use. Lives alone  FH: Father died MI at 62 Brother MI    Review of Systems: [y] = yes, [ ]  = no   General: Weight gain [ ] ; Weight loss [Y ]; Anorexia [ ] ;  Fatigue [ Y]; Fever [ ] ; Chills [ ] ; Weakness [ Y]  Cardiac: Chest pain/pressure [ ] ; Resting SOB [ ] ; Exertional SOB [ ] ; Orthopnea [ ] ; Pedal Edema [ ] ; Palpitations [ ] ; Syncope [ ] ; Presyncope [ ] ; Paroxysmal nocturnal dyspnea[ ]   Pulmonary: Cough [ ] ; Wheezing[ ] ; Hemoptysis[ ] ; Sputum [ ] ; Snoring [ ]   GI: Vomiting[ ] ; Dysphagia[ ] ; Melena[ ] ; Hematochezia [ ] ; Heartburn[ ] ; Abdominal pain [ ] ; Constipation [ ] ; Diarrhea [ ] ; BRBPR [ ]   GU: Hematuria[ ] ; Dysuria [ ] ; Nocturia[ ]   Vascular: Pain in legs with walking [ T]; Pain in feet with lying flat [ ] ; Non-healing sores [ ] ; Stroke [Y ]; TIA [ ] ; Slurred speech [ ] ;  Neuro: Headaches[ ] ; Vertigo[ ] ; Seizures[ ] ; Paresthesias[ Y];Blurred vision [ ] ; Diplopia [ ] ; Vision changes [ ]   Ortho/Skin: Arthritis [ ] ; Joint pain [ ] ; Muscle pain [ ] ; Joint swelling [ ] ; Back Pain [ ] ; Rash [ ]   Psych: Depression[ ] ; Anxiety[ ]   Heme: Bleeding problems [ ] ; Clotting disorders [ ] ; Anemia [ ]   Endocrine: Diabetes [ ] ; Thyroid dysfunction[ ]   Home Medications Prior to Admission medications   Medication Sig Start Date End Date Taking? Authorizing Provider  albuterol (PROVENTIL HFA;VENTOLIN HFA) 108 (90 BASE) MCG/ACT inhaler Inhale 2 puffs into the lungs every 6 (six) hours as needed for wheezing or shortness of breath. 03/13/14  Yes Juanito Doom, MD  apixaban (ELIQUIS) 5 MG TABS tablet Take 1 tablet (5 mg total) by mouth 2 (two) times daily. 11/24/13  Yes Lorretta Harp, MD  atorvastatin (LIPITOR) 80 MG tablet Take 1 tablet (80 mg total) by mouth daily at 6 PM. 03/04/14  Yes Lorretta Harp, MD  budesonide-formoterol [facility]) 80-4.5 MCG/ACT inhaler Inhale 2 puffs into the lungs 2 (two) times daily. 03/13/14  Yes Juanito Doom, MD  carvedilol (COREG) 12.5 MG tablet Take 2 tablets (25 mg total) by mouth 2 (two) times daily with a meal. Patient taking differently: Take 12.5 mg by mouth 2 (two) times daily.  01/14/14  Yes Rande Brunt, NP   diclofenac sodium (VOLTAREN) 1 % GEL Apply 2 g topically 4 (four) times daily as needed (gout).   Yes Historical Provider, MD  digoxin (LANOXIN) 0.25 MG tablet Take 1 tablet (0.25 mg total) by mouth every other day. 10/16/13  Yes Lorretta Harp, MD  folic acid (FOLVITE) 1 MG tablet Take 1 tablet (1 mg total) by mouth daily. 09/30/13  Yes Isaiah Serge, NP  gabapentin (NEURONTIN) 300 MG capsule Take 300 mg by mouth 3 (three) times daily.   Yes Historical Provider, MD  metolazone (ZAROXOLYN) 2.5 MG tablet Take 2.5 mg by mouth every Monday, Wednesday, and Friday.  12/16/13  Yes Isaiah Serge, NP  OXYGEN-HELIUM IN Inhale 1-2 L into the lungs continuous. Is in the habit of not using his oxygen when he leaves the house. Uses 1L continuous normally Uses 2L for exertion   Yes Historical Provider, MD  potassium chloride SA (K-DUR,KLOR-CON) 20 MEQ tablet Take 1 tablet (20 mEq total) by mouth daily. 02/18/14  Yes Jolaine Artist, MD  PRESCRIPTION MEDICATION Apply 1 application topically daily.   Yes Historical Provider, MD  spironolactone (ALDACTONE) 25 MG tablet Take 1 tablet (25 mg total) by mouth daily. 02/18/14  Yes Shaune Pascal Bensimhon, MD  tiZANidine (ZANAFLEX) 4 MG tablet Take 8 mg by mouth 2 (two) times daily.   Yes Historical Provider, MD  torsemide (DEMADEX) 20 MG tablet Take 40 mg (2 tabs) in the morning and 20 mg (1 tab) in the evening Patient taking differently: Take 20-40 mg by mouth 2 (two) times daily. Take 40 mg (2 tabs) in the morning and 20 mg (1 tab) in the evening 01/29/14  Yes Rande Brunt, NP    Past Medical History: Past Medical History  Diagnosis Date  . Hypertension   . Gout   . Aortic stenosis     echo 06/19/08 with nomal LV function, moderate concentric LVH moderate aortic stenosis area 0.99 cm squared, peak gradient of 50 and mean of 31  . Hyperlipidemia   . Atrial fibrillation with RVR, was in atrial fib in 04/2011 09/16/2012    a) s/p MAZE (09/2012)   . Tobacco use  09/16/2012  . Heart murmur   . Chronic combined systolic and diastolic CHF (congestive heart failure) 09/16/2012    a) ECHO (08/2013) EF 50-55%, grade II DD b) TEE ECHO (09/2013): 40-45%, bioprosthesis present, mild MR  . Aortic stenosis, severe 09/23/2012  . CAD (coronary artery disease), single vessel disease 09/23/2012    a) CABG (SVG to PDA and PL) wtih AVR (09/2012)   . PAD (peripheral artery disease), decreased bil. ABIs 09/23/2012    a) ABIs (11/2013): Right mild arterial insufficiency, L normal; aroto-bifem bypass graft: not well visualized, bilateral SFAs = to greater than 50% in dimaeter reduction   . Gout flare.  09/29/12, Lt knee, improved with colchicine. 09/30/2012  . H/O aortic valve replacement   . AS (aortic stenosis) with AVR with pericardial tissue valve  09/30/2013  . Back pain, spinal stenosis 09/30/2013  . COPD (chronic obstructive pulmonary disease)   . Bilateral carotid artery disease     Past Surgical History: Past Surgical History  Procedure Laterality Date  . Iliac artery stent  10/20/08    stent to lt iliac  . Aorto bifem bypass  12/18/08    by Dr. Trula Slade  . Coronary artery bypass graft N/A 10/08/2012    Procedure: CORONARY ARTERY BYPASS GRAFTING (CABG);  Surgeon: Grace Isaac, MD;  Location: [place];  Service: Open Heart Surgery;  Laterality: N/A;  Coronary Artery bypass Graft times two utilizing the left greater saphenous vein harvested endoscopically  . Aortic valve replacement N/A 10/08/2012    Procedure: AORTIC VALVE REPLACEMENT (AVR);  Surgeon: Grace Isaac, MD;  Location: [place];  Service: Open Heart Surgery;  Laterality: N/A;  . Maze N/A 10/08/2012    Procedure: MAZE;  Surgeon: Grace Isaac, MD;  Location: [place];  Service: Open Heart Surgery;  Laterality: N/A;  . Intraoperative transesophageal echocardiogram N/A 10/08/2012    Procedure: INTRAOPERATIVE TRANSESOPHAGEAL ECHOCARDIOGRAM;  Surgeon: Grace Isaac, MD;  Location: [place];  Service: Open Heart Surgery;   Laterality: N/A;  . Endovein harvest of greater saphenous vein Bilateral 10/08/2012    Procedure: ENDOVEIN HARVEST OF GREATER SAPHENOUS VEIN;  Surgeon: Grace Isaac, MD;  Location: [place];  Service: Open Heart Surgery;  Laterality: Bilateral;  . US echocardiography  06/20/2011    mod concentric LVH,LA severely dilated,RA mildly dilated,mod. ca+ of the mitral apparatus,trace MR,mod. ca+ AOV w/stenosis.  Marland Kitchen Nm myocar perf wall motion  08/25/2008    normal  . Cardioversion N/A 02/25/2013    Procedure: CARDIOVERSION;  Surgeon: Sanda Klein, MD;  Location: [place];  Service: Cardiovascular;  Laterality: N/A;  . Tee without cardioversion N/A 09/25/2013    Procedure: TRANSESOPHAGEAL ECHOCARDIOGRAM (TEE);  Surgeon: Thayer Headings, MD;  Location: [place];  Service: Cardiovascular;  Laterality: N/A;  . Cardioversion N/A 09/25/2013    Procedure: CARDIOVERSION;  Surgeon: Thayer Headings, MD;  Location: [facility] ENDOSCOPY;  Service: Cardiovascular;  Laterality: N/A;  . Left and right heart catheterization with coronary angiogram N/A 09/20/2012    Procedure: LEFT AND RIGHT HEART CATHETERIZATION WITH CORONARY ANGIOGRAM;  Surgeon: Lorretta Harp, MD;  Location: [facility] CATH LAB;  Service: Cardiovascular;  Laterality: N/A;    Family History: Family History  Problem Relation Age of Onset  . Heart attack Father     died at 28 with MI  . Heart disease Brother   . Heart attack Brother   . Heart attack Brother     Social History: History   Social History  . Marital Status: Divorced    Spouse Name: N/A    Number of Children: N/A  . Years of Education: N/A   Occupational History  . retired    Social History Main Topics  . Smoking status: Former Smoker -- 1.00 packs/day for 43 years    Types: Cigarettes    Quit date: 09/12/2012  . Smokeless tobacco: Never Used     Comment: More than 43 + pack years as he smoked up to 2 1/2 ppd for many years. Had a period of cessation after femoral stent.  . Alcohol  Use: 25.2 - 50.4 oz/week    42-84 Cans of beer per  week     Comment: 6-12 cans of beer a day  . Drug Use: No  . Sexual Activity: Not on file   Other Topics Concern  . Not on file   Social History Narrative    Allergies:  No Known Allergies  Objective:    Vital Signs:   Temp:  [97.7 F (36.5 C)] 97.7 F (36.5 C) (12/29 0435) Pulse Rate:  [66-80] 80 (12/29 0947) Resp:  [18] 18 (12/29 0435) BP: (124-130)/(44-61) 124/44 mmHg (12/29 0435) SpO2:  [97 %-98 %] 97 % (12/29 0757) Last BM Date: 04/20/14  Weight change: Filed Weights   04/13/14 1700 04/15/14 1401  Weight: 215 lb 13.3 oz (97.9 kg) 211 lb 12.8 oz (96.072 kg)    Intake/Output:   Intake/Output Summary (Last 24 hours) at 04/21/14 1544 Last data filed at 04/21/14 1357  Gross per 24 hour  Intake    600 ml  Output   1025 ml  Net   -425 ml     Physical Exam: General:  Chronically ill  appearing. No resp difficulty. Lying in bed. HEENT: normal Neck: supple. JVP flat . Carotids 2+ bilat; no bruits. No lymphadenopathy or thryomegaly appreciated. Cor: PMI nondisplaced. Regular rate & rhythm. No rubs, gallops or murmurs. Lungs: clear on 2 liters  Abdomen: soft, nontender, distended. No hepatosplenomegaly. No bruits or masses. Good bowel sounds. Extremities: no cyanosis, clubbing, rash, edema. LUE LLE weakness noted.  Neuro: alert & orientedx3, cranial nerves grossly intact. moves all 4 extremities w/o difficulty. Affect pleasant  Labs: Basic Metabolic Panel:  Recent Labs Lab 04/21/14 1033 04/21/14 1230  NA 126* 129*  K 2.0* 2.2*  CL 79* 80*  CO2 31 27  GLUCOSE 188* 150*  BUN 119* 122*  CREATININE 2.46* 2.36*  CALCIUM 8.5 8.6    Liver Function Tests: No results for input(s): AST, ALT, ALKPHOS, BILITOT, PROT, ALBUMIN in the last 168 hours. No results for input(s): LIPASE, AMYLASE in the last 168 hours. No results for input(s): AMMONIA in the last 168 hours.  CBC: No results for input(s): WBC,  NEUTROABS, HGB, HCT, MCV, PLT in the last 168 hours.  Cardiac Enzymes: No results for input(s): CKTOTAL, CKMB, CKMBINDEX, TROPONINI in the last 168 hours.  BNP: BNP (last 3 results)  Recent Labs  09/19/13 1941 09/24/13 0400 01/14/14 1050  PROBNP 3530.0* 1792.0* 872.6*    CBG: No results for input(s): GLUCAP in the last 168 hours.  Coagulation Studies: No results for input(s): LABPROT, INR in the last 72 hours.  Other results: EKG: NSR   Imaging:  No results found.   Medications:     Current Medications: . antiseptic oral rinse  7 mL Mouth Rinse BID  . atorvastatin  80 mg Oral q1800  . budesonide-formoterol  2 puff Inhalation BID  . carvedilol  12.5 mg Oral BID WC  . diclofenac sodium  2 g Topical QID  . digoxin  0.25 mg Oral QODAY  . feeding supplement (ENSURE COMPLETE)  237 mL Oral BID BM  . feeding supplement (PRO-STAT SUGAR FREE 64)  30 mL Oral BID BM  . feeding supplement (RESOURCE BREEZE)  1 Container Oral TID BM  . metolazone  2.5 mg Oral Q M,W,F  . mirtazapine  7.5 mg Oral QHS  . MUSCLE RUB   Topical TID WC  . pantoprazole  40 mg Oral Daily  . potassium chloride  10 mEq Intravenous Q1 Hr x 2  . potassium chloride SA  20 mEq Oral Daily  .  tiZANidine  8 mg Oral BID  . [START ON 04/23/2014] torsemide  20 mg Oral Daily     Infusions: . sodium chloride 100 mL/hr at 04/21/14 1415      Assessment/Plan   [name] is 63 year old with known biventricular HF and chronic A fib followed closely in the HF clinc. Admitted CIR after ICH requiring ongoing trehabilitation due deficits noted on the Left. The HF team was asked to consult by Dr Naaman Plummer for HF and worsening renal function.   1. ICH: off anticoagulants. Plan to repeat CT of head 3-4 weeks to reconsider anticoagulants.  2. Chronic Biventricular HF R>L NICM, EF 40-45%, TAPSE < 1.0 (09/2013).  Weight has dropped significantly over the last week (16 pounds down). Renal function up significantly since  last BMET. BUN on 12/17 was 18 and today 119.   Volume status low. Hold diuretics. Allow weight to drift up. Start NS at 75 cc/hr continuously, reassess sodium in the morning.  Continue carvedilol 12.5 mg twice a day. Continue dig 0.25 mg qod as per home dosing but check digoxin level in am.   Will need daily weights. Strict I/Os. 3. Severe COPD with cor pulmonale: on chronic oxygen.  4 Chronic A fib s/p MAZE: failed DC-CV (09/2013). Today EKG appears to be NSR. Not on anticoagulants as above (ICH). Per Neuro will need repeat CT before considering anticoagulants.  However, given ICH on Eliquis (lower risk for ICH than warfarin), I am concerned that restarting anticoagulation will be high risk.  5. AKI: Suspect prerenal azotemia.  Creatiinine trending up. Weight down 4 pounds. Hold diuretics and hydrate. Repeat BMET in am  6. Hypokalemia: K 2.0 . K is being repleted. Repeat BMET this evening and in am.  7. Hyponatremia: Suspect hypovolemic hyponatremia (use of metolazone - thiazide diuretic - also likely contributes).  Should improve with volume repletion.    Length of Stay: 8  CLEGG,AMY NP-C  04/21/2014, 3:44 PM  Advanced Heart Failure Team Pager [phone] (M-F; 7a - 4p)  Please contact [facility] for night-coverage after hours (4p -7a ) and weekends on [email or web address]  Patient seen with NP, agree with the above note.  We were asked to consult on this patient with chronic biventricular heart failure due to marked rise in BUN/creatinine.  He had not had labs in several days, but prior BUN/creatinine in normal range.  It is possible that this is all prerenal azotemia in the setting of poor po intake after stroke and ongoing diuretic use (including metolazone three times a week).  Hypovolemic hyponatremia.  Hypokalemia (likely due to metolazone use).  He is not volume overloaded on exam.  - Start NS @ 75 cc/hr.  - Stop all diuretics including spironolactone.  - Follow BMET closely.  Hopefully Na  will correct as volume status corrects.   - Would be reticent to restart anticoagulants down the road given ICH on Eliquis.   Loralie Champagne 04/21/2014

## 2014-04-21 NOTE — Plan of Care (Signed)
Problem: RH BOWEL ELIMINATION Goal: RH STG MANAGE BOWEL WITH ASSISTANCE STG Manage Bowel with mod Assistance.  Outcome: Not Progressing Total A as this time due to incontinent episode

## 2014-04-21 NOTE — Progress Notes (Signed)
Nursing Note: Result of potassium called to Danella Sensing ,orders received.wbb

## 2014-04-21 NOTE — Significant Event (Signed)
Critical Value: Result =potassium 2.5.obtained from lab per hospital  protocol from [facility].Paged on-call.At [place] ,obtained order for Bmet at 2300.Pt to receive a dose of 40 meq at 2000.Result given to [facility]

## 2014-04-21 NOTE — Progress Notes (Signed)
Speech Language Pathology Daily Session Note  Patient Details  Name: [name] MRN: [id number] Date of Birth: Dec 13, 1950  Today's Date: 04/21/2014 SLP Individual Time: 6503-5465 SLP Individual Time Calculation (min): 28 min  Short Term Goals: Week 2: SLP Short Term Goal 1 (Week 2): Pt will demonstrate improved mastication for toleration of regular textures with Mod I  SLP Short Term Goal 2 (Week 2): Pt will tolerate dys 3 solids and thin liquids with no overt s/s of aspiration and Mod I use of swallowing precautions.  SLP Short Term Goal 3 (Week 2): Pt will complete oral motor exercises targeting left sided weakness to improve mastication and manipulation of regular solid boluses with supervision.  SLP Short Term Goal 4 (Week 2): Pt will improve recall of daily information via use of compensatory strategies over 80% of observable opportunities with supervision cues.  SLP Short Term Goal 5 (Week 2): Pt will improve functional problem solving skills (i.e. planning, thought organization, and error awareness) during tasks over 80% of observable opportunities with supervision cues.   Skilled Therapeutic Interventions:  Pt was seen for skilled ST targeting self care goals.  Upon arrival, pt was seated upright in wheelchair, awake, alert, and agreeable to participate in ST.  SLP facilitated the session with a basic medication management task targeting recall of currently scheduled medications.  Pt recalled the function of his scheduled medications when named with overall mod assist verbal cues.  SLP provided pt with a list of his medications and left the list in a pt selected location to facilitate delayed recall in between therapy sessions.  Continue per current plan of care.    FIM:  Comprehension Comprehension Mode: Auditory Comprehension: 5-Follows basic conversation/direction: With no assist Expression Expression Mode: Verbal Expression: 5-Expresses basic needs/ideas: With extra  time/assistive device Social Interaction Social Interaction: 4-Interacts appropriately 75 - 89% of the time - Needs redirection for appropriate language or to initiate interaction. Problem Solving Problem Solving: 5-Solves complex 90% of the time/cues < 10% of the time Memory Memory: 4-Recognizes or recalls 75 - 89% of the time/requires cueing 10 - 24% of the time  Pain Pain Assessment Pain Assessment: No/denies pain  Therapy/Group: Individual Therapy  [name], Selinda Orion 04/21/2014, 4:23 PM

## 2014-04-21 NOTE — Progress Notes (Signed)
Physical Therapy Session Note  Patient Details  Name: [name] MRN: [id number] Date of Birth: April 02, 1951  Today's Date: 04/21/2014 PT Individual Time: 1130-1207 PT Individual Time Calculation (min): 37 min   Short Term Goals: Week 1:  PT Short Term Goal 1 (Week 1): Pt will perform bed mobility with mod A with HOB flat using rail. PT Short Term Goal 2 (Week 1): Pt will transfer from bed<>w/c with max A of single therapist. PT Short Term Goal 3 (Week 1): Pt will perform w/c mobility x50' with min A and 25% cueing. PT Short Term Goal 4 (Week 1): Pt will ambulate x25' wth Total A of single therapist (and w/c follow for safety, if needed). PT Short Term Goal 5 (Week 1): Pt will perform static standing with single UE support requiring min A and 25% cueing for midline orientation.  Skilled Therapeutic Interventions/Progress Updates:   Pt received in w/c; decreased pain and fatigue noted today vs. Yesterday.  Pt transported to gym in w/c total A.  Pt performed squat pivot transfers w/c <> elevated mat with max A with max-total verbal and tactile cues for full anterior and lateral lean and trunk elongation to translate COG over BOS and assistance to maintain placement of LLE to facilitate WB and activation of LLE extensors.  On mat performed NMR; see below for details.  Pt returned to room and set up in w/c with all items within reach.  Therapy Documentation Precautions:  Precautions Precautions: Fall Restrictions Weight Bearing Restrictions: No General:   Vital Signs: Therapy Vitals Pulse Rate: 80 Pain: Pain Assessment Pain Assessment: No/denies pain Other Treatments: Treatments Neuromuscular Facilitation: Left;Upper Extremity;Lower Extremity;Forced use;Activity to increase coordination;Activity to increase motor control;Activity to increase timing and sequencing;Activity to increase grading;Activity to increase sustained activation;Activity to increase lateral weight  shifting;Activity to increase anterior-posterior weight shifting attempted to perform sit > stand from mat with RUE reaching up, forwards and to the R to facilitate extension through LLE and R trunk elongation but secondary to feet/gripper socks slipping on floor, unsafe to continue.  Returned to sitting and continued NMR and weight shift, trunk control training with use of reaching up, forwards and to the R with RUE for increased anterior and R weight shifting and maintaining weigh shift.  Transitioned to use and attention to LUE with reaching for clothes pins on target in front of pt to facilitate trunk elongation during anterior weight shift and maintaining grip on pin to place it in bin on L.  Pt tolerated well with min-mod A overall.    See FIM for current functional status  Therapy/Group: Individual Therapy  Raylene Everts [facility] 04/21/2014, 12:23 PM

## 2014-04-21 NOTE — Progress Notes (Signed)
Occupational Therapy Session Note  Patient Details  Name: [name] MRN: [id number] Date of Birth: Nov 13, 1950  Today's Date: 04/21/2014 OT Individual Time: [phone] and 1330-1400 (co-tx with PT full time 1300-1400) OT Individual Time Calculation (min): 60 min and 30 min   Short Term Goals: Week 1:  OT Short Term Goal 1 (Week 1): Patient will perform UB/LB bathing in sit<>stand position with moderate assistance (1 person), using mechanical lift prn OT Short Term Goal 2 (Week 1): Patient will perform UB dressing with min assist while seated unsupported OT Short Term Goal 3 (Week 1): Patient will perform LB dressing with moderate assistance in sit<>stand position (1 person), using mechanical lift prn  OT Short Term Goal 4 (Week 1): Patient will perform toilet transfer using BSC prn with moderate assistance (1 person), using mechanical lift prn OT Short Term Goal 5 (Week 1): Patient will be educated on a strengthening HEP  Skilled Therapeutic Interventions/Progress Updates:    1) Engaged in ADL retraining with focus on bed mobility, sitting balance, sit <> stand, and functional use of LUE during self-care tasks of bathing and dressing.  Pt in bed reporting fatigue this session, but willing to participate in treatment session.  LB bathing and dressing completed at bed level with pt requiring min assist to roll to Rt and supervision when rolling to Lt.  Min verbal cues for LUE placement during mobility.  Pt reports pain in Lt shoulder, suspect Lt subluxation - educated on proper positioning both in bed and when seated up in w/c.  Engaged in sit <> stand with +2 for LB dressing with pt requiring max assist +2 for motor control in standing.  Pt with increased fearfulness in standing reporting BLE buckling despite Rt Swedish knee cage and Lt brace.  Sit <> stand x3 for LB dressing and to promote anterior pelvic tilt to increase participation with sit <> stand.    2) Skilled co-tx with primary PT  with focus on sit <> stand to increase participation in LB dressing and functional tasks.  Donned GivMohr sling to LUE to decrease stress on Lt shoulder and promote increased alignment during sit <> stand and standing.  Multiple sit <> stands from edge of mat with use of mirror to provide visual feedback on alignment and body positioning to promote upright, symmetrical standing.  Pt again with c/o discomfort in Rt knee feeling it buckling.  Removed knee cage and PT placed wedge in Rt shoe, with pt reporting decreased pain and feeling of buckling with subsequent trials of sit <> stand, also noted decreased Lt hip hike and weight shift to Rt.   Therapy Documentation Precautions:  Precautions Precautions: Fall Restrictions Weight Bearing Restrictions: No General:   Vital Signs: Therapy Vitals Pulse Rate: 80 Oxygen Therapy SpO2: 97 % O2 Device: Nasal Cannula O2 Flow Rate (L/min): 2 L/min Pain:  Pt with c/o pain in Lt shoulder, repositioned  See FIM for current functional status  Therapy/Group: Individual Therapy and Co-Treatment  Rayelynn Loyal, [facility] 04/21/2014, 10:53 AM

## 2014-04-21 NOTE — Progress Notes (Signed)
INITIAL NUTRITION ASSESSMENT  DOCUMENTATION CODES Per approved criteria  -Obesity Unspecified   INTERVENTION: Continue 72 hour calorie count.  Discontinue Boost Plus.  Provide Resource Breeze po TID, each supplement provides 250 kcal and 9 grams of protein.  Provide Ensure Complete po BID, each supplement provides 350 kcal and 13 grams of protein.  Provide 30 ml Prostat po BID, each supplement provides 100 kcal and 15 grams of protein.  Encourage adequate PO intake.   NUTRITION DIAGNOSIS: Inadequate oral intake related to decreased appetite as evidenced by varied meal completion of 30-100%.   Goal: Pt to meet >/= 90% of their estimated nutrition needs   Monitor:  PO intake, weight trends, labs, I/O's  Reason for Assessment: MD consult  63 y.o. male  Admitting Dx: Acute left hemiparesis  ASSESSMENT: Pt with history of HTN, COPD-oxygen dependent, A fib on Eliquis, s/p MAZE June 2014. He was admitted on 04/07/2014 past with left-sided weakness and mild slurred speech. CCT showed a right IC basal ganglia hemorrhage with a 4 mm right to left midline shift.  Pt reports having a lack of appetite which has been ongoing over the past 2 weeks. Meal completions have varied from 30-100%. Pt reports PTA he was eating well with no other difficulties. Pt with weight loss. Usual body weight is 230 lbs. Per Epic weight records, pt with a 8.3% weight loss in one month. 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 Pt is also agreeable on Ensure drinks and Prostat. RD to order. Pt has been eating better since advanced to thin liquids. Pt favors soup. Will recommend sending double portions of soup. Pt was encouraged to eat his food at meals and to take his supplements.   72 hour calorie count has been initiated. RD to follow up tomorrow with Day 1 results.  Pt with no observed significant fat or muscle mass loss.   Labs: Low sodium, potassium, chloride, GFR. High BUN and  creatinine.  Height: Ht Readings from Last 1 Encounters:  04/13/14 5\' 10"  (1.778 m)    Weight: Wt Readings from Last 1 Encounters:  04/15/14 211 lb 12.8 oz (96.072 kg)    Ideal Body Weight: 166 lbs  % Ideal Body Weight: 127%  Wt Readings from Last 10 Encounters:  04/15/14 211 lb 12.8 oz (96.072 kg)  04/07/14 227 lb 15.3 oz (103.4 kg)  03/23/14 227 lb (102.967 kg)  03/13/14 230 lb (104.327 kg)  03/04/14 231 lb 3.2 oz (104.872 kg)  02/18/14 228 lb 12.8 oz (103.783 kg)  01/14/14 224 lb 2 oz (101.662 kg)  12/22/13 225 lb 12.8 oz (102.422 kg)  12/16/13 231 lb 3.2 oz (104.872 kg)  12/01/13 231 lb (104.781 kg)    Usual Body Weight: 230 lbs  % Usual Body Weight: 92%  BMI:  Body mass index is 30.39 kg/(m^2). Class I obesity  Estimated Nutritional Needs: Kcal: 2000-2300 Protein: 95-110 grams Fluid: 2 - 2.3L/day  Skin: puncture left hand, wound on left leg, non-pitting RLE edema  Diet Order: DIET DYS 3  EDUCATION NEEDS: -No education needs identified at this time   Intake/Output Summary (Last 24 hours) at 04/21/14 0947 Last data filed at 04/21/14 0807  Gross per 24 hour  Intake   1000 ml  Output   1275 ml  Net   -275 ml    Last BM: 12/28  Labs:  No results for input(s): NA, K, CL, CO2, BUN, CREATININE, CALCIUM, MG, PHOS, GLUCOSE in the last 168 hours.  CBG (last  3)  No results for input(s): GLUCAP in the last 72 hours.  Scheduled Meds: . antiseptic oral rinse  7 mL Mouth Rinse BID  . atorvastatin  80 mg Oral q1800  . budesonide-formoterol  2 puff Inhalation BID  . carvedilol  12.5 mg Oral BID WC  . diclofenac sodium  2 g Topical QID  . digoxin  0.25 mg Oral QODAY  . lactose free nutrition  237 mL Oral TID WC  . metolazone  2.5 mg Oral Q M,W,F  . mirtazapine  7.5 mg Oral QHS  . MUSCLE RUB   Topical TID WC  . pantoprazole  40 mg Oral Daily  . potassium chloride SA  20 mEq Oral Daily  . spironolactone  25 mg Oral Daily  . tiZANidine  8 mg Oral BID  .  torsemide  20 mg Oral q1800  . torsemide  40 mg Oral QAC breakfast    Continuous Infusions:   Past Medical History  Diagnosis Date  . Hypertension   . Gout   . Aortic stenosis     echo 06/19/08 with nomal LV function, moderate concentric LVH moderate aortic stenosis area 0.99 cm squared, peak gradient of 50 and mean of 31  . Hyperlipidemia   . Atrial fibrillation with RVR, was in atrial fib in 04/2011 09/16/2012    a) s/p MAZE (09/2012)   . Tobacco use 09/16/2012  . Heart murmur   . Chronic combined systolic and diastolic CHF (congestive heart failure) 09/16/2012    a) ECHO (08/2013) EF 50-55%, grade II DD b) TEE ECHO (09/2013): 40-45%, bioprosthesis present, mild MR  . Aortic stenosis, severe 09/23/2012  . CAD (coronary artery disease), single vessel disease 09/23/2012    a) CABG (SVG to PDA and PL) wtih AVR (09/2012)   . PAD (peripheral artery disease), decreased bil. ABIs 09/23/2012    a) ABIs (11/2013): Right mild arterial insufficiency, L normal; aroto-bifem bypass graft: not well visualized, bilateral SFAs = to greater than 50% in dimaeter reduction   . Gout flare. 09/29/12, Lt knee, improved with colchicine. 09/30/2012  . H/O aortic valve replacement   . AS (aortic stenosis) with AVR with pericardial tissue valve  09/30/2013  . Back pain, spinal stenosis 09/30/2013  . COPD (chronic obstructive pulmonary disease)   . Bilateral carotid artery disease     Past Surgical History  Procedure Laterality Date  . Iliac artery stent  10/20/08    stent to lt iliac  . Aorto bifem bypass  12/18/08    by Dr. Trula Slade  . Coronary artery bypass graft N/A 10/08/2012    Procedure: CORONARY ARTERY BYPASS GRAFTING (CABG);  Surgeon: Grace Isaac, MD;  Location: [place];  Service: Open Heart Surgery;  Laterality: N/A;  Coronary Artery bypass Graft times two utilizing the left greater saphenous vein harvested endoscopically  . Aortic valve replacement N/A 10/08/2012    Procedure: AORTIC VALVE REPLACEMENT (AVR);   Surgeon: Grace Isaac, MD;  Location: [place];  Service: Open Heart Surgery;  Laterality: N/A;  . Maze N/A 10/08/2012    Procedure: MAZE;  Surgeon: Grace Isaac, MD;  Location: [place];  Service: Open Heart Surgery;  Laterality: N/A;  . Intraoperative transesophageal echocardiogram N/A 10/08/2012    Procedure: INTRAOPERATIVE TRANSESOPHAGEAL ECHOCARDIOGRAM;  Surgeon: Grace Isaac, MD;  Location: [place];  Service: Open Heart Surgery;  Laterality: N/A;  . Endovein harvest of greater saphenous vein Bilateral 10/08/2012    Procedure: ENDOVEIN HARVEST OF GREATER SAPHENOUS VEIN;  Surgeon: Grace Isaac, MD;  Location: [place];  Service: Open Heart Surgery;  Laterality: Bilateral;  . US echocardiography  06/20/2011    mod concentric LVH,LA severely dilated,RA mildly dilated,mod. ca+ of the mitral apparatus,trace MR,mod. ca+ AOV w/stenosis.  Marland Kitchen Nm myocar perf wall motion  08/25/2008    normal  . Cardioversion N/A 02/25/2013    Procedure: CARDIOVERSION;  Surgeon: Sanda Klein, MD;  Location: [place];  Service: Cardiovascular;  Laterality: N/A;  . Tee without cardioversion N/A 09/25/2013    Procedure: TRANSESOPHAGEAL ECHOCARDIOGRAM (TEE);  Surgeon: Thayer Headings, MD;  Location: [place];  Service: Cardiovascular;  Laterality: N/A;  . Cardioversion N/A 09/25/2013    Procedure: CARDIOVERSION;  Surgeon: Thayer Headings, MD;  Location: [facility] ENDOSCOPY;  Service: Cardiovascular;  Laterality: N/A;  . Left and right heart catheterization with coronary angiogram N/A 09/20/2012    Procedure: LEFT AND RIGHT HEART CATHETERIZATION WITH CORONARY ANGIOGRAM;  Surgeon: Lorretta Harp, MD;  Location: [facility] CATH LAB;  Service: Cardiovascular;  Laterality: N/A;    Kallie Locks, MS, RD, LDN Pager # [phone] After hours/ weekend pager # [phone]

## 2014-04-21 NOTE — Progress Notes (Signed)
Physical Therapy Session Note  Patient Details  Name: [name] MRN: [id number] Date of Birth: [birth year]  Today's Date: 04/21/2014 PT Co-Treatment Time: 1300-1330 (Co-tx with [facility] (OT); entire session from 1300-1400) PT Co-Treatment Time Calculation (min): 30 min  Short Term Goals: Week 1:  PT Short Term Goal 1 (Week 1): Pt will perform bed mobility with mod A with HOB flat using rail. PT Short Term Goal 2 (Week 1): Pt will transfer from bed<>w/c with max A of single therapist. PT Short Term Goal 3 (Week 1): Pt will perform w/c mobility x50' with min A and 25% cueing. PT Short Term Goal 4 (Week 1): Pt will ambulate x25' wth Total A of single therapist (and w/c follow for safety, if needed). PT Short Term Goal 5 (Week 1): Pt will perform static standing with single UE support requiring min A and 25% cueing for midline orientation.  Skilled Therapeutic Interventions/Progress Updates:    Skilled co-treatment with primary OT [facility]) focusing on functional transfers, midline orientation in standing. See OT note for further detail regarding this session, fitting of L GivMohr sling. Pt received seated in w/c wearing L hinged knee brace and R Swedish knee cage; agreeable to therapy. Transported pt to gym in w/c with total A for energy conservation. Pt performed multiple sit<>stand transfers from neutral height mat table with +2A with pt performing >50% of transfer but assist required to control/grade movement. Cueing during sit<>stand transfers focused on midline orientation, promoting symmetrical weightbearing/activation. Pt performed static standing with +2A and mirror positioned anterior to pt for visual feedback. Pt continuing to bear majority of weight on RLE during transitional movements and standing. Standing posture therefore characterized by R hip retraction/lateral displacement, R genu recurvatum/varum, and limited L hip/knee extension. Pt also complaining of R hip/knee discomfort in standing  as well as continued pt perception of R knee buckling. Removal of R Swedish knee cage and placement of heel wedges x2 in R shoe effective in controlling R genu recurvatum without causing R knee buckling. Will continue to assess/address. Session ended in pt room, where pt was left seated in w/c on 2L/min supplemental O2; all needs within reach.  Therapy Documentation Precautions:  Precautions Precautions: Fall Restrictions Weight Bearing Restrictions: No Vital Signs: Therapy Vitals Temp: 97.9 F (36.6 C) Temp Source: Oral Pulse Rate: 65 Resp: 17 BP: 125/60 mmHg Patient Position (if appropriate): Sitting Oxygen Therapy SpO2: 98 % O2 Device: Nasal Cannula O2 Flow Rate (L/min): 2 L/min Pain: Pain Assessment Pain Assessment: No/denies pain Pain Score: 0-No pain  See FIM for current functional status  Therapy/Group: Co-Treatment  Hobble, Blair A 04/21/2014, 6:10 PM

## 2014-04-21 NOTE — Progress Notes (Deleted)
Nursing Note: Pt ready  to go to bed. Pt c/o headache earlier but refused to take any meds for pain or agitation.Pt  .assisted to bed as she is ready to go to bed.wbb

## 2014-04-21 NOTE — Significant Event (Signed)
CRITICAL VALUE ALERT  Critical value received:  Potassium level 2.0  Date of notification:  04/21/14  Time of notification:  10:31AM  Critical value read back:Yes.    Nurse who received alert:  Janyth Pupa, RN  MD notified (1st page):  Algis Liming, PA  Time of first page:  10:32AM MD notified (2nd page):  Time of second page:  Responding MD:  Time MD responded:

## 2014-04-22 ENCOUNTER — Inpatient Hospital Stay (HOSPITAL_COMMUNITY): Payer: BLUE CROSS/BLUE SHIELD | Admitting: Occupational Therapy

## 2014-04-22 ENCOUNTER — Ambulatory Visit (HOSPITAL_COMMUNITY): Payer: Self-pay | Admitting: Speech Pathology

## 2014-04-22 ENCOUNTER — Inpatient Hospital Stay (HOSPITAL_COMMUNITY): Payer: BLUE CROSS/BLUE SHIELD | Admitting: *Deleted

## 2014-04-22 DIAGNOSIS — R7989 Other specified abnormal findings of blood chemistry: Secondary | ICD-10-CM

## 2014-04-22 DIAGNOSIS — N179 Acute kidney failure, unspecified: Secondary | ICD-10-CM | POA: Insufficient documentation

## 2014-04-22 LAB — BASIC METABOLIC PANEL
Anion gap: 11 (ref 5–15)
BUN: 112 mg/dL — AB (ref 6–23)
CO2: 29 mmol/L (ref 19–32)
CREATININE: 1.97 mg/dL — AB (ref 0.50–1.35)
Calcium: 8.1 mg/dL — ABNORMAL LOW (ref 8.4–10.5)
Chloride: 90 mEq/L — ABNORMAL LOW (ref 96–112)
GFR, EST AFRICAN AMERICAN: 40 mL/min — AB (ref >90)
GFR, EST NON AFRICAN AMERICAN: 34 mL/min — AB (ref >90)
Glucose, Bld: 162 mg/dL — ABNORMAL HIGH (ref 70–99)
Potassium: 3.1 mmol/L — ABNORMAL LOW (ref 3.5–5.1)
Sodium: 130 mmol/L — ABNORMAL LOW (ref 135–145)

## 2014-04-22 LAB — ALBUMIN: Albumin: 3.2 g/dL — ABNORMAL LOW (ref 3.5–5.2)

## 2014-04-22 LAB — DIGOXIN LEVEL: Digoxin Level: 1.2 ng/mL (ref 0.8–2.0)

## 2014-04-22 MED ORDER — POTASSIUM CHLORIDE 10 MEQ/100ML IV SOLN
10.0000 meq | Freq: Once | INTRAVENOUS | Status: AC
  Administered 2014-04-22: 10 meq via INTRAVENOUS
  Filled 2014-04-22: qty 100

## 2014-04-22 MED ORDER — POTASSIUM CHLORIDE IN NACL 20-0.9 MEQ/L-% IV SOLN
INTRAVENOUS | Status: DC
  Administered 2014-04-22 – 2014-04-23 (×2): via INTRAVENOUS
  Filled 2014-04-22 (×4): qty 1000

## 2014-04-22 MED ORDER — POTASSIUM CHLORIDE CRYS ER 20 MEQ PO TBCR
40.0000 meq | EXTENDED_RELEASE_TABLET | Freq: Once | ORAL | Status: AC
  Administered 2014-04-22: 40 meq via ORAL

## 2014-04-22 NOTE — Progress Notes (Signed)
Day 1 of 3- Calorie Count Note  72 hour calorie count ordered.  Diet: Dysphagia 3 with thin liquids Supplements:  -Resource Breeze po TID, each supplement provides 250 kcal and 9 grams of protein. -Ensure Complete po BID, each supplement provides 350 kcal and 13 grams of protein. - 30 ml Prostat po BID, each supplement provides 100 kcal and 15 grams of protein.  Breakfast: 218 kcal, 18 grams of protein Lunch: 410 kcal, 16 grams of protein Dinner: 100 kcal, 6 grams of protein Supplements: 700 kcal, 37 grams of protein  Total intake: 1428 kcal (71% of minimum estimated needs)  77 grams of protein (81% of minimum estimated needs)  Nutrition Dx:  Inadequate oral intake related to decreased appetite as evidenced by varied meal completion of 30-100%, progressing  Goal:  Pt to meet >/= 90% of their estimated nutrition needs; progressing  Intervention:   Continue Resource Breeze po TID, each supplement provides 250 kcal and 9 grams of protein.  Continue Ensure Complete po BID, each supplement provides 350 kcal and 13 grams of protein.  Continue 30 ml Prostat po BID, each supplement provides 100 kcal and 15 grams of protein.  Encourage adequate PO intake.   Kallie Locks, MS, RD, LDN Pager # [phone] After hours/ weekend pager # [phone]

## 2014-04-22 NOTE — Progress Notes (Signed)
Nursing Note: One run of KCL 10 Meq hung IVP.Potassium this am 3.1wbb

## 2014-04-22 NOTE — Progress Notes (Signed)
Nursing Note: Potassium po given per orders.wbb

## 2014-04-22 NOTE — Progress Notes (Signed)
Speech Language Pathology Daily Session Note  Patient Details  Name: [name] MRN: [id number] Date of Birth: 26-Jan-1951  Today's Date: 04/22/2014 SLP Individual Time: 1120-1205 SLP Individual Time Calculation (min): 45 min  Short Term Goals: Week 2: SLP Short Term Goal 1 (Week 2): Pt will demonstrate improved mastication for toleration of regular textures with Mod I  SLP Short Term Goal 2 (Week 2): Pt will tolerate dys 3 solids and thin liquids with no overt s/s of aspiration and Mod I use of swallowing precautions.  SLP Short Term Goal 3 (Week 2): Pt will complete oral motor exercises targeting left sided weakness to improve mastication and manipulation of regular solid boluses with supervision.  SLP Short Term Goal 4 (Week 2): Pt will improve recall of daily information via use of compensatory strategies over 80% of observable opportunities with supervision cues.  SLP Short Term Goal 5 (Week 2): Pt will improve functional problem solving skills (i.e. planning, thought organization, and error awareness) during tasks over 80% of observable opportunities with supervision cues.   Skilled Therapeutic Interventions:  Pt was seen for skilled ST targeting goals for dysphagia and cognition.  Upon arrival, pt was seated upright in wheelchair, awake, alert, and agreeable to participate in therapy.  Pt presented with overall brighter affect during today's therapy session in comparison to earlier this week.  SLP completed skilled observations with presentations of pt's currently prescribed diet with pt exhibiting no overt s/s of aspiration across solid or liquid consistencies.  Pt with intermittent anterior loss of small amounts of boluses which he did not sense; however, he exhibited grossly intact manipulation of boluses for adequate clearance of residuals post swallow.  Upon completion of meal, SLP provided min-mod cues for delayed recall of location of written aid/medication list from yesterday's  therapy session.  List was completed on this date with mod cues for recall of newly scheduled medications.  At the end of the session, SLP left medication list in a more easily visible location to facilitate delayed recall of its location at next available appointment.  Continue per current plan of care.    FIM:  Comprehension Comprehension Mode: Auditory Comprehension: 5-Follows basic conversation/direction: With no assist Expression Expression Mode: Verbal Expression: 5-Expresses basic needs/ideas: With extra time/assistive device Social Interaction Social Interaction: 4-Interacts appropriately 75 - 89% of the time - Needs redirection for appropriate language or to initiate interaction. Problem Solving Problem Solving: 4-Solves basic 75 - 89% of the time/requires cueing 10 - 24% of the time Memory Memory: 4-Recognizes or recalls 75 - 89% of the time/requires cueing 10 - 24% of the time FIM - Eating Eating Activity: 5: Supervision/cues;5: Set-up assist for open containers  Pain Pain Assessment Pain Assessment: No/denies pain  Therapy/Group: Individual Therapy  Kataleah Bejar, Selinda Orion 04/22/2014, 12:45 PM

## 2014-04-22 NOTE — Progress Notes (Signed)
62 y.o. right handed male history of HTN, COPD-oxygen dependent, A fib on Eliquis, s/p MAZE June 2014. He was independent prior to admission, lives alone and was using a walker due to back pain.  He was admitted on 04/07/2014 past  with left-sided weakness and mild slurred speech. CCT showed a right IC basal ganglia hemorrhage with a 4 mm right to left midline shift and BP 158/85 at admission. Eliquis was  reversed with Kcentra and BP monitored closely. Recommendations to repeat CCT in 3-4 weeks to help determine ability to resume Eliquis.  Repeat carotid dopplers done revealing 60- 79% L-ICA and 40- 59% R-ICA stenosis   Subjective/Complaints: Feels a little better today. Able to sleep. States pain is under reasonable control.  Review of Systems - Negative except on O2 all the time Objective: Vital Signs: Blood pressure 100/50, pulse 60, temperature 97.5 F (36.4 C), temperature source Oral, resp. rate 20, height _0  (1.778 m), weight 88.4 kg (194 lb 14.2 oz), SpO2 99 %. No results found. Results for orders placed or performed during the hospital encounter of 04/13/14 (from the past 72 hour(s))  Basic metabolic panel     Status: Abnormal   Collection Time: 04/21/14 10:33 AM  Result Value Ref Range   Sodium 126 (L) 135 - 145 mmol/L    Comment: Please note change in reference range. DELTA CHECK NOTED    Potassium 2.0 (LL) 3.5 - 5.1 mmol/L    Comment: CRITICAL RESULT CALLED TO, READ BACK BY AND VERIFIED WITH: Leonie Green RN 04/21/14 1157 COSTELLO B RESULTS VERIFIED VIA RECOLLECT Please note change in reference range. REPEATED TO VERIFY    Chloride 79 (L) 96 - 112 mEq/L   CO2 31 19 - 32 mmol/L   Glucose, Bld 188 (H) 70 - 99 mg/dL   BUN 119 (H) 6 - 23 mg/dL    Comment: RESULTS VERIFIED VIA RECOLLECT   Creatinine, Ser 2.46 (H) 0.50 - 1.35 mg/dL   Calcium 8.5 8.4 - 10.5 mg/dL   GFR calc non Af Amer 26 (L) >90 mL/min   GFR calc Af Amer 30 (L) >90 mL/min    Comment: (NOTE) The eGFR has  been calculated using the CKD EPI equation. This calculation has not been validated in all clinical situations. eGFR's persistently <90 mL/min signify possible Chronic Kidney Disease. CORRECTED ON 12/29 AT 1452: PREVIOUSLY REPORTED AS 30    Anion gap 16 (H) 5 - 15  Basic metabolic panel     Status: Abnormal   Collection Time: 04/21/14 12:30 PM  Result Value Ref Range   Sodium 129 (L) 135 - 145 mmol/L    Comment: Please note change in reference range.   Potassium 2.2 (LL) 3.5 - 5.1 mmol/L    Comment: Please note change in reference range. REPEATED TO VERIFY CRITICAL RESULT CALLED TO, READ BACK BY AND VERIFIED WITH: V.WASHINGTON,RN 04/21/14 1400 BY BSLADE    Chloride 80 (L) 96 - 112 mEq/L   CO2 27 19 - 32 mmol/L   Glucose, Bld 150 (H) 70 - 99 mg/dL   BUN 122 (H) 6 - 23 mg/dL   Creatinine, Ser 2.36 (H) 0.50 - 1.35 mg/dL    Comment: RESULTS VERIFIED VIA RECOLLECT   Calcium 8.6 8.4 - 10.5 mg/dL   GFR calc non Af Amer 27 (L) >90 mL/min   GFR calc Af Amer 32 (L) >90 mL/min    Comment: (NOTE) The eGFR has been calculated using the CKD EPI equation. This calculation has not  been validated in all clinical situations. eGFR's persistently <90 mL/min signify possible Chronic Kidney Disease.    Anion gap 22 (H) 5 - 15  Basic metabolic panel     Status: Abnormal   Collection Time: 04/21/14  7:00 PM  Result Value Ref Range   Sodium 127 (L) 135 - 145 mmol/L    Comment: Please note change in reference range.   Potassium 2.5 (LL) 3.5 - 5.1 mmol/L    Comment: Please note change in reference range. REPEATED TO VERIFY CRITICAL RESULT CALLED TO, READ BACK BY AND VERIFIED WITH: B BAKER,RN 1947 04/21/14 WBOND    Chloride 83 (L) 96 - 112 mEq/L   CO2 28 19 - 32 mmol/L   Glucose, Bld 190 (H) 70 - 99 mg/dL   BUN 123 (H) 6 - 23 mg/dL   Creatinine, Ser 2.28 (H) 0.50 - 1.35 mg/dL   Calcium 8.1 (L) 8.4 - 10.5 mg/dL   GFR calc non Af Amer 29 (L) >90 mL/min   GFR calc Af Amer 33 (L) >90 mL/min     Comment: (NOTE) The eGFR has been calculated using the CKD EPI equation. This calculation has not been validated in all clinical situations. eGFR's persistently <90 mL/min signify possible Chronic Kidney Disease.    Anion gap 16 (H) 5 - 15  Basic metabolic panel     Status: Abnormal   Collection Time: 04/21/14 10:40 PM  Result Value Ref Range   Sodium 130 (L) 135 - 145 mmol/L    Comment: Please note change in reference range.   Potassium 2.8 (L) 3.5 - 5.1 mmol/L    Comment: Please note change in reference range.   Chloride 86 (L) 96 - 112 mEq/L   CO2 31 19 - 32 mmol/L   Glucose, Bld 152 (H) 70 - 99 mg/dL   BUN 118 (H) 6 - 23 mg/dL   Creatinine, Ser 2.12 (H) 0.50 - 1.35 mg/dL   Calcium 8.2 (L) 8.4 - 10.5 mg/dL   GFR calc non Af Amer 31 (L) >90 mL/min   GFR calc Af Amer 36 (L) >90 mL/min    Comment: (NOTE) The eGFR has been calculated using the CKD EPI equation. This calculation has not been validated in all clinical situations. eGFR's persistently <90 mL/min signify possible Chronic Kidney Disease.    Anion gap 13 5 - 15  Digoxin level     Status: None   Collection Time: 04/22/14  4:00 AM  Result Value Ref Range   Digoxin Level 1.2 0.8 - 2.0 ng/mL  Basic metabolic panel     Status: Abnormal   Collection Time: 04/22/14  4:00 AM  Result Value Ref Range   Sodium 130 (L) 135 - 145 mmol/L    Comment: Please note change in reference range.   Potassium 3.1 (L) 3.5 - 5.1 mmol/L    Comment: Please note change in reference range.   Chloride 90 (L) 96 - 112 mEq/L   CO2 29 19 - 32 mmol/L   Glucose, Bld 162 (H) 70 - 99 mg/dL   BUN 112 (H) 6 - 23 mg/dL   Creatinine, Ser 1.97 (H) 0.50 - 1.35 mg/dL   Calcium 8.1 (L) 8.4 - 10.5 mg/dL   GFR calc non Af Amer 34 (L) >90 mL/min   GFR calc Af Amer 40 (L) >90 mL/min    Comment: (NOTE) The eGFR has been calculated using the CKD EPI equation. This calculation has not been validated in all clinical situations. eGFR's persistently <  90 mL/min  signify possible Chronic Kidney Disease.    Anion gap 11 5 - 15     HEENT: normal and O2 [place] Cardio: RRR and murmur Resp: CTA B/L and unlabored, no rales or wheezes GI: BS positive and NT, ND Extremity:  No Edema Skin:   Bruise multiple eccymotic areas in BUE and BLE Neuro: Alert/Oriented, Cranial Nerve II-XII normal, Abnormal Sensory reduced on Left, Abnormal Motor 3/5 LUE, 3- Left HF, 4/5 L KE, 3- L ankle DF and Abnormal FMC Ataxic/ dec FMC Musc/Skel:  Other No swelling or pain with AROM Right knee Gen NAD Psych: pleasant and appropriate   Assessment/Plan: 1. Functional deficits secondary to right BG/IC hemorrhage with left hemiparesis and hemisensory loss which require 3+ hours per day of interdisciplinary therapy in a comprehensive inpatient rehab setting. Physiatrist is providing close team supervision and 24 hour management of active medical problems listed below. Physiatrist and rehab team continue to assess barriers to discharge/monitor patient progress toward functional and medical goals.  FIM: FIM - Bathing Bathing Steps Patient Completed: Chest, Left Arm, Abdomen, Front perineal area, Right upper leg, Left upper leg Bathing: 3: Mod-Patient completes 5-7 [id number] parts or 50-74%  FIM - Upper Body Dressing/Undressing Upper body dressing/undressing steps patient completed: Thread/unthread right sleeve of pullover shirt/dresss, Thread/unthread left sleeve of pullover shirt/dress, Put head through opening of pull over shirt/dress Upper body dressing/undressing: 4: Min-Patient completed 75 plus % of tasks FIM - Lower Body Dressing/Undressing Lower body dressing/undressing steps patient completed: Thread/unthread right pants leg Lower body dressing/undressing: 1: Two helpers  FIM - TMusicianDevices: Grab bar or rail for support Toileting: 0: Activity did not occur  FIM - TRadio producerDevices: Bedside commode, Grab  bars Toilet Transfers: 0-Activity did not occur  FIM - BControl and instrumentation engineerDevices: Arm rests Bed/Chair Transfer: 1: Bed > Chair or W/C: Total A (helper does all/Pt. < 25%), 1: Two helpers  FIM - Locomotion: Wheelchair Distance: 55 Locomotion: Wheelchair: 1: Total Assistance/staff pushes wheelchair (Pt<25%) FIM - Locomotion: Ambulation Locomotion: Ambulation Assistive Devices: Orthosis, Other (comment) (R Swedish knee cage; L hinged knee brace) Ambulation/Gait Assistance: 1: +2 Total assist, Other (comment) (+3A (3 musketeers and additional person for w/c follow and O2 management)) Locomotion: Ambulation: 0: Activity did not occur  Comprehension Comprehension Mode: Auditory Comprehension: 5-Follows basic conversation/direction: With no assist  Expression Expression Mode: Verbal Expression: 5-Expresses basic needs/ideas: With extra time/assistive device  Social Interaction Social Interaction Mode: Asleep Social Interaction: 4-Interacts appropriately 75 - 89% of the time - Needs redirection for appropriate language or to initiate interaction.  Problem Solving Problem Solving: 5-Solves basic 90% of the time/requires cueing < 10% of the time  Memory Memory: 4-Recognizes or recalls 75 - 89% of the time/requires cueing 10 - 24% of the time  Medical Problem List and Plan: 1. Functional deficits secondary to right BG/IC hemorrhage with left hemiparesis and hemisensory loss 2.  DVT Prophylaxis/Anticoagulation: Continue SCDs. Add TEDs. Monitor for any signs of DVT. Check follow up CT head early Jan.   3. Pain Management/chronic back pain: Tylenol as needed for pain. Add voltaren gel. Continue Zanaflex 8 mg twice a day 4. Dysphagia. Dysphagia 3 nectar liquids. Adv per slp 5. Neuropsych: This patient is capable of making decisions on her own behalf. 6. Skin/Wound Care: Routine skin checks 7. Fluids/Electrolytes/Nutrition: ivf at 75/hr. Encourage po 8.  Hypertension/CV. Monitor every 8 hours. Continue Coreg 12.5 mg twice a day, Aldactone 25  mg daily, Demadex as directed, Zaroxolyn 2.5 mg Monday Wednesday Friday.   -diuretics held due to pre-renal azotemia  -potassium recovering with supplementation   -continue IVF with close observation to CV-P status  -daily bmet 9. Atrial fibrillation/status post Maze procedure June 2014: Eliquis on hold secondary to hemorrhage. Continue Lanoxin 0.25 mg every other day. Monitor HR tid.   10. Hyperlipidemia. Lipitor 11. History of gouty arthritis: hold colchicine due to diarrhea . 12. COPD: Respiratory status stable. Continue Symbicort twice daily. 13. H/o  Peripheral edema with weeping:  Continue diuretics and elevate when seated. Low salt diet.     14. Constipation: ----improved   LOS (Days) 9 A FACE TO FACE EVALUATION WAS PERFORMED  SWARTZ,ZACHARY T 04/22/2014, 7:46 AM

## 2014-04-22 NOTE — Plan of Care (Signed)
Problem: RH BOWEL ELIMINATION Goal: RH STG MANAGE BOWEL WITH ASSISTANCE STG Manage Bowel with mod Assistance.  Outcome: Not Progressing Incont. Depends on staff to change pt

## 2014-04-22 NOTE — Patient Care Conference (Signed)
Inpatient RehabilitationTeam Conference and Plan of Care Update Date: 04/22/2014   Time: 9:20 AM    Patient Name: [name]      Medical Record Number: [id number]  Date of Birth: 09-Mar-1951 Sex: Male         Room/Bed: 4M07C/4M07C-01 Payor Info: Payor: [place] / Plan: [employer] [place] PPO / Product Type: *No Product type* /    Admitting Diagnosis: R BG ICH  Admit Date/Time:  04/13/2014  4:21 PM Admission Comments: No comment available   Primary Diagnosis:  Acute left hemiparesis Principal Problem: Acute left hemiparesis  Patient Active Problem List   Diagnosis Date Noted  . AKI (acute kidney injury)   . Altered sensation due to stroke 04/16/2014  . Acute left hemiparesis 04/13/2014  . ICH (intracerebral hemorrhage) 04/07/2014  . Bilateral carotid artery disease 03/04/2014  . Acute cor pulmonale 01/14/2014  . RVF (right ventricular failure) 01/14/2014  . Hypotension 10/23/2013  . Chronic combined systolic and diastolic heart failure 33/43/5686  . Back pain, spinal stenosis 09/30/2013  . Alcohol abuse 09/28/2013  . 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 Chronic anticoagulation 11/13/2012  . Long term (current) use of anticoagulants, Eliquis 10/23/2012  . Chronic a-fib- failed Amio-DCCV  10/23/2012  . Gout flare. 09/29/12, Lt knee, improved with colchicine. 09/30/2012  . Aortic stenosis, severe- Tissue AVR 5/14 09/23/2012  . CAD, CABG X 2 with SVG-PD/PL 5/14 09/23/2012  . Cellulitis, lower extremity- treated-May 2015 09/23/2012  . PAD (peripheral artery disease), decreased bil. ABIs 09/23/2012  . COPD on home oxygen 09/23/2012  . Acute respiratory failure, with BiPAP needed on admit 09/16/2012  . PAF - Maze at the time of his CABG/AVR 5/14. Recurrent a fib On Amiodarone, DCCV this admit with recurrent a fib, now persistent a fib 09/16/2012  . HTN (hypertension) 09/16/2012  . Hyperlipidemia 09/16/2012  . Noncompliance, no meds for two weeks prior to admission  09/16/2012  . Tobacco use 09/16/2012  . Acute combined systolic and diastolic CHF - EF  16-83% June 2015  09/16/2012  . Bilateral lower extremity edema, secondary to chronic CHF 09/16/2012    Expected Discharge Date: Expected Discharge Date: 05/05/14 (vs. SNF)  Team Members Present: Physician leading conference: Dr. Alger Simons Social Worker Present: Lennart Pall, LCSW Nurse Present: Elliot Cousin, RN PT Present: Raylene Everts, PT;Blair Hobble, PT OT Present: Simonne Come, Dorothyann Gibbs, OT SLP Present: Windell Moulding, SLP PPS Coordinator present : Daiva Nakayama, RN, CRRN     Current Status/Progress Goal Weekly Team Focus  Medical   pre-renal azotemia, fluid and electrolyte resuscitation, depression, orthotist following up on brace  improve activity tolerance, pain control, nutrition  ivf, electrolyte replacement,    Bowel/Bladder   Incontinent of bowel. LBM 04/20/14. Pt continent of bladder  Managed bowel program  Initiate time toileting   Swallow/Nutrition/ Hydration   Dys 3, thin liquid diet; supervision for use of swallowing precautions   Mod I with least restrictive diet   trials of advanced consistencies    ADL's   mod assist bathing at bed levle, min assist UB dressing, total +2 LB dressing and transfers  supervision  LUE NMR, motor control, sit <> stand, transfers   Mobility   Mod A bed mobility; +2A transfers. Continues to be limited by R knee pain despite bracing to maintain stability.  Supervision at w/c level  Mitigate R knee pain, bed mobility, functional transfers, L NMR, standing tolerance, midline oriientation, activity tolerance   Communication  Safety/Cognition/ Behavioral Observations  min assist   Mod I   memory, problem solving, self-monitoring and correcting errors   Pain   C/o of discomfort to L shoulder, R knee. Muscle rub to L shoulder. Voltaren gel to R knee. Occassional Tylenol 650mg  q 4hrs or Tramadol 50mg  q 6hrs  <4  Offer pain medication  1hr prior to initial therapy session. Assess for effectiveness   Skin   Multiple ecchymosis and skin tears to LUE/LLE with tegaderm and allevyn dressing to area  No additional skin breakdown  Assess for appropriate healing q shift. Change dressing prn      *See Care Plan and progress notes for long and short-term goals.  Barriers to Discharge: decreased awareness, poor po intake, depression    Possible Resolutions to Barriers:  rx above, improve nutrition, family and patient education    Discharge Planning/Teaching Needs:  Family working on a discharge plan and discussing what each can provide and if 24 hr care can be provided      Team Discussion:  Pain, dehydration, depression all limiting factors.  To be eval'd today for possible bracing of LE.  +2 for any standing.  Feel that a worsening depression is primary factor in decline in function.  Will have to have 24 /7 physical assist which may mean the d/c plan will change to SNF.  Revisions to Treatment Plan:  Neuropsychology consult.  Downgrading of most goals to minimal assist.   Continued Need for Acute Rehabilitation Level of Care: The patient requires daily medical management by a physician with specialized training in physical medicine and rehabilitation for the following conditions: Daily direction of a multidisciplinary physical rehabilitation program to ensure safe treatment while eliciting the highest outcome that is of practical value to the patient.: Yes Daily medical management of patient stability for increased activity during participation in an intensive rehabilitation regime.: Yes Daily analysis of laboratory values and/or radiology reports with any subsequent need for medication adjustment of medical intervention for : Neurological problems;Cardiac problems;Other  Otto Felkins 04/22/2014, 2:03 PM

## 2014-04-22 NOTE — Progress Notes (Signed)
Occupational Therapy Weekly Progress Note  Patient Details  Name: [name] MRN: [id number] Date of Birth: 05-31-1950  Beginning of progress report period: April 14, 2014 End of progress report period: April 22, 2014  Today's Date: 04/22/2014 OT Individual Time: 8588-5027 and 1440-1510 OT Individual Time Calculation (min): 45 min and 30 min   Patient has met 0 of 5 short term goals.  Pt making slow progress towards goals and goals modified to reflect progress.  Bilateral knee pain is limiting pt's progress with transfers, sit <> stand, and standing tolerance.  Pt has also developed Lt shoulder pain which impacts his participation in self-care tasks and LUE NMR.    Patient continues to demonstrate the following deficits: LUE/LLE weakness, impaired sensation and proprioception, activity tolerance/endurance, decreased standing balance  and therefore will continue to benefit from skilled OT intervention to enhance overall performance with BADL and Reduce care partner burden.  Patient not progressing toward long term goals.  See goal revision..  Plan of care revisions: downgraded to min assist overall.  OT Short Term Goals Week 1:  OT Short Term Goal 1 (Week 1): Patient will perform UB/LB bathing in sit<>stand position with moderate assistance (1 person), using mechanical lift prn OT Short Term Goal 1 - Progress (Week 1): Partly met OT Short Term Goal 2 (Week 1): Patient will perform UB dressing with min assist while seated unsupported OT Short Term Goal 2 - Progress (Week 1): Progressing toward goal OT Short Term Goal 3 (Week 1): Patient will perform LB dressing with moderate assistance in sit<>stand position (1 person), using mechanical lift prn  OT Short Term Goal 3 - Progress (Week 1): Not met OT Short Term Goal 4 (Week 1): Patient will perform toilet transfer using BSC prn with moderate assistance (1 person), using mechanical lift prn OT Short Term Goal 4 - Progress (Week 1):  Not met OT Short Term Goal 5 (Week 1): Patient will be educated on a strengthening HEP OT Short Term Goal 5 - Progress (Week 1): Not met Week 2:  OT Short Term Goal 1 (Week 2): Pt will complete bathing at sit <> stand level with max assist of 1 person OT Short Term Goal 2 (Week 2): Pt will complete UB dressing with min assist while seated unsupported OT Short Term Goal 3 (Week 2): Pt will complete LB dressing at sit <> stand level with max assist of 1 person OT Short Term Goal 4 (Week 2): Pt will complete toilet transfer to [facility] with mod assist of 1 person  Skilled Therapeutic Interventions/Progress Updates:  Balance/vestibular training;Community reintegration;Discharge planning;DME/adaptive equipment instruction;Functional electrical stimulation;Functional mobility training;Neuromuscular re-education;Pain management;Patient/family education;Psychosocial support;Self Care/advanced ADL retraining;Skin care/wound managment;Splinting/orthotics;Therapeutic Activities;Therapeutic Exercise;UE/LE Strength taining/ROM;UE/LE Coordination activities;Wheelchair propulsion/positioning   1) Engaged in ADL retraining with focus on bed mobility, sitting balance, functional use of LUE during self-care tasks of bathing and dressing, and transfers.  Pt with no clean clothes this session, therefore engaged in bathing seated at EOB and then donned hospital gown.  Increased focus on functional use of LUE during bathing tasks with pt with increased participation in bathing with LUE with no c/o pain during bathing tasks.  Pt dropped washcloth x3 during bathing tasks with decreased awareness.  Squat pivot transfer to Lt with focus on anterior weight shift to increase participation in transfers.  Pt impulsive with transfer, requiring max assist +2 for motor control during transfer.  2)  Engaged in therapeutic activity with focus on LUE NMR with graded movements in  sitting.  Utilized peg board with focus on grasp and release of  1" pegs and placing in peg board, educated pt on visually attending to hand during movement to improve coordination.  Pt utilized gross grasp, dropping approx 50% of pegs. Pt reports frustration with LUE and pain post session.  Returned to bed via squat pivot transfer to Rt with +2 for safety.  Noted pt to be incontinent of urine despite wearing condom catheter, nurse tech present to assist with clean up.  Therapy Documentation Precautions:  Precautions Precautions: Fall Restrictions Weight Bearing Restrictions: No General:   Vital Signs: Therapy Vitals Temp: 97.5 F (36.4 C) Temp Source: Oral Pulse Rate: 60 Resp: 20 BP: (!) 100/50 mmHg Oxygen Therapy SpO2: 99 % O2 Device: Nasal Cannula O2 Flow Rate (L/min): 2 L/min Pain:  Pt with c/o pain in Lt shoulder, repositioned and premedicated  See FIM for current functional status  Therapy/Group: Individual Therapy  Simonne Come 04/22/2014, 7:24 AM

## 2014-04-22 NOTE — Progress Notes (Signed)
Physical Therapy Weekly Progress Note  Patient Details  Name: [name] MRN: [id number] Date of Birth: Jun 05, 1950  Beginning of progress report period: April 14, 2014 End of progress report period: April 22, 2014  Today's Date: 04/22/2014 PT Individual Time: 1300-1400 (Co-tx with rec therapist) PT Individual Time Calculation (min): 60 min   Patient has met 1 of 5 short term goals and has partly met 2 of 5 STG's. Pt's progress has been limited by pain in R knee and progressively less pt motivation during therapy sessions. R knee pain appeared to be related to R genu recurvatum/varum in standing; however, once recurvatum was controlled using Swedish knee cage, standing was limited by pt perception of R knee buckling.   Patient continues to demonstrate the following deficits: muscle weakness, decreased oxygen support, impaired timing and sequencing, unbalanced muscle activation and decreased motor planning, decreased midline orientation and decreased attention to left and decreased sitting balance, decreased standing balance, decreased postural control, hemiplegia, decreased balance strategies and R knee pain/instability and therefore will continue to benefit from skilled PT intervention to enhance overall performance with activity tolerance, balance, postural control, ability to compensate for deficits, functional use of  left upper extremity and left lower extremity, attention and coordination.  Patient not progressing toward long term goals.  See goal revision.  Plan of care revisions: Long term goals downgraded to Min A overall at w/c level. Downgraded ambulation goals to Total A. Pending change in R knee pain with weightbearing, will consider discharging ambulation goal.. Discharged LTG for community w/c mobility due to decreased cardiovascular endurance, limited activity tolerance.  PT Short Term Goals Week 1:  PT Short Term Goal 1 (Week 1): Pt will perform bed mobility with mod A  with HOB flat using rail. PT Short Term Goal 1 - Progress (Week 1): Partly met (Pt inconsistently able to perform supine > sit with Mod to Max A.) PT Short Term Goal 2 (Week 1): Pt will transfer from bed<>w/c with max A of single therapist. PT Short Term Goal 2 - Progress (Week 1): Partly met (Pt inconsistently able to transfer with mod A of single therapist.) PT Short Term Goal 3 (Week 1): Pt will perform w/c mobility x50' with min A and 25% cueing. PT Short Term Goal 3 - Progress (Week 1): Met PT Short Term Goal 4 (Week 1): Pt will ambulate x25' wth Total A of single therapist (and w/c follow for safety, if needed). PT Short Term Goal 4 - Progress (Week 1): Not met PT Short Term Goal 5 (Week 1): Pt will perform static standing with single UE support requiring min A and 25% cueing for midline orientation. PT Short Term Goal 5 - Progress (Week 1): Not met Week 2:  PT Short Term Goal 1 (Week 2): Pt will perform supine > sit with mod A with HOB flat using bed rail. PT Short Term Goal 2 (Week 2): Pt will perform sit > supine with HOB flat and mod A. PT Short Term Goal 3 (Week 2): Pt will consistently transfer from bed<>w/c with mod A of single therapist. PT Short Term Goal 4 (Week 2): Pt will perform w/c mobility x75' with supervision and 25% cueing for technique.  Skilled Therapeutic Interventions/Progress Updates:    Co-treatment with rec therapist focusing on increasing pt independence with functional transfers. Pt received seated in w/c on 2 L/min supplemental O2, [place] and wearing L knee orthosis; agreeable to therapy. Pt reporting no pain at this time; however, pt reporting  having not gotten enough sleep last night. Pt transported to ortho gym in w/c with total A for energy conservation. Pt performed lateral scooting transfer from w/c<>mat table with max A to +2A for safety; cueing focused on grading of movement and slow, pt initiation of forward weight shift and LE activation to lift buttocks;  manual facilitation provided for protection of LUE during transfers secondary to decreased attention to L side of body. See below for detailed description of NMR. Upon returning to pt room, attempted sit>stand in front of sink with +2A; however, pt not open to reattempting after unsuccessful initial trial. Departed with pt seated in w/c with L half lap tray on and all needs within reach.  Therapy Documentation Precautions:  Precautions Precautions: Fall Restrictions Weight Bearing Restrictions: No General:   Vital Signs: Therapy Vitals Temp: 98.4 F (36.9 C) Temp Source: Oral Pulse Rate: 76 Resp: 18 BP: (!) 106/45 mmHg Oxygen Therapy SpO2: 97 % O2 Device: Nasal Cannula O2 Flow Rate (L/min): 2 L/min Pain: Pain Assessment Pain Assessment: No/denies pain NMR: Neuromuscular Facilitation: Left;Upper Extremity;Lower Extremity;Activity to increase coordination;Activity to increase motor control;Activity to increase grading;Activity to increase sustained activation;Activity to increase anterior-posterior weight shifting;Activity to increase lateral weight shifting Seated EOM, pt performed bilat scooting with tactile cueing at L knee for increased proprioceptive input; tactile cueing at R ribcage for upright posture, erect trunk during anterior weight shift; rec therapist manually repositioned LLE throughout and assisted in management/protection of LUE. Cueing focused on grading of movement, motor control.  See FIM for current functional status  Therapy/Group: Co-Treatment  Tanielle Emigh A 04/22/2014, 4:15 PM

## 2014-04-22 NOTE — Progress Notes (Signed)
Nursing Note: lab to come back and draw potassium as protocol is to wait 2 hours after administration to draw f/u lab.wbb

## 2014-04-22 NOTE — Plan of Care (Signed)
Problem: RH Balance Goal: LTG Patient will maintain dynamic standing with ADLs (OT) LTG: Patient will maintain dynamic standing balance with assist during activities of daily living (OT)  Downgraded due to bilateral knee pain and decreased standing balance.  Problem: RH Bathing Goal: LTG Patient will bathe with assist, cues/equipment (OT) LTG: Patient will bathe specified number of body parts with assist with/without cues using equipment (position) (OT)  Downgraded due to bilateral knee pain and decreased standing balance.  Problem: RH Dressing Goal: LTG Patient will perform lower body dressing w/assist (OT) LTG: Patient will perform lower body dressing with assist, with/without cues in positioning using equipment (OT)  Downgraded due to bilateral knee pain and decreased standing balance.  Problem: RH Toileting Goal: LTG Patient will perform toileting w/assist, cues/equip (OT) LTG: Patient will perform toiletiing (clothes management/hygiene) with assist, with/without cues using equipment (OT)  Downgraded due to bilateral knee pain and decreased standing balance.  Problem: RH Toilet Transfers Goal: LTG Patient will perform toilet transfers w/assist (OT) LTG: Patient will perform toilet transfers with assist, with/without cues using equipment (OT)  Downgraded due to bilateral knee pain and decreased standing balance.  Problem: RH Tub/Shower Transfers Goal: LTG Patient will perform tub/shower transfers w/assist (OT) LTG: Patient will perform tub/shower transfers with assist, with/without cues using equipment (OT)  Downgraded due to bilateral knee pain and decreased standing balance.

## 2014-04-22 NOTE — Progress Notes (Signed)
Advanced Heart Failure Rounding Note   Subjective:   Mr Mabey is 63 y.o. male with past medical history of severe aortic stenosis s/p tissue AV replacement (09/2012) atrial fibrillation s/p MAZE (09/2012) chronic combined systolic/diastolic HF, RV failure, HTN, PAD s/p L iliac stent in 2010 and aortobifemoral bypass graft, ETOH abuse, severe COPD with cor pulmonale, CAD s/p CABG with AV replacement and back pain from spinal stenosis. He also has a history of acute renal failure complicated by listeria bacteremia and Afib RVR 09/2013. Marland KitchenHe had TEE DC-CV but went back into afib with controlled rate on amiodarone and eliquis.   Admitted to [facility] 04/07/14 with left sided weakness CT of head showed right basal ganglial ICH with interventricular extension and 90mm of midline shift noted. Anticoagulants reversed. Plan to reconsider anticoagulants in 3-4 weeks if CT head is normal. Once stable, he was transferred to CIR. He was admitted to [facility] 04/13/14. Has been followed closely by PT/OTfor ongoing therapry. Has ongoing L hemiparesis and hemisensory loss.   Yesterday he received IV fluid due to elevated creatinine. K supplemented. Weight was down. Diuretics held.     Creatinine  04/09/14 Creatinine1.35 BUN 18 04/21/14 Creatinine 2.0 BUN 119  04/22/14 Creatinine 1.97 K 3.1 Dig level 1.2     Objective:   Weight Range:  Vital Signs:   Temp:  [97.5 F (36.4 C)-98.6 F (37 C)] 97.5 F (36.4 C) (12/30 0554) Pulse Rate:  [60-80] 60 (12/30 0657) Resp:  [17-20] 20 (12/30 0554) BP: (92-125)/(50-78) 122/78 mmHg (12/30 0757) SpO2:  [94 %-99 %] 99 % (12/30 0554) Weight:  [194 lb 14.2 oz (88.4 kg)-212 lb 14.4 oz (96.571 kg)] 194 lb 14.2 oz (88.4 kg) (12/30 0451) Last BM Date: 04/22/14  Weight change: Filed Weights   04/15/14 1401 04/21/14 1853 04/22/14 0451  Weight: 211 lb 12.8 oz (96.072 kg) 212 lb 14.4 oz (96.571 kg) 194 lb 14.2 oz (88.4 kg)    Intake/Output:   Intake/Output Summary (Last  24 hours) at 04/22/14 0847 Last data filed at 04/22/14 0438  Gross per 24 hour  Intake    380 ml  Output   1700 ml  Net  -1320 ml    Physical Exam: General: Chronically ill appearing. No resp difficulty. Lying in bed. HEENT: normal Neck: supple. JVP flat . Carotids 2+ bilat; no bruits. No lymphadenopathy or thryomegaly appreciated. Cor: PMI nondisplaced. Regular rate & rhythm. No rubs, gallops or murmurs. Lungs: clear on 2 liters  Abdomen: soft, nontender, distended. No hepatosplenomegaly. No bruits or masses. Good bowel sounds. Extremities: no cyanosis, clubbing, rash, edema. LUE LLE weakness noted.  Neuro: alert & orientedx3, cranial nerves grossly intact. moves all 4 extremities w/o difficulty. Affect pleasant   Labs: Basic Metabolic Panel:  Recent Labs Lab 04/21/14 1033 04/21/14 1230 04/21/14 1900 04/21/14 2240 04/22/14 0400  NA 126* 129* 127* 130* 130*  K 2.0* 2.2* 2.5* 2.8* 3.1*  CL 79* 80* 83* 86* 90*  CO2 31 27 28 31 29   GLUCOSE 188* 150* 190* 152* 162*  BUN 119* 122* 123* 118* 112*  CREATININE 2.46* 2.36* 2.28* 2.12* 1.97*  CALCIUM 8.5 8.6 8.1* 8.2* 8.1*    Liver Function Tests: No results for input(s): AST, ALT, ALKPHOS, BILITOT, PROT, ALBUMIN in the last 168 hours. No results for input(s): LIPASE, AMYLASE in the last 168 hours. No results for input(s): AMMONIA in the last 168 hours.  CBC: No results for input(s): WBC, NEUTROABS, HGB, HCT, MCV, PLT in the last 168 hours.  Cardiac Enzymes: No results for input(s): CKTOTAL, CKMB, CKMBINDEX, TROPONINI in the last 168 hours.  BNP: BNP (last 3 results)  Recent Labs  09/19/13 1941 09/24/13 0400 01/14/14 1050  PROBNP 3530.0* 1792.0* 872.6*     Other results:  EKG:   Imaging:  No results found.   Medications:     Scheduled Medications: . antiseptic oral rinse  7 mL Mouth Rinse BID  . atorvastatin  80 mg Oral q1800  . budesonide-formoterol  2 puff Inhalation BID  . carvedilol  12.5 mg  Oral BID WC  . diclofenac sodium  2 g Topical QID  . digoxin  0.25 mg Oral QODAY  . feeding supplement (ENSURE COMPLETE)  237 mL Oral BID BM  . feeding supplement (PRO-STAT SUGAR FREE 64)  30 mL Oral BID BM  . feeding supplement (RESOURCE BREEZE)  1 Container Oral TID BM  . mirtazapine  7.5 mg Oral QHS  . MUSCLE RUB   Topical TID WC  . pantoprazole  40 mg Oral Daily  . potassium chloride SA  40 mEq Oral BID  . tiZANidine  8 mg Oral BID     Infusions: . 0.9 % NaCl with KCl 20 mEq / L 75 mL/hr at 04/21/14 2043     PRN Medications:  acetaminophen, alum & mag hydroxide-simeth, bisacodyl, diphenhydrAMINE, guaiFENesin-dextromethorphan, prochlorperazine **OR** prochlorperazine **OR** prochlorperazine, traMADol, traZODone   Assessment/Plan   Mr Foy [place] is 63 year old with known biventricular HF and chronic A fib followed closely in the HF clinc. Admitted CIR after ICH requiring ongoing trehabilitation due deficits noted on the Left. The HF team was asked to consult by Dr Naaman Plummer for HF and worsening renal function.   1. ICH: off anticoagulants. Plan to repeat CT of head 3-4 weeks to reconsider anticoagulants.  2. Chronic Biventricular HF R>L NICM, EF 40-45%, TAPSE < 1.0 (09/2013).  Weight has dropped significantly over the last week (16 pounds down). Weights difficult to follow due to bed weights.  Renal function coming down after receiving IV fluids and stopping diuretics. Keep off diuretics for now.  Allow weight to drift up. Continue carvedilol 12.5 mg twice a day. Dig level 1.2 . Stop dig restart 0.125 mg on Friday.  Will need daily weights. Strict I/Os. 3. Severe COPD with cor pulmonale: on chronic oxygen.  4 Chronic A fib s/p MAZE: failed DC-CV (09/2013). Today EKG appears to be NSR. Not on anticoagulants as above (ICH). Per Neuro will need repeat CT before considering anticoagulants. However, given ICH on Eliquis (lower risk for ICH than warfarin), I am concerned that restarting  anticoagulation will be high risk.  5. AKI: Suspect prerenal azotemia. Creatinine trending down. Hold diuretics and hydrate.  6. Hypokalemia: K 2.0>3.1  K is being repleted.  7. Hyponatremia: Suspect hypovolemic hyponatremia (use of metolazone - thiazide diuretic - also likely contributes). Improving.    Length of Stay: 9   CLEGG,AMY NP-C  04/22/2014, 8:47 AM  Advanced Heart Failure Team Pager [phone] (M-F; 7a - 4p)  Please contact [facility] for night-coverage after hours (4p -7a ) and weekends on [email or web address]  Patient seen with NP, agree with the above note.  He is improving with IV hydration.  Sodium much improved, BUN/creatinine coming down.  He is not volume overloaded on exam.  Continue gentle IV fluid NS @ 75 cc/hr.  Replete K.  Holding digoxin for now, will restart probably Friday.   Loralie Champagne 04/22/2014 9:27 AM

## 2014-04-22 NOTE — Plan of Care (Signed)
Problem: RH Wheelchair Mobility Goal: LTG Patient will propel w/c in community environment (PT) LTG: Patient will propel wheelchair in community environment, # of feet with assist (PT)  Outcome: Not Applicable Date Met:  11/57/26 N/A due to decreased cardiovascular endurance.

## 2014-04-22 NOTE — Progress Notes (Signed)
Nursing Note: One run of potassium IVP 10 meq  per orders given.wbb

## 2014-04-22 NOTE — Plan of Care (Signed)
Problem: RH BOWEL ELIMINATION Goal: RH STG MANAGE BOWEL WITH ASSISTANCE STG Manage Bowel with mod Assistance.  Outcome: Not Progressing Had multiple incontinent stools overnight

## 2014-04-23 ENCOUNTER — Encounter (HOSPITAL_COMMUNITY): Payer: Self-pay

## 2014-04-23 ENCOUNTER — Inpatient Hospital Stay (HOSPITAL_COMMUNITY): Payer: BLUE CROSS/BLUE SHIELD | Admitting: Speech Pathology

## 2014-04-23 ENCOUNTER — Inpatient Hospital Stay (HOSPITAL_COMMUNITY): Payer: BLUE CROSS/BLUE SHIELD | Admitting: Occupational Therapy

## 2014-04-23 ENCOUNTER — Encounter (HOSPITAL_COMMUNITY): Payer: Self-pay | Admitting: Occupational Therapy

## 2014-04-23 ENCOUNTER — Inpatient Hospital Stay (HOSPITAL_COMMUNITY): Payer: Self-pay | Admitting: Physical Therapy

## 2014-04-23 DIAGNOSIS — J439 Emphysema, unspecified: Secondary | ICD-10-CM

## 2014-04-23 LAB — BASIC METABOLIC PANEL
Anion gap: 10 (ref 5–15)
BUN: 60 mg/dL — ABNORMAL HIGH (ref 6–23)
CALCIUM: 8.2 mg/dL — AB (ref 8.4–10.5)
CO2: 26 mmol/L (ref 19–32)
Chloride: 100 mEq/L (ref 96–112)
Creatinine, Ser: 1.45 mg/dL — ABNORMAL HIGH (ref 0.50–1.35)
GFR calc Af Amer: 57 mL/min — ABNORMAL LOW (ref >90)
GFR, EST NON AFRICAN AMERICAN: 49 mL/min — AB (ref >90)
GLUCOSE: 120 mg/dL — AB (ref 70–99)
POTASSIUM: 3.4 mmol/L — AB (ref 3.5–5.1)
Sodium: 136 mmol/L (ref 135–145)

## 2014-04-23 MED ORDER — POTASSIUM CHLORIDE CRYS ER 20 MEQ PO TBCR
40.0000 meq | EXTENDED_RELEASE_TABLET | Freq: Once | ORAL | Status: AC
  Administered 2014-04-23: 40 meq via ORAL

## 2014-04-23 MED ORDER — DIGOXIN 125 MCG PO TABS
0.1250 mg | ORAL_TABLET | ORAL | Status: DC
Start: 2014-04-24 — End: 2014-05-05
  Administered 2014-04-24 – 2014-05-04 (×6): 0.125 mg via ORAL
  Filled 2014-04-23 (×8): qty 1

## 2014-04-23 NOTE — Progress Notes (Signed)
Social Work Patient ID: [name], male   DOB: 13-Jan-1951, 63 y.o.   MRN: [id number]   Met with pt this afternoon to review team conference and concerns about his anticipated care needs upon d/c.  Pt understands that most goals were downgraded to min assistance and that recommendation for 24/7 care.  He reports that he does not have this level of assist but wants me to follow up with his daughter.  He is agreed that he will likely need to d/c to SNF, but reports he is changing to a new insurance on 1/1 [facility]).  Plan to follow up with daughter on Monday and confirm need to change plan to SNF.  Falan Hensler, LCSW

## 2014-04-23 NOTE — Consult Note (Signed)
  INITIAL DIAGNOSTIC EVALUATION - CONFIDENTIAL [facility]   MEDICAL NECESSITY:  [name] was seen on the [facility] for an initial diagnostic evaluation owing to the patient's diagnosis of intracerebral hemorrhage.   According to medical records, [name] was admitted to the rehab unit owing to "Functional deficits secondary to right BG/IC hemorrhage with left hemiparesis and hemi-sensory loss." Records also indicate that he is a "63 y.o. right handed male [with] history of HTN, COPD-oxygen dependent, A fib on Eliquis, s/p MAZE June 2014. He was independent prior to admission, lives alone and was using a walker due to back pain.  He was admitted on 04/07/2014 with left-sided weakness and mild slurred speech. CCT showed a right IC basal ganglia hemorrhage with a 4 mm right to left midline shift and BP 158/85 at admission. Eliquis was reversed with Kcentra and BP monitored closely. Recommendations to repeat CCT in 3-4 weeks to help determine ability to resume Eliquis.    During today's visit, [name] denied suffering any cognitive difficulty. He described his current mood as "up and down" but he is generally positive. He admitted to being somewhat more irritable and agitated with new people. He has no history of mental health issues or treatment. No adjustment issues endorsed regarding this admission. Suicidal/homicidal ideation, plan or intent was denied. No manic or hypomanic episodes were reported. The patient denied ever experiencing any auditory/visual hallucinations. No major behavioral or personality changes were endorsed.   [name] at first did not feel that he was making progress in therapy but he now realizes that he is. He described the rehab staff as "great" and he further went on to Sparks that he has great confidence in them. No barriers to therapy identified. He has family and friends to help him transition home where he  was living alone and independently. He has some trepidation about discharging home.   PROCEDURES ADMINISTERED: [1 unit [id number] on 04/23/14] Diagnostic clinical interview  Review of available records  Behavioral Evaluation: [name] was appropriately dressed for season and situation, and he appeared tidy and well-groomed. Normal posture was noted. He was friendly and rapport was easily established. His speech was as expected and he was able to express ideas effectively. His affect was appropriately modulated. Attention and motivation were good.    IMPRESSION: Overall, [name] endorsed suffering from a mild adjustment reaction in light of his present medical situation. No major cognitive issues were reported or observed. Patient has some trepidation about discharging home and social work later informed me that he will be going to skilled nursing. I plan to follow-up with [name] on Monday for supportive psychotherapy. No medication recommendations offered.    RECOMMENDATIONS  . Since emotional factors are adversely impacting the patient, brief counseling for social support seems warranted during this hospitalization. Please place him on my Monday schedule.   . May perform cognitive screen prior to discharge. I will consider at next visit.   DIAGNOSES:  Intracerebral hemorrhage  Adjustment disorder with depressed mood (mild)    Rutha Bouchard, Psy.D.  Clinical Neuropsychologist

## 2014-04-23 NOTE — Progress Notes (Signed)
Advanced Heart Failure Rounding Note   Subjective:   Mr Ulibarri is 63 y.o. male with past medical history of severe aortic stenosis s/p tissue AV replacement (09/2012) atrial fibrillation s/p MAZE (09/2012) chronic combined systolic/diastolic HF, RV failure, HTN, PAD s/p L iliac stent in 2010 and aortobifemoral bypass graft, ETOH abuse, severe COPD with cor pulmonale, CAD s/p CABG with AV replacement and back pain from spinal stenosis. He also has a history of acute renal failure complicated by listeria bacteremia and Afib RVR 09/2013. Marland KitchenHe had TEE DC-CV but went back into afib with controlled rate on amiodarone and eliquis.   Admitted to [facility] 04/07/14 with left sided weakness CT of head showed right basal ganglial ICH with interventricular extension and 58mm of midline shift noted. Anticoagulants reversed. Plan to reconsider anticoagulants in 3-4 weeks if CT head is normal. Once stable, he was transferred to CIR. He was admitted to [facility] 04/13/14. Has been followed closely by PT/OTfor ongoing therapry. Has ongoing L hemiparesis and hemisensory loss.   Yesterday continued IV fluids due to elevated creatinine. Renal function trending back donw. Weight trending up.  Diuretics held.   Feeling better. Denies dyspnea.   04/09/14 Creatinine1.35 BUN 18 04/21/14 Creatinine 2.0 BUN 119  04/22/14 Creatinine 1.97 K 3.1 Dig level 1.2  04/23/14 Creatinine 1.45 K 3.4 NA 136   Objective:   Weight Range:  Vital Signs:   Temp:  [98 F (36.7 C)-98.4 F (36.9 C)] 98 F (36.7 C) (12/31 0543) Pulse Rate:  [67-76] 67 (12/31 0543) Resp:  [16-20] 18 (12/31 0543) BP: (106-123)/(44-54) 123/44 mmHg (12/31 0543) SpO2:  [97 %] 97 % (12/31 0543) Weight:  [204 lb 5.9 oz (92.7 kg)-207 lb 14.3 oz (94.3 kg)] 207 lb 14.3 oz (94.3 kg) (12/31 0543) Last BM Date: 04/22/14  Weight change: Filed Weights   04/22/14 0451 04/22/14 1700 04/23/14 0543  Weight: 194 lb 14.2 oz (88.4 kg) 204 lb 5.9 oz (92.7 kg) 207 lb 14.3 oz  (94.3 kg)    Intake/Output:   Intake/Output Summary (Last 24 hours) at 04/23/14 0842 Last data filed at 04/23/14 5621  Gross per 24 hour  Intake   1200 ml  Output   2202 ml  Net  -1002 ml    Physical Exam: General: Chronically ill appearing. No resp difficulty. Lying in bed. HEENT: normal Neck: supple. JVP flat . Carotids 2+ bilat; no bruits. No lymphadenopathy or thryomegaly appreciated. Cor: PMI nondisplaced. Regular rate & rhythm. No rubs, gallops or murmurs. Lungs: clear on 2 liters  Abdomen: soft, nontender, distended. No hepatosplenomegaly. No bruits or masses. Good bowel sounds. Extremities: no cyanosis, clubbing, rash, edema. LUE LLE weakness noted.  Neuro: alert & orientedx3, cranial nerves grossly intact. moves all 4 extremities w/o difficulty. Affect pleasant   Labs: Basic Metabolic Panel:  Recent Labs Lab 04/21/14 1230 04/21/14 1900 04/21/14 2240 04/22/14 0400 04/23/14 0605  NA 129* 127* 130* 130* 136  K 2.2* 2.5* 2.8* 3.1* 3.4*  CL 80* 83* 86* 90* 100  CO2 27 28 31 29 26   GLUCOSE 150* 190* 152* 162* 120*  BUN 122* 123* 118* 112* 60*  CREATININE 2.36* 2.28* 2.12* 1.97* 1.45*  CALCIUM 8.6 8.1* 8.2* 8.1* 8.2*    Liver Function Tests:  Recent Labs Lab 04/22/14 0400  ALBUMIN 3.2*   No results for input(s): LIPASE, AMYLASE in the last 168 hours. No results for input(s): AMMONIA in the last 168 hours.  CBC: No results for input(s): WBC, NEUTROABS, HGB, HCT, MCV, PLT in  the last 168 hours.  Cardiac Enzymes: No results for input(s): CKTOTAL, CKMB, CKMBINDEX, TROPONINI in the last 168 hours.  BNP: BNP (last 3 results)  Recent Labs  09/19/13 1941 09/24/13 0400 01/14/14 1050  PROBNP 3530.0* 1792.0* 872.6*     Other results:  EKG:   Imaging: No results found.   Medications:     Scheduled Medications: . antiseptic oral rinse  7 mL Mouth Rinse BID  . atorvastatin  80 mg Oral q1800  . budesonide-formoterol  2 puff Inhalation BID   . carvedilol  12.5 mg Oral BID WC  . diclofenac sodium  2 g Topical QID  . feeding supplement (ENSURE COMPLETE)  237 mL Oral BID BM  . feeding supplement (PRO-STAT SUGAR FREE 64)  30 mL Oral BID BM  . feeding supplement (RESOURCE BREEZE)  1 Container Oral TID BM  . mirtazapine  7.5 mg Oral QHS  . MUSCLE RUB   Topical TID WC  . pantoprazole  40 mg Oral Daily  . potassium chloride SA  40 mEq Oral BID  . tiZANidine  8 mg Oral BID    Infusions: . 0.9 % NaCl with KCl 20 mEq / L 75 mL/hr at 04/23/14 0128    PRN Medications: acetaminophen, alum & mag hydroxide-simeth, bisacodyl, diphenhydrAMINE, guaiFENesin-dextromethorphan, prochlorperazine **OR** prochlorperazine **OR** prochlorperazine, traMADol, traZODone   Assessment/Plan   Mr Foy [place] is 63 year old with known biventricular HF and chronic A fib followed closely in the HF clinc. Admitted CIR after ICH requiring ongoing trehabilitation due deficits noted on the Left. The HF team was asked to consult by Dr Naaman Plummer for HF and worsening renal function.   1. ICH: off anticoagulants. Plan to repeat CT of head 3-4 weeks to reconsider anticoagulants.  2. Chronic Biventricular HF R>L NICM, EF 40-45%, TAPSE < 1.0 (09/2013).  Weight has dropped significantly over the last week (16 pounds down). Weights difficult to follow due to bed weights. Stop IV fluids.  Volume status stable. Renal function continues to improved. Keep off diuretics for now.  Allow weight to drift up. Continue carvedilol 12.5 mg twice a day. Restart dig 0.125 mg on Friday. Then every other day. Check dig level next Friday.  Will need daily weights. Strict I/Os. 3. Severe COPD with cor pulmonale: on chronic oxygen.  4 Chronic A fib s/p MAZE: failed DC-CV (09/2013). Today EKG appears to be NSR. Not on anticoagulants as above (ICH). Per Neuro will need repeat CT before considering anticoagulants. However, given ICH on Eliquis (lower risk for ICH than warfarin), I am concerned that  restarting anticoagulation will be high risk.  5. AKI: Suspect prerenal azotemia. Creatinine trending down. Hold diuretics 6. Hypokalemia: K 2.0>3.1>3.4  K is being repleted.  7. Hyponatremia: Suspect hypovolemic hyponatremia (use of metolazone - thiazide diuretic - also likely contributes). Continues to improve.     Length of Stay: 10  CLEGG,AMY NP-C  04/23/2014, 8:42 AM  Advanced Heart Failure Team Pager [phone] (M-F; 7a - 4p)  Please contact [facility] for night-coverage after hours (4p -7a ) and weekends on [email or web address]  Patient seen with NP, agree with the above note.  Creatinine and BUN continue to improve.  Continue to hold the diuretics.  Will follow.   Loralie Champagne 04/23/2014 12:52 PM

## 2014-04-23 NOTE — Progress Notes (Signed)
Recreational Therapy Session Note  Patient Details  Name: [name] MRN: [id number] Date of Birth: 1950-04-30 Today's Date: 04/23/2014  Met with pt on 2 occassions to screen for recreation therapy services.  Pt has limited interest in TR services at this time & is having difficulty tolerating current therapies due to low endurance & pain.  Will continue to monitor through team for future participation.  [place] 04/23/2014, 3:52 PM

## 2014-04-23 NOTE — Progress Notes (Signed)
Day 2 of 3- Calorie Count Note  72 hour calorie count ordered.  Diet: Dysphagia 3 with thin liquids Supplements:  -Resource Breeze po TID, each supplement provides 250 kcal and 9 grams of protein. -Ensure Complete po BID, each supplement provides 350 kcal and 13 grams of protein. - 30 ml Prostat po BID, each supplement provides 100 kcal and 15 grams of protein.  Breakfast: 460 kcal, 25 grams of protein Lunch: 345 kcal, 14 grams of protein Dinner: none recorded, however reported PO intake 75% Supplements: 950 kcal, 47 grams of protein  Total intake: 1755 kcal (88% of minimum estimated needs)  86 grams of protein (91% of minimum estimated needs)  Per RN, meal completion has been improving. Pt has also been drinking his oral supplements and enjoys them. Will continue with current orders. Pt was encouraged to eat his food at meals and to take his supplements.   Nutrition Dx:  Inadequate oral intake related to decreased appetite as evidenced by varied meal completion of 30-100%, improving  Goal:  Pt to meet >/= 90% of their estimated nutrition needs; met  Intervention:   Continue Resource Breeze po TID, each supplement provides 250 kcal and 9 grams of protein.  Continue Ensure Complete po BID, each supplement provides 350 kcal and 13 grams of protein.  Continue 30 ml Prostat po BID, each supplement provides 100 kcal and 15 grams of protein.  Encourage adequate PO intake.   RD to follow up with day 3 of 3 calorie count results on Monday 04/27/14.  Kallie Locks, MS, RD, LDN Pager # [phone] After hours/ weekend pager # [phone]

## 2014-04-23 NOTE — Progress Notes (Signed)
Speech Language Pathology Daily Session Note  Patient Details  Name: [name] MRN: [id number] Date of Birth: 1950-07-11  Today's Date: 04/23/2014 SLP Individual Time: 3570-1779 SLP Individual Time Calculation (min): 30 min  Short Term Goals: Week 2: SLP Short Term Goal 1 (Week 2): Pt will demonstrate improved mastication for toleration of regular textures with Mod I  SLP Short Term Goal 2 (Week 2): Pt will tolerate dys 3 solids and thin liquids with no overt s/s of aspiration and Mod I use of swallowing precautions.  SLP Short Term Goal 3 (Week 2): Pt will complete oral motor exercises targeting left sided weakness to improve mastication and manipulation of regular solid boluses with supervision.  SLP Short Term Goal 4 (Week 2): Pt will improve recall of daily information via use of compensatory strategies over 80% of observable opportunities with supervision cues.  SLP Short Term Goal 5 (Week 2): Pt will improve functional problem solving skills (i.e. planning, thought organization, and error awareness) during tasks over 80% of observable opportunities with supervision cues.   Skilled Therapeutic Interventions: Skilled treatment session focused on addressing cognition goals.  SLP facilitated session with set-up of list and medication management task.  Patient required Mod cues for organize, self-monitor and correct errors during completion of task. Of note, patient independently recalled change that occurred in medications since creation of list, which SLP confirmed via orders.  Patient to complete task upon next visit; continue with current plan of care.   FIM:  Comprehension Comprehension Mode: Auditory Comprehension: 5-Understands basic 90% of the time/requires cueing < 10% of the time Expression Expression Mode: Verbal Expression: 5-Expresses basic needs/ideas: With extra time/assistive device Social Interaction Social Interaction: 5-Interacts appropriately 90% of the time -  Needs monitoring or encouragement for participation or interaction. Problem Solving Problem Solving: 3-Solves basic 50 - 74% of the time/requires cueing 25 - 49% of the time Memory Memory: 5-Recognizes or recalls 90% of the time/requires cueing < 10% of the time  Pain Pain Assessment Pain Assessment: No/denies pain  Therapy/Group: Individual Therapy  Carmelia Roller., [place] [phone]  [place] 04/23/2014, 4:09 PM

## 2014-04-23 NOTE — Progress Notes (Signed)
Occupational Therapy Session Note  Patient Details  Name: [name] MRN: [id number] Date of Birth: Sep 19, 1950  Today's Date: 04/23/2014 OT Individual Time: 0800-0900 OT Individual Time Calculation (min): 60 min    Short Term Goals: Week 2:  OT Short Term Goal 1 (Week 2): Pt will complete bathing at sit <> stand level with max assist of 1 person OT Short Term Goal 2 (Week 2): Pt will complete UB dressing with min assist while seated unsupported OT Short Term Goal 3 (Week 2): Pt will complete LB dressing at sit <> stand level with max assist of 1 person OT Short Term Goal 4 (Week 2): Pt will complete toilet transfer to [facility] with mod assist of 1 person  Skilled Therapeutic Interventions/Progress Updates:    Engaged in ADL retraining with focus on bed mobility, transfers, and functional use of LUE during self-care tasks.  Pt in bed upon arrival, completed perineal hygiene at bed level with rolling Rt and Lt with min cues for attention to LUE and positioning of LUE prior to mobility.  Engaged in squat pivot from bed > w/c with max assist with multimodal cues for weight shift and motor control during transfer.  UB bathing completed seated at sink with increased focus on functional use of LUE with obtaining washcloth and functional use during bathing.    Therapy Documentation Precautions:  Precautions Precautions: Fall Restrictions Weight Bearing Restrictions: No General:   Vital Signs:  Pain: Pain Assessment Pain Assessment: 0-10 Pain Score: 2  Pain Type: Acute pain Pain Location: Shoulder Pain Orientation: Left Pain Descriptors / Indicators: Aching Pain Frequency: Intermittent Pain Onset: With Activity Patients Stated Pain Goal: 3 Pain Intervention(s): Medication (See eMAR)  See FIM for current functional status  Therapy/Group: Individual Therapy  Simonne Come 04/23/2014, 12:28 PM

## 2014-04-23 NOTE — Progress Notes (Signed)
Occupational Therapy Session Note  Patient Details  Name: [name] MRN: [id number] Date of Birth: 11-04-1950  Today's Date: 04/23/2014 OT Individual Time: 1400-1430 OT Individual Time Calculation (min): 30 min    Short Term Goals: Week 2:  OT Short Term Goal 1 (Week 2): Pt will complete bathing at sit <> stand level with max assist of 1 person OT Short Term Goal 2 (Week 2): Pt will complete UB dressing with min assist while seated unsupported OT Short Term Goal 3 (Week 2): Pt will complete LB dressing at sit <> stand level with max assist of 1 person OT Short Term Goal 4 (Week 2): Pt will complete toilet transfer to [facility] with mod assist of 1 person  Skilled Therapeutic Interventions/Progress Updates:  Pt transitioning easily from PT session but with c/o fatigue. Skilled OT session with focus on bilateral [facility] task, functional transfer, safety awareness, and bed mobility. Pt attempted to tie shoe laces in lap and he was able to complete first step of crossing laces and pulling tight but unable to make "loops" for next step. Pt was able to utilize B hands to open 8 small snaps of hospital down and then snap 4 of them back together with focus on L hand holding onto fabric while R hand pulled or pushed snap. OT discussed energy conservation education with pt as he continued to require rest breaks throughout session. Education to continue on this topic. Pt transfer to L side into bed with 2nd helper to assist and therapist performing Bobath technique. Pt with heavy lean towards L during transfer requiring max multimodal cues for safety. Pt requiring verbal cues during transfer to hold onto L hand for safety and awareness. Pt required assistance with trunk and L LE into bed with sit >supine being mod A. Pt in bed with head elevated resting with call bell within reach upon exiting the room.  Therapy Documentation Precautions:  Precautions Precautions: Fall Restrictions Weight Bearing  Restrictions: No  See FIM for current functional status  Therapy/Group: Individual Therapy  Phineas Semen 04/23/2014, 2:32 PM

## 2014-04-23 NOTE — Progress Notes (Signed)
Physical Therapy Session Note  Patient Details  Name: [name] MRN: [id number] Date of Birth: 1951-02-15  Today's Date: 04/23/2014 PT Individual Time: 1300-1400 PT Individual Time Calculation (min): 60 min   Short Term Goals: Week 2:  PT Short Term Goal 1 (Week 2): Pt will perform supine > sit with mod A with HOB flat using bed rail. PT Short Term Goal 2 (Week 2): Pt will perform sit > supine with HOB flat and mod A. PT Short Term Goal 3 (Week 2): Pt will consistently transfer from bed<>w/c with mod A of single therapist. PT Short Term Goal 4 (Week 2): Pt will perform w/c mobility x75' with supervision and 25% cueing for technique.  Skilled Therapeutic Interventions/Progress Updates:    2:1. Pt received seated in w/c on 2 L/min supplemental O2, [place]. Pt demonstrating improved affect and reporting no pain at rest; agreeable to therapy. Transported pt to gym in w/c with total A for energy conservation. Performed squat pivot transfers from w/c > mat table (to L side) with max A and from mat table > w/c (to R side) with Total A. Donned L hinged knee brace and L Giv[employer] sling. Orthotist present to assess pt's R knee posture in standing to determine appropriate orthosis. Pt attempted sit<>stand from elevated mat table with +2A; however, pt unable to achieve full standing secondary to difficulty grading movement, overcompensation with RUE/LE, and decreased awareness of LUE/LLE. Therefore, transitioned to sit<>stand using Stedy to increase kinesthetic awareness, midline orientation. Orthotist donned hyperextension brace at R knee. Pt performed sit<>stand, static standing trials x3 total with Stedy requiring mod A overall, +2A for safety. PT positioned at pt's L side providing manual facilitation of full anterior weight shift during sit > stand, tactile cueing at L knee for increased weightbearing, proprioception, and multimodal cueing for LLE stance stability (focus on hip extension) once in standing.  While wearing R knee hyperextension brace,pt exhibited no R knee hyperextension, reported no pain, and expressed feeling "really stable" in R knee during standing. Pt, orthotist, and this PT in agreement to order said brace to maximize safety with functional mobility. Session ended in pt room, where pt was left seated in w/c with L half lap tray on, 2 L/min supplemental O2 with SpO2 99%, and all needs within reach.  Therapy Documentation Precautions:  Precautions Precautions: Fall Restrictions Weight Bearing Restrictions: No Vital Signs: Therapy Vitals Temp: 98.4 F (36.9 C) Temp Source: Oral Pulse Rate: 61 Resp: 18 BP: (!) 133/52 mmHg Patient Position (if appropriate): Lying Oxygen Therapy SpO2: 99 % O2 Device: Nasal Cannula O2 Flow Rate (L/min): 2 L/min Pain: Pain Assessment Pain Assessment: No/denies pain  See FIM for current functional status  Therapy/Group: Individual Therapy  [place], Malva Cogan 04/23/2014, 4:04 PM

## 2014-04-23 NOTE — Progress Notes (Signed)
63 y.o. right handed male history of HTN, COPD-oxygen dependent, A fib on Eliquis, s/p MAZE June 2014. He was independent prior to admission, lives alone and was using a walker due to back pain.  He was admitted on 04/07/2014 past  with left-sided weakness and mild slurred speech. CCT showed a right IC basal ganglia hemorrhage with a 4 mm right to left midline shift and BP 158/85 at admission. Eliquis was  reversed with Kcentra and BP monitored closely. Recommendations to repeat CCT in 3-4 weeks to help determine ability to resume Eliquis.  Repeat carotid dopplers done revealing 60- 79% L-ICA and 40- 59% R-ICA stenosis   Subjective/Complaints: Up eating breakfast. Feeling better. Denies current pain. Sob at baseline Review of Systems - Negative except on O2 all the time   Objective: Vital Signs: Blood pressure 123/44, pulse 67, temperature 98 F (36.7 C), temperature source Oral, resp. rate 18, height '5\' 10"'  (1.778 m), weight 94.3 kg (207 lb 14.3 oz), SpO2 97 %. No results found. Results for orders placed or performed during the hospital encounter of 04/13/14 (from the past 72 hour(s))  Basic metabolic panel     Status: Abnormal   Collection Time: 04/21/14 10:33 AM  Result Value Ref Range   Sodium 126 (L) 135 - 145 mmol/L    Comment: Please note change in reference range. DELTA CHECK NOTED    Potassium 2.0 (LL) 3.5 - 5.1 mmol/L    Comment: CRITICAL RESULT CALLED TO, READ BACK BY AND VERIFIED WITH: Leonie Green RN 04/21/14 1157 COSTELLO B RESULTS VERIFIED VIA RECOLLECT Please note change in reference range. REPEATED TO VERIFY    Chloride 79 (L) 96 - 112 mEq/L   CO2 31 19 - 32 mmol/L   Glucose, Bld 188 (H) 70 - 99 mg/dL   BUN 119 (H) 6 - 23 mg/dL    Comment: RESULTS VERIFIED VIA RECOLLECT   Creatinine, Ser 2.46 (H) 0.50 - 1.35 mg/dL   Calcium 8.5 8.4 - 10.5 mg/dL   GFR calc non Af Amer 26 (L) >90 mL/min   GFR calc Af Amer 30 (L) >90 mL/min    Comment: (NOTE) The eGFR has been  calculated using the CKD EPI equation. This calculation has not been validated in all clinical situations. eGFR's persistently <90 mL/min signify possible Chronic Kidney Disease. CORRECTED ON 12/29 AT 1452: PREVIOUSLY REPORTED AS 30    Anion gap 16 (H) 5 - 15  Basic metabolic panel     Status: Abnormal   Collection Time: 04/21/14 12:30 PM  Result Value Ref Range   Sodium 129 (L) 135 - 145 mmol/L    Comment: Please note change in reference range.   Potassium 2.2 (LL) 3.5 - 5.1 mmol/L    Comment: Please note change in reference range. REPEATED TO VERIFY CRITICAL RESULT CALLED TO, READ BACK BY AND VERIFIED WITH: V.WASHINGTON,RN 04/21/14 1400 BY BSLADE    Chloride 80 (L) 96 - 112 mEq/L   CO2 27 19 - 32 mmol/L   Glucose, Bld 150 (H) 70 - 99 mg/dL   BUN 122 (H) 6 - 23 mg/dL   Creatinine, Ser 2.36 (H) 0.50 - 1.35 mg/dL    Comment: RESULTS VERIFIED VIA RECOLLECT   Calcium 8.6 8.4 - 10.5 mg/dL   GFR calc non Af Amer 27 (L) >90 mL/min   GFR calc Af Amer 32 (L) >90 mL/min    Comment: (NOTE) The eGFR has been calculated using the CKD EPI equation. This calculation has not been validated  in all clinical situations. eGFR's persistently <90 mL/min signify possible Chronic Kidney Disease.    Anion gap 22 (H) 5 - 15  Basic metabolic panel     Status: Abnormal   Collection Time: 04/21/14  7:00 PM  Result Value Ref Range   Sodium 127 (L) 135 - 145 mmol/L    Comment: Please note change in reference range.   Potassium 2.5 (LL) 3.5 - 5.1 mmol/L    Comment: Please note change in reference range. REPEATED TO VERIFY CRITICAL RESULT CALLED TO, READ BACK BY AND VERIFIED WITH: B BAKER,RN 1947 04/21/14 WBOND    Chloride 83 (L) 96 - 112 mEq/L   CO2 28 19 - 32 mmol/L   Glucose, Bld 190 (H) 70 - 99 mg/dL   BUN 123 (H) 6 - 23 mg/dL   Creatinine, Ser 2.28 (H) 0.50 - 1.35 mg/dL   Calcium 8.1 (L) 8.4 - 10.5 mg/dL   GFR calc non Af Amer 29 (L) >90 mL/min   GFR calc Af Amer 33 (L) >90 mL/min     Comment: (NOTE) The eGFR has been calculated using the CKD EPI equation. This calculation has not been validated in all clinical situations. eGFR's persistently <90 mL/min signify possible Chronic Kidney Disease.    Anion gap 16 (H) 5 - 15  Basic metabolic panel     Status: Abnormal   Collection Time: 04/21/14 10:40 PM  Result Value Ref Range   Sodium 130 (L) 135 - 145 mmol/L    Comment: Please note change in reference range.   Potassium 2.8 (L) 3.5 - 5.1 mmol/L    Comment: Please note change in reference range.   Chloride 86 (L) 96 - 112 mEq/L   CO2 31 19 - 32 mmol/L   Glucose, Bld 152 (H) 70 - 99 mg/dL   BUN 118 (H) 6 - 23 mg/dL   Creatinine, Ser 2.12 (H) 0.50 - 1.35 mg/dL   Calcium 8.2 (L) 8.4 - 10.5 mg/dL   GFR calc non Af Amer 31 (L) >90 mL/min   GFR calc Af Amer 36 (L) >90 mL/min    Comment: (NOTE) The eGFR has been calculated using the CKD EPI equation. This calculation has not been validated in all clinical situations. eGFR's persistently <90 mL/min signify possible Chronic Kidney Disease.    Anion gap 13 5 - 15  Digoxin level     Status: None   Collection Time: 04/22/14  4:00 AM  Result Value Ref Range   Digoxin Level 1.2 0.8 - 2.0 ng/mL  Basic metabolic panel     Status: Abnormal   Collection Time: 04/22/14  4:00 AM  Result Value Ref Range   Sodium 130 (L) 135 - 145 mmol/L    Comment: Please note change in reference range.   Potassium 3.1 (L) 3.5 - 5.1 mmol/L    Comment: Please note change in reference range.   Chloride 90 (L) 96 - 112 mEq/L   CO2 29 19 - 32 mmol/L   Glucose, Bld 162 (H) 70 - 99 mg/dL   BUN 112 (H) 6 - 23 mg/dL   Creatinine, Ser 1.97 (H) 0.50 - 1.35 mg/dL   Calcium 8.1 (L) 8.4 - 10.5 mg/dL   GFR calc non Af Amer 34 (L) >90 mL/min   GFR calc Af Amer 40 (L) >90 mL/min    Comment: (NOTE) The eGFR has been calculated using the CKD EPI equation. This calculation has not been validated in all clinical situations. eGFR's persistently <90 mL/min  signify possible Chronic Kidney Disease.    Anion gap 11 5 - 15  Albumin     Status: Abnormal   Collection Time: 04/22/14  4:00 AM  Result Value Ref Range   Albumin 3.2 (L) 3.5 - 5.2 g/dL  Basic metabolic panel     Status: Abnormal   Collection Time: 04/23/14  6:05 AM  Result Value Ref Range   Sodium 136 135 - 145 mmol/L    Comment: Please note change in reference range.   Potassium 3.4 (L) 3.5 - 5.1 mmol/L    Comment: Please note change in reference range.   Chloride 100 96 - 112 mEq/L   CO2 26 19 - 32 mmol/L   Glucose, Bld 120 (H) 70 - 99 mg/dL   BUN 60 (H) 6 - 23 mg/dL   Creatinine, Ser 1.45 (H) 0.50 - 1.35 mg/dL   Calcium 8.2 (L) 8.4 - 10.5 mg/dL   GFR calc non Af Amer 49 (L) >90 mL/min   GFR calc Af Amer 57 (L) >90 mL/min    Comment: (NOTE) The eGFR has been calculated using the CKD EPI equation. This calculation has not been validated in all clinical situations. eGFR's persistently <90 mL/min signify possible Chronic Kidney Disease.    Anion gap 10 5 - 15     HEENT: normal and O2 [place] Cardio: RRR and murmur Resp: CTA B/L and unlabored, no rales or wheezes GI: BS positive and NT, ND Extremity:  No Edema Skin:   Bruise multiple eccymotic areas in BUE and BLE Neuro: Alert/Oriented, Cranial Nerve II-XII normal, Abnormal Sensory reduced on Left, Abnormal Motor 3/5 LUE, 3- Left HF, 4/5 L KE, 3- L ankle DF and Abnormal FMC Ataxic/ dec FMC Musc/Skel:  Other No swelling or pain with AROM Right knee Gen NAD Psych: pleasant and appropriate   Assessment/Plan: 1. Functional deficits secondary to right BG/IC hemorrhage with left hemiparesis and hemisensory loss which require 3+ hours per day of interdisciplinary therapy in a comprehensive inpatient rehab setting. Physiatrist is providing close team supervision and 24 hour management of active medical problems listed below. Physiatrist and rehab team continue to assess barriers to discharge/monitor patient progress toward  functional and medical goals.  FIM: FIM - Bathing Bathing Steps Patient Completed: Chest, Left Arm, Abdomen, Front perineal area, Right upper leg, Left upper leg, Right Arm Bathing: 3: Mod-Patient completes 5-7 50f10 parts or 50-74%  FIM - Upper Body Dressing/Undressing Upper body dressing/undressing steps patient completed: Thread/unthread right sleeve of pullover shirt/dresss, Thread/unthread left sleeve of pullover shirt/dress, Put head through opening of pull over shirt/dress Upper body dressing/undressing: 0: Wears gown/pajamas-no public clothing FIM - Lower Body Dressing/Undressing Lower body dressing/undressing steps patient completed: Thread/unthread right pants leg Lower body dressing/undressing: 0: Wears gInterior and spatial designer FIM - TMusicianDevices: Grab bar or rail for support Toileting: 0: Activity did not occur  FIM - TRadio producerDevices: Bedside commode, Grab bars Toilet Transfers: 0-Activity did not occur  FIM - BControl and instrumentation engineerDevices: Bed rails Bed/Chair Transfer: 2: Bed > Chair or W/C: Max A (lift and lower assist)  FIM - Locomotion: Wheelchair Distance: 55 Locomotion: Wheelchair: 1: Total Assistance/staff pushes wheelchair (Pt<25%) FIM - Locomotion: Ambulation Locomotion: Ambulation Assistive Devices: Orthosis, Other (comment) (R Swedish knee cage; L hinged knee brace) Ambulation/Gait Assistance: 1: +2 Total assist, Other (comment) (+3A (3 musketeers and additional person for w/c follow and O2 management)) Locomotion: Ambulation: 0: Activity did not occur  Comprehension Comprehension Mode: Auditory Comprehension: 5-Follows basic conversation/direction: With no assist  Expression Expression Mode: Verbal Expression: 5-Expresses basic needs/ideas: With extra time/assistive device  Social Interaction Social Interaction Mode: Asleep Social Interaction: 4-Interacts  appropriately 75 - 89% of the time - Needs redirection for appropriate language or to initiate interaction.  Problem Solving Problem Solving: 5-Solves basic 90% of the time/requires cueing < 10% of the time  Memory Memory: 5-Recognizes or recalls 90% of the time/requires cueing < 10% of the time  Medical Problem List and Plan: 1. Functional deficits secondary to right BG/IC hemorrhage with left hemiparesis and hemisensory loss 2.  DVT Prophylaxis/Anticoagulation: Continue SCDs. Add TEDs. Monitor for any signs of DVT. Check follow up CT head early Jan.   3. Pain Management/chronic back pain: Tylenol as needed for pain. Add voltaren gel. Continue Zanaflex 8 mg twice a day 4. Dysphagia. Dysphagia 3 nectar liquids. Adv per slp 5. Neuropsych: This patient is capable of making decisions on her own behalf. 6. Skin/Wound Care: Routine skin checks 7. Fluids/Electrolytes/Nutrition: ivf at 75/hr. Encourage po 8. Hypertension/CV. Monitor every 8 hours. Continue Coreg 12.5 mg twice a day, Aldactone 25 mg daily, Demadex as directed, Zaroxolyn 2.5 mg Monday Wednesday Friday.   -continue to hold diuretics  -potassium  supplementation   -stop IVF  -daily bmet, weights  -encouraging intake---appears to be picking up  -appreciate cards follow up 9. Atrial fibrillation/status post Maze procedure June 2014: Eliquis on hold secondary to hemorrhage. Continue Lanoxin 0.25 mg every other day. Monitor HR tid.   10. Hyperlipidemia. Lipitor 11. History of gouty arthritis: held colchicine due to diarrhea . 12. COPD: Respiratory status stable. Continue Symbicort twice daily. 13. H/o  Peripheral edema with weeping:   Low salt diet.     14. Constipation: ----improved   LOS (Days) 10 A FACE TO FACE EVALUATION WAS PERFORMED  Chardonay Scritchfield T 04/23/2014, 8:41 AM

## 2014-04-24 ENCOUNTER — Encounter (HOSPITAL_COMMUNITY): Payer: Self-pay | Admitting: Occupational Therapy

## 2014-04-24 DIAGNOSIS — I5022 Chronic systolic (congestive) heart failure: Secondary | ICD-10-CM | POA: Diagnosis present

## 2014-04-24 LAB — BASIC METABOLIC PANEL
Anion gap: 7 (ref 5–15)
BUN: 41 mg/dL — ABNORMAL HIGH (ref 6–23)
CHLORIDE: 100 meq/L (ref 96–112)
CO2: 24 mmol/L (ref 19–32)
Calcium: 8.1 mg/dL — ABNORMAL LOW (ref 8.4–10.5)
Creatinine, Ser: 1.35 mg/dL (ref 0.50–1.35)
GFR calc Af Amer: 63 mL/min — ABNORMAL LOW (ref >90)
GFR calc non Af Amer: 54 mL/min — ABNORMAL LOW (ref >90)
Glucose, Bld: 157 mg/dL — ABNORMAL HIGH (ref 70–99)
Potassium: 3.9 mmol/L (ref 3.5–5.1)
Sodium: 131 mmol/L — ABNORMAL LOW (ref 135–145)

## 2014-04-24 MED ORDER — SPIRONOLACTONE 12.5 MG HALF TABLET
12.5000 mg | ORAL_TABLET | Freq: Every day | ORAL | Status: DC
  Administered 2014-04-24 – 2014-05-05 (×12): 12.5 mg via ORAL
  Filled 2014-04-24 (×13): qty 1

## 2014-04-24 MED ORDER — POTASSIUM CHLORIDE CRYS ER 20 MEQ PO TBCR
20.0000 meq | EXTENDED_RELEASE_TABLET | Freq: Two times a day (BID) | ORAL | Status: DC
  Administered 2014-04-24 – 2014-04-27 (×7): 20 meq via ORAL
  Filled 2014-04-24 (×6): qty 1

## 2014-04-24 NOTE — Progress Notes (Signed)
Patient ID: [name], male   DOB: 03-17-1951, 64 y.o.   MRN: [id number] Advanced Heart Failure Rounding Note   Subjective:   [name] is 64 y.o. male with past medical history of severe aortic stenosis s/p tissue AV replacement (09/2012) atrial fibrillation s/p MAZE (09/2012) chronic combined systolic/diastolic HF, RV failure, HTN, PAD s/p L iliac stent in 2010 and aortobifemoral bypass graft, ETOH abuse, severe COPD with cor pulmonale, CAD s/p CABG with AV replacement and back pain from spinal stenosis. He also has a history of acute renal failure complicated by listeria bacteremia and Afib RVR 09/2013. Marland KitchenHe had TEE DC-CV but went back into afib with controlled rate on amiodarone and eliquis.   Admitted to [facility] 04/07/14 with left sided weakness CT of head showed right basal ganglial ICH with interventricular extension and 33mm of midline shift noted. Anticoagulants reversed. Plan to reconsider anticoagulants in 3-4 weeks if CT head is normal. Once stable, he was transferred to CIR. He was admitted to [facility] 04/13/14. Has been followed closely by PT/OT for ongoing therapry. Has ongoing L hemiparesis and hemisensory loss.   He got IV fluids due to elevated creatinine, now off. Renal function trending back down. Weight stable.  Diuretics held.   Feeling better. Denies dyspnea.   04/09/14 Creatinine1.35 BUN 18 04/21/14 Creatinine 2.0 BUN 119  04/22/14 Creatinine 1.97 K 3.1 Dig level 1.2  04/23/14 Creatinine 1.45 K 3.4 NA 136  04/24/13 creatinine 1.35  Objective:   Weight Range:  Vital Signs:   Temp:  [98.4 F (36.9 C)] 98.4 F (36.9 C) (01/01 0500) Pulse Rate:  [57-66] 66 (01/01 0500) Resp:  [18] 18 (01/01 0500) BP: (123-133)/(50-52) 123/50 mmHg (01/01 0500) SpO2:  [97 %-99 %] 97 % (01/01 0500) Weight:  [203 lb 7.8 oz (92.3 kg)] 203 lb 7.8 oz (92.3 kg) (01/01 0500) Last BM Date: 04/22/14  Weight change: Filed Weights   04/22/14 1700 04/23/14 0543 04/24/14 0500  Weight: 204 lb  5.9 oz (92.7 kg) 207 lb 14.3 oz (94.3 kg) 203 lb 7.8 oz (92.3 kg)    Intake/Output:   Intake/Output Summary (Last 24 hours) at 04/24/14 0955 Last data filed at 04/23/14 1800  Gross per 24 hour  Intake    360 ml  Output    550 ml  Net   -190 ml    Physical Exam: General: Chronically ill appearing. No resp difficulty. Lying in bed. HEENT: normal Neck: supple. JVP flat . Carotids 2+ bilat; no bruits. No lymphadenopathy or thryomegaly appreciated. Cor: PMI nondisplaced. Regular rate & rhythm. No rubs, gallops or murmurs. Lungs: clear on 2 liters  Abdomen: soft, nontender, distended. No hepatosplenomegaly. No bruits or masses. Good bowel sounds. Extremities: no cyanosis, clubbing, rash, edema. LUE LLE weakness noted.  Neuro: alert & orientedx3, cranial nerves grossly intact. moves all 4 extremities w/o difficulty. Affect pleasant   Labs: Basic Metabolic Panel:  Recent Labs Lab 04/21/14 1900 04/21/14 2240 04/22/14 0400 04/23/14 0605 04/24/14 0524  NA 127* 130* 130* 136 131*  K 2.5* 2.8* 3.1* 3.4* 3.9  CL 83* 86* 90* 100 100  CO2 28 31 29 26 24   GLUCOSE 190* 152* 162* 120* 157*  BUN 123* 118* 112* 60* 41*  CREATININE 2.28* 2.12* 1.97* 1.45* 1.35  CALCIUM 8.1* 8.2* 8.1* 8.2* 8.1*    Liver Function Tests:  Recent Labs Lab 04/22/14 0400  ALBUMIN 3.2*   No results for input(s): LIPASE, AMYLASE in the last 168 hours. No results for input(s): AMMONIA in  the last 168 hours.  CBC: No results for input(s): WBC, NEUTROABS, HGB, HCT, MCV, PLT in the last 168 hours.  Cardiac Enzymes: No results for input(s): CKTOTAL, CKMB, CKMBINDEX, TROPONINI in the last 168 hours.  BNP: BNP (last 3 results)  Recent Labs  09/19/13 1941 09/24/13 0400 01/14/14 1050  PROBNP 3530.0* 1792.0* 872.6*     Other results:  EKG:   Imaging: No results found.   Medications:     Scheduled Medications: . antiseptic oral rinse  7 mL Mouth Rinse BID  . atorvastatin  80 mg Oral  q1800  . budesonide-formoterol  2 puff Inhalation BID  . carvedilol  12.5 mg Oral BID WC  . diclofenac sodium  2 g Topical QID  . digoxin  0.125 mg Oral Q48H  . feeding supplement (ENSURE COMPLETE)  237 mL Oral BID BM  . feeding supplement (PRO-STAT SUGAR FREE 64)  30 mL Oral BID BM  . feeding supplement (RESOURCE BREEZE)  1 Container Oral TID BM  . mirtazapine  7.5 mg Oral QHS  . MUSCLE RUB   Topical TID WC  . pantoprazole  40 mg Oral Daily  . potassium chloride SA  20 mEq Oral BID  . spironolactone  12.5 mg Oral Daily  . tiZANidine  8 mg Oral BID    Infusions:    PRN Medications: acetaminophen, alum & mag hydroxide-simeth, bisacodyl, diphenhydrAMINE, guaiFENesin-dextromethorphan, prochlorperazine **OR** prochlorperazine **OR** prochlorperazine, traMADol, traZODone   Assessment/Plan   [name] is 64 year old with known biventricular HF and chronic A fib followed closely in the HF clinc. Admitted CIR after ICH requiring ongoing trehabilitation due deficits noted on the Left. The HF team was asked to consult by Dr Naaman Plummer for HF and worsening renal function.   1. ICH: off anticoagulants. Plan to repeat CT of head 3-4 weeks to reconsider anticoagulants.  2. Chronic Biventricular HF R>L NICM, EF 40-45%, TAPSE < 1.0 (09/2013).  Weight has dropped significantly over the last week (16 pounds down). Weights difficult to follow due to bed weights. Off IV fluid.  Volume status stable. Renal function continues to improve. Keep off torsemide for now.  Continue carvedilol 12.5 mg twice a day. Restart dig 0.125 mg today then every other day. Check dig level next Friday.  I think he can also go back on spironolactone 12.5 mg daily.  Will need daily weights. Strict I/Os. 3. Severe COPD with cor pulmonale: on chronic oxygen.  4 Chronic A fib s/p MAZE: failed DC-CV (09/2013). Today EKG appears to be NSR. Not on anticoagulants as above (ICH). Per Neuro will need repeat CT before considering  anticoagulants. However, given ICH on Eliquis (lower risk for ICH than warfarin), I am concerned that restarting anticoagulation will be high risk.  5. AKI: Suspect prerenal azotemia. Creatinine trending down. Holding torsemide 6. Hypokalemia: Resolved.  7. Hyponatremia: Suspect hypovolemic hyponatremia (use of metolazone - thiazide diuretic - also likely contributes). Continues to improve.     Will check on him again Monday. Call with questions over weekend.   Length of Stay: [address]  04/24/2014, 9:55 AM

## 2014-04-24 NOTE — Progress Notes (Signed)
64 y.o. right handed male history of HTN, COPD-oxygen dependent, A fib on Eliquis, s/p MAZE June 2014. He was independent prior to admission, lives alone and was using a walker due to back pain.  He was admitted on 04/07/2014 past  with left-sided weakness and mild slurred speech. CCT showed a right IC basal ganglia hemorrhage with a 4 mm right to left midline shift and BP 158/85 at admission. Eliquis was  reversed with Kcentra and BP monitored closely. Recommendations to repeat CCT in 3-4 weeks to help determine ability to resume Eliquis.  Repeat carotid dopplers done revealing 60- 79% L-ICA and 40- 59% R-ICA stenosis   Subjective/Complaints: No new problems. Appetite better. Able to sleep without any issues.  Review of Systems - mild pain  Objective: Vital Signs: Blood pressure 123/50, pulse 66, temperature 98.4 F (36.9 C), temperature source Oral, resp. rate 18, height _0  (1.778 m), weight 92.3 kg (203 lb 7.8 oz), SpO2 97 %. No results found. Results for orders placed or performed during the hospital encounter of 04/13/14 (from the past 72 hour(s))  Basic metabolic panel     Status: Abnormal   Collection Time: 04/21/14 10:33 AM  Result Value Ref Range   Sodium 126 (L) 135 - 145 mmol/L    Comment: Please note change in reference range. DELTA CHECK NOTED    Potassium 2.0 (LL) 3.5 - 5.1 mmol/L    Comment: CRITICAL RESULT CALLED TO, READ BACK BY AND VERIFIED WITH: Leonie Green RN 04/21/14 1157 COSTELLO B RESULTS VERIFIED VIA RECOLLECT Please note change in reference range. REPEATED TO VERIFY    Chloride 79 (L) 96 - 112 mEq/L   CO2 31 19 - 32 mmol/L   Glucose, Bld 188 (H) 70 - 99 mg/dL   BUN 119 (H) 6 - 23 mg/dL    Comment: RESULTS VERIFIED VIA RECOLLECT   Creatinine, Ser 2.46 (H) 0.50 - 1.35 mg/dL   Calcium 8.5 8.4 - 10.5 mg/dL   GFR calc non Af Amer 26 (L) >90 mL/min   GFR calc Af Amer 30 (L) >90 mL/min    Comment: (NOTE) The eGFR has been calculated using the CKD EPI  equation. This calculation has not been validated in all clinical situations. eGFR's persistently <90 mL/min signify possible Chronic Kidney Disease. CORRECTED ON 12/29 AT 1452: PREVIOUSLY REPORTED AS 30    Anion gap 16 (H) 5 - 15  Basic metabolic panel     Status: Abnormal   Collection Time: 04/21/14 12:30 PM  Result Value Ref Range   Sodium 129 (L) 135 - 145 mmol/L    Comment: Please note change in reference range.   Potassium 2.2 (LL) 3.5 - 5.1 mmol/L    Comment: Please note change in reference range. REPEATED TO VERIFY CRITICAL RESULT CALLED TO, READ BACK BY AND VERIFIED WITH: V.WASHINGTON,RN 04/21/14 1400 BY BSLADE    Chloride 80 (L) 96 - 112 mEq/L   CO2 27 19 - 32 mmol/L   Glucose, Bld 150 (H) 70 - 99 mg/dL   BUN 122 (H) 6 - 23 mg/dL   Creatinine, Ser 2.36 (H) 0.50 - 1.35 mg/dL    Comment: RESULTS VERIFIED VIA RECOLLECT   Calcium 8.6 8.4 - 10.5 mg/dL   GFR calc non Af Amer 27 (L) >90 mL/min   GFR calc Af Amer 32 (L) >90 mL/min    Comment: (NOTE) The eGFR has been calculated using the CKD EPI equation. This calculation has not been validated in all clinical situations. eGFR's  persistently <90 mL/min signify possible Chronic Kidney Disease.    Anion gap 22 (H) 5 - 15  Basic metabolic panel     Status: Abnormal   Collection Time: 04/21/14  7:00 PM  Result Value Ref Range   Sodium 127 (L) 135 - 145 mmol/L    Comment: Please note change in reference range.   Potassium 2.5 (LL) 3.5 - 5.1 mmol/L    Comment: Please note change in reference range. REPEATED TO VERIFY CRITICAL RESULT CALLED TO, READ BACK BY AND VERIFIED WITH: B BAKER,RN 1947 04/21/14 WBOND    Chloride 83 (L) 96 - 112 mEq/L   CO2 28 19 - 32 mmol/L   Glucose, Bld 190 (H) 70 - 99 mg/dL   BUN 123 (H) 6 - 23 mg/dL   Creatinine, Ser 2.28 (H) 0.50 - 1.35 mg/dL   Calcium 8.1 (L) 8.4 - 10.5 mg/dL   GFR calc non Af Amer 29 (L) >90 mL/min   GFR calc Af Amer 33 (L) >90 mL/min    Comment: (NOTE) The eGFR has been  calculated using the CKD EPI equation. This calculation has not been validated in all clinical situations. eGFR's persistently <90 mL/min signify possible Chronic Kidney Disease.    Anion gap 16 (H) 5 - 15  Basic metabolic panel     Status: Abnormal   Collection Time: 04/21/14 10:40 PM  Result Value Ref Range   Sodium 130 (L) 135 - 145 mmol/L    Comment: Please note change in reference range.   Potassium 2.8 (L) 3.5 - 5.1 mmol/L    Comment: Please note change in reference range.   Chloride 86 (L) 96 - 112 mEq/L   CO2 31 19 - 32 mmol/L   Glucose, Bld 152 (H) 70 - 99 mg/dL   BUN 118 (H) 6 - 23 mg/dL   Creatinine, Ser 2.12 (H) 0.50 - 1.35 mg/dL   Calcium 8.2 (L) 8.4 - 10.5 mg/dL   GFR calc non Af Amer 31 (L) >90 mL/min   GFR calc Af Amer 36 (L) >90 mL/min    Comment: (NOTE) The eGFR has been calculated using the CKD EPI equation. This calculation has not been validated in all clinical situations. eGFR's persistently <90 mL/min signify possible Chronic Kidney Disease.    Anion gap 13 5 - 15  Digoxin level     Status: None   Collection Time: 04/22/14  4:00 AM  Result Value Ref Range   Digoxin Level 1.2 0.8 - 2.0 ng/mL  Basic metabolic panel     Status: Abnormal   Collection Time: 04/22/14  4:00 AM  Result Value Ref Range   Sodium 130 (L) 135 - 145 mmol/L    Comment: Please note change in reference range.   Potassium 3.1 (L) 3.5 - 5.1 mmol/L    Comment: Please note change in reference range.   Chloride 90 (L) 96 - 112 mEq/L   CO2 29 19 - 32 mmol/L   Glucose, Bld 162 (H) 70 - 99 mg/dL   BUN 112 (H) 6 - 23 mg/dL   Creatinine, Ser 1.97 (H) 0.50 - 1.35 mg/dL   Calcium 8.1 (L) 8.4 - 10.5 mg/dL   GFR calc non Af Amer 34 (L) >90 mL/min   GFR calc Af Amer 40 (L) >90 mL/min    Comment: (NOTE) The eGFR has been calculated using the CKD EPI equation. This calculation has not been validated in all clinical situations. eGFR's persistently <90 mL/min signify possible Chronic  Kidney  Disease.    Anion gap 11 5 - 15  Albumin     Status: Abnormal   Collection Time: 04/22/14  4:00 AM  Result Value Ref Range   Albumin 3.2 (L) 3.5 - 5.2 g/dL  Basic metabolic panel     Status: Abnormal   Collection Time: 04/23/14  6:05 AM  Result Value Ref Range   Sodium 136 135 - 145 mmol/L    Comment: Please note change in reference range.   Potassium 3.4 (L) 3.5 - 5.1 mmol/L    Comment: Please note change in reference range.   Chloride 100 96 - 112 mEq/L   CO2 26 19 - 32 mmol/L   Glucose, Bld 120 (H) 70 - 99 mg/dL   BUN 60 (H) 6 - 23 mg/dL   Creatinine, Ser 1.45 (H) 0.50 - 1.35 mg/dL   Calcium 8.2 (L) 8.4 - 10.5 mg/dL   GFR calc non Af Amer 49 (L) >90 mL/min   GFR calc Af Amer 57 (L) >90 mL/min    Comment: (NOTE) The eGFR has been calculated using the CKD EPI equation. This calculation has not been validated in all clinical situations. eGFR's persistently <90 mL/min signify possible Chronic Kidney Disease.    Anion gap 10 5 - 15  Basic metabolic panel     Status: Abnormal   Collection Time: 04/24/14  5:24 AM  Result Value Ref Range   Sodium 131 (L) 135 - 145 mmol/L    Comment: Please note change in reference range.   Potassium 3.9 3.5 - 5.1 mmol/L    Comment: Please note change in reference range.   Chloride 100 96 - 112 mEq/L   CO2 24 19 - 32 mmol/L   Glucose, Bld 157 (H) 70 - 99 mg/dL   BUN 41 (H) 6 - 23 mg/dL   Creatinine, Ser 1.35 0.50 - 1.35 mg/dL   Calcium 8.1 (L) 8.4 - 10.5 mg/dL   GFR calc non Af Amer 54 (L) >90 mL/min   GFR calc Af Amer 63 (L) >90 mL/min    Comment: (NOTE) The eGFR has been calculated using the CKD EPI equation. This calculation has not been validated in all clinical situations. eGFR's persistently <90 mL/min signify possible Chronic Kidney Disease.    Anion gap 7 5 - 15     HEENT: normal and O2 [place] Cardio: RRR and murmur Resp: CTA B/L and unlabored, no rales or wheezes GI: BS positive and NT, ND Extremity:  No Edema Skin:    Bruise multiple eccymotic areas in BUE and BLE Neuro: Alert/Oriented, Cranial Nerve II-XII normal, Abnormal Sensory reduced on Left, Abnormal Motor 3/5 LUE, 3- Left HF, 4/5 L KE, 3- L ankle DF and Abnormal [facility] Ataxic/ dec [facility] Musc/Skel:  Other No swelling or pain with AROM Right knee. Reduced insight and awareness Gen NAD Psych: pleasant and appropriate   Assessment/Plan: 1. Functional deficits secondary to right BG/IC hemorrhage with left hemiparesis and hemisensory loss which require 3+ hours per day of interdisciplinary therapy in a comprehensive inpatient rehab setting. Physiatrist is providing close team supervision and 24 hour management of active medical problems listed below. Physiatrist and rehab team continue to assess barriers to discharge/monitor patient progress toward functional and medical goals.  FIM: FIM - Bathing Bathing Steps Patient Completed: Chest, Left Arm, Abdomen, Front perineal area, Right upper leg, Left upper leg, Right Arm Bathing: 3: Mod-Patient completes 5-7 [id number] parts or 50-74%  FIM - Upper Body Dressing/Undressing Upper body dressing/undressing steps  patient completed: Thread/unthread right sleeve of pullover shirt/dresss, Thread/unthread left sleeve of pullover shirt/dress, Put head through opening of pull over shirt/dress Upper body dressing/undressing: 0: Wears gown/pajamas-no public clothing FIM - Lower Body Dressing/Undressing Lower body dressing/undressing steps patient completed: Thread/unthread right pants leg Lower body dressing/undressing: 0: Wears Interior and spatial designer  FIM - Musician Devices: Grab bar or rail for support Toileting: 0: Activity did not occur  FIM - Radio producer Devices: Bedside commode, Grab bars Toilet Transfers: 0-Activity did not occur  FIM - Control and instrumentation engineer Devices: Bed rails, Orthosis (R knee hyperextension brace; L hinged  knee brace; L GivMohr sling) Bed/Chair Transfer: 1: Bed > Chair or W/C: Total A (helper does all/Pt. < 25%), 2: Chair or W/C > Bed: Max A (lift and lower assist)  FIM - Locomotion: Wheelchair Distance: 55 Locomotion: Wheelchair: 1: Total Assistance/staff pushes wheelchair (Pt<25%) FIM - Locomotion: Ambulation Locomotion: Ambulation Assistive Devices: Orthosis, Other (comment) (R Swedish knee cage; L hinged knee brace) Ambulation/Gait Assistance: 1: +2 Total assist, Other (comment) (+3A (3 musketeers and additional person for w/c follow and O2 management)) Locomotion: Ambulation: 0: Activity did not occur  Comprehension Comprehension Mode: Auditory Comprehension: 5-Follows basic conversation/direction: With no assist  Expression Expression Mode: Verbal Expression: 5-Expresses basic needs/ideas: With extra time/assistive device  Social Interaction Social Interaction Mode: Asleep Social Interaction: 4-Interacts appropriately 75 - 89% of the time - Needs redirection for appropriate language or to initiate interaction.  Problem Solving Problem Solving: 5-Solves basic 90% of the time/requires cueing < 10% of the time  Memory Memory: 5-Recognizes or recalls 90% of the time/requires cueing < 10% of the time  Medical Problem List and Plan: 1. Functional deficits secondary to right BG/IC hemorrhage with left hemiparesis and hemisensory loss 2.  DVT Prophylaxis/Anticoagulation: Continue SCDs. Add TEDs. Monitor for any signs of DVT. Check follow up CT head early Jan.   3. Pain Management/chronic back pain: Tylenol as needed for pain. Add voltaren gel. Continue Zanaflex 8 mg twice a day 4. Dysphagia. Dysphagia 3 nectar liquids. Adv per slp 5. Neuropsych: This patient is capable of making decisions on her own behalf. 6. Skin/Wound Care: Routine skin checks 7. Fluids/Electrolytes/Nutrition: ivf at 75/hr. Encourage po 8. Hypertension/CV. Monitor every 8 hours. Continue Coreg 12.5 mg twice a day,  Aldactone 25 mg daily, Demadex as directed, Zaroxolyn 2.5 mg Monday Wednesday Friday.   -continue to hold diuretics  -potassium  supplementation   -stopped IVF  -serial bmet, weights  -encouraging intake---appears to be picking up  -appreciate cards follow up 9. Atrial fibrillation/status post Maze procedure June 2014: Eliquis on hold secondary to hemorrhage. Continue Lanoxin 0.25 mg every other day. Monitor HR tid.   10. Hyperlipidemia. Lipitor 11. History of gouty arthritis: held colchicine due to diarrhea . 12. COPD: Respiratory status stable. Continue Symbicort twice daily. 13. H/o  Peripheral edema with weeping:   Low salt diet.     14. Constipation: ----improved   LOS (Days) 11 A FACE TO FACE EVALUATION WAS PERFORMED  Chancelor Hardrick T 04/24/2014, 7:47 AM

## 2014-04-25 ENCOUNTER — Inpatient Hospital Stay (HOSPITAL_COMMUNITY): Payer: BLUE CROSS/BLUE SHIELD | Admitting: *Deleted

## 2014-04-25 ENCOUNTER — Encounter (HOSPITAL_COMMUNITY): Payer: Self-pay

## 2014-04-25 ENCOUNTER — Inpatient Hospital Stay (HOSPITAL_COMMUNITY): Payer: BLUE CROSS/BLUE SHIELD | Admitting: Physical Therapy

## 2014-04-25 DIAGNOSIS — I618 Other nontraumatic intracerebral hemorrhage: Secondary | ICD-10-CM

## 2014-04-25 NOTE — Progress Notes (Signed)
Occupational Therapy Session Note  Patient Details  Name: [name] MRN: [id number] Date of Birth: 1950/11/16  Today's Date: 04/25/2014 OT Individual Time:  -   [phone]  (60 min)      Short Term Goals: Week 1:  OT Short Term Goal 1 (Week 1): Patient will perform UB/LB bathing in sit<>stand position with moderate assistance (1 person), using mechanical lift prn OT Short Term Goal 1 - Progress (Week 1): Partly met OT Short Term Goal 2 (Week 1): Patient will perform UB dressing with min assist while seated unsupported OT Short Term Goal 2 - Progress (Week 1): Progressing toward goal OT Short Term Goal 3 (Week 1): Patient will perform LB dressing with moderate assistance in sit<>stand position (1 person), using mechanical lift prn  OT Short Term Goal 3 - Progress (Week 1): Not met OT Short Term Goal 4 (Week 1): Patient will perform toilet transfer using BSC prn with moderate assistance (1 person), using mechanical lift prn OT Short Term Goal 4 - Progress (Week 1): Not met OT Short Term Goal 5 (Week 1): Patient will be educated on a strengthening HEP OT Short Term Goal 5 - Progress (Week 1): Not met Week 2:  OT Short Term Goal 1 (Week 2): Pt will complete bathing at sit <> stand level with max assist of 1 person OT Short Term Goal 2 (Week 2): Pt will complete UB dressing with min assist while seated unsupported OT Short Term Goal 3 (Week 2): Pt will complete LB dressing at sit <> stand level with max assist of 1 person OT Short Term Goal 4 (Week 2): Pt will complete toilet transfer to [facility] with mod assist of 1 person  Skilled Therapeutic Interventions/Progress Updates:    OT discussed energy conservation education with pt as he continued to require rest breaks throughout session.  Pt. Demonstrated breathing technique.  Reinforced abdominal contraction with inhalation and exhalation.  Pt. Bathed at sink using RUE and LUE as assist.  Refused to stand even with +2 to bathe peri area.   Practiced proprioception to LUE with isometric exercises.  Pt. Left in wc with all needs in reach.     Therapy Documentation Precautions:  Precautions Precautions: Fall Restrictions Weight Bearing Restrictions: No    Vital Signs: Therapy Vitals Oxygen Therapy SpO2: 96 % O2 Device: Nasal Cannula O2 Flow Rate (L/min): 1.5  L/min Pain: Pain Assessment Pain Assessment: 0-10 Pain Score: 2  Pain Type: Acute pain Pain Location: Shoulder Pain Orientation: Left Pain Descriptors / Indicators: Aching Pain Intervention(s): Medication (See eMAR)      See FIM for current functional status  Therapy/Group: Individual Therapy  Lisa Roca 04/25/2014, 10:44 AM

## 2014-04-25 NOTE — Progress Notes (Addendum)
Physical Therapy Session Note  Patient Details  Name: [name] MRN: [id number] Date of Birth: 04/04/1951  Today's Date: 04/25/2014 PT Individual Time: 0800-0900 and 1331-1431 PT Individual Time Calculation (min): 60 min and 60 min  Short Term Goals: Week 2:  PT Short Term Goal 1 (Week 2): Pt will perform supine > sit with mod A with HOB flat using bed rail. PT Short Term Goal 2 (Week 2): Pt will perform sit > supine with HOB flat and mod A. PT Short Term Goal 3 (Week 2): Pt will consistently transfer from bed<>w/c with mod A of single therapist. PT Short Term Goal 4 (Week 2): Pt will perform w/c mobility x75' with supervision and 25% cueing for technique.  Skilled Therapeutic Interventions/Progress Updates:    Treatment Session 1: 1:1. Pt received semi reclined in bed on 2L/min supplemental O2, [place]; agreeable to therapy. Noted pt's clothing wet due to condom catheter having fallen off. Therefore, assisted pt with brief change and hygiene, during which pt performed rolling with bilat rails with supervision to L side and min A to R side  RN made aware of incontinent episode. Pt performed supine > sit with HOB flat, no rails with min A. Seated EOB, donned Teds, shoes, hyperextension brace on R knee, and hinged L knee brace. Decreased supplemental O2 to 1 L/min, per MD order to wean O2 if SpO2 >90%. Pt performed squat pivot transfer from bed > w/c (to L side) with max A. Transported pt to gym, where pt performed static standing with total A of standing frame x8.5 consecutive minutes. Pt required verbal/demonstration cueing to increase pt attention to majority of weight bearing in LLE, as pt noted to lift/move R foot during standing. Pt with difficulty increasing RLE weightbearing. Standing trial ended secondary to pt fatigue. Pt denied any pain during/after standing trial. Post-standing, SpO2 98% and HR 74 while on 1 L/min O2. Session ended in pt room, where pt was left seated in w/c with all needs  within reach and on 1.5 L/min supplemental O2, [place] with SpO2 WNL.  Treatment Session 2: 2:1 Pt received seated in w/c; agreeable to therapy. Session focused on grading of transitional movement with focus on sit<>stand transfers. Donned R knee hyperextension brace and L GivMohr sling. Transported pt to hallway in w/c, where pt attempted sit>stand from w/c with R hallway hand rail without success. Transported pt to gym, where pt performed squat pivot transfers from w/c<>mat table with mod A to R side and max A to L side. Pt then performed blocked practice of sit<>stand transfers using Stedy with +2A; PT positioned at pt's R providing tactile cueing for anterior weight shift, increased RLE weightbearing; rehab tech positioned at L side providing manual support at LUE and lifting/lowering assist during sit<>stand. Additional cueing focused on attention to LUE, use of LE's, and slow, controlled transitional movements. Longest standing trial: 40 seconds. Mirror positioned anterior to pt for visual feedback of postural impairments (lateral trunk lean to L side). Despite improvement in grading/motor control with sit<>stand, pt exhibiting decreased frustration tolerance when pt perceived standing trials as unsuccessful. Pt on 2 L/min supplemental O2 during/after this session with SpO2 >95% throughout.  Therapy Documentation Precautions:  Precautions Precautions: Fall Restrictions Weight Bearing Restrictions: No Vital Signs: Oxygen Therapy SpO2: 96 % O2 Device: Nasal Cannula O2 Flow Rate (L/min): 1 L/min Pain: Pain Assessment Pain Score: 2    See FIM for current functional status  Therapy/Group: Individual Therapy  Hobble, Malva Cogan 04/25/2014, 12:57  PM  

## 2014-04-25 NOTE — Progress Notes (Signed)
64 y.o. right handed male history of HTN, COPD-oxygen dependent, A fib on Eliquis, s/p MAZE June 2014. He was independent prior to admission, lives alone and was using a walker due to back pain.  He was admitted on 04/07/2014 past  with left-sided weakness and mild slurred speech. CCT showed a right IC basal ganglia hemorrhage with a 4 mm right to left midline shift and BP 158/85 at admission. Eliquis was  reversed with Kcentra and BP monitored closely. Recommendations to repeat CCT in 3-4 weeks to help determine ability to resume Eliquis.  Repeat carotid dopplers done revealing 60- 79% L-ICA and 40- 59% R-ICA stenosis   Subjective/Complaints: In good spirits. Denies pain. Appetite better. No new sob,cough Review of Systems - mild pain  Objective: Vital Signs: Blood pressure 137/42, pulse 58, temperature 98.6 F (37 C), temperature source Oral, resp. rate 17, height '5\' 10"'  (1.778 m), weight 93.8 kg (206 lb 12.7 oz), SpO2 99 %. No results found. Results for orders placed or performed during the hospital encounter of 04/13/14 (from the past 72 hour(s))  Basic metabolic panel     Status: Abnormal   Collection Time: 04/23/14  6:05 AM  Result Value Ref Range   Sodium 136 135 - 145 mmol/L    Comment: Please note change in reference range.   Potassium 3.4 (L) 3.5 - 5.1 mmol/L    Comment: Please note change in reference range.   Chloride 100 96 - 112 mEq/L   CO2 26 19 - 32 mmol/L   Glucose, Bld 120 (H) 70 - 99 mg/dL   BUN 60 (H) 6 - 23 mg/dL   Creatinine, Ser 1.45 (H) 0.50 - 1.35 mg/dL   Calcium 8.2 (L) 8.4 - 10.5 mg/dL   GFR calc non Af Amer 49 (L) >90 mL/min   GFR calc Af Amer 57 (L) >90 mL/min    Comment: (NOTE) The eGFR has been calculated using the CKD EPI equation. This calculation has not been validated in all clinical situations. eGFR's persistently <90 mL/min signify possible Chronic Kidney Disease.    Anion gap 10 5 - 15  Basic metabolic panel     Status: Abnormal   Collection  Time: 04/24/14  5:24 AM  Result Value Ref Range   Sodium 131 (L) 135 - 145 mmol/L    Comment: Please note change in reference range.   Potassium 3.9 3.5 - 5.1 mmol/L    Comment: Please note change in reference range.   Chloride 100 96 - 112 mEq/L   CO2 24 19 - 32 mmol/L   Glucose, Bld 157 (H) 70 - 99 mg/dL   BUN 41 (H) 6 - 23 mg/dL   Creatinine, Ser 1.35 0.50 - 1.35 mg/dL   Calcium 8.1 (L) 8.4 - 10.5 mg/dL   GFR calc non Af Amer 54 (L) >90 mL/min   GFR calc Af Amer 63 (L) >90 mL/min    Comment: (NOTE) The eGFR has been calculated using the CKD EPI equation. This calculation has not been validated in all clinical situations. eGFR's persistently <90 mL/min signify possible Chronic Kidney Disease.    Anion gap 7 5 - 15     HEENT: normal and O2 [place] Cardio: RRR and murmur Resp: CTA B/L and unlabored, no rales or wheezes GI: BS positive and NT, ND Extremity:  No Edema Skin:   Bruise multiple eccymotic areas in BUE and BLE Neuro: Alert/Oriented, Cranial Nerve II-XII normal, Abnormal Sensory reduced on Left, Abnormal Motor 3/5 LUE, 3- Left  HF, 4/5 L KE, 3- L ankle DF and Abnormal [facility] Ataxic/ dec [facility] Musc/Skel:    No swelling or pain with AROM Right knee.   Gen NAD Psych: pleasant and appropriate. Reduced insight   Assessment/Plan: 1. Functional deficits secondary to right BG/IC hemorrhage with left hemiparesis and hemisensory loss which require 3+ hours per day of interdisciplinary therapy in a comprehensive inpatient rehab setting. Physiatrist is providing close team supervision and 24 hour management of active medical problems listed below. Physiatrist and rehab team continue to assess barriers to discharge/monitor patient progress toward functional and medical goals.  FIM: FIM - Bathing Bathing Steps Patient Completed: Chest, Left Arm, Abdomen, Front perineal area, Right upper leg, Left upper leg, Right Arm Bathing: 3: Mod-Patient completes 5-7 62f10 parts or 50-74%  FIM -  Upper Body Dressing/Undressing Upper body dressing/undressing steps patient completed: Thread/unthread right sleeve of pullover shirt/dresss, Thread/unthread left sleeve of pullover shirt/dress, Put head through opening of pull over shirt/dress Upper body dressing/undressing: 0: Wears gown/pajamas-no public clothing FIM - Lower Body Dressing/Undressing Lower body dressing/undressing steps patient completed: Thread/unthread right pants leg Lower body dressing/undressing: 0: Wears gInterior and spatial designer FIM - TMusicianDevices: Grab bar or rail for support Toileting: 0: Activity did not occur  FIM - TRadio producerDevices: Bedside commode, Grab bars Toilet Transfers: 0-Activity did not occur  FIM - BControl and instrumentation engineerDevices: Bed rails, Orthosis (R knee hyperextension brace; L hinged knee brace; L GivMohr sling) Bed/Chair Transfer: 1: Bed > Chair or W/C: Total A (helper does all/Pt. < 25%), 2: Chair or W/C > Bed: Max A (lift and lower assist)  FIM - Locomotion: Wheelchair Distance: 55 Locomotion: Wheelchair: 1: Total Assistance/staff pushes wheelchair (Pt<25%) FIM - Locomotion: Ambulation Locomotion: Ambulation Assistive Devices: Orthosis, Other (comment) (R Swedish knee cage; L hinged knee brace) Ambulation/Gait Assistance: 1: +2 Total assist, Other (comment) (+3A (3 musketeers and additional person for w/c follow and O2 management)) Locomotion: Ambulation: 0: Activity did not occur  Comprehension Comprehension Mode: Auditory Comprehension: 5-Follows basic conversation/direction: With no assist  Expression Expression Mode: Verbal Expression: 5-Expresses basic needs/ideas: With extra time/assistive device  Social Interaction Social Interaction Mode: Asleep Social Interaction: 4-Interacts appropriately 75 - 89% of the time - Needs redirection for appropriate language or to initiate  interaction.  Problem Solving Problem Solving: 5-Solves basic 90% of the time/requires cueing < 10% of the time  Memory Memory: 5-Recognizes or recalls 90% of the time/requires cueing < 10% of the time  Medical Problem List and Plan: 1. Functional deficits secondary to right BG/IC hemorrhage with left hemiparesis and hemisensory loss 2.  DVT Prophylaxis/Anticoagulation: Continue SCDs. Add TEDs. Monitor for any signs of DVT. Check follow up CT head early Jan.   3. Pain Management/chronic back pain: Tylenol as needed for pain. Add voltaren gel. Continue Zanaflex 8 mg twice a day 4. Dysphagia. Dysphagia 3 nectar liquids. Adv per slp 5. Neuropsych: This patient is capable of making decisions on her own behalf. 6. Skin/Wound Care: Routine skin checks 7. Fluids/Electrolytes/Nutrition: ivf at 75/hr. Encourage po 8. Hypertension/CV. Monitor every 8 hours. Continue Coreg 12.5 mg twice a day, Aldactone 25 mg daily, Demadex as directed, Zaroxolyn 2.5 mg Monday Wednesday Friday.   -continue to hold diuretics  -potassium  supplementation   -stopped IVF  -serial bmet, weights  -po intake much improved  -appreciate cards follow up 9. Atrial fibrillation/status post Maze procedure June 2014: Eliquis on hold secondary to hemorrhage.  Continue Lanoxin 0.25 mg every other day. Monitor HR tid.   10. Hyperlipidemia. Lipitor 11. History of gouty arthritis: held colchicine due to diarrhea . 12. COPD: Respiratory status stable. Continue Symbicort twice daily. 13. H/o  Peripheral edema with weeping:   Low salt diet.     14. Constipation: ----improved   LOS (Days) 12 A FACE TO FACE EVALUATION WAS PERFORMED  Anyely Cunning T 04/25/2014, 8:01 AM

## 2014-04-26 ENCOUNTER — Inpatient Hospital Stay (HOSPITAL_COMMUNITY): Payer: BLUE CROSS/BLUE SHIELD | Admitting: *Deleted

## 2014-04-26 LAB — BASIC METABOLIC PANEL
ANION GAP: 4 — AB (ref 5–15)
BUN: 34 mg/dL — ABNORMAL HIGH (ref 6–23)
CHLORIDE: 99 meq/L (ref 96–112)
CO2: 29 mmol/L (ref 19–32)
CREATININE: 1.31 mg/dL (ref 0.50–1.35)
Calcium: 8.2 mg/dL — ABNORMAL LOW (ref 8.4–10.5)
GFR calc non Af Amer: 56 mL/min — ABNORMAL LOW (ref >90)
GFR, EST AFRICAN AMERICAN: 65 mL/min — AB (ref >90)
GLUCOSE: 131 mg/dL — AB (ref 70–99)
Potassium: 4.6 mmol/L (ref 3.5–5.1)
SODIUM: 132 mmol/L — AB (ref 135–145)

## 2014-04-26 MED ORDER — SENNOSIDES-DOCUSATE SODIUM 8.6-50 MG PO TABS
2.0000 | ORAL_TABLET | Freq: Every day | ORAL | Status: DC
  Administered 2014-04-26 – 2014-05-02 (×7): 2 via ORAL
  Filled 2014-04-26 (×12): qty 2

## 2014-04-26 MED ORDER — SENNOSIDES-DOCUSATE SODIUM 8.6-50 MG PO TABS
2.0000 | ORAL_TABLET | Freq: Once | ORAL | Status: AC
  Administered 2014-04-26: 2 via ORAL
  Filled 2014-04-26: qty 2

## 2014-04-26 NOTE — Progress Notes (Signed)
Occupational Therapy Session Note  Patient Details  Name: [name] MRN: [id number] Date of Birth: May 06, 1950  Today's Date: 04/26/2014 OT Individual Time:  -  1530-1630  (60 min)      Short Term Goals: Week 1:  OT Short Term Goal 1 (Week 1): Patient will perform UB/LB bathing in sit<>stand position with moderate assistance (1 person), using mechanical lift prn OT Short Term Goal 1 - Progress (Week 1): Partly met OT Short Term Goal 2 (Week 1): Patient will perform UB dressing with min assist while seated unsupported OT Short Term Goal 2 - Progress (Week 1): Progressing toward goal OT Short Term Goal 3 (Week 1): Patient will perform LB dressing with moderate assistance in sit<>stand position (1 person), using mechanical lift prn  OT Short Term Goal 3 - Progress (Week 1): Not met OT Short Term Goal 4 (Week 1): Patient will perform toilet transfer using BSC prn with moderate assistance (1 person), using mechanical lift prn OT Short Term Goal 4 - Progress (Week 1): Not met OT Short Term Goal 5 (Week 1): Patient will be educated on a strengthening HEP OT Short Term Goal 5 - Progress (Week 1): Not met Week 2:  OT Short Term Goal 1 (Week 2): Pt will complete bathing at sit <> stand level with max assist of 1 person OT Short Term Goal 2 (Week 2): Pt will complete UB dressing with min assist while seated unsupported OT Short Term Goal 3 (Week 2): Pt will complete LB dressing at sit <> stand level with max assist of 1 person OT Short Term Goal 4 (Week 2): Pt will complete toilet transfer to [facility] with mod assist of 1 person Week 3:     Skilled Therapeutic Interventions/Progress Updates:    Addressed LUE NMRE with focus on stabilization and weight bearing activities, transfers.  Pt. Lying in bed upon OT arrival  Went from supine to EOB with rolling and max assist with segmental movements.  Transferred >wc>mat with max assist.  Pt uses extension as the power source for movement from sit to half  stand.  Provided max assist with donning knee braces and educated pt on being able to tell caregivers the correct donning application.  Did sitting balance on mat with proprioceptive feedback to LUE in WB position.  Pt used visual feedback for LUE control.  He was able to maintain with minimal assist for 10 minutes.  Utilized stool for last stabilization activity.  Pt. Taken back to room and left in room with all needs in reach.     Therapy Documentation Precautions:  Precautions Precautions: Fall Restrictions Weight Bearing Restrictions: No    Vital Signs: Therapy Vitals Temp: 97.7 F (36.5 C) Temp Source: Oral Pulse Rate: (!) 57 Resp: 18 BP: (!) 122/43 mmHg Patient Position (if appropriate): Lying Oxygen Therapy SpO2: 99 % O2 Device: Nasal Cannula O2 Flow Rate (L/min): 2 L/min Pain: Pain Assessment Pain Assessment: 0-10 Pain Score: 5  Pain Type: Acute pain Pain Location: Shoulder Pain Orientation: Left Pain Descriptors / Indicators: Aching Pain Onset: Gradual Pain Intervention(s): Medication (See eMAR)        See FIM for current functional status  Therapy/Group: Individual Therapy  Lisa Roca 04/26/2014, 5:50 PM

## 2014-04-26 NOTE — Plan of Care (Signed)
Problem: RH BOWEL ELIMINATION Goal: RH STG MANAGE BOWEL WITH ASSISTANCE STG Manage Bowel with mod Assistance.  Outcome: Not Progressing Pt is on senna bid.Will continue to monitor.

## 2014-04-26 NOTE — Progress Notes (Signed)
64 y.o. right handed male history of HTN, COPD-oxygen dependent, A fib on Eliquis, s/p MAZE June 2014. He was independent prior to admission, lives alone and was using a walker due to back pain.  He was admitted on 04/07/2014 past  with left-sided weakness and mild slurred speech. CCT showed a right IC basal ganglia hemorrhage with a 4 mm right to left midline shift and BP 158/85 at admission. Eliquis was  reversed with Kcentra and BP monitored closely. Recommendations to repeat CCT in 3-4 weeks to help determine ability to resume Eliquis.  Repeat carotid dopplers done revealing 60- 79% L-ICA and 40- 59% R-ICA stenosis   Subjective/Complaints: Slept well. No new sob.  Review of Systems - mild pain  Objective: Vital Signs: Blood pressure 130/52, pulse 60, temperature 98.1 F (36.7 C), temperature source Oral, resp. rate 22, height _0  (1.778 m), weight 94.3 kg (207 lb 14.3 oz), SpO2 97 %. No results found. Results for orders placed or performed during the hospital encounter of 04/13/14 (from the past 72 hour(s))  Basic metabolic panel     Status: Abnormal   Collection Time: 04/24/14  5:24 AM  Result Value Ref Range   Sodium 131 (L) 135 - 145 mmol/L    Comment: Please note change in reference range.   Potassium 3.9 3.5 - 5.1 mmol/L    Comment: Please note change in reference range.   Chloride 100 96 - 112 mEq/L   CO2 24 19 - 32 mmol/L   Glucose, Bld 157 (H) 70 - 99 mg/dL   BUN 41 (H) 6 - 23 mg/dL   Creatinine, Ser 1.35 0.50 - 1.35 mg/dL   Calcium 8.1 (L) 8.4 - 10.5 mg/dL   GFR calc non Af Amer 54 (L) >90 mL/min   GFR calc Af Amer 63 (L) >90 mL/min    Comment: (NOTE) The eGFR has been calculated using the CKD EPI equation. This calculation has not been validated in all clinical situations. eGFR's persistently <90 mL/min signify possible Chronic Kidney Disease.    Anion gap 7 5 - 15     HEENT: normal and O2 [place] Cardio: RRR and murmur Resp: CTA B/L and unlabored, no rales or  wheezes GI: BS positive and NT, ND Extremity:  No Edema Skin:   Bruise multiple eccymotic areas in BUE and BLE Neuro: Alert/Oriented, Cranial Nerve II-XII normal, Abnormal Sensory reduced on Left, Abnormal Motor 3/5 LUE, 3- Left HF, 4/5 L KE, 3- L ankle DF and Abnormal [facility] Ataxic/ dec [facility] Musc/Skel:    No swelling or pain with AROM Right knee.   Gen NAD Psych: pleasant and appropriate. Reduced insight   Assessment/Plan: 1. Functional deficits secondary to right BG/IC hemorrhage with left hemiparesis and hemisensory loss which require 3+ hours per day of interdisciplinary therapy in a comprehensive inpatient rehab setting. Physiatrist is providing close team supervision and 24 hour management of active medical problems listed below. Physiatrist and rehab team continue to assess barriers to discharge/monitor patient progress toward functional and medical goals.  FIM: FIM - Bathing Bathing Steps Patient Completed: Chest, Left Arm, Abdomen, Front perineal area, Right upper leg, Left upper leg, Right Arm Bathing: 3: Mod-Patient completes 5-7 70f10 parts or 50-74%  FIM - Upper Body Dressing/Undressing Upper body dressing/undressing steps patient completed: Thread/unthread right sleeve of pullover shirt/dresss, Thread/unthread left sleeve of pullover shirt/dress, Put head through opening of pull over shirt/dress Upper body dressing/undressing: 5: Set-up assist to: Obtain clothing/put away FIM - Lower Body Dressing/Undressing  Lower body dressing/undressing steps patient completed: Thread/unthread right pants leg Lower body dressing/undressing: 0: Wears gown/pajamas-no public clothing  FIM - Musician Devices: Grab bar or rail for support Toileting: 0: Activity did not occur  FIM - Radio producer Devices: Bedside commode, Grab bars Toilet Transfers: 0-Activity did not occur  FIM - Control and instrumentation engineer Devices:  Orthosis, Arm rests (R knee hyperextension brace, L hinged knee brace, L GivMohr sling) Bed/Chair Transfer: 2: Chair or W/C > Bed: Max A (lift and lower assist), 2: Bed > Chair or W/C: Max A (lift and lower assist), 4: Supine > Sit: Min A (steadying Pt. > 75%/lift 1 leg)  FIM - Locomotion: Wheelchair Distance: 55 Locomotion: Wheelchair: 1: Total Assistance/staff pushes wheelchair (Pt<25%) FIM - Locomotion: Ambulation Locomotion: Ambulation Assistive Devices: Orthosis, Other (comment) (R Swedish knee cage; L hinged knee brace) Ambulation/Gait Assistance: 1: +2 Total assist, Other (comment) (+3A (3 musketeers and additional person for w/c follow and O2 management)) Locomotion: Ambulation: 0: Activity did not occur  Comprehension Comprehension Mode: Auditory Comprehension: 5-Follows basic conversation/direction: With extra time/assistive device  Expression Expression Mode: Verbal Expression: 5-Expresses basic needs/ideas: With no assist  Social Interaction Social Interaction Mode: Asleep Social Interaction: 5-Interacts appropriately 90% of the time - Needs monitoring or encouragement for participation or interaction.  Problem Solving Problem Solving: 5-Solves basic 90% of the time/requires cueing < 10% of the time  Memory Memory: 5-Recognizes or recalls 90% of the time/requires cueing < 10% of the time  Medical Problem List and Plan: 1. Functional deficits secondary to right BG/IC hemorrhage with left hemiparesis and hemisensory loss 2.  DVT Prophylaxis/Anticoagulation: Continue SCDs. Add TEDs. Monitor for any signs of DVT. Check follow up CT head early Jan.   3. Pain Management/chronic back pain: Tylenol as needed for pain. Add voltaren gel. Continue Zanaflex 8 mg twice a day 4. Dysphagia. Dysphagia 3 nectar liquids. Adv per slp 5. Neuropsych: This patient is capable of making decisions on her own behalf. 6. Skin/Wound Care: Routine skin checks 7. Fluids/Electrolytes/Nutrition:  improved intake. Off ivf. bmet tomorrow 8. Hypertension/CV. Monitor every 8 hours. Continue Coreg 12.5 mg twice a day, Aldactone 25 mg daily, Demadex as directed, Zaroxolyn 2.5 mg Monday Wednesday Friday.   -continue to hold diuretics except for aldactone  -potassium  supplementation   -stopped IVF  -daily weights, weight climbing again. Does not appear fluid overloaded   -po intake much improved  -appreciate cards follow up  -follow up bmet in am 9. Atrial fibrillation/status post Maze procedure June 2014: Eliquis on hold secondary to hemorrhage. Continue Lanoxin 0.25 mg every other day. Monitor HR tid.   10. Hyperlipidemia. Lipitor 11. History of gouty arthritis: held colchicine due to diarrhea . 12. COPD: Respiratory status stable. Continue Symbicort twice daily. 13. H/o  Peripheral edema with weeping:   Low salt diet.     14. Constipation: ----improved   LOS (Days) 13 A FACE TO FACE EVALUATION WAS PERFORMED  SWARTZ,ZACHARY T 04/26/2014, 8:03 AM

## 2014-04-26 NOTE — Plan of Care (Signed)
Problem: RH BOWEL ELIMINATION Goal: RH STG MANAGE BOWEL WITH ASSISTANCE STG Manage Bowel with mod Assistance.  Outcome: Not Progressing LBM 12/30. Senna restarted today

## 2014-04-27 ENCOUNTER — Inpatient Hospital Stay (HOSPITAL_COMMUNITY): Payer: BLUE CROSS/BLUE SHIELD | Admitting: Physical Therapy

## 2014-04-27 ENCOUNTER — Inpatient Hospital Stay (HOSPITAL_COMMUNITY): Payer: BLUE CROSS/BLUE SHIELD | Admitting: *Deleted

## 2014-04-27 ENCOUNTER — Inpatient Hospital Stay (HOSPITAL_COMMUNITY): Payer: Self-pay | Admitting: Speech Pathology

## 2014-04-27 ENCOUNTER — Inpatient Hospital Stay (HOSPITAL_COMMUNITY): Payer: Self-pay | Admitting: Physical Therapy

## 2014-04-27 ENCOUNTER — Encounter (HOSPITAL_COMMUNITY): Payer: Self-pay

## 2014-04-27 DIAGNOSIS — I5022 Chronic systolic (congestive) heart failure: Secondary | ICD-10-CM

## 2014-04-27 DIAGNOSIS — G8190 Hemiplegia, unspecified affecting unspecified side: Secondary | ICD-10-CM

## 2014-04-27 DIAGNOSIS — I610 Nontraumatic intracerebral hemorrhage in hemisphere, subcortical: Secondary | ICD-10-CM

## 2014-04-27 DIAGNOSIS — I69398 Other sequelae of cerebral infarction: Secondary | ICD-10-CM

## 2014-04-27 DIAGNOSIS — I480 Paroxysmal atrial fibrillation: Secondary | ICD-10-CM

## 2014-04-27 DIAGNOSIS — R208 Other disturbances of skin sensation: Secondary | ICD-10-CM

## 2014-04-27 DIAGNOSIS — N179 Acute kidney failure, unspecified: Secondary | ICD-10-CM

## 2014-04-27 LAB — BASIC METABOLIC PANEL
Anion gap: 6 (ref 5–15)
BUN: 32 mg/dL — AB (ref 6–23)
CO2: 27 mmol/L (ref 19–32)
Calcium: 8.6 mg/dL (ref 8.4–10.5)
Chloride: 102 mEq/L (ref 96–112)
Creatinine, Ser: 1.4 mg/dL — ABNORMAL HIGH (ref 0.50–1.35)
GFR calc Af Amer: 60 mL/min — ABNORMAL LOW (ref >90)
GFR, EST NON AFRICAN AMERICAN: 52 mL/min — AB (ref >90)
GLUCOSE: 149 mg/dL — AB (ref 70–99)
Potassium: 5 mmol/L (ref 3.5–5.1)
SODIUM: 135 mmol/L (ref 135–145)

## 2014-04-27 MED ORDER — ENSURE COMPLETE PO LIQD
237.0000 mL | Freq: Every day | ORAL | Status: DC
  Administered 2014-05-03: 237 mL via ORAL

## 2014-04-27 MED ORDER — TORSEMIDE 20 MG PO TABS
20.0000 mg | ORAL_TABLET | Freq: Every day | ORAL | Status: DC | PRN
  Filled 2014-04-27: qty 1

## 2014-04-27 MED ORDER — PRO-STAT SUGAR FREE PO LIQD
30.0000 mL | Freq: Every day | ORAL | Status: DC
  Administered 2014-04-28 – 2014-05-05 (×8): 30 mL via ORAL
  Filled 2014-04-27 (×9): qty 30

## 2014-04-27 MED ORDER — POTASSIUM CHLORIDE CRYS ER 20 MEQ PO TBCR: 20.0000 meq | EXTENDED_RELEASE_TABLET | Freq: Every day | ORAL | Status: DC

## 2014-04-27 NOTE — Progress Notes (Signed)
Speech Language Pathology Weekly Progress and Session Note  Patient Details  Name: [name] MRN: [id number] Date of Birth: Aug 29, 1950  Beginning of progress report period: April 20, 2014 End of progress report period: April 27, 2014  Today's Date: 04/27/2014 SLP Individual Time: 1400-1500 SLP Individual Time Calculation (min): 60 min  Short Term Goals: Week 2: SLP Short Term Goal 1 (Week 2): Pt will demonstrate improved mastication for toleration of regular textures with Mod I  SLP Short Term Goal 1 - Progress (Week 2): Other (comment) (not addressed this reporting period ) SLP Short Term Goal 2 (Week 2): Pt will tolerate dys 3 solids and thin liquids with no overt s/s of aspiration and Mod I use of swallowing precautions.  SLP Short Term Goal 2 - Progress (Week 2): Met SLP Short Term Goal 3 (Week 2): Pt will complete oral motor exercises targeting left sided weakness to improve mastication and manipulation of regular solid boluses with supervision.  SLP Short Term Goal 3 - Progress (Week 2): Other (comment) SLP Short Term Goal 4 (Week 2): Pt will improve recall of daily information via use of compensatory strategies over 80% of observable opportunities with supervision cues.  SLP Short Term Goal 4 - Progress (Week 2): Met SLP Short Term Goal 5 (Week 2): Pt will improve functional problem solving skills (i.e. planning, thought organization, and error awareness) during tasks over 80% of observable opportunities with supervision cues.  SLP Short Term Goal 5 - Progress (Week 2): Met    New Short Term Goals: Week 3: SLP Short Term Goal 1 (Week 3): Pt will demonstrate improved mastication for toleration of regular textures with Mod I  SLP Short Term Goal 2 (Week 3): Pt will complete oral motor exercises targeting left sided weakness to improve mastication and manipulation of regular solid boluses with supervision.  SLP Short Term Goal 3 (Week 3): Pt will improve recall of  semi-complex information via use of compensatory strategies over 80% of observable opportunities with supervision cues.  SLP Short Term Goal 4 (Week 3): Pt will improve functional problem solving skills (i.e. planning, thought organization, and error awareness) during complex tasks over 80% of observable opportunities with supervision cues.   Weekly Progress Updates:  Pt made functional gains this reporting period and has met 3 out of 5 short term goals.  Pt currently requires overall min assist-supervision for cognitive tasks and is utilizing his recommended swallowing precautions with supervision-mod I cues to minimize overt s/s of aspiration with dys 3 solids and thin liquids.  Per pt and RN, pt has been demonstrating improved carryover of use of swallowing precautions over the last week and would benefit from decreasing supervision during meals from full to intermittent/set up.  Pt and family education is ongoing.  Pt would continue to benefit from skilled ST while inpatient in order to maximize functional independence and reduce burden of care prior to discharge.  Anticipate that pt will continue to require 24/7 supervision at discharge and potential ST follow for continued cognitive remediation/compensation at next level of care.     Intensity: Minumum of 1-2 x/day, 30 to 90 minutes Frequency: 5 out of 7 days Duration/Length of Stay: 14-21 days  Treatment/Interventions: English as a second language teacher;Dysphagia/aspiration precaution training;Functional tasks;Patient/family education;Oral motor exercises;Internal/external aids;Environmental controls   Daily Session  Skilled Therapeutic Interventions: Pt was seen for skilled ST targeting cognitive goals.  Upon arrival, pt was seated upright in bed, awake, alert, and agreeable to participate in ST.  SLP facilitated the session  with continued practice of executive function skills for medication management.  Pt utilized the previously established compensatory aid  of a written medication list to complete pill box activity from last therapy session.  Pt loaded a pill box with his currently scheduled medications with min assist faded to supervision cues for 100% accuracy.  SLP encouraged pt to check behind himself after completing the activity to facilitate improved self monitoring and correction of errors.  Additonally, SLP facilitated the session with a new learning activity targeting planning, organization, and mental flexibility.  Pt completed the abovementioned task with overall min assist cues.  Goals updated on this date to reflect current progress and plan of care.          FIM:  Comprehension Comprehension Mode: Auditory Comprehension: 5-Follows basic conversation/direction: With no assist Expression Expression Mode: Verbal Expression: 5-Expresses basic needs/ideas: With no assist Social Interaction Social Interaction: 5-Interacts appropriately 90% of the time - Needs monitoring or encouragement for participation or interaction. Problem Solving Problem Solving: 4-Solves basic 75 - 89% of the time/requires cueing 10 - 24% of the time Memory Memory: 5-Recognizes or recalls 90% of the time/requires cueing < 10% of the time FIM - Eating Eating Activity: 6: Swallowing techniques: self-managed  Pain Pain Assessment Pain Assessment: No/denies pain  Therapy/Group: Individual Therapy  Nesreen Albano, Selinda Orion 04/27/2014, 4:08 PM

## 2014-04-27 NOTE — Progress Notes (Signed)
Day 3 of 3- Calorie Count Note  72 hour calorie count ordered.  Diet: Dysphagia 3 with thin liquids Supplements:  -Resource Breeze po TID, each supplement provides 250 kcal and 9 grams of protein. -Ensure Complete po BID, each supplement provides 350 kcal and 13 grams of protein. - 30 ml Prostat po BID, each supplement provides 100 kcal and 15 grams of protein.  Breakfast: 318 kcal, 10 grams of protein Lunch: 499 kcal, 27 grams of protein Dinner: 498 kcal, 28 grams of protein Supplements: 950 kcal, 46 grams of protein  Total intake: 2265 kcal (100% of estimated needs)  111 grams of protein (100% of estimated needs)  Per RN, meal completion has been improving. Pt has also been drinking his oral supplements and enjoys them. Will continue with current orders. Pt was encouraged to eat his food at meals and to take his supplements.   Nutrition Dx:  Inadequate oral intake related to decreased appetite as evidenced by varied meal completion of 30-100%, improved  Goal:  Pt to meet >/= 90% of their estimated nutrition needs; met  Intervention:   Discontinue Calorie Count.  Continue Resource Breeze po TID, each supplement provides 250 kcal and 9 grams of protein.  Decrease Ensure Complete po once daily, each supplement provides 350 kcal and 13 grams of protein.  Decrease 30 ml Prostat po once daily, each supplement provides 100 kcal and 15 grams of protein.  Encourage adequate PO intake.   Kallie Locks, MS, RD, LDN Pager # [phone] After hours/ weekend pager # [phone]

## 2014-04-27 NOTE — Progress Notes (Signed)
Patient ID: [name], male   DOB: April 09, 1951, 64 y.o.   MRN: [id number] Advanced Heart Failure Rounding Note   Subjective:   [name] is 64 y.o. male with past medical history of severe aortic stenosis s/p tissue AV replacement (09/2012) atrial fibrillation s/p MAZE (09/2012) chronic combined systolic/diastolic HF, RV failure, HTN, PAD s/p L iliac stent in 2010 and aortobifemoral bypass graft, ETOH abuse, severe COPD with cor pulmonale, CAD s/p CABG with AV replacement and back pain from spinal stenosis. He also has a history of acute renal failure complicated by listeria bacteremia and Afib RVR 09/2013. Marland KitchenHe had TEE DC-CV but went back into afib with controlled rate on amiodarone and eliquis.   Admitted to [facility] 04/07/14 with left sided weakness CT of head showed right basal ganglial ICH with interventricular extension and 73mm of midline shift noted. Anticoagulants reversed. Plan to reconsider anticoagulants in 3-4 weeks if CT head is normal. Once stable, he was transferred to CIR. He was admitted to [facility] 04/13/14. Has been followed closely by PT/OT for ongoing therapry. Has ongoing L hemiparesis and hemisensory loss.   He got IV fluids due to elevated creatinine, now off. Renal function trending back down. Weight stable.  Diuretics have been held.   Denies SOB.   04/09/14 Creatinine1.35 BUN 18 04/21/14 Creatinine 2.0 BUN 119  04/22/14 Creatinine 1.97 K 3.1 Dig level 1.2  04/23/14 Creatinine 1.45 K 3.4 NA 136  04/24/13 creatinine 1.35 04/27/13 K 5.0 Creatinine 1.4   Objective:   Weight Range:  Vital Signs:   Temp:  [97.7 F (36.5 C)-98 F (36.7 C)] 98 F (36.7 C) (01/04 0600) Pulse Rate:  [57-62] 57 (01/04 0600) Resp:  [18-20] 20 (01/04 0600) BP: (100-130)/(43-60) 100/60 mmHg (01/04 0600) SpO2:  [96 %-99 %] 96 % (01/04 0600) Weight:  [207 lb 0.2 oz (93.9 kg)] 207 lb 0.2 oz (93.9 kg) (01/04 0551) Last BM Date: 04/23/15  Weight change: Filed Weights   04/25/14 0537 04/26/14  0500 04/27/14 0551  Weight: 206 lb 12.7 oz (93.8 kg) 207 lb 14.3 oz (94.3 kg) 207 lb 0.2 oz (93.9 kg)    Intake/Output:   Intake/Output Summary (Last 24 hours) at 04/27/14 0918 Last data filed at 04/27/14 0551  Gross per 24 hour  Intake    240 ml  Output    600 ml  Net   -360 ml    Physical Exam: General: Chronically ill appearing. No resp difficulty. Sitting in wheelchair.  HEENT: normal Neck: supple. JVP flat . Carotids 2+ bilat; no bruits. No lymphadenopathy or thryomegaly appreciated. Cor: PMI nondisplaced. Regular rate & rhythm. No rubs, gallops or murmurs. Lungs: clear on 2 liters  Abdomen: soft, nontender, distended. No hepatosplenomegaly. No bruits or masses. Good bowel sounds. Extremities: no cyanosis, clubbing, rash, edema. LUE LLE weakness noted.  Neuro: alert & orientedx3, cranial nerves grossly intact. moves all 4 extremities w/o difficulty. Affect pleasant   Labs: Basic Metabolic Panel:  Recent Labs Lab 04/22/14 0400 04/23/14 0605 04/24/14 0524 04/26/14 0705 04/27/14 0820  NA 130* 136 131* 132* 135  K 3.1* 3.4* 3.9 4.6 5.0  CL 90* 100 100 99 102  CO2 29 26 24 29 27   GLUCOSE 162* 120* 157* 131* 149*  BUN 112* 60* 41* 34* 32*  CREATININE 1.97* 1.45* 1.35 1.31 1.40*  CALCIUM 8.1* 8.2* 8.1* 8.2* 8.6    Liver Function Tests:  Recent Labs Lab 04/22/14 0400  ALBUMIN 3.2*   No results for input(s): LIPASE, AMYLASE in  the last 168 hours. No results for input(s): AMMONIA in the last 168 hours.  CBC: No results for input(s): WBC, NEUTROABS, HGB, HCT, MCV, PLT in the last 168 hours.  Cardiac Enzymes: No results for input(s): CKTOTAL, CKMB, CKMBINDEX, TROPONINI in the last 168 hours.  BNP: BNP (last 3 results)  Recent Labs  09/19/13 1941 09/24/13 0400 01/14/14 1050  PROBNP 3530.0* 1792.0* 872.6*     Other results:  EKG:   Imaging: No results found.   Medications:     Scheduled Medications: . antiseptic oral rinse  7 mL Mouth  Rinse BID  . atorvastatin  80 mg Oral q1800  . budesonide-formoterol  2 puff Inhalation BID  . carvedilol  12.5 mg Oral BID WC  . diclofenac sodium  2 g Topical QID  . digoxin  0.125 mg Oral Q48H  . feeding supplement (ENSURE COMPLETE)  237 mL Oral BID BM  . feeding supplement (PRO-STAT SUGAR FREE 64)  30 mL Oral BID BM  . feeding supplement (RESOURCE BREEZE)  1 Container Oral TID BM  . mirtazapine  7.5 mg Oral QHS  . MUSCLE RUB   Topical TID WC  . pantoprazole  40 mg Oral Daily  . potassium chloride SA  20 mEq Oral BID  . senna-docusate  2 tablet Oral QHS  . spironolactone  12.5 mg Oral Daily  . tiZANidine  8 mg Oral BID    Infusions:    PRN Medications: acetaminophen, alum & mag hydroxide-simeth, bisacodyl, diphenhydrAMINE, guaiFENesin-dextromethorphan, prochlorperazine **OR** prochlorperazine **OR** prochlorperazine, traMADol, traZODone   Assessment/Plan   [name] is 64 year old with known biventricular HF and chronic A fib followed closely in the HF clinc. Admitted CIR after ICH requiring ongoing trehabilitation due deficits noted on the Left. The HF team was asked to consult by Dr Naaman Plummer for HF and worsening renal function.   1. ICH: off anticoagulants. Plan to repeat CT of head 3-4 weeks to reconsider anticoagulants.  2. Chronic Biventricular HF R>L NICM, EF 40-45%, TAPSE < 1.0 (09/2013).  Weight has dropped significantly over the last week (16 pounds down). Weights now seem stable. Volume status stable. Renal function now stable. Keep off torsemide for now.  Would not start torsemide until weight 213 pounds or greater. Would give torsemide 20 mg po for weight 213 or greater. Continue carvedilol 12.5 mg twice a day. Continue dig 0.125 mg today then every other day. Check dig level this Friday. Continue  spironolactone 12.5 mg daily. Watch potassium closely.  Will need daily weights. Strict I/Os. 3. Severe COPD with cor pulmonale: on chronic oxygen.  4 Chronic A fib s/p  MAZE: failed DC-CV (09/2013). Today EKG appears to be NSR. Not on anticoagulants as above (ICH). Per Neuro will need repeat CT before considering anticoagulants. However, given ICH on Eliquis (lower risk for ICH than warfarin), I am concerned that restarting anticoagulation will be high risk.  5. AKI: Suspect prerenal azotemia. Creatinine back to baseline. 6. Hypokalemia: Resolved.  7. Hyponatremia: Suspect hypovolemic hyponatremia (use of metolazone - thiazide diuretic - also likely contributes). Continues to improve.     Length of Stay: 14  CLEGG,AMY NP-C   04/27/2014, 9:18 AM   Patient seen with NP, agree with the above note.  Patient doing well today.  Weight stable.  Continue current meds except would stop K supplementation.  Use torsemide 20 mg daily prn weight 213 or greater.  We will follow from a distance.   Loralie Champagne 04/27/2014 10:51 AM

## 2014-04-27 NOTE — Progress Notes (Signed)
Physical Therapy Session Note  Patient Details  Name: [name] MRN: [id number] Date of Birth: 1951/01/27  Today's Date: 04/27/2014 PT Individual Time: 0800-0830 and 1500-1600 PT Individual Time Calculation (min): 30 min and 60 min  Short Term Goals: Week 2:  PT Short Term Goal 1 (Week 2): Pt will perform supine > sit with mod A with HOB flat using bed rail. PT Short Term Goal 2 (Week 2): Pt will perform sit > supine with HOB flat and mod A. PT Short Term Goal 3 (Week 2): Pt will consistently transfer from bed<>w/c with mod A of single therapist. PT Short Term Goal 4 (Week 2): Pt will perform w/c mobility x75' with supervision and 25% cueing for technique.  Skilled Therapeutic Interventions/Progress Updates:    AM Session: Patient received semi-reclined in bed. Session focused on bed mobility and functional transfers. Patient performs semi-reclined>sit with HOB elevated and use of bed rails with minA for L LE management, min verbal cues for attention to L side of body as patient leaving it behind him. Patient performed scooting along EOB to B sides with emphasis on anterior weight shifts and slow, controlled movements to perform small lift rather than rapid, uncontrolled movements. Patient performed squat pivot transfer (going to his L) bed>wheelchair with maxA, max verbal/visual cues to perform scooting back in wheelchair for low back to be properly supported. Wheelchair mobility 74' with B LEs and mod A, emphasis on attention to and use of L LE during propulsion. Patient left sitting in wheelchair with all needs within reach.  PM Session: Patient received semi-reclined in bed. Session focused on functional transfers, wheelchair mobility, L UE/LE NMR and coordination, and sitting/standing balance. Patient performing multiple squat pivot transfers to B sides with maxA, +2 for wheelchair stabilization or L UE management/facilitated weight bearing. Emphasis on anterior weight shifts and slow,  controlled movements to perform small lift to transfer. Wheelchair mobility x100' with minA with use of R UE/LE.  Sitting on elevated mat with feet unsupported, L LE coordination activities including knee flexion/extension with foot on soccer ball, passing soccer ball from L foot to R foot, and lifting soccer ball by squeezing it between B feet (while maintaining SLR to lift) to facilitate increased motor control, grading, and sequencing of L LE while challenging sitting balance and core strength. Static standing x2 (second bout with yoga block positioned between B knees to improve alignment and decrease valgus) with B UE support and +2 assist, emphasis on upright posture, midline orientation, and equal weight bearing. Patient returned to room and left sitting in wheelchair with all needs within reach.  Therapy Documentation Precautions:  Precautions Precautions: Fall Restrictions Weight Bearing Restrictions: No Vital Signs: Therapy Vitals Oxygen Therapy SpO2: 96 % O2 Device: Nasal Cannula O2 Flow Rate (L/min): 1 L/min Pain: Pain Assessment Pain Assessment: No/denies pain Pain Score: 0-No pain Locomotion : Ambulation Ambulation/Gait Assistance: Not tested (comment)   See FIM for current functional status  Therapy/Group: Individual Therapy  Lillia Abed. Iyania Denne, PT, DPT 04/27/2014, 8:18 AM

## 2014-04-27 NOTE — Consult Note (Signed)
DIAGNOSTIC EVALUATION - CONFIDENTIAL [facility]   MEDICAL NECESSITY:  [name] was seen on the [facility] for an initial diagnostic evaluation owing to the patient's diagnosis of intracerebral hemorrhage.   According to medical records, Mr. Klemens was admitted to the rehab unit owing to "Functional deficits secondary to right BG/IC hemorrhage with left hemiparesis and hemi-sensory loss." Records also indicate that he is a "64 y.o. right handed male [with] history of HTN, COPD-oxygen dependent, A fib on Eliquis, s/p MAZE June 2014. He was independent prior to admission, lives alone and was using a walker due to back pain.  He was admitted on 04/07/2014 with left-sided weakness and mild slurred speech. CCT showed a right IC basal ganglia hemorrhage with a 4 mm right to left midline shift and BP 158/85 at admission. Eliquis was reversed with Kcentra and BP monitored closely. Recommendations to repeat CCT in 3-4 weeks to help determine ability to resume Eliquis.    Mr. Dewolfe was previously seen by this provider on 04/23/2014. Pertinent information from that visit is as follows:   Overall, Mr. Deakin endorsed suffering from a mild adjustment reaction in light of his present medical situation. No major cognitive issues were reported or observed. Patient has some trepidation about discharging home and social work later informed me that he will be going to skilled nursing. I plan to follow-up with Mr. Bernhart on Monday for supportive psychotherapy. No medication recommendations offered.  During today's visit, Mr. Mcginley continues to deny suffering any cognitive difficulty.  He also continues to describe his mood as "up and down" but "pretty good." He seemed much more distant today and maintained little eye contact. He also tended not to engage as much in the discussion and spontaneously generated very little conversation. He said that he  is still more irritable with new people, but this has not been a barrier to therapy. Since our last visit, he learned that he will be going to another rehab facility and he seemed okay with this. He said that he realizes that he is not ready to return home. Suicidal/homicidal ideation, plan or intent was denied. No manic or hypomanic episodes were reported. The patient denied ever experiencing any auditory/visual hallucinations. No major behavioral or personality changes were endorsed. Mr. Gieske continues to believe that he is making progress in therapy. The staff was described as "good." He still has ample support via friends and family.   PROCEDURES ADMINISTERED: [1 unit 01749] Diagnostic clinical interview  Review of available records  Behavioral Evaluation: Mr. Burggraf was appropriately dressed for season and situation, and he appeared tidy and well-groomed. Normal posture was noted. He was quieter than before and generated little conversation. He seemed distant, though he was generally friendly and rapport was adequately established. His speech was as expected and he was able to express ideas effectively. His affect was somewhat blunted. Attention and motivation were good.    IMPRESSION: Mr. Swoboda seemed more distant today and a few times the conversation he generated was out of context. I wonder if he is suffering from some degree of cognitive deficit. Emotionally, he is adjusting well to his admission and present medical situation. Although not preferable, he is okay with discharging to another rehab facility.   I believe cognitive screen would be valuable. Please schedule this for this Thursday with Dr. Beverly Gust. I will follow-up with Mr. Dobbs next Monday prior to his discharge.    DIAGNOSES:  Intracerebral hemorrhage  Adjustment disorder  with depressed mood (mild)    Rutha Bouchard, Psy.D.  Clinical Neuropsychologist

## 2014-04-27 NOTE — Progress Notes (Signed)
63 y.o. right handed male history of HTN, COPD-oxygen dependent, A fib on Eliquis, s/p MAZE June 2014. He was independent prior to admission, lives alone and was using a walker due to back pain.  He was admitted on 04/07/2014 past  with left-sided weakness and mild slurred speech. CCT showed a right IC basal ganglia hemorrhage with a 4 mm right to left midline shift and BP 158/85 at admission. Eliquis was  reversed with Kcentra and BP monitored closely. Recommendations to repeat CCT in 3-4 weeks to help determine ability to resume Eliquis.  Repeat carotid dopplers done revealing 60- 79% L-ICA and 40- 59% R-ICA stenosis   Subjective/Complaints: No bad pain but no BM in 2 d  Review of Systems - mild pain  Objective: Vital Signs: Blood pressure 100/60, pulse 57, temperature 98 F (36.7 C), temperature source Oral, resp. rate 20, height _0  (1.778 m), weight 93.9 kg (207 lb 0.2 oz), SpO2 96 %. No results found. Results for orders placed or performed during the hospital encounter of 04/13/14 (from the past 72 hour(s))  Basic metabolic panel     Status: Abnormal   Collection Time: 04/26/14  7:05 AM  Result Value Ref Range   Sodium 132 (L) 135 - 145 mmol/L    Comment: Please note change in reference range.   Potassium 4.6 3.5 - 5.1 mmol/L    Comment: Please note change in reference range.   Chloride 99 96 - 112 mEq/L   CO2 29 19 - 32 mmol/L   Glucose, Bld 131 (H) 70 - 99 mg/dL   BUN 34 (H) 6 - 23 mg/dL   Creatinine, Ser 1.31 0.50 - 1.35 mg/dL   Calcium 8.2 (L) 8.4 - 10.5 mg/dL   GFR calc non Af Amer 56 (L) >90 mL/min   GFR calc Af Amer 65 (L) >90 mL/min    Comment: (NOTE) The eGFR has been calculated using the CKD EPI equation. This calculation has not been validated in all clinical situations. eGFR's persistently <90 mL/min signify possible Chronic Kidney Disease.    Anion gap 4 (L) 5 - 15     HEENT: normal and O2 [place] Cardio: RRR and murmur Resp: CTA B/L and unlabored, no rales or  wheezes GI: BS positive and NT, ND Extremity:  No Edema Skin:   Bruise multiple eccymotic areas in BUE and BLE Neuro: Alert/Oriented, Cranial Nerve II-XII normal, Abnormal Sensory reduced on Left, Abnormal Motor 3/5 LUE, 3- Left HF, 4/5 L KE, 3- L ankle DF and Abnormal FMC Ataxic/ dec FMC, sensory absent  Musc/Skel:    No swelling or pain with AROM Right knee.   Gen NAD Psych: pleasant and appropriate. Reduced insight   Assessment/Plan: 1. Functional deficits secondary to right BG/IC hemorrhage with left hemiparesis and hemisensory loss which require 3+ hours per day of interdisciplinary therapy in a comprehensive inpatient rehab setting. Physiatrist is providing close team supervision and 24 hour management of active medical problems listed below. Physiatrist and rehab team continue to assess barriers to discharge/monitor patient progress toward functional and medical goals.  FIM: FIM - Bathing Bathing Steps Patient Completed: Chest, Left Arm, Abdomen, Front perineal area, Right upper leg, Left upper leg, Right Arm Bathing: 3: Mod-Patient completes 5-7 [id number] parts or 50-74%  FIM - Upper Body Dressing/Undressing Upper body dressing/undressing steps patient completed: Thread/unthread right sleeve of pullover shirt/dresss, Thread/unthread left sleeve of pullover shirt/dress, Put head through opening of pull over shirt/dress Upper body dressing/undressing: 5: Set-up assist to:  Obtain clothing/put away FIM - Lower Body Dressing/Undressing Lower body dressing/undressing steps patient completed: Thread/unthread right pants leg Lower body dressing/undressing: 0: Wears gown/pajamas-no public clothing  FIM - Musician Devices: Grab bar or rail for support Toileting: 0: Activity did not occur  FIM - Radio producer Devices: Bedside commode, Grab bars Toilet Transfers: 0-Activity did not occur  FIM - Buyer, retail Devices: Orthosis, Arm rests (R knee hyperextension brace, L hinged knee brace, L GivMohr sling) Bed/Chair Transfer: 2: Chair or W/C > Bed: Max A (lift and lower assist), 2: Bed > Chair or W/C: Max A (lift and lower assist), 4: Supine > Sit: Min A (steadying Pt. > 75%/lift 1 leg)  FIM - Locomotion: Wheelchair Distance: 55 Locomotion: Wheelchair: 1: Total Assistance/staff pushes wheelchair (Pt<25%) FIM - Locomotion: Ambulation Locomotion: Ambulation Assistive Devices: Orthosis, Other (comment) (R Swedish knee cage; L hinged knee brace) Ambulation/Gait Assistance: 1: +2 Total assist, Other (comment) (+3A (3 musketeers and additional person for w/c follow and O2 management)) Locomotion: Ambulation: 0: Activity did not occur  Comprehension Comprehension Mode: Auditory Comprehension: 5-Follows basic conversation/direction: With no assist  Expression Expression Mode: Verbal Expression: 5-Expresses basic needs/ideas: With no assist  Social Interaction Social Interaction Mode: Asleep Social Interaction: 5-Interacts appropriately 90% of the time - Needs monitoring or encouragement for participation or interaction.  Problem Solving Problem Solving: 5-Solves basic 90% of the time/requires cueing < 10% of the time  Memory Memory: 5-Recognizes or recalls 90% of the time/requires cueing < 10% of the time  Medical Problem List and Plan: 1. Functional deficits secondary to right BG/IC hemorrhage with left hemiparesis and hemisensory loss 2.  DVT Prophylaxis/Anticoagulation: Continue SCDs. Add TEDs. Monitor for any signs of DVT. Check follow up CT head early Jan.   3. Pain Management/chronic back pain: Tylenol as needed for pain. Add voltaren gel. Continue Zanaflex 8 mg twice a day 4. Dysphagia. Dysphagia 3 nectar liquids. Adv per slp 5. Neuropsych: This patient is capable of making decisions on her own behalf. 6. Skin/Wound Care: Routine skin checks 7. Fluids/Electrolytes/Nutrition:  improved intake. Off ivf. bmet tomorrow 8. Hypertension/CV. Monitor every 8 hours. Continue Coreg 12.5 mg twice a day, Aldactone 25 mg daily, Demadex as directed, Zaroxolyn 2.5 mg Monday Wednesday Friday.   -continue to hold diuretics except for aldactone  -potassium  supplementation    -po intake much improved  -appreciate cards follow up  -follow up bmet overall improved 9. Atrial fibrillation/status post Maze procedure June 2014: Eliquis on hold secondary to hemorrhage. Continue Lanoxin 0.25 mg every other day. Monitor HR tid.   10. Hyperlipidemia. Lipitor 11. History of gouty arthritis: held colchicine due to diarrhea . 12. COPD: Respiratory status stable. Continue Symbicort twice daily. 13. H/o  Peripheral edema with weeping:   Low salt diet.     14. Constipation: ----improved   LOS (Days) 14 A FACE TO FACE EVALUATION WAS PERFORMED  Oluwadamilola Deliz E 04/27/2014, 7:06 AM

## 2014-04-27 NOTE — Progress Notes (Signed)
Occupational Therapy Session Note  Patient Details  Name: [name] MRN: [id number] Date of Birth: 01-14-1951  Today's Date: 04/27/2014 OT Individual Time: 0272-5366 OT Individual Time Calculation (min): 60 min    Short Term Goals: Week 2:  OT Short Term Goal 1 (Week 2): Pt will complete bathing at sit <> stand level with max assist of 1 person OT Short Term Goal 2 (Week 2): Pt will complete UB dressing with min assist while seated unsupported OT Short Term Goal 3 (Week 2): Pt will complete LB dressing at sit <> stand level with max assist of 1 person OT Short Term Goal 4 (Week 2): Pt will complete toilet transfer to [facility] with mod assist of 1 person  Skilled Therapeutic Interventions/Progress Updates:    Engaged in ADL retraining with focus on functional use of LUE, sit <> stand, and standing balance with self-care tasks of bathing and dressing.  Bathing and dressing completed at sink with focus on use of LUE with placing wash cloth in basin and washing Rt arm.  UB dressing completed supervision this session.  Engaged in sit <> stand x3 with +2 assist with focus on upright, midline posture and weight bearing through BLE while complete perineal hygiene and LB dressing.  Pt tolerated session on 1L O2 with sats remaining > 95%.  Therapy Documentation Precautions:  Precautions Precautions: Fall Restrictions Weight Bearing Restrictions: No Pain: Pain Assessment Pain Assessment: No/denies pain Pain Score: 0-No pain  See FIM for current functional status  Therapy/Group: Individual Therapy  Shanaye Rief, [place] 04/27/2014, 10:02 AM

## 2014-04-28 ENCOUNTER — Inpatient Hospital Stay (HOSPITAL_COMMUNITY): Payer: BLUE CROSS/BLUE SHIELD | Admitting: Physical Therapy

## 2014-04-28 ENCOUNTER — Inpatient Hospital Stay (HOSPITAL_COMMUNITY): Payer: Self-pay | Admitting: Speech Pathology

## 2014-04-28 ENCOUNTER — Inpatient Hospital Stay (HOSPITAL_COMMUNITY): Payer: BLUE CROSS/BLUE SHIELD | Admitting: Occupational Therapy

## 2014-04-28 DIAGNOSIS — R600 Localized edema: Secondary | ICD-10-CM

## 2014-04-28 NOTE — Progress Notes (Signed)
Physical Therapy Session Note  Patient Details  Name: [name] MRN: [id number] Date of Birth: 12/13/1950  Today's Date: 04/28/2014 PT Individual Time: 1100-1200 PT Individual Time Calculation (min): 60 min   Short Term Goals: Week 2:  PT Short Term Goal 1 (Week 2): Pt will perform supine > sit with mod A with HOB flat using bed rail. PT Short Term Goal 2 (Week 2): Pt will perform sit > supine with HOB flat and mod A. PT Short Term Goal 3 (Week 2): Pt will consistently transfer from bed<>w/c with mod A of single therapist. PT Short Term Goal 4 (Week 2): Pt will perform w/c mobility x75' with supervision and 25% cueing for technique.  Skilled Therapeutic Interventions/Progress Updates:    Pt received seated in w/c on 1.5 L/min supplemental O2, [place]; agreeable to therapy. Session focused on increasing pt independence with functional transfers. Pt performed squat pivot transfers from w/c<>chair with max A, multimodal cueing for setup, technique (focus on UE/LE placement/awareness) and manual stabilization of LUE (secondary to decreased proprioception, motor control, and awareness of LUE). Transitioned to blocked practice of squat pivot transfers from w/c<>bedside commode with mod A for majority of transfers, max to +2A for <25% of transfers. Pt required less assist when LUE was incorporated into transfer due to improved grading/control of transfer. On average, pt required more assistance when transferring to L side secondary to tendency toward quick, uncontrolled movement causing poor placement of hips on bedside commode. Also of note, pt required less assistance when cued to position knees away from targeted destination to initiate head/hips relationship. Pt also appeared to benefit from 1-2 lbs weights at L wrist for increased proprioception, awareness of LUE placement. Departed with pt seated in w/c with L half lap tray on , lunch tray set up, and all needs within reach.  Therapy  Documentation Precautions:  Precautions Precautions: Fall Restrictions Weight Bearing Restrictions: No Pain: Pain Assessment Pain Assessment: No/denies pain Pain Score: 0-No pain  See FIM for current functional status  Therapy/Group: Individual Therapy  Hobble, Malva Cogan 04/28/2014, 11:32 AM

## 2014-04-28 NOTE — Progress Notes (Signed)
Social Work Patient ID: [name], male   DOB: July 20, 1950, 64 y.o.   MRN: [id number]   CSW met with pt to discuss pt's plans at d/c.  Pt recognizes that he cannot go home alone in his current condition.  Pt told CSW that he is open to SNF transfer and that his dtr is already looking into SNFs for him.  Pt would like for CSW to talk with Gwynneth Aliment, his dtr, to discuss with her and find where she is in her planning process.  CSW left Judson Roch a message and await a return call from her to proceed with SNF search for pt.

## 2014-04-28 NOTE — Progress Notes (Signed)
Speech Language Pathology Daily Session Note  Patient Details  Name: [name] MRN: [id number] Date of Birth: 09/04/1950  Today's Date: 04/28/2014 SLP Individual Time: 0900-1000 SLP Individual Time Calculation (min): 60 min  Short Term Goals: Week 3: SLP Short Term Goal 1 (Week 3): Pt will demonstrate improved mastication for toleration of regular textures with Mod I  SLP Short Term Goal 2 (Week 3): Pt will complete oral motor exercises targeting left sided weakness to improve mastication and manipulation of regular solid boluses with supervision.  SLP Short Term Goal 3 (Week 3): Pt will improve recall of semi-complex information via use of compensatory strategies over 80% of observable opportunities with supervision cues.  SLP Short Term Goal 4 (Week 3): Pt will improve functional problem solving skills (i.e. planning, thought organization, and error awareness) during complex tasks over 80% of observable opportunities with supervision cues.   Skilled Therapeutic Interventions:  Pt was seen for skilled ST targeting cognitive goals.  Upon arrival, pt was seated upright in wheelchair, awake, alert, and pleasantly interactive.  SLP facilitated the session with a semi-complex functional task targeting planning, organization, working memory, and mental flexibility.  Pt required overall supervision-min assist verbal cues and increased processing time to complete the abovementioned task and demonstrated good insight into task difficulty, stating "This is going slow."  Upon return to room, pt utilized his schedule to facilitate prospective recall of upcoming therapies with supervision question cues.  Overall, pt making steady progress towards meeting goals.  Continue per current plan of care.    FIM:  Comprehension Comprehension Mode: Auditory Comprehension: 5-Follows basic conversation/direction: With no assist Expression Expression Mode: Verbal Expression: 5-Expresses basic 90% of the  time/requires cueing < 10% of the time. Social Interaction Social Interaction: 5-Interacts appropriately 90% of the time - Needs monitoring or encouragement for participation or interaction. Problem Solving Problem Solving: 4-Solves basic 75 - 89% of the time/requires cueing 10 - 24% of the time Memory Memory: 5-Recognizes or recalls 90% of the time/requires cueing < 10% of the time  Pain Pain Assessment Pain Assessment: No/denies pain Pain Score: 0-No pain  Therapy/Group: Individual Therapy  [name], Selinda Orion 04/28/2014, 9:47 AM

## 2014-04-28 NOTE — Progress Notes (Signed)
64 y.o. right handed male history of HTN, COPD-oxygen dependent, A fib on Eliquis, s/p MAZE June 2014. He was independent prior to admission, lives alone and was using a walker due to back pain.  He was admitted on 04/07/2014 past  with left-sided weakness and mild slurred speech. CCT showed a right IC basal ganglia hemorrhage with a 4 mm right to left midline shift and BP 158/85 at admission. Eliquis was  reversed with Kcentra and BP monitored closely. Recommendations to repeat CCT in 3-4 weeks to help determine ability to resume Eliquis.  Repeat carotid dopplers done revealing 60- 79% L-ICA and 40- 59% R-ICA stenosis   Subjective/Complaints: Had "ok" day yesterday, appreciate Neuropsych note  Review of Systems - mild pain  Objective: Vital Signs: Blood pressure 146/56, pulse 59, temperature 97.8 F (36.6 C), temperature source Oral, resp. rate 17, height '5\' 10"'  (1.778 m), weight 93.2 kg (205 lb 7.5 oz), SpO2 97 %. No results found. Results for orders placed or performed during the hospital encounter of 04/13/14 (from the past 72 hour(s))  Basic metabolic panel     Status: Abnormal   Collection Time: 04/26/14  7:05 AM  Result Value Ref Range   Sodium 132 (L) 135 - 145 mmol/L    Comment: Please note change in reference range.   Potassium 4.6 3.5 - 5.1 mmol/L    Comment: Please note change in reference range.   Chloride 99 96 - 112 mEq/L   CO2 29 19 - 32 mmol/L   Glucose, Bld 131 (H) 70 - 99 mg/dL   BUN 34 (H) 6 - 23 mg/dL   Creatinine, Ser 1.31 0.50 - 1.35 mg/dL   Calcium 8.2 (L) 8.4 - 10.5 mg/dL   GFR calc non Af Amer 56 (L) >90 mL/min   GFR calc Af Amer 65 (L) >90 mL/min    Comment: (NOTE) The eGFR has been calculated using the CKD EPI equation. This calculation has not been validated in all clinical situations. eGFR's persistently <90 mL/min signify possible Chronic Kidney Disease.    Anion gap 4 (L) 5 - 15  Basic metabolic panel     Status: Abnormal   Collection Time:  04/27/14  8:20 AM  Result Value Ref Range   Sodium 135 135 - 145 mmol/L    Comment: Please note change in reference range.   Potassium 5.0 3.5 - 5.1 mmol/L    Comment: Please note change in reference range.   Chloride 102 96 - 112 mEq/L   CO2 27 19 - 32 mmol/L   Glucose, Bld 149 (H) 70 - 99 mg/dL   BUN 32 (H) 6 - 23 mg/dL   Creatinine, Ser 1.40 (H) 0.50 - 1.35 mg/dL   Calcium 8.6 8.4 - 10.5 mg/dL   GFR calc non Af Amer 52 (L) >90 mL/min   GFR calc Af Amer 60 (L) >90 mL/min    Comment: (NOTE) The eGFR has been calculated using the CKD EPI equation. This calculation has not been validated in all clinical situations. eGFR's persistently <90 mL/min signify possible Chronic Kidney Disease.    Anion gap 6 5 - 15     HEENT: normal and O2 [place] Cardio: RRR and murmur Resp: CTA B/L and unlabored, no rales or wheezes GI: BS positive and NT, ND Extremity:  No Edema Skin:   Bruise multiple eccymotic areas in BUE and BLE Neuro: Alert/Oriented, Cranial Nerve II-XII normal, Abnormal Sensory reduced on Left, Abnormal Motor 3/5 LUE, 3- Left HF, 4/5 L  KE, 3- L ankle DF and Abnormal FMC Ataxic/ dec FMC, sensory absent  Musc/Skel:    No swelling or pain with AROM Right knee.   Gen NAD Psych: pleasant and appropriate. Reduced insight   Assessment/Plan: 1. Functional deficits secondary to right BG/IC hemorrhage with left hemiparesis and hemisensory loss which require 3+ hours per day of interdisciplinary therapy in a comprehensive inpatient rehab setting. Physiatrist is providing close team supervision and 24 hour management of active medical problems listed below. Physiatrist and rehab team continue to assess barriers to discharge/monitor patient progress toward functional and medical goals.  FIM: FIM - Bathing Bathing Steps Patient Completed: Chest, Right Arm, Left Arm, Abdomen, Front perineal area, Right upper leg, Left upper leg Bathing: 3: Mod-Patient completes 5-7 [id number] parts or 50-74%  FIM  - Upper Body Dressing/Undressing Upper body dressing/undressing steps patient completed: Thread/unthread right sleeve of pullover shirt/dresss, Thread/unthread left sleeve of pullover shirt/dress, Put head through opening of pull over shirt/dress, Pull shirt over trunk Upper body dressing/undressing: 5: Set-up assist to: Obtain clothing/put away FIM - Lower Body Dressing/Undressing Lower body dressing/undressing steps patient completed: Thread/unthread right pants leg Lower body dressing/undressing: 1: Two helpers  FIM - TMusicianDevices: Grab bar or rail for support Toileting: 0: Activity did not occur  FIM - TRadio producerDevices: Bedside commode, Grab bars Toilet Transfers: 0-Activity did not occur  FIM - BControl and instrumentation engineerDevices: HOB elevated, Bed rails, Arm rests Bed/Chair Transfer: 2: Bed > Chair or W/C: Max A (lift and lower assist), 4: Supine > Sit: Min A (steadying Pt. > 75%/lift 1 leg), 2: Chair or W/C > Bed: Max A (lift and lower assist)  FIM - Locomotion: Wheelchair Distance: 100 Locomotion: Wheelchair: 2: Travels 50 - 149 ft with minimal assistance (Pt.>75%) FIM - Locomotion: Ambulation Locomotion: Ambulation Assistive Devices: Orthosis, Other (comment) (R Swedish knee cage; L hinged knee brace) Ambulation/Gait Assistance: Not tested (comment) Locomotion: Ambulation: 0: Activity did not occur  Comprehension Comprehension Mode: Auditory Comprehension: 5-Follows basic conversation/direction: With no assist  Expression Expression Mode: Verbal Expression: 5-Expresses basic 90% of the time/requires cueing < 10% of the time.  Social Interaction Social Interaction Mode: Asleep Social Interaction: 5-Interacts appropriately 90% of the time - Needs monitoring or encouragement for participation or interaction.  Problem Solving Problem Solving: 4-Solves basic 75 - 89% of the time/requires cueing  10 - 24% of the time  Memory Memory: 5-Recognizes or recalls 90% of the time/requires cueing < 10% of the time  Medical Problem List and Plan: 1. Functional deficits secondary to right BG/IC hemorrhage with left hemiparesis and hemisensory loss 2.  DVT Prophylaxis/Anticoagulation: Continue SCDs. Add TEDs. Monitor for any signs of DVT. Check follow up CT head early Jan.   3. Pain Management/chronic back pain: Tylenol as needed for pain. Add voltaren gel. Continue Zanaflex 8 mg twice a day 4. Dysphagia. Dysphagia 3 nectar liquids. Adv per slp 5. Neuropsych: This patient is capable of making decisions on her own behalf. 6. Skin/Wound Care: Routine skin checks 7. Fluids/Electrolytes/Nutrition: improved intake. Off ivf. bmet stable but had CKD 8. Hypertension/CV. Monitor every 8 hours. Continue Coreg 12.5 mg twice a day, Aldactone 25 mg daily, Demadex as directed, Zaroxolyn 2.5 mg Monday Wednesday Friday.   -continue to hold diuretics except for aldactone  -potassium  supplementation    -po intake much improved  -appreciate cards follow up  -follow up bmet overall improved 9. Atrial fibrillation/status post Maze procedure  June 2014: Eliquis on hold secondary to hemorrhage. Continue Lanoxin 0.25 mg every other day. Monitor HR tid.   10. Hyperlipidemia. Lipitor 11. History of gouty arthritis: held colchicine due to diarrhea . 12. COPD: Respiratory status stable. Continue Symbicort twice daily. 13. H/o  Peripheral edema with weeping:   Low salt diet.     14. Constipation: ----improved   LOS (Days) 15 A FACE TO FACE EVALUATION WAS PERFORMED  Kymberlyn Eckford E 04/28/2014, 7:06 AM

## 2014-04-28 NOTE — Progress Notes (Signed)
Occupational Therapy Session Note  Patient Details  Name: [name] MRN: [id number] Date of Birth: 06/09/1950  Today's Date: 04/28/2014 OT Individual Time: 6333-5456 OT Individual Time Calculation (min): 60 min    Short Term Goals: Week 2:  OT Short Term Goal 1 (Week 2): Pt will complete bathing at sit <> stand level with max assist of 1 person OT Short Term Goal 2 (Week 2): Pt will complete UB dressing with min assist while seated unsupported OT Short Term Goal 3 (Week 2): Pt will complete LB dressing at sit <> stand level with max assist of 1 person OT Short Term Goal 4 (Week 2): Pt will complete toilet transfer to [facility] with mod assist of 1 person  Skilled Therapeutic Interventions/Progress Updates:    Engaged in ADL retraining with focus on sit > stand, standing balance, transfers, and functional use of LUE during self-care tasks.  Pt appeared in brighter spirits this session.  Engaged in bathing and dressing seated at EOB with focus on use of LUE to assist with washing and LB dressing.  Pt able to thread Rt pant leg this session, required +2 to maintain upright standing balance while pulling up pants.  Engaged in sit > stand x2 progressing from +2 to mod assist of one therapist with 2nd person for safety.  Transfer training with use of drop arm BSC with 2nd person for safety.  Pt able to complete squat pivot transfer bed <> BSC with mod assist and multimodal cues for placement of LUE to increase safety due to decreased sensation and proprioception.  Pt demonstrating increased motor control and carryover of education this session.  Therapy Documentation Precautions:  Precautions Precautions: Fall Restrictions Weight Bearing Restrictions: No Pain: Pain Assessment Pain Assessment: No/denies pain Pain Score: 0-No pain  See FIM for current functional status  Therapy/Group: Individual Therapy  Simonne Come 04/28/2014, 10:50 AM

## 2014-04-28 NOTE — Progress Notes (Signed)
Social Work Patient ID: [name], male   DOB: Sep 12, 1950, 64 y.o.   MRN: [id number]   CSW spoke with dtr who would like to pursue SNF placement for pt and preferences of facilities were discussed.  CSW to complete FL2 and fax it to SNFs.  CSW to also determine insurance coverage for SNF to inform dtr/pt.  CSW to discuss transfer date to SNF with team at conference tomorrow.

## 2014-04-29 ENCOUNTER — Inpatient Hospital Stay (HOSPITAL_COMMUNITY): Payer: BLUE CROSS/BLUE SHIELD | Admitting: Occupational Therapy

## 2014-04-29 ENCOUNTER — Inpatient Hospital Stay (HOSPITAL_COMMUNITY): Payer: Self-pay | Admitting: Speech Pathology

## 2014-04-29 ENCOUNTER — Inpatient Hospital Stay (HOSPITAL_COMMUNITY): Payer: BLUE CROSS/BLUE SHIELD | Admitting: Physical Therapy

## 2014-04-29 DIAGNOSIS — J438 Other emphysema: Secondary | ICD-10-CM

## 2014-04-29 MED ORDER — TIZANIDINE HCL 4 MG PO TABS
4.0000 mg | ORAL_TABLET | Freq: Three times a day (TID) | ORAL | Status: DC
  Administered 2014-04-29 – 2014-05-05 (×18): 4 mg via ORAL
  Filled 2014-04-29 (×21): qty 1

## 2014-04-29 MED ORDER — POLYETHYLENE GLYCOL 3350 17 G PO PACK
17.0000 g | PACK | ORAL | Status: DC
  Administered 2014-04-29 – 2014-05-03 (×2): 17 g via ORAL
  Filled 2014-04-29 (×4): qty 1

## 2014-04-29 NOTE — Plan of Care (Signed)
Problem: RH Car Transfers Goal: LTG Patient will perform car transfers with assist (PT) LTG: Patient will perform car transfers with assistance (PT).  Outcome: Not Applicable Date Met:  79/55/83 N/A secondary to change in D/C plan to SNF placement.  Problem: RH Furniture Transfers Goal: LTG Patient will perform furniture transfers w/assist (OT/PT LTG: Patient will perform furniture transfers with assistance (OT/PT).  Outcome: Not Applicable Date Met:  16/74/25 N/A secondary to change in D/C plan to SNF placement.  Problem: RH Wheelchair Mobility Goal: LTG Patient will propel w/c in home environment (PT) LTG: Patient will propel wheelchair in home environment, # of feet with assistance (PT).  Outcome: Not Applicable Date Met:  52/58/94 N/A secondary to change in D/C plan to SNF placement.

## 2014-04-29 NOTE — Progress Notes (Signed)
Speech Language Pathology Daily Session Note  Patient Details  Name: [name] MRN: [id number] Date of Birth: May 03, 1950  Today's Date: 04/29/2014 SLP Individual Time: 0900-1000 SLP Individual Time Calculation (min): 60 min  Short Term Goals: Week 3: SLP Short Term Goal 1 (Week 3): Pt will demonstrate improved mastication for toleration of regular textures with Mod I  SLP Short Term Goal 2 (Week 3): Pt will complete oral motor exercises targeting left sided weakness to improve mastication and manipulation of regular solid boluses with supervision.  SLP Short Term Goal 3 (Week 3): Pt will improve recall of semi-complex information via use of compensatory strategies over 80% of observable opportunities with supervision cues.  SLP Short Term Goal 4 (Week 3): Pt will improve functional problem solving skills (i.e. planning, thought organization, and error awareness) during complex tasks over 80% of observable opportunities with supervision cues.   Skilled Therapeutic Interventions:  Pt was seen for skilled ST targeting cognitive goals.  Upon arrival, pt was seated upright in wheelchair, awake, alert, and pleasantly interactive.  SLP provided supervision question cues for recall of activity from previous therapy session.  Pt completed the previously targeted problem solving activity with initial supervision cues which SLP faded to Mod I with increased task familiarity.  Pt was noted with significantly improved processing speed during today's which SLP suspects stemmed from overall brighter affect and improved confidence when completing task.  SLP increased task challenge with functional math calculations related to the abovementioned problem solving activity, with pt requiring overall min assist-supervision cues to complete for 100% accuracy.  Pt noted with relatively good self monitoring and correcting of errors with very subtle cues. Upon completion of activity, SLP initiated skilled education  related to oral motor strengthening exercises targeting decreased labial and buccal weakness to facilitate improved mastication and oral control of boluses, in preparation for trials of regular solid consistencies.  Pt returned demonstration of oral motor exercises with overall supervision instructional cues.  Continue per current plan of care.   FIM:  Comprehension Comprehension Mode: Auditory Comprehension: 5-Follows basic conversation/direction: With extra time/assistive device Expression Expression Mode: Verbal Expression: 5-Expresses basic needs/ideas: With no assist Social Interaction Social Interaction: 5-Interacts appropriately 90% of the time - Needs monitoring or encouragement for participation or interaction. Problem Solving Problem Solving: 5-Solves basic 90% of the time/requires cueing < 10% of the time Memory Memory: 4-Recognizes or recalls 75 - 89% of the time/requires cueing 10 - 24% of the time FIM - Eating Eating Activity: 6: Swallowing techniques: self-managed  Pain Pain Assessment Pain Assessment: Faces Pain Score: 2  Faces Pain Scale: Hurts little more Pain Type: Acute pain Pain Location: Shoulder Pain Orientation: Left Pain Descriptors / Indicators: Aching Pain Frequency: Intermittent Pain Onset: With Activity Patients Stated Pain Goal: 3 Pain Intervention(s): RN made aware  Therapy/Group: Individual Therapy  Elin Seats, Selinda Orion 04/29/2014, 12:21 PM

## 2014-04-29 NOTE — Progress Notes (Signed)
64 y.o. right handed male history of HTN, COPD-oxygen dependent, A fib on Eliquis, s/p MAZE June 2014. He was independent prior to admission, lives alone and was using a walker due to back pain.  He was admitted on 04/07/2014 past  with left-sided weakness and mild slurred speech. CCT showed a right IC basal ganglia hemorrhage with a 4 mm right to left midline shift and BP 158/85 at admission. Eliquis was  reversed with Kcentra and BP monitored closely. Recommendations to repeat CCT in 3-4 weeks to help determine ability to resume Eliquis.  Repeat carotid dopplers done revealing 60- 79% L-ICA and 40- 59% R-ICA stenosis   Subjective/Complaints: Discussed repeat CCT Pt is a bit worried about going back on Eliquis   Review of Systems - mild pain  Objective: Vital Signs: Blood pressure 138/50, pulse 64, temperature 98.5 F (36.9 C), temperature source Oral, resp. rate 18, height '5\' 10"'  (1.778 m), weight 93.5 kg (206 lb 2.1 oz), SpO2 98 %. No results found. Results for orders placed or performed during the hospital encounter of 04/13/14 (from the past 72 hour(s))  Basic metabolic panel     Status: Abnormal   Collection Time: 04/27/14  8:20 AM  Result Value Ref Range   Sodium 135 135 - 145 mmol/L    Comment: Please note change in reference range.   Potassium 5.0 3.5 - 5.1 mmol/L    Comment: Please note change in reference range.   Chloride 102 96 - 112 mEq/L   CO2 27 19 - 32 mmol/L   Glucose, Bld 149 (H) 70 - 99 mg/dL   BUN 32 (H) 6 - 23 mg/dL   Creatinine, Ser 1.40 (H) 0.50 - 1.35 mg/dL   Calcium 8.6 8.4 - 10.5 mg/dL   GFR calc non Af Amer 52 (L) >90 mL/min   GFR calc Af Amer 60 (L) >90 mL/min    Comment: (NOTE) The eGFR has been calculated using the CKD EPI equation. This calculation has not been validated in all clinical situations. eGFR's persistently <90 mL/min signify possible Chronic Kidney Disease.    Anion gap 6 5 - 15     HEENT: normal and O2 [place] Cardio: RRR and  murmur Resp: CTA B/L and unlabored, no rales or wheezes GI: BS positive and NT, ND Extremity:  No Edema Skin:   Bruise multiple eccymotic areas in BUE and BLE Neuro: Alert/Oriented, Cranial Nerve II-XII normal, Abnormal Sensory reduced on Left, Abnormal Motor 3/5 LUE, 3- Left HF, 4/5 L KE, 3- L ankle DF and Abnormal FMC Ataxic/ dec FMC, sensory absent  Musc/Skel:    No swelling or pain with AROM Right knee.   Gen NAD Psych: pleasant and appropriate. Reduced insight   Assessment/Plan: 1. Functional deficits secondary to right BG/IC hemorrhage with left hemiparesis and hemisensory loss which require 3+ hours per day of interdisciplinary therapy in a comprehensive inpatient rehab setting. Physiatrist is providing close team supervision and 24 hour management of active medical problems listed below. Physiatrist and rehab team continue to assess barriers to discharge/monitor patient progress toward functional and medical goals. Team conference today please see physician documentation under team conference tab, met with team face-to-face to discuss problems,progress, and goals. Formulized individual treatment plan based on medical history, underlying problem and comorbidities. FIM: FIM - Bathing Bathing Steps Patient Completed: Chest, Right Arm, Left Arm, Abdomen, Right upper leg, Left upper leg Bathing: 3: Mod-Patient completes 5-7 [id number] parts or 50-74%  FIM - Upper Body Dressing/Undressing Upper body  dressing/undressing steps patient completed: Thread/unthread right sleeve of pullover shirt/dresss, Thread/unthread left sleeve of pullover shirt/dress, Put head through opening of pull over shirt/dress, Pull shirt over trunk Upper body dressing/undressing: 5: Set-up assist to: Obtain clothing/put away FIM - Lower Body Dressing/Undressing Lower body dressing/undressing steps patient completed: Thread/unthread right pants leg Lower body dressing/undressing: 1: Two helpers  FIM -  Musician Devices: Grab bar or rail for support Toileting: 0: Activity did not occur  FIM - Radio producer Devices: Bedside commode, Grab bars Toilet Transfers: 3-To toilet/BSC: Mod A (lift or lower assist), 3-From toilet/BSC: Mod A (lift or lower assist)  FIM - Control and instrumentation engineer Devices: Arm rests, Orthosis (bilat knee orthoses) Bed/Chair Transfer: 1: Two helpers, 2: Chair or W/C > Bed: Max A (lift and lower assist), 3: Chair or W/C > Bed: Mod A (lift or lower assist), 3: Bed > Chair or W/C: Mod A (lift or lower assist), 2: Bed > Chair or W/C: Max A (lift and lower assist)  FIM - Locomotion: Wheelchair Distance: 100 Locomotion: Wheelchair: 0: Activity did not occur FIM - Locomotion: Ambulation Locomotion: Ambulation Assistive Devices: Orthosis, Other (comment) (R Swedish knee cage; L hinged knee brace) Ambulation/Gait Assistance: Not tested (comment) Locomotion: Ambulation: 0: Activity did not occur  Comprehension Comprehension Mode: Auditory Comprehension: 5-Follows basic conversation/direction: With extra time/assistive device  Expression Expression Mode: Verbal Expression: 5-Expresses basic 90% of the time/requires cueing < 10% of the time.  Social Interaction Social Interaction Mode: Asleep Social Interaction: 5-Interacts appropriately 90% of the time - Needs monitoring or encouragement for participation or interaction.  Problem Solving Problem Solving: 4-Solves basic 75 - 89% of the time/requires cueing 10 - 24% of the time  Memory Memory: 4-Recognizes or recalls 75 - 89% of the time/requires cueing 10 - 24% of the time  Medical Problem List and Plan: 1. Functional deficits secondary to right BG/IC hemorrhage with left hemiparesis and hemisensory loss, Check follow up CT head and then will need Neuro and cardiology to weigh in on resuming Eliquis vs another agent    2.  DVT  Prophylaxis/Anticoagulation: Continue SCDs. Add TEDs. Monitor for any signs of DVT. 3. Pain Management/chronic back pain: Tylenol as needed for pain. Add voltaren gel. Continue Zanaflex 8 mg twice a day 4. Dysphagia. Dysphagia 3 nectar liquids. Adv per slp 5. Neuropsych: This patient is capable of making decisions on her own behalf. 6. Skin/Wound Care: Routine skin checks 7. Fluids/Electrolytes/Nutrition: improved intake. Off ivf. bmet stable but had CKD 8. Hypertension/CV. Monitor every 8 hours. Continue Coreg 12.5 mg twice a day, Aldactone 25 mg daily, Demadex as directed, Zaroxolyn 2.5 mg Monday Wednesday Friday.   -continue to hold diuretics except for aldactone  -potassium  supplementation    -po intake much improved  -appreciate cards follow up  -follow up bmet overall improved 9. Atrial fibrillation/status post Maze procedure June 2014: Eliquis on hold secondary to hemorrhage. Continue Lanoxin 0.25 mg every other day. Monitor HR tid.   10. Hyperlipidemia. Lipitor 11. History of gouty arthritis: held colchicine due to diarrhea . 12. COPD: Respiratory status stable. Continue Symbicort twice daily. 13. H/o  Peripheral edema with weeping:   Low salt diet.     14. Constipation: ----improved   LOS (Days) 16 A FACE TO FACE EVALUATION WAS PERFORMED  Adar Rase E 04/29/2014, 7:36 AM

## 2014-04-29 NOTE — Progress Notes (Signed)
Orthopedic Tech Progress Note Patient Details:  [name] [birth year] [id number]  Patient ID: [name], male   DOB: 06/26/1950, 64 y.o.   MRN: [id number] Called in advanced brace order; spoke with Jane Canary, Christna Kulick 04/29/2014, 11:24 AM

## 2014-04-29 NOTE — Progress Notes (Addendum)
Physical Therapy Session Note  Patient Details  Name: [name] MRN: [id number] Date of Birth: 1950-12-19  Today's Date: 04/29/2014 PT Individual Time: 1115-1200 PT Individual Time Calculation (min): 45 min   Short Term Goals: Week 2:  PT Short Term Goal 1 (Week 2): Pt will perform supine > sit with mod A with HOB flat using bed rail. PT Short Term Goal 2 (Week 2): Pt will perform sit > supine with HOB flat and mod A. PT Short Term Goal 3 (Week 2): Pt will consistently transfer from bed<>w/c with mod A of single therapist. PT Short Term Goal 4 (Week 2): Pt will perform w/c mobility x75' with supervision and 25% cueing for technique.  Skilled Therapeutic Interventions/Progress Updates:    2:1. Pt received seated in w/c on 1.5 L/min supplemental O2, [place]; agreeable to therapy. Session focused on functional transfers, standing, and pre-gait. Transported pt to gym with total A for energy conservation. In ortho gym, pt performed squat pivot transfer from w/c <> mat table (bilat directions) with mod A (second person close by for safety) with manual stabilization of LUE; when transferring to L side, assist focused on gradingcontrolling movement. Pt performed sit <>stand x3 trials with mod A to control forward momentum; additional person close by for safety). See below for detailed description of NMR interventions performed in standing. Pt performed gait x2 steps forward, 2 steps retro with +2A (pt performed 50%) with 3 musketeers assist to promote upright posture; mirror anterior to pt for visual feedback of posture; manual facilitation provided for lateral weight shift. Pt performed w/c mobility x40' in controlled environment with LLE only (for increased attention to LLE, hamstring activation/strengthening) with min A, tactile cueing for increased weight bearing. Session ended in pt room, where pt was left seated in w/c with lunch tray set up and all needs within reach.  Addendum: Long term goals  addressing car transfer, furniture transfer, and w/c mobility in home/community environments discharged due to change in discharge plan to SNF placement.  Therapy Documentation Precautions:  Precautions Precautions: Fall Restrictions Weight Bearing Restrictions: No Pain: Pain Assessment Pain Assessment: No/denies pain Locomotion : Ambulation Ambulation/Gait Assistance: 1: +2 Total assist Wheelchair Mobility Distance: 40  NMR Neuromuscular Facilitation: Left;Lower Extremity;Activity to increase grading;Activity to increase sustained activation;Activity to increase lateral weight shifting;Activity to increase motor control Standing with +2A (pt performing 75% of static standing, 50% of dynamic standing) with PT positioned under LUE and rehab tech providing min guard for safety, pt utilized RUE to hit golf ball on ground. Activity focused on increasing weightbearing on LLE (via weight shift involved in hitting ball), increasing postural stability/control with dynamic standing, and increasing standing tolerance. See FIM for current functional status  Therapy/Group: Individual Therapy  Kanyon Bunn, Malva Cogan 04/29/2014, 12:28 PM

## 2014-04-29 NOTE — Progress Notes (Signed)
Occupational Therapy Weekly Progress Note  Patient Details  Name: [name] MRN: [id number] Date of Birth: Sep 20, 1950  Beginning of progress report period: April 23, 2014 End of progress report period: April 29, 2014  Today's Date: 04/29/2014 OT Individual Time: 0800-0900 and 4098-1191 OT Individual Time Calculation (min): 60 min and 30 min   Patient has met 2 of 4 short term goals.  Pt is making steady progress towards goals.  He continues to require +2 for transfers for safety due to decreased motor control and impaired LUE proprioception and sensation.  Pt currently requires +2 for standing balance while completing LB dressing and toileting hygiene/clothing management dur to impaired motor control.  Patient continues to demonstrate the following deficits: muscle weakness, decreased oxygen support, impaired timing and sequencing, impaired motor control, muscle activation and decreased motor planning, decreased midline orientation, decreased attention to left, impaired sensation and proprioception on Lt, decreased sitting balance, decreased standing balance, decreased postural control, hemiplegia, decreased balance strategies and therefore will continue to benefit from skilled OT intervention to enhance overall performance with BADL and Reduce care partner burden.  Patient progressing toward long term goals..  Continue plan of care.  OT Short Term Goals Week 2:  OT Short Term Goal 1 (Week 2): Pt will complete bathing at sit <> stand level with max assist of 1 person OT Short Term Goal 1 - Progress (Week 2): Progressing toward goal OT Short Term Goal 2 (Week 2): Pt will complete UB dressing with min assist while seated unsupported OT Short Term Goal 2 - Progress (Week 2): Met OT Short Term Goal 3 (Week 2): Pt will complete LB dressing at sit <> stand level with max assist of 1 person OT Short Term Goal 3 - Progress (Week 2): Progressing toward goal OT Short Term Goal 4 (Week 2): Pt  will complete toilet transfer to [facility] with mod assist of 1 person OT Short Term Goal 4 - Progress (Week 2): Met Week 3:  OT Short Term Goal 1 (Week 3): Pt will complete bathing at sit <> stand level with max assist of 1 person OT Short Term Goal 2 (Week 3): Pt will complete LB dressing at sit <> stand level with max assist of 1 person OT Short Term Goal 3 (Week 3): Pt will complete UB dressing with supervision OT Short Term Goal 4 (Week 3): Pt will use LUE as gross assist with self-care tasks with min verbal cues  Skilled Therapeutic Interventions/Progress Updates:    1) Engaged in ADL retraining with focus on sit > stand, standing balance, transfers, and functional use of LUE during self-care tasks.  Engaged in bathing and dressing seated at EOB with focus on increased use of LUE during bathing and dressing.  Pt required min verbal cues to visually attend to LUE to increase motor control during bathing.  Pt donned both pant legs in sitting this session, +2 for pulling pants over hips in standing with 2nd person providing steady assist with transition from sit> stand and to maintain upright standing without UE support.  Engaged in blocked practice of sit > stand from EOB to increase motor control and sequencing with sit > stand to promote carryover and increased safety.  Progressing to mod assist sit > stand of one by end of session (1x).  Squat pivot transfer to w/c with wrist weight on Lt wrist to increase proprioception to increase safety of UE during transfer.  Mod assist of one caregiver with pt demonstrating recall of  sequencing and body positioning prior to and during transfer.    2) Focus on toilet transfers to promote carryover from previous sessions and promote pt to direct his care as he transitions to next level of care.  Completed w/c to drop arm BSC transfer to pt's Rt with focus on proper positioning of each, parts management, and transfer technique.  +2 for safety due to pt's inconsistency  with motor control.  Completed squat pivot transfer with mod assist with tactile cues for anterior weight shift and motor control.  Pt reports need to toilet, required +2 with therapist providing multimodal cues for standing balance while RN assisted with clothing and hygiene.  Post toileting pt returned to w/c with mod assist squat pivot and cues for LUE placement to improve safety due to decreased proprioception and sensation.    Therapy Documentation Precautions:  Precautions Precautions: Fall Restrictions Weight Bearing Restrictions: No General:   Vital Signs: Therapy Vitals Temp Source: Oral Pulse Rate: 64 Resp: 18 BP: (!) 138/50 mmHg Patient Position (if appropriate): Lying Oxygen Therapy SpO2: 98 % O2 Device: Nasal Cannula O2 Flow Rate (L/min): 1.5 L/min Pain:  Pt with no c/o pain  See FIM for current functional status  Therapy/Group: Individual Therapy  Simonne Come 04/29/2014, 7:17 AM

## 2014-04-29 NOTE — Patient Care Conference (Signed)
Inpatient RehabilitationTeam Conference and Plan of Care Update Date: 04/29/2014   Time: 10:30 AM    Patient Name: [name]      Medical Record Number: [id number]  Date of Birth: [birth year] Sex: Male         Room/Bed: 4M07C/4M07C-01 Payor Info: Payor: /    Admitting Diagnosis: R BG ICH  Admit Date/Time:  04/13/2014  4:21 PM Admission Comments: No comment available   Primary Diagnosis:  Nontraumatic intracerebral hemorrhage Principal Problem: Nontraumatic intracerebral hemorrhage  Patient Active Problem List   Diagnosis Date Noted  . Systolic CHF, chronic   . AKI (acute kidney injury)   . Altered sensation due to stroke 04/16/2014  . Acute left hemiparesis 04/13/2014  . Nontraumatic intracerebral hemorrhage 04/07/2014  . Bilateral carotid artery disease 03/04/2014  . Acute cor pulmonale 01/14/2014  . RVF (right ventricular failure) 01/14/2014  . Hypotension 10/23/2013  . Chronic combined systolic and diastolic heart failure 40/98/1191  . Back pain, spinal stenosis 09/30/2013  . Alcohol abuse 09/28/2013  . 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 Chronic anticoagulation 11/13/2012  . Long term (current) use of anticoagulants, Eliquis 10/23/2012  . Chronic a-fib- failed Amio-DCCV  10/23/2012  . Gout flare. 09/29/12, Lt knee, improved with colchicine. 09/30/2012  . Aortic stenosis, severe- Tissue AVR 5/14 09/23/2012  . CAD, CABG X 2 with SVG-PD/PL 5/14 09/23/2012  . Cellulitis, lower extremity- treated-May 2015 09/23/2012  . PAD (peripheral artery disease), decreased bil. ABIs 09/23/2012  . COPD on home oxygen 09/23/2012  . Acute respiratory failure, with BiPAP needed on admit 09/16/2012  . PAF - Maze at the time of his CABG/AVR 5/14. Recurrent a fib On Amiodarone, DCCV this admit with recurrent a fib, now persistent a fib 09/16/2012  . HTN (hypertension) 09/16/2012  . Hyperlipidemia 09/16/2012  . Noncompliance, no meds for two weeks prior to admission 09/16/2012  .  Tobacco use 09/16/2012  . Acute combined systolic and diastolic CHF - EF  47-82% June 2015  09/16/2012  . Bilateral lower extremity edema, secondary to chronic CHF 09/16/2012    Expected Discharge Date: Expected Discharge Date: 05/05/14  Team Members Present: Physician leading conference: Dr. Alysia Penna Social Worker Present: Alfonse Alpers, LCSW Nurse Present: Heather Roberts, RN PT Present: Georjean Mode, PT;Blair Hobble, PT OT Present: Meriel Pica, Jules Schick, OT SLP Present: Windell Moulding, SLP PPS Coordinator present : Daiva Nakayama, RN, CRRN     Current Status/Progress Goal Weekly Team Focus  Medical   still working on urinary inc and off condom cath  maintain med stability  off IVF maintain hydratio off condom cath   Bowel/Bladder   Continent of bowel and bladder; has incontinent episodes of bowel; wears condom cath at night to prevent spills with urinal; BM 1/5- had suppository 1/4  Min assist  Assess for constipation and provide relief if needed.   Swallow/Nutrition/ Hydration   Dys 3, thin liquids, intermittent supervision   Mod I with least restrictive diet   continue towards trials of advanced consistencies, oral motor exercises to improve mastication of regular textures   ADL's   mod assist bahting at bed level (+2 for sit <> stand), min assist UB dressing, total +2 LB dressing, mod assist transfers +2 for safety  min assist overall  LUE NMR, motor control, sit <> stand, transfers   Mobility   Mod A transfers (+2A for safety). R knee pain resolved  Downgraded to Min A overall  bed mobility, transfers, grading of movement, midline orientation,  L NMR, activity tolerance   Communication             Safety/Cognition/ Behavioral Observations  supervision   Mod I   continue working on memory, problem solving, self-monitoring and correcting errors    Pain   C/o pain in left shoulder; right knee; voltaren gel to right knee; muscle rub to left shoulder; ultram po q6h prn   < 4  Assess and treat for pain q shift and prn   Skin   Multiple skin tears to LUE and LLE; tegaderms and foam dressings in place; posey sleeve for protection  No additional skin breakdown or infection with mod assist  Assess skin q shift and prn; change dressings as needed    Rehab Goals Patient on target to meet rehab goals: Yes Rehab Goals Revised: Goals had been downgraded, but will probably be upgraded back to min assist - supervision *See Care Plan and progress notes for long and short-term goals.  Barriers to Discharge: needs 2 person assist at times    Possible Resolutions to Barriers:  cont rehab, swedish knee cage    Discharge Planning/Teaching Needs:  Pt and dtr are working together to arrange a transfer to a SNF at d/c from CIR.  No family education plans due to tx to SNF expected.   Team Discussion:  Pt is doing well medically and is making some progress in the last few days with therapies.  He will have a f/u CT of his brain prior to d/c.  PT wants to try a brace to prevent his knee from hyperextending.  OT reports pt's transfers have been much better and that OT wants to start working on transfer to the [facility].  ST feels that pt's affect is improved and thus his participation and attention has, as well.  Pt has been continent during the day and RN to try pt without the condom cath at night to see how he does.  Revisions to Treatment Plan:  None   Continued Need for Acute Rehabilitation Level of Care: The patient requires daily medical management by a physician with specialized training in physical medicine and rehabilitation for the following conditions: Daily direction of a multidisciplinary physical rehabilitation program to ensure safe treatment while eliciting the highest outcome that is of practical value to the patient.: Yes Daily medical management of patient stability for increased activity during participation in an intensive rehabilitation regime.: Yes Daily analysis  of laboratory values and/or radiology reports with any subsequent need for medication adjustment of medical intervention for : Neurological problems;Other  Loriann Bosserman, Silvestre Mesi 04/29/2014, 2:28 PM

## 2014-04-30 ENCOUNTER — Inpatient Hospital Stay (HOSPITAL_COMMUNITY): Payer: Self-pay | Admitting: Speech Pathology

## 2014-04-30 ENCOUNTER — Ambulatory Visit: Payer: Self-pay | Admitting: Pulmonary Disease

## 2014-04-30 ENCOUNTER — Inpatient Hospital Stay (HOSPITAL_COMMUNITY): Payer: BLUE CROSS/BLUE SHIELD | Admitting: Occupational Therapy

## 2014-04-30 DIAGNOSIS — G8190 Hemiplegia, unspecified affecting unspecified side: Secondary | ICD-10-CM

## 2014-04-30 DIAGNOSIS — I480 Paroxysmal atrial fibrillation: Secondary | ICD-10-CM

## 2014-04-30 DIAGNOSIS — I5022 Chronic systolic (congestive) heart failure: Secondary | ICD-10-CM

## 2014-04-30 DIAGNOSIS — R208 Other disturbances of skin sensation: Secondary | ICD-10-CM

## 2014-04-30 DIAGNOSIS — N179 Acute kidney failure, unspecified: Secondary | ICD-10-CM

## 2014-04-30 DIAGNOSIS — I69398 Other sequelae of cerebral infarction: Secondary | ICD-10-CM

## 2014-04-30 DIAGNOSIS — I610 Nontraumatic intracerebral hemorrhage in hemisphere, subcortical: Secondary | ICD-10-CM

## 2014-04-30 LAB — DIGOXIN LEVEL: Digoxin Level: 0.4 ng/mL — ABNORMAL LOW (ref 0.8–2.0)

## 2014-04-30 NOTE — Progress Notes (Signed)
Social Work Patient ID: [name], male   DOB: 1950-08-22, 64 y.o.   MRN: [id number]   CSW met with pt and spoke with his dtr via telephone to update them on the team conference discussion.  Both of them are in agreement for plan to transfer to SNF after CIR goals are met.  Pt is deferring to his dtr to make the decision of which SNF he should go to.   Pt and dtr are both very appreciative of CIR care and CSW involvement.    CSW will begin the search now for a SNF bed for transfer on 05-05-14.

## 2014-04-30 NOTE — Progress Notes (Signed)
Physical Therapy Session Note  Patient Details  Name: [name] MRN: [id number] Date of Birth: 08/11/1950  Today's Date: 04/30/2014 PT Co-Treatment Time: 9977-4142 (Co-tx with [facility] (OT); entire session from 1330-1430) PT Co-Treatment Time Calculation (min): 30 min  Short Term Goals: Week 2:  PT Short Term Goal 1 (Week 2): Pt will perform supine > sit with mod A with HOB flat using bed rail. PT Short Term Goal 2 (Week 2): Pt will perform sit > supine with HOB flat and mod A. PT Short Term Goal 3 (Week 2): Pt will consistently transfer from bed<>w/c with mod A of single therapist. PT Short Term Goal 4 (Week 2): Pt will perform w/c mobility x75' with supervision and 25% cueing for technique.  Skilled Therapeutic Interventions/Progress Updates:    Skilled co-treatment with primary OT focusing on functional transfers, pt directing care in preparation for transition to next venue of care. Pt performed multiple squat pivot transfers from bed<>w/c<>bedside commode with +2A initially (for stabilization of w/c, grading/control of movement) but transitioned to min-mod A within this session with blocked practice. Pt initially required >75% cueing for transfer setup, w/c parts management, and direction of care and progressed to requiring only 25% cueing with final transfer. Safety and independence with all transfers continues to be limited by LUE inattention. Departed with pt seated in w/c with all needs within reach.  Therapy Documentation Precautions:  Precautions Precautions: Fall Restrictions Weight Bearing Restrictions: No Vital Signs: Therapy Vitals Temp: 97.7 F (36.5 C) Temp Source: Oral Pulse Rate: 63 Resp: 18 BP: 101/86 mmHg Patient Position (if appropriate): Sitting Oxygen Therapy SpO2: 96 % O2 Device: Nasal Cannula O2 Flow Rate (L/min): 1.5 L/min Pain: Pain Assessment Pain Assessment: No/denies pain  See FIM for current functional status  Therapy/Group:  Co-Treatment  Hobble, Blair A 04/30/2014, 4:19 PM

## 2014-04-30 NOTE — Progress Notes (Signed)
Occupational Therapy Session Note  Patient Details  Name: CLAVIN RUHLMAN MRN: [id number] Date of Birth: July 18, 1950  Today's Date: 04/30/2014 OT Co-Treatment Time: 1330-1400 (co-tx with PT full time 1330-1430) OT Co-Treatment Time Calculation (min): 30 min   Short Term Goals: Week 3:  OT Short Term Goal 1 (Week 3): Pt will complete bathing at sit <> stand level with max assist of 1 person OT Short Term Goal 2 (Week 3): Pt will complete LB dressing at sit <> stand level with max assist of 1 person OT Short Term Goal 3 (Week 3): Pt will complete UB dressing with supervision OT Short Term Goal 4 (Week 3): Pt will use LUE as gross assist with self-care tasks with min verbal cues  Skilled Therapeutic Interventions/Progress Updates:    Engaged in skilled co-treat with primary PT with focus on carryover of education with various transfers to promote ability to direct his own care when transferred to next venue of care as pt reports this is his biggest concern.  Pt donned shorts with threading BLE and standing with max assist of 1 therapist this session, to achieve and maintain midline standing, while he pulled up Rt side and therapist pulled up Lt. 2nd therapist providing question cues to promote awareness of midline in standing.  Engaged in squat pivot transfers bed <> w/c and bed <> drop arm BSC with increased focus on pt verbalizing sequence of transfer and how to assist him with transfer.  Pt required +2 initially with transfer due to decreased motor control and quick movements with 2nd person for safety and to stabilize w/c, progressed to mod assist approaching min assist with repetition.  Pt continues to require physical assist to manage LUE during transfers, providing weight bearing through UE to ensure proper placement.   Therapy Documentation Precautions:  Precautions Precautions: Fall Restrictions Weight Bearing Restrictions: No General:   Vital Signs: Therapy Vitals Pulse Rate:  63 Pain:  Pt with no c/o pain  See FIM for current functional status  Therapy/Group: Co-Treatment  Jaliah Foody 04/30/2014, 3:19 PM

## 2014-04-30 NOTE — Progress Notes (Signed)
Occupational Therapy Session Note  Patient Details  Name: NIMESH RIOLO MRN: [id number] Date of Birth: 1950/06/06  Today's Date: 04/30/2014 OT Individual Time: 0902-1002 OT Individual Time Calculation (min): 60 min    Short Term Goals: Week 3:  OT Short Term Goal 1 (Week 3): Pt will complete bathing at sit <> stand level with max assist of 1 person OT Short Term Goal 2 (Week 3): Pt will complete LB dressing at sit <> stand level with max assist of 1 person OT Short Term Goal 3 (Week 3): Pt will complete UB dressing with supervision OT Short Term Goal 4 (Week 3): Pt will use LUE as gross assist with self-care tasks with min verbal cues  Skilled Therapeutic Interventions/Progress Updates:    1) Engaged in ADL retraining with focus on transfers, and functional use of LUE during self-care tasks.  LB bathing completed at bed level and donned TEDS prior to sitting EOB due to noted increase in swelling in LLE (notified RN).  Incorporated LUE into washing with obtaining items on Lt.  Pt donned shirt with setup assist this session.  No clean shorts, therefore was unable to complete LB dressing this session.  Applied wrist weight to Lt wrist to increase proprioception in LUE during transfer.  Squat pivot transfer to Lt bed > w/c with mod assist with WB through LUE to maintain safe positioning.   Therapy Documentation Precautions:  Precautions Precautions: Fall Restrictions Weight Bearing Restrictions: No General:   Vital Signs: Oxygen Therapy SpO2: 100 % O2 Device: Nasal Cannula O2 Flow Rate (L/min): 1.5 L/min Pain:  Pt with no c/o pain  See FIM for current functional status  Therapy/Group: Individual Therapy  Simonne Come 04/30/2014, 10:24 AM

## 2014-04-30 NOTE — Progress Notes (Signed)
Speech Language Pathology Daily Session Note  Patient Details  Name: [name] MRN: [id number] Date of Birth: 07-Mar-1951  Today's Date: 04/30/2014 SLP Individual Time: 6378-5885 SLP Individual Time Calculation (min): 60 min  Short Term Goals: Week 3: SLP Short Term Goal 1 (Week 3): Pt will demonstrate improved mastication for toleration of regular textures with Mod I  SLP Short Term Goal 2 (Week 3): Pt will complete oral motor exercises targeting left sided weakness to improve mastication and manipulation of regular solid boluses with supervision.  SLP Short Term Goal 3 (Week 3): Pt will improve recall of semi-complex information via use of compensatory strategies over 80% of observable opportunities with supervision cues.  SLP Short Term Goal 4 (Week 3): Pt will improve functional problem solving skills (i.e. planning, thought organization, and error awareness) during complex tasks over 80% of observable opportunities with supervision cues.   Skilled Therapeutic Interventions:  Pt was seen for skilled ST targeting cognitive goals.  Upon arrival, pt was seated upright in wheelchair, awake,alert, and agreeable to participate in therapy.  SLP facilitated the session with supervision question cues for recall of at least 3 details from a previously targeted therapeutic activity which was completed >24 hours ago.  SLP then provided skilled education related to activities for cognitive reorganization to facilitate carryover of targeted cognitive skills in between therapy sessions.  Pt reports that he used to enjoy reading, but that he has not attempted reading since his CVA.  SLP completed skilled observations while pt read aloud from a personal novel and exhibited grossly intact reading fluency and comprehension abilities.  SLP encouraged pt to read in between therapy sessions for continued cognitive remediation/compensation for attention and recall of information in between therapy sessions.  SLP  further facilitated the session with a structured new learning activity targeting use of associations as a compensatory aid for memory.  Pt required mod cues for making word-category associations but was then overall min assist-supervision for recall of categories during the activity.  SLP will continue activity into next therapy session to target delayed recall of categories and mental flexibility for generative naming within categories.  Pt continues to make good progress towards meeting goals.  Continue per current plan of care.    FIM:  Comprehension Comprehension Mode: Auditory Comprehension: 5-Follows basic conversation/direction: With extra time/assistive device Expression Expression Mode: Verbal Expression: 5-Expresses basic needs/ideas: With no assist Social Interaction Social Interaction: 5-Interacts appropriately 90% of the time - Needs monitoring or encouragement for participation or interaction. Problem Solving Problem Solving: 5-Solves basic 90% of the time/requires cueing < 10% of the time Memory Memory: 4-Recognizes or recalls 75 - 89% of the time/requires cueing 10 - 24% of the time  Pain Pain Assessment Pain Assessment: No/denies pain  Therapy/Group: Individual Therapy  Maruice Pieroni, Selinda Orion 04/30/2014, 3:57 PM

## 2014-04-30 NOTE — Progress Notes (Signed)
64 y.o. right handed male history of HTN, COPD-oxygen dependent, A fib on Eliquis, s/p MAZE June 2014. He was independent prior to admission, lives alone and was using a walker due to back pain.  He was admitted on 04/07/2014 past  with left-sided weakness and mild slurred speech. CCT showed a right IC basal ganglia hemorrhage with a 4 mm right to left midline shift and BP 158/85 at admission. Eliquis was  reversed with Kcentra and BP monitored closely. Recommendations to repeat CCT in 3-4 weeks to help determine ability to resume Eliquis.  Repeat carotid dopplers done revealing 60- 79% L-ICA and 40- 59% R-ICA stenosis   Subjective/Complaints: Still with no feeling in LUE or LLE  Review of Systems - mild pain  Objective: Vital Signs: Blood pressure 113/87, pulse 61, temperature 98 F (36.7 C), temperature source Oral, resp. rate 18, height '5\' 10"'  (1.778 m), weight 93.8 kg (206 lb 12.7 oz), SpO2 100 %. No results found. Results for orders placed or performed during the hospital encounter of 04/13/14 (from the past 72 hour(s))  Basic metabolic panel     Status: Abnormal   Collection Time: 04/27/14  8:20 AM  Result Value Ref Range   Sodium 135 135 - 145 mmol/L    Comment: Please note change in reference range.   Potassium 5.0 3.5 - 5.1 mmol/L    Comment: Please note change in reference range.   Chloride 102 96 - 112 mEq/L   CO2 27 19 - 32 mmol/L   Glucose, Bld 149 (H) 70 - 99 mg/dL   BUN 32 (H) 6 - 23 mg/dL   Creatinine, Ser 1.40 (H) 0.50 - 1.35 mg/dL   Calcium 8.6 8.4 - 10.5 mg/dL   GFR calc non Af Amer 52 (L) >90 mL/min   GFR calc Af Amer 60 (L) >90 mL/min    Comment: (NOTE) The eGFR has been calculated using the CKD EPI equation. This calculation has not been validated in all clinical situations. eGFR's persistently <90 mL/min signify possible Chronic Kidney Disease.    Anion gap 6 5 - 15     HEENT: normal and O2 [place] Cardio: RRR and murmur Resp: CTA B/L and unlabored, no rales  or wheezes GI: BS positive and NT, ND Extremity:  No Edema Skin:   Bruise multiple eccymotic areas in BUE and BLE Neuro: Alert/Oriented, Cranial Nerve II-XII normal, Abnormal Sensory reduced on Left, Abnormal Motor 3/5 LUE, 3- Left HF, 4/5 L KE, 3- L ankle DF and Abnormal FMC Ataxic/ dec FMC, sensory absent  Musc/Skel:    No swelling or pain with AROM Right knee.   Gen NAD Psych: pleasant and appropriate. Reduced insight   Assessment/Plan: 1. Functional deficits secondary to right BG/IC hemorrhage with left hemiparesis and hemisensory loss which require 3+ hours per day of interdisciplinary therapy in a comprehensive inpatient rehab setting. Physiatrist is providing close team supervision and 24 hour management of active medical problems listed below. Physiatrist and rehab team continue to assess barriers to discharge/monitor patient progress toward functional and medical goals. Making progress, d/c dispo is SNF, will need CT head prior to d/c  FIM: FIM - Bathing Bathing Steps Patient Completed: Chest, Right Arm, Left Arm, Abdomen, Right upper leg, Left upper leg, Front perineal area Bathing: 3: Mod-Patient completes 5-7 44f10 parts or 50-74%  FIM - Upper Body Dressing/Undressing Upper body dressing/undressing steps patient completed: Thread/unthread right sleeve of pullover shirt/dresss, Thread/unthread left sleeve of pullover shirt/dress, Put head through opening of  pull over shirt/dress, Pull shirt over trunk Upper body dressing/undressing: 5: Set-up assist to: Obtain clothing/put away FIM - Lower Body Dressing/Undressing Lower body dressing/undressing steps patient completed: Thread/unthread right pants leg, Thread/unthread left pants leg Lower body dressing/undressing: 1: Two helpers  FIM - Musician Devices: Grab bar or rail for support Toileting: 1: Two helpers  FIM - Radio producer Devices: Recruitment consultant Transfers: 3-To  toilet/BSC: Mod A (lift or lower assist), 3-From toilet/BSC: Mod A (lift or lower assist)  FIM - Control and instrumentation engineer Devices: Arm rests, Orthosis Bed/Chair Transfer: 3: Bed > Chair or W/C: Mod A (lift or lower assist), 3: Chair or W/C > Bed: Mod A (lift or lower assist)  FIM - Locomotion: Wheelchair Distance: 40 Locomotion: Wheelchair: 1: Travels less than 50 ft with minimal assistance (Pt.>75%) FIM - Locomotion: Ambulation Locomotion: Ambulation Assistive Devices: Orthosis, Other (comment) (bilat knee orthoses) Ambulation/Gait Assistance: 1: +2 Total assist Locomotion: Ambulation: 1: Two helpers  Comprehension Comprehension Mode: Auditory Comprehension: 5-Follows basic conversation/direction: With extra time/assistive device  Expression Expression Mode: Verbal Expression: 5-Expresses basic needs/ideas: With no assist  Social Interaction Social Interaction Mode: Asleep Social Interaction: 5-Interacts appropriately 90% of the time - Needs monitoring or encouragement for participation or interaction.  Problem Solving Problem Solving: 5-Solves basic 90% of the time/requires cueing < 10% of the time  Memory Memory: 4-Recognizes or recalls 75 - 89% of the time/requires cueing 10 - 24% of the time  Medical Problem List and Plan: 1. Functional deficits secondary to right BG/IC hemorrhage with left hemiparesis and hemisensory loss, Check follow up CT head and then will need Neuro and cardiology to weigh in on resuming Eliquis vs another agent    2.  DVT Prophylaxis/Anticoagulation: Continue SCDs. Add TEDs. Monitor for any signs of DVT. 3. Pain Management/chronic back pain: Tylenol as needed for pain. Add voltaren gel. Continue Zanaflex 8 mg twice a day 4. Dysphagia. Dysphagia 3 nectar liquids. Adv per slp 5. Neuropsych: This patient is capable of making decisions on her own behalf. 6. Skin/Wound Care: Routine skin checks 7. Fluids/Electrolytes/Nutrition:  improved intake. Off ivf. bmet stable but had CKD 8. Hypertension/CV. Monitor every 8 hours. Continue Coreg 12.5 mg twice a day, Aldactone 25 mg daily, Demadex as directed, Zaroxolyn 2.5 mg Monday Wednesday Friday.   -continue to hold diuretics except for aldactone  -potassium  supplementation    -po intake much improved  -appreciate cards follow up  -follow up bmet overall improved 9. Atrial fibrillation/status post Maze procedure June 2014: Eliquis on hold secondary to hemorrhage. Continue Lanoxin 0.25 mg every other day. Monitor HR tid.   10. Hyperlipidemia. Lipitor 11. History of gouty arthritis: held colchicine due to diarrhea . 12. COPD: Respiratory status stable. Continue Symbicort twice daily. 13. H/o  Peripheral edema with weeping:   Low salt diet.     14. Constipation: ----improved   LOS (Days) 17 A FACE TO FACE EVALUATION WAS PERFORMED  Tadeo Besecker E 04/30/2014, 6:53 AM

## 2014-04-30 NOTE — Plan of Care (Signed)
Problem: RH Bathing Goal: LTG Patient will bathe with assist, cues/equipment (OT) LTG: Patient will bathe specified number of body parts with assist with/without cues using equipment (position) (OT)  Modified due to may require lateral leans for LB bathing  Problem: RH Dressing Goal: LTG Patient will perform lower body dressing w/assist (OT) LTG: Patient will perform lower body dressing with assist, with/without cues in positioning using equipment (OT)  Modified due to may require lateral leans for LB dressing vs standing  Problem: RH Toileting Goal: LTG Patient will perform toileting w/assist, cues/equip (OT) LTG: Patient will perform toiletiing (clothes management/hygiene) with assist, with/without cues using equipment (OT)  Downgraded due to decreased motor control in standing, requiring increased assist to maintain standing balance as well as assist with clothing/hygiene

## 2014-05-01 ENCOUNTER — Inpatient Hospital Stay (HOSPITAL_COMMUNITY): Payer: BLUE CROSS/BLUE SHIELD | Admitting: Speech Pathology

## 2014-05-01 ENCOUNTER — Inpatient Hospital Stay (HOSPITAL_COMMUNITY): Payer: BLUE CROSS/BLUE SHIELD | Admitting: Occupational Therapy

## 2014-05-01 ENCOUNTER — Inpatient Hospital Stay (HOSPITAL_COMMUNITY): Payer: BLUE CROSS/BLUE SHIELD | Admitting: Physical Therapy

## 2014-05-01 DIAGNOSIS — I69398 Other sequelae of cerebral infarction: Secondary | ICD-10-CM

## 2014-05-01 DIAGNOSIS — R269 Unspecified abnormalities of gait and mobility: Secondary | ICD-10-CM

## 2014-05-01 MED ORDER — BOOST / RESOURCE BREEZE PO LIQD
1.0000 | Freq: Two times a day (BID) | ORAL | Status: DC
  Administered 2014-05-01 – 2014-05-05 (×7): 1 via ORAL

## 2014-05-01 NOTE — Progress Notes (Signed)
NUTRITION FOLLOW UP  INTERVENTION: Provide Resource Breeze po BID, each supplement provides 250 kcal and 9 grams of protein.  Provide Ensure Complete po once daily, each supplement provides 350 kcal and 13 grams of protein.  Provide 30 ml Prostat po once daily, each supplement provides 100 kcal and 15 grams of protein.  Encourage adequate PO intake.   NUTRITION DIAGNOSIS: Inadequate oral intake related to decreased appetite as evidenced by varied meal completion of 30-100%; improving  Goal: Pt to meet >/= 90% of their estimated nutrition needs; met  Monitor:  PO intake, weight trends, labs, I/O's  64 y.o. male  Admitting Dx: Acute left hemiparesis  ASSESSMENT: Pt with history of HTN, COPD-oxygen dependent, A fib on Eliquis, s/p MAZE June 2014. He was admitted on 04/07/2014 past with left-sided weakness and mild slurred speech. CCT showed a right IC basal ganglia hemorrhage with a 4 mm right to left midline shift.  Pt reports having a good appetite currently. Meal completion has varied from 50-100%. Pt has been taking his supplements, however reports he is receiving too many [employer]. Will modify orders. Pt was encouraged to eat his food at meals and to drink his supplements. Noted weight has been trending down. Will monitor closely.  Labs and medications reviewed.  Height: Ht Readings from Last 1 Encounters:  04/13/14 _0  (1.778 m)    Weight: Wt Readings from Last 1 Encounters:  05/01/14 205 lb 7.5 oz (93.2 kg)    BMI:  Body mass index is 29.48 kg/(m^2).   Re-Estimated Nutritional Needs: Kcal: 2000-2300 Protein: 95-110 grams Fluid: 2 - 2.3L/day  Skin: puncture left hand, wound on left leg, +2 RLE edema  Diet Order: DIET DYS 3   Intake/Output Summary (Last 24 hours) at 05/01/14 1422 Last data filed at 05/01/14 1300  Gross per 24 hour  Intake    960 ml  Output    500 ml  Net    460 ml    Last BM: 1/6  Labs:   Recent Labs Lab 04/26/14 0705  04/27/14 0820  NA 132* 135  K 4.6 5.0  CL 99 102  CO2 29 27  BUN 34* 32*  CREATININE 1.31 1.40*  CALCIUM 8.2* 8.6  GLUCOSE 131* 149*    CBG (last 3)  No results for input(s): GLUCAP in the last 72 hours.  Scheduled Meds: . antiseptic oral rinse  7 mL Mouth Rinse BID  . atorvastatin  80 mg Oral q1800  . budesonide-formoterol  2 puff Inhalation BID  . carvedilol  12.5 mg Oral BID WC  . diclofenac sodium  2 g Topical QID  . digoxin  0.125 mg Oral Q48H  . feeding supplement (ENSURE COMPLETE)  237 mL Oral Q1400  . feeding supplement (PRO-STAT SUGAR FREE 64)  30 mL Oral Daily  . feeding supplement (RESOURCE BREEZE)  1 Container Oral TID BM  . mirtazapine  7.5 mg Oral QHS  . MUSCLE RUB   Topical TID WC  . pantoprazole  40 mg Oral Daily  . polyethylene glycol  17 g Oral QODAY  . senna-docusate  2 tablet Oral QHS  . spironolactone  12.5 mg Oral Daily  . tiZANidine  4 mg Oral TID    Continuous Infusions:   Past Medical History  Diagnosis Date  . Hypertension   . Gout   . Aortic stenosis     echo 06/19/08 with nomal LV function, moderate concentric LVH moderate aortic stenosis area 0.99 cm squared, peak gradient of  50 and mean of 31  . Hyperlipidemia   . Atrial fibrillation with RVR, was in atrial fib in 04/2011 09/16/2012    a) s/p MAZE (09/2012)   . Tobacco use 09/16/2012  . Heart murmur   . Chronic combined systolic and diastolic CHF (congestive heart failure) 09/16/2012    a) ECHO (08/2013) EF 50-55%, grade II DD b) TEE ECHO (09/2013): 40-45%, bioprosthesis present, mild MR  . Aortic stenosis, severe 09/23/2012  . CAD (coronary artery disease), single vessel disease 09/23/2012    a) CABG (SVG to PDA and PL) wtih AVR (09/2012)   . PAD (peripheral artery disease), decreased bil. ABIs 09/23/2012    a) ABIs (11/2013): Right mild arterial insufficiency, L normal; aroto-bifem bypass graft: not well visualized, bilateral SFAs = to greater than 50% in dimaeter reduction   . Gout flare. 09/29/12,  Lt knee, improved with colchicine. 09/30/2012  . H/O aortic valve replacement   . AS (aortic stenosis) with AVR with pericardial tissue valve  09/30/2013  . Back pain, spinal stenosis 09/30/2013  . COPD (chronic obstructive pulmonary disease)   . Bilateral carotid artery disease     Past Surgical History  Procedure Laterality Date  . Iliac artery stent  10/20/08    stent to lt iliac  . Aorto bifem bypass  12/18/08    by Dr. Trula Slade  . Coronary artery bypass graft N/A 10/08/2012    Procedure: CORONARY ARTERY BYPASS GRAFTING (CABG);  Surgeon: Grace Isaac, MD;  Location: [place];  Service: Open Heart Surgery;  Laterality: N/A;  Coronary Artery bypass Graft times two utilizing the left greater saphenous vein harvested endoscopically  . Aortic valve replacement N/A 10/08/2012    Procedure: AORTIC VALVE REPLACEMENT (AVR);  Surgeon: Grace Isaac, MD;  Location: [place];  Service: Open Heart Surgery;  Laterality: N/A;  . Maze N/A 10/08/2012    Procedure: MAZE;  Surgeon: Grace Isaac, MD;  Location: [place];  Service: Open Heart Surgery;  Laterality: N/A;  . Intraoperative transesophageal echocardiogram N/A 10/08/2012    Procedure: INTRAOPERATIVE TRANSESOPHAGEAL ECHOCARDIOGRAM;  Surgeon: Grace Isaac, MD;  Location: [place];  Service: Open Heart Surgery;  Laterality: N/A;  . Endovein harvest of greater saphenous vein Bilateral 10/08/2012    Procedure: ENDOVEIN HARVEST OF GREATER SAPHENOUS VEIN;  Surgeon: Grace Isaac, MD;  Location: [place];  Service: Open Heart Surgery;  Laterality: Bilateral;  . US echocardiography  06/20/2011    mod concentric LVH,LA severely dilated,RA mildly dilated,mod. ca+ of the mitral apparatus,trace MR,mod. ca+ AOV w/stenosis.  Marland Kitchen Nm myocar perf wall motion  08/25/2008    normal  . Cardioversion N/A 02/25/2013    Procedure: CARDIOVERSION;  Surgeon: Sanda Klein, MD;  Location: [place];  Service: Cardiovascular;  Laterality: N/A;  . Tee without cardioversion N/A  09/25/2013    Procedure: TRANSESOPHAGEAL ECHOCARDIOGRAM (TEE);  Surgeon: Thayer Headings, MD;  Location: [place];  Service: Cardiovascular;  Laterality: N/A;  . Cardioversion N/A 09/25/2013    Procedure: CARDIOVERSION;  Surgeon: Thayer Headings, MD;  Location: [facility] ENDOSCOPY;  Service: Cardiovascular;  Laterality: N/A;  . Left and right heart catheterization with coronary angiogram N/A 09/20/2012    Procedure: LEFT AND RIGHT HEART CATHETERIZATION WITH CORONARY ANGIOGRAM;  Surgeon: Lorretta Harp, MD;  Location: [facility] CATH LAB;  Service: Cardiovascular;  Laterality: N/A;    Kallie Locks, MS, RD, LDN Pager # [phone] After hours/ weekend pager # [phone]

## 2014-05-01 NOTE — Progress Notes (Signed)
64 y.o. right handed male history of HTN, COPD-oxygen dependent, A fib on Eliquis, s/p MAZE June 2014. He was independent prior to admission, lives alone and was using a walker due to back pain.  He was admitted on 04/07/2014 past  with left-sided weakness and mild slurred speech. CCT showed a right IC basal ganglia hemorrhage with a 4 mm right to left midline shift and BP 158/85 at admission. Eliquis was  reversed with Kcentra and BP monitored closely. Recommendations to repeat CCT in 3-4 weeks to help determine ability to resume Eliquis.  Repeat carotid dopplers done revealing 60- 79% L-ICA and 40- 59% R-ICA stenosis   Subjective/Complaints: No SOB or CP concerned about swelling in Left foot  Review of Systems - otherwise negative  Objective: Vital Signs: Blood pressure 123/45, pulse 63, temperature 98.2 F (36.8 C), temperature source Oral, resp. rate 17, height 5\' 10"  (1.778 m), weight 93.2 kg (205 lb 7.5 oz), SpO2 97 %. No results found. Results for orders placed or performed during the hospital encounter of 04/13/14 (from the past 72 hour(s))  Digoxin level     Status: Abnormal   Collection Time: 04/30/14  9:04 AM  Result Value Ref Range   Digoxin Level 0.4 (L) 0.8 - 2.0 ng/mL     HEENT: normal and O2 [name]: RRR and murmur Resp: CTA B/L and unlabored, no rales or wheezes GI: BS positive and NT, ND Extremity:  1+ pedal edema on left side only Skin:   Bruise multiple eccymotic areas in LUE and BLE Neuro: Alert/Oriented, Cranial Nerve II-XII normal, Abnormal Sensory reduced on Left, Abnormal Motor 3/5 LUE, 3- Left HF, 4/5 L KE, 3- L ankle DF and Abnormal FMC Ataxic on left side/ dec FMC, sensory absent on left side Musc/Skel:    No swelling or pain with AROM Right knee.   Gen NAD Psych: pleasant and appropriate.    Assessment/Plan: 1. Functional deficits secondary to right BG/IC hemorrhage with left hemiparesis and hemisensory loss which require 3+ hours per day of  interdisciplinary therapy in a comprehensive inpatient rehab setting. Physiatrist is providing close team supervision and 24 hour management of active medical problems listed below. Physiatrist and rehab team continue to assess barriers to discharge/monitor patient progress toward functional and medical goals.  FIM: FIM - Bathing Bathing Steps Patient Completed: Chest, Right Arm, Left Arm, Abdomen, Right upper leg, Left upper leg, Front perineal area Bathing: 3: Mod-Patient completes 5-7 42f 10 parts or 50-74%  FIM - Upper Body Dressing/Undressing Upper body dressing/undressing steps patient completed: Thread/unthread right sleeve of pullover shirt/dresss, Thread/unthread left sleeve of pullover shirt/dress, Put head through opening of pull over shirt/dress, Pull shirt over trunk Upper body dressing/undressing: 5: Set-up assist to: Obtain clothing/put away FIM - Lower Body Dressing/Undressing Lower body dressing/undressing steps patient completed: Thread/unthread right pants leg, Thread/unthread left pants leg Lower body dressing/undressing: 1: Two helpers  FIM - Musician Devices: Grab bar or rail for support Toileting: 1: Two helpers  FIM - Radio producer Devices: Recruitment consultant Transfers: 3-To toilet/BSC: Mod A (lift or lower assist), 3-From toilet/BSC: Mod A (lift or lower assist)  FIM - Control and instrumentation engineer Devices: Arm rests, Orthosis Bed/Chair Transfer: 3: Bed > Chair or W/C: Mod A (lift or lower assist), 3: Chair or W/C > Bed: Mod A (lift or lower assist), 1: Two helpers  FIM - Locomotion: Wheelchair Distance: 40 Locomotion: Wheelchair: 0: Activity did not occur FIM - Locomotion:  Ambulation Locomotion: Ambulation Assistive Devices: Orthosis, Other (comment) (bilat knee orthoses) Ambulation/Gait Assistance: 1: +2 Total assist Locomotion: Ambulation: 0: Activity did not  occur  Comprehension Comprehension Mode: Auditory Comprehension: 5-Understands basic 90% of the time/requires cueing < 10% of the time  Expression Expression Mode: Verbal Expression: 5-Expresses basic 90% of the time/requires cueing < 10% of the time.  Social Interaction Social Interaction Mode: Asleep Social Interaction: 5-Interacts appropriately 90% of the time - Needs monitoring or encouragement for participation or interaction.  Problem Solving Problem Solving: 5-Solves basic 90% of the time/requires cueing < 10% of the time  Memory Memory: 4-Recognizes or recalls 75 - 89% of the time/requires cueing 10 - 24% of the time  Medical Problem List and Plan: 1. Functional deficits secondary to right BG/IC hemorrhage with left hemiparesis and hemisensory loss, Check follow up CT head and then will need Neuro and cardiology to weigh in on resuming Eliquis vs another agent    2.  DVT Prophylaxis/Anticoagulation: Continue SCDs. Add TEDs. Monitor for any signs of DVT. 3. Pain Management/chronic back pain: Tylenol as needed for pain. Add voltaren gel. Continue Zanaflex 8 mg twice a day 4. Dysphagia. Dysphagia 3 nectar liquids. Adv per slp 5. Neuropsych: This patient is capable of making decisions on her own behalf. 6. Skin/Wound Care: Routine skin checks 7. Fluids/Electrolytes/Nutrition: improved intake. Off ivf. bmet stable but had CKD 8. Hypertension/CV. Monitor every 8 hours. Continue Coreg 12.5 mg twice a day, Aldactone 25 mg daily, Demadex as directed, Zaroxolyn 2.5 mg Monday Wednesday Friday.   -continue to hold diuretics except for aldactone  -potassium  supplementation    -po intake much improved  -appreciate cards follow up  -follow up bmet overall improved 9. Atrial fibrillation/status post Maze procedure June 2014: Eliquis on hold secondary to hemorrhage. Continue Lanoxin 0.25 mg every other day. Monitor HR tid.   10. Hyperlipidemia. Lipitor 11. History of gouty arthritis: no  active joint involvement held colchicine due to diarrhea . 12. COPD: Respiratory status stable. Continue Symbicort twice daily. 13. H/o  Peripheral edema with weeping:   Low salt diet.     14. Constipation: ----improved   LOS (Days) 18 A FACE TO FACE EVALUATION WAS PERFORMED  [name] E 05/01/2014, 7:12 AM

## 2014-05-01 NOTE — Progress Notes (Signed)
Speech Language Pathology Daily Session Note  Patient Details  Name: [name] MRN: [id number] Date of Birth: 1950-09-21  Today's Date: 05/01/2014 SLP Individual Time: 1400-1500 SLP Individual Time Calculation (min): 60 min  Short Term Goals: Week 3: SLP Short Term Goal 1 (Week 3): Pt will demonstrate improved mastication for toleration of regular textures with Mod I  SLP Short Term Goal 2 (Week 3): Pt will complete oral motor exercises targeting left sided weakness to improve mastication and manipulation of regular solid boluses with supervision.  SLP Short Term Goal 3 (Week 3): Pt will improve recall of semi-complex information via use of compensatory strategies over 80% of observable opportunities with supervision cues.  SLP Short Term Goal 4 (Week 3): Pt will improve functional problem solving skills (i.e. planning, thought organization, and error awareness) during complex tasks over 80% of observable opportunities with supervision cues.   Skilled Therapeutic Interventions: Skilled treatment session focused on addressing cognition goals.  SLP facilitated session with set-up of new learning task that was introduced during yesterday's session.  Patient was able to recall taught memory strategy (association) with Min question cues.  Patient also required Mod question cues to utilize strategy with new information from today's session that had not been previously rehearsed.   Continue with current plan of care.   FIM:  Comprehension Comprehension Mode: Auditory Comprehension: 5-Follows basic conversation/direction: With extra time/assistive device Expression Expression Mode: Verbal Expression: 5-Expresses basic needs/ideas: With no assist Social Interaction Social Interaction: 5-Interacts appropriately 90% of the time - Needs monitoring or encouragement for participation or interaction. Problem Solving Problem Solving: 5-Solves basic 90% of the time/requires cueing < 10% of the  time Memory Memory: 4-Recognizes or recalls 75 - 89% of the time/requires cueing 10 - 24% of the time FIM - Eating Eating Activity: 5: Supervision/cues;5: Set-up assist for open containers;5: Set-up assist for cut food  Pain Pain Assessment Pain Assessment: No/denies pain Pain Score: 4   Therapy/Group: Individual Therapy  Carmelia Roller., CCC-SLP [phone]  [place] 05/01/2014, 4:21 PM

## 2014-05-01 NOTE — Progress Notes (Signed)
Occupational Therapy Session Note  Patient Details  Name: [name] MRN: [id number] Date of Birth: May 28, 1950  Today's Date: 05/01/2014 OT Individual Time: 0940-1040 OT Individual Time Calculation (min): 60 min    Short Term Goals: Week 3:  OT Short Term Goal 1 (Week 3): Pt will complete bathing at sit <> stand level with max assist of 1 person OT Short Term Goal 2 (Week 3): Pt will complete LB dressing at sit <> stand level with max assist of 1 person OT Short Term Goal 3 (Week 3): Pt will complete UB dressing with supervision OT Short Term Goal 4 (Week 3): Pt will use LUE as gross assist with self-care tasks with min verbal cues  Skilled Therapeutic Interventions/Progress Updates:    Engaged in ADL retraining with focus on UB dressing and transfers.  Pt requesting to focus on transfer training to increase ability to direct his care in preparation for transfer to SNF and with different staff over the weekend.  Pt doffed dirty shirt and donned shirt with supervision with verbal cues to pull shirt over Lt elbow to increase success when pulling over head.  Engaed in squat pivot transfers bed <> w/c to both Rt and Lt side followed by transfer bed > drop arm BSC > w/c.  Pt with mod assist with all transfers this session with only 1 assist.  Pt able to verbally describe set up of equipment as well as body with min question cues for clarification.  Improved motor control in LUE when weight bearing either on Lt knee or on arm rest during transfers.    Therapy Documentation Precautions:  Precautions Precautions: Fall Restrictions Weight Bearing Restrictions: No Pain: Pain Assessment Pain Assessment: No/denies pain  See FIM for current functional status  Therapy/Group: Individual Therapy  Simonne Come 05/01/2014, 10:46 AM

## 2014-05-01 NOTE — Plan of Care (Signed)
Problem: RH Tub/Shower Transfers Goal: LTG Patient will perform tub/shower transfers w/assist (OT) LTG: Patient will perform tub/shower transfers with assist, with/without cues using equipment (OT)  Outcome: Not Applicable Date Met:  23/30/07 D/C due to not a focus of treatment at this time and per pt's request

## 2014-05-01 NOTE — Progress Notes (Addendum)
During linen change with staff about 0200 pts left knee bumped the side rail and skin tear occurred, area cleansed with soap and water and small foam dsg applied to lateral aspect of patella. Open area about 2cm.   Left upper arm skin tear areas weepy with tegaderm dsg; areas cleansed with soap and water and telfa dsg with kerlix wrapped to avoid tape to skin. Patria Mane RN

## 2014-05-01 NOTE — Progress Notes (Signed)
Physical Therapy Weekly Progress Note  Patient Details  Name: [name] MRN: [id number] Date of Birth: 02/14/1951  Beginning of progress report period: April 24, 2014 End of progress report period: May 01, 2014  Today's Date: 05/01/2014 PT Individual Time: 0830-0930 PT Individual Time Calculation (min): 60 min   Patient has met 3 of 4 short term goals.  Pt has exhibited significant improvements in stability/independence with bed mobility, functional transfers, and w/c mobility since last progress note. Pt partially met short-term goal addressing transfers from bed<>w/c because despite pt performing basic transfers with min-mod A, pt does occasionally require +2A for additional person to stabilize w/c.  Patient continues to demonstrate the following deficits: muscle weakness, decreased oxygen support, impaired timing and sequencing, unbalanced muscle activation and decreased motor planning, decreased midline orientation and decreased attention to left, decreased standing balance, decreased postural control, hemiplegia, decreased balance strategies and therefore will continue to benefit from skilled PT intervention to enhance overall performance with activity tolerance, balance, postural control, ability to compensate for deficits, functional use of  left upper extremity and left lower extremity, and coordination.  Patient progressing toward long term goals..  Continue plan of care.  PT Short Term Goals Week 1:  PT Short Term Goal 1 (Week 1): Pt will perform bed mobility with mod A with HOB flat using rail. PT Short Term Goal 1 - Progress (Week 1): Partly met (Pt inconsistently able to perform supine > sit with Mod to Max A.) PT Short Term Goal 2 (Week 1): Pt will transfer from bed<>w/c with max A of single therapist. PT Short Term Goal 2 - Progress (Week 1): Partly met (Pt inconsistently able to transfer with mod A of single therapist.) PT Short Term Goal 3 (Week 1): Pt will perform  w/c mobility x50' with min A and 25% cueing. PT Short Term Goal 3 - Progress (Week 1): Met PT Short Term Goal 4 (Week 1): Pt will ambulate x25' wth Total A of single therapist (and w/c follow for safety, if needed). PT Short Term Goal 4 - Progress (Week 1): Not met PT Short Term Goal 5 (Week 1): Pt will perform static standing with single UE support requiring min A and 25% cueing for midline orientation. PT Short Term Goal 5 - Progress (Week 1): Not met Week 2:  PT Short Term Goal 1 (Week 2): Pt will perform supine > sit with mod A with HOB flat using bed rail. PT Short Term Goal 1 - Progress (Week 2): Met PT Short Term Goal 2 (Week 2): Pt will perform sit > supine with HOB flat and mod A. PT Short Term Goal 2 - Progress (Week 2): Met PT Short Term Goal 3 (Week 2): Pt will consistently transfer from bed<>w/c with mod A of single therapist. PT Short Term Goal 3 - Progress (Week 2): Partly met (Pt required 50% assistance but needed +2A for w/c stabilization at times) PT Short Term Goal 4 (Week 2): Pt will perform w/c mobility x75' with supervision and 25% cueing for technique. PT Short Term Goal 4 - Progress (Week 2): Met Week 3:  PT Short Term Goal 1 (Week 3): STG's = LTG's secondary to anticipated LOS. Awaiting SNF placement.  Skilled Therapeutic Interventions/Progress Updates:    2:1. Pt received semi reclined in bed; agreeable to therapy. Session focused on increasing pt independence with bed mobility, basic transfers, and w/c mobility. Pt performed supine > sit x2 trials with supervision/cueing with HOB flat, no rail. During initial  trial, pt required mod cueing for attention to, safe placement of LUE/LLE as well as management of LLE using RLE. Pt gave effective return demonstration during subsequent trial. Pt performed sit>supine with HOB flat, no rail with supervision, increased time using RLE to manage LLE (no cueing required).   Transitioned to pt performing squat pivot transfers from  bed<>w/c with mod A, manual stabilization of LUE (+2A required for stabilization of w/c when pt transferring to L side). Pt initially required 25% cueing for transfer setup. Pt performed w/c mobility x75' in controlled environment with R hemi technique and supervision, increased time, and 25% cueing for technique with negotiation of door frame. In ortho gym, pt performed blocked practice of squat pivot transfers from w/c <> mat table (level) with min-mod A, subtle cueing for transfer setup. Explained, demonstrated management of w/c leg rests with effective return demonstration for remainder of session. Session ended in pt room, where pt was left seated in w/c with L half lap tray on and all needs within reach.  Therapy Documentation Precautions:  Precautions Precautions: Fall Restrictions Weight Bearing Restrictions: No Vital Signs: Therapy Vitals Temp: 98.2 F (36.8 C) Temp Source: Oral Pulse Rate: 63 Resp: 17 BP: (!) 123/45 mmHg Oxygen Therapy SpO2: 97 % O2 Device: Nasal Cannula O2 Flow Rate (L/min): 105 L/min Pain: Pain Assessment Pain Assessment: No/denies pain Locomotion : Wheelchair Mobility Distance: 75   See FIM for current functional status  Therapy/Group: Individual Therapy  [place], Malva Cogan 05/01/2014, 9:57 AM

## 2014-05-02 ENCOUNTER — Inpatient Hospital Stay (HOSPITAL_COMMUNITY): Payer: BLUE CROSS/BLUE SHIELD | Admitting: Physical Therapy

## 2014-05-02 ENCOUNTER — Inpatient Hospital Stay (HOSPITAL_COMMUNITY): Payer: BLUE CROSS/BLUE SHIELD | Admitting: Occupational Therapy

## 2014-05-02 ENCOUNTER — Inpatient Hospital Stay (HOSPITAL_COMMUNITY): Payer: BLUE CROSS/BLUE SHIELD | Admitting: Speech Pathology

## 2014-05-02 NOTE — Progress Notes (Signed)
64 y.o. right handed male history of HTN, COPD-oxygen dependent, A fib on Eliquis, s/p MAZE June 2014. He was independent prior to admission, lives alone and was using a walker due to back pain.  He was admitted on 04/07/2014 past  with left-sided weakness and mild slurred speech. CCT showed a right IC basal ganglia hemorrhage with a 4 mm right to left midline shift and BP 158/85 at admission. Eliquis was  reversed with Kcentra and BP monitored closely. .  Repeat carotid dopplers done revealing 60- 79% L-ICA and 40- 59% R-ICA stenosis   Subjective/Complaints: No dizziness no pain c/os, wearing bilateral knee orthoses  Review of Systems - otherwise negative  Objective: Vital Signs: Blood pressure 121/44, pulse 61, temperature 97.7 F (36.5 C), temperature source Oral, resp. rate 17, height 5\' 10"  (1.778 m), weight 93.5 kg (206 lb 2.1 oz), SpO2 95 %. No results found. Results for orders placed or performed during the hospital encounter of 04/13/14 (from the past 72 hour(s))  Digoxin level     Status: Abnormal   Collection Time: 04/30/14  9:04 AM  Result Value Ref Range   Digoxin Level 0.4 (L) 0.8 - 2.0 ng/mL     HEENT: normal and O2  Cardio: RRR and murmur Resp: CTA B/L and unlabored, no rales or wheezes GI: BS positive and NT, ND Extremity:  1+ pedal edema on left side only Skin:   Bruise multiple eccymotic areas in LUE and BLE Neuro: Alert/Oriented, Cranial Nerve II-XII normal, Abnormal Sensory reduced on Left, Abnormal Motor 3/5 LUE, 3- Left HF, 4/5 L KE, 3- L ankle DF and Abnormal FMC Ataxic on left side/ dec FMC, sensory absent on left side Musc/Skel:    No swelling or pain with AROM Right knee.   Gen NAD Psych: pleasant and appropriate.    Assessment/Plan: 1. Functional deficits secondary to right BG/IC hemorrhage with left hemiparesis and hemisensory loss which require 3+ hours per day of interdisciplinary therapy in a comprehensive inpatient rehab setting. Physiatrist is  providing close team supervision and 24 hour management of active medical problems listed below. Physiatrist and rehab team continue to assess barriers to discharge/monitor patient progress toward functional and medical goals.  FIM: FIM - Bathing Bathing Steps Patient Completed: Chest, Right Arm, Left Arm, Abdomen, Right upper leg, Left upper leg, Front perineal area Bathing: 3: Mod-Patient completes 5-7 17f 10 parts or 50-74%  FIM - Upper Body Dressing/Undressing Upper body dressing/undressing steps patient completed: Thread/unthread right sleeve of pullover shirt/dresss, Thread/unthread left sleeve of pullover shirt/dress, Put head through opening of pull over shirt/dress, Pull shirt over trunk Upper body dressing/undressing: 5: Supervision: Safety issues/verbal cues FIM - Lower Body Dressing/Undressing Lower body dressing/undressing steps patient completed: Thread/unthread right pants leg, Thread/unthread left pants leg Lower body dressing/undressing: 1: Two helpers  FIM - Musician Devices: Grab bar or rail for support Toileting: 1: Two helpers  FIM - Radio producer Devices: Recruitment consultant Transfers: 3-To toilet/BSC: Mod A (lift or lower assist), 3-From toilet/BSC: Mod A (lift or lower assist)  FIM - Control and instrumentation engineer Devices: Arm rests, Orthosis Bed/Chair Transfer: 3: Bed > Chair or W/C: Mod A (lift or lower assist), 3: Chair or W/C > Bed: Mod A (lift or lower assist)  FIM - Locomotion: Wheelchair Distance: 75 Locomotion: Wheelchair: 2: Travels 50 - 149 ft with supervision, cueing or coaxing FIM - Locomotion: Ambulation Locomotion: Ambulation Assistive Devices: Orthosis, Other (comment) (bilat knee orthoses) Ambulation/Gait Assistance:  1: +2 Total assist Locomotion: Ambulation: 0: Activity did not occur  Comprehension Comprehension Mode: Auditory Comprehension: 5-Follows basic  conversation/direction: With extra time/assistive device  Expression Expression Mode: Verbal Expression: 5-Expresses basic needs/ideas: With no assist  Social Interaction Social Interaction Mode: Asleep Social Interaction: 5-Interacts appropriately 90% of the time - Needs monitoring or encouragement for participation or interaction.  Problem Solving Problem Solving: 5-Solves basic 90% of the time/requires cueing < 10% of the time  Memory Memory: 4-Recognizes or recalls 75 - 89% of the time/requires cueing 10 - 24% of the time  Medical Problem List and Plan: 1. Functional deficits secondary to right BG/IC hemorrhage with left hemiparesis and hemisensory loss, Check follow up CT head and then will need Neuro and cardiology to weigh in on resuming Eliquis vs another agent    2.  DVT Prophylaxis/Anticoagulation: Continue SCDs. Add TEDs. Monitor for any signs of DVT. 3. Pain Management/chronic back pain: Tylenol as needed for pain. Add voltaren gel. Continue Zanaflex 8 mg twice a day 4. Dysphagia. Dysphagia 3 nectar liquids. Adv per slp 5. Neuropsych: This patient is capable of making decisions on her own behalf. 6. Skin/Wound Care: Routine skin checks 7. Fluids/Electrolytes/Nutrition: improved intake. Off ivf. bmet stable but had CKD 8. Hypertension/CV. Monitor every 8 hours. Continue Coreg 12.5 mg twice a day, Aldactone 25 mg daily, Demadex as directed, Zaroxolyn 2.5 mg Monday Wednesday Friday.   -continue to hold diuretics except for aldactone  -potassium  supplementation    -po intake much improved  -appreciate cards follow up  -follow up bmet overall improved 9. Atrial fibrillation/status post Maze procedure June 2014: Eliquis on hold secondary to hemorrhage. Continue Lanoxin 0.25 mg every other day. Monitor HR tid. Cont Coreg HR in 60s  10. Hyperlipidemia. Lipitor 11. History of gouty arthritis: no active joint involvement held colchicine due to diarrhea . 12. COPD: Respiratory  status stable. Continue Symbicort twice daily. 13. H/o  Peripheral edema with weeping:   Low salt diet.     14. Constipation: ----improved   LOS (Days) 19 A FACE TO FACE EVALUATION WAS PERFORMED  Lynnann Knudsen E 05/02/2014, 7:40 AM

## 2014-05-02 NOTE — Progress Notes (Signed)
Physical Therapy Session Note  Patient Details  Name: [name] MRN: [id number] Date of Birth: May 01, 1950  Today's Date: 05/02/2014 PT Individual Time: 0900-1000 PT Individual Time Calculation (min): 60 min   Short Term Goals: Week 1:  PT Short Term Goal 1 (Week 1): Pt will perform bed mobility with mod A with HOB flat using rail. PT Short Term Goal 1 - Progress (Week 1): Partly met (Pt inconsistently able to perform supine > sit with Mod to Max A.) PT Short Term Goal 2 (Week 1): Pt will transfer from bed<>w/c with max A of single therapist. PT Short Term Goal 2 - Progress (Week 1): Partly met (Pt inconsistently able to transfer with mod A of single therapist.) PT Short Term Goal 3 (Week 1): Pt will perform w/c mobility x50' with min A and 25% cueing. PT Short Term Goal 3 - Progress (Week 1): Met PT Short Term Goal 4 (Week 1): Pt will ambulate x25' wth Total A of single therapist (and w/c follow for safety, if needed). PT Short Term Goal 4 - Progress (Week 1): Not met PT Short Term Goal 5 (Week 1): Pt will perform static standing with single UE support requiring min A and 25% cueing for midline orientation. PT Short Term Goal 5 - Progress (Week 1): Not met  Skilled Therapeutic Interventions/Progress Updates:  Pt was seen bedside in the am. Pt transported to rehab gym. In gym, treatment focused on squat pivot transfers w/c to mat, mat to w/c, multiple times. Utilized weight on L UE for increased proprioceptive input. Pt performed transfers initially with mod A and progressed to min to mod A with multiple verbal cues for technique and safety. Performed transfers to both the left and right side. Pt returned to room following treatment and left sitting up in w/c with call bell within reach.   Therapy Documentation Precautions:  Precautions Precautions: Fall Restrictions Weight Bearing Restrictions: No General:  Pain: Mild c/o L shoulder pain.   See FIM for current functional  status  Therapy/Group: Individual Therapy  Dub Amis 05/02/2014, 1:19 PM

## 2014-05-02 NOTE — Progress Notes (Addendum)
Occupational Therapy Session Note  Patient Details  Name: [name] MRN: [id number] Date of Birth: 26-Mar-1951  Today's Date: 05/02/2014 OT Individual Time: 0902-1002 OT Individual Time Calculation (min): 60 min    Short Term Goals: Week 1:  OT Short Term Goal 1 (Week 1): Patient will perform UB/LB bathing in sit<>stand position with moderate assistance (1 person), using mechanical lift prn OT Short Term Goal 1 - Progress (Week 1): Partly met OT Short Term Goal 2 (Week 1): Patient will perform UB dressing with min assist while seated unsupported OT Short Term Goal 2 - Progress (Week 1): Progressing toward goal OT Short Term Goal 3 (Week 1): Patient will perform LB dressing with moderate assistance in sit<>stand position (1 person), using mechanical lift prn  OT Short Term Goal 3 - Progress (Week 1): Not met OT Short Term Goal 4 (Week 1): Patient will perform toilet transfer using BSC prn with moderate assistance (1 person), using mechanical lift prn OT Short Term Goal 4 - Progress (Week 1): Not met OT Short Term Goal 5 (Week 1): Patient will be educated on a strengthening HEP OT Short Term Goal 5 - Progress (Week 1): Not met Week 2:  OT Short Term Goal 1 (Week 2): Pt will complete bathing at sit <> stand level with max assist of 1 person OT Short Term Goal 1 - Progress (Week 2): Progressing toward goal OT Short Term Goal 2 (Week 2): Pt will complete UB dressing with min assist while seated unsupported OT Short Term Goal 2 - Progress (Week 2): Met OT Short Term Goal 3 (Week 2): Pt will complete LB dressing at sit <> stand level with max assist of 1 person OT Short Term Goal 3 - Progress (Week 2): Progressing toward goal OT Short Term Goal 4 (Week 2): Pt will complete toilet transfer to [facility] with mod assist of 1 person OT Short Term Goal 4 - Progress (Week 2): Met Week 3:  OT Short Term Goal 1 (Week 3): Pt will complete bathing at sit <> stand level with max assist of 1 person OT Short  Term Goal 2 (Week 3): Pt will complete LB dressing at sit <> stand level with max assist of 1 person OT Short Term Goal 3 (Week 3): Pt will complete UB dressing with supervision OT Short Term Goal 4 (Week 3): Pt will use LUE as gross assist with self-care tasks with min verbal cues      Skilled Therapeutic Interventions/Progress Updates:      Pt scheduled for B/D this am but he did not want to bathe as he was already dressed and stated he had bathed yesterday. He and the nurse reported that the patient has just completed several squat pivot transfers (bed >< BSC and bed to w/c) in which he transferred with guiding assist only. Pt declined working on further transfers for this session, stating he had done enough already. Encouraged pt to work on sit to stand based on his STGs this week, but pt stated he could not do that today and he would try for the first time on Monday as he was too afraid to stand. Attempted to discuss pt's fears, but he was not able to identify what exactly he was afraid of. Pt wanted to work on LUE coordination, so convinced pt to work on very small push ups from w/c arm rests and holding each push up for 2 counts. Pt's L arm stabilized on arm rest with dycem and support to  maintain his grip. Pt has great difficulty with controlled descent, but it improved with forward lean. Pt needed multiple rest breaks as we completed several sets of these push ups. Pt then worked on LUE coordination with BUE AROM using 3# dowel bar with elbow flexion and small ROM of shoulder flexion. He has L shoulder pain with kyphotic posture and anteriorly tilted scapula. Pt worked on active scapular retraction exercises and encouraged to continue working on those.  Pt resting in w/c with call light in reach.    Therapy Documentation Precautions:  Precautions Precautions: Fall Restrictions Weight Bearing Restrictions: No   Vital Signs: Therapy Vitals Temp: 97.7 F (36.5 C) Temp Source: Oral Pulse  Rate: 61 Resp: 17 BP: (!) 121/44 mmHg Oxygen Therapy SpO2: 98 % O2 Device: Nasal Cannula O2 Flow Rate (L/min): 1.5 L/min Pain: Pain Assessment Pain Assessment: 0-10 Pain Score: 3  Pain Type: Acute pain Pain Location: Shoulder Pain Orientation: Left Pain Descriptors / Indicators: Aching Pain Onset: On-going Pain Intervention(s): Medication (See eMAR) ADL:  See FIM for current functional status  Therapy/Group: Individual Therapy  [name] 05/02/2014, 9:12 AM

## 2014-05-02 NOTE — Progress Notes (Signed)
Speech Language Pathology Daily Session Note  Patient Details  Name: [name] MRN: [id number] Date of Birth: May 19, 1950  Today's Date: 05/02/2014 SLP Individual Time: 1435-1500 SLP Individual Time Calculation (min): 25 min  Short Term Goals: Week 3: SLP Short Term Goal 1 (Week 3): Pt will demonstrate improved mastication for toleration of regular textures with Mod I  SLP Short Term Goal 2 (Week 3): Pt will complete oral motor exercises targeting left sided weakness to improve mastication and manipulation of regular solid boluses with supervision.  SLP Short Term Goal 3 (Week 3): Pt will improve recall of semi-complex information via use of compensatory strategies over 80% of observable opportunities with supervision cues.  SLP Short Term Goal 4 (Week 3): Pt will improve functional problem solving skills (i.e. planning, thought organization, and error awareness) during complex tasks over 80% of observable opportunities with supervision cues.   Skilled Therapeutic Interventions: Skilled treatment session focused on addressing dysphagia goals.  SLP facilitated session with set-up of regular textures and thin liquids via straw with intermittent cues for utilization of recommended safe swallow strategies, which were effective at preventing overt s/s of aspiration.  Recommend diet advancement to regular textures with patient requesting heart healthy/low salt restrictions.      FIM:  Comprehension Comprehension Mode: Auditory Comprehension: 5-Follows basic conversation/direction: With extra time/assistive device Expression Expression Mode: Verbal Expression: 5-Expresses basic needs/ideas: With no assist Social Interaction Social Interaction: 5-Interacts appropriately 90% of the time - Needs monitoring or encouragement for participation or interaction. Problem Solving Problem Solving: 5-Solves basic 90% of the time/requires cueing < 10% of the time Memory Memory: 4-Recognizes or recalls  75 - 89% of the time/requires cueing 10 - 24% of the time FIM - Eating Eating Activity: 5: Supervision/cues;5: Set-up assist for open containers  Pain Pain Assessment Pain Assessment: No/denies pain  Therapy/Group: Individual Therapy  Carmelia Roller., [place] [phone]  [place] 05/02/2014, 4:19 PM

## 2014-05-02 NOTE — Plan of Care (Signed)
Problem: RH BOWEL ELIMINATION Goal: RH STG MANAGE BOWEL WITH ASSISTANCE STG Manage Bowel with mod Assistance.  Outcome: Not Progressing LBM 04-29-13 Goal: RH STG MANAGE BOWEL W/MEDICATION W/ASSISTANCE STG Manage Bowel with Medication with mod I Assistance.  Outcome: Not Progressing LBM 04-29-13

## 2014-05-03 ENCOUNTER — Inpatient Hospital Stay (HOSPITAL_COMMUNITY): Payer: BLUE CROSS/BLUE SHIELD | Admitting: Physical Therapy

## 2014-05-03 ENCOUNTER — Inpatient Hospital Stay (HOSPITAL_COMMUNITY): Payer: BLUE CROSS/BLUE SHIELD

## 2014-05-03 ENCOUNTER — Encounter (HOSPITAL_COMMUNITY): Payer: Self-pay

## 2014-05-03 NOTE — Progress Notes (Signed)
Physical Therapy Session Note  Patient Details  Name: [name] MRN: [id number] Date of Birth: 01/05/1951  Today's Date: 05/03/2014 PT Individual Time: 1106-1203 PT Individual Time Calculation (min): 57 min   Short Term Goals: Week 3:  PT Short Term Goal 1 (Week 3): STG's = LTG's secondary to anticipated LOS. Awaiting SNF placement.  Skilled Therapeutic Interventions/Progress Updates:   Pt received in w/c; pt more engaged in therapy session and more encouraged with progress.  Pt requesting to continue to work on control of squat pivot transfers.  Adjusted bilat knee braces and transported pt to gym in w/c total A.  In gym performed transfers mat <> w/c x 2 reps with squat pivot with min A overall with therapists only providing tactile feedback to LUE and LLE to maintain WB and positioning during transfer; pt able to assist with w/c set up and able to position hips in preparation for transfer.  On mat performed NMR; see below for details.  Returned to room total A in w/c and positioned with all items within reach.  Pt encouraged by progress and session.     Therapy Documentation Precautions:  Precautions Precautions: Fall Restrictions Weight Bearing Restrictions: No Vital Signs: Oxygen Therapy SpO2: 96 % O2 Device: Nasal Cannula O2 Flow Rate (L/min): 1.5 L/min Pain: Pain Assessment Pain Assessment: No/denies pain Other Treatments: Treatments Neuromuscular Facilitation: Left;Upper Extremity;Lower Extremity;Forced use;Activity to increase coordination;Activity to increase motor control;Activity to increase timing and sequencing;Activity to increase grading;Activity to increase sustained activation;Activity to increase lateral weight shifting;Activity to increase anterior-posterior weight shifting seated on mat with pt placing LUE on chair in front of him and performing multiple anterior/L lateral weight shifts while transitioning into a squat with focus in increased weight shift, WB  through LLE and LUE and increased motor control while transitioning sit <> squat and maintaining squat to perform lateral scoots to L and R down mat.  During squats pt denied any pain in knees.  Transitioned to sit > stand from mat with bilat UE support on stair rails with pt demonstrating improved sit > stand sequence, forwards weight shift and improved alignment of trunk over BOS in standing; performed lateral weight shifting in standing but pt fatigued quickly and requested to sit.  Pt denied any pain in standing and felt more confident with his stand today.    See FIM for current functional status  Therapy/Group: Individual Therapy  Raylene Everts [facility] 05/03/2014, 12:57 PM

## 2014-05-03 NOTE — Progress Notes (Signed)
64 y.o. right handed male history of HTN, COPD-oxygen dependent, A fib on Eliquis, s/p MAZE June 2014. He was independent prior to admission, lives alone and was using a walker due to back pain.  He was admitted on 04/07/2014 past  with left-sided weakness and mild slurred speech. CCT showed a right IC basal ganglia hemorrhage with a 4 mm right to left midline shift and BP 158/85 at admission. Eliquis was  reversed with Kcentra and BP monitored closely. .  Repeat carotid dopplers done revealing 60- 79% L-ICA and 40- 59% R-ICA stenosis   Subjective/Complaints: Discussed CT head for today Discussed progress as well as sensory deficits  Review of Systems - otherwise negative  Objective: Vital Signs: Blood pressure 150/56, pulse 68, temperature 97.8 F (36.6 C), temperature source Oral, resp. rate 19, height 5\' 10"  (1.778 m), weight 96.4 kg (212 lb 8.4 oz), SpO2 100 %. No results found. Results for orders placed or performed during the hospital encounter of 04/13/14 (from the past 72 hour(s))  Digoxin level     Status: Abnormal   Collection Time: 04/30/14  9:04 AM  Result Value Ref Range   Digoxin Level 0.4 (L) 0.8 - 2.0 ng/mL     HEENT: normal and O2 [place]rell Cardio: RRR and murmur Resp: CTA B/L and unlabored, no rales or wheezes GI: BS positive and NT, ND Extremity:  1+ pedal edema on left side only Skin:   Bruise multiple eccymotic areas in LUE and BLE Neuro: Alert/Oriented, Cranial Nerve II-XII normal, Abnormal Sensory reduced on Left, Abnormal Motor 3/5 LUE, 3- Left HF, 4/5 L KE, 3- L ankle DF and Abnormal FMC Ataxic on left side/ dec FMC, sensory absent on left side Musc/Skel:    No swelling or pain with AROM Right knee.   Gen NAD Psych: pleasant and appropriate.    Assessment/Plan: 1. Functional deficits secondary to right BG/IC hemorrhage with left hemiparesis and hemisensory loss which require 3+ hours per day of interdisciplinary therapy in a comprehensive inpatient rehab  setting. Physiatrist is providing close team supervision and 24 hour management of active medical problems listed below. Physiatrist and rehab team continue to assess barriers to discharge/monitor patient progress toward functional and medical goals.  FIM: FIM - Bathing Bathing Steps Patient Completed: Chest, Right Arm, Left Arm, Abdomen, Right upper leg, Left upper leg, Front perineal area Bathing: 3: Mod-Patient completes 5-7 21f 10 parts or 50-74%  FIM - Upper Body Dressing/Undressing Upper body dressing/undressing steps patient completed: Thread/unthread right sleeve of pullover shirt/dresss, Thread/unthread left sleeve of pullover shirt/dress, Put head through opening of pull over shirt/dress, Pull shirt over trunk Upper body dressing/undressing: 5: Supervision: Safety issues/verbal cues FIM - Lower Body Dressing/Undressing Lower body dressing/undressing steps patient completed: Thread/unthread right pants leg, Thread/unthread left pants leg Lower body dressing/undressing: 1: Two helpers  FIM - Musician Devices: Grab bar or rail for support Toileting: 1: Two helpers  FIM - Radio producer Devices: Recruitment consultant Transfers: 4-To toilet/BSC: Min A (steadying Pt. > 75%), 4-From toilet/BSC: Min A (steadying Pt. > 75%)  FIM - Bed/Chair Transfer Bed/Chair Transfer Assistive Devices: Arm rests Bed/Chair Transfer: 4: Bed > Chair or W/C: Min A (steadying Pt. > 75%), 4: Chair or W/C > Bed: Min A (steadying Pt. > 75%)  FIM - Locomotion: Wheelchair Distance: 75 Locomotion: Wheelchair: 2: Travels 50 - 149 ft with supervision, cueing or coaxing FIM - Locomotion: Ambulation Locomotion: Ambulation Assistive Devices: Orthosis, Other (comment) (bilat knee orthoses)  Ambulation/Gait Assistance: 1: +2 Total assist Locomotion: Ambulation: 0: Activity did not occur  Comprehension Comprehension Mode: Auditory Comprehension: 5-Follows basic  conversation/direction: With extra time/assistive device  Expression Expression Mode: Verbal Expression: 5-Expresses basic needs/ideas: With no assist  Social Interaction Social Interaction Mode: Asleep Social Interaction: 6-Interacts appropriately with others with medication or extra time (anti-anxiety, antidepressant).  Problem Solving Problem Solving: 5-Solves basic problems: With no assist  Memory Memory: 5-Recognizes or recalls 90% of the time/requires cueing < 10% of the time  Medical Problem List and Plan: 1. Functional deficits secondary to right BG/IC hemorrhage with left hemiparesis and hemisensory loss, Check follow up CT head and then will need Neuro and cardiology to weigh in on resuming Eliquis vs another agent    2.  DVT Prophylaxis/Anticoagulation: Continue SCDs. Add TEDs. Monitor for any signs of DVT. 3. Pain Management/chronic back pain: Tylenol as needed for pain. Add voltaren gel. Continue Zanaflex 8 mg twice a day 4. Dysphagia. Dysphagia 3 nectar liquids. Adv per slp 5. Neuropsych: This patient is capable of making decisions on her own behalf. 6. Skin/Wound Care: Routine skin checks 7. Fluids/Electrolytes/Nutrition: improved intake. Off ivf. bmet stable 8. Hypertension/CV. Monitor every 8 hours. Continue Coreg 12.5 mg twice a day, Aldactone 25 mg daily, Demadex as directed, Zaroxolyn 2.5 mg Monday Wednesday Friday.   -continue to hold diuretics except for aldactone  -potassium  supplementation    -po intake much improved  -appreciate cards follow up  -follow up bmet overall improved 9. Atrial fibrillation/status post Maze procedure June 2014: Eliquis on hold secondary to hemorrhage. Continue Lanoxin 0.25 mg every other day.Dig level low but HR is low as well  Monitor HR tid. Cont Coreg HR in 60s  10. Hyperlipidemia. Lipitor 11. History of gouty arthritis: no active joint involvement held colchicine due to diarrhea . 12. COPD: Respiratory status stable. Continue  Symbicort twice daily. 13. H/o  Peripheral edema with weeping:   Low salt diet.     14. Constipation: ----improved   LOS (Days) 20 A FACE TO FACE EVALUATION WAS PERFORMED  KIRSTEINS,ANDREW E 05/03/2014, 7:39 AM

## 2014-05-04 ENCOUNTER — Inpatient Hospital Stay (HOSPITAL_COMMUNITY): Payer: BLUE CROSS/BLUE SHIELD | Admitting: Occupational Therapy

## 2014-05-04 ENCOUNTER — Inpatient Hospital Stay (HOSPITAL_COMMUNITY): Payer: Self-pay | Admitting: Speech Pathology

## 2014-05-04 ENCOUNTER — Encounter (HOSPITAL_COMMUNITY): Payer: Self-pay

## 2014-05-04 ENCOUNTER — Inpatient Hospital Stay (HOSPITAL_COMMUNITY): Payer: BLUE CROSS/BLUE SHIELD | Admitting: Physical Therapy

## 2014-05-04 MED ORDER — TIZANIDINE HCL 4 MG PO TABS: 4.0000 mg | ORAL_TABLET | Freq: Three times a day (TID) | ORAL | Status: DC

## 2014-05-04 MED ORDER — DICLOFENAC SODIUM 1 % TD GEL: 2.0000 g | Freq: Four times a day (QID) | TRANSDERMAL | Status: DC

## 2014-05-04 MED ORDER — DIGOXIN 125 MCG PO TABS: 0.1250 mg | ORAL_TABLET | ORAL | Status: DC

## 2014-05-04 MED ORDER — MIRTAZAPINE 7.5 MG PO TABS: 7.5000 mg | ORAL_TABLET | Freq: Every day | ORAL | Status: DC

## 2014-05-04 MED ORDER — CARVEDILOL 12.5 MG PO TABS: 12.5000 mg | ORAL_TABLET | Freq: Two times a day (BID) | ORAL | Status: DC

## 2014-05-04 MED ORDER — POLYETHYLENE GLYCOL 3350 17 G PO PACK: 17.0000 g | PACK | ORAL | Status: DC

## 2014-05-04 MED ORDER — TRAMADOL HCL 50 MG PO TABS: 50.0000 mg | ORAL_TABLET | Freq: Four times a day (QID) | ORAL | Status: DC | PRN

## 2014-05-04 MED ORDER — LISINOPRIL 2.5 MG PO TABS
2.5000 mg | ORAL_TABLET | Freq: Every day | ORAL | Status: DC
  Administered 2014-05-04 – 2014-05-05 (×2): 2.5 mg via ORAL
  Filled 2014-05-04 (×3): qty 1

## 2014-05-04 MED ORDER — SENNOSIDES-DOCUSATE SODIUM 8.6-50 MG PO TABS: 2.0000 | ORAL_TABLET | Freq: Every day | ORAL | Status: DC

## 2014-05-04 MED ORDER — PANTOPRAZOLE SODIUM 40 MG PO TBEC: 40.0000 mg | DELAYED_RELEASE_TABLET | Freq: Every day | ORAL | Status: DC

## 2014-05-04 MED ORDER — ASPIRIN EC 81 MG PO TBEC
81.0000 mg | DELAYED_RELEASE_TABLET | Freq: Every day | ORAL | Status: DC
  Administered 2014-05-04 – 2014-05-05 (×2): 81 mg via ORAL
  Filled 2014-05-04 (×4): qty 1

## 2014-05-04 MED ORDER — LISINOPRIL 2.5 MG PO TABS: 2.5000 mg | ORAL_TABLET | Freq: Every day | ORAL | Status: DC

## 2014-05-04 MED ORDER — TORSEMIDE 20 MG PO TABS
20.0000 mg | ORAL_TABLET | Freq: Every day | ORAL | Status: DC
  Administered 2014-05-04 – 2014-05-05 (×2): 20 mg via ORAL
  Filled 2014-05-04 (×3): qty 1

## 2014-05-04 MED ORDER — SPIRONOLACTONE 25 MG PO TABS: 12.5000 mg | ORAL_TABLET | Freq: Every day | ORAL | Status: DC

## 2014-05-04 MED ORDER — TORSEMIDE 20 MG PO TABS: ORAL_TABLET | ORAL | Status: DC

## 2014-05-04 MED ORDER — ASPIRIN 81 MG PO TBEC: 81.0000 mg | DELAYED_RELEASE_TABLET | Freq: Every day | ORAL | Status: DC

## 2014-05-04 NOTE — Progress Notes (Signed)
Patient ID: [name], male   DOB: 18-Feb-1951, 64 y.o.   MRN: [id number] Patient ID: [name], male   DOB: 1950/07/28, 64 y.o.   MRN: [id number] Advanced Heart Failure Rounding Note   Subjective:   [name] is 64 y.o. male with past medical history of severe aortic stenosis s/p tissue AV replacement (09/2012) atrial fibrillation s/p MAZE (09/2012) chronic combined systolic/diastolic HF, RV failure, HTN, PAD s/p L iliac stent in 2010 and aortobifemoral bypass graft, ETOH abuse, severe COPD with cor pulmonale, CAD s/p CABG with AV replacement and back pain from spinal stenosis. He also has a history of acute renal failure complicated by listeria bacteremia and Afib RVR 09/2013. Marland KitchenHe had TEE DC-CV but went back into afib with controlled rate on amiodarone and eliquis.   Admitted to [facility] 04/07/14 with left sided weakness CT of head showed right basal ganglial ICH with interventricular extension and 40mm of midline shift noted. Anticoagulants reversed. Plan to reconsider anticoagulants in 3-4 weeks if CT head is normal. Once stable, he was transferred to CIR. He was admitted to [facility] 04/13/14. Has been followed closely by PT/OT for ongoing therapry. Has ongoing L hemiparesis and hemisensory loss.   He got IV fluids due to elevated creatinine, now off. Renal function trending back down. Weight stable.  Diuretics have been held.   Denies SOB.   04/09/14 Creatinine1.35 BUN 18 04/21/14 Creatinine 2.0 BUN 119  04/22/14 Creatinine 1.97 K 3.1 Dig level 1.2  04/23/14 Creatinine 1.45 K 3.4 NA 136  04/24/13 creatinine 1.35 04/27/13 K 5.0 Creatinine 1.4   Objective:   Weight Range:  Vital Signs:   Temp:  [98 F (36.7 C)] 98 F (36.7 C) (01/11 0553) Pulse Rate:  [58-66] 66 (01/11 0553) Resp:  [18] 18 (01/11 0553) BP: (143-146)/(48-54) 143/48 mmHg (01/11 0553) SpO2:  [96 %-99 %] 99 % (01/11 0553) Weight:  [214 lb 8.1 oz (97.3 kg)] 214 lb 8.1 oz (97.3 kg) (01/11 0500) Last BM Date:  05/02/14  Weight change: Filed Weights   05/02/14 0600 05/03/14 0500 05/04/14 0500  Weight: 206 lb 2.1 oz (93.5 kg) 212 lb 8.4 oz (96.4 kg) 214 lb 8.1 oz (97.3 kg)    Intake/Output:   Intake/Output Summary (Last 24 hours) at 05/04/14 0849 Last data filed at 05/04/14 0700  Gross per 24 hour  Intake    420 ml  Output    700 ml  Net   -280 ml    Physical Exam: General: Chronically ill appearing. No resp difficulty. Sitting in wheelchair.  HEENT: normal Neck: supple. JVP 8 cm. Carotids 2+ bilat; no bruits. No lymphadenopathy or thryomegaly appreciated. Cor: PMI nondisplaced. Regular rate & rhythm. No rubs, gallops or murmurs. Lungs: clear on 2 liters  Abdomen: soft, nontender, distended. No hepatosplenomegaly. No bruits or masses. Good bowel sounds. Extremities: no cyanosis, clubbing, rash, edema. LUE LLE weakness noted.  Neuro: alert & orientedx3, cranial nerves grossly intact. moves all 4 extremities w/o difficulty. Affect pleasant   Labs: Basic Metabolic Panel: No results for input(s): NA, K, CL, CO2, GLUCOSE, BUN, CREATININE, CALCIUM, MG, PHOS in the last 168 hours.  Liver Function Tests: No results for input(s): AST, ALT, ALKPHOS, BILITOT, PROT, ALBUMIN in the last 168 hours. No results for input(s): LIPASE, AMYLASE in the last 168 hours. No results for input(s): AMMONIA in the last 168 hours.  CBC: No results for input(s): WBC, NEUTROABS, HGB, HCT, MCV, PLT in the last 168 hours.  Cardiac Enzymes: No results  for input(s): CKTOTAL, CKMB, CKMBINDEX, TROPONINI in the last 168 hours.  BNP: BNP (last 3 results)  Recent Labs  09/19/13 1941 09/24/13 0400 01/14/14 1050  PROBNP 3530.0* 1792.0* 872.6*     Other results:  EKG:   Imaging: Ct Head Wo Contrast  05/03/2014   CLINICAL DATA:  Follow-up right basal ganglia hemorrhage.  EXAM: CT HEAD WITHOUT CONTRAST  TECHNIQUE: Contiguous axial images were obtained from the base of the skull through the vertex without  intravenous contrast.  COMPARISON:  04/10/2014  FINDINGS: No evidence of parenchymal hemorrhage or extra-axial fluid collection. No mass lesion, mass effect, or midline shift.  No CT evidence of acute infarction.  Hypodensity in the right basal ganglia/thalamus (series 21/image 17), reflecting encephalomalacic changes related to prior hemorrhage.  Additional encephalomalacic changes related to prior left parietal infarct.  Small vessel ischemic changes.  Intracranial atherosclerosis.  Mild global cortical atrophy.  No ventriculomegaly.  The visualized paranasal sinuses are essentially clear. The mastoid air cells are unopacified.  No evidence of calvarial fracture.  IMPRESSION: Hypodensity/encephalomalacic changes in the right basal ganglia/thalamus, related to prior hemorrhage.  Encephalomalacic changes related to prior left parietal infarct.  Atrophy with small vessel ischemic changes and intracranial atherosclerosis.   Electronically Signed   By: Julian Hy M.D.   On: 05/03/2014 10:23     Medications:     Scheduled Medications: . antiseptic oral rinse  7 mL Mouth Rinse BID  . atorvastatin  80 mg Oral q1800  . budesonide-formoterol  2 puff Inhalation BID  . carvedilol  12.5 mg Oral BID WC  . diclofenac sodium  2 g Topical QID  . digoxin  0.125 mg Oral Q48H  . feeding supplement (ENSURE COMPLETE)  237 mL Oral Q1400  . feeding supplement (PRO-STAT SUGAR FREE 64)  30 mL Oral Daily  . feeding supplement (RESOURCE BREEZE)  1 Container Oral BID BM  . lisinopril  2.5 mg Oral Daily  . mirtazapine  7.5 mg Oral QHS  . MUSCLE RUB   Topical TID WC  . pantoprazole  40 mg Oral Daily  . polyethylene glycol  17 g Oral QODAY  . senna-docusate  2 tablet Oral QHS  . spironolactone  12.5 mg Oral Daily  . tiZANidine  4 mg Oral TID  . torsemide  20 mg Oral Daily    Infusions:    PRN Medications: acetaminophen, alum & mag hydroxide-simeth, bisacodyl, diphenhydrAMINE, guaiFENesin-dextromethorphan,  prochlorperazine **OR** prochlorperazine **OR** prochlorperazine, torsemide, traMADol, traZODone   Assessment/Plan   [name] is 64 year old with known biventricular HF and chronic A fib followed closely in the HF clinc. Admitted CIR after ICH requiring ongoing trehabilitation due deficits noted on the Left. The HF team was asked to consult by Dr Naaman Plummer for HF and worsening renal function.  1. ICH: off anticoagulants. Plan to repeat CT of head 3-4 weeks to reconsider anticoagulants.  2. Chronic Biventricular HF R>L NICM, EF 40-45%, TAPSE < 1.0 (09/2013). Weight is up, mild JVD.  - Continue Coreg and spironolactone.  - Start lisinopril 2.5 mg daily.  - Re-start diuretics, will begin with torsemide 20 mg daily.  - BMET in am.  3. Severe COPD with cor pulmonale: on chronic oxygen.  4 Chronic A fib s/p MAZE: failed DC-CV (09/2013). Today EKG appears to be NSR. Not on anticoagulants as above (ICH). Per Neuro will need repeat CT before considering anticoagulants. However, given ICH on Eliquis (lower risk for ICH than warfarin), I am concerned that restarting anticoagulation  will be high risk.  - Could eventually start coumadin with goal INR 1.8-2.3.  5. AKI: Suspect prerenal azotemia. Creatinine back to baseline. BMET tomorrow morning.  6. Hypokalemia: Resolved.  7. Hyponatremia: Suspect hypovolemic hyponatremia (use of metolazone - thiazide diuretic - also likely contributes). Resolved.     Length of Stay: [place]  05/04/2014, 8:49 AM

## 2014-05-04 NOTE — Consult Note (Signed)
DIAGNOSTIC EVALUATION - [facility]IDENTIAL [facility]   MEDICAL NECESSITY:  [name] was seen on the [facility] for an initial diagnostic evaluation owing to the patient's diagnosis of intracerebral hemorrhage.   According to medical records, [name] was admitted to the rehab unit owing to "Functional deficits secondary to right BG/IC hemorrhage with left hemiparesis and hemi-sensory loss." Records also indicate that he is a "64 y.o. right handed male [with] history of HTN, COPD-oxygen dependent, A fib on Eliquis, s/p MAZE June 2014. He was independent prior to admission, lives alone and was using a walker due to back pain.  He was admitted on 04/07/2014 with left-sided weakness and mild slurred speech. CCT showed a right IC basal ganglia hemorrhage with a 4 mm right to left midline shift and BP 158/85 at admission. Eliquis was reversed with Kcentra and BP monitored closely. Recommendations to repeat CCT in 3-4 weeks to help determine ability to resume Eliquis.    [name] was previously seen by this provider on 04/27/2014. Pertinent information from that visit is as follows:   IMPRESSION: [name] seemed more distant today and a few times the conversation he generated was out of context. I wonder if he is suffering from some degree of cognitive deficit. Emotionally, he is adjusting well to his admission and present medical situation. Although not preferable, he is okay with discharging to another rehab facility.   During today's visit, [name] continues to deny suffering any cognitive difficulty.  He seemed much brighter today and was less distant than last week when I was concerned that he was possibly cognitively deteriorating.  He described his current mood as "pretty good." Contrary to recommendation, he was not seen for cognitive screen last week. When I broached the topic about him seeming distant last week, he said that he  was not feeling that great as he had been concerned about certain physical tasks upcoming (i.e., practicing standing up). He then felt trepidation all weekend because he knew that this was scheduled for today. However, one of his therapists decided to try this with him on Sunday and it went very well. He is still nervous about doing this today but I think it helped him to practice this yesterday.   [name] will be transitioning to a skilled facility tomorrow. He said that he is okay with this transition as long as he is still going to the place that his family researched. I am unaware of any change in that plan. I think that he will do well and that his prognosis is good. The staff was described as "good" and he still has ample support via friends and family.   PROCEDURES ADMINISTERED: [1 unit 77824] Diagnostic clinical interview  Review of available records  Behavioral Evaluation: [name] was appropriately dressed for season and situation, and he appeared tidy and well-groomed. Normal posture was noted. He was friendly and rapport was easily established. His speech was as expected and he was able to express ideas effectively. His affect was brighter. Attention and motivation were good.    IMPRESSION: [name] appeared much brighter than last week and more cognitively capable. He agreed that last week he was not feeling that great (either mentally or physically). This has thankfully resolved. He is somewhat nervous about his therapy today but he realizes that he has been improving. He maintains a positive attitude for the most part. He is okay with transitioning to a skilled facility.  I  plan to hold off on any cognitive screen at this point. I encouraged him to contact me with any difficulties prior to (or post) discharge. I feel optimistic about his prognosis and encouraged him to follow-up with me as needed.   DIAGNOSES:  Intracerebral hemorrhage  Adjustment disorder with depressed  mood (mild)    [name], Psy.D.  Clinical Neuropsychologist

## 2014-05-04 NOTE — Progress Notes (Signed)
Occupational Therapy Session Note  Patient Details  Name: [name] MRN: [id number] Date of Birth: 02/06/1951  Today's Date: 05/04/2014 OT Individual Time: 7062-3762 OT Individual Time Calculation (min): 30 min  and Today's Date: 05/04/2014 OT Co-Treatment Time: 1100-1130 (co-tx with PT full time 1100-1200) OT Co-Treatment Time Calculation (min): 30 min   Short Term Goals: Week 3:  OT Short Term Goal 1 (Week 3): Pt will complete bathing at sit <> stand level with max assist of 1 person OT Short Term Goal 2 (Week 3): Pt will complete LB dressing at sit <> stand level with max assist of 1 person OT Short Term Goal 3 (Week 3): Pt will complete UB dressing with supervision OT Short Term Goal 4 (Week 3): Pt will use LUE as gross assist with self-care tasks with min verbal cues  Skilled Therapeutic Interventions/Progress Updates:    1) Engaged in skilled co-tx with primary PT with focus on sit > stand and standing as needed for LB bathing and dressing.  Bathing and dressing completed at sit > stand at sink with focus on upright, midline standing balance while washing buttocks and donning shorts.  Pt required min assist sit > stand with assist for control and to achieve upright, midline standing.  Use of mirror for visual input, verbal cues to weight shift to Lt, and tactile cues at Lt knee to all promote midline standing balance.  Initially +2 for standing balance, progressing to min-mod assist of one therapist.  Pt unwilling to attempt to wash buttocks in standing, but was able to pull pants up over hips with encouragement and multimodal cues to maintain midline standing.  Ambulated 5 steps in hallway with EVA walker and mirror for feedback to promote upright standing and increased awareness of body during ambulation, +2 during ambulation. See PT note for details of ambulation trial.  2) Engaged in therapeutic activity with focus on use of LUE.  Pt received seated upright in w/c having just  returned from PT requesting to complete tasks at seated level.  Focus on RUE NMR, coordination, and motor control with use of pegs.  Pt with decreased motor control when grasping pegs and attempting to over pronate to place in holes, applied 1/2# weight to Lt wrist with mild improvements in motor control with grasp, release, and manipulation of pegs.  Educated on visually attending to task to promote increased control and success.  Removed wrist weight with some carryover with control.    Therapy Documentation Precautions:  Precautions Precautions: Fall Restrictions Weight Bearing Restrictions: No General:   Vital Signs: Therapy Vitals Pulse Rate: (!) 59 BP: (!) 100/52 mmHg Pain: Pain Assessment Pain Assessment: No/denies pain Pain Score: 3  Pain Type: Chronic pain Pain Location: Shoulder Pain Orientation: Left Pain Descriptors / Indicators: Aching Pain Frequency: Intermittent Pain Onset: Gradual Patients Stated Pain Goal: 3 Pain Intervention(s): Medication (See eMAR);Repositioned;Emotional support (Medicating prior to therapy) Multiple Pain Sites: No  See FIM for current functional status  Therapy/Group: Individual Therapy and Co-Treatment  Simonne Come 05/04/2014, 12:53 PM

## 2014-05-04 NOTE — Discharge Summary (Signed)
Physician Discharge Summary  Patient ID: GOLDMAN BIRCHALL MRN: [id number] DOB/AGE: [birth year] 64 y.o.  Admit date: 04/13/2014 Discharge date: 05/05/2014  Discharge Diagnoses:  Principal Problem:   Acute left hemiparesis Active Problems:   PAF - Maze at the time of his CABG/AVR 5/14. Recurrent a fib On Amiodarone, DCCV this admit with recurrent a fib, now persistent a fib   Bilateral lower extremity edema, secondary to chronic CHF   COPD on home oxygen   Nontraumatic intracerebral hemorrhage   Altered sensation due to stroke   AKI (acute kidney injury)   Systolic CHF, chronic   Gait disturbance, post-stroke   Hypokalemia   Discharged Condition: Stable  Significant Diagnostic Studies: Dg Knee 1-2 Views Right  04/13/2014   CLINICAL DATA:  Chronic anterior right knee pain for 2 years. Initial encounter.  EXAM: RIGHT KNEE - 1-2 VIEW  COMPARISON:  None.  FINDINGS: There is no evidence of fracture or dislocation. The joint spaces are preserved. There is mild cortical irregularity at the lateral compartment, and marginal osteophytes are seen arising at the medial and lateral compartments. There is suggestion of mild chondrocalcinosis.  A small to moderate knee joint effusion is seen. Scattered clips are noted about the posterior aspect of the knee. Diffuse vascular calcifications are seen.  IMPRESSION: 1. No evidence of fracture or dislocation. 2. Small to moderate knee joint effusion noted. 3. Mild osteoarthritis seen, with suggestion of mild chondrocalcinosis. 4. Diffuse vascular calcifications noted.   Electronically Signed   By: Garald Balding M.D.   On: 04/13/2014 23:27   Ct Head Wo Contrast  05/03/2014   CLINICAL DATA:  Follow-up right basal ganglia hemorrhage.  EXAM: CT HEAD WITHOUT CONTRAST  TECHNIQUE: Contiguous axial images were obtained from the base of the skull through the vertex without intravenous contrast.  COMPARISON:  04/10/2014  FINDINGS: No evidence of parenchymal hemorrhage  or extra-axial fluid collection. No mass lesion, mass effect, or midline shift.  No CT evidence of acute infarction.  Hypodensity in the right basal ganglia/thalamus (series 21/image 17), reflecting encephalomalacic changes related to prior hemorrhage.  Additional encephalomalacic changes related to prior left parietal infarct.  Small vessel ischemic changes.  Intracranial atherosclerosis.  Mild global cortical atrophy.  No ventriculomegaly.  The visualized paranasal sinuses are essentially clear. The mastoid air cells are unopacified.  No evidence of calvarial fracture.  IMPRESSION: Hypodensity/encephalomalacic changes in the right basal ganglia/thalamus, related to prior hemorrhage.  Encephalomalacic changes related to prior left parietal infarct.  Atrophy with small vessel ischemic changes and intracranial atherosclerosis.   Electronically Signed   By: Julian Hy M.D.   On: 05/03/2014 10:23    Labs:  Basic Metabolic Panel: BMP Latest Ref Rng 05/05/2014 04/27/2014 04/26/2014  Glucose 70 - 99 mg/dL 87 149(H) 131(H)  BUN 6 - 23 mg/dL 19 32(H) 34(H)  Creatinine 0.50 - 1.35 mg/dL 1.34 1.40(H) 1.31  Sodium 135 - 145 mmol/L 137 135 132(L)  Potassium 3.5 - 5.1 mmol/L 3.4(L) 5.0 4.6  Chloride 96 - 112 mEq/L 101 102 99  CO2 19 - 32 mmol/L 30 27 29   Calcium 8.4 - 10.5 mg/dL 8.1(L) 8.6 8.2(L)     CBC: CBC Latest Ref Rng 04/09/2014 04/07/2014 04/07/2014  WBC 4.0 - 10.5 K/uL 6.1 - 6.9  Hemoglobin 13.0 - 17.0 g/dL 10.3(L) 12.6(L) 10.9(L)  Hematocrit 39.0 - 52.0 % 34.2(L) 37.0(L) 35.7(L)  Platelets 150 - 400 K/uL 177 - 215     CBG: No results for input(s): GLUCAP in the last  168 hours.  Brief HPI:   [name] is a 64 y.o. RH male with h/o HTN, COPD-oxygen dependent, A fib on Eliquis, s/p MAZE June 2014.  He was admitted on 04/07/2014 past with left-sided weakness and mild slurred speech. CCT showed a right IC basal ganglia hemorrhage with a 4 mm right to left midline shift . Eliquis was  reversed with Kcentra and BP monitored closely with recommendations to repeat CCT in 3-4 weeks to help determine ability to resume Eliquis. Repeat carotid dopplers done revealing 60- 79% L-ICA and 40- 59% R-ICA stenosis.Swallow evaluation done revealing dysphagia and patient placed on a mechanical soft nectar thick liquid diet. Therapy ongoing and CIR recommended by rehab team and MD. Patient with resultant left sided weakness with dysphagia and admitted to rehab today.    Hospital Course: JAHAN FRIEDLANDER was admitted to rehab 04/13/2014 for inpatient therapies to consist of PT, ST and OT at least three hours five days a week. Past admission physiatrist, therapy team and rehab RN have worked together to provide customized collaborative inpatient rehab.  Blood pressures were monitored on tid basis and have been reasonably controlled. He was maintained on dysphagia diet with nectar liquids but developed acute renal failure due to dehydration from poor po intake as well as hypokalemia. He was started on gently hydration as well as runs of potassium and cardiology was consulted for input. They recommended holding diuretics due to evidence of dehydration and digoxin was decreased to 0.125 mg every other day.  Renal failure has resolved and po intake has improved with advancement of liquids to thin.   Daily weights were ordered and have been on upward trend in the past few days. Low dose lisinopril as well as  zaroxolyn were resumed on 01/11 and recheck labs today reveal mild hypokalemia. He was started on potassium supplement and labs to be rechecked in am with results to Dr. Aundra Dubin.   Patient has had adjustment reaction with mild depression and was started on Remeron with improvement in symptoms. Dr. Vikki Ports (psychology) as well as Rehab team have provided ego support and encouragement during the stay. Patient's outlook, po intake as well as overall endurance have greatly improved.  Respiratory status has been  stable. Right knee X rays were done due to complaints of pain and reveal moderate OA. Voltaren gel has been used qid with tramadol additionally for chronic back and knee pain. Follow up CT of head was done on 1/10 showing resolution of bleed. Cardiology has expressed concerns that restarting anticoagulation would be high risk. Dr. Leonie Man was contacted for input and patient has been cleared to start low dose ASA on 05/04/14.   He continues to be impacted by left hemiparesis with sensory deficits but anxiety levels have decreased with increase in activity tolerance as well as progress. He currently requires min to moderate assistance with mobility and self care tasks. Family has elected on continued therapy at SNF. Bed is available at [facility] and he was discharged to this facility on 05/05/13   Rehab course: During patient's stay in rehab weekly team conferences were held to monitor patient's progress, set goals and discuss barriers to discharge. Patient has had improvement in activity tolerance, balance, postural control, as well as ability to compensate for deficits. He has had improvement in functional use RUE  and RLE as well as improving awareness and improvement in endurance levels. He is able to perform squat pivot transfers with min assist and tactile cues to weight  bear thorough LLE and LUE. He was able to ambulate 5 steps with EVA walker and mirror for feedback to promote awareness of posture.  He requires assistance with wheelchair mobility due to shoulder pain.  He requires moderate assist with bathing, supervision with upper body dressing and + 2 assist with Lower body dressing.  He is tolerating regular textures with thin liquids without s/s of aspiration. He has has improvement in recall and problem solving. He requires supervision fading to moderate independence familiar tasks.     Disposition: [place].   Diet: Heart Healthy. Low salt.  Special Instructions: 1. Check  weights daily. 2. Recheck BMET tomorrow and fax results to CHF clinic--Fax # [phone]. 3. Intermittent supervision at meals. Medications whole or half in puree. Slow rate. Small bites/sips.      Medication List    STOP taking these medications        apixaban 5 MG Tabs tablet  Commonly known as:  ELIQUIS     gabapentin 300 MG capsule  Commonly known as:  NEURONTIN     metolazone 2.5 MG tablet  Commonly known as:  ZAROXOLYN      TAKE these medications        albuterol 108 (90 BASE) MCG/ACT inhaler  Commonly known as:  PROVENTIL HFA;VENTOLIN HFA  Inhale 2 puffs into the lungs every 6 (six) hours as needed for wheezing or shortness of breath.     aspirin 81 MG EC tablet  Take 1 tablet (81 mg total) by mouth daily.     atorvastatin 80 MG tablet  Commonly known as:  LIPITOR  Take 1 tablet (80 mg total) by mouth daily at 6 PM.     budesonide-formoterol 80-4.5 MCG/ACT inhaler  Commonly known as:  SYMBICORT  Inhale 2 puffs into the lungs 2 (two) times daily.     carvedilol 12.5 MG tablet  Commonly known as:  COREG  Take 1 tablet (12.5 mg total) by mouth 2 (two) times daily with a meal.     diclofenac sodium 1 % Gel  Commonly known as:  VOLTAREN  Apply 2 g topically 4 (four) times daily. To right knee     digoxin 0.125 MG tablet  Commonly known as:  LANOXIN  Take 1 tablet (0.125 mg total) by mouth every other day.     folic acid 1 MG tablet  Commonly known as:  FOLVITE  Take 1 tablet (1 mg total) by mouth daily.     lisinopril 2.5 MG tablet  Commonly known as:  PRINIVIL,ZESTRIL  Take 1 tablet (2.5 mg total) by mouth daily.     mirtazapine 7.5 MG tablet  Commonly known as:  REMERON  Take 1 tablet (7.5 mg total) by mouth at bedtime.     OXYGEN  - Inhale 1-2 L into the lungs continuous. Is in the habit of not using his oxygen when he leaves the house.  - Uses 1L continuous normally  - Uses 2L for exertion     pantoprazole 40 MG tablet  Commonly known as:   PROTONIX  Take 1 tablet (40 mg total) by mouth daily.     polyethylene glycol packet  Commonly known as:  MIRALAX / GLYCOLAX  Take 17 g by mouth every other day.     potassium chloride SA 20 MEQ tablet  Commonly known as:  K-DUR,KLOR-CON  Take 1 tablet (20 mEq total) by mouth daily.     PRESCRIPTION MEDICATION  Apply 1 application topically daily.  senna-docusate 8.6-50 MG per tablet  Commonly known as:  Senokot-S  Take 2 tablets by mouth at bedtime.     spironolactone 25 MG tablet  Commonly known as:  ALDACTONE  Take 0.5 tablets (12.5 mg total) by mouth daily.     tiZANidine 4 MG tablet  Commonly known as:  ZANAFLEX  Take 1 tablet (4 mg total) by mouth 3 (three) times daily.     torsemide 20 MG tablet  Commonly known as:  DEMADEX  Take one daily in am     traMADol 50 MG tablet  Commonly known as:  ULTRAM  Take 1 tablet (50 mg total) by mouth every 6 (six) hours as needed for moderate pain.       Follow-up Information    Follow up with Xu,Jindong, MD. Call today.   Specialty:  Neurology   Why:  for follow up appointment 4 weeks.   Contact information:   [address] [place] [address] [phone]       Follow up with Charlett Blake, MD On 06/02/2014.   Specialty:  Physical Medicine and Rehabilitation   Why:  Be there at 11 for 11:30 am  appointment   Contact information:   [address] [place] [address] [phone]       Follow up with Loralie Champagne, MD On 05/19/2014.   Specialty:  Cardiology   Why:  Appointment at 10:20 am   Contact information:   [address]  [place] [address] [phone]       Signed: Bary Leriche 05/05/2014, 11:07 AM

## 2014-05-04 NOTE — Progress Notes (Signed)
Speech Language Pathology Daily Session Note  Patient Details  Name: [name] MRN: [id number] Date of Birth: May 01, 1950  Today's Date: 05/04/2014 SLP Individual Time: 0800-0900 SLP Individual Time Calculation (min): 60 min  Short Term Goals: Week 3: SLP Short Term Goal 1 (Week 3): Pt will demonstrate improved mastication for toleration of regular textures with Mod I  SLP Short Term Goal 2 (Week 3): Pt will complete oral motor exercises targeting left sided weakness to improve mastication and manipulation of regular solid boluses with supervision.  SLP Short Term Goal 3 (Week 3): Pt will improve recall of semi-complex information via use of compensatory strategies over 80% of observable opportunities with supervision cues.  SLP Short Term Goal 4 (Week 3): Pt will improve functional problem solving skills (i.e. planning, thought organization, and error awareness) during complex tasks over 80% of observable opportunities with supervision cues.   Skilled Therapeutic Interventions:  Pt was seen for skilled ST targeting cognitive goals.  Upon arrival, pt was reclined in bed, awake, alert, and agreeable to participate in [employer].  Pt directed his care with Mod I both prior to and during transfer from bed to wheelchair.  Once pt was seated upright in wheelchair, SLP facilitated the session with a semi-complex problem solving task targeting abstract reasoning, mental flexibility, and anticipatory awareness.  Pt completed the abovementioned task with initial supervision instructional cues which SLP was able to fade to mod I with increased task familiarity.  Furthermore, during functional conversations with SLP, pt spontaneously recalled multiple details (>5) from previous therapy session.  SLP provided extensive education related to pt's current progress towards meeting goals.  Pt is quickly approaching mod I goals for most semi-complex daily tasks; therefore, SLP does not anticipate the need for further ST  at discharge.  However, SLP will continue to provide ST while inpatient to complete patient education prior to discharge.  Continue per current plan of care.    FIM:  Comprehension Comprehension Mode: Auditory Comprehension: 6-Follows complex conversation/direction: With extra time/assistive device Expression Expression Mode: Verbal Expression: 6-Expresses complex ideas: With extra time/assistive device Social Interaction Social Interaction: 6-Interacts appropriately with others with medication or extra time (anti-anxiety, antidepressant). Problem Solving Problem Solving: 5-Solves basic 90% of the time/requires cueing < 10% of the time Memory Memory: 5-Recognizes or recalls 90% of the time/requires cueing < 10% of the time  Pain Pain Assessment Pain Assessment: No/denies pain   Therapy/Group: Individual Therapy  [name], Selinda Orion 05/04/2014, 3:50 PM

## 2014-05-04 NOTE — Progress Notes (Signed)
64 y.o. right handed male history of HTN, COPD-oxygen dependent, A fib on Eliquis, s/p MAZE June 2014. He was independent prior to admission, lives alone and was using a walker due to back pain.  He was admitted on 04/07/2014 past  with left-sided weakness and mild slurred speech. CCT showed a right IC basal ganglia hemorrhage with a 4 mm right to left midline shift and BP 158/85 at admission. Eliquis was  reversed with Kcentra and BP monitored closely. .  Repeat carotid dopplers done revealing 60- 79% L-ICA and 40- 59% R-ICA stenosis   Subjective/Complaints: Discussed CT head results  Review of Systems - otherwise negative  Objective: Vital Signs: Blood pressure 143/48, pulse 66, temperature 98 F (36.7 C), temperature source Oral, resp. rate 18, height 5\' 10"  (1.778 m), weight 97.3 kg (214 lb 8.1 oz), SpO2 99 %. Ct Head Wo Contrast  05/03/2014   CLINICAL DATA:  Follow-up right basal ganglia hemorrhage.  EXAM: CT HEAD WITHOUT CONTRAST  TECHNIQUE: Contiguous axial images were obtained from the base of the skull through the vertex without intravenous contrast.  COMPARISON:  04/10/2014  FINDINGS: No evidence of parenchymal hemorrhage or extra-axial fluid collection. No mass lesion, mass effect, or midline shift.  No CT evidence of acute infarction.  Hypodensity in the right basal ganglia/thalamus (series 21/image 17), reflecting encephalomalacic changes related to prior hemorrhage.  Additional encephalomalacic changes related to prior left parietal infarct.  Small vessel ischemic changes.  Intracranial atherosclerosis.  Mild global cortical atrophy.  No ventriculomegaly.  The visualized paranasal sinuses are essentially clear. The mastoid air cells are unopacified.  No evidence of calvarial fracture.  IMPRESSION: Hypodensity/encephalomalacic changes in the right basal ganglia/thalamus, related to prior hemorrhage.  Encephalomalacic changes related to prior left parietal infarct.  Atrophy with small vessel  ischemic changes and intracranial atherosclerosis.   Electronically Signed   By: Julian Hy M.D.   On: 05/03/2014 10:23   No results found for this or any previous visit (from the past 72 hour(s)).   HEENT: normal and O2 [place] Cardio: RRR and murmur Resp: CTA B/L and unlabored, no rales or wheezes GI: BS positive and NT, ND Extremity:  1+ pedal edema on left side only Skin:   Bruise multiple eccymotic areas in LUE and BLE Neuro: Alert/Oriented, Cranial Nerve II-XII normal, Abnormal Sensory reduced on Left, Abnormal Motor 3/5 LUE, 3- Left HF, 4/5 L KE, 3- L ankle DF and Abnormal FMC Ataxic on left side/ dec FMC, sensory absent on left side Musc/Skel:    No swelling or pain with AROM Right knee.   Gen NAD Psych: pleasant and appropriate.    Assessment/Plan: 1. Functional deficits secondary to right BG/IC hemorrhage with left hemiparesis and hemisensory loss which require 3+ hours per day of interdisciplinary therapy in a comprehensive inpatient rehab setting. Physiatrist is providing close team supervision and 24 hour management of active medical problems listed below. Physiatrist and rehab team continue to assess barriers to discharge/monitor patient progress toward functional and medical goals. Will need Neuro/cards advise on anticoagulation prior to D/C Per last cardiology note some question as to whether Eliquis should restart  FIM: FIM - Bathing Bathing Steps Patient Completed: Chest, Right Arm, Left Arm, Abdomen, Right upper leg, Left upper leg, Front perineal area Bathing: 3: Mod-Patient completes 5-7 24f 10 parts or 50-74%  FIM - Upper Body Dressing/Undressing Upper body dressing/undressing steps patient completed: Thread/unthread right sleeve of pullover shirt/dresss, Thread/unthread left sleeve of pullover shirt/dress Upper body dressing/undressing: 5: Supervision:  Safety issues/verbal cues FIM - Lower Body Dressing/Undressing Lower body dressing/undressing steps patient  completed: Thread/unthread right pants leg, Thread/unthread left pants leg Lower body dressing/undressing: 1: Two helpers  FIM - Musician Devices: Grab bar or rail for support Toileting: 1: Two helpers  FIM - Radio producer Devices: Recruitment consultant Transfers: 4-To toilet/BSC: Min A (steadying Pt. > 75%), 4-From toilet/BSC: Min A (steadying Pt. > 75%)  FIM - Bed/Chair Transfer Bed/Chair Transfer Assistive Devices: Arm rests Bed/Chair Transfer: 4: Chair or W/C > Bed: Min A (steadying Pt. > 75%), 4: Bed > Chair or W/C: Min A (steadying Pt. > 75%)  FIM - Locomotion: Wheelchair Distance: 75 Locomotion: Wheelchair: 1: Total Assistance/staff pushes wheelchair (Pt<25%) FIM - Locomotion: Ambulation Locomotion: Ambulation Assistive Devices: Orthosis, Other (comment) (bilat knee orthoses) Ambulation/Gait Assistance: 1: +2 Total assist Locomotion: Ambulation: 0: Activity did not occur  Comprehension Comprehension Mode: Auditory Comprehension: 5-Follows basic conversation/direction: With extra time/assistive device  Expression Expression Mode: Verbal Expression: 5-Expresses basic needs/ideas: With extra time/assistive device  Social Interaction Social Interaction Mode: Asleep Social Interaction: 6-Interacts appropriately with others with medication or extra time (anti-anxiety, antidepressant).  Problem Solving Problem Solving: 5-Solves basic problems: With no assist  Memory Memory: 5-Recognizes or recalls 90% of the time/requires cueing < 10% of the time  Medical Problem List and Plan: 1. Functional deficits secondary to right BG/IC hemorrhage with left hemiparesis and hemisensory loss, Check follow up CT head and then will need Neuro and cardiology to weigh in on resuming Eliquis vs another agent    2.  DVT Prophylaxis/Anticoagulation: Continue SCDs. Add TEDs. Monitor for any signs of DVT. 3. Pain Management/chronic back pain:  Tylenol as needed for pain. Add voltaren gel. Continue Zanaflex 8 mg twice a day 4. Dysphagia. Dysphagia 3 nectar liquids. Adv per slp 5. Neuropsych: This patient is capable of making decisions on her own behalf. 6. Skin/Wound Care: Routine skin checks 7. Fluids/Electrolytes/Nutrition: improved intake. Off ivf. bmet stable 8. Hypertension/CV. Monitor every 8 hours. Continue Coreg 12.5 mg twice a day, Aldactone 25 mg daily, Demadex as directed, Zaroxolyn 2.5 mg Monday Wednesday Friday.   -continue to hold diuretics except for aldactone  -potassium  supplementation    -po intake much improved  -appreciate cards follow up  -follow up bmet overall improved 9. Atrial fibrillation/status post Maze procedure June 2014: Eliquis on hold secondary to hemorrhage. Continue Lanoxin 0.25 mg every other day.Dig level low but HR is low as well  Monitor HR tid. Cont Coreg HR in 60s  10. Hyperlipidemia. Lipitor 11. History of gouty arthritis: no active joint involvement held colchicine due to diarrhea . 12. COPD: Respiratory status stable. Continue Symbicort twice daily. 13. H/o  Peripheral edema with weeping:   Low salt diet.     14. Constipation: ----improved   LOS (Days) 21 A FACE TO FACE EVALUATION WAS PERFORMED  KIRSTEINS,ANDREW E 05/04/2014, 7:36 AM

## 2014-05-04 NOTE — Progress Notes (Addendum)
Physical Therapy Session Note  Patient Details  Name: [name] MRN: [id number] Date of Birth: Jun 21, 1950  Today's Date: 05/04/2014 PT Individual Time: 1330-1400 PT Individual Time Calculation (min): 30 min  and Today's Date: 05/04/2014 PT Co-Treatment Time: 1130-1200 (Co-tx with S[facility]; entire session from 1100-1200) and 1330-1400 PT Co-Treatment Time Calculation (min): 30 min (co-treatment)  Short Term Goals: Week 3:  PT Short Term Goal 1 (Week 3): STG's = LTG's secondary to anticipated LOS. Awaiting SNF placement.  Skilled Therapeutic Interventions/Progress Updates:    Treatment Session 1: Co-treatment with primary OT [facility]) focusing on self-care, functional transfers, standing balance, and gait training. See OT note for further detail in reference to self-care. Pt performed multiple sit<>stand transfers from w/c with LUE at sink, tactile cueing at L knee for increased proprioceptive input, and max A overall to control ascent into standing. Pt performed dynamic standing with bilat UE support at sink and mod A of OT (positioned at pt's L side), min guard to min A of PT (positioned at pt's R), and manual repositioning of LLE for neutral hip/tibial rotation.  Transported pt to hallway in w/c with total A for energy conservation. Pt performed gait x5' in controlled environment with EVA walker (for LUE weight bearing, increased proprioceptive input, kinesthetic awareness) and +3A with OT positioned at pt's L side providing manual stabilization of LUE at EVA walker and manual facilitation at L axilla for upright posture; PT positioned at pt's R side providing overall stability, manually advancing EVA walker, and provided verbal cueing for increased RLE step length. Mirror positioned anterior to pt for visual feedback. Trial ended due to fatigue, onset of L knee buckling. Session ended in pt room, where pt was left seated in w/c with half lap tray on, 1.5 L/min supplemental O2, [place] in place, and all  needs within reach.  Treatment Session 2: Pt received seated in w/c wearing bilat knee orthoses; agreeable to therapy. Session focused on functional transfers, ambulation. Pt performed gait x10' in controlled environment with EVA walker and +2A; PT positioned at pt's L side providing manual stabilization of LUE on walker. Assist of additional person required for w/c follow and O2 tank management. Pt required assist for overall stability, upright posture, manual advancement of EVA walker. Pt effectively utilized verbal/tactile cueing for LLE step placement (due to tendency toward excessive LLE adduction). Trial ended due to onset of fatigue, L knee buckling.  Noted pants and w/c seat cushion soiled with urine. Pt reporting having spilled urinal prior to PT session. Therefore, transported pt back to room, where pt performed sit<>stand x2 trials with mod A for doffing shorts then donning clean pants. Manual stabilization of L hand on sink provided during transfer for increased weight bearing, proprioception, and for attention to/protection of LUE during transfer. W/c seat cushion changed. Educated pt on importance of notifying staff if clothing is wet to preserve skin integrity. Pt verbally agreed. Departed with pt seated in w/c with half lap tray on and all needs within reach.  Therapy Documentation Precautions:  Precautions Precautions: Fall Restrictions Weight Bearing Restrictions: No Vital Signs: Therapy Vitals Pulse Rate: (!) 59 BP: (!) 100/52 mmHg Pain: Pain Assessment Pain Assessment: No/denies pain Pain Score: 3  Pain Type: Chronic pain Pain Location: Shoulder Pain Orientation: Left Pain Descriptors / Indicators: Aching Pain Frequency: Intermittent Pain Onset: Gradual Patients Stated Pain Goal: 3 Pain Intervention(s): Medication (See eMAR);Repositioned;Emotional support (Medicating prior to therapy) Multiple Pain Sites: No Locomotion : Ambulation Ambulation/Gait Assistance: 1: +2  Total assist;2: Max assist;Other (comment) (+3 for w/c follow)   See FIM for current functional status  Therapy/Group: Individual Therapy and Co-Treatment  Hobble, Malva Cogan 05/04/2014, 8:04 PM

## 2014-05-04 NOTE — Progress Notes (Signed)
Dr. Leonie Man has evaluated films and recommends starting low dose ASA for secondary stroke prevention. Patient and family updated.

## 2014-05-05 ENCOUNTER — Inpatient Hospital Stay (HOSPITAL_COMMUNITY): Payer: BLUE CROSS/BLUE SHIELD | Admitting: Occupational Therapy

## 2014-05-05 ENCOUNTER — Inpatient Hospital Stay (HOSPITAL_COMMUNITY): Payer: BLUE CROSS/BLUE SHIELD

## 2014-05-05 ENCOUNTER — Inpatient Hospital Stay (HOSPITAL_COMMUNITY): Payer: 59 | Admitting: Speech Pathology

## 2014-05-05 DIAGNOSIS — E876 Hypokalemia: Secondary | ICD-10-CM

## 2014-05-05 LAB — BASIC METABOLIC PANEL
Anion gap: 6 (ref 5–15)
BUN: 19 mg/dL (ref 6–23)
CHLORIDE: 101 meq/L (ref 96–112)
CO2: 30 mmol/L (ref 19–32)
Calcium: 8.1 mg/dL — ABNORMAL LOW (ref 8.4–10.5)
Creatinine, Ser: 1.34 mg/dL (ref 0.50–1.35)
GFR calc non Af Amer: 54 mL/min — ABNORMAL LOW (ref >90)
GFR, EST AFRICAN AMERICAN: 63 mL/min — AB (ref >90)
Glucose, Bld: 87 mg/dL (ref 70–99)
POTASSIUM: 3.4 mmol/L — AB (ref 3.5–5.1)
SODIUM: 137 mmol/L (ref 135–145)

## 2014-05-05 MED ORDER — POTASSIUM CHLORIDE CRYS ER 20 MEQ PO TBCR
20.0000 meq | EXTENDED_RELEASE_TABLET | Freq: Every day | ORAL | Status: DC
  Administered 2014-05-05: 20 meq via ORAL
  Filled 2014-05-05: qty 1

## 2014-05-05 NOTE — Progress Notes (Signed)
Physical Therapy Discharge Summary  Patient Details  Name: [name] MRN: [id number] Date of Birth: March 27, 1951  Today's Date: 05/05/2014 PT Individual Time: 1300-1400 PT Individual Time Calculation (min): 60 min    Patient has met 3 of 4 long term goals due to improved activity tolerance, improved balance, improved postural control, increased strength, decreased pain, ability to compensate for deficits, functional use of  left upper extremity and left lower extremity and improved awareness.  Patient to discharge at a wheelchair level [place].   Patient's care partner unavailable to provide the necessary physical and cognitive assistance at discharge. Necessary assistance will therefore be provided at next venue of care.  Reasons goals not met: Long term goal addressing ambulation not met secondary to patient requiring +2A for ambulation (as opposed to Total A, as stated in goal).  Recommendation:  Patient will benefit from ongoing skilled PT services in skilled nursing facility setting to continue to advance safe functional mobility, address ongoing impairments in LUE/LLE attention, stability/independence with functional mobility and to minimize fall risk.  Equipment: bilat knee orthoses; all other equipment to be provided at next venue of care  Reasons for discharge: treatment goals met and discharge from hospital  Patient/family agrees with progress made and goals achieved: Yes  PT Discharge Precautions/Restrictions Precautions Precautions: Fall Precaution Comments: LUE inattention Required Braces or Orthoses: Other Brace/Splint Other Brace/Splint: For standing, functional ambulation, recommend hyperextension brace on R knee and hinged brace on L knee. Restrictions Weight Bearing Restrictions: No Vital Signs Therapy Vitals Temp: 98.4 F (36.9 C) Temp Source: Oral Pulse Rate: 64 Resp: 18 BP: (!) 135/42 mmHg Patient Position (if appropriate): Sitting Oxygen  Therapy SpO2: 97 % O2 Device: Nasal Cannula O2 Flow Rate (L/min): 2 L/min Pulse Oximetry Type: Intermittent Pain Pain Assessment Pain Assessment: No/denies pain Vision/Perception  Vision - Assessment Eye Alignment: Within Functional Limits Alignment/Gaze Preference: Within Defined Limits Perception Comments: Pt with impaired midline awareness due to sensory and proprioception deficits  Cognition Overall Cognitive Status: Impaired/Different from baseline Arousal/Alertness: Awake/alert Orientation Level: Oriented X4 Attention: Alternating Alternating Attention: Appears intact Memory: Impaired Memory Impairment: Decreased recall of new information Awareness: Appears intact Problem Solving: Impaired Problem Solving Impairment: Functional complex Safety/Judgment: Appears intact Sensation Sensation Light Touch: Impaired Detail Light Touch Impaired Details: Impaired LUE;Impaired LLE Hot/Cold: Impaired by gross assessment Hot/Cold Impaired Details: Impaired LUE (Able to detect cold temperature in LUE but unable to localize) Proprioception: Impaired Detail Proprioception Impaired Details: Impaired LUE;Impaired LLE Additional Comments: Pt able to detect but unable to localize light touch in LUE (distal > proximal, per pt). LLE proprioception impaired (but no longer absent, as on evaluation).  Coordination Gross Motor Movements are Fluid and Coordinated: No Fine Motor Movements are Fluid and Coordinated: No Coordination and Movement Description: Fluidity of movement limited by decreased LUE/LLE proprioception, motor control. Motor  Motor Motor: Abnormal postural alignment and control;Hemiplegia  Mobility Bed Mobility Bed Mobility: Sitting - Scoot to Edge of Bed;Sit to Supine;Supine to Sit Supine to Sit: 5: Supervision;HOB flat Supine to Sit Details: Verbal cues for technique;Tactile cues for sequencing;Verbal cues for sequencing Sitting - Scoot to Edge of Bed: 5:  Supervision Sitting - Scoot to [employer] of Bed Details: Verbal cues for precautions/safety Sitting - Scoot to Edge of Bed Details (indicate cue type and reason): Verbal cueing for attention to LUE. Sit to Supine: 5: Supervision;HOB flat Sit to Supine - Details: Verbal cues for technique Sit to Supine - Details (indicate cue type and reason): Pt occasionally  requires verbal cueing required for setup. Transfers Transfers: Yes Sit to Stand: 4: Min assist;From chair/3-in-1;With armrests Sit to Stand Details: Tactile cues for weight beaing Sit to Stand Details (indicate cue type and reason): Tactile cueing at LUE (placed at sink) for weight bearing, increased proprioceptive input. Stand to Sit: 4: Min assist;To chair/3-in-1;With armrests Stand to Sit Details (indicate cue type and reason): Tactile cues for weight beaing Stand to Sit Details: Tactile cueing as described above for sit > stand. Squat Pivot Transfers: 4: Min Risk manager Details: Tactile cues for weight beaing;Verbal cues for precautions/safety Squat Pivot Transfer Details (indicate cue type and reason): Verbal/tactile cueing for increased attention to LUE. Locomotion  Ambulation Ambulation: Yes Ambulation/Gait Assistance: 1: +2 Total assist;Other (comment);2: Max assist Ambulation Distance (Feet): 10 Feet Assistive device: Other (Comment);[place] walker (EVA walker then 3 musketeers) Ambulation/Gait Assistance Details: Verbal cues for gait pattern;Tactile cues for posture;Manual facilitation for placement;Manual facilitation for weight shifting Ambulation/Gait Assistance Details: Pt ambulated x10' with EVA walker and +3A; therapist on R side providing mod A, therapist on L side providing min A, and additional person required for w/c follow and O2 management.. Pt able to utilize mirror and verbal cueing to self-correct L step placement. Pt later performed gait x10' with +3A (3 musketeers assist with additional person for w/c  follow and O2 management). During second trial, pt required manual redirection of LLE placement. Gait Gait: Yes Gait Pattern: Impaired Gait Pattern: Trunk flexed;Decreased dorsiflexion - left;Step-to pattern;Decreased stance time - right;Decreased step length - left (Decreased control of LLE placement during LLE advancement.) Stairs / Additional Locomotion Stairs: Yes Stairs Assistance: 1: +2 Total assist Stairs Assistance Details: Verbal cues for technique;Tactile cues for weight beaing;Manual facilitation for weight shifting;Verbal cues for sequencing Stairs Assistance Details (indicate cue type and reason): Pt negotiated single step with bilat rails and step-to pattern, forward-facing to ascend and backwards to descend. Stair Management Technique: Two rails;Forwards;Backwards;Step to pattern Number of Stairs: 1 Height of Stairs: 4.5 Ramp: Not tested (comment) Curb: 1: +2 Total assist (See step negotiation above.) Wheelchair Mobility Wheelchair Mobility: Yes Wheelchair Assistance: 5: Supervision Wheelchair Assistance Details: Tactile cues for weight beaing;Other (comment) (Tactile cueing at R knee for increased RLE weightbearing.) Wheelchair Propulsion: Right upper extremity;Right lower extremity Wheelchair Parts Management: Supervision/cueing Distance: 150  Trunk/Postural Assessment  Cervical Assessment Cervical Assessment: Within Functional Limits Thoracic Assessment Thoracic Assessment: Within Functional Limits Lumbar Assessment Lumbar Assessment: Within Functional Limits Postural Control Postural Control: Deficits on evaluation Trunk Control: Pt consistently demonstrates midline trunk alignment in seated. In standing, pt able to achieve midline without cueing with bilat UE support.  Balance Balance Balance Assessed: Yes Dynamic Sitting Balance Dynamic Sitting - Balance Support: Feet supported;During functional activity;No upper extremity supported Dynamic Sitting - Level of  Assistance: 6: Modified independent (Device/Increase time) Dynamic Sitting - Balance Activities: Forward lean/weight shifting;Lateral lean/weight shifting;Reaching for objects Dynamic Standing Balance Dynamic Standing - Balance Support: Left upper extremity supported;During functional activity Dynamic Standing - Level of Assistance: 4: Min assist Extremity Assessment  RUE Assessment RUE Assessment: Within Functional Limits LUE Assessment LUE Assessment: Exceptions to WFL LUE AROM (degrees) LUE Overall AROM Comments: deficits, pt able to acheive full shoulder flexion with increased time and recruiting abduction to acquire.  Impaired due to decreased proprioception and motor control LUE Strength LUE Overall Strength Comments: strength grossly 3/5 overall RLE Assessment RLE Assessment: Within Functional Limits LLE Assessment LLE Assessment: Exceptions to [facility] LLE Strength LLE Overall Strength: Deficits Left Hip Flexion:  4-/5 Left Knee Flexion: 4/5 Left Knee Extension: 4-/5 Left Ankle Dorsiflexion: 4-/5 Left Ankle Plantar Flexion: 4/5  See FIM for current functional status  Inola Lisle, Malva Cogan 05/05/2014, 4:19 PM

## 2014-05-05 NOTE — Progress Notes (Signed)
64 y.o. right handed male history of HTN, COPD-oxygen dependent, A fib on Eliquis, s/p MAZE June 2014. He was independent prior to admission, lives alone and was using a walker due to back pain.  He was admitted on 04/07/2014 past  with left-sided weakness and mild slurred speech. CCT showed a right IC basal ganglia hemorrhage with a 4 mm right to left midline shift and BP 158/85 at admission. Eliquis was  reversed with Kcentra and BP monitored closely. .  Repeat carotid dopplers done revealing 60- 79% L-ICA and 40- 59% R-ICA stenosis   Subjective/Complaints: Appreciate Neuro note  Review of Systems - otherwise negative  Objective: Vital Signs: Blood pressure 135/42, pulse 64, temperature 98.4 F (36.9 C), temperature source Oral, resp. rate 18, height '5\' 10"'  (1.778 m), weight 101.7 kg (224 lb 3.3 oz), SpO2 97 %. Ct Head Wo Contrast  05/03/2014   CLINICAL DATA:  Follow-up right basal ganglia hemorrhage.  EXAM: CT HEAD WITHOUT CONTRAST  TECHNIQUE: Contiguous axial images were obtained from the base of the skull through the vertex without intravenous contrast.  COMPARISON:  04/10/2014  FINDINGS: No evidence of parenchymal hemorrhage or extra-axial fluid collection. No mass lesion, mass effect, or midline shift.  No CT evidence of acute infarction.  Hypodensity in the right basal ganglia/thalamus (series 21/image 17), reflecting encephalomalacic changes related to prior hemorrhage.  Additional encephalomalacic changes related to prior left parietal infarct.  Small vessel ischemic changes.  Intracranial atherosclerosis.  Mild global cortical atrophy.  No ventriculomegaly.  The visualized paranasal sinuses are essentially clear. The mastoid air cells are unopacified.  No evidence of calvarial fracture.  IMPRESSION: Hypodensity/encephalomalacic changes in the right basal ganglia/thalamus, related to prior hemorrhage.  Encephalomalacic changes related to prior left parietal infarct.  Atrophy with small vessel  ischemic changes and intracranial atherosclerosis.   Electronically Signed   By: Julian Hy M.D.   On: 05/03/2014 10:23   Results for orders placed or performed during the hospital encounter of 04/13/14 (from the past 72 hour(s))  Basic metabolic panel     Status: Abnormal   Collection Time: 05/05/14  4:58 AM  Result Value Ref Range   Sodium 137 135 - 145 mmol/L    Comment: Please note change in reference range.   Potassium 3.4 (L) 3.5 - 5.1 mmol/L    Comment: Please note change in reference range.   Chloride 101 96 - 112 mEq/L   CO2 30 19 - 32 mmol/L   Glucose, Bld 87 70 - 99 mg/dL   BUN 19 6 - 23 mg/dL   Creatinine, Ser 1.34 0.50 - 1.35 mg/dL   Calcium 8.1 (L) 8.4 - 10.5 mg/dL   GFR calc non Af Amer 54 (L) >90 mL/min   GFR calc Af Amer 63 (L) >90 mL/min    Comment: (NOTE) The eGFR has been calculated using the CKD EPI equation. This calculation has not been validated in all clinical situations. eGFR's persistently <90 mL/min signify possible Chronic Kidney Disease.    Anion gap 6 5 - 15     HEENT: normal and O2 [place] Cardio: RRR and murmur Resp: CTA B/L and unlabored, no rales or wheezes GI: BS positive and NT, ND Extremity:  1+ pedal edema on left side only Skin:   Bruise multiple eccymotic areas in LUE and BLE Neuro: Alert/Oriented, Cranial Nerve II-XII normal, Abnormal Sensory reduced on Left, Abnormal Motor 3/5 LUE, 3- Left HF, 4/5 L KE, 3- L ankle DF and Abnormal FMC Ataxic on  left side/ dec FMC, sensory absent on left side Musc/Skel:    No swelling or pain with AROM Right knee.   Gen NAD Psych: pleasant and appropriate.    Assessment/Plan: 1. Functional deficits secondary to right BG/IC hemorrhage with left hemiparesis and hemisensory loss  Now on ASA per neuro Stable for D/C to NH  FIM: FIM - Bathing Bathing Steps Patient Completed: Chest, Right Arm, Left Arm, Abdomen, Right upper leg, Left upper leg, Front perineal area Bathing: 3: Mod-Patient completes 5-7  29f10 parts or 50-74%  FIM - Upper Body Dressing/Undressing Upper body dressing/undressing steps patient completed: Thread/unthread right sleeve of pullover shirt/dresss, Thread/unthread left sleeve of pullover shirt/dress, Put head through opening of pull over shirt/dress, Pull shirt over trunk Upper body dressing/undressing: 5: Supervision: Safety issues/verbal cues FIM - Lower Body Dressing/Undressing Lower body dressing/undressing steps patient completed: Thread/unthread right pants leg, Thread/unthread left pants leg Lower body dressing/undressing: 1: Two helpers  FIM - TMusicianDevices: Grab bar or rail for support Toileting: 1: Two helpers  FIM - TRadio producerDevices: BRecruitment consultantTransfers: 4-To toilet/BSC: Min A (steadying Pt. > 75%), 4-From toilet/BSC: Min A (steadying Pt. > 75%)  FIM - Bed/Chair Transfer Bed/Chair Transfer Assistive Devices: Arm rests Bed/Chair Transfer: 4: Chair or W/C > Bed: Min A (steadying Pt. > 75%), 4: Bed > Chair or W/C: Min A (steadying Pt. > 75%)  FIM - Locomotion: Wheelchair Distance: 75 Locomotion: Wheelchair: 1: Total Assistance/staff pushes wheelchair (Pt<25%) FIM - Locomotion: Ambulation Locomotion: Ambulation Assistive Devices: Orthosis, Other (comment) (bilat knee orthoses) Ambulation/Gait Assistance: 1: +2 Total assist, Other (comment), 3: Mod assist (+2A for w/c follow and O2 management) Locomotion: Ambulation: 1: Two helpers  Comprehension Comprehension Mode: Auditory Comprehension: 6-Follows complex conversation/direction: With extra time/assistive device  Expression Expression Mode: Verbal Expression: 6-Expresses complex ideas: With extra time/assistive device  Social Interaction Social Interaction Mode: Asleep Social Interaction: 6-Interacts appropriately with others with medication or extra time (anti-anxiety, antidepressant).  Problem Solving Problem Solving:  5-Solves basic 90% of the time/requires cueing < 10% of the time  Memory Memory: 5-Recognizes or recalls 90% of the time/requires cueing < 10% of the time  Medical Problem List and Plan: 1. Functional deficits secondary to right BG/IC hemorrhage with left hemiparesis and hemisensory loss, Check follow up CT head and then will need Neuro and cardiology to weigh in on resuming Eliquis vs another agent    2.  DVT Prophylaxis/Anticoagulation: Continue SCDs. Add TEDs. Monitor for any signs of DVT. 3. Pain Management/chronic back pain: Tylenol as needed for pain. Add voltaren gel. Continue Zanaflex 8 mg twice a day 4. Dysphagia. Dysphagia 3 nectar liquids. Adv per slp 5. Neuropsych: This patient is capable of making decisions on her own behalf. 6. Skin/Wound Care: Routine skin checks 7. Fluids/Electrolytes/Nutrition: improved intake. Off ivf. bmet stable 8. Hypertension/CV. Monitor every 8 hours. Continue Coreg 12.5 mg twice a day, Aldactone 25 mg daily, Demadex as directed, Zaroxolyn 2.5 mg Monday Wednesday Friday.   -continue to hold diuretics except for aldactone  -potassium  supplementation    -po intake much improved  -appreciate cards follow up  -follow up bmet overall improved 9. Atrial fibrillation/status post Maze procedure June 2014: Eliquis on hold secondary to hemorrhage. Continue Lanoxin 0.25 mg every other day.Dig level low but HR is low as well  Monitor HR tid. Cont Coreg HR in 60s  10. Hyperlipidemia. Lipitor 11. History of gouty arthritis: no active joint involvement held  colchicine due to diarrhea . 12. COPD: Respiratory status stable. Continue Symbicort twice daily. 13. H/o  Peripheral edema with weeping:   Low salt diet.     14. Constipation: ----improved   LOS (Days) 22 A FACE TO FACE EVALUATION WAS PERFORMED  [name] E 05/05/2014, 6:53 AM

## 2014-05-05 NOTE — Progress Notes (Signed)
Speech Language Pathology Discharge Summary  Patient Details  Name: [name] MRN: [id number] Date of Birth: May 14, 1950  Today's Date: 05/05/2014 SLP Individual Time: 1150-1210 SLP Individual Time Calculation (min): 20 min   Skilled Therapeutic Interventions:  Pt was seen for skilled ST targeting education and cognitive goals prior to discharge.  Upon arrival, pt was seated upright in wheelchair, awake, alert, and agreeable to participate in [place].  SLP facilitated the session with functional conversations targeting verbal reasoning and anticipatory awareness.  Pt was mod I for identifying current physical and cognitive impairments and then anticipating problems that could arise as a result of those deficits.  Furthermore, pt was mod I for verbally problem solving solutions to the abovementioned scenarios.  SLP reviewed and reinforced recommendations for use of compensatory strategies to maximize functional independence during cognitive tasks at next level of care.  Pt endorses that he feels that he is nearing his cognitive baseline but is not there yet.  Pt requested ST follow at next venue of care.  SLP also reiterated skilled education for pt advocating for himself at next venue to continue progression in therapies.      Patient has met 5 of 5 long term goals.  Patient to discharge at overall Modified Independent level.  Reasons goals not met: n/a   Clinical Impression/Discharge Summary:  Pt made functional gains while inpatient and is discharging having met 5 out of 5 long term goals due to improved tolerance of thin liquids and mastication of regular solids, improved functional problem solving, and improved recall of complex information.  Pt is currently Mod I for most cognitive tasks and utilizes his recommended swallowing precautions to minimize overt s/s of aspiration with regular solids and thin liquids.  While pt is Mod I for cognition, pt reports that he is not yet returned to his  cognitive baseline and would prefer to have ST follow up at next level of care to continue cognitive remediation/compensation during higher level tasks.  Pt is discharging to SNF, where he may benefit from brief ST follow up for cognition.  Education is complete at this time.    Care Partner:  Caregiver Able to Provide Assistance: Other (comment) (SNF)  Type of Caregiver Assistance:  (SNF)  Recommendation:  Skilled Nursing facility  Rationale for SLP Follow Up: Maximize cognitive function and independence;Reduce caregiver burden   Equipment: none recommended by SLP    Reasons for discharge: Discharged from hospital   Patient/Family Agrees with Progress Made and Goals Achieved: Yes   See FIM for current functional status  [name] 05/05/2014, 12:39 PM

## 2014-05-05 NOTE — Progress Notes (Signed)
Occupational Therapy Discharge Summary  Patient Details  Name: [name] MRN: [id number] Date of Birth: 26-Sep-1950   Patient has met 9 of 11 long term goals due to improved activity tolerance, improved balance, postural control, functional use of  LEFT upper extremity, improved awareness and improved coordination.  Patient to discharge at [facility] Assist level.  Patient's care partner unavailable to provide the necessary physical assistance at discharge.    Reasons goals not met: Pt continues to require assist with opening containers for self-feeding therefore did not reach mod I level.  Also pt continues to require +2 assist with clothing management and hygiene with toileting due to decreased motor control and balance as well as increased fear of falling in standing.    Recommendation:  Patient will benefit from ongoing skilled OT services in skilled nursing facility setting to continue to advance functional skills in the area of BADL and Reduce care partner burden.  Equipment: No equipment provided  Reasons for discharge: treatment goals met and discharge from hospital  Patient/family agrees with progress made and goals achieved: Yes  OT Discharge Precautions/Restrictions  Precautions Precautions: Fall Precaution Comments: LUE inattention Required Braces or Orthoses: Other Brace/Splint Other Brace/Splint: For standing, functional ambulation, recommend hyperextension brace on R knee and hinged brace on L knee. General   Vital Signs Therapy Vitals Temp: 98.4 F (36.9 C) Temp Source: Oral Pulse Rate: 67 Resp: 18 BP: (!) 139/42 mmHg Patient Position (if appropriate): Sitting Oxygen Therapy SpO2: 100 % O2 Device: Nasal Cannula Pain Pain Assessment Pain Assessment: No/denies pain Pain Score: 4  Pain Intervention(s): Medication (See eMAR) ADL  See FIM Vision/Perception  Vision- History Baseline Vision/History: Wears glasses;Cataracts Wears Glasses: At all  times Patient Visual Report: No change from baseline Vision- Assessment Vision Assessment?: No apparent visual deficits Eye Alignment: Within Functional Limits Alignment/Gaze Preference: Within Defined Limits Visual Fields: No apparent deficits Perception Comments: Pt with impaired midline awareness due to sensory and proprioception deficits  Cognition Overall Cognitive Status: Impaired/Different from baseline Arousal/Alertness: Awake/alert Orientation Level: Oriented X4 Attention: Alternating Alternating Attention: Appears intact Memory: Impaired Memory Impairment: Decreased recall of new information Awareness: Appears intact Problem Solving: Impaired Problem Solving Impairment: Functional complex Safety/Judgment: Appears intact Sensation Sensation Light Touch: Impaired Detail Light Touch Impaired Details: Impaired LUE;Impaired LLE Stereognosis: Impaired Detail Stereognosis Impaired Details: Impaired LUE Hot/Cold: Impaired by gross assessment Proprioception: Impaired Detail Proprioception Impaired Details: Impaired LUE;Impaired LLE Additional Comments: Pt able to detect but unable to localize light touch in LUE (distal > proximal, per pt). LLE proprioception impaired (but no longer absent, as on evaluation).  Coordination Gross Motor Movements are Fluid and Coordinated: No Fine Motor Movements are Fluid and Coordinated: No Coordination and Movement Description: Fluidity of movement limited by decreased LUE/LLE proprioception, motor control. Finger Nose Finger Test: Decreased on Lt due to impaired proprioception and motor control Extremity/Trunk Assessment RUE Assessment RUE Assessment: Within Functional Limits LUE Assessment LUE Assessment: Exceptions to WFL LUE AROM (degrees) LUE Overall AROM Comments: deficits, pt able to acheive full shoulder flexion with increased time and recruiting abduction to acquire.  Impaired due to decreased proprioception and motor control LUE  Strength LUE Overall Strength Comments: strength grossly 3/5 overall  See FIM for current functional status  Nemesio Castrillon, [facility] 05/05/2014, 3:22 PM

## 2014-05-05 NOTE — Progress Notes (Signed)
Patient ID: [name], male   DOB: December 07, 1950, 64 y.o.   MRN: [id number] Advanced Heart Failure Rounding Note   Subjective:   [name] is 64 y.o. male with past medical history of severe aortic stenosis s/p tissue AV replacement (09/2012) atrial fibrillation s/p MAZE (09/2012) chronic combined systolic/diastolic HF, RV failure, HTN, PAD s/p L iliac stent in 2010 and aortobifemoral bypass graft, ETOH abuse, severe COPD with cor pulmonale, CAD s/p CABG with AV replacement and back pain from spinal stenosis. He also has a history of acute renal failure complicated by listeria bacteremia and Afib RVR 09/2013. Marland KitchenHe had TEE DC-CV but went back into afib with controlled rate on amiodarone and eliquis.   Admitted to [facility] 04/07/14 with left sided weakness CT of head showed right basal ganglial ICH with interventricular extension and 95mm of midline shift noted. Anticoagulants reversed. Plan to reconsider anticoagulants in 3-4 weeks if CT head is normal. Once stable, he was transferred to CIR. He was admitted to [facility] 04/13/14. Has been followed closely by PT/OT for ongoing therapry. Has ongoing L hemiparesis and hemisensory loss.   He got IV fluids due to elevated creatinine, now off. Renal function trending back down. Weight up 10 Pounds today. Diuretics restarted as well as lisinopril.    Denies SOB/Orthopnea .   04/09/14 Creatinine1.35 BUN 18 04/21/14 Creatinine 2.0 BUN 119  04/22/14 Creatinine 1.97 K 3.1 Dig level 1.2  04/23/14 Creatinine 1.45 K 3.4 NA 136  04/24/13 creatinine 1.35 04/27/13 K 5.0 Creatinine 1.4  05/05/13: K 3.4 Creatinine 1.34   Objective:   Weight Range:  Vital Signs:   Temp:  [97.8 F (36.6 C)-98.4 F (36.9 C)] 98.4 F (36.9 C) (01/12 0545) Pulse Rate:  [59-64] 64 (01/12 0545) Resp:  [16-18] 18 (01/12 0545) BP: (100-135)/(42-52) 135/42 mmHg (01/12 0545) SpO2:  [97 %-98 %] 97 % (01/12 0545) Weight:  [224 lb 3.3 oz (101.7 kg)] 224 lb 3.3 oz (101.7 kg) (01/12  0545) Last BM Date: 05/04/14  Weight change: Filed Weights   05/03/14 0500 05/04/14 0500 05/05/14 0545  Weight: 212 lb 8.4 oz (96.4 kg) 214 lb 8.1 oz (97.3 kg) 224 lb 3.3 oz (101.7 kg)    Intake/Output:   Intake/Output Summary (Last 24 hours) at 05/05/14 0821 Last data filed at 05/05/14 0631  Gross per 24 hour  Intake    600 ml  Output    750 ml  Net   -150 ml    Physical Exam: General: Chronically ill appearing. No resp difficulty. Sitting in wheelchair.  HEENT: normal Neck: supple. JVP5-6 cm. Carotids 2+ bilat; no bruits. No lymphadenopathy or thryomegaly appreciated. Cor: PMI nondisplaced. Regular rate & rhythm. No rubs, gallops or murmurs. Lungs: clear on 2 liters  Abdomen: soft, nontender, distended. No hepatosplenomegaly. No bruits or masses. Good bowel sounds. Extremities: no cyanosis, clubbing, rash, edema. LUE LLE weakness noted.  Neuro: alert & orientedx3, cranial nerves grossly intact. moves all 4 extremities w/o difficulty. Affect pleasant   Labs: Basic Metabolic Panel:  Recent Labs Lab 05/05/14 0458  NA 137  K 3.4*  CL 101  CO2 30  GLUCOSE 87  BUN 19  CREATININE 1.34  CALCIUM 8.1*    Liver Function Tests: No results for input(s): AST, ALT, ALKPHOS, BILITOT, PROT, ALBUMIN in the last 168 hours. No results for input(s): LIPASE, AMYLASE in the last 168 hours. No results for input(s): AMMONIA in the last 168 hours.  CBC: No results for input(s): WBC, NEUTROABS, HGB, HCT, MCV,  PLT in the last 168 hours.  Cardiac Enzymes: No results for input(s): CKTOTAL, CKMB, CKMBINDEX, TROPONINI in the last 168 hours.  BNP: BNP (last 3 results)  Recent Labs  09/19/13 1941 09/24/13 0400 01/14/14 1050  PROBNP 3530.0* 1792.0* 872.6*     Other results:  EKG:   Imaging: Ct Head Wo Contrast  05/03/2014   CLINICAL DATA:  Follow-up right basal ganglia hemorrhage.  EXAM: CT HEAD WITHOUT CONTRAST  TECHNIQUE: Contiguous axial images were obtained from the  base of the skull through the vertex without intravenous contrast.  COMPARISON:  04/10/2014  FINDINGS: No evidence of parenchymal hemorrhage or extra-axial fluid collection. No mass lesion, mass effect, or midline shift.  No CT evidence of acute infarction.  Hypodensity in the right basal ganglia/thalamus (series 21/image 17), reflecting encephalomalacic changes related to prior hemorrhage.  Additional encephalomalacic changes related to prior left parietal infarct.  Small vessel ischemic changes.  Intracranial atherosclerosis.  Mild global cortical atrophy.  No ventriculomegaly.  The visualized paranasal sinuses are essentially clear. The mastoid air cells are unopacified.  No evidence of calvarial fracture.  IMPRESSION: Hypodensity/encephalomalacic changes in the right basal ganglia/thalamus, related to prior hemorrhage.  Encephalomalacic changes related to prior left parietal infarct.  Atrophy with small vessel ischemic changes and intracranial atherosclerosis.   Electronically Signed   By: Julian Hy M.D.   On: 05/03/2014 10:23     Medications:     Scheduled Medications: . antiseptic oral rinse  7 mL Mouth Rinse BID  . aspirin EC  81 mg Oral Daily  . atorvastatin  80 mg Oral q1800  . budesonide-formoterol  2 puff Inhalation BID  . carvedilol  12.5 mg Oral BID WC  . diclofenac sodium  2 g Topical QID  . digoxin  0.125 mg Oral Q48H  . feeding supplement (ENSURE COMPLETE)  237 mL Oral Q1400  . feeding supplement (PRO-STAT SUGAR FREE 64)  30 mL Oral Daily  . feeding supplement (RESOURCE BREEZE)  1 Container Oral BID BM  . lisinopril  2.5 mg Oral Daily  . mirtazapine  7.5 mg Oral QHS  . MUSCLE RUB   Topical TID WC  . pantoprazole  40 mg Oral Daily  . polyethylene glycol  17 g Oral QODAY  . senna-docusate  2 tablet Oral QHS  . spironolactone  12.5 mg Oral Daily  . tiZANidine  4 mg Oral TID  . torsemide  20 mg Oral Daily    Infusions:    PRN Medications: acetaminophen, alum & mag  hydroxide-simeth, bisacodyl, diphenhydrAMINE, guaiFENesin-dextromethorphan, prochlorperazine **OR** prochlorperazine **OR** prochlorperazine, torsemide, traMADol, traZODone   Assessment/Plan   [name] is 64 year old with known biventricular HF and chronic A fib followed closely in the HF clinc. Admitted CIR after ICH requiring ongoing trehabilitation due deficits noted on the Left. The HF team was asked to consult by Dr Naaman Plummer for HF and worsening renal function.  1. ICH: off anticoagulants. Plan to repeat CT of head 3-4 weeks to reconsider anticoagulants.  2. Chronic Biventricular HF R>L NICM, EF 40-45%, TAPSE < 1.0 (09/2013).  I weighed him personally. Weight 209 pounds on standing scale.  - Continue Coreg and spironolactone.  - Continue  lisinopril 2.5 mg a day.   - Continue torsemide 20 mg daily.  -Check BMET in am.   3. Severe COPD with cor pulmonale: on chronic oxygen.  4 Chronic A fib s/p MAZE: failed DC-CV (09/2013). Today EKG appears to be NSR. Not on anticoagulants as above (ICH).  Per Neuro will need repeat CT before considering anticoagulants. However, given ICH on Eliquis (lower risk for ICH than warfarin), I am concerned that restarting anticoagulation will be high risk.  - Could eventually start coumadin with goal INR 1.8-2.3.  5. AKI: Suspect prerenal azotemia. Creatinine back to baseline. BMET tomorrow morning.  6. Hypokalemia: Resolved.  7. Hyponatremia: Suspect hypovolemic hyponatremia (use of metolazone - thiazide diuretic - also likely contributes). Resolved.      Length of Stay: 57  CLEGG,AMY NP-C  05/05/2014, 8:21 AM   Patient seen with NP, agree with the above note.  Creatinine ok.  Continue current meds, no changes.   Loralie Champagne 05/05/2014 8:36 AM

## 2014-05-05 NOTE — Progress Notes (Signed)
Occupational Therapy Session Note  Patient Details  Name: [name] MRN: [id number] Date of Birth: [birth year]  Today's Date: 05/05/2014 OT Individual Time: 0071-2197 OT Individual Time Calculation (min): 60 min    Short Term Goals: Week 3:  OT Short Term Goal 1 (Week 3): Pt will complete bathing at sit <> stand level with max assist of 1 person OT Short Term Goal 2 (Week 3): Pt will complete LB dressing at sit <> stand level with max assist of 1 person OT Short Term Goal 3 (Week 3): Pt will complete UB dressing with supervision OT Short Term Goal 4 (Week 3): Pt will use LUE as gross assist with self-care tasks with min verbal cues  Skilled Therapeutic Interventions/Progress Updates:    Engaged in ADL retraining with focus on increased participation in self-care tasks, sit <> stand, standing balance, and functional use of LUE during self-care tasks.  Bathing completed at sit > stand level at sink with min assist for sit <> stand and to maintain standing balance while completing perineal hygiene and washing buttocks.  Pt with improved motor control during sit > stand and while standing.  Required hand over hand to maintain WB through LUE during sit > stand and while standing.  Pt completed squat pivot transfers to/from drop arm BSC with min assist and min verbal cues for setup.  Issued pt shoe buttons to assist with fastening shoes with pt return demonstrating task.  Therapy Documentation Precautions:  Precautions Precautions: Fall Precaution Comments: LUE inattention Required Braces or Orthoses: Other Brace/Splint Other Brace/Splint: For standing, functional ambulation, recommend hyperextension brace on R knee and hinged brace on L knee. Restrictions Weight Bearing Restrictions: No General:   Vital Signs: Oxygen Therapy O2 Device: Nasal Cannula O2 Flow Rate (L/min): 2 L/min Pain: Pain Assessment Pain Assessment: No/denies pain  See FIM for current functional  status  Therapy/Group: Individual Therapy  Simonne Come 05/05/2014, 10:45 AM

## 2014-05-05 NOTE — Plan of Care (Signed)
Problem: RH Ambulation Goal: LTG Patient will ambulate in controlled environment (PT) LTG: Patient will ambulate in a controlled environment, # of feet with assistance (PT).  Outcome: Not Met (add Reason) Pt able to ambulate x10' with assist of 2 therapists.

## 2014-05-06 NOTE — Progress Notes (Signed)
Social Work Discharge Note  The overall goal for the admission was met for:   Discharge location: Yes - [place] (SNF)  Length of Stay: Yes - 22 days  Discharge activity level: Yes - min assist  Home/community participation: No  Services provided included: MD, RD, PT, OT, SLP, RN, Pharmacy, Neuropsych and SW  Financial Services: Private Insurance: [place] and then [facility] effective 04-24-14  Follow-up services arranged: Other: SNF  Comments (or additional information):  Pt transferred to [facility] for further rehabilitation.  Pt's dtr assisted with the process from via telephone, as she lives 2 hours away and is [redacted] weeks pregnant with some restrictions in her pregnancy.  Pt's brother was present at the time of pt's transfer to support him and to take some of his belongings.  Patient/Family verbalized understanding of follow-up arrangements: Yes  Individual responsible for coordination of the follow-up plan: pt with his dtr, [name]  Confirmed correct DME delivered: [name] 05/06/2014    [name], [name]

## 2014-05-07 ENCOUNTER — Non-Acute Institutional Stay (SKILLED_NURSING_FACILITY): Payer: 59 | Admitting: Adult Health

## 2014-05-07 ENCOUNTER — Encounter: Payer: Self-pay | Admitting: Adult Health

## 2014-05-07 DIAGNOSIS — G8190 Hemiplegia, unspecified affecting unspecified side: Secondary | ICD-10-CM

## 2014-05-07 DIAGNOSIS — I480 Paroxysmal atrial fibrillation: Secondary | ICD-10-CM

## 2014-05-07 DIAGNOSIS — G8194 Hemiplegia, unspecified affecting left nondominant side: Secondary | ICD-10-CM

## 2014-05-07 DIAGNOSIS — K5900 Constipation, unspecified: Secondary | ICD-10-CM

## 2014-05-07 DIAGNOSIS — F32A Depression, unspecified: Secondary | ICD-10-CM

## 2014-05-07 DIAGNOSIS — I5022 Chronic systolic (congestive) heart failure: Secondary | ICD-10-CM

## 2014-05-07 DIAGNOSIS — I610 Nontraumatic intracerebral hemorrhage in hemisphere, subcortical: Secondary | ICD-10-CM

## 2014-05-07 DIAGNOSIS — F329 Major depressive disorder, single episode, unspecified: Secondary | ICD-10-CM

## 2014-05-07 DIAGNOSIS — M1711 Unilateral primary osteoarthritis, right knee: Secondary | ICD-10-CM

## 2014-05-07 DIAGNOSIS — J439 Emphysema, unspecified: Secondary | ICD-10-CM

## 2014-05-07 DIAGNOSIS — E876 Hypokalemia: Secondary | ICD-10-CM

## 2014-05-07 DIAGNOSIS — E785 Hyperlipidemia, unspecified: Secondary | ICD-10-CM

## 2014-05-07 NOTE — Progress Notes (Signed)
Patient ID: [name], male   DOB: February 22, 1951, 64 y.o.   MRN: [id number]   05/07/2014  Facility:  [facility] Home Location:  [place] Room Number: 605-P LEVEL OF CARE:  SNF (31)   Chief Complaint  Patient presents with  . Hospitalization Follow-up    Acute left hemiparesis, atrial fibrillation, COPD, systolic CHF, depression, hypokalemia, constipation, hyperlipidemia and osteoarthritis    HISTORY OF PRESENT ILLNESS:  This is a 64 year old male was been admitted to [facility] on 05/05/14 from [facility]. He has past medical history of hypertension, COPD and atrial fibrillation on Eliquis. He was brought to the hospital with chief complaints of left side weakness and mild slurred speech. CCT showed a right IC basal ganglia hemorrhage with a 4 mm right to left midline shift. Eliquis was reversed. Repeat carotid Dopplers done revealing 60-79% left ICA and 40-59% right ICA stenosis. Swallow evaluation done revealing dysphagia and patient was placed on a mechanical soft and nectar thick liquid diet. He was then transferred to rehabilitation on 04/13/14 for inpatient therapies. Stay was complicated by acute renal failure due to dehydration. Gentle hydration and potassium supplementation were given. Renal failure has resolved. Liquids advanced to thin.  He had adjustment reaction with mild depression and was started on Remeron while at rehabilitation. Follow-up CT of head done on 1/10 showed resolution of bleed. Neurology, Dr. Leonie Man, was consulted and restarted aspirin 81 mg on 05/04/14.  Patient is seen in his room. He is alert and oriented. He reports being back to his usual self except that he feels left sided weakness. He has been admitted for a short-term rehabilitation.   PAST MEDICAL HISTORY:  Past Medical History  Diagnosis Date  . Hypertension   . Gout   . Aortic stenosis     echo 06/19/08 with nomal LV function, moderate concentric LVH  moderate aortic stenosis area 0.99 cm squared, peak gradient of 50 and mean of 31  . Hyperlipidemia   . Atrial fibrillation with RVR, was in atrial fib in 04/2011 09/16/2012    a) s/p MAZE (09/2012)   . Tobacco use 09/16/2012  . Heart murmur   . Chronic combined systolic and diastolic CHF (congestive heart failure) 09/16/2012    a) ECHO (08/2013) EF 50-55%, grade II DD b) TEE ECHO (09/2013): 40-45%, bioprosthesis present, mild MR  . Aortic stenosis, severe 09/23/2012  . CAD (coronary artery disease), single vessel disease 09/23/2012    a) CABG (SVG to PDA and PL) wtih AVR (09/2012)   . PAD (peripheral artery disease), decreased bil. ABIs 09/23/2012    a) ABIs (11/2013): Right mild arterial insufficiency, L normal; aroto-bifem bypass graft: not well visualized, bilateral SFAs = to greater than 50% in dimaeter reduction   . Gout flare. 09/29/12, Lt knee, improved with colchicine. 09/30/2012  . H/O aortic valve replacement   . AS (aortic stenosis) with AVR with pericardial tissue valve  09/30/2013  . Back pain, spinal stenosis 09/30/2013  . COPD (chronic obstructive pulmonary disease)   . Bilateral carotid artery disease     CURRENT MEDICATIONS: Reviewed per MAR/see medication list  No Known Allergies   REVIEW OF SYSTEMS:  GENERAL: no change in appetite, no fatigue, no weight changes, no fever, chills  RESPIRATORY: no cough, SOB, DOE, wheezing, hemoptysis CARDIAC: no chest pain, or palpitations GI: no abdominal pain, diarrhea, constipation, heart burn, nausea or vomiting  PHYSICAL EXAMINATION  GENERAL: no acute distress, obese EYES: conjunctivae normal, sclerae  normal, normal eye lids NECK: supple, trachea midline, no neck masses, no thyroid tenderness, no thyromegaly LYMPHATICS: no LAN in the neck, no supraclavicular LAN RESPIRATORY: breathing is even & unlabored, BS CTAB CARDIAC: RRR, no murmur,no extra heart sounds,BLE edema 2+ GI: abdomen soft, normal BS, no masses, no tenderness, no hepatomegaly,  no splenomegaly EXTREMITIES: left hemiparesis; able to move X 4 extremities but has weakness on LUE and LLE PSYCHIATRIC: the patient is alert & oriented to person, affect & behavior appropriate  LABS/RADIOLOGY: Labs reviewed: Basic Metabolic Panel:  Recent Labs  04/26/14 0705 04/27/14 0820 05/05/14 0458  NA 132* 135 137  K 4.6 5.0 3.4*  CL 99 102 101  CO2 29 27 30   GLUCOSE 131* 149* 87  BUN 34* 32* 19  CREATININE 1.31 1.40* 1.34  CALCIUM 8.2* 8.6 8.1*   Liver Function Tests:  Recent Labs  12/31/13 1103 04/07/14 1607 04/09/14 0210 04/22/14 0400  AST 29 33 84*  --   ALT 28 21 26   --   ALKPHOS 51 78 66  --   BILITOT 0.5 0.4 0.4  --   PROT 7.9 7.8 6.7  --   ALBUMIN 3.9 3.3* 2.7* 3.2*    Recent Labs  09/25/13 2045  AMMONIA 25   CBC:  Recent Labs  09/30/13 0408 04/07/14 1607 04/07/14 1619 04/09/14 0210  WBC 6.8 6.9  --  6.1  NEUTROABS  --  3.6  --  3.3  HGB 11.0* 10.9* 12.6* 10.3*  HCT 37.5* 35.7* 37.0* 34.2*  MCV 91.7 86.9  --  88.4  PLT 241 215  --  177   Lipid Panel:  Recent Labs  12/31/13 1102  HDL 73   CBG:  Recent Labs  04/13/14 0637 04/13/14 0739 04/13/14 1200  GLUCAP 125* 118* 145*    Dg Chest 2 View  05/05/2014   CLINICAL DATA:  SOB TODAY,COPD  EXAM: CHEST  2 VIEW  COMPARISON:  None.  FINDINGS: There is bilateral diffuse interstitial thickening likely representing mild interstitial edema. There is no focal parenchymal opacity, pleural effusion, or pneumothorax. The heart and mediastinal contours are unremarkable. There is evidence of prior CABG.  The osseous structures are unremarkable.  IMPRESSION: Bilateral diffuse interstitial thickening likely representing mild interstitial edema.   Electronically Signed   By: Kathreen Devoid   On: 05/05/2014 11:30   Dg Knee 1-2 Views Right  04/13/2014   CLINICAL DATA:  Chronic anterior right knee pain for 2 years. Initial encounter.  EXAM: RIGHT KNEE - 1-2 VIEW  COMPARISON:  None.  FINDINGS: There  is no evidence of fracture or dislocation. The joint spaces are preserved. There is mild cortical irregularity at the lateral compartment, and marginal osteophytes are seen arising at the medial and lateral compartments. There is suggestion of mild chondrocalcinosis.  A small to moderate knee joint effusion is seen. Scattered clips are noted about the posterior aspect of the knee. Diffuse vascular calcifications are seen.  IMPRESSION: 1. No evidence of fracture or dislocation. 2. Small to moderate knee joint effusion noted. 3. Mild osteoarthritis seen, with suggestion of mild chondrocalcinosis. 4. Diffuse vascular calcifications noted.   Electronically Signed   By: Garald Balding M.D.   On: 04/13/2014 23:27   Ct Head Wo Contrast  05/03/2014   CLINICAL DATA:  Follow-up right basal ganglia hemorrhage.  EXAM: CT HEAD WITHOUT CONTRAST  TECHNIQUE: Contiguous axial images were obtained from the base of the skull through the vertex without intravenous contrast.  COMPARISON:  04/10/2014  FINDINGS: No evidence of parenchymal hemorrhage or extra-axial fluid collection. No mass lesion, mass effect, or midline shift.  No CT evidence of acute infarction.  Hypodensity in the right basal ganglia/thalamus (series 21/image 17), reflecting encephalomalacic changes related to prior hemorrhage.  Additional encephalomalacic changes related to prior left parietal infarct.  Small vessel ischemic changes.  Intracranial atherosclerosis.  Mild global cortical atrophy.  No ventriculomegaly.  The visualized paranasal sinuses are essentially clear. The mastoid air cells are unopacified.  No evidence of calvarial fracture.  IMPRESSION: Hypodensity/encephalomalacic changes in the right basal ganglia/thalamus, related to prior hemorrhage.  Encephalomalacic changes related to prior left parietal infarct.  Atrophy with small vessel ischemic changes and intracranial atherosclerosis.   Electronically Signed   By: Julian Hy M.D.   On:  05/03/2014 10:23   Ct Head Wo Contrast  04/10/2014   CLINICAL DATA:  64 year old male with acute intracranial hemorrhage diagnosed by CT for code stroke symptoms on 04/07/2014. Subsequent encounter.  EXAM: CT HEAD WITHOUT CONTRAST  TECHNIQUE: Contiguous axial images were obtained from the base of the skull through the vertex without intravenous contrast.  COMPARISON:  04/08/2014 and earlier.  FINDINGS: Stable visualized osseous structures. Stable paranasal sinuses and mastoids. Stable orbit and scalp soft tissues. Calcified atherosclerosis at the skull base.  Hyperdense blood products re- identified, epicenter at the right thalamus. Secondary extension into the right lateral ventricle again noted. Stable small volume of intraventricular hemorrhage. No ventriculomegaly. Mild leftward midline shift of 4 mm is stable. Size and configuration of intra-axial blood products stable, encompassing 19 x 23 mm (AP by transverse), with estimated volume of intra-axial blood products of 7 mL.  Stable gray-white matter differentiation elsewhere. No new intracranial hemorrhage.  IMPRESSION: 1. Stable intra-axial hemorrhage centered at the right thalamus with small volume intraventricular extension of blood. 2. Stable mild regional mass effect.  No ventriculomegaly. 3. No new intracranial abnormality.   Electronically Signed   By: Lars Pinks M.D.   On: 04/10/2014 21:24   Ct Head Wo Contrast  04/08/2014   CLINICAL DATA:  Followup intracranial hemorrhage.  EXAM: CT HEAD WITHOUT CONTRAST  TECHNIQUE: Contiguous axial images were obtained from the base of the skull through the vertex without intravenous contrast.  COMPARISON:  04/07/2014  FINDINGS: Intraparenchymal hemorrhage in the right thalamus again measures 15 x 24 mm. There is mild surrounding edema. Small amount of intraventricular penetration remains evident with blood in the right lateral ventricle. Right-to-left shift is unchanged measuring 4 mm. Old infarction at the  left temporoparietal junction region appears the same. No extra-axial collection. No subarachnoid hemorrhage. No evidence of neoplastic mass lesion. The calvarium is unremarkable. Sinuses are clear. Atherosclerotic calcification affects the major vessels at the base of the brain.  IMPRESSION: Stable exams inch yesterday. Right thalamic intraparenchymal hematoma again measured at 15 x 24 mm. Mild surrounding edema. Small amount of intraventricular hemorrhage, not increased and without hydrocephalus.   Electronically Signed   By: Nelson Chimes M.D.   On: 04/08/2014 15:24    ASSESSMENT/PLAN:  Acute hemiparesis - for rehabilitation  Nontraumatic intracranial hemorrhage - follow-up CT of head showed resolution of bleed; follow-up with neurology COPD - continue O2 at 2 L/minute via nasal cannula; continue budesonide-formeterol Puff Paroxysmal atrial fibrillation - rate controlled; continue aspirin 81 mg daily, carvedilol and digoxin Chronic systolic CHF - stable; continue carvedilol, lisinopril, Aldactone and Demadex Depression - mood is stable; continue Remeron Hypokalemia - K3.4; continue K Dur supplementation Constipation - continue MiraLAX and Senokot  S Hyperlipidemia - continue Lipitor Osteoarthritis - continue Voltaren 1% gel to right knee   Goals of care:  Short-term rehabilitation    Labs/test ordered:  none    Spent 50 minutes in patient care.      [facility], NP [employer] [phone]

## 2014-05-08 ENCOUNTER — Non-Acute Institutional Stay (SKILLED_NURSING_FACILITY): Payer: 59 | Admitting: Internal Medicine

## 2014-05-08 DIAGNOSIS — R238 Other skin changes: Secondary | ICD-10-CM

## 2014-05-08 DIAGNOSIS — F32A Depression, unspecified: Secondary | ICD-10-CM

## 2014-05-08 DIAGNOSIS — E785 Hyperlipidemia, unspecified: Secondary | ICD-10-CM

## 2014-05-08 DIAGNOSIS — G8190 Hemiplegia, unspecified affecting unspecified side: Secondary | ICD-10-CM

## 2014-05-08 DIAGNOSIS — R233 Spontaneous ecchymoses: Secondary | ICD-10-CM

## 2014-05-08 DIAGNOSIS — F329 Major depressive disorder, single episode, unspecified: Secondary | ICD-10-CM

## 2014-05-08 DIAGNOSIS — I482 Chronic atrial fibrillation, unspecified: Secondary | ICD-10-CM

## 2014-05-08 DIAGNOSIS — J449 Chronic obstructive pulmonary disease, unspecified: Secondary | ICD-10-CM

## 2014-05-08 DIAGNOSIS — G8194 Hemiplegia, unspecified affecting left nondominant side: Secondary | ICD-10-CM

## 2014-05-08 DIAGNOSIS — K5900 Constipation, unspecified: Secondary | ICD-10-CM

## 2014-05-08 DIAGNOSIS — I639 Cerebral infarction, unspecified: Secondary | ICD-10-CM

## 2014-05-08 DIAGNOSIS — I5022 Chronic systolic (congestive) heart failure: Secondary | ICD-10-CM

## 2014-05-08 DIAGNOSIS — K219 Gastro-esophageal reflux disease without esophagitis: Secondary | ICD-10-CM

## 2014-05-08 NOTE — Progress Notes (Signed)
Patient ID: [name], male   DOB: [birth year], 64 y.o.   MRN: [id number]     [facility]   PCP: [facility], [place]  Code Status: full code  No Known Allergies  Chief Complaint  Patient presents with  . New Admit To SNF     HPI:  64 year old patient is here for short term rehabilitation post hospital admission from 04/07/14-04/13/14 with left sided weakness and slurred speech and diagnosed to have acute hemorrhagic stroke. CT head showed right IC basal ganglia hemorrhage with a 4 mm right to left midline shift. SLP test showed dysphagia and patient was placed on a mechanical soft and nectar thick liquid diet. He was then transferred to rehabilitation on 04/13/14 for inpatient rehabilitation. He had ARF, depression, lytes abnormalities which were treated. He was in inpatient rehabilitation from 04/13/14-05/05/14. He was put on baby aspirin. He has past medical history of hypertension, COPD, atrial fibrillation on Eliquis. He is seen in his room today. He has been working with therapy team, he mentions keeping positive thought. He denies any concerns.  Review of Systems:  Constitutional: Negative for fever, chills, malaise/fatigue and diaphoresis.  HENT: Negative for headache, congestion Eyes: Negative for eye pain, blurred vision, double vision and discharge.  Respiratory: Negative for cough, shortness of breath and wheezing.   Cardiovascular: Negative for chest pain, palpitations, leg swelling.  Gastrointestinal: Negative for heartburn, nausea, vomiting, abdominal pain, melena, rectal bleed, diarrhea and constipation. appetite is good. He would like his diet upgraded and mentions he was getting regular consistency diet in the hospital by time of discharge. Genitourinary: Negative for dysuria and flank pain.  Musculoskeletal: Negative for back pain, falls Skin: Negative for itching, rash. gets bruised easily and has skin tears. Neurological:  Negative for dizziness, tingling. Has generalized and focal weakness Psychiatric/Behavioral: Negative for depression.    Past Medical History  Diagnosis Date  . Hypertension   . Gout   . Aortic stenosis     echo 06/19/08 with nomal LV function, moderate concentric LVH moderate aortic stenosis area 0.99 cm squared, peak gradient of 50 and mean of 31  . Hyperlipidemia   . Atrial fibrillation with RVR, was in atrial fib in 04/2011 09/16/2012    a) s/p MAZE (09/2012)   . Tobacco use 09/16/2012  . Heart murmur   . Chronic combined systolic and diastolic CHF (congestive heart failure) 09/16/2012    a) ECHO (08/2013) EF 50-55%, grade II DD b) TEE ECHO (09/2013): 40-45%, bioprosthesis present, mild MR  . Aortic stenosis, severe 09/23/2012  . CAD (coronary artery disease), single vessel disease 09/23/2012    a) CABG (SVG to PDA and PL) wtih AVR (09/2012)   . PAD (peripheral artery disease), decreased bil. ABIs 09/23/2012    a) ABIs (11/2013): Right mild arterial insufficiency, L normal; aroto-bifem bypass graft: not well visualized, bilateral SFAs = to greater than 50% in dimaeter reduction   . Gout flare. 09/29/12, Lt knee, improved with colchicine. 09/30/2012  . H/O aortic valve replacement   . AS (aortic stenosis) with AVR with pericardial tissue valve  09/30/2013  . Back pain, spinal stenosis 09/30/2013  . COPD (chronic obstructive pulmonary disease)   . Bilateral carotid artery disease    Past Surgical History  Procedure Laterality Date  . Iliac artery stent  10/20/08    stent to lt iliac  . Aorto bifem bypass  12/18/08    by Dr. Trula Slade  . Coronary artery bypass graft  N/A 10/08/2012    Procedure: CORONARY ARTERY BYPASS GRAFTING (CABG);  Surgeon: Grace Isaac, MD;  Location: [place];  Service: Open Heart Surgery;  Laterality: N/A;  Coronary Artery bypass Graft times two utilizing the left greater saphenous vein harvested endoscopically  . Aortic valve replacement N/A 10/08/2012    Procedure: AORTIC VALVE  REPLACEMENT (AVR);  Surgeon: Grace Isaac, MD;  Location: [place];  Service: Open Heart Surgery;  Laterality: N/A;  . Maze N/A 10/08/2012    Procedure: MAZE;  Surgeon: Grace Isaac, MD;  Location: [place];  Service: Open Heart Surgery;  Laterality: N/A;  . Intraoperative transesophageal echocardiogram N/A 10/08/2012    Procedure: INTRAOPERATIVE TRANSESOPHAGEAL ECHOCARDIOGRAM;  Surgeon: Grace Isaac, MD;  Location: [place];  Service: Open Heart Surgery;  Laterality: N/A;  . Endovein harvest of greater saphenous vein Bilateral 10/08/2012    Procedure: ENDOVEIN HARVEST OF GREATER SAPHENOUS VEIN;  Surgeon: Grace Isaac, MD;  Location: [place];  Service: Open Heart Surgery;  Laterality: Bilateral;  . US echocardiography  06/20/2011    mod concentric LVH,LA severely dilated,RA mildly dilated,mod. ca+ of the mitral apparatus,trace MR,mod. ca+ AOV w/stenosis.  Marland Kitchen Nm myocar perf wall motion  08/25/2008    normal  . Cardioversion N/A 02/25/2013    Procedure: CARDIOVERSION;  Surgeon: Sanda Klein, MD;  Location: [place];  Service: Cardiovascular;  Laterality: N/A;  . Tee without cardioversion N/A 09/25/2013    Procedure: TRANSESOPHAGEAL ECHOCARDIOGRAM (TEE);  Surgeon: Thayer Headings, MD;  Location: [place];  Service: Cardiovascular;  Laterality: N/A;  . Cardioversion N/A 09/25/2013    Procedure: CARDIOVERSION;  Surgeon: Thayer Headings, MD;  Location: [facility] ENDOSCOPY;  Service: Cardiovascular;  Laterality: N/A;  . Left and right heart catheterization with coronary angiogram N/A 09/20/2012    Procedure: LEFT AND RIGHT HEART CATHETERIZATION WITH CORONARY ANGIOGRAM;  Surgeon: Lorretta Harp, MD;  Location: [facility] CATH LAB;  Service: Cardiovascular;  Laterality: N/A;   Social History:   reports that he quit smoking about 19 months ago. His smoking use included Cigarettes. He has a 43 pack-year smoking history. He has never used smokeless tobacco. He reports that he drinks about 25.2 - 50.4 oz of alcohol  per week. He reports that he does not use illicit drugs.  Family History  Problem Relation Age of Onset  . Heart attack Father     died at 23 with MI  . Heart disease Brother   . Heart attack Brother   . Heart attack Brother     Medications: Patient's Medications  New Prescriptions   No medications on file  Previous Medications   ALBUTEROL (PROVENTIL HFA;VENTOLIN HFA) 108 (90 BASE) MCG/ACT INHALER    Inhale 2 puffs into the lungs every 6 (six) hours as needed for wheezing or shortness of breath.   ASPIRIN EC 81 MG EC TABLET    Take 1 tablet (81 mg total) by mouth daily.   ATORVASTATIN (LIPITOR) 80 MG TABLET    Take 1 tablet (80 mg total) by mouth daily at 6 PM.   BUDESONIDE-FORMOTEROL (SYMBICORT) 80-4.5 MCG/ACT INHALER    Inhale 2 puffs into the lungs 2 (two) times daily.   CARVEDILOL (COREG) 12.5 MG TABLET    Take 1 tablet (12.5 mg total) by mouth 2 (two) times daily with a meal.   DICLOFENAC SODIUM (VOLTAREN) 1 % GEL    Apply 2 g topically 4 (four) times daily. To right knee   DIGOXIN (LANOXIN) 0.125 MG TABLET  Take 1 tablet (0.125 mg total) by mouth every other day.   FOLIC ACID (FOLVITE) 1 MG TABLET    Take 1 tablet (1 mg total) by mouth daily.   LISINOPRIL (PRINIVIL,ZESTRIL) 2.5 MG TABLET    Take 1 tablet (2.5 mg total) by mouth daily.   MIRTAZAPINE (REMERON) 7.5 MG TABLET    Take 1 tablet (7.5 mg total) by mouth at bedtime.   OXYGEN-HELIUM IN    Inhale 1-2 L into the lungs continuous. Is in the habit of not using his oxygen when he leaves the house. Uses 1L continuous normally Uses 2L for exertion   PANTOPRAZOLE (PROTONIX) 40 MG TABLET    Take 1 tablet (40 mg total) by mouth daily.   POLYETHYLENE GLYCOL (MIRALAX / GLYCOLAX) PACKET    Take 17 g by mouth every other day.   POTASSIUM CHLORIDE SA (K-DUR,KLOR-CON) 20 MEQ TABLET    Take 1 tablet (20 mEq total) by mouth daily.   PRESCRIPTION MEDICATION    Apply 1 application topically daily.   SENNA-DOCUSATE (SENOKOT-S) 8.6-50 MG  PER TABLET    Take 2 tablets by mouth at bedtime.   SPIRONOLACTONE (ALDACTONE) 25 MG TABLET    Take 0.5 tablets (12.5 mg total) by mouth daily.   TIZANIDINE (ZANAFLEX) 4 MG TABLET    Take 1 tablet (4 mg total) by mouth 3 (three) times daily.   TORSEMIDE (DEMADEX) 20 MG TABLET    Take one daily in am   TRAMADOL (ULTRAM) 50 MG TABLET    Take 1 tablet (50 mg total) by mouth every 6 (six) hours as needed for moderate pain.  Modified Medications   No medications on file  Discontinued Medications   No medications on file     Physical Exam: Filed Vitals:   05/08/14 1337  BP: 109/64  Pulse: 77  Temp: 98 F (36.7 C)  Resp: 18  SpO2: 96%    General- elderly male, well built, in no acute distress Head- normocephalic, atraumatic Throat- moist mucus membrane Neck- no cervical lymphadenopathy Cardiovascular- normal s1,s2, no murmurs, palpable dorsalis pedis, trace leg edema Respiratory- bilateral clear to auscultation, no wheeze, no rhonchi, no crackles, no use of accessory muscles Abdomen- bowel sounds present, soft, non tender Musculoskeletal- able to move all 4 extremities, left UE strength 4/5 and LLE strength 3/5. no spinal and paraspinal tenderness Neurological- alert and oriented, left sided weakness Skin- warm and dry, gerisleeves left arm with skin tear, ted hose in both LE Psychiatry- normal mood and affect   Labs reviewed: Basic Metabolic Panel:  Recent Labs  04/26/14 0705 04/27/14 0820 05/05/14 0458  NA 132* 135 137  K 4.6 5.0 3.4*  CL 99 102 101  CO2 29 27 30   GLUCOSE 131* 149* 87  BUN 34* 32* 19  CREATININE 1.31 1.40* 1.34  CALCIUM 8.2* 8.6 8.1*   Liver Function Tests:  Recent Labs  12/31/13 1103 04/07/14 1607 04/09/14 0210 04/22/14 0400  AST 29 33 84*  --   ALT 28 21 26   --   ALKPHOS 51 78 66  --   BILITOT 0.5 0.4 0.4  --   PROT 7.9 7.8 6.7  --   ALBUMIN 3.9 3.3* 2.7* 3.2*   No results for input(s): LIPASE, AMYLASE in the last 8760 hours.  Recent  Labs  09/25/13 2045  AMMONIA 25   CBC:  Recent Labs  09/30/13 0408 04/07/14 1607 04/07/14 1619 04/09/14 0210  WBC 6.8 6.9  --  6.1  NEUTROABS  --  3.6  --  3.3  HGB 11.0* 10.9* 12.6* 10.3*  HCT 37.5* 35.7* 37.0* 34.2*  MCV 91.7 86.9  --  88.4  PLT 241 215  --  177   CBG:  Recent Labs  04/13/14 0637 04/13/14 0739 04/13/14 1200  GLUCAP 125* 118* 145*    Assessment/Plan  Left sided hemiparesis Post hemorrhagic stroke. Will have him work with physical therapy and occupational therapy team to help with gait training and muscle strengthening exercises.fall precautions. Skin care. Encourage to be out of bed. Continue tizanidine 4 mg tid for muscle spasm and tramadol 50 mg q6h prn pain.   Acute CVA Has f/u with neurology, on baby aspirin with hx of afib, monitor for signs of bleed, monitor h&h. Continue statin. On dysphagia diet but pt denies dysphagia, will have SLP assess further. Continue tizanidine 4 mg tid for muscle spasm and tramadol 50 mg q6h prn pain.   Easy bruising Continue geri sleeves and skin care  afib Rate controlled, continue carvedilol, digoxin and aspirin. Off eliquis with Intracranial bleed  COPD Stable breathing. continue O2 at 2 L/minute via nasal cannula, budesonide-formeterol inhaler  Chronic systolic CHF  Monitor weight. continue carvedilol, lisinopril, Aldactone and Demadex. Monitor bmp. Continue kcl supplement  Depression continue Remeron current regimen  gerd Stable, continue protonix 40 mg daily  Constipation continue MiraLAX and Senokot S  Hyperlipidemia continue Lipitor  Goals of care: short term rehabilitation   Labs/tests ordered: cbc, bmp in 1 week  Family/ staff Communication: reviewed care plan with patient and nursing supervisor    Blanchie Serve, MD  [place] [phone] (Monday-Friday 8 am - 5 pm) [phone] (afterhours)

## 2014-05-12 ENCOUNTER — Encounter (HOSPITAL_COMMUNITY): Payer: Self-pay

## 2014-05-19 ENCOUNTER — Ambulatory Visit (HOSPITAL_COMMUNITY)
Admission: RE | Admit: 2014-05-19 | Discharge: 2014-05-19 | Disposition: A | Discharge status: Home / Self Care | Payer: 59 | Source: Ambulatory Visit | Attending: Cardiology | Admitting: Cardiology

## 2014-05-19 ENCOUNTER — Encounter (HOSPITAL_COMMUNITY): Payer: Self-pay

## 2014-05-19 VITALS — BP 108/58 | HR 110

## 2014-05-19 DIAGNOSIS — I482 Chronic atrial fibrillation: Secondary | ICD-10-CM | POA: Insufficient documentation

## 2014-05-19 DIAGNOSIS — I480 Paroxysmal atrial fibrillation: Secondary | ICD-10-CM

## 2014-05-19 DIAGNOSIS — I619 Nontraumatic intracerebral hemorrhage, unspecified: Secondary | ICD-10-CM | POA: Diagnosis not present

## 2014-05-19 DIAGNOSIS — I5022 Chronic systolic (congestive) heart failure: Secondary | ICD-10-CM

## 2014-05-19 DIAGNOSIS — Z951 Presence of aortocoronary bypass graft: Secondary | ICD-10-CM | POA: Insufficient documentation

## 2014-05-19 DIAGNOSIS — N189 Chronic kidney disease, unspecified: Secondary | ICD-10-CM | POA: Insufficient documentation

## 2014-05-19 DIAGNOSIS — I2781 Cor pulmonale (chronic): Secondary | ICD-10-CM | POA: Diagnosis not present

## 2014-05-19 DIAGNOSIS — J449 Chronic obstructive pulmonary disease, unspecified: Secondary | ICD-10-CM | POA: Insufficient documentation

## 2014-05-19 DIAGNOSIS — I509 Heart failure, unspecified: Secondary | ICD-10-CM | POA: Insufficient documentation

## 2014-05-19 DIAGNOSIS — I50810 Right heart failure, unspecified: Secondary | ICD-10-CM

## 2014-05-19 DIAGNOSIS — I2510 Atherosclerotic heart disease of native coronary artery without angina pectoris: Secondary | ICD-10-CM | POA: Insufficient documentation

## 2014-05-19 DIAGNOSIS — Z87891 Personal history of nicotine dependence: Secondary | ICD-10-CM | POA: Insufficient documentation

## 2014-05-19 MED ORDER — AMIODARONE HCL 200 MG PO TABS
ORAL_TABLET | ORAL | Status: DC
Start: 2014-05-19 — End: 2014-06-10

## 2014-05-19 MED ORDER — TORSEMIDE 20 MG PO TABS: 40.0000 mg | ORAL_TABLET | Freq: Every day | ORAL | Status: DC

## 2014-05-19 MED ORDER — POTASSIUM CHLORIDE CRYS ER 20 MEQ PO TBCR: 40.0000 meq | EXTENDED_RELEASE_TABLET | Freq: Every day | ORAL | Status: DC

## 2014-05-19 NOTE — Progress Notes (Signed)
Patient ID: [name], male   DOB: May 31, 1950, 64 y.o.   MRN: [id number] EP: Dr Lovena Le   HPI: [name] is 64 y.o. male with past medical history of severe aortic stenosis s/p tissue AV replacement (09/2012) atrial fibrillation s/p MAZE (09/2012)  chronic combined systolic/diastolic HF, RV failure, HTN, PAD s/p L iliac stent in 2010 and aortobifemoral bypass graft, ETOH abuse, severe COPD with cor pulmonale on home oxygen, CAD s/p CABG with AV replacement and back pain from spinal stenosis.   Admitted and treated for acute failure complicated by listeria bacteremia and Afib RVR 09/2013. Diuresed with IV lasix transitioned to po lasix.He had TEE DC-CV but went back into afib with controlled rate on amiodarone and eliquis. He was treated with 2 weeks of antibiotics. Discharge weight was 226 pounds.  Amiodarone was stopped as patient did not hold NSR.    Patient was admitted to [facility] in 12/15 with right basal ganglia intracerebral hemorrhage and left hemiparesis.  He had been on Eliquis.  He eventually was sent to inpatient rehab, where due to a combination of ongoing diuretic use and poor po intake, he developed AKI and hyponatremia.  Diuretics and ACEI were held, and creatinine returned to baseline.    Of note, patient was in NSR when admitted in 12/15 and remains in NSR today.  He is now at [facility] for ongoing rehab.  He still cannot walk much.  He denies exertional dyspnea or chest pain. No lightheadedness or syncope.  He does have lower leg edema.   ECHO 30-35% 08/2012  ECHO 08/2013 EF 50-55% Grade II DD  TEE 6/15 EF 40-45%, bioprosthetic aortic valve ok  PFTs 9/14 FEV1 1.1L (34%) FVC 2.57L (57%) FEF 25-75% (17%) DLCO 41%  ECG: NSR, septal Qs, PAC  Labs (9/15): LDL 106 Labs (1/16): K 3.4, creatinine 1.34, digoxin 0.4  SH: Former Biochemist, clinical. Quit smoking 1 year ago. Drink alcohol occasionally . Denies drug use. Lives alone   FH: Father died MI at 78 Brother MI   ROS: All systems  negative except as listed in HPI, PMH and Problem List.  Past Medical History  Diagnosis Date  . Hypertension   . Gout   . Aortic stenosis     echo 06/19/08 with nomal LV function, moderate concentric LVH moderate aortic stenosis area 0.99 cm squared, peak gradient of 50 and mean of 31  . Hyperlipidemia   . Atrial fibrillation with RVR, was in atrial fib in 04/2011 09/16/2012    a) s/p MAZE (09/2012)   . Tobacco use 09/16/2012  . Heart murmur   . Chronic combined systolic and diastolic CHF (congestive heart failure) 09/16/2012    a) ECHO (08/2013) EF 50-55%, grade II DD b) TEE ECHO (09/2013): 40-45%, bioprosthesis present, mild MR  . Aortic stenosis, severe 09/23/2012  . CAD (coronary artery disease), single vessel disease 09/23/2012    a) CABG (SVG to PDA and PL) wtih AVR (09/2012)   . PAD (peripheral artery disease), decreased bil. ABIs 09/23/2012    a) ABIs (11/2013): Right mild arterial insufficiency, L normal; aroto-bifem bypass graft: not well visualized, bilateral SFAs = to greater than 50% in dimaeter reduction   . Gout flare. 09/29/12, Lt knee, improved with colchicine. 09/30/2012  . H/O aortic valve replacement   . AS (aortic stenosis) with AVR with pericardial tissue valve  09/30/2013  . Back pain, spinal stenosis 09/30/2013  . COPD (chronic obstructive pulmonary disease)   . Bilateral carotid artery disease  Current Outpatient Prescriptions  Medication Sig Dispense Refill  . aspirin EC 81 MG EC tablet Take 1 tablet (81 mg total) by mouth daily.    Marland Kitchen atorvastatin (LIPITOR) 80 MG tablet Take 1 tablet (80 mg total) by mouth daily at 6 PM. 30 tablet 6  . budesonide-formoterol (SYMBICORT) 80-4.5 MCG/ACT inhaler Inhale 2 puffs into the lungs 2 (two) times daily. 1 Inhaler 12  . carvedilol (COREG) 12.5 MG tablet Take 1 tablet (12.5 mg total) by mouth 2 (two) times daily with a meal. 30 tablet 3  . diclofenac sodium (VOLTAREN) 1 % GEL Apply 2 g topically 4 (four) times daily. To right knee    .  digoxin (LANOXIN) 0.125 MG tablet Take 1 tablet (0.125 mg total) by mouth every other day.    . folic acid (FOLVITE) 1 MG tablet Take 1 tablet (1 mg total) by mouth daily. 30 tablet 6  . lisinopril (PRINIVIL,ZESTRIL) 2.5 MG tablet Take 1 tablet (2.5 mg total) by mouth daily.    . mirtazapine (REMERON) 7.5 MG tablet Take 1 tablet (7.5 mg total) by mouth at bedtime.    . OXYGEN-HELIUM IN Inhale 1-2 L into the lungs continuous. Is in the habit of not using his oxygen when he leaves the house. Uses 1L continuous normally Uses 2L for exertion    . pantoprazole (PROTONIX) 40 MG tablet Take 1 tablet (40 mg total) by mouth daily.    . polyethylene glycol (MIRALAX / GLYCOLAX) packet Take 17 g by mouth every other day. 14 each 0  . potassium chloride SA (K-DUR,KLOR-CON) 20 MEQ tablet Take 2 tablets (40 mEq total) by mouth daily. 60 tablet 6  . senna-docusate (SENOKOT-S) 8.6-50 MG per tablet Take 2 tablets by mouth at bedtime.    Marland Kitchen spironolactone (ALDACTONE) 25 MG tablet Take 0.5 tablets (12.5 mg total) by mouth daily. 30 tablet 3  . tiZANidine (ZANAFLEX) 4 MG tablet Take 1 tablet (4 mg total) by mouth 3 (three) times daily. 15 tablet 0  . torsemide (DEMADEX) 20 MG tablet Take 2 tablets (40 mg total) by mouth daily. Take one daily in am 60 tablet 0  . traMADol (ULTRAM) 50 MG tablet Take 1 tablet (50 mg total) by mouth every 6 (six) hours as needed for moderate pain. 15 tablet 0  . albuterol (PROVENTIL HFA;VENTOLIN HFA) 108 (90 BASE) MCG/ACT inhaler Inhale 2 puffs into the lungs every 6 (six) hours as needed for wheezing or shortness of breath. 1 Inhaler 2  . amiodarone (PACERONE) 200 MG tablet Take 200 mg twice a day for two weeks, then decrease to 200 mg daily 45 tablet 3  . PRESCRIPTION MEDICATION Apply 1 application topically daily.     No current facility-administered medications for this encounter.   Filed Vitals:   05/19/14 1106  BP: 108/58  Pulse: 110  SpO2: 92%    PHYSICAL EXAM: General:   Sitting on exam table. No resp difficulty.  Ambulated in clinic with walker. Brother present HEENT: normal Neck: supple. JVP 8-9 cm. Carotids 2+ bilaterally; no bruits. No lymphadenopathy or thryomegaly appreciated. Cor: PMI normal. RRR, 2/6 SEM RUSB.  Lungs: barrel chested. diminshed throughout fine crackles in bases Abdomen: soft, nontender, nondistended. No hepatosplenomegaly. No bruits or masses. Good bowel sounds. Extremities: no cyanosis, clubbing, rash. 1+ edema to the knees bilaterally.  Neuro: alert & orientedx3, cranial nerves grossly intact. Left-sided weakness.   ASSESSMENT & PLAN:  1. Biventricular HF (R>L): Suspect mixed ischemic/nonischemic cardiomyopathy,  EF 40-45% on 6/15  TEE.  He has had cor pulmonale with prominent RV failure from COPD. NYHA class II-III symptoms but ambulation is very limited by left-sided weakness.  We cut back on his diuretics in the hospital with AKI, now he appears to have some volume overload.  - Increase torsemide to 40 mg daily and KCl to 40 daily.  BMET in 10 days.  - Continue digoxin, recent level was ok. - Continue current doses of lisinopril, spironolactone, and Coreg.  2. Severe COPD with cor pulmonale: Using continuous oxygen. Follows with Dr Lake Bells.  3 Chronic A fib: s/p MAZE. Failed DC-CV x2 in November 2014.  Failed DC-CV (09/2013).  However, he was in NSR in 12/15 at time of hospital admission and is in NSR today.  - As he has shown that he can maintain NSR at times, I will restart amiodarone at 200 mg bid x 2 wks then 200 mg daily.  Check LFTs/TSH with labs in 10 days.  He will need yearly eye exam and baseline PFTs.  - He is off Eliquis with recent intracerebral hemorrhage.  4. CAD: s/p CABG. He is on ASA 81 now that he is off anticoagulation.  Continue atorvastatin 80 mg daily.  Check lipids.  5. Intracerebral hemorrhage: Still has left-sided weakness.  He is off Eliquis.  Ongoing rehab.  6. CKD: AKI in hospital resolved, last creatinine  1.3.  Will repeat BMET 10 days after increase in torsemide.   Loralie Champagne 05/19/2014

## 2014-05-19 NOTE — Patient Instructions (Signed)
INCREASE Torsemide to 40 mg daily INCREASE potassium to 40 meQ daily START Amiodarone 200 mg twice a day for two weeks, then decrease to 200 mg daily  Labs needed in 10 days (BMET,LFT, TSH)  Your physician recommends that you schedule a follow-up appointment in: 3 weeks

## 2014-05-20 NOTE — Addendum Note (Signed)
Encounter addended by: Yehuda Mao on: 05/20/2014  9:19 AM<BR>     Documentation filed: Charges VN

## 2014-06-02 ENCOUNTER — Encounter: Payer: 59 | Attending: Physical Medicine & Rehabilitation

## 2014-06-02 ENCOUNTER — Encounter: Payer: Self-pay | Admitting: Physical Medicine & Rehabilitation

## 2014-06-02 ENCOUNTER — Ambulatory Visit (HOSPITAL_BASED_OUTPATIENT_CLINIC_OR_DEPARTMENT_OTHER): Payer: 59 | Admitting: Physical Medicine & Rehabilitation

## 2014-06-02 VITALS — BP 112/47 | HR 61 | Resp 14

## 2014-06-02 DIAGNOSIS — G8194 Hemiplegia, unspecified affecting left nondominant side: Secondary | ICD-10-CM

## 2014-06-02 DIAGNOSIS — R208 Other disturbances of skin sensation: Secondary | ICD-10-CM

## 2014-06-02 DIAGNOSIS — R269 Unspecified abnormalities of gait and mobility: Secondary | ICD-10-CM | POA: Diagnosis not present

## 2014-06-02 DIAGNOSIS — G8190 Hemiplegia, unspecified affecting unspecified side: Secondary | ICD-10-CM

## 2014-06-02 DIAGNOSIS — I69398 Other sequelae of cerebral infarction: Secondary | ICD-10-CM

## 2014-06-02 DIAGNOSIS — IMO0002 Reserved for concepts with insufficient information to code with codable children: Secondary | ICD-10-CM

## 2014-06-02 DIAGNOSIS — I610 Nontraumatic intracerebral hemorrhage in hemisphere, subcortical: Secondary | ICD-10-CM

## 2014-06-02 NOTE — Progress Notes (Signed)
Subjective:    Patient ID: [name], male    DOB: November 22, 1950, 64 y.o.   MRN: [id number] 64 y.o. RH male with h/o HTN, COPD-oxygen dependent, A fib on Eliquis, s/p MAZE June 2014.  He was admitted on 04/07/2014 past  with left-sided weakness and mild slurred speech. CCT showed a right IC basal ganglia hemorrhage with a 4 mm right to left midline shift . Eliquis was  reversed with Kcentra and BP monitored closely with recommendations to repeat CCT in 3-4 weeks to help determine ability to resume Eliquis.  Repeat carotid dopplers done revealing 60- 79% L-ICA and 40- 59% R-ICA stenosis.Swallow evaluation done revealing dysphagia and patient placed on a mechanical soft nectar thick liquid diet.   HPI Patient was at comprehensive intensive inpatient rehabilitation at [facility] from 04/13/2014 to 05/05/2013. He was discharged to [facility] at a heavy assist level. Patient was only walking with an equal walker 5 steps with physical therapy upon discharge from the hospital. His motor strength on admission to CIR was 2 minus over 5 in the left upper and left lower extremity Patient reports that PT is currently 6 days per week with OT 5 days per week. He is using a walker with standby assistance and a wheelchair trailing him. He is using no Ankle braces but using a left knee orthosis   Pain Inventory Average Pain 5 Pain Right Now 0 My pain is dull  In the last 24 hours, has pain interfered with the following? General activity 0 Relation with others 10 Enjoyment of life 8 What TIME of day is your pain at its worst? activity Sleep (in general) Good  Pain is worse with: some activites Pain improves with: heat/ice and medication Relief from Meds: 10  Mobility ability to climb steps?  no do you drive?  no use a wheelchair transfers alone  Function retired  Neuro/Psych No problems in this area  Prior Studies Any changes since last visit?  no  Physicians involved in your care Any  changes since last visit?  no   Family History  Problem Relation Age of Onset  . Heart attack Father     died at 55 with MI  . Heart disease Brother   . Heart attack Brother   . Heart attack Brother    History   Social History  . Marital Status: Divorced    Spouse Name: N/A    Number of Children: N/A  . Years of Education: N/A   Occupational History  . retired    Social History Main Topics  . Smoking status: Former Smoker -- 1.00 packs/day for 43 years    Types: Cigarettes    Quit date: 09/12/2012  . Smokeless tobacco: Never Used     Comment: More than 43 + pack years as he smoked up to 2 1/2 ppd for many years. Had a period of cessation after femoral stent.  . Alcohol Use: 25.2 - 50.4 oz/week    42-84 Cans of beer per week     Comment: 6-12 cans of beer a day  . Drug Use: No  . Sexual Activity: None   Other Topics Concern  . None   Social History Narrative   Past Surgical History  Procedure Laterality Date  . Iliac artery stent  10/20/08    stent to lt iliac  . Aorto bifem bypass  12/18/08    by Dr. Trula Slade  . Coronary artery bypass graft N/A 10/08/2012    Procedure:  CORONARY ARTERY BYPASS GRAFTING (CABG);  Surgeon: Grace Isaac, MD;  Location: [place];  Service: Open Heart Surgery;  Laterality: N/A;  Coronary Artery bypass Graft times two utilizing the left greater saphenous vein harvested endoscopically  . Aortic valve replacement N/A 10/08/2012    Procedure: AORTIC VALVE REPLACEMENT (AVR);  Surgeon: Grace Isaac, MD;  Location: [place];  Service: Open Heart Surgery;  Laterality: N/A;  . Maze N/A 10/08/2012    Procedure: MAZE;  Surgeon: Grace Isaac, MD;  Location: [place];  Service: Open Heart Surgery;  Laterality: N/A;  . Intraoperative transesophageal echocardiogram N/A 10/08/2012    Procedure: INTRAOPERATIVE TRANSESOPHAGEAL ECHOCARDIOGRAM;  Surgeon: Grace Isaac, MD;  Location: [place];  Service: Open Heart Surgery;  Laterality: N/A;  . Endovein  harvest of greater saphenous vein Bilateral 10/08/2012    Procedure: ENDOVEIN HARVEST OF GREATER SAPHENOUS VEIN;  Surgeon: Grace Isaac, MD;  Location: [place];  Service: Open Heart Surgery;  Laterality: Bilateral;  . US echocardiography  06/20/2011    mod concentric LVH,LA severely dilated,RA mildly dilated,mod. ca+ of the mitral apparatus,trace MR,mod. ca+ AOV w/stenosis.  Marland Kitchen Nm myocar perf wall motion  08/25/2008    normal  . Cardioversion N/A 02/25/2013    Procedure: CARDIOVERSION;  Surgeon: Sanda Klein, MD;  Location: [place];  Service: Cardiovascular;  Laterality: N/A;  . Tee without cardioversion N/A 09/25/2013    Procedure: TRANSESOPHAGEAL ECHOCARDIOGRAM (TEE);  Surgeon: Thayer Headings, MD;  Location: [place];  Service: Cardiovascular;  Laterality: N/A;  . Cardioversion N/A 09/25/2013    Procedure: CARDIOVERSION;  Surgeon: Thayer Headings, MD;  Location[facility] ENDOSCOPY;  Service: Cardiovascular;  Laterality: N/A;  . Left and right heart catheterization with coronary angiogram N/A 09/20/2012    Procedure: LEFT AND RIGHT HEART CATHETERIZATION WITH CORONARY ANGIOGRAM;  Surgeon: Lorretta Harp, MD;  Location: [facility] CATH LAB;  Service: Cardiovascular;  Laterality: N/A;   Past Medical History  Diagnosis Date  . Hypertension   . Gout   . Aortic stenosis     echo 06/19/08 with nomal LV function, moderate concentric LVH moderate aortic stenosis area 0.99 cm squared, peak gradient of 50 and mean of 31  . Hyperlipidemia   . Atrial fibrillation with RVR, was in atrial fib in 04/2011 09/16/2012    a) s/p MAZE (09/2012)   . Tobacco use 09/16/2012  . Heart murmur   . Chronic combined systolic and diastolic CHF (congestive heart failure) 09/16/2012    a) ECHO (08/2013) EF 50-55%, grade II DD b) TEE ECHO (09/2013): 40-45%, bioprosthesis present, mild MR  . Aortic stenosis, severe 09/23/2012  . CAD (coronary artery disease), single vessel disease 09/23/2012    a) CABG (SVG to PDA and PL) wtih AVR (09/2012)    . PAD (peripheral artery disease), decreased bil. ABIs 09/23/2012    a) ABIs (11/2013): Right mild arterial insufficiency, L normal; aroto-bifem bypass graft: not well visualized, bilateral SFAs = to greater than 50% in dimaeter reduction   . Gout flare. 09/29/12, Lt knee, improved with colchicine. 09/30/2012  . H/O aortic valve replacement   . AS (aortic stenosis) with AVR with pericardial tissue valve  09/30/2013  . Back pain, spinal stenosis 09/30/2013  . COPD (chronic obstructive pulmonary disease)   . Bilateral carotid artery disease    BP 112/47 mmHg  Pulse 61  Resp 14  SpO2 96%  Opioid Risk Score:   Fall Risk Score:   Review of Systems  All other systems  reviewed and are negative.      Objective:   Physical Exam  Constitutional: He is oriented to person, place, and time. He appears well-developed and well-nourished.  HENT:  Head: Normocephalic and atraumatic.  Eyes: Pupils are equal, round, and reactive to light.  Neurological: He is alert and oriented to person, place, and time. Gait abnormal.  Sit to stand with supervision to min assist  Psychiatric: He has a normal mood and affect.  Nursing note and vitals reviewed.   Manual muscle testing 5/5 in the right deltoid, biceps, triceps, grip, hip flexor, knee extensor, ankle dorsiflexor Left side is 3+ at the deltoid biceps triceps grip 4 at the hip flexor and knee extensor ankle dorsiflexor Sensation is absent to light touch in the left upper and left lower limb Intact to light touch in the right upper and right lower limb Mood and affect are appropriate no lability or agitation No speech difficulties. Visual fields are intact to confrontation testing.      Assessment & Plan:  1. Right basal ganglia intracranial hemorrhage while taking Eliquis. He was converted to aspirin by neurology and cardiology. Making good functional progress at skilled nursing facility would recommend continue PT OT. He is still not any modified  independent level. Patient reports that he has no support at home. We will see him back in 4 weeks. Will need home health therapy after discharge from skilled nursing facility.  2. Spasticity improved may discontinue Zanaflex, orders written for skilled nursing facility

## 2014-06-10 ENCOUNTER — Telehealth (HOSPITAL_COMMUNITY): Payer: Self-pay | Admitting: *Deleted

## 2014-06-10 ENCOUNTER — Ambulatory Visit (HOSPITAL_COMMUNITY)
Admission: RE | Admit: 2014-06-10 | Discharge: 2014-06-10 | Disposition: A | Discharge status: Home / Self Care | Payer: 59 | Source: Ambulatory Visit | Attending: Adult Health | Admitting: Adult Health

## 2014-06-10 VITALS — BP 88/46 | HR 55 | Wt 212.8 lb

## 2014-06-10 DIAGNOSIS — M109 Gout, unspecified: Secondary | ICD-10-CM | POA: Insufficient documentation

## 2014-06-10 DIAGNOSIS — Z951 Presence of aortocoronary bypass graft: Secondary | ICD-10-CM | POA: Insufficient documentation

## 2014-06-10 DIAGNOSIS — I350 Nonrheumatic aortic (valve) stenosis: Secondary | ICD-10-CM | POA: Insufficient documentation

## 2014-06-10 DIAGNOSIS — Z79899 Other long term (current) drug therapy: Secondary | ICD-10-CM | POA: Insufficient documentation

## 2014-06-10 DIAGNOSIS — F17200 Nicotine dependence, unspecified, uncomplicated: Secondary | ICD-10-CM | POA: Insufficient documentation

## 2014-06-10 DIAGNOSIS — I429 Cardiomyopathy, unspecified: Secondary | ICD-10-CM | POA: Diagnosis present

## 2014-06-10 DIAGNOSIS — J438 Other emphysema: Secondary | ICD-10-CM

## 2014-06-10 DIAGNOSIS — I5042 Chronic combined systolic (congestive) and diastolic (congestive) heart failure: Secondary | ICD-10-CM | POA: Diagnosis not present

## 2014-06-10 DIAGNOSIS — E785 Hyperlipidemia, unspecified: Secondary | ICD-10-CM | POA: Insufficient documentation

## 2014-06-10 DIAGNOSIS — Z9981 Dependence on supplemental oxygen: Secondary | ICD-10-CM | POA: Diagnosis not present

## 2014-06-10 DIAGNOSIS — I480 Paroxysmal atrial fibrillation: Secondary | ICD-10-CM

## 2014-06-10 DIAGNOSIS — I4891 Unspecified atrial fibrillation: Secondary | ICD-10-CM | POA: Insufficient documentation

## 2014-06-10 DIAGNOSIS — Z952 Presence of prosthetic heart valve: Secondary | ICD-10-CM | POA: Diagnosis not present

## 2014-06-10 DIAGNOSIS — N179 Acute kidney failure, unspecified: Secondary | ICD-10-CM

## 2014-06-10 DIAGNOSIS — I10 Essential (primary) hypertension: Secondary | ICD-10-CM | POA: Diagnosis not present

## 2014-06-10 DIAGNOSIS — I482 Chronic atrial fibrillation: Secondary | ICD-10-CM | POA: Insufficient documentation

## 2014-06-10 DIAGNOSIS — R0602 Shortness of breath: Secondary | ICD-10-CM

## 2014-06-10 DIAGNOSIS — I5022 Chronic systolic (congestive) heart failure: Secondary | ICD-10-CM

## 2014-06-10 DIAGNOSIS — Z7982 Long term (current) use of aspirin: Secondary | ICD-10-CM | POA: Diagnosis not present

## 2014-06-10 DIAGNOSIS — I2510 Atherosclerotic heart disease of native coronary artery without angina pectoris: Secondary | ICD-10-CM | POA: Insufficient documentation

## 2014-06-10 DIAGNOSIS — J449 Chronic obstructive pulmonary disease, unspecified: Secondary | ICD-10-CM | POA: Insufficient documentation

## 2014-06-10 LAB — HEPATIC FUNCTION PANEL
ALK PHOS: 74 U/L (ref 39–117)
ALT: 26 U/L (ref 0–53)
AST: 45 U/L — ABNORMAL HIGH (ref 0–37)
Albumin: 3.2 g/dL — ABNORMAL LOW (ref 3.5–5.2)
BILIRUBIN TOTAL: 0.6 mg/dL (ref 0.3–1.2)
Bilirubin, Direct: 0.1 mg/dL (ref 0.0–0.5)
Indirect Bilirubin: 0.5 mg/dL (ref 0.3–0.9)
TOTAL PROTEIN: 7 g/dL (ref 6.0–8.3)

## 2014-06-10 LAB — BRAIN NATRIURETIC PEPTIDE: B NATRIURETIC PEPTIDE 5: 215 pg/mL — AB (ref 0.0–100.0)

## 2014-06-10 LAB — BASIC METABOLIC PANEL
Anion gap: 6 (ref 5–15)
BUN: 37 mg/dL — ABNORMAL HIGH (ref 6–23)
CALCIUM: 8.9 mg/dL (ref 8.4–10.5)
CO2: 27 mmol/L (ref 19–32)
Chloride: 107 mmol/L (ref 96–112)
Creatinine, Ser: 2.77 mg/dL — ABNORMAL HIGH (ref 0.50–1.35)
GFR calc Af Amer: 26 mL/min — ABNORMAL LOW (ref >90)
GFR, EST NON AFRICAN AMERICAN: 23 mL/min — AB (ref >90)
GLUCOSE: 95 mg/dL (ref 70–99)
Potassium: 5.6 mmol/L — ABNORMAL HIGH (ref 3.5–5.1)
Sodium: 140 mmol/L (ref 135–145)

## 2014-06-10 LAB — TSH: TSH: 4.711 u[IU]/mL — AB (ref 0.350–4.500)

## 2014-06-10 MED ORDER — TORSEMIDE 20 MG PO TABS: ORAL_TABLET | ORAL | Status: DC

## 2014-06-10 MED ORDER — TORSEMIDE 20 MG PO TABS: 20.0000 mg | ORAL_TABLET | Freq: Every day | ORAL | Status: DC

## 2014-06-10 NOTE — Telephone Encounter (Signed)
-----   Message from Conrad McDuffie, NP sent at 06/10/2014 12:15 PM EST ----- Stop potassium. Hold diuretics for 2 days then restart 20 mg daily. He will need BMEt next week at [facility]. LFTs ok. Please ask them to fax results to HF clinic .

## 2014-06-10 NOTE — Patient Instructions (Signed)
Take Torsemide 1 tablet (20mg ) alternating every other day with 2 tablets (40mg ) every other day.  Follow up 1 month.  Do the following things EVERYDAY: 1) Weigh yourself in the morning before breakfast. Write it down and keep it in a log. 2) Take your medicines as prescribed 3) Eat low salt foods-Limit salt (sodium) to 2000 mg per day.  4) Stay as active as you can everyday 5) Limit all fluids for the day to less than 2 liters

## 2014-06-10 NOTE — Telephone Encounter (Signed)
Willis aware

## 2014-06-10 NOTE — Progress Notes (Signed)
Patient ID: [name], male   DOB: 1951/03/24, 64 y.o.   MRN: [id number] EP: Dr Lovena Le   HPI: [name] is 64 y.o. male with past medical history of severe aortic stenosis s/p tissue AV replacement (09/2012) atrial fibrillation s/p MAZE (09/2012)  chronic combined systolic/diastolic HF, RV failure, HTN, PAD s/p L iliac stent in 2010 and aortobifemoral bypass graft, ETOH abuse, severe COPD with cor pulmonale on home oxygen, CAD s/p CABG with AV replacement and back pain from spinal stenosis.   Admitted and treated for acute failure complicated by listeria bacteremia and Afib RVR 09/2013. Diuresed with IV lasix transitioned to po lasix.He had TEE DC-CV but went back into afib with controlled rate on amiodarone and eliquis. He was treated with 2 weeks of antibiotics. Discharge weight was 226 pounds.  Amiodarone was stopped as patient did not hold NSR.    Patient was admitted to [facility] in 12/15 with right basal ganglia intracerebral hemorrhage and left hemiparesis.  He had been on Eliquis.  He eventually was sent to inpatient rehab, where due to a combination of ongoing diuretic use and poor po intake, he developed AKI and hyponatremia.  Diuretics and ACEI were held, and creatinine returned to baseline.    He returns for follow up. Last visit torsemide was increased. Denies SOB/PND/Orthopnea. He remains at [facility]. He continues to improve at rehab.  He is now at [facility] for ongoing rehab.  Able to walk 200 steps. Able to stand on scale. Says his weight is 213 pounds.    ECHO 30-35% 08/2012  ECHO 08/2013 EF 50-55% Grade II DD  TEE 6/15 EF 40-45%, bioprosthetic aortic valve ok  PFTs 9/14 FEV1 1.1L (34%) FVC 2.57L (57%) FEF 25-75% (17%) DLCO 41%  Labs (9/15): LDL 106 Labs (05/05/14): K 3.4, creatinine 1.34, digoxin 0.4  SH: Former Biochemist, clinical. Quit smoking 1 year ago. Drink alcohol occasionally . Denies drug use. Lives alone   FH: Father died MI at 13 Brother MI   ROS: All systems  negative except as listed in HPI, PMH and Problem List.  Past Medical History  Diagnosis Date  . Hypertension   . Gout   . Aortic stenosis     echo 06/19/08 with nomal LV function, moderate concentric LVH moderate aortic stenosis area 0.99 cm squared, peak gradient of 50 and mean of 31  . Hyperlipidemia   . Atrial fibrillation with RVR, was in atrial fib in 04/2011 09/16/2012    a) s/p MAZE (09/2012)   . Tobacco use 09/16/2012  . Heart murmur   . Chronic combined systolic and diastolic CHF (congestive heart failure) 09/16/2012    a) ECHO (08/2013) EF 50-55%, grade II DD b) TEE ECHO (09/2013): 40-45%, bioprosthesis present, mild MR  . Aortic stenosis, severe 09/23/2012  . CAD (coronary artery disease), single vessel disease 09/23/2012    a) CABG (SVG to PDA and PL) wtih AVR (09/2012)   . PAD (peripheral artery disease), decreased bil. ABIs 09/23/2012    a) ABIs (11/2013): Right mild arterial insufficiency, L normal; aroto-bifem bypass graft: not well visualized, bilateral SFAs = to greater than 50% in dimaeter reduction   . Gout flare. 09/29/12, Lt knee, improved with colchicine. 09/30/2012  . H/O aortic valve replacement   . AS (aortic stenosis) with AVR with pericardial tissue valve  09/30/2013  . Back pain, spinal stenosis 09/30/2013  . COPD (chronic obstructive pulmonary disease)   . Bilateral carotid artery disease     Current Outpatient  Prescriptions  Medication Sig Dispense Refill  . albuterol (PROVENTIL HFA;VENTOLIN HFA) 108 (90 BASE) MCG/ACT inhaler Inhale 2 puffs into the lungs every 6 (six) hours as needed for wheezing or shortness of breath. 1 Inhaler 2  . amiodarone (PACERONE) 200 MG tablet Take 200 mg by mouth daily.    Marland Kitchen aspirin EC 81 MG EC tablet Take 1 tablet (81 mg total) by mouth daily.    Marland Kitchen atorvastatin (LIPITOR) 80 MG tablet Take 1 tablet (80 mg total) by mouth daily at 6 PM. 30 tablet 6  . budesonide-formoterol (SYMBICORT) 80-4.5 MCG/ACT inhaler Inhale 2 puffs into the lungs 2 (two)  times daily. 1 Inhaler 12  . carvedilol (COREG) 12.5 MG tablet Take 1 tablet (12.5 mg total) by mouth 2 (two) times daily with a meal. 30 tablet 3  . diclofenac sodium (VOLTAREN) 1 % GEL Apply 2 g topically 4 (four) times daily. To right knee    . digoxin (LANOXIN) 0.125 MG tablet Take 1 tablet (0.125 mg total) by mouth every other day.    . folic acid (FOLVITE) 1 MG tablet Take 1 tablet (1 mg total) by mouth daily. 30 tablet 6  . lisinopril (PRINIVIL,ZESTRIL) 2.5 MG tablet Take 1 tablet (2.5 mg total) by mouth daily.    . mirtazapine (REMERON) 7.5 MG tablet Take 1 tablet (7.5 mg total) by mouth at bedtime.    . OXYGEN-HELIUM IN Inhale 1-2 L into the lungs continuous. Is in the habit of not using his oxygen when he leaves the house. Uses 1L continuous normally Uses 2L for exertion    . pantoprazole (PROTONIX) 40 MG tablet Take 1 tablet (40 mg total) by mouth daily.    . polyethylene glycol (MIRALAX / GLYCOLAX) packet Take 17 g by mouth every other day. 14 each 0  . potassium chloride SA (K-DUR,KLOR-CON) 20 MEQ tablet Take 2 tablets (40 mEq total) by mouth daily. 60 tablet 6  . PRESCRIPTION MEDICATION Apply 1 application topically daily.    Marland Kitchen senna-docusate (SENOKOT-S) 8.6-50 MG per tablet Take 2 tablets by mouth at bedtime.    Marland Kitchen spironolactone (ALDACTONE) 25 MG tablet Take 0.5 tablets (12.5 mg total) by mouth daily. 30 tablet 3  . tiZANidine (ZANAFLEX) 4 MG tablet Take 1 tablet (4 mg total) by mouth 3 (three) times daily. 15 tablet 0  . torsemide (DEMADEX) 20 MG tablet Take 2 tablets (40 mg total) by mouth daily. Take one daily in am 60 tablet 0  . traMADol (ULTRAM) 50 MG tablet Take 1 tablet (50 mg total) by mouth every 6 (six) hours as needed for moderate pain. 15 tablet 0   No current facility-administered medications for this encounter.   Filed Vitals:   06/10/14 1005  BP: 88/46  Pulse: 55  Weight: 212 lb 12 oz (96.503 kg)  SpO2: 95%    PHYSICAL EXAM: General:  Sitting in the wheel  chair. No resp difficulty. Brother present HEENT: normal Neck: supple. JVP 7-8 cm. Carotids 2+ bilaterally; no bruits. No lymphadenopathy or thryomegaly appreciated. Cor: PMI normal. RRR, 2/6 SEM RUSB.  Lungs: barrel chested. diminshed throughout fine crackles in bases Abdomen: soft, nontender, nondistended. No hepatosplenomegaly. No bruits or masses. Good bowel sounds. Extremities: no cyanosis, clubbing, rash. 1+ edema to the knees bilaterally.  Neuro: alert & orientedx3, cranial nerves grossly intact. Left-sided weakness.   ASSESSMENT & PLAN:  1. Biventricular HF (R>L): Suspect mixed ischemic/nonischemic cardiomyopathy,  EF 40-45% on 6/15 TEE.  He has had cor pulmonale with prominent  RV failure from COPD. NYHA class II-III symptoms. Ambulation improving. Still has left-sided weakness.   BP soft. Volume status stable. Cut back torsemide 40 mg daily and alternate with 20 mg. Had AKI in the hospital so will check BMET today.  - Continue digoxin, recent level was ok. - Continue current doses of lisinopril, spironolactone, and Coreg.  2. Severe COPD with cor pulmonale: Using continuous oxygen. Follows with Dr Lake Bells.  3 Chronic A fib: s/p MAZE. Failed DC-CV x2 in November 2014.  Failed DC-CV (09/2013).  However, he was in NSR in 12/15 at time of hospital admission. Regular rate today.  Continue amiodarone 200 mg daily.  Check LFTs/TSH today.  He will need yearly eye exam and baseline PFTs.  - He is off Eliquis with recent intracerebral hemorrhage.  4. CAD: s/p CABG. He is on ASA 81 now that he is off anticoagulation.  Continue atorvastatin 80 mg daily.    5. Intracerebral hemorrhage: Still has left-sided weakness.  He is off Eliquis.  Ongoing rehab.  6. CKD: AKI in hospital resolved, last creatinine 1.3.  Check BMET today.   Follow up in 4 weeks. Check BMET, TSH, LFTs today  Maybelline Kolarik NP-C  06/10/2014

## 2014-06-17 ENCOUNTER — Other Ambulatory Visit: Payer: Self-pay | Admitting: *Deleted

## 2014-06-17 MED ORDER — TRAMADOL HCL 50 MG PO TABS: 50.0000 mg | ORAL_TABLET | Freq: Four times a day (QID) | ORAL | Status: DC | PRN

## 2014-06-19 ENCOUNTER — Telehealth (HOSPITAL_COMMUNITY): Payer: Self-pay | Admitting: *Deleted

## 2014-06-19 NOTE — Telephone Encounter (Signed)
Received labs drawn at [facility], K 4.9 bun 37 cr 2.28 per Darrick Grinder, NP have pt stop spiro and repeat in 1 week, [facility] aware and order faxed to them at [phone]

## 2014-06-26 ENCOUNTER — Non-Acute Institutional Stay (SKILLED_NURSING_FACILITY): Payer: 59 | Admitting: Adult Health

## 2014-06-26 ENCOUNTER — Encounter: Payer: Self-pay | Admitting: Adult Health

## 2014-06-26 DIAGNOSIS — F329 Major depressive disorder, single episode, unspecified: Secondary | ICD-10-CM

## 2014-06-26 DIAGNOSIS — K219 Gastro-esophageal reflux disease without esophagitis: Secondary | ICD-10-CM | POA: Diagnosis not present

## 2014-06-26 DIAGNOSIS — G8190 Hemiplegia, unspecified affecting unspecified side: Secondary | ICD-10-CM | POA: Diagnosis not present

## 2014-06-26 DIAGNOSIS — E785 Hyperlipidemia, unspecified: Secondary | ICD-10-CM | POA: Diagnosis not present

## 2014-06-26 DIAGNOSIS — G8194 Hemiplegia, unspecified affecting left nondominant side: Secondary | ICD-10-CM

## 2014-06-26 DIAGNOSIS — I482 Chronic atrial fibrillation, unspecified: Secondary | ICD-10-CM

## 2014-06-26 DIAGNOSIS — M1711 Unilateral primary osteoarthritis, right knee: Secondary | ICD-10-CM | POA: Diagnosis not present

## 2014-06-26 DIAGNOSIS — K5900 Constipation, unspecified: Secondary | ICD-10-CM

## 2014-06-26 DIAGNOSIS — I639 Cerebral infarction, unspecified: Secondary | ICD-10-CM | POA: Diagnosis not present

## 2014-06-26 DIAGNOSIS — I10 Essential (primary) hypertension: Secondary | ICD-10-CM | POA: Diagnosis not present

## 2014-06-26 DIAGNOSIS — I5022 Chronic systolic (congestive) heart failure: Secondary | ICD-10-CM

## 2014-06-26 DIAGNOSIS — F32A Depression, unspecified: Secondary | ICD-10-CM

## 2014-06-26 DIAGNOSIS — J449 Chronic obstructive pulmonary disease, unspecified: Secondary | ICD-10-CM | POA: Diagnosis not present

## 2014-06-26 NOTE — Progress Notes (Signed)
Patient ID: [name], male   DOB: 1950/10/23, 64 y.o.   MRN: [id number]   06/26/2014  Facility:  Nursing Home Location:  [place] Room Number: 605-P LEVEL OF CARE:  SNF (31)   Chief Complaint  Patient presents with  . Medical Management of Chronic:  Issues  CVA, left hemiparesis, right knee arthritic pain, paroxysmal atrial fibrillation, depression, hyperlipidemia, COPD, GERD, hypertension, CHF and constipation     HISTORY OF PRESENT ILLNESS:  This is a 64 year old male who is being seen on a routine visit. He has been admitted to [facility] on 05/05/14 from [facility]. He has past medical history of hypertension, COPD and atrial fibrillation on Eliquis. He was brought to the hospital with chief complaints of left side weakness and mild slurred speech. CT showed a right IC basal ganglia hemorrhage with a 4 mm right to left midline shift. Eliquis was reversed. Repeat carotid Dopplers done revealing 60-79% left ICA and 40-59% right ICA stenosis. Swallow evaluation done revealing dysphagia and patient was placed on a mechanical soft and nectar thick liquid diet. He was then transferred to rehabilitation on 04/13/14 for inpatient therapies. Stay was complicated by acute renal failure due to dehydration. Gentle hydration and potassium supplementation were given. Renal failure has resolved. Liquids advanced to thin.  He had adjustment reaction with mild depression and was started on Remeron while at rehabilitation. Follow-up CT of head done on 1/10 showed resolution of bleed. Neurology, Dr. Leonie Man, was consulted and restarted aspirin 81 mg on 05/04/14.  PAST MEDICAL HISTORY:  Past Medical History  Diagnosis Date  . Hypertension   . Gout   . Aortic stenosis     echo 06/19/08 with nomal LV function, moderate concentric LVH moderate aortic stenosis area 0.99 cm squared, peak gradient of 50 and mean of 31  . Hyperlipidemia   . Atrial fibrillation with  RVR, was in atrial fib in 04/2011 09/16/2012    a) s/p MAZE (09/2012)   . Tobacco use 09/16/2012  . Heart murmur   . Chronic combined systolic and diastolic CHF (congestive heart failure) 09/16/2012    a) ECHO (08/2013) EF 50-55%, grade II DD b) TEE ECHO (09/2013): 40-45%, bioprosthesis present, mild MR  . Aortic stenosis, severe 09/23/2012  . CAD (coronary artery disease), single vessel disease 09/23/2012    a) CABG (SVG to PDA and PL) wtih AVR (09/2012)   . PAD (peripheral artery disease), decreased bil. ABIs 09/23/2012    a) ABIs (11/2013): Right mild arterial insufficiency, L normal; aroto-bifem bypass graft: not well visualized, bilateral SFAs = to greater than 50% in dimaeter reduction   . Gout flare. 09/29/12, Lt knee, improved with colchicine. 09/30/2012  . H/O aortic valve replacement   . AS (aortic stenosis) with AVR with pericardial tissue valve  09/30/2013  . Back pain, spinal stenosis 09/30/2013  . COPD (chronic obstructive pulmonary disease)   . Bilateral carotid artery disease     CURRENT MEDICATIONS: Reviewed per MAR/see medication list  No Known Allergies   REVIEW OF SYSTEMS:  GENERAL: no change in appetite, no fatigue, no weight changes, no fever, chills  RESPIRATORY: no cough, SOB, DOE, wheezing, hemoptysis CARDIAC: no chest pain, or palpitations GI: no abdominal pain, diarrhea, constipation, heart burn, nausea or vomiting  PHYSICAL EXAMINATION  GENERAL: no acute distress, obese NECK: supple, trachea midline, no neck masses, no thyroid tenderness, no thyromegaly LYMPHATICS: no LAN in the neck, no supraclavicular LAN RESPIRATORY: breathing is  even & unlabored, BS CTAB CARDIAC: RRR, no murmur,no extra heart sounds,BLE edema 1+ GI: abdomen soft, normal BS, no masses, no tenderness, no hepatomegaly, no splenomegaly EXTREMITIES: left hemiparesis; able to move X 4 extremities but has weakness on LUE and LLE PSYCHIATRIC: the patient is alert & oriented to person, affect & behavior  appropriate  LABS/RADIOLOGY: 06/17/14  sodium 139 potassium 4.9 glucose 87 BUN 37 creatinine 2.28 calcium 8.7 05/28/14  cholesterol 101 triglycerides 156 HDL 25 LDL 45 sodium 142 potassium 4.9 glucose 79 BUN 37 creatinine 3.04 calcium 8.6 tsh 3.790 Labs reviewed: Basic Metabolic Panel:  Recent Labs  04/27/14 0820 05/05/14 0458 06/10/14 1047  NA 135 137 140  K 5.0 3.4* 5.6*  CL 102 101 107  CO2 27 30 27   GLUCOSE 149* 87 95  BUN 32* 19 37*  CREATININE 1.40* 1.34 2.77*  CALCIUM 8.6 8.1* 8.9   Liver Function Tests:  Recent Labs  04/07/14 1607 04/09/14 0210 04/22/14 0400 06/10/14 1047  AST 33 84*  --  45*  ALT 21 26  --  26  ALKPHOS 78 66  --  74  BILITOT 0.4 0.4  --  0.6  PROT 7.8 6.7  --  7.0  ALBUMIN 3.3* 2.7* 3.2* 3.2*    Recent Labs  09/25/13 2045  AMMONIA 25   CBC:  Recent Labs  09/30/13 0408 04/07/14 1607 04/07/14 1619 04/09/14 0210  WBC 6.8 6.9  --  6.1  NEUTROABS  --  3.6  --  3.3  HGB 11.0* 10.9* 12.6* 10.3*  HCT 37.5* 35.7* 37.0* 34.2*  MCV 91.7 86.9  --  88.4  PLT 241 215  --  177   Lipid Panel:  Recent Labs  12/31/13 1102  HDL 73   CBG:  Recent Labs  04/13/14 0637 04/13/14 0739 04/13/14 1200  GLUCAP 125* 118* 145*    No results found.  ASSESSMENT/PLAN:  Nontraumatic intracranial hemorrhage - follow-up CT of head showed resolution of bleed; follow-up with neurology Left hemiparesis - continue Zanaflex 4 mg 1 tab by mouth 3 times a day;  continue rehabilitation  COPD - continue O2 at 2 L/minute via nasal cannula; continue Symbicort twice a day and albuterol when necessary  Paroxysmal atrial fibrillation - rate controlled; continue aspirin 81 mg daily, carvedilol and digoxin Chronic systolic CHF - stable; continue carvedilol, lisinopril, Aldactone and Demadex; follow-up with heart failure clinics Depression - mood is stable; continue Remeron Hypokalemia - K2.28; continue K Dur supplementation Constipation - continue MiraLAX and  Senokot S Hyperlipidemia - continue Lipitor 80 mg by mouth daily Osteoarthritis - continue Voltaren 1% gel to right knee QID GERD - continue Protonix 40 mg PO Q D   Goals of care:  Short-term rehabilitation   Labs/test ordered:  none    [place], NP [employer] [phone]

## 2014-06-30 ENCOUNTER — Encounter: Payer: 59 | Attending: Physical Medicine & Rehabilitation

## 2014-06-30 ENCOUNTER — Ambulatory Visit (HOSPITAL_BASED_OUTPATIENT_CLINIC_OR_DEPARTMENT_OTHER): Payer: 59 | Admitting: Physical Medicine & Rehabilitation

## 2014-06-30 ENCOUNTER — Encounter: Payer: Self-pay | Admitting: Physical Medicine & Rehabilitation

## 2014-06-30 VITALS — BP 112/51 | HR 58 | Resp 14

## 2014-06-30 DIAGNOSIS — I69398 Other sequelae of cerebral infarction: Secondary | ICD-10-CM

## 2014-06-30 DIAGNOSIS — R208 Other disturbances of skin sensation: Secondary | ICD-10-CM | POA: Insufficient documentation

## 2014-06-30 DIAGNOSIS — I610 Nontraumatic intracerebral hemorrhage in hemisphere, subcortical: Secondary | ICD-10-CM | POA: Insufficient documentation

## 2014-06-30 DIAGNOSIS — G8190 Hemiplegia, unspecified affecting unspecified side: Secondary | ICD-10-CM

## 2014-06-30 DIAGNOSIS — R269 Unspecified abnormalities of gait and mobility: Secondary | ICD-10-CM | POA: Diagnosis not present

## 2014-06-30 DIAGNOSIS — G8194 Hemiplegia, unspecified affecting left nondominant side: Secondary | ICD-10-CM

## 2014-06-30 DIAGNOSIS — IMO0002 Reserved for concepts with insufficient information to code with codable children: Secondary | ICD-10-CM

## 2014-06-30 NOTE — Patient Instructions (Addendum)
Would recommend stopping Zanaflex, this is a muscle relaxer medicine, if you did start having spasms in back you may resume it

## 2014-06-30 NOTE — Progress Notes (Signed)
Subjective:    Patient ID: [name], male    DOB: 11-May-1950, 64 y.o.   MRN: [id number] Right basal ganglia intracranial hemorrhage while taking Eliquis. He was converted to aspirin by neurology and cardiology. Making good functional progress at skilled nursing facility would recommend continue PT OT. He is still not any modified independent level. Patient reports that he has no support at home. We will see him back in 4 weeks. Will need home health therapy after discharge from skilled nursing facility.  Patient was at comprehensive intensive inpatient rehabilitation at [facility] from 04/13/2014 to 05/05/2013. He was discharged to [facility] at a heavy assist level. Patient was only walking with an equal walker 5 steps with physical therapy upon discharge from the hospital. His motor strength on admission to CIR was 2 minus over 5 in the left upper and left lower extremity Patient reports that PT is currently 6 days per week with OT 5 days per week. He is using a walker with standby assistance and a wheelchair trailing him. He is using no Ankle braces but using a left knee orthosis   HPI Preparing for discharge to home on 07/04/2014 Equipment ordering, now dressing and bathing with supervision not requiring any physical assistance. Plans to go to his own home., No steps to enter Plans to have home health PT OT Family to assist with shopping and cooking  Pain Inventory Average Pain 0 Pain Right Now 0 My pain is no pain  In the last 24 hours, has pain interfered with the following? General activity 0 Relation with others 0 Enjoyment of life 0 What TIME of day is your pain at its worst? no pain Sleep (in general) NA  Pain is worse with: no pain Pain improves with: no pain Relief from Meds: no pain  Mobility walk with assistance use a walker use a wheelchair needs help with transfers  Function I need assistance with the following:  bathing, meal prep, household duties  and shopping  Neuro/Psych No problems in this area  Prior Studies Any changes since last visit?  no  Physicians involved in your care Any changes since last visit?  no   Family History  Problem Relation Age of Onset  . Heart attack Father     died at 57 with MI  . Heart disease Brother   . Heart attack Brother   . Heart attack Brother    History   Social History  . Marital Status: Divorced    Spouse Name: N/A  . Number of Children: N/A  . Years of Education: N/A   Occupational History  . retired    Social History Main Topics  . Smoking status: Former Smoker -- 1.00 packs/day for 43 years    Types: Cigarettes    Quit date: 09/12/2012  . Smokeless tobacco: Never Used     Comment: More than 43 + pack years as he smoked up to 2 1/2 ppd for many years. Had a period of cessation after femoral stent.  . Alcohol Use: 25.2 - 50.4 oz/week    42-84 Cans of beer per week     Comment: 6-12 cans of beer a day  . Drug Use: No  . Sexual Activity: Not on file   Other Topics Concern  . None   Social History Narrative   Past Surgical History  Procedure Laterality Date  . Iliac artery stent  10/20/08    stent to lt iliac  . Aorto bifem bypass  12/18/08    by Dr. Trula Slade  . Coronary artery bypass graft N/A 10/08/2012    Procedure: CORONARY ARTERY BYPASS GRAFTING (CABG);  Surgeon: Grace Isaac, MD;  Location: [place];  Service: Open Heart Surgery;  Laterality: N/A;  Coronary Artery bypass Graft times two utilizing the left greater saphenous vein harvested endoscopically  . Aortic valve replacement N/A 10/08/2012    Procedure: AORTIC VALVE REPLACEMENT (AVR);  Surgeon: Grace Isaac, MD;  Location: [place];  Service: Open Heart Surgery;  Laterality: N/A;  . Maze N/A 10/08/2012    Procedure: MAZE;  Surgeon: Grace Isaac, MD;  Location: [place];  Service: Open Heart Surgery;  Laterality: N/A;  . Intraoperative transesophageal echocardiogram N/A 10/08/2012    Procedure:  INTRAOPERATIVE TRANSESOPHAGEAL ECHOCARDIOGRAM;  Surgeon: Grace Isaac, MD;  Location: [place];  Service: Open Heart Surgery;  Laterality: N/A;  . Endovein harvest of greater saphenous vein Bilateral 10/08/2012    Procedure: ENDOVEIN HARVEST OF GREATER SAPHENOUS VEIN;  Surgeon: Grace Isaac, MD;  Location: [place];  Service: Open Heart Surgery;  Laterality: Bilateral;  . US echocardiography  06/20/2011    mod concentric LVH,LA severely dilated,RA mildly dilated,mod. ca+ of the mitral apparatus,trace MR,mod. ca+ AOV w/stenosis.  Marland Kitchen Nm myocar perf wall motion  08/25/2008    normal  . Cardioversion N/A 02/25/2013    Procedure: CARDIOVERSION;  Surgeon: Sanda Klein, MD;  Location: [place];  Service: Cardiovascular;  Laterality: N/A;  . Tee without cardioversion N/A 09/25/2013    Procedure: TRANSESOPHAGEAL ECHOCARDIOGRAM (TEE);  Surgeon: Thayer Headings, MD;  Location: [place];  Service: Cardiovascular;  Laterality: N/A;  . Cardioversion N/A 09/25/2013    Procedure: CARDIOVERSION;  Surgeon: Thayer Headings, MD;  Location: [facility] ENDOSCOPY;  Service: Cardiovascular;  Laterality: N/A;  . Left and right heart catheterization with coronary angiogram N/A 09/20/2012    Procedure: LEFT AND RIGHT HEART CATHETERIZATION WITH CORONARY ANGIOGRAM;  Surgeon: Lorretta Harp, MD;  Location: [facility] CATH LAB;  Service: Cardiovascular;  Laterality: N/A;   Past Medical History  Diagnosis Date  . Hypertension   . Gout   . Aortic stenosis     echo 06/19/08 with nomal LV function, moderate concentric LVH moderate aortic stenosis area 0.99 cm squared, peak gradient of 50 and mean of 31  . Hyperlipidemia   . Atrial fibrillation with RVR, was in atrial fib in 04/2011 09/16/2012    a) s/p MAZE (09/2012)   . Tobacco use 09/16/2012  . Heart murmur   . Chronic combined systolic and diastolic CHF (congestive heart failure) 09/16/2012    a) ECHO (08/2013) EF 50-55%, grade II DD b) TEE ECHO (09/2013): 40-45%, bioprosthesis present,  mild MR  . Aortic stenosis, severe 09/23/2012  . CAD (coronary artery disease), single vessel disease 09/23/2012    a) CABG (SVG to PDA and PL) wtih AVR (09/2012)   . PAD (peripheral artery disease), decreased bil. ABIs 09/23/2012    a) ABIs (11/2013): Right mild arterial insufficiency, L normal; aroto-bifem bypass graft: not well visualized, bilateral SFAs = to greater than 50% in dimaeter reduction   . Gout flare. 09/29/12, Lt knee, improved with colchicine. 09/30/2012  . H/O aortic valve replacement   . AS (aortic stenosis) with AVR with pericardial tissue valve  09/30/2013  . Back pain, spinal stenosis 09/30/2013  . COPD (chronic obstructive pulmonary disease)   . Bilateral carotid artery disease    BP 112/51 mmHg  Pulse 58  Resp 14  SpO2  96%  Opioid Risk Score:   Fall Risk Score: Moderate Fall Risk (6-13 points) Review of Systems  Respiratory:       O2  Musculoskeletal: Positive for gait problem.  Neurological: Positive for weakness.  All other systems reviewed and are negative.      Objective:   Physical Exam  Constitutional: He is oriented to person, place, and time. He appears well-developed and well-nourished.  Neurological: He is alert and oriented to person, place, and time.  Psychiatric: He has a normal mood and affect.  Nursing note and vitals reviewed.   4+/5 strength in the left deltoid, biceps, triceps, grip 5/5 in the right deltoid, bicep, tricep grip 2+ edema bilateral ankles 4/5 left hip flexor otherwise 5/5 lower extremity 5/5 in the right side. Sit to stand modified independent     Assessment & Plan:  1. Right basal ganglia intracranial hemorrhage with left hemiparesis which has improved to a great degree, left hemisensory deficits are still Severe in the left upper extremity but improving sensation in the left lower extremity. Agree with plan to return to homeWith intermittent supervision from family [facility] and aid  No driving No  alcohol Follow-up in 1 month with me  Follow-up with neurology, appointment scheduled Follow-up with PCP for hypertension and hyperlipidemia Follow-up with cardiology for history of aortic stenosis

## 2014-07-01 ENCOUNTER — Encounter: Payer: Self-pay | Admitting: Neurology

## 2014-07-01 ENCOUNTER — Ambulatory Visit (INDEPENDENT_AMBULATORY_CARE_PROVIDER_SITE_OTHER): Payer: 59 | Admitting: Neurology

## 2014-07-01 VITALS — BP 96/40 | HR 62 | Temp 98.1°F | Ht 71.0 in | Wt 211.0 lb

## 2014-07-01 DIAGNOSIS — I480 Paroxysmal atrial fibrillation: Secondary | ICD-10-CM

## 2014-07-01 DIAGNOSIS — I509 Heart failure, unspecified: Secondary | ICD-10-CM | POA: Diagnosis not present

## 2014-07-01 DIAGNOSIS — I619 Nontraumatic intracerebral hemorrhage, unspecified: Secondary | ICD-10-CM

## 2014-07-01 NOTE — Progress Notes (Signed)
STROKE NEUROLOGY FOLLOW UP NOTE  NAME: [name] DOB: [birth year]  REASON FOR VISIT: stroke follow up HISTORY FROM: chart and pt  Today we had the pleasure of seeing [name] in follow-up at our Neurology Clinic. Pt was accompanied by no one.   History Summary [name] is an 64 y.o. male who has known Afib on Eliquis was admitted on 04/07/14 for suddenly felt dizzy, left facial droop and left sided weakness, arm>leg. CT head showed a right IC basal ganglia hemorrhage with ventricular extension. BP 158/85. s/p Kcentra. Repeat CT brain on 04/08/14 Right thalamic intraparenchymal hematoma stable and without hydrocephalus. CT brain on 04/10/14 of brain revealed Stable intra-axial hemorrhage centered at the right thalamus with small volume intraventricular extension of blood and no ventriculomegaly. Patient continues to have left upper and lower extremity weakness but has improved. Pt was discharged to rehab.   Interval History During the interval time, the patient has been doing better. He stayed in rehab for 3 weeks and then sent to SNF. On 05/03/14 he had repeat CT showed resolution of the ICH and ASA was started. His left LE strength almost back to baseline and he can walk with walker. LUE more weak at the shoulder. He is going to be discharged home next week. He follows with Dr. Haroldine Laws at cardiology next week.     REVIEW OF SYSTEMS: Full 14 system review of systems performed and notable only for those listed below and in HPI above, all others are negative:  Constitutional:   Cardiovascular:  Ear/Nose/Throat:   Skin:  Eyes:   Respiratory:   Gastroitestinal:   Genitourinary:  Hematology/Lymphatic:   Endocrine:  Musculoskeletal:   Allergy/Immunology:   Neurological:   Psychiatric:  Sleep:   The following represents the patient's updated allergies and side effects list: No Known Allergies  The neurologically relevant items on the patient's problem list  were reviewed on today's visit.  Neurologic Examination  A problem focused neurological exam (12 or more points of the single system neurologic examination, vital signs counts as 1 point, cranial nerves count for 8 points) was performed.  Blood pressure 96/40, pulse 62, temperature 98.1 F (36.7 C), temperature source Oral, height 5\' 11"  (1.803 m), weight 211 lb (95.709 kg).  General - Well nourished, well developed, in no apparent distress.  Ophthalmologic - Sharp disc margins OU.  Cardiovascular - Regular rate and rhythm with no murmur.  Mental Status -  Level of arousal and orientation to time, place, and person were intact. Language including expression, naming, repetition, comprehension, reading, and writing was assessed and found intact. Attention span and concentration were normal. Recent and remote memory were 3/3 registration and 2/3 delayed recall. Fund of Knowledge was assessed and was intact.  Cranial Nerves II - XII - II - Visual field intact OU. III, IV, VI - Extraocular movements intact. V - Facial sensation decreased on the left. VII - Facial movement intact bilaterally. VIII - Hearing & vestibular intact bilaterally. X - Palate elevates symmetrically. XI - Chin turning & shoulder shrug intact bilaterally. XII - Tongue protrusion intact.  Motor Strength - The patient's strength was normal in all extremities except left LE 4/5 proximal and pronator drift was absent.  Bulk was normal and fasciculations were absent.   Motor Tone - Muscle tone was assessed at the neck and appendages and was normal.  Reflexes - The patient's reflexes were normal in all extremities and he had no pathological reflexes.  Sensory -  Light touch, temperature/pinprick were assessed and were decreased on the left.    Coordination - The patient had ataxia on FTN at left hand which is out of proportion to the weakness.  Tremor was absent.  Gait and Station - not tested, in  wheelchair.  Data reviewed: I personally reviewed the images and agree with the radiology interpretations.  Ct Head Wo Contrast 04/10/2014  1. Stable intra-axial hemorrhage centered at the right thalamus with small volume intraventricular extension of blood.  2. Stable mild regional mass effect. No ventriculomegaly.  3. No new intracranial abnormality.   04/07/14:  1. Acute posterior RIGHT basal ganglia and internal capsule hemorrhage measuring 15 mm x 23 mm. 2. 4 mm of RIGHT to LEFT midline shift. 3. Area of low attenuation in the posterior LEFT temporal lobe may represent subacute ischemia but the appearance is more suggestive of either an old infarct or asymmetric chronic ischemic white matter disease.  04/08/14: Stable exams inch yesterday. Right thalamic intraparenchymal hematoma again measured at 15 x 24 mm. Mild surrounding edema. Small amount of intraventricular hemorrhage, not increased and without hydrocephalus.  CUS - - The vertebral arteries appear patent with antegrade flow. - Findings consistent with 56 - 63 percent stenosis involving the right internal carotid artery. - Findings consistent with 60 - 79 percent stenosis involving the left internal carotid artery.  EKG Ectopic atrial tachycardia, unifocal, Nonspecific ST and T wave abnormality  Assessment: As you may recall, he is a 64 y.o. Caucasian male with PMH of known Afib on Eliquis was admitted on 04/07/14 for right BG hemorrhage with ventricular extension. BP 158/85. s/p Kcentra. Repeat CT showed stable hematoma without hydrocephalus. Patient continued to have left upper and lower extremity weakness but has improved. Pt was discharged to rehab. In 04/2014 repeat CT showed resolution of hemorrhage and ASA started. Currently, he is close to go home and able to walk with walker. Due to hx of afib and the currently hemorrhage was about 3 month, we need to consider resume anticoagulation. Will discuss  with his cardiologist Dr. Haroldine Laws.   Plan:  - continue ASA and lipitor for stroke prevention for now. - will discuss with pt cardiologist Dr. Haroldine Laws before resuming eliquis. - pt is to follow up with Dr. Haroldine Laws next week and discuss about restarting AC.  - check BP at home. BP goal < 130 to avoid bleeding while on [facility]. - Follow up with primary care physician for stroke risk factor modification. Recommend maintain blood pressure goal <130/80, diabetes with hemoglobin A1c goal below 6.5% and lipids with LDL cholesterol goal below 70 mg/dL.  - RTC in 4 months  No orders of the defined types were placed in this encounter.    Meds ordered this encounter  Medications  . tiZANidine (ZANAFLEX) 4 MG tablet    Sig: Take 4 mg by mouth 2 (two) times daily.    Refill:  1  . spironolactone (ALDACTONE) 25 MG tablet    Sig: Take 25 mg by mouth daily. Take 1/2 tablet daily    Patient Instructions  - continue ASA and lipitor for stroke prevention for now. - as your bleed in the brain has been resolved, to prevent further stroke, you should resume anticoagulation. eliquis still is the safest one in terms of bleeding risk.  - you would like [place] to discuss with your cardiologist Dr. Haroldine Laws before resuming eliquis. I would forward my note to him for review. Please also mention this to Dr. Haroldine Laws when you  see him next week.  - once started on eliquis, please stop ASA - continue other meds for Afib and CHF - check BP at home or nursing home. BP goal < 130 to avoid bleeding while you are on blood thinners - follow up in 4 months.   Rosalin Hawking, MD PhD [facility] [address], [place], [place] [address] [phone]

## 2014-07-01 NOTE — Patient Instructions (Addendum)
-   continue ASA and lipitor for stroke prevention for now. - as your bleed in the brain has been resolved, to prevent further stroke, you should resume anticoagulation. eliquis still is the safest one in terms of bleeding risk.  - you would like [place] to discuss with your cardiologist Dr. Haroldine Laws before resuming eliquis. I would forward my note to him for review. Please also mention this to Dr. Haroldine Laws when you see him next week.  - once started on eliquis, please stop ASA - continue other meds for Afib and CHF - check BP at home or nursing home. BP goal < 130 to avoid bleeding while you are on blood thinners - follow up in 4 months.

## 2014-07-02 DIAGNOSIS — I509 Heart failure, unspecified: Secondary | ICD-10-CM | POA: Insufficient documentation

## 2014-07-02 DIAGNOSIS — I619 Nontraumatic intracerebral hemorrhage, unspecified: Secondary | ICD-10-CM | POA: Insufficient documentation

## 2014-07-03 ENCOUNTER — Non-Acute Institutional Stay (SKILLED_NURSING_FACILITY): Payer: 59 | Admitting: Adult Health

## 2014-07-03 DIAGNOSIS — J449 Chronic obstructive pulmonary disease, unspecified: Secondary | ICD-10-CM

## 2014-07-03 DIAGNOSIS — I639 Cerebral infarction, unspecified: Secondary | ICD-10-CM | POA: Diagnosis not present

## 2014-07-03 DIAGNOSIS — E785 Hyperlipidemia, unspecified: Secondary | ICD-10-CM

## 2014-07-03 DIAGNOSIS — F329 Major depressive disorder, single episode, unspecified: Secondary | ICD-10-CM | POA: Diagnosis not present

## 2014-07-03 DIAGNOSIS — K219 Gastro-esophageal reflux disease without esophagitis: Secondary | ICD-10-CM | POA: Diagnosis not present

## 2014-07-03 DIAGNOSIS — K5900 Constipation, unspecified: Secondary | ICD-10-CM

## 2014-07-03 DIAGNOSIS — G8190 Hemiplegia, unspecified affecting unspecified side: Secondary | ICD-10-CM | POA: Diagnosis not present

## 2014-07-03 DIAGNOSIS — I5022 Chronic systolic (congestive) heart failure: Secondary | ICD-10-CM

## 2014-07-03 DIAGNOSIS — F32A Depression, unspecified: Secondary | ICD-10-CM

## 2014-07-03 DIAGNOSIS — M1711 Unilateral primary osteoarthritis, right knee: Secondary | ICD-10-CM | POA: Diagnosis not present

## 2014-07-03 DIAGNOSIS — G8194 Hemiplegia, unspecified affecting left nondominant side: Secondary | ICD-10-CM

## 2014-07-03 DIAGNOSIS — I482 Chronic atrial fibrillation, unspecified: Secondary | ICD-10-CM

## 2014-07-03 DIAGNOSIS — I10 Essential (primary) hypertension: Secondary | ICD-10-CM

## 2014-07-09 ENCOUNTER — Encounter: Payer: Self-pay | Admitting: Adult Health

## 2014-07-09 ENCOUNTER — Encounter (HOSPITAL_COMMUNITY): Payer: Self-pay

## 2014-07-09 ENCOUNTER — Ambulatory Visit (HOSPITAL_COMMUNITY)
Admission: RE | Admit: 2014-07-09 | Discharge: 2014-07-09 | Disposition: A | Discharge status: Home / Self Care | Payer: 59 | Source: Ambulatory Visit | Attending: Internal Medicine | Admitting: Internal Medicine

## 2014-07-09 VITALS — BP 90/64 | HR 60 | Resp 20 | Wt 205.4 lb

## 2014-07-09 DIAGNOSIS — I480 Paroxysmal atrial fibrillation: Secondary | ICD-10-CM

## 2014-07-09 DIAGNOSIS — I5020 Unspecified systolic (congestive) heart failure: Secondary | ICD-10-CM

## 2014-07-09 DIAGNOSIS — I5042 Chronic combined systolic (congestive) and diastolic (congestive) heart failure: Secondary | ICD-10-CM | POA: Diagnosis not present

## 2014-07-09 LAB — BASIC METABOLIC PANEL
ANION GAP: 7 (ref 5–15)
BUN: 40 mg/dL — ABNORMAL HIGH (ref 6–23)
CHLORIDE: 102 mmol/L (ref 96–112)
CO2: 29 mmol/L (ref 19–32)
Calcium: 8.6 mg/dL (ref 8.4–10.5)
Creatinine, Ser: 2.47 mg/dL — ABNORMAL HIGH (ref 0.50–1.35)
GFR calc Af Amer: 30 mL/min — ABNORMAL LOW (ref >90)
GFR, EST NON AFRICAN AMERICAN: 26 mL/min — AB (ref >90)
GLUCOSE: 94 mg/dL (ref 70–99)
Potassium: 4.9 mmol/L (ref 3.5–5.1)
Sodium: 138 mmol/L (ref 135–145)

## 2014-07-09 MED ORDER — TORSEMIDE 20 MG PO TABS: 20.0000 mg | ORAL_TABLET | Freq: Every day | ORAL | Status: DC

## 2014-07-09 MED ORDER — APIXABAN 5 MG PO TABS: 5.0000 mg | ORAL_TABLET | Freq: Two times a day (BID) | ORAL | Status: DC

## 2014-07-09 NOTE — Progress Notes (Signed)
Patient ID: [name], male   DOB: 26-Jan-1951, 64 y.o.   MRN: [id number] EP: Dr Lovena Le   HPI: [name] is 64 y.o. male with past medical history of severe aortic stenosis s/p tissue AV replacement (09/2012) atrial fibrillation s/p MAZE (09/2012)  chronic combined systolic/diastolic HF, RV failure, HTN, PAD s/p L iliac stent in 2010 and aortobifemoral bypass graft, ETOH abuse, severe COPD with cor pulmonale on home oxygen, CAD s/p CABG with AV replacement and back pain from spinal stenosis.   Admitted and treated for acute failure complicated by listeria bacteremia and Afib RVR 09/2013. Diuresed with IV lasix transitioned to po lasix.He had TEE DC-CV but went back into afib with controlled rate on amiodarone and eliquis. He was treated with 2 weeks of antibiotics. Discharge weight was 226 pounds.  Amiodarone was stopped as patient did not hold NSR.    Patient was [facility] to [facility] in 12/15 with right basal ganglia intracerebral hemorrhage and left hemiparesis.  He had been on Eliquis.  He eventually was sent to inpatient rehab, where due to a combination of ongoing diuretic use and poor po intake, he developed AKI and hyponatremia.  Diuretics and ACEI were held, and creatinine returned to baseline.    He returns for follow up. Last visit his potassium was 5.6 Creatinine 2.7. He was instructed to hold lasix for  2 days then restart torsemide.  Had repeat labs with K 4.9 and creatinine 2.28 so potassium and spiro stopped. However he continued spiro. He is now back at home. [facility] following for HHOT/PT/RN. Essentially wheelchair bound. Only takes a few steps with therapy. Sister and his mom are preparing meals. Takes all medications. Evaluated by Dr Erlinda Hong with recommnedations to start eliquis. Limiting fluids to < 2 liters per day.   ECHO 30-35% 08/2012  ECHO 08/2013 EF 50-55% Grade II DD  TEE 6/15 EF 40-45%, bioprosthetic aortic valve ok  PFTs 9/14 FEV1 1.1L (34%) FVC 2.57L (57%) FEF 25-75%  (17%) DLCO 41%  Labs (9/15): LDL 106 Labs (05/05/14): K 3.4, creatinine 1.34, digoxin 0.4 Labs 06/10/2014: K 5.6 Creatinine 2.7 BNP 215 Labs 06/19/2014: K 4.9 Creatinine 2.28 K and spiro stopped.   SH: Former Biochemist, clinical. Quit smoking 1 year ago. Drink alcohol occasionally . Denies drug use. Lives alone   FH: Father died MI at 3 Brother MI   ROS: All systems negative except as listed in HPI, PMH and Problem List.  Past Medical History  Diagnosis Date  . Hypertension   . Gout   . Aortic stenosis     echo 06/19/08 with nomal LV function, moderate concentric LVH moderate aortic stenosis area 0.99 cm squared, peak gradient of 50 and mean of 31  . Hyperlipidemia   . Atrial fibrillation with RVR, was in atrial fib in 04/2011 09/16/2012    a) s/p MAZE (09/2012)   . Tobacco use 09/16/2012  . Heart murmur   . Chronic combined systolic and diastolic CHF (congestive heart failure) 09/16/2012    a) ECHO (08/2013) EF 50-55%, grade II DD b) TEE ECHO (09/2013): 40-45%, bioprosthesis present, mild Dustin  . Aortic stenosis, severe 09/23/2012  . CAD (coronary artery disease), single vessel disease 09/23/2012    a) CABG (SVG to PDA and PL) wtih AVR (09/2012)   . PAD (peripheral artery disease), decreased bil. ABIs 09/23/2012    a) ABIs (11/2013): Right mild arterial insufficiency, L normal; aroto-bifem bypass graft: not well visualized, bilateral SFAs = to greater than 50% in dimaeter  reduction   . Gout flare. 09/29/12, Lt knee, improved with colchicine. 09/30/2012  . H/O aortic valve replacement   . AS (aortic stenosis) with AVR with pericardial tissue valve  09/30/2013  . Back pain, spinal stenosis 09/30/2013  . COPD (chronic obstructive pulmonary disease)   . Bilateral carotid artery disease     Current Outpatient Prescriptions  Medication Sig Dispense Refill  . albuterol (PROVENTIL HFA;VENTOLIN HFA) 108 (90 BASE) MCG/ACT inhaler Inhale 2 puffs into the lungs every 6 (six) hours as needed for wheezing or shortness of  breath. 1 Inhaler 2  . amiodarone (PACERONE) 200 MG tablet Take 200 mg by mouth daily.    Marland Kitchen aspirin EC 81 MG EC tablet Take 1 tablet (81 mg total) by mouth daily.    Marland Kitchen atorvastatin (LIPITOR) 80 MG tablet Take 1 tablet (80 mg total) by mouth daily at 6 PM. 30 tablet 6  . budesonide-formoterol (SYMBICORT) 80-4.5 MCG/ACT inhaler Inhale 2 puffs into the lungs 2 (two) times daily. 1 Inhaler 12  . carvedilol (COREG) 12.5 MG tablet Take 1 tablet (12.5 mg total) by mouth 2 (two) times daily with a meal. 30 tablet 3  . diclofenac sodium (VOLTAREN) 1 % GEL Apply 2 g topically 4 (four) times daily. To right knee    . digoxin (LANOXIN) 0.125 MG tablet Take 1 tablet (0.125 mg total) by mouth every other day.    . folic acid (FOLVITE) 1 MG tablet Take 1 tablet (1 mg total) by mouth daily. 30 tablet 6  . lisinopril (PRINIVIL,ZESTRIL) 2.5 MG tablet Take 1 tablet (2.5 mg total) by mouth daily.    . mirtazapine (REMERON) 7.5 MG tablet Take 1 tablet (7.5 mg total) by mouth at bedtime.    . OXYGEN-HELIUM IN Inhale 1-2 L into the lungs continuous. Is in the habit of not using his oxygen when he leaves the house. Uses 1L continuous normally Uses 2L for exertion    . pantoprazole (PROTONIX) 40 MG tablet Take 1 tablet (40 mg total) by mouth daily.    . polyethylene glycol (MIRALAX / GLYCOLAX) packet Take 17 g by mouth every other day. 14 each 0  . PRESCRIPTION MEDICATION Apply 1 application topically daily.    Marland Kitchen senna-docusate (SENOKOT-S) 8.6-50 MG per tablet Take 2 tablets by mouth at bedtime.    Marland Kitchen spironolactone (ALDACTONE) 25 MG tablet Take 25 mg by mouth daily. Take 1/2 tablet daily    . tiZANidine (ZANAFLEX) 4 MG tablet Take 4 mg by mouth 2 (two) times daily.  1  . torsemide (DEMADEX) 20 MG tablet Take 1 tablet (20 mg total) by mouth daily. Take 1 tablet (20mg ) every other day alternating with 2 tablets (40mg ) every other day. 60 tablet 3  . traMADol (ULTRAM) 50 MG tablet Take 1 tablet (50 mg total) by mouth every  6 (six) hours as needed for moderate pain. 120 tablet 2   No current facility-administered medications for this encounter.   Filed Vitals:   07/09/14 1052  BP: 90/64  Pulse: 60  Resp: 20  Weight: 205 lb 7 oz (93.186 kg)  SpO2: 90%    PHYSICAL EXAM: General:  Sitting in the wheel chair. No resp difficulty. Brother present HEENT: normal Neck: supple. JVP ~8 cm. Carotids 2+ bilaterally; no bruits. No lymphadenopathy or thryomegaly appreciated. Cor: PMI normal. RRR, 2/6 SEM RUSB.  Lungs: barrel chested. diminshed throughout fine crackles in bases Abdomen: soft, nontender, nondistended. No hepatosplenomegaly. No bruits or masses. Good bowel sounds. Extremities: no  cyanosis, clubbing, rash. LLE 1+ RLE tr-1+ edema.   Neuro: alert & orientedx3, cranial nerves grossly intact. Left-sided weakness.   ASSESSMENT & PLAN:  1. Biventricular HF (R>L): Suspect mixed ischemic/nonischemic cardiomyopathy,  EF 40-45% on 6/15 TEE.  He has had cor pulmonale with prominent RV failure from COPD. NYHA class II-III symptoms. . Still has left-sided weakness.   BP soft. Volume status ok. Continue torsemide 20 mg in am and stop pm torsemide until BMET results reviewed.   - Continue digoxin, recent level was ok. - Will adjust meds after we have lab results.  2. Severe COPD with cor pulmonale: Using continuous oxygen. Follows with Dr Lake Bells.  3 Chronic A fib: s/p MAZE. Failed DC-CV x2 in November 2014.  Failed DC-CV (09/2013).  However, he was in NSR in 12/15 at time of hospital admission.  Continue amiodarone 200 mg daily.  He will need yearly eye exam and baseline PFTs.  -In the past he had difficulty with coumadin and regulating INR. Ok to restart eliquis per neurology. Start eliquis 5 mg twice a day . Have discussed coumadin versus eliquis.   4. CAD: s/p CABG bioprosthetic AVR.  Stop aspirin. He had difficulty with coumadin He is on ASA 81 now that he is off anticoagulation.  Continue atorvastatin 80 mg daily.     5. Intracerebral hemorrhage: Still has left-sided weakness.  He is off Eliquis.  Discussed anticoagulant options. In the past he had difficulty with coumadin and regulating INR. Ok to restart eliquis per neuro.   6. CKD: Check BMET today.    Follow up in 8  weeks.   CLEGG,AMY NP-C  07/09/2014  Patient seen and examined with Darrick Grinder, NP. We discussed all aspects of the encounter. I agree with the assessment and plan as stated above.   Improving slowly. Agree with HF med adjustment as above. Long talk about the pros/cons of restarting anticoagulation in light of AF and recent hemorrhagic CVA. We will restart Eliquis.   Daniel Bensimhon,MD 12:09 AM

## 2014-07-09 NOTE — Progress Notes (Signed)
Patient ID: [name], male   DOB: 11-Dec-1950, 64 y.o.   MRN: [id number]   07/03/14  Facility:  Nursing Home Location:  [place] Room Number: 605-P LEVEL OF CARE:  SNF (31)   Chief Complaint  Patient presents with  . Discharge Notes:  CVA, left hemiparesis, right knee arthritic pain, paroxysmal atrial fibrillation, depression, hyperlipidemia, COPD, GERD, hypertension, CHF and constipation     HISTORY OF PRESENT ILLNESS:  This is a 64 year old male who is for discharge home with home health PT, OT,CNA and nurse. DME: Standard wheelchair with elevating leg rests and cushion for mobility and 3 in 1 bedside commode.  He has been admitted to [facility] on 05/05/14 from [facility]. He has past medical history of hypertension, COPD and atrial fibrillation on Eliquis. He was brought to the hospital with chief complaints of left side weakness and mild slurred speech. CT showed a right IC basal ganglia hemorrhage with a 4 mm right to left midline shift. Eliquis was reversed. Repeat carotid Dopplers done revealing 60-79% left ICA and 40-59% right ICA stenosis. Swallow evaluation done revealing dysphagia and patient was placed on a mechanical soft and nectar thick liquid diet. He was then transferred to rehabilitation on 04/13/14 for inpatient therapies. Stay was complicated by acute renal failure due to dehydration. Gentle hydration and potassium supplementation were given. Renal failure has resolved. Liquids advanced to thin.  He had adjustment reaction with mild depression and was started on Remeron while at rehabilitation. Follow-up CT of head done on 1/10 showed resolution of bleed. Neurology, Dr. Leonie Man, was consulted and restarted aspirin 81 mg on 05/04/14.  Patient was admitted to this facility for short-term rehabilitation after the patient's recent hospitalization.  Patient has completed SNF rehabilitation and therapy has cleared the patient for  discharge.  PAST MEDICAL HISTORY:  Past Medical History  Diagnosis Date  . Hypertension   . Gout   . Aortic stenosis     echo 06/19/08 with nomal LV function, moderate concentric LVH moderate aortic stenosis area 0.99 cm squared, peak gradient of 50 and mean of 31  . Hyperlipidemia   . Atrial fibrillation with RVR, was in atrial fib in 04/2011 09/16/2012    a) s/p MAZE (09/2012)   . Tobacco use 09/16/2012  . Heart murmur   . Chronic combined systolic and diastolic CHF (congestive heart failure) 09/16/2012    a) ECHO (08/2013) EF 50-55%, grade II DD b) TEE ECHO (09/2013): 40-45%, bioprosthesis present, mild MR  . Aortic stenosis, severe 09/23/2012  . CAD (coronary artery disease), single vessel disease 09/23/2012    a) CABG (SVG to PDA and PL) wtih AVR (09/2012)   . PAD (peripheral artery disease), decreased bil. ABIs 09/23/2012    a) ABIs (11/2013): Right mild arterial insufficiency, L normal; aroto-bifem bypass graft: not well visualized, bilateral SFAs = to greater than 50% in dimaeter reduction   . Gout flare. 09/29/12, Lt knee, improved with colchicine. 09/30/2012  . H/O aortic valve replacement   . AS (aortic stenosis) with AVR with pericardial tissue valve  09/30/2013  . Back pain, spinal stenosis 09/30/2013  . COPD (chronic obstructive pulmonary disease)   . Bilateral carotid artery disease     CURRENT MEDICATIONS: Reviewed per MAR/see medication list  No Known Allergies   REVIEW OF SYSTEMS:  GENERAL: no change in appetite, no fatigue, no weight changes, no fever, chills  RESPIRATORY: no cough, SOB, DOE, wheezing, hemoptysis CARDIAC: no chest  pain, or palpitations GI: no abdominal pain, diarrhea, constipation, heart burn, nausea or vomiting  PHYSICAL EXAMINATION  GENERAL: no acute distress, obese NECK: supple, trachea midline, no neck masses, no thyroid tenderness, no thyromegaly RESPIRATORY: breathing is even & unlabored, BS CTAB CARDIAC: RRR, no murmur,no extra heart sounds,BLE edema  1+ GI: abdomen soft, normal BS, no masses, no tenderness, no hepatomegaly, no splenomegaly EXTREMITIES: left hemiparesis; able to move X 4 extremities but has weakness on LUE and LLE PSYCHIATRIC: the patient is alert & oriented to person, affect & behavior appropriate  LABS/RADIOLOGY: 06/26/14  sodium 140 potassium 4.2 glucose 82 BUN 38 creatinine 2.37 calcium 9.0 06/25/14  sodium 139 potassium 4.6 glucose 85 BUN 36 creatinine 2.22 calcium 8.9 06/17/14  sodium 139 potassium 4.9 glucose 87 BUN 37 creatinine 2.28 calcium 8.7 05/28/14  cholesterol 101 triglycerides 156 HDL 25 LDL 45 sodium 142 potassium 4.9 glucose 79 BUN 37 creatinine 3.04 calcium 8.6 tsh 3.790 Labs reviewed: Basic Metabolic Panel:  Recent Labs  05/05/14 0458 06/10/14 1047 07/09/14 1100  NA 137 140 138  K 3.4* 5.6* 4.9  CL 101 107 102  CO2 30 27 29   GLUCOSE 87 95 94  BUN 19 37* 40*  CREATININE 1.34 2.77* 2.47*  CALCIUM 8.1* 8.9 8.6   Liver Function Tests:  Recent Labs  04/07/14 1607 04/09/14 0210 04/22/14 0400 06/10/14 1047  AST 33 84*  --  45*  ALT 21 26  --  26  ALKPHOS 78 66  --  74  BILITOT 0.4 0.4  --  0.6  PROT 7.8 6.7  --  7.0  ALBUMIN 3.3* 2.7* 3.2* 3.2*    Recent Labs  09/25/13 2045  AMMONIA 25   CBC:  Recent Labs  09/30/13 0408 04/07/14 1607 04/07/14 1619 04/09/14 0210  WBC 6.8 6.9  --  6.1  NEUTROABS  --  3.6  --  3.3  HGB 11.0* 10.9* 12.6* 10.3*  HCT 37.5* 35.7* 37.0* 34.2*  MCV 91.7 86.9  --  88.4  PLT 241 215  --  177   Lipid Panel:  Recent Labs  12/31/13 1102  HDL 73   CBG:  Recent Labs  04/13/14 0637 04/13/14 0739 04/13/14 1200  GLUCAP 125* 118* 145*     ASSESSMENT/PLAN:  Post hemorrhagic stroke - follow-up CT of head showed resolution of bleed; follow-up with neurology Left hemiparesis - continue Zanaflex 4 mg 1 tab by mouth 3 times a day;  for home health PT, OT, CNA and nurse  COPD - continue O2 at 2 L/minute via nasal cannula; continue Symbicort twice  a day and albuterol when necessary  Paroxysmal atrial fibrillation - rate controlled; continue aspirin 81 mg daily, carvedilol and digoxin Chronic systolic CHF - stable; continue carvedilol, lisinopril and Demadex; follow-up with heart failure clinics Depression - mood is stable; continue Remeron Hypokalemia - K4.5; continue K Dur supplementation Constipation - continue MiraLAX and Senokot S Hyperlipidemia - continue Lipitor 80 mg by mouth daily Osteoarthritis - continue Voltaren 1% gel to right knee QID GERD - continue Protonix 40 mg PO Q D    I have filled out patient's discharge paperwork and written prescriptions.  Patient will receive home health PT, OT, Nursing and CNA.  DME provided:  Standard wheelchair with elevating leg rests and cushion for mobility and 3 in 1 bedside commode  Total discharge time: Greater than 30 minutes  Discharge time involved coordination of the discharge process with social worker, nursing staff and therapy department. Medical justification  for home health services/DME verified.     [facility], NP [employer] [phone]

## 2014-07-09 NOTE — Patient Instructions (Addendum)
START Eliquis 5mg  ( 1 tablet) twice a day  STOP p.m Torsemide   FOLLOW UP in 8 weeks.

## 2014-07-13 ENCOUNTER — Telehealth (HOSPITAL_COMMUNITY): Payer: Self-pay | Admitting: Vascular Surgery

## 2014-07-13 NOTE — Telephone Encounter (Signed)
These refills should be refilled, addressed be PCP

## 2014-07-13 NOTE — Telephone Encounter (Signed)
Pt needs a new prescription Remron, Senokot.. Please call pt when refills are called in.Marland Kitchen

## 2014-07-30 ENCOUNTER — Encounter: Payer: Self-pay | Admitting: Physical Medicine & Rehabilitation

## 2014-07-30 ENCOUNTER — Ambulatory Visit: Payer: 59 | Admitting: Physical Medicine & Rehabilitation

## 2014-07-30 NOTE — Progress Notes (Unsigned)
  PROCEDURE RECORD [facility]   Name: [name] DOB:04/17/1951 MRN: [id number]  Date:07/30/2014  Physician: Alysia Penna, MD    Nurse/CMA: MaryBeh Dreden Rivere  Allergies: No Known Allergies  Consent Signed: Yes.    Is patient diabetic? No.  CBG today? .  Pregnant: No. LMP: No LMP for male patient. (age 65-55)  Anticoagulants: Eliquis Anti-inflammatory: no Antibiotics: no  Procedure: Right Sacroiliac Injection  Position: Prone Start Time:   End Time:  Fluoro Time:   RN/CMA MaryBeh Graci Hulce MaryBeh Syana Degraffenreid    Time 9:48     BP 104/48     Pulse 56     Respirations 14 14    O2 Sat 94     S/S 6 6    Pain Level 3/10      D/C home with brother, patient A & O X 3, D/C instructions reviewed, and sits independently.

## 2014-07-30 NOTE — Progress Notes (Unsigned)
Pt still on Eliquis will reschedule

## 2014-08-01 ENCOUNTER — Other Ambulatory Visit: Payer: Self-pay | Admitting: Adult Health

## 2014-08-03 ENCOUNTER — Telehealth (HOSPITAL_COMMUNITY): Payer: Self-pay | Admitting: Vascular Surgery

## 2014-08-03 DIAGNOSIS — I5022 Chronic systolic (congestive) heart failure: Secondary | ICD-10-CM

## 2014-08-03 MED ORDER — AMIODARONE HCL 200 MG PO TABS: 200.0000 mg | ORAL_TABLET | Freq: Every day | ORAL | Status: DC

## 2014-08-03 MED ORDER — LISINOPRIL 2.5 MG PO TABS: 2.5000 mg | ORAL_TABLET | Freq: Every day | ORAL | Status: DC

## 2014-08-03 NOTE — Telephone Encounter (Signed)
Advised pt to get refills for zanaflex and tramadol from PCP,

## 2014-08-03 NOTE — Telephone Encounter (Signed)
Pt states pharmacy has been trying to contact this office no reply from last week Refill tramadol , amiodarone , tenazepam , lisinopril , pantoprazole, [facility] [address] please advise

## 2014-08-07 ENCOUNTER — Other Ambulatory Visit: Payer: Self-pay | Admitting: *Deleted

## 2014-08-07 MED ORDER — TRAMADOL HCL 50 MG PO TABS
50.0000 mg | ORAL_TABLET | Freq: Four times a day (QID) | ORAL | Status: DC | PRN
Start: 2014-08-07 — End: 2014-11-02

## 2014-08-07 NOTE — Telephone Encounter (Signed)
Pt called has an up coming appt on May 3rd with Dr. Letta Pate for a steroid injection. He is going to run out of his tramadol and is asking if we could get him a refill for his rx.  I consulted Sybil, and she said we could go ahead and phone in a refill. Rx phoned in, pt notified

## 2014-08-10 DIAGNOSIS — M4800 Spinal stenosis, site unspecified: Secondary | ICD-10-CM | POA: Diagnosis not present

## 2014-08-10 DIAGNOSIS — J449 Chronic obstructive pulmonary disease, unspecified: Secondary | ICD-10-CM | POA: Diagnosis not present

## 2014-08-10 DIAGNOSIS — M6281 Muscle weakness (generalized): Secondary | ICD-10-CM | POA: Diagnosis not present

## 2014-08-10 DIAGNOSIS — I69354 Hemiplegia and hemiparesis following cerebral infarction affecting left non-dominant side: Secondary | ICD-10-CM | POA: Diagnosis not present

## 2014-08-10 DIAGNOSIS — I5042 Chronic combined systolic (congestive) and diastolic (congestive) heart failure: Secondary | ICD-10-CM | POA: Diagnosis not present

## 2014-08-10 DIAGNOSIS — I2510 Atherosclerotic heart disease of native coronary artery without angina pectoris: Secondary | ICD-10-CM | POA: Diagnosis not present

## 2014-08-11 DIAGNOSIS — I5042 Chronic combined systolic (congestive) and diastolic (congestive) heart failure: Secondary | ICD-10-CM | POA: Diagnosis not present

## 2014-08-11 DIAGNOSIS — I2510 Atherosclerotic heart disease of native coronary artery without angina pectoris: Secondary | ICD-10-CM | POA: Diagnosis not present

## 2014-08-11 DIAGNOSIS — M4800 Spinal stenosis, site unspecified: Secondary | ICD-10-CM | POA: Diagnosis not present

## 2014-08-11 DIAGNOSIS — M6281 Muscle weakness (generalized): Secondary | ICD-10-CM | POA: Diagnosis not present

## 2014-08-11 DIAGNOSIS — I69354 Hemiplegia and hemiparesis following cerebral infarction affecting left non-dominant side: Secondary | ICD-10-CM | POA: Diagnosis not present

## 2014-08-11 DIAGNOSIS — J449 Chronic obstructive pulmonary disease, unspecified: Secondary | ICD-10-CM | POA: Diagnosis not present

## 2014-08-12 DIAGNOSIS — M4800 Spinal stenosis, site unspecified: Secondary | ICD-10-CM | POA: Diagnosis not present

## 2014-08-12 DIAGNOSIS — I2510 Atherosclerotic heart disease of native coronary artery without angina pectoris: Secondary | ICD-10-CM | POA: Diagnosis not present

## 2014-08-12 DIAGNOSIS — M6281 Muscle weakness (generalized): Secondary | ICD-10-CM | POA: Diagnosis not present

## 2014-08-12 DIAGNOSIS — I5042 Chronic combined systolic (congestive) and diastolic (congestive) heart failure: Secondary | ICD-10-CM | POA: Diagnosis not present

## 2014-08-12 DIAGNOSIS — J449 Chronic obstructive pulmonary disease, unspecified: Secondary | ICD-10-CM | POA: Diagnosis not present

## 2014-08-12 DIAGNOSIS — I69354 Hemiplegia and hemiparesis following cerebral infarction affecting left non-dominant side: Secondary | ICD-10-CM | POA: Diagnosis not present

## 2014-08-13 DIAGNOSIS — I5042 Chronic combined systolic (congestive) and diastolic (congestive) heart failure: Secondary | ICD-10-CM | POA: Diagnosis not present

## 2014-08-13 DIAGNOSIS — I2510 Atherosclerotic heart disease of native coronary artery without angina pectoris: Secondary | ICD-10-CM | POA: Diagnosis not present

## 2014-08-13 DIAGNOSIS — J449 Chronic obstructive pulmonary disease, unspecified: Secondary | ICD-10-CM | POA: Diagnosis not present

## 2014-08-13 DIAGNOSIS — I69354 Hemiplegia and hemiparesis following cerebral infarction affecting left non-dominant side: Secondary | ICD-10-CM | POA: Diagnosis not present

## 2014-08-13 DIAGNOSIS — M4800 Spinal stenosis, site unspecified: Secondary | ICD-10-CM | POA: Diagnosis not present

## 2014-08-13 DIAGNOSIS — M6281 Muscle weakness (generalized): Secondary | ICD-10-CM | POA: Diagnosis not present

## 2014-08-17 DIAGNOSIS — I69354 Hemiplegia and hemiparesis following cerebral infarction affecting left non-dominant side: Secondary | ICD-10-CM | POA: Diagnosis not present

## 2014-08-17 DIAGNOSIS — I2510 Atherosclerotic heart disease of native coronary artery without angina pectoris: Secondary | ICD-10-CM | POA: Diagnosis not present

## 2014-08-17 DIAGNOSIS — J449 Chronic obstructive pulmonary disease, unspecified: Secondary | ICD-10-CM | POA: Diagnosis not present

## 2014-08-17 DIAGNOSIS — M6281 Muscle weakness (generalized): Secondary | ICD-10-CM | POA: Diagnosis not present

## 2014-08-17 DIAGNOSIS — I5042 Chronic combined systolic (congestive) and diastolic (congestive) heart failure: Secondary | ICD-10-CM | POA: Diagnosis not present

## 2014-08-17 DIAGNOSIS — M4800 Spinal stenosis, site unspecified: Secondary | ICD-10-CM | POA: Diagnosis not present

## 2014-08-18 DIAGNOSIS — M6281 Muscle weakness (generalized): Secondary | ICD-10-CM | POA: Diagnosis not present

## 2014-08-18 DIAGNOSIS — I619 Nontraumatic intracerebral hemorrhage, unspecified: Secondary | ICD-10-CM | POA: Diagnosis not present

## 2014-08-18 DIAGNOSIS — M4800 Spinal stenosis, site unspecified: Secondary | ICD-10-CM | POA: Diagnosis not present

## 2014-08-18 DIAGNOSIS — I5042 Chronic combined systolic (congestive) and diastolic (congestive) heart failure: Secondary | ICD-10-CM | POA: Diagnosis not present

## 2014-08-18 DIAGNOSIS — I2510 Atherosclerotic heart disease of native coronary artery without angina pectoris: Secondary | ICD-10-CM | POA: Diagnosis not present

## 2014-08-18 DIAGNOSIS — J449 Chronic obstructive pulmonary disease, unspecified: Secondary | ICD-10-CM | POA: Diagnosis not present

## 2014-08-18 DIAGNOSIS — I6359 Cerebral infarction due to unspecified occlusion or stenosis of other cerebral artery: Secondary | ICD-10-CM | POA: Diagnosis not present

## 2014-08-18 DIAGNOSIS — I69354 Hemiplegia and hemiparesis following cerebral infarction affecting left non-dominant side: Secondary | ICD-10-CM | POA: Diagnosis not present

## 2014-08-18 DIAGNOSIS — I5022 Chronic systolic (congestive) heart failure: Secondary | ICD-10-CM | POA: Diagnosis not present

## 2014-08-19 DIAGNOSIS — J449 Chronic obstructive pulmonary disease, unspecified: Secondary | ICD-10-CM | POA: Diagnosis not present

## 2014-08-19 DIAGNOSIS — I5042 Chronic combined systolic (congestive) and diastolic (congestive) heart failure: Secondary | ICD-10-CM | POA: Diagnosis not present

## 2014-08-19 DIAGNOSIS — M4800 Spinal stenosis, site unspecified: Secondary | ICD-10-CM | POA: Diagnosis not present

## 2014-08-19 DIAGNOSIS — I69354 Hemiplegia and hemiparesis following cerebral infarction affecting left non-dominant side: Secondary | ICD-10-CM | POA: Diagnosis not present

## 2014-08-19 DIAGNOSIS — I2510 Atherosclerotic heart disease of native coronary artery without angina pectoris: Secondary | ICD-10-CM | POA: Diagnosis not present

## 2014-08-19 DIAGNOSIS — M6281 Muscle weakness (generalized): Secondary | ICD-10-CM | POA: Diagnosis not present

## 2014-08-20 ENCOUNTER — Other Ambulatory Visit: Payer: Self-pay | Admitting: Cardiovascular Disease

## 2014-08-20 DIAGNOSIS — M4800 Spinal stenosis, site unspecified: Secondary | ICD-10-CM | POA: Diagnosis not present

## 2014-08-20 DIAGNOSIS — I69354 Hemiplegia and hemiparesis following cerebral infarction affecting left non-dominant side: Secondary | ICD-10-CM | POA: Diagnosis not present

## 2014-08-20 DIAGNOSIS — M6281 Muscle weakness (generalized): Secondary | ICD-10-CM | POA: Diagnosis not present

## 2014-08-20 DIAGNOSIS — I2510 Atherosclerotic heart disease of native coronary artery without angina pectoris: Secondary | ICD-10-CM | POA: Diagnosis not present

## 2014-08-20 DIAGNOSIS — I739 Peripheral vascular disease, unspecified: Secondary | ICD-10-CM

## 2014-08-20 DIAGNOSIS — J449 Chronic obstructive pulmonary disease, unspecified: Secondary | ICD-10-CM | POA: Diagnosis not present

## 2014-08-20 DIAGNOSIS — I5042 Chronic combined systolic (congestive) and diastolic (congestive) heart failure: Secondary | ICD-10-CM | POA: Diagnosis not present

## 2014-08-24 DIAGNOSIS — M6281 Muscle weakness (generalized): Secondary | ICD-10-CM | POA: Diagnosis not present

## 2014-08-24 DIAGNOSIS — I2510 Atherosclerotic heart disease of native coronary artery without angina pectoris: Secondary | ICD-10-CM | POA: Diagnosis not present

## 2014-08-24 DIAGNOSIS — I69354 Hemiplegia and hemiparesis following cerebral infarction affecting left non-dominant side: Secondary | ICD-10-CM | POA: Diagnosis not present

## 2014-08-24 DIAGNOSIS — I5042 Chronic combined systolic (congestive) and diastolic (congestive) heart failure: Secondary | ICD-10-CM | POA: Diagnosis not present

## 2014-08-24 DIAGNOSIS — J449 Chronic obstructive pulmonary disease, unspecified: Secondary | ICD-10-CM | POA: Diagnosis not present

## 2014-08-24 DIAGNOSIS — M4800 Spinal stenosis, site unspecified: Secondary | ICD-10-CM | POA: Diagnosis not present

## 2014-08-25 ENCOUNTER — Encounter: Payer: Self-pay | Admitting: Physical Medicine & Rehabilitation

## 2014-08-25 ENCOUNTER — Ambulatory Visit (HOSPITAL_BASED_OUTPATIENT_CLINIC_OR_DEPARTMENT_OTHER): Payer: Medicare Other | Admitting: Physical Medicine & Rehabilitation

## 2014-08-25 ENCOUNTER — Encounter: Payer: Medicare Other | Attending: Physical Medicine & Rehabilitation

## 2014-08-25 VITALS — BP 116/30 | HR 48 | Resp 14

## 2014-08-25 DIAGNOSIS — M7582 Other shoulder lesions, left shoulder: Secondary | ICD-10-CM

## 2014-08-25 DIAGNOSIS — J449 Chronic obstructive pulmonary disease, unspecified: Secondary | ICD-10-CM | POA: Diagnosis not present

## 2014-08-25 DIAGNOSIS — M6281 Muscle weakness (generalized): Secondary | ICD-10-CM | POA: Diagnosis not present

## 2014-08-25 DIAGNOSIS — I2510 Atherosclerotic heart disease of native coronary artery without angina pectoris: Secondary | ICD-10-CM | POA: Diagnosis not present

## 2014-08-25 DIAGNOSIS — I69354 Hemiplegia and hemiparesis following cerebral infarction affecting left non-dominant side: Secondary | ICD-10-CM | POA: Diagnosis not present

## 2014-08-25 DIAGNOSIS — I69398 Other sequelae of cerebral infarction: Secondary | ICD-10-CM | POA: Diagnosis not present

## 2014-08-25 DIAGNOSIS — I5042 Chronic combined systolic (congestive) and diastolic (congestive) heart failure: Secondary | ICD-10-CM | POA: Diagnosis not present

## 2014-08-25 DIAGNOSIS — R208 Other disturbances of skin sensation: Secondary | ICD-10-CM | POA: Diagnosis not present

## 2014-08-25 DIAGNOSIS — R269 Unspecified abnormalities of gait and mobility: Secondary | ICD-10-CM | POA: Insufficient documentation

## 2014-08-25 DIAGNOSIS — M4800 Spinal stenosis, site unspecified: Secondary | ICD-10-CM | POA: Diagnosis not present

## 2014-08-25 DIAGNOSIS — M7542 Impingement syndrome of left shoulder: Secondary | ICD-10-CM

## 2014-08-25 DIAGNOSIS — I610 Nontraumatic intracerebral hemorrhage in hemisphere, subcortical: Secondary | ICD-10-CM | POA: Diagnosis not present

## 2014-08-25 DIAGNOSIS — G8190 Hemiplegia, unspecified affecting unspecified side: Secondary | ICD-10-CM | POA: Diagnosis not present

## 2014-08-25 NOTE — Progress Notes (Addendum)
Shoulder injection Left Without ultrasound guidance)  Indication:Left post strokeShoulder pain not relieved by medication management and other conservative care.Pain is interfering with physical therapy and ADLs  Initially was scheduled for right sacroiliac injection however this is only a minor issue, his shoulder is bothering him a lot more at the current time  Informed consent was obtained after describing risks and benefits of the procedure with the patient, this includes bleeding, bruising, infection and medication side effects. The patient wishes to proceed and has given written consent. Patient was placed in a seated position. TheLeftshoulder was marked and prepped with betadine in the subacromial area. A 25-gauge 1-1/2 inch needle was inserted into the subacromial area. After negative draw back for blood, a solution containing 1 mL of 6 mg per ML betamethasone and 4 mL of 1% lidocaine was injected. A band aid was applied. The patient tolerated the procedure well. Post procedure instructions were given.

## 2014-08-25 NOTE — Patient Instructions (Addendum)
Joint Injection Care After Refer to this sheet in the next few days. These instructions provide you with information on caring for yourself after you have had a joint injection. Your caregiver also may give you more specific instructions. Your treatment has been planned according to current medical practices, but problems sometimes occur. Call your caregiver if you have any problems or questions after your procedure. After any type of joint injection, it is not uncommon to experience:  Soreness, swelling, or bruising around the injection site.  Mild numbness, tingling, or weakness around the injection site caused by the numbing medicine used before or with the injection. It also is possible to experience the following effects associated with the specific agent after injection:  Iodine-based contrast agents:  Allergic reaction (itching, hives, widespread redness, and swelling beyond the injection site).  Corticosteroids (These effects are rare.):  Allergic reaction.  Increased blood sugar levels (If you have diabetes and you notice that your blood sugar levels have increased, notify your caregiver).  Increased blood pressure levels.  Mood swings.  Hyaluronic acid in the use of viscosupplementation.  Temporary heat or redness.  Temporary rash and itching.  Increased fluid accumulation in the injected joint. These effects all should resolve within a day after your procedure.  HOME CARE INSTRUCTIONS  Limit yourself to light activity the day of your procedure. Avoid lifting heavy objects, bending, stooping, or twisting.  Take prescription or over-the-counter pain medication as directed by your caregiver.  You may apply ice to your injection site to reduce pain and swelling the day of your procedure. Ice may be applied 03-04 times:  Put ice in a plastic bag.  Place a towel between your skin and the bag.  Leave the ice on for no longer than 15-20 minutes each time. SEEK  IMMEDIATE MEDICAL CARE IF:   Pain and swelling get worse rather than better or extend beyond the injection site.  Numbness does not go away.  Blood or fluid continues to leak from the injection site.  You have chest pain.  You have swelling of your face or tongue.  You have trouble breathing or you become dizzy.  You develop a fever, chills, or severe tenderness at the injection site that last longer than 1 day. MAKE SURE YOU:  Understand these instructions.  Watch your condition.  Get help right away if you are not doing well or if you get worse. Document Released: 12/22/2010 Document Revised: 07/03/2011 Document Reviewed: 12/22/2010 [facility] Patient Information 2015 [place], [place]. This information is not intended to replace advice given to you by your health care provider. Make sure you discuss any questions you have with your health care provider.   Restart Eliquis today

## 2014-08-25 NOTE — Progress Notes (Signed)
  PROCEDURE RECORD [facility]   Name: BONIFACIO PRUDEN DOB:1951-02-26 MRN: [id number]  Date:08/25/2014  Physician: Alysia Penna, MD    Nurse/CMA: Mancel Parsons  Allergies: No Known Allergies  Consent Signed: Yes.    Is patient diabetic? No.  CBG today?   Pregnant: No. LMP: No LMP for male patient. (age 65-55)  Anticoagulants: no Anti-inflammatory: no Antibiotics: no  Procedure: right sacroiliac epidural steroid injection  Position: Prone Start Time:  End Time:   Fluoro Time:   RN/CMA Rolan Bucco Corian Handley    Time 1:50 pm     BP 116/30     Pulse 47     Respirations 14     O2 Sat 95     S/S 6 6    Pain Level 2/10      D/C home with brother, patient A & O X 3, D/C instructions reviewed, and sits independently.

## 2014-08-26 DIAGNOSIS — I610 Nontraumatic intracerebral hemorrhage in hemisphere, subcortical: Secondary | ICD-10-CM | POA: Diagnosis not present

## 2014-08-26 DIAGNOSIS — I10 Essential (primary) hypertension: Secondary | ICD-10-CM | POA: Diagnosis not present

## 2014-08-26 DIAGNOSIS — I638 Other cerebral infarction: Secondary | ICD-10-CM | POA: Diagnosis not present

## 2014-08-26 DIAGNOSIS — M4800 Spinal stenosis, site unspecified: Secondary | ICD-10-CM | POA: Diagnosis not present

## 2014-08-26 DIAGNOSIS — J449 Chronic obstructive pulmonary disease, unspecified: Secondary | ICD-10-CM | POA: Diagnosis not present

## 2014-08-26 DIAGNOSIS — I5042 Chronic combined systolic (congestive) and diastolic (congestive) heart failure: Secondary | ICD-10-CM | POA: Diagnosis not present

## 2014-08-26 DIAGNOSIS — I69354 Hemiplegia and hemiparesis following cerebral infarction affecting left non-dominant side: Secondary | ICD-10-CM | POA: Diagnosis not present

## 2014-08-26 DIAGNOSIS — E782 Mixed hyperlipidemia: Secondary | ICD-10-CM | POA: Diagnosis not present

## 2014-08-26 DIAGNOSIS — M6281 Muscle weakness (generalized): Secondary | ICD-10-CM | POA: Diagnosis not present

## 2014-08-26 DIAGNOSIS — I2510 Atherosclerotic heart disease of native coronary artery without angina pectoris: Secondary | ICD-10-CM | POA: Diagnosis not present

## 2014-08-28 ENCOUNTER — Telehealth (HOSPITAL_COMMUNITY): Payer: Self-pay | Admitting: Vascular Surgery

## 2014-08-28 DIAGNOSIS — J449 Chronic obstructive pulmonary disease, unspecified: Secondary | ICD-10-CM | POA: Diagnosis not present

## 2014-08-28 DIAGNOSIS — I5042 Chronic combined systolic (congestive) and diastolic (congestive) heart failure: Secondary | ICD-10-CM | POA: Diagnosis not present

## 2014-08-28 DIAGNOSIS — M4800 Spinal stenosis, site unspecified: Secondary | ICD-10-CM | POA: Diagnosis not present

## 2014-08-28 DIAGNOSIS — I69354 Hemiplegia and hemiparesis following cerebral infarction affecting left non-dominant side: Secondary | ICD-10-CM | POA: Diagnosis not present

## 2014-08-28 DIAGNOSIS — M6281 Muscle weakness (generalized): Secondary | ICD-10-CM | POA: Diagnosis not present

## 2014-08-28 DIAGNOSIS — I2510 Atherosclerotic heart disease of native coronary artery without angina pectoris: Secondary | ICD-10-CM | POA: Diagnosis not present

## 2014-08-28 NOTE — Telephone Encounter (Signed)
Nurse from [facility]  hr 41 , no signs and symptoms of anything going on  just a very low heart rate .Marland Kitchen Please advise

## 2014-08-30 ENCOUNTER — Other Ambulatory Visit: Payer: Self-pay | Admitting: Cardiology

## 2014-08-31 DIAGNOSIS — J449 Chronic obstructive pulmonary disease, unspecified: Secondary | ICD-10-CM | POA: Diagnosis not present

## 2014-08-31 DIAGNOSIS — I5042 Chronic combined systolic (congestive) and diastolic (congestive) heart failure: Secondary | ICD-10-CM | POA: Diagnosis not present

## 2014-08-31 DIAGNOSIS — M6281 Muscle weakness (generalized): Secondary | ICD-10-CM | POA: Diagnosis not present

## 2014-08-31 DIAGNOSIS — M4800 Spinal stenosis, site unspecified: Secondary | ICD-10-CM | POA: Diagnosis not present

## 2014-08-31 DIAGNOSIS — I69354 Hemiplegia and hemiparesis following cerebral infarction affecting left non-dominant side: Secondary | ICD-10-CM | POA: Diagnosis not present

## 2014-08-31 DIAGNOSIS — I2510 Atherosclerotic heart disease of native coronary artery without angina pectoris: Secondary | ICD-10-CM | POA: Diagnosis not present

## 2014-08-31 NOTE — Telephone Encounter (Signed)
Rx(s) sent to pharmacy electronically.  

## 2014-09-01 DIAGNOSIS — I69354 Hemiplegia and hemiparesis following cerebral infarction affecting left non-dominant side: Secondary | ICD-10-CM | POA: Diagnosis not present

## 2014-09-01 DIAGNOSIS — I5042 Chronic combined systolic (congestive) and diastolic (congestive) heart failure: Secondary | ICD-10-CM | POA: Diagnosis not present

## 2014-09-01 DIAGNOSIS — M6281 Muscle weakness (generalized): Secondary | ICD-10-CM | POA: Diagnosis not present

## 2014-09-01 DIAGNOSIS — J449 Chronic obstructive pulmonary disease, unspecified: Secondary | ICD-10-CM | POA: Diagnosis not present

## 2014-09-01 DIAGNOSIS — M4800 Spinal stenosis, site unspecified: Secondary | ICD-10-CM | POA: Diagnosis not present

## 2014-09-01 DIAGNOSIS — I2510 Atherosclerotic heart disease of native coronary artery without angina pectoris: Secondary | ICD-10-CM | POA: Diagnosis not present

## 2014-09-02 DIAGNOSIS — I69354 Hemiplegia and hemiparesis following cerebral infarction affecting left non-dominant side: Secondary | ICD-10-CM | POA: Diagnosis not present

## 2014-09-02 DIAGNOSIS — I2510 Atherosclerotic heart disease of native coronary artery without angina pectoris: Secondary | ICD-10-CM | POA: Diagnosis not present

## 2014-09-02 DIAGNOSIS — I5042 Chronic combined systolic (congestive) and diastolic (congestive) heart failure: Secondary | ICD-10-CM | POA: Diagnosis not present

## 2014-09-02 DIAGNOSIS — M6281 Muscle weakness (generalized): Secondary | ICD-10-CM | POA: Diagnosis not present

## 2014-09-02 DIAGNOSIS — M4800 Spinal stenosis, site unspecified: Secondary | ICD-10-CM | POA: Diagnosis not present

## 2014-09-02 DIAGNOSIS — J449 Chronic obstructive pulmonary disease, unspecified: Secondary | ICD-10-CM | POA: Diagnosis not present

## 2014-09-03 DIAGNOSIS — I5042 Chronic combined systolic (congestive) and diastolic (congestive) heart failure: Secondary | ICD-10-CM | POA: Diagnosis not present

## 2014-09-03 DIAGNOSIS — M6281 Muscle weakness (generalized): Secondary | ICD-10-CM | POA: Diagnosis not present

## 2014-09-03 DIAGNOSIS — J449 Chronic obstructive pulmonary disease, unspecified: Secondary | ICD-10-CM | POA: Diagnosis not present

## 2014-09-03 DIAGNOSIS — M4800 Spinal stenosis, site unspecified: Secondary | ICD-10-CM | POA: Diagnosis not present

## 2014-09-03 DIAGNOSIS — I2510 Atherosclerotic heart disease of native coronary artery without angina pectoris: Secondary | ICD-10-CM | POA: Diagnosis not present

## 2014-09-03 DIAGNOSIS — I69354 Hemiplegia and hemiparesis following cerebral infarction affecting left non-dominant side: Secondary | ICD-10-CM | POA: Diagnosis not present

## 2014-09-07 DIAGNOSIS — M6281 Muscle weakness (generalized): Secondary | ICD-10-CM | POA: Diagnosis not present

## 2014-09-07 DIAGNOSIS — M4800 Spinal stenosis, site unspecified: Secondary | ICD-10-CM | POA: Diagnosis not present

## 2014-09-07 DIAGNOSIS — I69354 Hemiplegia and hemiparesis following cerebral infarction affecting left non-dominant side: Secondary | ICD-10-CM | POA: Diagnosis not present

## 2014-09-07 DIAGNOSIS — J449 Chronic obstructive pulmonary disease, unspecified: Secondary | ICD-10-CM | POA: Diagnosis not present

## 2014-09-07 DIAGNOSIS — I5042 Chronic combined systolic (congestive) and diastolic (congestive) heart failure: Secondary | ICD-10-CM | POA: Diagnosis not present

## 2014-09-07 DIAGNOSIS — I2510 Atherosclerotic heart disease of native coronary artery without angina pectoris: Secondary | ICD-10-CM | POA: Diagnosis not present

## 2014-09-08 DIAGNOSIS — M6281 Muscle weakness (generalized): Secondary | ICD-10-CM | POA: Diagnosis not present

## 2014-09-08 DIAGNOSIS — I2510 Atherosclerotic heart disease of native coronary artery without angina pectoris: Secondary | ICD-10-CM | POA: Diagnosis not present

## 2014-09-08 DIAGNOSIS — J449 Chronic obstructive pulmonary disease, unspecified: Secondary | ICD-10-CM | POA: Diagnosis not present

## 2014-09-08 DIAGNOSIS — I69354 Hemiplegia and hemiparesis following cerebral infarction affecting left non-dominant side: Secondary | ICD-10-CM | POA: Diagnosis not present

## 2014-09-08 DIAGNOSIS — I5042 Chronic combined systolic (congestive) and diastolic (congestive) heart failure: Secondary | ICD-10-CM | POA: Diagnosis not present

## 2014-09-08 DIAGNOSIS — M4800 Spinal stenosis, site unspecified: Secondary | ICD-10-CM | POA: Diagnosis not present

## 2014-09-09 DIAGNOSIS — I5042 Chronic combined systolic (congestive) and diastolic (congestive) heart failure: Secondary | ICD-10-CM | POA: Diagnosis not present

## 2014-09-09 DIAGNOSIS — I69354 Hemiplegia and hemiparesis following cerebral infarction affecting left non-dominant side: Secondary | ICD-10-CM | POA: Diagnosis not present

## 2014-09-09 DIAGNOSIS — M6281 Muscle weakness (generalized): Secondary | ICD-10-CM | POA: Diagnosis not present

## 2014-09-09 DIAGNOSIS — M4800 Spinal stenosis, site unspecified: Secondary | ICD-10-CM | POA: Diagnosis not present

## 2014-09-09 DIAGNOSIS — J449 Chronic obstructive pulmonary disease, unspecified: Secondary | ICD-10-CM | POA: Diagnosis not present

## 2014-09-09 DIAGNOSIS — I2510 Atherosclerotic heart disease of native coronary artery without angina pectoris: Secondary | ICD-10-CM | POA: Diagnosis not present

## 2014-09-10 DIAGNOSIS — I69354 Hemiplegia and hemiparesis following cerebral infarction affecting left non-dominant side: Secondary | ICD-10-CM | POA: Diagnosis not present

## 2014-09-10 DIAGNOSIS — M6281 Muscle weakness (generalized): Secondary | ICD-10-CM | POA: Diagnosis not present

## 2014-09-10 DIAGNOSIS — M4800 Spinal stenosis, site unspecified: Secondary | ICD-10-CM | POA: Diagnosis not present

## 2014-09-10 DIAGNOSIS — J449 Chronic obstructive pulmonary disease, unspecified: Secondary | ICD-10-CM | POA: Diagnosis not present

## 2014-09-10 DIAGNOSIS — I5042 Chronic combined systolic (congestive) and diastolic (congestive) heart failure: Secondary | ICD-10-CM | POA: Diagnosis not present

## 2014-09-10 DIAGNOSIS — I2510 Atherosclerotic heart disease of native coronary artery without angina pectoris: Secondary | ICD-10-CM | POA: Diagnosis not present

## 2014-09-14 ENCOUNTER — Ambulatory Visit (HOSPITAL_COMMUNITY)
Admission: RE | Admit: 2014-09-14 | Discharge: 2014-09-14 | Disposition: A | Discharge status: Home / Self Care | Payer: Medicare Other | Source: Ambulatory Visit | Attending: Internal Medicine | Admitting: Internal Medicine

## 2014-09-14 DIAGNOSIS — J449 Chronic obstructive pulmonary disease, unspecified: Secondary | ICD-10-CM | POA: Diagnosis not present

## 2014-09-14 DIAGNOSIS — I69354 Hemiplegia and hemiparesis following cerebral infarction affecting left non-dominant side: Secondary | ICD-10-CM | POA: Diagnosis not present

## 2014-09-14 DIAGNOSIS — I6523 Occlusion and stenosis of bilateral carotid arteries: Secondary | ICD-10-CM | POA: Diagnosis not present

## 2014-09-14 DIAGNOSIS — M6281 Muscle weakness (generalized): Secondary | ICD-10-CM | POA: Diagnosis not present

## 2014-09-14 DIAGNOSIS — M4800 Spinal stenosis, site unspecified: Secondary | ICD-10-CM | POA: Diagnosis not present

## 2014-09-14 DIAGNOSIS — Z8673 Personal history of transient ischemic attack (TIA), and cerebral infarction without residual deficits: Secondary | ICD-10-CM | POA: Diagnosis not present

## 2014-09-14 DIAGNOSIS — I739 Peripheral vascular disease, unspecified: Secondary | ICD-10-CM

## 2014-09-14 DIAGNOSIS — I2510 Atherosclerotic heart disease of native coronary artery without angina pectoris: Secondary | ICD-10-CM | POA: Diagnosis not present

## 2014-09-14 DIAGNOSIS — I5042 Chronic combined systolic (congestive) and diastolic (congestive) heart failure: Secondary | ICD-10-CM | POA: Diagnosis not present

## 2014-09-16 ENCOUNTER — Ambulatory Visit (HOSPITAL_COMMUNITY)
Admission: RE | Admit: 2014-09-16 | Discharge: 2014-09-16 | Disposition: A | Discharge status: Home / Self Care | Payer: Medicare Other | Source: Ambulatory Visit | Attending: Internal Medicine | Admitting: Internal Medicine

## 2014-09-16 VITALS — BP 120/72 | HR 50 | Wt 175.0 lb

## 2014-09-16 DIAGNOSIS — I5042 Chronic combined systolic (congestive) and diastolic (congestive) heart failure: Secondary | ICD-10-CM

## 2014-09-16 DIAGNOSIS — Z7901 Long term (current) use of anticoagulants: Secondary | ICD-10-CM | POA: Diagnosis not present

## 2014-09-16 DIAGNOSIS — I482 Chronic atrial fibrillation, unspecified: Secondary | ICD-10-CM

## 2014-09-16 DIAGNOSIS — I5020 Unspecified systolic (congestive) heart failure: Secondary | ICD-10-CM

## 2014-09-16 LAB — CBC
HCT: 32.6 % — ABNORMAL LOW (ref 39.0–52.0)
Hemoglobin: 10.2 g/dL — ABNORMAL LOW (ref 13.0–17.0)
MCH: 29.6 pg (ref 26.0–34.0)
MCHC: 31.3 g/dL (ref 30.0–36.0)
MCV: 94.5 fL (ref 78.0–100.0)
Platelets: 116 10*3/uL — ABNORMAL LOW (ref 150–400)
RBC: 3.45 MIL/uL — AB (ref 4.22–5.81)
RDW: 15.2 % (ref 11.5–15.5)
WBC: 3.9 10*3/uL — ABNORMAL LOW (ref 4.0–10.5)

## 2014-09-16 LAB — BASIC METABOLIC PANEL
ANION GAP: 7 (ref 5–15)
BUN: 29 mg/dL — AB (ref 6–20)
CO2: 27 mmol/L (ref 22–32)
Calcium: 8.6 mg/dL — ABNORMAL LOW (ref 8.9–10.3)
Chloride: 105 mmol/L (ref 101–111)
Creatinine, Ser: 2.17 mg/dL — ABNORMAL HIGH (ref 0.61–1.24)
GFR calc Af Amer: 35 mL/min — ABNORMAL LOW (ref >60)
GFR calc non Af Amer: 30 mL/min — ABNORMAL LOW (ref >60)
Glucose, Bld: 78 mg/dL (ref 65–99)
Potassium: 4.7 mmol/L (ref 3.5–5.1)
SODIUM: 139 mmol/L (ref 135–145)

## 2014-09-16 LAB — DIGOXIN LEVEL: Digoxin Level: 1.7 ng/mL (ref 0.8–2.0)

## 2014-09-16 MED ORDER — TORSEMIDE 20 MG PO TABS: 20.0000 mg | ORAL_TABLET | Freq: Every day | ORAL | Status: DC

## 2014-09-16 NOTE — Progress Notes (Signed)
Patient ID: [name], male   DOB: 08-27-1950, 64 y.o.   MRN: [id number] EP: Dr Lovena Le   HPI: [name] is 64 y.o. male with past medical history of severe aortic stenosis s/p tissue AV replacement (09/2012) atrial fibrillation s/p MAZE (09/2012)  chronic combined systolic/diastolic HF, RV failure, HTN, PAD s/p L iliac stent in 2010 and aortobifemoral bypass graft, ETOH abuse, severe COPD with cor pulmonale on home oxygen, CAD s/p CABG with AV replacement and back pain from spinal stenosis.   Admitted and treated for acute failure complicated by listeria bacteremia and Afib RVR 09/2013. Diuresed with IV lasix transitioned to po lasix.He had TEE DC-CV but went back into afib with controlled rate on amiodarone and eliquis. He was treated with 2 weeks of antibiotics. Discharge weight was 226 pounds.  Amiodarone was stopped as patient did not hold NSR.    Patient was admitted to [facility] in 12/15 with right basal ganglia intracerebral hemorrhage and left hemiparesis.  He had been on Eliquis.  He eventually was sent to inpatient rehab, where due to a combination of ongoing diuretic use and poor po intake, he developed AKI and hyponatremia.  Diuretics and ACEI were held, and creatinine returned to baseline.    He returns for follow up. Last visit he was started on eliquis. Afternoon torsemide was cut back. Weight at home trending down 171-176 pounds. Was 205 pounds last visit.  Appetite poor. Continues to work with PT.  Denies SOB/PND/Orthopnea. No bleeding problems. Taking all medicattions.   ECHO 30-35% 08/2012  ECHO 08/2013 EF 50-55% Grade II DD  TEE 6/15 EF 40-45%, bioprosthetic aortic valve ok  PFTs 9/14 FEV1 1.1L (34%) FVC 2.57L (57%) FEF 25-75% (17%) DLCO 41%  Labs (9/15): LDL 106 Labs (05/05/14): K 3.4, creatinine 1.34, digoxin 0.4 Labs 06/10/2014: K 5.6 Creatinine 2.7 BNP 215 Labs 06/19/2014: K 4.9 Creatinine 2.28 K and spiro stopped.  Labs 07/09/2014: K 4.9 Creatinine 2.47  SH: Former Curator. Quit smoking 1 year ago. Drink alcohol occasionally . Denies drug use. Lives alone   FH: Father died MI at 15 Brother MI   ROS: All systems negative except as listed in HPI, PMH and Problem List.  Past Medical History  Diagnosis Date  . Hypertension   . Gout   . Aortic stenosis     echo 06/19/08 with nomal LV function, moderate concentric LVH moderate aortic stenosis area 0.99 cm squared, peak gradient of 50 and mean of 31  . Hyperlipidemia   . Atrial fibrillation with RVR, was in atrial fib in 04/2011 09/16/2012    a) s/p MAZE (09/2012)   . Tobacco use 09/16/2012  . Heart murmur   . Chronic combined systolic and diastolic CHF (congestive heart failure) 09/16/2012    a) ECHO (08/2013) EF 50-55%, grade II DD b) TEE ECHO (09/2013): 40-45%, bioprosthesis present, mild Dustin  . Aortic stenosis, severe 09/23/2012  . CAD (coronary artery disease), single vessel disease 09/23/2012    a) CABG (SVG to PDA and PL) wtih AVR (09/2012)   . PAD (peripheral artery disease), decreased bil. ABIs 09/23/2012    a) ABIs (11/2013): Right mild arterial insufficiency, L normal; aroto-bifem bypass graft: not well visualized, bilateral SFAs = to greater than 50% in dimaeter reduction   . Gout flare. 09/29/12, Lt knee, improved with colchicine. 09/30/2012  . H/O aortic valve replacement   . AS (aortic stenosis) with AVR with pericardial tissue valve  09/30/2013  . Back pain, spinal stenosis 09/30/2013  .  COPD (chronic obstructive pulmonary disease)   . Bilateral carotid artery disease     Current Outpatient Prescriptions  Medication Sig Dispense Refill  . albuterol (PROVENTIL HFA;VENTOLIN HFA) 108 (90 BASE) MCG/ACT inhaler Inhale 2 puffs into the lungs every 6 (six) hours as needed for wheezing or shortness of breath. 1 Inhaler 2  . amiodarone (PACERONE) 200 MG tablet Take 1 tablet (200 mg total) by mouth daily. 30 tablet 6  . apixaban (ELIQUIS) 5 MG TABS tablet Take 1 tablet (5 mg total) by mouth 2 (two) times daily. 60 tablet  6  . atorvastatin (LIPITOR) 80 MG tablet Take 1 tablet (80 mg total) by mouth daily at 6 PM. 30 tablet 6  . budesonide-formoterol (SYMBICORT) 80-4.5 MCG/ACT inhaler Inhale 2 puffs into the lungs 2 (two) times daily. 1 Inhaler 12  . carvedilol (COREG) 25 MG tablet Take 1 tablet (25 mg total) by mouth 2 (two) times daily with a meal. 60 tablet 5  . diclofenac sodium (VOLTAREN) 1 % GEL Apply 2 g topically 4 (four) times daily. To right knee    . digoxin (LANOXIN) 0.125 MG tablet Take 1 tablet (0.125 mg total) by mouth every other day.    . folic acid (FOLVITE) 1 MG tablet Take 1 tablet (1 mg total) by mouth daily. 30 tablet 6  . lisinopril (PRINIVIL,ZESTRIL) 2.5 MG tablet Take 1 tablet (2.5 mg total) by mouth daily. 30 tablet 3  . OXYGEN-HELIUM IN Inhale 1-2 L into the lungs continuous. Is in the habit of not using his oxygen when he leaves the house. Uses 1L continuous normally Uses 2L for exertion    . pantoprazole (PROTONIX) 40 MG tablet Take 1 tablet (40 mg total) by mouth daily.    Marland Kitchen PRESCRIPTION MEDICATION Apply 1 application topically daily.    Marland Kitchen tiZANidine (ZANAFLEX) 4 MG tablet Take 4 mg by mouth 2 (two) times daily.  1  . torsemide (DEMADEX) 20 MG tablet Take 1 tablet (20 mg total) by mouth daily. 60 tablet 3  . traMADol (ULTRAM) 50 MG tablet Take 1 tablet (50 mg total) by mouth every 6 (six) hours as needed for moderate pain. 120 tablet 2  . mirtazapine (REMERON) 7.5 MG tablet Take 1 tablet (7.5 mg total) by mouth at bedtime. (Patient not taking: Reported on 09/16/2014)    . polyethylene glycol (MIRALAX / GLYCOLAX) packet Take 17 g by mouth every other day. (Patient not taking: Reported on 09/16/2014) 14 each 0  . senna-docusate (SENOKOT-S) 8.6-50 MG per tablet Take 2 tablets by mouth at bedtime. (Patient not taking: Reported on 09/16/2014)    . spironolactone (ALDACTONE) 25 MG tablet Take 25 mg by mouth daily. Take 1/2 tablet daily     No current facility-administered medications for this  encounter.   Filed Vitals:   09/16/14 1407  BP: 120/72  Pulse: 50  Weight: 175 lb (79.379 kg)  SpO2: 95%    100/60 Sitting 100/62 Standing.  PHYSICAL EXAM: General:  Sitting in the wheel chair. No resp difficulty. Brother present HEENT: normal Neck: supple. JVP flat  Carotids 2+ bilaterally; no bruits. No lymphadenopathy or thryomegaly appreciated. Cor: PMI normal. RRR, 2/6 SEM RUSB.  Lungs: barrel chested. diminshed throughout fine crackles in bases Abdomen: soft, nontender, nondistended. No hepatosplenomegaly. No bruits or masses. Good bowel sounds. Extremities: no cyanosis, clubbing, rash. LLE 1+ RLE tr-1+ edema.   Neuro: alert & orientedx3, cranial nerves grossly intact. Left-sided weakness.   ASSESSMENT & PLAN:  1. Biventricular HF (R>L):  Suspect mixed ischemic/nonischemic cardiomyopathy,  EF 40-45% on 6/15 TEE.  He has had cor pulmonale with prominent RV failure from COPD. NYHA class II-III symptoms. . Still has left-sided weakness.   BP soft. Volume status low. Weight down 20 pounds since last visit. Hold torsemide for now. Instructed to only take  torsemide 20 mg for weight 185 pounds or greater.    - Continue digoxin. Check dig level, cbc, and bmet now  2. Severe COPD with cor pulmonale: Using continuous oxygen. Follows with Dr [place] Bells.  3 Chronic A fib: s/p MAZE. Failed DC-CV x2 in November 2014.  Failed DC-CV (09/2013).  However, he was in NSR in 12/15 at time of hospital admission.  Continue amiodarone 200 mg daily.  He will need yearly eye exam and baseline PFTs.  -In the past he had difficulty with coumadin and regulating INR. Ok to restart eliquis per neurology. -Continue  eliquis 5 mg twice a day . No bleeding problems.  4. CAD: s/p CABG bioprosthetic AVR.  Stop aspirin. He had difficulty with coumadin He is on ASA 81 now that he is off anticoagulation.  Continue atorvastatin 80 mg daily.    5. Intracerebral hemorrhage: Still has left-sided weakness.  He is off Eliquis.   Discussed anticoagulant options. In the past he had difficulty with coumadin and regulating INR. Continue eliquis per neuro.   6. CKD: Check BMET now.       Follow up in 8  weeks.    CLEGG,AMY NP-C  09/16/2014

## 2014-09-16 NOTE — Patient Instructions (Signed)
CHANGE Torsemide to as needed for weight 185 or greater  Labs today  Your physician recommends that you schedule a follow-up appointment in: 2 months  Do the following things EVERYDAY: 1) Weigh yourself in the morning before breakfast. Write it down and keep it in a log. 2) Take your medicines as prescribed 3) Eat low salt foods-Limit salt (sodium) to 2000 mg per day.  4) Stay as active as you can everyday 5) Limit all fluids for the day to less than 2 liters 6)

## 2014-09-17 ENCOUNTER — Telehealth (HOSPITAL_COMMUNITY): Payer: Self-pay

## 2014-09-17 ENCOUNTER — Telehealth (HOSPITAL_COMMUNITY): Payer: Self-pay | Admitting: *Deleted

## 2014-09-17 ENCOUNTER — Other Ambulatory Visit (HOSPITAL_COMMUNITY): Payer: Self-pay

## 2014-09-17 DIAGNOSIS — I5042 Chronic combined systolic (congestive) and diastolic (congestive) heart failure: Secondary | ICD-10-CM | POA: Diagnosis not present

## 2014-09-17 DIAGNOSIS — M6281 Muscle weakness (generalized): Secondary | ICD-10-CM | POA: Diagnosis not present

## 2014-09-17 DIAGNOSIS — I2510 Atherosclerotic heart disease of native coronary artery without angina pectoris: Secondary | ICD-10-CM | POA: Diagnosis not present

## 2014-09-17 DIAGNOSIS — M4800 Spinal stenosis, site unspecified: Secondary | ICD-10-CM | POA: Diagnosis not present

## 2014-09-17 DIAGNOSIS — I69354 Hemiplegia and hemiparesis following cerebral infarction affecting left non-dominant side: Secondary | ICD-10-CM | POA: Diagnosis not present

## 2014-09-17 DIAGNOSIS — J449 Chronic obstructive pulmonary disease, unspecified: Secondary | ICD-10-CM | POA: Diagnosis not present

## 2014-09-17 MED ORDER — PANTOPRAZOLE SODIUM 40 MG PO TBEC
40.0000 mg | DELAYED_RELEASE_TABLET | Freq: Every day | ORAL | Status: DC | PRN
Start: 2014-09-17 — End: 2015-01-11

## 2014-09-17 MED ORDER — SENNOSIDES-DOCUSATE SODIUM 8.6-50 MG PO TABS: 2.0000 | ORAL_TABLET | Freq: Every day | ORAL | Status: DC | PRN

## 2014-09-17 MED ORDER — MIRTAZAPINE 7.5 MG PO TABS: 7.5000 mg | ORAL_TABLET | Freq: Every evening | ORAL | Status: DC | PRN

## 2014-09-17 NOTE — Telephone Encounter (Signed)
Pt aware.

## 2014-09-17 NOTE — Telephone Encounter (Signed)
Patient called to clarify meds he is taking as he had an apt in the HF clinic yesterday but forgot his med list.  He confirms that he is not taking sennakot, remeron, or protonix, but still has them if he needs them.  He did tell the provider yesterday he did not take these at all, but would like to have them in case needed.  Advised these do not need to be taken every day, and can be changed to PRN.  Rx's updated in system and to pharmacy.  Patient aware and agreeable.  Renee Pain

## 2014-09-17 NOTE — Telephone Encounter (Signed)
-----   Message from Conrad Mettler, NP sent at 09/17/2014  2:53 PM EDT ----- Please call him and instruct him to stop digoxin.

## 2014-09-18 DIAGNOSIS — I69354 Hemiplegia and hemiparesis following cerebral infarction affecting left non-dominant side: Secondary | ICD-10-CM | POA: Diagnosis not present

## 2014-09-18 DIAGNOSIS — J449 Chronic obstructive pulmonary disease, unspecified: Secondary | ICD-10-CM | POA: Diagnosis not present

## 2014-09-18 DIAGNOSIS — M4800 Spinal stenosis, site unspecified: Secondary | ICD-10-CM | POA: Diagnosis not present

## 2014-09-18 DIAGNOSIS — I5042 Chronic combined systolic (congestive) and diastolic (congestive) heart failure: Secondary | ICD-10-CM | POA: Diagnosis not present

## 2014-09-18 DIAGNOSIS — M6281 Muscle weakness (generalized): Secondary | ICD-10-CM | POA: Diagnosis not present

## 2014-09-18 DIAGNOSIS — I2510 Atherosclerotic heart disease of native coronary artery without angina pectoris: Secondary | ICD-10-CM | POA: Diagnosis not present

## 2014-09-22 DIAGNOSIS — M4800 Spinal stenosis, site unspecified: Secondary | ICD-10-CM | POA: Diagnosis not present

## 2014-09-22 DIAGNOSIS — I69354 Hemiplegia and hemiparesis following cerebral infarction affecting left non-dominant side: Secondary | ICD-10-CM | POA: Diagnosis not present

## 2014-09-22 DIAGNOSIS — I2510 Atherosclerotic heart disease of native coronary artery without angina pectoris: Secondary | ICD-10-CM | POA: Diagnosis not present

## 2014-09-22 DIAGNOSIS — I5042 Chronic combined systolic (congestive) and diastolic (congestive) heart failure: Secondary | ICD-10-CM | POA: Diagnosis not present

## 2014-09-22 DIAGNOSIS — J449 Chronic obstructive pulmonary disease, unspecified: Secondary | ICD-10-CM | POA: Diagnosis not present

## 2014-09-22 DIAGNOSIS — M6281 Muscle weakness (generalized): Secondary | ICD-10-CM | POA: Diagnosis not present

## 2014-09-23 DIAGNOSIS — I69354 Hemiplegia and hemiparesis following cerebral infarction affecting left non-dominant side: Secondary | ICD-10-CM | POA: Diagnosis not present

## 2014-09-23 DIAGNOSIS — M6281 Muscle weakness (generalized): Secondary | ICD-10-CM | POA: Diagnosis not present

## 2014-09-23 DIAGNOSIS — I2510 Atherosclerotic heart disease of native coronary artery without angina pectoris: Secondary | ICD-10-CM | POA: Diagnosis not present

## 2014-09-23 DIAGNOSIS — I5042 Chronic combined systolic (congestive) and diastolic (congestive) heart failure: Secondary | ICD-10-CM | POA: Diagnosis not present

## 2014-09-23 DIAGNOSIS — M4800 Spinal stenosis, site unspecified: Secondary | ICD-10-CM | POA: Diagnosis not present

## 2014-09-23 DIAGNOSIS — J449 Chronic obstructive pulmonary disease, unspecified: Secondary | ICD-10-CM | POA: Diagnosis not present

## 2014-09-24 DIAGNOSIS — I5042 Chronic combined systolic (congestive) and diastolic (congestive) heart failure: Secondary | ICD-10-CM | POA: Diagnosis not present

## 2014-09-24 DIAGNOSIS — I69354 Hemiplegia and hemiparesis following cerebral infarction affecting left non-dominant side: Secondary | ICD-10-CM | POA: Diagnosis not present

## 2014-09-24 DIAGNOSIS — M6281 Muscle weakness (generalized): Secondary | ICD-10-CM | POA: Diagnosis not present

## 2014-09-24 DIAGNOSIS — J449 Chronic obstructive pulmonary disease, unspecified: Secondary | ICD-10-CM | POA: Diagnosis not present

## 2014-09-24 DIAGNOSIS — I2510 Atherosclerotic heart disease of native coronary artery without angina pectoris: Secondary | ICD-10-CM | POA: Diagnosis not present

## 2014-09-24 DIAGNOSIS — M4800 Spinal stenosis, site unspecified: Secondary | ICD-10-CM | POA: Diagnosis not present

## 2014-09-25 DIAGNOSIS — M4800 Spinal stenosis, site unspecified: Secondary | ICD-10-CM | POA: Diagnosis not present

## 2014-09-25 DIAGNOSIS — J449 Chronic obstructive pulmonary disease, unspecified: Secondary | ICD-10-CM | POA: Diagnosis not present

## 2014-09-25 DIAGNOSIS — M6281 Muscle weakness (generalized): Secondary | ICD-10-CM | POA: Diagnosis not present

## 2014-09-25 DIAGNOSIS — I2510 Atherosclerotic heart disease of native coronary artery without angina pectoris: Secondary | ICD-10-CM | POA: Diagnosis not present

## 2014-09-25 DIAGNOSIS — I69354 Hemiplegia and hemiparesis following cerebral infarction affecting left non-dominant side: Secondary | ICD-10-CM | POA: Diagnosis not present

## 2014-09-25 DIAGNOSIS — I5042 Chronic combined systolic (congestive) and diastolic (congestive) heart failure: Secondary | ICD-10-CM | POA: Diagnosis not present

## 2014-09-28 ENCOUNTER — Ambulatory Visit: Payer: Self-pay

## 2014-09-28 ENCOUNTER — Ambulatory Visit: Payer: Self-pay | Admitting: Physical Medicine & Rehabilitation

## 2014-09-28 DIAGNOSIS — H25813 Combined forms of age-related cataract, bilateral: Secondary | ICD-10-CM | POA: Diagnosis not present

## 2014-09-29 DIAGNOSIS — I69354 Hemiplegia and hemiparesis following cerebral infarction affecting left non-dominant side: Secondary | ICD-10-CM | POA: Diagnosis not present

## 2014-09-29 DIAGNOSIS — I2510 Atherosclerotic heart disease of native coronary artery without angina pectoris: Secondary | ICD-10-CM | POA: Diagnosis not present

## 2014-09-29 DIAGNOSIS — M4800 Spinal stenosis, site unspecified: Secondary | ICD-10-CM | POA: Diagnosis not present

## 2014-09-29 DIAGNOSIS — J449 Chronic obstructive pulmonary disease, unspecified: Secondary | ICD-10-CM | POA: Diagnosis not present

## 2014-09-29 DIAGNOSIS — I5042 Chronic combined systolic (congestive) and diastolic (congestive) heart failure: Secondary | ICD-10-CM | POA: Diagnosis not present

## 2014-09-29 DIAGNOSIS — M6281 Muscle weakness (generalized): Secondary | ICD-10-CM | POA: Diagnosis not present

## 2014-09-30 DIAGNOSIS — I69354 Hemiplegia and hemiparesis following cerebral infarction affecting left non-dominant side: Secondary | ICD-10-CM | POA: Diagnosis not present

## 2014-09-30 DIAGNOSIS — I5042 Chronic combined systolic (congestive) and diastolic (congestive) heart failure: Secondary | ICD-10-CM | POA: Diagnosis not present

## 2014-09-30 DIAGNOSIS — J449 Chronic obstructive pulmonary disease, unspecified: Secondary | ICD-10-CM | POA: Diagnosis not present

## 2014-09-30 DIAGNOSIS — M4800 Spinal stenosis, site unspecified: Secondary | ICD-10-CM | POA: Diagnosis not present

## 2014-09-30 DIAGNOSIS — M6281 Muscle weakness (generalized): Secondary | ICD-10-CM | POA: Diagnosis not present

## 2014-09-30 DIAGNOSIS — I2510 Atherosclerotic heart disease of native coronary artery without angina pectoris: Secondary | ICD-10-CM | POA: Diagnosis not present

## 2014-10-01 ENCOUNTER — Encounter: Payer: Medicare Other | Attending: Physical Medicine & Rehabilitation

## 2014-10-01 ENCOUNTER — Ambulatory Visit (HOSPITAL_BASED_OUTPATIENT_CLINIC_OR_DEPARTMENT_OTHER): Payer: Medicare Other | Admitting: Physical Medicine & Rehabilitation

## 2014-10-01 ENCOUNTER — Encounter: Payer: Self-pay | Admitting: Physical Medicine & Rehabilitation

## 2014-10-01 VITALS — BP 101/42 | HR 50 | Resp 14

## 2014-10-01 DIAGNOSIS — I69398 Other sequelae of cerebral infarction: Secondary | ICD-10-CM | POA: Diagnosis not present

## 2014-10-01 DIAGNOSIS — I610 Nontraumatic intracerebral hemorrhage in hemisphere, subcortical: Secondary | ICD-10-CM | POA: Insufficient documentation

## 2014-10-01 DIAGNOSIS — M7542 Impingement syndrome of left shoulder: Secondary | ICD-10-CM

## 2014-10-01 DIAGNOSIS — R208 Other disturbances of skin sensation: Secondary | ICD-10-CM | POA: Diagnosis not present

## 2014-10-01 DIAGNOSIS — M7582 Other shoulder lesions, left shoulder: Secondary | ICD-10-CM | POA: Diagnosis not present

## 2014-10-01 DIAGNOSIS — G8190 Hemiplegia, unspecified affecting unspecified side: Secondary | ICD-10-CM | POA: Insufficient documentation

## 2014-10-01 DIAGNOSIS — R269 Unspecified abnormalities of gait and mobility: Secondary | ICD-10-CM | POA: Insufficient documentation

## 2014-10-01 DIAGNOSIS — M7540 Impingement syndrome of unspecified shoulder: Secondary | ICD-10-CM | POA: Insufficient documentation

## 2014-10-01 DIAGNOSIS — G8194 Hemiplegia, unspecified affecting left nondominant side: Secondary | ICD-10-CM

## 2014-10-01 NOTE — Progress Notes (Deleted)
Subjective:    Patient ID: [name], male    DOB: 08/04/1950, 64 y.o.   MRN: [id number]  HPI   Pain Inventory Average Pain 3 Pain Right Now 0 My pain is intermittent  In the last 24 hours, has pain interfered with the following? General activity 3 Relation with others 0 Enjoyment of life 0 What TIME of day is your pain at its worst? varies Sleep (in general) Good  Pain is worse with: walking and some activites Pain improves with: medication and injections Relief from Meds: 8  Mobility walk with assistance use a walker how many minutes can you walk? 5 ability to climb steps?  no use a wheelchair transfers alone  Function retired  Neuro/Psych No problems in this area  Prior Studies Any changes since last visit?  no  Physicians involved in your care Any changes since last visit?  no   Family History  Problem Relation Age of Onset  . Heart attack Father     died at 72 with MI  . Heart disease Brother   . Heart attack Brother   . Heart attack Brother    History   Social History  . Marital Status: Divorced    Spouse Name: N/A  . Number of Children: N/A  . Years of Education: N/A   Occupational History  . retired    Social History Main Topics  . Smoking status: Former Smoker -- 1.00 packs/day for 43 years    Types: Cigarettes    Quit date: 09/12/2012  . Smokeless tobacco: Never Used     Comment: More than 43 + pack years as he smoked up to 2 1/2 ppd for many years. Had a period of cessation after femoral stent.  . Alcohol Use: No     Comment: 6-12 cans of beer a day, none since hospital stay  . Drug Use: No  . Sexual Activity: Not on file   Other Topics Concern  . None   Social History Narrative   Past Surgical History  Procedure Laterality Date  . Iliac artery stent  10/20/08    stent to lt iliac  . Aorto bifem bypass  12/18/08    by Dr. Trula Slade  . Coronary artery bypass graft N/A 10/08/2012    Procedure: CORONARY ARTERY BYPASS  GRAFTING (CABG);  Surgeon: Grace Isaac, MD;  Location: [facility];  Service: Open Heart Surgery;  Laterality: N/A;  Coronary Artery bypass Graft times two utilizing the left greater saphenous vein harvested endoscopically  . Aortic valve replacement N/A 10/08/2012    Procedure: AORTIC VALVE REPLACEMENT (AVR);  Surgeon: Grace Isaac, MD;  Location: [place];  Service: Open Heart Surgery;  Laterality: N/A;  . Maze N/A 10/08/2012    Procedure: MAZE;  Surgeon: Grace Isaac, MD;  Location: [place];  Service: Open Heart Surgery;  Laterality: N/A;  . Intraoperative transesophageal echocardiogram N/A 10/08/2012    Procedure: INTRAOPERATIVE TRANSESOPHAGEAL ECHOCARDIOGRAM;  Surgeon: Grace Isaac, MD;  Location: [facility];  Service: Open Heart Surgery;  Laterality: N/A;  . Endovein harvest of greater saphenous vein Bilateral 10/08/2012    Procedure: ENDOVEIN HARVEST OF GREATER SAPHENOUS VEIN;  Surgeon: Grace Isaac, MD;  Location: [place];  Service: Open Heart Surgery;  Laterality: Bilateral;  . US echocardiography  06/20/2011    mod concentric LVH,LA severely dilated,RA mildly dilated,mod. ca+ of the mitral apparatus,trace MR,mod. ca+ AOV w/stenosis.  Marland Kitchen Nm myocar perf wall motion  08/25/2008  normal  . Cardioversion N/A 02/25/2013    Procedure: CARDIOVERSION;  Surgeon: Sanda Klein, MD;  Location: [facility]OS[facility];  Service: Cardiovascular;  Laterality: N/A;  . Tee without cardioversion N/A 09/25/2013    Procedure: TRANSESOPHAGEAL ECHOCARDIOGRAM (TEE);  Surgeon: Thayer Headings, MD;  Location: [place];  Service: Cardiovascular;  Laterality: N/A;  . Cardioversion N/A 09/25/2013    Procedure: CARDIOVERSION;  Surgeon: Thayer Headings, MD;  Location[facility] ENDOSCOPY;  Service: Cardiovascular;  Laterality: N/A;  . Left and right heart catheterization with coronary angiogram N/A 09/20/2012    Procedure: LEFT AND RIGHT HEART CATHETERIZATION WITH CORONARY ANGIOGRAM;  Surgeon: Lorretta Harp, MD;  Location[facility]  CATH LAB;  Service: Cardiovascular;  Laterality: N/A;   Past Medical History  Diagnosis Date  . Hypertension   . Gout   . Aortic stenosis     echo 06/19/08 with nomal LV function, moderate concentric LVH moderate aortic stenosis area 0.99 cm squared, peak gradient of 50 and mean of 31  . Hyperlipidemia   . Atrial fibrillation with RVR, was in atrial fib in 04/2011 09/16/2012    a) s/p MAZE (09/2012)   . Tobacco use 09/16/2012  . Heart murmur   . Chronic combined systolic and diastolic CHF (congestive heart failure) 09/16/2012    a) ECHO (08/2013) EF 50-55%, grade II DD b) TEE ECHO (09/2013): 40-45%, bioprosthesis present, mild MR  . Aortic stenosis, severe 09/23/2012  . CAD (coronary artery disease), single vessel disease 09/23/2012    a) CABG (SVG to PDA and PL) wtih AVR (09/2012)   . PAD (peripheral artery disease), decreased bil. ABIs 09/23/2012    a) ABIs (11/2013): Right mild arterial insufficiency, L normal; aroto-bifem bypass graft: not well visualized, bilateral SFAs = to greater than 50% in dimaeter reduction   . Gout flare. 09/29/12, Lt knee, improved with colchicine. 09/30/2012  . H/O aortic valve replacement   . AS (aortic stenosis) with AVR with pericardial tissue valve  09/30/2013  . Back pain, spinal stenosis 09/30/2013  . COPD (chronic obstructive pulmonary disease)   . Bilateral carotid artery disease    BP 101/42 mmHg  Pulse 50  Resp 14  SpO2 95%  Opioid Risk Score:   Fall Risk Score: Moderate Fall Risk (6-13 points)`1  Depression screen PHQ 2/9  Depression screen PHQ 2/9 12/09/2012  Decreased Interest 0  Down, Depressed, Hopeless 0  PHQ - 2 Score 0      Review of Systems  All other systems reviewed and are negative.      Objective:   Physical Exam        Assessment & Plan:

## 2014-10-01 NOTE — Patient Instructions (Signed)
Joint Injection  Care After  Refer to this sheet in the next few days. These instructions provide you with information on caring for yourself after you have had a joint injection. Your caregiver also may give you more specific instructions. Your treatment has been planned according to current medical practices, but problems sometimes occur. Call your caregiver if you have any problems or questions after your procedure.  After any type of joint injection, it is not uncommon to experience:  · Soreness, swelling, or bruising around the injection site.  · Mild numbness, tingling, or weakness around the injection site caused by the numbing medicine used before or with the injection.  It also is possible to experience the following effects associated with the specific agent after injection:  · Iodine-based contrast agents:  ¨ Allergic reaction (itching, hives, widespread redness, and swelling beyond the injection site).  · Corticosteroids (These effects are rare.):  ¨ Allergic reaction.  ¨ Increased blood sugar levels (If you have diabetes and you notice that your blood sugar levels have increased, notify your caregiver).  ¨ Increased blood pressure levels.  ¨ Mood swings.  · Hyaluronic acid in the use of viscosupplementation.  ¨ Temporary heat or redness.  ¨ Temporary rash and itching.  ¨ Increased fluid accumulation in the injected joint.  These effects all should resolve within a day after your procedure.   HOME CARE INSTRUCTIONS  · Limit yourself to light activity the day of your procedure. Avoid lifting heavy objects, bending, stooping, or twisting.  · Take prescription or over-the-counter pain medication as directed by your caregiver.  · You may apply ice to your injection site to reduce pain and swelling the day of your procedure. Ice may be applied 03-04 times:  ¨ Put ice in a plastic bag.  ¨ Place a towel between your skin and the bag.  ¨ Leave the ice on for no longer than 15-20 minutes each time.  SEEK  IMMEDIATE MEDICAL CARE IF:   · Pain and swelling get worse rather than better or extend beyond the injection site.  · Numbness does not go away.  · Blood or fluid continues to leak from the injection site.  · You have chest pain.  · You have swelling of your face or tongue.  · You have trouble breathing or you become dizzy.  · You develop a fever, chills, or severe tenderness at the injection site that last longer than 1 day.  MAKE SURE YOU:  · Understand these instructions.  · Watch your condition.  · Get help right away if you are not doing well or if you get worse.  Document Released: 12/22/2010 Document Revised: 07/03/2011 Document Reviewed: 12/22/2010  Exit[employer]® Patient Information ©2015 [employer]. This information is not intended to replace advice given to you by your health care provider. Make sure you discuss any questions you have with your health care provider.

## 2014-10-01 NOTE — Progress Notes (Signed)
Shoulder injection Left Without ultrasound guidance)  Indication:Left post strokeShoulder pain not relieved by medication management and other conservative care.Pain is interfering with physical therapy and ADLs    Informed consent was obtained after describing risks and benefits of the procedure with the patient, this includes bleeding, bruising, infection and medication side effects. The patient wishes to proceed and has given written consent. Patient was placed in a seated position. TheLeftshoulder was marked and prepped with betadine in the subacromial area. A 25-gauge 1-1/2 inch needle was inserted into the subacromial area. After negative draw back for blood, a solution containing 1 mL of 6 mg per ML betamethasone and 4 mL of 1% lidocaine was injected. A band aid was applied. The patient tolerated the procedure well. Post procedure instructions were given.

## 2014-10-02 ENCOUNTER — Telehealth (HOSPITAL_COMMUNITY): Payer: Self-pay | Admitting: Cardiology

## 2014-10-02 DIAGNOSIS — I5042 Chronic combined systolic (congestive) and diastolic (congestive) heart failure: Secondary | ICD-10-CM | POA: Diagnosis not present

## 2014-10-02 DIAGNOSIS — I2510 Atherosclerotic heart disease of native coronary artery without angina pectoris: Secondary | ICD-10-CM | POA: Diagnosis not present

## 2014-10-02 DIAGNOSIS — J449 Chronic obstructive pulmonary disease, unspecified: Secondary | ICD-10-CM | POA: Diagnosis not present

## 2014-10-02 DIAGNOSIS — M6281 Muscle weakness (generalized): Secondary | ICD-10-CM | POA: Diagnosis not present

## 2014-10-02 DIAGNOSIS — M4800 Spinal stenosis, site unspecified: Secondary | ICD-10-CM | POA: Diagnosis not present

## 2014-10-02 DIAGNOSIS — I69354 Hemiplegia and hemiparesis following cerebral infarction affecting left non-dominant side: Secondary | ICD-10-CM | POA: Diagnosis not present

## 2014-10-02 NOTE — Telephone Encounter (Signed)
Malorie called after patients home OT visit with reports of 2+ BLE edema   Spoke with pt regarding sxs Pt states " I told her not to bother yall, I'm doing fine" Report Torsemide was recently stopped due to rapid weight loss Pt does still have LE edema however denies, cough, CP, SOB or tremendous weight gain  Pt did take torsemide x 1 today to help with swelling i advised him to also use his compression stockings Voiced understanding

## 2014-10-05 ENCOUNTER — Telehealth: Payer: Self-pay | Admitting: Neurology

## 2014-10-05 NOTE — Telephone Encounter (Signed)
Spoke with patient and informed him we do have form, Dr Erlinda Hong is out of office and form is in Dr Clydene Fake office for signature. Informed him Dr Leonie Man is in hospital this week but will be in this office periodically this week and can sign. Assured pt the form will be faxed to [facility] this week. He verbalized understanding, appreciation.

## 2014-10-05 NOTE — Telephone Encounter (Signed)
Patient called stating he is to have cataract surgery on 10/12/14. He faxed a form to Dr Erlinda Hong to  be filled out from Dr Leeroy Bock at [place] stating he could have surgery. He is inquiring if Dr Erlinda Hong received this form. Please call and advise. He can be reached at [phone].

## 2014-10-12 DIAGNOSIS — H2512 Age-related nuclear cataract, left eye: Secondary | ICD-10-CM | POA: Diagnosis not present

## 2014-10-12 DIAGNOSIS — H25812 Combined forms of age-related cataract, left eye: Secondary | ICD-10-CM | POA: Diagnosis not present

## 2014-10-21 ENCOUNTER — Telehealth: Payer: Self-pay | Admitting: *Deleted

## 2014-10-21 NOTE — Telephone Encounter (Signed)
The newer agents can be stopped 48 hours prior to procedure. These include Pradaxa, Eliquis, Xarelto

## 2014-10-21 NOTE — Telephone Encounter (Signed)
I see MR Boivin is on the schedule for 7/11 for an SI injection and appointment note says stop eliquis 2 days prior.  Is this correct? I thought all BT should be 5-6 days.

## 2014-10-28 ENCOUNTER — Other Ambulatory Visit: Payer: Self-pay | Admitting: Cardiovascular Disease

## 2014-10-28 NOTE — Telephone Encounter (Signed)
Rx(s) sent to pharmacy electronically.  

## 2014-11-02 ENCOUNTER — Encounter: Payer: Medicare Other | Attending: Physical Medicine & Rehabilitation

## 2014-11-02 ENCOUNTER — Ambulatory Visit (HOSPITAL_BASED_OUTPATIENT_CLINIC_OR_DEPARTMENT_OTHER): Payer: Medicare Other | Admitting: Physical Medicine & Rehabilitation

## 2014-11-02 ENCOUNTER — Encounter: Payer: Self-pay | Admitting: Physical Medicine & Rehabilitation

## 2014-11-02 VITALS — BP 91/28 | HR 43 | Resp 14

## 2014-11-02 DIAGNOSIS — G8190 Hemiplegia, unspecified affecting unspecified side: Secondary | ICD-10-CM | POA: Diagnosis not present

## 2014-11-02 DIAGNOSIS — I69398 Other sequelae of cerebral infarction: Secondary | ICD-10-CM | POA: Insufficient documentation

## 2014-11-02 DIAGNOSIS — R269 Unspecified abnormalities of gait and mobility: Secondary | ICD-10-CM | POA: Diagnosis not present

## 2014-11-02 DIAGNOSIS — M533 Sacrococcygeal disorders, not elsewhere classified: Secondary | ICD-10-CM

## 2014-11-02 DIAGNOSIS — I610 Nontraumatic intracerebral hemorrhage in hemisphere, subcortical: Secondary | ICD-10-CM | POA: Diagnosis not present

## 2014-11-02 DIAGNOSIS — R208 Other disturbances of skin sensation: Secondary | ICD-10-CM | POA: Insufficient documentation

## 2014-11-02 MED ORDER — TRAMADOL HCL 50 MG PO TABS: 50.0000 mg | ORAL_TABLET | Freq: Four times a day (QID) | ORAL | Status: DC | PRN

## 2014-11-02 NOTE — Progress Notes (Signed)
   Right sacroiliac injection under fluoroscopic guidance  Indication: Right Low back and buttocks pain not relieved by medication management and other conservative care.Last Eliquis dose on Friday, 10/30/2014  Informed consent was obtained after describing risks and benefits of the procedure with the patient, this includes bleeding, bruising, infection, paralysis and medication side effects. The patient wishes to proceed and has given written consent. The patient was placed in a prone position. The lumbar and sacral area was marked and prepped with Betadine. A 25-gauge 1-1/2 inch needle was inserted into the skin and subcutaneous tissue and 1 mL of 1% lidocaine was injected. Then a 25-gauge 3 inch spinal needle was inserted under fluoroscopic guidance into the Right sacroiliac joint. AP and lateral images were utilized. Omnipaque 180x0.5 mL under live fluoroscopy demonstrated no intravascular uptake. Then a solution containing one ML of 6 mgper ml betamethasone and 2 ML of 1% lidocaine MPF was injected x1.5 mL. Patient tolerated the procedure well. Post procedure instructions were given. Please see post procedure form.  Preinjection pain score 5/10 Post injection pain score 0/10  If duration of responses greater than or equal to 6 weeks may schedule for every 3 month injections  Instructions to resume Eliquis tomorrow

## 2014-11-02 NOTE — Progress Notes (Signed)
  PROCEDURE RECORD [facility]   Name: DENSON NICCOLI DOB:[birth year] MRN: [id number]  Date:11/02/2014  Physician: Alysia Penna, MD    Nurse/CMA: Mancel Parsons  Allergies: No Known Allergies  Consent Signed: Yes.    Is patient diabetic? No.  CBG today? *  Pregnant: No. LMP: No LMP for male patient. (age 65-55)  Anticoagulants: no Anti-inflammatory: no Antibiotics: no  Procedure: right sacroiliac steroid injection  Position: Prone Start Time: 11:25 am     End Time: 11:27 am  Fluoro Time: 8  RN/CMA Rolan Bucco Abbigayle Toole    Time 11:10 am 11:30 am    BP 92/28 94/21    Pulse 43 51    Respirations 14 14    O2 Sat 95 96    S/S 6 6    Pain Level 5/10 0/10     D/C home with brother, patient A & O X 3, D/C instructions reviewed, and sits independently.

## 2014-11-02 NOTE — Patient Instructions (Signed)
Sacroiliac injection was performed today. A combination of a naming medicine plus a cortisone medicine was injected. The injection was done under x-ray guidance. This procedure has been performed to help reduce low back and buttocks pain as well as potentially hip pain. The duration of this injection is variable lasting from hours to  Months. It may repeated if needed. 

## 2014-11-03 ENCOUNTER — Encounter: Payer: Self-pay | Admitting: Neurology

## 2014-11-03 ENCOUNTER — Ambulatory Visit (INDEPENDENT_AMBULATORY_CARE_PROVIDER_SITE_OTHER): Payer: Medicare Other | Admitting: Neurology

## 2014-11-03 VITALS — BP 113/40 | HR 47 | Ht 70.0 in | Wt 170.0 lb

## 2014-11-03 DIAGNOSIS — Z7901 Long term (current) use of anticoagulants: Secondary | ICD-10-CM | POA: Diagnosis not present

## 2014-11-03 DIAGNOSIS — I480 Paroxysmal atrial fibrillation: Secondary | ICD-10-CM | POA: Diagnosis not present

## 2014-11-03 DIAGNOSIS — I639 Cerebral infarction, unspecified: Secondary | ICD-10-CM

## 2014-11-03 DIAGNOSIS — I619 Nontraumatic intracerebral hemorrhage, unspecified: Secondary | ICD-10-CM

## 2014-11-03 DIAGNOSIS — I509 Heart failure, unspecified: Secondary | ICD-10-CM | POA: Diagnosis not present

## 2014-11-03 NOTE — Patient Instructions (Addendum)
-   continue eliquis and lipitor for stroke prevention. - continue other meds for Afib and CHF - check BP at home. BP goal < 130 to avoid bleeding while you are on blood thinners - continue follow up with cardiology - Follow up with your primary care physician for stroke risk factor modification. Recommend maintain blood pressure goal <130/80, diabetes with hemoglobin A1c goal below 6.5% and lipids with LDL cholesterol goal below 70 mg/dL.  - continue home exercise.  - follow up in 6 months.

## 2014-11-05 NOTE — Progress Notes (Signed)
STROKE NEUROLOGY FOLLOW UP NOTE  NAME: [name] DOB: 11-May-1950  REASON FOR VISIT: stroke follow up HISTORY FROM: chart and pt  Today we had the pleasure of seeing [name] in follow-up at our Neurology Clinic. Pt was accompanied by brother.   History Summary [name] is a 64 y.o. male who has known Afib on Eliquis was admitted on 04/07/14 for suddenly felt dizzy, left facial droop and left sided weakness, arm>leg. CT head showed a right IC basal ganglia hemorrhage with ventricular extension. BP 158/85. s/p Kcentra. Repeat CT brain on 04/08/14 Right thalamic intraparenchymal hematoma stable and without hydrocephalus. CT brain on 04/10/14 of brain revealed Stable intra-axial hemorrhage centered at the right thalamus with small volume intraventricular extension of blood and no ventriculomegaly. Patient continues to have left upper and lower extremity weakness but has improved. Pt was discharged to rehab.   07/01/14 follow up - the patient has been doing better. He stayed in rehab for 3 weeks and then sent to SNF. On 05/03/14 he had repeat CT showed resolution of the ICH and ASA was started. His left LE strength almost back to baseline and he can walk with walker. LUE more weak at the shoulder. He is going to be discharged home next week. He follows with Dr. Haroldine Laws at cardiology next week.   Interval History During the interval time, pt has been doing well. Resumed eliquis in 06/2014 after discussion with Dr. Haroldine Laws, tolerating well. He had left eye cataract on 10/12/14 and will have left eye surgery next Monday. He recently got cortisol injection to his right hip for pain management, stopped Eliquis for 3 days. Currently resumed. Otherwise no complains. BP 113/40 in clinic today.  REVIEW OF SYSTEMS: Full 14 system review of systems performed and notable only for those listed below and in HPI above, all others are negative:  Constitutional:   Cardiovascular:    Ear/Nose/Throat:   Skin:  Eyes:   Respiratory:   Gastroitestinal:   Genitourinary:  Hematology/Lymphatic:   Endocrine:  Musculoskeletal:  Walking difficulty Allergy/Immunology:   Neurological:   Psychiatric:  Sleep:   The following represents the patient's updated allergies and side effects list: No Known Allergies  The neurologically relevant items on the patient's problem list were reviewed on today's visit.  Neurologic Examination  A problem focused neurological exam (12 or more points of the single system neurologic examination, vital signs counts as 1 point, cranial nerves count for 8 points) was performed.  Blood pressure 113/40, pulse 47, height 5\' 10"  (1.778 m), weight 170 lb (77.111 kg).  General - Well nourished, well developed, in no apparent distress.  Ophthalmologic - Sharp disc margins OU.  Cardiovascular - Regular rate and rhythm with no murmur.  Mental Status -  Level of arousal and orientation to time, place, and person were intact. Language including expression, naming, repetition, comprehension, reading, and writing was assessed and found intact. Attention span and concentration were normal. Recent and remote memory were 3/3 registration and 2/3 delayed recall. Fund of Knowledge was assessed and was intact.  Cranial Nerves II - XII - II - Visual field intact OU. III, IV, VI - Extraocular movements intact. V - Facial sensation symmetrical. VII - Facial movement intact bilaterally. VIII - Hearing & vestibular intact bilaterally. X - Palate elevates symmetrically. XI - Chin turning & shoulder shrug intact bilaterally. XII - Tongue protrusion intact.  Motor Strength - The patient's strength was normal in all extremities except left hand  mild dexterity difficulty and pronator drift was absent.  Bulk was normal and fasciculations were absent.   Motor Tone - Muscle tone was assessed at the neck and appendages and was normal.  Reflexes - The patient's  reflexes were normal in all extremities and he had no pathological reflexes.  Sensory - Light touch, temperature/pinprick were assessed and were symmetrical.   Coordination - The patient had ataxia on FTN at left hand which is out of proportion to the weakness.  Tremor was absent.  Gait and Station - walk with walker, stable, no fall.  Data reviewed: I personally reviewed the images and agree with the radiology interpretations.  Ct Head Wo Contrast 04/10/2014  1. Stable intra-axial hemorrhage centered at the right thalamus with small volume intraventricular extension of blood.  2. Stable mild regional mass effect. No ventriculomegaly.  3. No new intracranial abnormality.   04/07/14:  1. Acute posterior RIGHT basal ganglia and internal capsule hemorrhage measuring 15 mm x 23 mm. 2. 4 mm of RIGHT to LEFT midline shift. 3. Area of low attenuation in the posterior LEFT temporal lobe may represent subacute ischemia but the appearance is more suggestive of either an old infarct or asymmetric chronic ischemic white matter disease.  04/08/14: Stable exams inch yesterday. Right thalamic intraparenchymal hematoma again measured at 15 x 24 mm. Mild surrounding edema. Small amount of intraventricular hemorrhage, not increased and without hydrocephalus.  CUS - - The vertebral arteries appear patent with antegrade flow. - Findings consistent with 65 - 47 percent stenosis involving the right internal carotid artery. - Findings consistent with 60 - 79 percent stenosis involving the left internal carotid artery.  EKG Ectopic atrial tachycardia, unifocal, Nonspecific ST and T wave abnormality  Assessment: As you may recall, he is a 64 y.o. Caucasian male with PMH of known Afib on Eliquis was admitted on 04/07/14 for right BG hemorrhage with ventricular extension. BP 158/85. s/p Kcentra. Repeat CT showed stable hematoma without hydrocephalus. Patient continued to have left upper  and lower extremity weakness but has improved. Pt was discharged to rehab. In 04/2014 repeat CT showed resolution of hemorrhage and ASA started. After discuss with his cardiologist Dr. Haroldine Laws, resumed eliquis in 06/2014. So far pt tolerating the medication well.   Plan:  - continue eliquis and lipitor for stroke prevention. - check BP at home. BP goal < 130 to avoid bleeding while on blood thinners - continue follow up with cardiology for afib and CHF - Follow up with your primary care physician for stroke risk factor modification. Recommend maintain blood pressure goal <130/80, diabetes with hemoglobin A1c goal below 6.5% and lipids with LDL cholesterol goal below 70 mg/dL.  - continue home exercise.  - RTC in 6 months.  No orders of the defined types were placed in this encounter.    No orders of the defined types were placed in this encounter.    Patient Instructions  - continue eliquis and lipitor for stroke prevention. - continue other meds for Afib and CHF - check BP at home. BP goal < 130 to avoid bleeding while you are on blood thinners - continue follow up with cardiology - Follow up with your primary care physician for stroke risk factor modification. Recommend maintain blood pressure goal <130/80, diabetes with hemoglobin A1c goal below 6.5% and lipids with LDL cholesterol goal below 70 mg/dL.  - continue home exercise.  - follow up in 6 months.    Rosalin Hawking, MD PhD [facility] [phone]  [address], [place], [place] [address] [phone]

## 2014-11-09 DIAGNOSIS — H25811 Combined forms of age-related cataract, right eye: Secondary | ICD-10-CM | POA: Diagnosis not present

## 2014-11-09 DIAGNOSIS — H2511 Age-related nuclear cataract, right eye: Secondary | ICD-10-CM | POA: Diagnosis not present

## 2014-11-16 ENCOUNTER — Other Ambulatory Visit (HOSPITAL_COMMUNITY): Payer: Self-pay | Admitting: Anesthesiology

## 2014-12-05 ENCOUNTER — Other Ambulatory Visit (HOSPITAL_COMMUNITY): Payer: Self-pay | Admitting: Internal Medicine

## 2014-12-17 ENCOUNTER — Ambulatory Visit (HOSPITAL_COMMUNITY)
Admission: RE | Admit: 2014-12-17 | Discharge: 2014-12-17 | Disposition: A | Discharge status: Home / Self Care | Payer: Medicare Other | Source: Ambulatory Visit | Attending: Cardiology | Admitting: Cardiology

## 2014-12-17 ENCOUNTER — Encounter (HOSPITAL_COMMUNITY): Payer: Self-pay

## 2014-12-17 VITALS — BP 100/58 | HR 49 | Wt 163.4 lb

## 2014-12-17 DIAGNOSIS — Z79899 Other long term (current) drug therapy: Secondary | ICD-10-CM | POA: Diagnosis not present

## 2014-12-17 DIAGNOSIS — Z9981 Dependence on supplemental oxygen: Secondary | ICD-10-CM | POA: Diagnosis not present

## 2014-12-17 DIAGNOSIS — E785 Hyperlipidemia, unspecified: Secondary | ICD-10-CM | POA: Diagnosis not present

## 2014-12-17 DIAGNOSIS — I2510 Atherosclerotic heart disease of native coronary artery without angina pectoris: Secondary | ICD-10-CM | POA: Insufficient documentation

## 2014-12-17 DIAGNOSIS — I482 Chronic atrial fibrillation, unspecified: Secondary | ICD-10-CM

## 2014-12-17 DIAGNOSIS — I5042 Chronic combined systolic (congestive) and diastolic (congestive) heart failure: Secondary | ICD-10-CM | POA: Diagnosis not present

## 2014-12-17 DIAGNOSIS — N189 Chronic kidney disease, unspecified: Secondary | ICD-10-CM | POA: Insufficient documentation

## 2014-12-17 DIAGNOSIS — I2781 Cor pulmonale (chronic): Secondary | ICD-10-CM | POA: Insufficient documentation

## 2014-12-17 DIAGNOSIS — I428 Other cardiomyopathies: Secondary | ICD-10-CM | POA: Insufficient documentation

## 2014-12-17 DIAGNOSIS — J439 Emphysema, unspecified: Secondary | ICD-10-CM | POA: Diagnosis not present

## 2014-12-17 DIAGNOSIS — Z7902 Long term (current) use of antithrombotics/antiplatelets: Secondary | ICD-10-CM | POA: Diagnosis not present

## 2014-12-17 DIAGNOSIS — Z7982 Long term (current) use of aspirin: Secondary | ICD-10-CM | POA: Diagnosis not present

## 2014-12-17 DIAGNOSIS — Z951 Presence of aortocoronary bypass graft: Secondary | ICD-10-CM | POA: Diagnosis not present

## 2014-12-17 DIAGNOSIS — I69154 Hemiplegia and hemiparesis following nontraumatic intracerebral hemorrhage affecting left non-dominant side: Secondary | ICD-10-CM | POA: Insufficient documentation

## 2014-12-17 DIAGNOSIS — I129 Hypertensive chronic kidney disease with stage 1 through stage 4 chronic kidney disease, or unspecified chronic kidney disease: Secondary | ICD-10-CM | POA: Insufficient documentation

## 2014-12-17 DIAGNOSIS — I509 Heart failure, unspecified: Secondary | ICD-10-CM | POA: Diagnosis present

## 2014-12-17 DIAGNOSIS — Z95828 Presence of other vascular implants and grafts: Secondary | ICD-10-CM | POA: Insufficient documentation

## 2014-12-17 DIAGNOSIS — J449 Chronic obstructive pulmonary disease, unspecified: Secondary | ICD-10-CM | POA: Insufficient documentation

## 2014-12-17 DIAGNOSIS — Z953 Presence of xenogenic heart valve: Secondary | ICD-10-CM | POA: Insufficient documentation

## 2014-12-17 DIAGNOSIS — I5022 Chronic systolic (congestive) heart failure: Secondary | ICD-10-CM

## 2014-12-17 LAB — BASIC METABOLIC PANEL
Anion gap: 6 (ref 5–15)
BUN: 30 mg/dL — AB (ref 6–20)
CHLORIDE: 108 mmol/L (ref 101–111)
CO2: 25 mmol/L (ref 22–32)
CREATININE: 2.19 mg/dL — AB (ref 0.61–1.24)
Calcium: 8.5 mg/dL — ABNORMAL LOW (ref 8.9–10.3)
GFR calc Af Amer: 35 mL/min — ABNORMAL LOW (ref >60)
GFR calc non Af Amer: 30 mL/min — ABNORMAL LOW (ref >60)
GLUCOSE: 97 mg/dL (ref 65–99)
Potassium: 5.2 mmol/L — ABNORMAL HIGH (ref 3.5–5.1)
Sodium: 139 mmol/L (ref 135–145)

## 2014-12-17 LAB — CBC
HCT: 30.6 % — ABNORMAL LOW (ref 39.0–52.0)
Hemoglobin: 9.7 g/dL — ABNORMAL LOW (ref 13.0–17.0)
MCH: 30.6 pg (ref 26.0–34.0)
MCHC: 31.7 g/dL (ref 30.0–36.0)
MCV: 96.5 fL (ref 78.0–100.0)
PLATELETS: 116 10*3/uL — AB (ref 150–400)
RBC: 3.17 MIL/uL — ABNORMAL LOW (ref 4.22–5.81)
RDW: 14.5 % (ref 11.5–15.5)
WBC: 3.5 10*3/uL — ABNORMAL LOW (ref 4.0–10.5)

## 2014-12-17 NOTE — Patient Instructions (Signed)
Routine lab work today. Will notify you of abnormal results  FOLLOW UP in 6 MONTHS.   Your provider has scheduled you for an Echocardiogram in 6 MONTHS An echocardiogram, or echocardiography, uses sound waves (ultrasound) to produce an image of your heart. The echocardiogram is simple, painless, obtained within a short period of time, and offers valuable information to your health care provider. The images from an echocardiogram can provide information such as:  Evidence of coronary artery disease (CAD).  Heart size.  Heart muscle function.  Heart valve function.  Aneurysm detection.  Evidence of a past heart attack.  Fluid buildup around the heart.  Heart muscle thickening.  Assess heart valve function. LET [facility] CARE PROVIDER KNOW ABOUT:  Any allergies you have.  All medicines you are taking, including vitamins, herbs, eye drops, creams, and over-the-counter medicines.  Previous problems you or members of your family have had with the use of anesthetics.  Any blood disorders you have.  Previous surgeries you have had.  Medical conditions you have.  Possibility of pregnancy, if this applies. BEFORE THE PROCEDURE  No special preparation is needed. Eat and drink normally.  PROCEDURE   In order to produce an image of your heart, gel will be applied to your chest and a wand-like tool (transducer) will be moved over your chest. The gel will help transmit the sound waves from the transducer. The sound waves will harmlessly bounce off your heart to allow the heart images to be captured in real-time motion. These images will then be recorded.  You may need an IV to receive a medicine that improves the quality of the pictures. AFTER THE PROCEDURE You may return to your normal schedule including diet, activities, and medicines, unless your health care provider tells you otherwise.

## 2014-12-17 NOTE — Progress Notes (Signed)
Patient ID: [name], male   DOB: December 06, 1950, 64 y.o.   MRN: [id number] EP: Dr Lovena Le   HPI: [name] is 64 y.o. male with past medical history of severe aortic stenosis s/p tissue AV replacement (09/2012) atrial fibrillation s/p MAZE (09/2012)  chronic combined systolic/diastolic HF, RV failure, HTN, PAD s/p L iliac stent in 2010 and aortobifemoral bypass graft, ETOH abuse, severe COPD with cor pulmonale on home oxygen, CAD s/p CABG with AV replacement and back pain from spinal stenosis.   Admitted and treated for acute failure complicated by listeria bacteremia and Afib RVR 09/2013. Diuresed with IV lasix transitioned to po lasix.He had TEE DC-CV but went back into afib with controlled rate on amiodarone and eliquis. He was treated with 2 weeks of antibiotics. Discharge weight was 226 pounds.  Amiodarone was stopped as patient did not hold NSR.    Patient was admitted to [name] in 12/15 with right basal ganglia intracerebral hemorrhage and left hemiparesis.  He had been on Eliquis.  He eventually was sent to inpatient rehab, where due to a combination of ongoing diuretic use and poor po intake, he developed AKI and hyponatremia.  Diuretics and ACEI were held, and creatinine returned to baseline.    He returns for follow up. Last visit torsemide was changed to as needed for weight 185 or greater. Says weight is dropping. Taking torsemide 6-8 times over the last couple of months. Says he takes torsemide for leg edema and weight gain. Weight at home has gone down from 170 to 163 pounds. Denies SOB/PND/Orthopnea. Appetite poor. No bleeding problems. Taking all medicattions. Lives alone. Limited mobility.   ECHO 30-35% 08/2012  ECHO 08/2013 EF 50-55% Grade II DD  TEE 6/15 EF 40-45%, bioprosthetic aortic valve ok  PFTs 9/14 FEV1 1.1L (34%) FVC 2.57L (57%) FEF 25-75% (17%) DLCO 41%  Labs (9/15): LDL 106 Labs (05/05/14): K 3.4, creatinine 1.34, digoxin 0.4 Labs 06/10/2014: K 5.6 Creatinine 2.7 BNP  215 Labs 06/19/2014: K 4.9 Creatinine 2.28 K and spiro stopped.  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 Drink alcohol occasionally . Denies drug use. Lives alone   FH: Father died MI at 5 Brother MI   ROS: All systems negative except as listed in HPI, PMH and Problem List.  Past Medical History  Diagnosis Date  . Hypertension   . Gout   . Aortic stenosis     echo 06/19/08 with nomal LV function, moderate concentric LVH moderate aortic stenosis area 0.99 cm squared, peak gradient of 50 and mean of 31  . Hyperlipidemia   . Atrial fibrillation with RVR, was in atrial fib in 04/2011 09/16/2012    a) s/p MAZE (09/2012)   . Tobacco use 09/16/2012  . Heart murmur   . Chronic combined systolic and diastolic CHF (congestive heart failure) 09/16/2012    a) ECHO (08/2013) EF 50-55%, grade II DD b) TEE ECHO (09/2013): 40-45%, bioprosthesis present, mild Dustin  . Aortic stenosis, severe 09/23/2012  . CAD (coronary artery disease), single vessel disease 09/23/2012    a) CABG (SVG to PDA and PL) wtih AVR (09/2012)   . PAD (peripheral artery disease), decreased bil. ABIs 09/23/2012    a) ABIs (11/2013): Right mild arterial insufficiency, L normal; aroto-bifem bypass graft: not well visualized, bilateral SFAs = to greater than 50% in dimaeter reduction   . Gout flare. 09/29/12, Lt knee, improved with colchicine. 09/30/2012  . H/O aortic valve replacement   .  AS (aortic stenosis) with AVR with pericardial tissue valve  09/30/2013  . Back pain, spinal stenosis 09/30/2013  . COPD (chronic obstructive pulmonary disease)   . Bilateral carotid artery disease     Current Outpatient Prescriptions  Medication Sig Dispense Refill  . albuterol (PROVENTIL HFA;VENTOLIN HFA) 108 (90 BASE) MCG/ACT inhaler Inhale 2 puffs into the lungs every 6 (six) hours as needed for wheezing or shortness of breath. 1 Inhaler 2  . amiodarone (PACERONE) 200 MG tablet Take 1 tablet (200 mg total) by mouth  daily. 30 tablet 6  . apixaban (ELIQUIS) 5 MG TABS tablet Take 1 tablet (5 mg total) by mouth 2 (two) times daily. 60 tablet 6  . atorvastatin (LIPITOR) 80 MG tablet Take 1 tablet (80 mg total) by mouth daily at 6 PM. 30 tablet 6  . budesonide-formoterol (SYMBICORT) 80-4.5 MCG/ACT inhaler Inhale 2 puffs into the lungs 2 (two) times daily. 1 Inhaler 12  . carvedilol (COREG) 25 MG tablet Take 1 tablet (25 mg total) by mouth 2 (two) times daily with a meal. 60 tablet 5  . diclofenac sodium (VOLTAREN) 1 % GEL Apply 2 g topically 4 (four) times daily. To right knee    . digoxin (LANOXIN) 0.25 MG tablet Take 1 tablet (0.25 mg total) by mouth daily. PATIENT NEEDS TO CONTACT OFFICE FOR ADDITIONAL REFILLS (Patient taking differently: Take 0.25 mg by mouth every other day. PATIENT NEEDS TO CONTACT OFFICE FOR ADDITIONAL REFILLS) 30 tablet 1  . folic acid (FOLVITE) 1 MG tablet Take 1 tablet (1 mg total) by mouth daily. 30 tablet 6  . lisinopril (PRINIVIL,ZESTRIL) 2.5 MG tablet TAKE 1 TABLET (2.5 MG TOTAL) BY MOUTH DAILY. 30 tablet 3  . mirtazapine (REMERON) 7.5 MG tablet Take 1 tablet (7.5 mg total) by mouth at bedtime as needed (sleep). 30 tablet 3  . OXYGEN-HELIUM IN Inhale 1-2 L into the lungs continuous. Is in the habit of not using his oxygen when he leaves the house. Uses 1L continuous normally Uses 2L for exertion    . pantoprazole (PROTONIX) 40 MG tablet Take 1 tablet (40 mg total) by mouth daily as needed (indigestion/reflux). 30 tablet 3  . PRESCRIPTION MEDICATION Apply 1 application topically daily.    Marland Kitchen tiZANidine (ZANAFLEX) 4 MG tablet Take 4 mg by mouth once.   1  . torsemide (DEMADEX) 20 MG tablet Take 1 tablet (20 mg total) by mouth daily. As needed for weight 185 or greater 60 tablet 3  . traMADol (ULTRAM) 50 MG tablet Take 1 tablet (50 mg total) by mouth every 6 (six) hours as needed for moderate pain. 120 tablet 2  . torsemide (DEMADEX) 20 MG tablet Take 20 mg as needed if weight is 185lbs  or greater (Patient not taking: Reported on 12/17/2014) 90 tablet 3   No current facility-administered medications for this encounter.   Filed Vitals:   12/17/14 0958  BP: 100/58  Pulse: 49  Weight: 163 lb 6 oz (74.106 kg)  SpO2: 96%   PHYSICAL EXAM: General:  Sitting in the wheel chair. No resp difficulty. Brother present HEENT: normal Neck: supple. JVP flat  Carotids 2+ bilaterally; no bruits. No lymphadenopathy or thryomegaly appreciated. Cor: PMI normal. RRR, 2/6 SEM RUSB.  Lungs: barrel chested. diminshed throughout fine crackles in bases Abdomen: soft, nontender, nondistended. No hepatosplenomegaly. No bruits or masses. Good bowel sounds. Extremities: no cyanosis, clubbing, rash. No lower extremity edema.    Neuro: alert & orientedx3, cranial nerves grossly intact. Left-sided weakness.  ASSESSMENT & PLAN:  1. Biventricular HF (R>L): Suspect mixed ischemic/nonischemic cardiomyopathy,  EF 40-45% on 6/15 TEE.  He has had cor pulmonale with prominent RV failure from COPD.  Still has left-sided weakness.   NYHA II.  Volume status low. Weight continued to fall. I have asked him to only take if weight above 185 pounds.  HR 49. Stop dioxin. Continue carvedilol 25 mg twice a day and lisinopril 2.5 mg daily.  Check ECHO next visit.  2. Severe COPD with cor pulmonale: Using continuous oxygen. Follows with Dr Lake Bells.  3 Chronic A fib: s/p MAZE. Failed DC-CV x2 in November 2014.  Failed DC-CV (09/2013).  However, he was in NSR in 12/15 at time of [facility]ks admission.  Continue amiodarone 200 mg daily.  He will need yearly eye exam and baseline PFTs.  -In the past he had difficulty with coumadin and regulating INR. Continue eliquis 5 mg twice a day . No bleeding problems. Check TSH next visit.  4. CAD: s/p CABG bioprosthetic AVR.  Stop aspirin. He had difficulty with coumadin He is on ASA 81 now that he is off anticoagulation.  Continue atorvastatin 80 mg daily.    5. Intracerebral hemorrhage:  Still has left-sided weakness.  Discussed anticoagulant options. In the past he had difficulty with coumadin and regulating INR. Continue eliquis per neuro.   6. CKD: Check BMET now.       Check BMET and CBC now.   Follow up in ow up in 6 months.    CLEGG,AMY NP-C  12/17/2014

## 2014-12-23 ENCOUNTER — Other Ambulatory Visit: Payer: Self-pay | Admitting: Cardiovascular Disease

## 2014-12-23 NOTE — Telephone Encounter (Signed)
Rx request sent to pharmacy.  

## 2015-01-04 ENCOUNTER — Other Ambulatory Visit (HOSPITAL_COMMUNITY): Payer: Self-pay | Admitting: Pharmacist

## 2015-01-04 ENCOUNTER — Other Ambulatory Visit (HOSPITAL_COMMUNITY): Payer: Self-pay | Admitting: *Deleted

## 2015-01-04 MED ORDER — APIXABAN 5 MG PO TABS: 5.0000 mg | ORAL_TABLET | Freq: Two times a day (BID) | ORAL | Status: DC

## 2015-01-11 ENCOUNTER — Other Ambulatory Visit (HOSPITAL_COMMUNITY): Payer: Self-pay | Admitting: Adult Health

## 2015-01-13 DIAGNOSIS — D649 Anemia, unspecified: Secondary | ICD-10-CM | POA: Diagnosis not present

## 2015-01-13 DIAGNOSIS — I2510 Atherosclerotic heart disease of native coronary artery without angina pectoris: Secondary | ICD-10-CM | POA: Diagnosis not present

## 2015-01-13 DIAGNOSIS — I739 Peripheral vascular disease, unspecified: Secondary | ICD-10-CM | POA: Diagnosis not present

## 2015-01-13 DIAGNOSIS — I10 Essential (primary) hypertension: Secondary | ICD-10-CM | POA: Diagnosis not present

## 2015-01-13 DIAGNOSIS — Z79899 Other long term (current) drug therapy: Secondary | ICD-10-CM | POA: Diagnosis not present

## 2015-01-13 DIAGNOSIS — Z125 Encounter for screening for malignant neoplasm of prostate: Secondary | ICD-10-CM | POA: Diagnosis not present

## 2015-01-13 DIAGNOSIS — E782 Mixed hyperlipidemia: Secondary | ICD-10-CM | POA: Diagnosis not present

## 2015-01-20 DIAGNOSIS — I10 Essential (primary) hypertension: Secondary | ICD-10-CM | POA: Diagnosis not present

## 2015-01-20 DIAGNOSIS — E782 Mixed hyperlipidemia: Secondary | ICD-10-CM | POA: Diagnosis not present

## 2015-01-20 DIAGNOSIS — Z23 Encounter for immunization: Secondary | ICD-10-CM | POA: Diagnosis not present

## 2015-01-20 DIAGNOSIS — I2510 Atherosclerotic heart disease of native coronary artery without angina pectoris: Secondary | ICD-10-CM | POA: Diagnosis not present

## 2015-01-20 DIAGNOSIS — D500 Iron deficiency anemia secondary to blood loss (chronic): Secondary | ICD-10-CM | POA: Diagnosis not present

## 2015-02-04 ENCOUNTER — Ambulatory Visit (HOSPITAL_BASED_OUTPATIENT_CLINIC_OR_DEPARTMENT_OTHER): Payer: Medicare Other | Admitting: Physical Medicine & Rehabilitation

## 2015-02-04 ENCOUNTER — Encounter: Payer: Self-pay | Admitting: Physical Medicine & Rehabilitation

## 2015-02-04 ENCOUNTER — Encounter: Payer: Medicare Other | Attending: Physical Medicine & Rehabilitation

## 2015-02-04 VITALS — BP 125/25 | HR 44 | Resp 16

## 2015-02-04 DIAGNOSIS — M533 Sacrococcygeal disorders, not elsewhere classified: Secondary | ICD-10-CM | POA: Diagnosis not present

## 2015-02-04 DIAGNOSIS — R208 Other disturbances of skin sensation: Secondary | ICD-10-CM | POA: Insufficient documentation

## 2015-02-04 DIAGNOSIS — R269 Unspecified abnormalities of gait and mobility: Secondary | ICD-10-CM | POA: Diagnosis not present

## 2015-02-04 DIAGNOSIS — G8190 Hemiplegia, unspecified affecting unspecified side: Secondary | ICD-10-CM | POA: Diagnosis not present

## 2015-02-04 DIAGNOSIS — I610 Nontraumatic intracerebral hemorrhage in hemisphere, subcortical: Secondary | ICD-10-CM | POA: Insufficient documentation

## 2015-02-04 DIAGNOSIS — I69398 Other sequelae of cerebral infarction: Secondary | ICD-10-CM | POA: Diagnosis not present

## 2015-02-04 MED ORDER — TRAMADOL HCL 50 MG PO TABS: 50.0000 mg | ORAL_TABLET | Freq: Four times a day (QID) | ORAL | Status: DC | PRN

## 2015-02-04 NOTE — Patient Instructions (Signed)
Sacroiliac injection was performed today. A combination of a naming medicine plus a cortisone medicine was injected. The injection was done under x-ray guidance. This procedure has been performed to help reduce low back and buttocks pain as well as potentially hip pain. The duration of this injection is variable lasting from hours to  Months. It may repeated if needed. 

## 2015-02-04 NOTE — Progress Notes (Signed)

## 2015-02-04 NOTE — Progress Notes (Signed)
  PROCEDURE RECORD [facility]   Name: [name] DOB:[birth year] MRN: [id number]  Date:02/04/2015  Physician: Alysia Penna, MD    Nurse/CMA: Mancel Parsons  Allergies: No Known Allergies  Consent Signed: Yes.    Is patient diabetic? No.  CBG today?   Pregnant: No. LMP: No LMP for male patient. (age 65-55)  Anticoagulants: no Anti-inflammatory: no Antibiotics: no  Procedure: right sacroiliac steroid injection  Position: Prone Start Time: 12:15am  End Time: 12:19pm  Fluoro Time: 7  RN/CMA Rolan Bucco Nikash Mortensen    Time 11:45 am 12:20pm    BP 125/50 124/33    Pulse 44 50    Respirations 14 14    O2 Sat 97 96    S/S 6 6    Pain Level 4/10 1/10     D/C home with brother, patient A & O X 3, D/C instructions reviewed, and sits independently.

## 2015-02-23 ENCOUNTER — Other Ambulatory Visit: Payer: Self-pay | Admitting: Cardiovascular Disease

## 2015-02-24 DIAGNOSIS — D500 Iron deficiency anemia secondary to blood loss (chronic): Secondary | ICD-10-CM | POA: Diagnosis not present

## 2015-03-01 DIAGNOSIS — E782 Mixed hyperlipidemia: Secondary | ICD-10-CM | POA: Diagnosis not present

## 2015-03-01 DIAGNOSIS — D500 Iron deficiency anemia secondary to blood loss (chronic): Secondary | ICD-10-CM | POA: Diagnosis not present

## 2015-03-01 DIAGNOSIS — I10 Essential (primary) hypertension: Secondary | ICD-10-CM | POA: Diagnosis not present

## 2015-03-09 ENCOUNTER — Other Ambulatory Visit: Payer: Self-pay | Admitting: Cardiovascular Disease

## 2015-03-09 ENCOUNTER — Other Ambulatory Visit (HOSPITAL_COMMUNITY): Payer: Self-pay | Admitting: Internal Medicine

## 2015-03-11 NOTE — Telephone Encounter (Signed)
Please advise 

## 2015-03-15 ENCOUNTER — Other Ambulatory Visit: Payer: Self-pay | Admitting: Cardiovascular Disease

## 2015-03-15 DIAGNOSIS — I6523 Occlusion and stenosis of bilateral carotid arteries: Secondary | ICD-10-CM

## 2015-03-16 ENCOUNTER — Other Ambulatory Visit: Payer: Self-pay | Admitting: Cardiovascular Disease

## 2015-03-16 ENCOUNTER — Other Ambulatory Visit (HOSPITAL_COMMUNITY): Payer: Self-pay | Admitting: *Deleted

## 2015-03-16 MED ORDER — ATORVASTATIN CALCIUM 80 MG PO TABS: ORAL_TABLET | ORAL | Status: DC

## 2015-03-29 DIAGNOSIS — H43393 Other vitreous opacities, bilateral: Secondary | ICD-10-CM | POA: Diagnosis not present

## 2015-03-29 DIAGNOSIS — H40033 Anatomical narrow angle, bilateral: Secondary | ICD-10-CM | POA: Diagnosis not present

## 2015-03-30 ENCOUNTER — Ambulatory Visit (HOSPITAL_COMMUNITY)
Admission: RE | Admit: 2015-03-30 | Discharge: 2015-03-30 | Disposition: A | Discharge status: Home / Self Care | Payer: Medicare Other | Source: Ambulatory Visit | Attending: Cardiology | Admitting: Cardiology

## 2015-03-30 DIAGNOSIS — E785 Hyperlipidemia, unspecified: Secondary | ICD-10-CM | POA: Insufficient documentation

## 2015-03-30 DIAGNOSIS — I6523 Occlusion and stenosis of bilateral carotid arteries: Secondary | ICD-10-CM | POA: Diagnosis not present

## 2015-03-30 DIAGNOSIS — I10 Essential (primary) hypertension: Secondary | ICD-10-CM | POA: Diagnosis not present

## 2015-03-30 DIAGNOSIS — F17200 Nicotine dependence, unspecified, uncomplicated: Secondary | ICD-10-CM | POA: Insufficient documentation

## 2015-04-07 ENCOUNTER — Other Ambulatory Visit (HOSPITAL_COMMUNITY): Payer: Self-pay | Admitting: Internal Medicine

## 2015-05-05 ENCOUNTER — Encounter: Payer: Self-pay | Admitting: Neurology

## 2015-05-05 ENCOUNTER — Ambulatory Visit (INDEPENDENT_AMBULATORY_CARE_PROVIDER_SITE_OTHER): Payer: Medicare Other | Admitting: Neurology

## 2015-05-05 VITALS — BP 117/42 | HR 45 | Ht 70.0 in | Wt 154.8 lb

## 2015-05-05 DIAGNOSIS — Z7901 Long term (current) use of anticoagulants: Secondary | ICD-10-CM | POA: Diagnosis not present

## 2015-05-05 DIAGNOSIS — I480 Paroxysmal atrial fibrillation: Secondary | ICD-10-CM

## 2015-05-05 DIAGNOSIS — I610 Nontraumatic intracerebral hemorrhage in hemisphere, subcortical: Secondary | ICD-10-CM

## 2015-05-05 DIAGNOSIS — I10 Essential (primary) hypertension: Secondary | ICD-10-CM

## 2015-05-05 NOTE — Progress Notes (Signed)
STROKE NEUROLOGY FOLLOW UP NOTE  NAME: MCKENNA MELLISH DOB: 04/23/1951  REASON FOR VISIT: stroke follow up HISTORY FROM: chart and pt  Today we had the pleasure of seeing CALIPH QUINTO in follow-up at our Neurology Clinic. Pt was accompanied by brother.   History Summary MANCE BRANSCOMB is an 65 y.o. male who has known Afib on Eliquis was admitted on 04/07/14 for suddenly felt dizzy, left facial droop and left sided weakness, arm>leg. CT head showed a right IC basal ganglia hemorrhage with ventricular extension. BP 158/85. s/p Kcentra. Repeat CT brain on 04/08/14 Right thalamic intraparenchymal hematoma stable and without hydrocephalus. CT brain on 04/10/14 of brain revealed Stable intra-axial hemorrhage centered at the right thalamus with small volume intraventricular extension of blood and no ventriculomegaly. Patient continues to have left upper and lower extremity weakness but has improved. Pt was discharged to rehab.   07/01/14 follow up - the patient has been doing better. He stayed in rehab for 3 weeks and then sent to SNF. On 05/03/14 he had repeat CT showed resolution of the ICH and ASA was started. His left LE strength almost back to baseline and he can walk with walker. LUE more weak at the shoulder. He is going to be discharged home next week. He follows with Dr. Haroldine Laws at cardiology next week.   11/03/14 follow up - pt has been doing well. Resumed eliquis in 06/2014 after discussion with Dr. Haroldine Laws, tolerating well. He had left eye cataract on 10/12/14 and will have left eye surgery next Monday. He recently got cortisol injection to his right hip for pain management, stopped Eliquis for 3 days. Currently resumed. Otherwise no complains. BP 113/40 in clinic today.  Interval History During the interval time, pt has been doing well. Continued on eliquis without bleeding side effects. BP controlled well, today 117/42. No PT OT currently due to insurance coverage. Had recent  follow up with Dr. Alvester Chou, had CUS showed stable right ICA 40-59% stenosis and stable left 60-79% stenosis. Will need follow up in one year. He has TTE scheduled next month.   REVIEW OF SYSTEMS: Full 14 system review of systems performed and notable only for those listed below and in HPI above, all others are negative:  Constitutional:   Cardiovascular:  Ear/Nose/Throat:   Skin:  Eyes:   Respiratory:   Gastroitestinal:   Genitourinary:  Hematology/Lymphatic:   Endocrine:  Musculoskeletal:   Allergy/Immunology:   Neurological:   Psychiatric:  Sleep:   The following represents the patient's updated allergies and side effects list: No Known Allergies  The neurologically relevant items on the patient's problem list were reviewed on today's visit.  Neurologic Examination  A problem focused neurological exam (12 or more points of the single system neurologic examination, vital signs counts as 1 point, cranial nerves count for 8 points) was performed.  Blood pressure 117/42, pulse 45, height 5\' 10"  (1.778 m), weight 154 lb 12.8 oz (70.217 kg).  General - Well nourished, well developed, in no apparent distress.  Ophthalmologic - Sharp disc margins OU.  Cardiovascular - Regular rate and rhythm with no murmur.  Mental Status -  Level of arousal and orientation to time, place, and person were intact. Language including expression, naming, repetition, comprehension, reading, and writing was assessed and found intact. Fund of Knowledge was assessed and was intact.  Cranial Nerves II - XII - II - Visual field intact OU. III, IV, VI - Extraocular movements intact. V - Facial sensation symmetrical.  VII - Facial movement intact bilaterally. VIII - Hearing & vestibular intact bilaterally. X - Palate elevates symmetrically. XI - Chin turning & shoulder shrug intact bilaterally. XII - Tongue protrusion intact.  Motor Strength - The patient's strength was normal in all extremities except  left hand mild dexterity difficulty and pronator drift was absent.  Bulk was normal and fasciculations were absent.   Motor Tone - Muscle tone was assessed at the neck and appendages and was normal.  Reflexes - The patient's reflexes were normal in all extremities and he had no pathological reflexes.  Sensory - Light touch, temperature/pinprick were assessed and were symmetrical.   Coordination - The patient had ataxia on FTN at left hand which is out of proportion to the weakness.  Tremor was absent.  Gait and Station - walk with walker, stable, no fall.  Data reviewed: I personally reviewed the images and agree with the radiology interpretations.  Ct Head Wo Contrast 04/10/2014  1. Stable intra-axial hemorrhage centered at the right thalamus with small volume intraventricular extension of blood.  2. Stable mild regional mass effect. No ventriculomegaly.  3. No new intracranial abnormality.   04/07/14:  1. Acute posterior RIGHT basal ganglia and internal capsule hemorrhage measuring 15 mm x 23 mm. 2. 4 mm of RIGHT to LEFT midline shift. 3. Area of low attenuation in the posterior LEFT temporal lobe may represent subacute ischemia but the appearance is more suggestive of either an old infarct or asymmetric chronic ischemic white matter disease.  04/08/14: Stable exams inch yesterday. Right thalamic intraparenchymal hematoma again measured at 15 x 24 mm. Mild surrounding edema. Small amount of intraventricular hemorrhage, not increased and without hydrocephalus.  CUS - - The vertebral arteries appear patent with antegrade flow. - Findings consistent with 64 - 79 percent stenosis involving the right internal carotid artery. - Findings consistent with 60 - 79 percent stenosis involving the left internal carotid artery.  EKG Ectopic atrial tachycardia, unifocal, Nonspecific ST and T wave abnormality  Assessment: As you may recall, he is a 65 y.o. Caucasian male  with PMH of known Afib on Eliquis was admitted on 04/07/14 for right BG hemorrhage with ventricular extension. BP 158/85. s/p Kcentra. Repeat CT showed stable hematoma without hydrocephalus. Patient continued to have left upper and lower extremity weakness but has improved. Pt was discharged to rehab. In 04/2014 repeat CT showed resolution of hemorrhage and ASA started. After discuss with his cardiologist Dr. Haroldine Laws, resumed eliquis in 06/2014. So far pt tolerating the medication well. Following up with Dr. Alvester Chou for annual follow up of b/l ICA stenosis.  Plan:  - continue eliquis and lipitor for stroke prevention. - check BP at home. BP goal < 130 to avoid hemorrhage while on anticoagulation - continue follow up with cardiology - Follow up with your primary care physician for stroke risk factor modification. Recommend maintain blood pressure goal <130/80, diabetes with hemoglobin A1c goal below 6.5% and lipids with LDL cholesterol goal below 70 mg/dL.  - follow up in 6 months.  I spent more than 25 minutes of face to face time with the patient. Greater than 50% of time was spent in counseling and coordination of care.    Orders Placed This Encounter  Procedures  . Ambulatory referral to Physical Therapy    Referral Priority:  Routine    Referral Type:  Physical Medicine    Referral Reason:  Specialty Services Required    Requested Specialty:  Physical Therapy    Number  of Visits Requested:  1  . Ambulatory referral to Occupational Therapy    Referral Priority:  Routine    Referral Type:  Occupational Therapy    Referral Reason:  Specialty Services Required    Requested Specialty:  Occupational Therapy    Number of Visits Requested:  1    Meds ordered this encounter  Medications  . Fe Fum-FePoly-Vit C-Vit B3 (INTEGRA) 62.5-62.5-40-3 MG CAPS    Sig: Take 1 capsule by mouth daily.    Refill:  4    Patient Instructions  - continue eliquis and lipitor for stroke prevention. -  check BP at home. BP goal < 130 to avoid bleeding while you are on blood thinners - continue follow up with cardiology - Follow up with your primary care physician for stroke risk factor modification. Recommend maintain blood pressure goal <130/80, diabetes with hemoglobin A1c goal below 6.5% and lipids with LDL cholesterol goal below 70 mg/dL.  - will order PT OT for left arm leg strengthening. - follow up in 6 months.    Rosalin Hawking, MD PhD [facility] [address] [place], [place] [address] [phone]

## 2015-05-05 NOTE — Patient Instructions (Signed)
-   continue eliquis and lipitor for stroke prevention. - check BP at home. BP goal < 130 to avoid bleeding while you are on blood thinners - continue follow up with cardiology - Follow up with your primary care physician for stroke risk factor modification. Recommend maintain blood pressure goal <130/80, diabetes with hemoglobin A1c goal below 6.5% and lipids with LDL cholesterol goal below 70 mg/dL.  - will order PT OT for left arm leg strengthening. - follow up in 6 months.

## 2015-05-10 DIAGNOSIS — D500 Iron deficiency anemia secondary to blood loss (chronic): Secondary | ICD-10-CM | POA: Diagnosis not present

## 2015-05-11 ENCOUNTER — Ambulatory Visit: Payer: Medicare Other | Admitting: Physical Medicine & Rehabilitation

## 2015-05-12 ENCOUNTER — Ambulatory Visit: Payer: Medicare Other | Admitting: Physical Therapy

## 2015-05-12 ENCOUNTER — Ambulatory Visit: Payer: Medicare Other | Admitting: Occupational Therapy

## 2015-05-17 DIAGNOSIS — I10 Essential (primary) hypertension: Secondary | ICD-10-CM | POA: Diagnosis not present

## 2015-05-17 DIAGNOSIS — E782 Mixed hyperlipidemia: Secondary | ICD-10-CM | POA: Diagnosis not present

## 2015-05-17 DIAGNOSIS — L03119 Cellulitis of unspecified part of limb: Secondary | ICD-10-CM | POA: Diagnosis not present

## 2015-05-17 DIAGNOSIS — D500 Iron deficiency anemia secondary to blood loss (chronic): Secondary | ICD-10-CM | POA: Diagnosis not present

## 2015-05-20 ENCOUNTER — Ambulatory Visit: Payer: Medicare Other

## 2015-05-20 ENCOUNTER — Ambulatory Visit: Payer: Medicare Other | Admitting: Physical Medicine & Rehabilitation

## 2015-05-25 ENCOUNTER — Encounter: Payer: Medicare Other | Attending: Physical Medicine & Rehabilitation

## 2015-05-25 ENCOUNTER — Encounter: Payer: Self-pay | Admitting: Physical Medicine & Rehabilitation

## 2015-05-25 ENCOUNTER — Ambulatory Visit (HOSPITAL_BASED_OUTPATIENT_CLINIC_OR_DEPARTMENT_OTHER): Payer: Medicare Other | Admitting: Physical Medicine & Rehabilitation

## 2015-05-25 VITALS — BP 133/73 | HR 52

## 2015-05-25 DIAGNOSIS — M533 Sacrococcygeal disorders, not elsewhere classified: Secondary | ICD-10-CM | POA: Diagnosis not present

## 2015-05-25 NOTE — Progress Notes (Signed)
  PROCEDURE RECORD [facility]   Name: [name] DOB:02-11-1951 MRN: [id number]  Date:05/25/2015  Physician: Alysia Penna, MD    Nurse/CMA: Shumaker RN  Allergies: No Known Allergies  Consent Signed: Yes.    Is patient diabetic? No.  CBG today?  Pregnant: No. LMP: No LMP for male patient. (age 66-55)  Anticoagulants: yes (off since 05/20/15 for procedure) Anti-inflammatory: no Antibiotics: yes (septra for leg wound)  Procedure: right sacroiliac steroid injection Position: Prone Start Time: 1:24 End Time:1:26 Fluoro Time:8 sec  RN/CMA  Development worker, international aid    Time 12:43 1:30    BP 133/73 130/42    Pulse 52 52    Respirations 14 14    O2 Sat 96 96    S/S 6 6    Pain Level 6/10      D/C home with brother, patient A & O X 3, D/C instructions reviewed, and sits independently.

## 2015-05-25 NOTE — Patient Instructions (Signed)
Sacroiliac injection was performed today. A combination of a naming medicine plus a cortisone medicine was injected. The injection was done under x-ray guidance. This procedure has been performed to help reduce low back and buttocks pain as well as potentially hip pain. The duration of this injection is variable lasting from hours to  Months. It may repeated if needed. 

## 2015-06-10 ENCOUNTER — Encounter (HOSPITAL_COMMUNITY): Payer: Self-pay | Admitting: Internal Medicine

## 2015-06-10 ENCOUNTER — Ambulatory Visit (HOSPITAL_COMMUNITY)
Admission: RE | Admit: 2015-06-10 | Discharge: 2015-06-10 | Disposition: A | Discharge status: Home / Self Care | Payer: Medicare Other | Source: Ambulatory Visit | Attending: Internal Medicine | Admitting: Internal Medicine

## 2015-06-10 ENCOUNTER — Ambulatory Visit (HOSPITAL_BASED_OUTPATIENT_CLINIC_OR_DEPARTMENT_OTHER)
Admission: RE | Admit: 2015-06-10 | Discharge: 2015-06-10 | Disposition: A | Discharge status: Home / Self Care | Payer: Medicare Other | Source: Ambulatory Visit | Attending: Internal Medicine | Admitting: Internal Medicine

## 2015-06-10 VITALS — BP 110/58 | HR 46 | Wt 152.0 lb

## 2015-06-10 DIAGNOSIS — I350 Nonrheumatic aortic (valve) stenosis: Secondary | ICD-10-CM | POA: Diagnosis not present

## 2015-06-10 DIAGNOSIS — I2510 Atherosclerotic heart disease of native coronary artery without angina pectoris: Secondary | ICD-10-CM | POA: Diagnosis not present

## 2015-06-10 DIAGNOSIS — I5041 Acute combined systolic (congestive) and diastolic (congestive) heart failure: Secondary | ICD-10-CM | POA: Insufficient documentation

## 2015-06-10 DIAGNOSIS — I480 Paroxysmal atrial fibrillation: Secondary | ICD-10-CM | POA: Diagnosis not present

## 2015-06-10 DIAGNOSIS — Z951 Presence of aortocoronary bypass graft: Secondary | ICD-10-CM | POA: Insufficient documentation

## 2015-06-10 DIAGNOSIS — I509 Heart failure, unspecified: Secondary | ICD-10-CM

## 2015-06-10 DIAGNOSIS — J449 Chronic obstructive pulmonary disease, unspecified: Secondary | ICD-10-CM | POA: Diagnosis not present

## 2015-06-10 DIAGNOSIS — I10 Essential (primary) hypertension: Secondary | ICD-10-CM | POA: Insufficient documentation

## 2015-06-10 DIAGNOSIS — Z87891 Personal history of nicotine dependence: Secondary | ICD-10-CM | POA: Diagnosis not present

## 2015-06-10 DIAGNOSIS — Z952 Presence of prosthetic heart valve: Secondary | ICD-10-CM | POA: Diagnosis not present

## 2015-06-10 DIAGNOSIS — M109 Gout, unspecified: Secondary | ICD-10-CM | POA: Insufficient documentation

## 2015-06-10 DIAGNOSIS — Z7901 Long term (current) use of anticoagulants: Secondary | ICD-10-CM | POA: Diagnosis not present

## 2015-06-10 DIAGNOSIS — F1010 Alcohol abuse, uncomplicated: Secondary | ICD-10-CM | POA: Diagnosis not present

## 2015-06-10 DIAGNOSIS — I5042 Chronic combined systolic (congestive) and diastolic (congestive) heart failure: Secondary | ICD-10-CM

## 2015-06-10 DIAGNOSIS — Z9981 Dependence on supplemental oxygen: Secondary | ICD-10-CM | POA: Insufficient documentation

## 2015-06-10 DIAGNOSIS — E785 Hyperlipidemia, unspecified: Secondary | ICD-10-CM | POA: Diagnosis not present

## 2015-06-10 DIAGNOSIS — Z8673 Personal history of transient ischemic attack (TIA), and cerebral infarction without residual deficits: Secondary | ICD-10-CM | POA: Insufficient documentation

## 2015-06-10 DIAGNOSIS — I50810 Right heart failure, unspecified: Secondary | ICD-10-CM

## 2015-06-10 MED ORDER — CARVEDILOL 25 MG PO TABS: 12.5000 mg | ORAL_TABLET | Freq: Two times a day (BID) | ORAL | Status: AC

## 2015-06-10 NOTE — Progress Notes (Signed)
  Echocardiogram 2D Echocardiogram has been performed.  Donata Clay 06/10/2015, 11:01 AM

## 2015-06-10 NOTE — Patient Instructions (Addendum)
Follow up with Dr.Bensimhon in 6 months.   Decrease Carvedilol to 12.5mg  (1/2 tablet)  twice daily.

## 2015-06-10 NOTE — Progress Notes (Signed)
ADVANCED HF CLINIC NOTE  Patient ID: [name], male   DOB: Dec 14, 1950, 65 y.o.   MRN: [id number] EP: Dr [name]   HPI: [name] is 65 y.o. male with past medical history of severe aortic stenosis s/p tissue AV replacement (09/2012) atrial fibrillation s/p MAZE (09/2012)  chronic combined systolic/diastolic HF, RV failure, HTN, PAD s/p L iliac stent in 2010 and aortobifemoral bypass graft, ETOH abuse, severe COPD with cor pulmonale on home oxygen, CAD s/p CABG with AV replacement and back pain from spinal stenosis.   Admitted and treated for acute failure complicated by listeria bacteremia and Afib RVR 09/2013. Diuresed with IV lasix transitioned to po lasix.He had TEE DC-CV but went back into afib with controlled rate on amiodarone and eliquis. He was treated with 2 weeks of antibiotics. Discharge weight was 226 pounds.  Amiodarone was stopped as patient did not hold NSR.    Patient was admitted to [facility] in 12/15 with right basal ganglia intracerebral hemorrhage and left hemiparesis.  He had been on Eliquis.  He eventually was sent to inpatient rehab, where due to a combination of ongoing diuretic use and poor po intake, he developed AKI and hyponatremia.  Diuretics and ACEI were held, and creatinine returned to baseline.    He returns for follow up.  Feels ok. Has not taken any torsemide. Weight now 154. Stable over the past 3-4 months. Denies SOB/PND/Orthopnea/edema. Appetite imprvoed. No bleeding problems. Taking all medication. Still can't walk so using WC.  ECHO today EF 50-55% bioprosthetic aortic valve ok very mild gradient RV normal size. Mild HK   ECHO 30-35% 08/2012  ECHO 08/2013 EF 50-55% Grade II DD  TEE 6/15 EF 40-45%, bioprosthetic aortic valve ok   PFTs 9/14 FEV1 1.1L (34%) FVC 2.57L (57%) FEF 25-75% (17%) DLCO 41%  Labs (9/15): LDL 106 Labs (05/05/14): K 3.4, creatinine 1.34, digoxin 0.4 Labs 06/10/2014: K 5.6 Creatinine 2.7 BNP 215 Labs 06/19/2014: K 4.9 Creatinine 2.28  K and spiro stopped.  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 Drink alcohol occasionally . Denies drug use. Lives alone   FH: Father died MI at 46 Brother MI   ROS: All systems negative except as listed in HPI, PMH and Problem List.  Past Medical History  Diagnosis Date  . Hypertension   . Gout   . Aortic stenosis     echo 06/19/08 with nomal LV function, moderate concentric LVH moderate aortic stenosis area 0.99 cm squared, peak gradient of 50 and mean of 31  . Hyperlipidemia   . Atrial fibrillation with RVR, was in atrial fib in 04/2011 09/16/2012    a) s/p MAZE (09/2012)   . Tobacco use 09/16/2012  . Heart murmur   . Chronic combined systolic and diastolic CHF (congestive heart failure) ([place]) 09/16/2012    a) ECHO (08/2013) EF 50-55%, grade II DD b) TEE ECHO (09/2013): 40-45%, bioprosthesis present, mild Dustin  . Aortic stenosis, severe 09/23/2012  . CAD (coronary artery disease), single vessel disease 09/23/2012    a) CABG (SVG to PDA and PL) wtih AVR (09/2012)   . PAD (peripheral artery disease), decreased bil. ABIs 09/23/2012    a) ABIs (11/2013): Right mild arterial insufficiency, L normal; aroto-bifem bypass graft: not well visualized, bilateral SFAs = to greater than 50% in dimaeter reduction   . Gout flare. 09/29/12, Lt knee, improved with colchicine. 09/30/2012  . H/O aortic valve replacement   . AS (aortic  stenosis) with AVR with pericardial tissue valve  09/30/2013  . Back pain, spinal stenosis 09/30/2013  . COPD (chronic obstructive pulmonary disease) ([place])   . Bilateral carotid artery disease ([place])   . Stroke [facility])     Current Outpatient Prescriptions  Medication Sig Dispense Refill  . albuterol (PROVENTIL HFA;VENTOLIN HFA) 108 (90 BASE) MCG/ACT inhaler Inhale 2 puffs into the lungs every 6 (six) hours as needed for wheezing or shortness of breath. 1 Inhaler 2  . amiodarone (PACERONE) 200 MG tablet TAKE 1 TABLET (200 MG TOTAL) BY MOUTH DAILY.  30 tablet 6  . apixaban (ELIQUIS) 5 MG TABS tablet Take 1 tablet (5 mg total) by mouth 2 (two) times daily. 180 tablet 3  . atorvastatin (LIPITOR) 80 MG tablet TAKE 1 TABLET (80 MG TOTAL) BY MOUTH DAILY AT 6 PM. 30 tablet 2  . carvedilol (COREG) 25 MG tablet TAKE 1 TABLET BY MOUTH TWICE A DAY 60 tablet 8  . Fe Fum-FePoly-Vit C-Vit B3 (INTEGRA) 62.5-62.5-40-3 MG CAPS Take 1 capsule by mouth daily.  4  . folic acid (FOLVITE) 1 MG tablet Take 1 tablet (1 mg total) by mouth daily. 30 tablet 6  . lisinopril (PRINIVIL,ZESTRIL) 2.5 MG tablet TAKE 1 TABLET (2.5 MG TOTAL) BY MOUTH DAILY. 30 tablet 6  . OXYGEN-HELIUM IN Inhale 1-2 L into the lungs continuous. Is in the habit of not using his oxygen when he leaves the house. Uses 1L continuous normally Uses 2L for exertion    . pantoprazole (PROTONIX) 40 MG tablet TAKE 1 TABLET (40 MG TOTAL) BY MOUTH DAILY AS NEEDED (INDIGESTION/REFLUX). 30 tablet 3  . PRESCRIPTION MEDICATION Apply 1 application topically daily.    . traMADol (ULTRAM) 50 MG tablet Take 1 tablet (50 mg total) by mouth every 6 (six) hours as needed for moderate pain. 120 tablet 2   No current facility-administered medications for this encounter.   Filed Vitals:   06/10/15 1113  BP: 110/58  Pulse: 46  Weight: 152 lb (68.947 kg)  SpO2: 98%   PHYSICAL EXAM: General:  Sitting in the wheel chair. No resp difficulty. Brother present HEENT: normal Neck: supple. JVP flat  Carotids 2+ bilaterally; no bruits. No lymphadenopathy or thryomegaly appreciated. Cor: PMI normal. Brady rgular, 2/6 SEM RUSB.  Lungs: barrel chested. diminshed throughout fine crackles in bases Abdomen: soft, nontender, nondistended. No hepatosplenomegaly. No bruits or masses. Good bowel sounds. Extremities: no cyanosis, clubbing, rash. No lower extremity edema.    Neuro: alert & orientedx3, cranial nerves grossly intact. Left-sided weakness.   ECG: sinus brady 46  ASSESSMENT & PLAN:  1. Biventricular HF (R>L):  Suspect mixed ischemic/nonischemic cardiomyopathy,  EF 40-45% on 6/15 TEE.  Now 55% by echo today. He has had cor pulmonale with prominent RV failure from COPD. RV also improved on echo today. -Functional status limited due to previous ICH/stroke -Volume status looks good.  -Continue current meds but will reduce carvedilol to 12.5 bid due to bradycardia 2. Severe COPD with cor pulmonale: Using continuous oxygen. Follows with Dr [place] Bells.  3 Chronic A fib: s/p MAZE. Failed DC-CV x3 in November 2014 and June 2015.  However, he was in NSR in 12/15 at time of hospital admission.   - Maintaining NSR on amiodarone 200 mg daily.  He will need yearly eye exam and  PFTs.  - Continue eliquis 5 mg twice a day . No bleeding problems. - He is very bradycardic today. Will cut carvedilol to 12.5 bid. Can cut further as needed.  4. CAD: s/p CABG bioprosthetic AVR.  Off ASA with Eliquis 5. H/o Intracerebral hemorrhage: Still has left-sided weakness. Continue eliquis per neuro.   6. CKD: Check BMET today  RTC 6 months.   Glori Bickers MD 06/10/2015

## 2015-06-12 ENCOUNTER — Other Ambulatory Visit (HOSPITAL_COMMUNITY): Payer: Self-pay | Admitting: Internal Medicine

## 2015-07-02 ENCOUNTER — Other Ambulatory Visit (HOSPITAL_COMMUNITY): Payer: Self-pay | Admitting: Adult Health

## 2015-07-05 DIAGNOSIS — E782 Mixed hyperlipidemia: Secondary | ICD-10-CM | POA: Diagnosis not present

## 2015-07-05 DIAGNOSIS — I2510 Atherosclerotic heart disease of native coronary artery without angina pectoris: Secondary | ICD-10-CM | POA: Diagnosis not present

## 2015-07-05 DIAGNOSIS — D500 Iron deficiency anemia secondary to blood loss (chronic): Secondary | ICD-10-CM | POA: Diagnosis not present

## 2015-07-05 DIAGNOSIS — I10 Essential (primary) hypertension: Secondary | ICD-10-CM | POA: Diagnosis not present

## 2015-07-12 ENCOUNTER — Other Ambulatory Visit: Payer: Self-pay | Admitting: Physical Medicine & Rehabilitation

## 2015-07-16 ENCOUNTER — Other Ambulatory Visit (HOSPITAL_COMMUNITY): Payer: Self-pay

## 2015-07-16 MED ORDER — APIXABAN 5 MG PO TABS: 5.0000 mg | ORAL_TABLET | Freq: Two times a day (BID) | ORAL | Status: DC

## 2015-07-29 ENCOUNTER — Encounter: Payer: Self-pay | Admitting: Physical Medicine & Rehabilitation

## 2015-08-14 DIAGNOSIS — I5022 Chronic systolic (congestive) heart failure: Secondary | ICD-10-CM | POA: Diagnosis not present

## 2015-08-14 DIAGNOSIS — M6281 Muscle weakness (generalized): Secondary | ICD-10-CM | POA: Diagnosis not present

## 2015-08-14 DIAGNOSIS — I69354 Hemiplegia and hemiparesis following cerebral infarction affecting left non-dominant side: Secondary | ICD-10-CM | POA: Diagnosis not present

## 2015-08-14 DIAGNOSIS — J449 Chronic obstructive pulmonary disease, unspecified: Secondary | ICD-10-CM | POA: Diagnosis not present

## 2015-08-14 DIAGNOSIS — I110 Hypertensive heart disease with heart failure: Secondary | ICD-10-CM | POA: Diagnosis not present

## 2015-08-14 DIAGNOSIS — I2510 Atherosclerotic heart disease of native coronary artery without angina pectoris: Secondary | ICD-10-CM | POA: Diagnosis not present

## 2015-08-18 DIAGNOSIS — I69354 Hemiplegia and hemiparesis following cerebral infarction affecting left non-dominant side: Secondary | ICD-10-CM | POA: Diagnosis not present

## 2015-08-18 DIAGNOSIS — M6281 Muscle weakness (generalized): Secondary | ICD-10-CM | POA: Diagnosis not present

## 2015-08-18 DIAGNOSIS — I5022 Chronic systolic (congestive) heart failure: Secondary | ICD-10-CM | POA: Diagnosis not present

## 2015-08-18 DIAGNOSIS — I2510 Atherosclerotic heart disease of native coronary artery without angina pectoris: Secondary | ICD-10-CM | POA: Diagnosis not present

## 2015-08-18 DIAGNOSIS — J449 Chronic obstructive pulmonary disease, unspecified: Secondary | ICD-10-CM | POA: Diagnosis not present

## 2015-08-18 DIAGNOSIS — I110 Hypertensive heart disease with heart failure: Secondary | ICD-10-CM | POA: Diagnosis not present

## 2015-08-20 DIAGNOSIS — I110 Hypertensive heart disease with heart failure: Secondary | ICD-10-CM | POA: Diagnosis not present

## 2015-08-20 DIAGNOSIS — I69354 Hemiplegia and hemiparesis following cerebral infarction affecting left non-dominant side: Secondary | ICD-10-CM | POA: Diagnosis not present

## 2015-08-20 DIAGNOSIS — I2510 Atherosclerotic heart disease of native coronary artery without angina pectoris: Secondary | ICD-10-CM | POA: Diagnosis not present

## 2015-08-20 DIAGNOSIS — J449 Chronic obstructive pulmonary disease, unspecified: Secondary | ICD-10-CM | POA: Diagnosis not present

## 2015-08-20 DIAGNOSIS — M6281 Muscle weakness (generalized): Secondary | ICD-10-CM | POA: Diagnosis not present

## 2015-08-20 DIAGNOSIS — I5022 Chronic systolic (congestive) heart failure: Secondary | ICD-10-CM | POA: Diagnosis not present

## 2015-08-23 ENCOUNTER — Ambulatory Visit (HOSPITAL_BASED_OUTPATIENT_CLINIC_OR_DEPARTMENT_OTHER): Payer: Medicare Other | Admitting: Physical Medicine & Rehabilitation

## 2015-08-23 ENCOUNTER — Encounter: Payer: Medicare Other | Attending: Physical Medicine & Rehabilitation

## 2015-08-23 ENCOUNTER — Encounter: Payer: Self-pay | Admitting: Physical Medicine & Rehabilitation

## 2015-08-23 VITALS — BP 140/34 | HR 48

## 2015-08-23 DIAGNOSIS — M545 Low back pain: Secondary | ICD-10-CM | POA: Diagnosis not present

## 2015-08-23 DIAGNOSIS — G8929 Other chronic pain: Secondary | ICD-10-CM | POA: Diagnosis not present

## 2015-08-23 DIAGNOSIS — M533 Sacrococcygeal disorders, not elsewhere classified: Secondary | ICD-10-CM | POA: Insufficient documentation

## 2015-08-23 MED ORDER — TRAMADOL HCL 50 MG PO TABS: 50.0000 mg | ORAL_TABLET | Freq: Four times a day (QID) | ORAL | Status: DC

## 2015-08-23 NOTE — Patient Instructions (Addendum)
Sacroiliac injection was performed today. A combination of a naming medicine plus a cortisone medicine was injected. The injection was done under x-ray guidance. This procedure has been performed to help reduce low back and buttocks pain as well as potentially hip pain. The duration of this injection is variable lasting from hours to  Months. It may repeated if needed.  May resume Eliquis today

## 2015-08-23 NOTE — Progress Notes (Signed)
  PROCEDURE RECORD [facility]   Name: [name] DOB:1951/01/21 MRN: [id number]  Date:08/23/2015  Physician: Alysia Penna, MD    Nurse/CMA: Edison Nicholson RN  Allergies: No Known Allergies  Consent Signed: Yes.    Is patient diabetic? No.  CBG today?  Pregnant: No. LMP: No LMP for male patient. (age 66-55)  Anticoagulants: no  Stopped Eliquis 08/19/15 Anti-inflammatory: no Antibiotics: no  Procedure:sacroiliac steroid injection Position: Prone   Start Time: 11:31 End Time: 11:33 Fluoro Time:8 sec  RN/CMA Biomedical engineer    Time 11:07 11:40    BP 140/34 135/49    Pulse 48 52    Respirations 16 14    O2 Sat 96 98    S/S 6 6    Pain Level 4/10 0/10     D/C home with brother, patient A & O X 3, D/C instructions reviewed, and sits independently.

## 2015-08-23 NOTE — Progress Notes (Signed)

## 2015-08-24 ENCOUNTER — Telehealth (HOSPITAL_COMMUNITY): Payer: Self-pay | Admitting: Pharmacist

## 2015-08-24 NOTE — Telephone Encounter (Signed)
[employer] approved Eliquis patient assistance through 08/19/16. Supply will be mailed to patient's home at no charge to him.   Ruta Hinds. Velva Harman, PharmD, BCPS, CPP Clinical Pharmacist Pager: [phone] Phone: [phone] 08/24/2015 10:35 AM

## 2015-08-25 DIAGNOSIS — I69354 Hemiplegia and hemiparesis following cerebral infarction affecting left non-dominant side: Secondary | ICD-10-CM | POA: Diagnosis not present

## 2015-08-25 DIAGNOSIS — I110 Hypertensive heart disease with heart failure: Secondary | ICD-10-CM | POA: Diagnosis not present

## 2015-08-25 DIAGNOSIS — I5022 Chronic systolic (congestive) heart failure: Secondary | ICD-10-CM | POA: Diagnosis not present

## 2015-08-25 DIAGNOSIS — M6281 Muscle weakness (generalized): Secondary | ICD-10-CM | POA: Diagnosis not present

## 2015-08-25 DIAGNOSIS — J449 Chronic obstructive pulmonary disease, unspecified: Secondary | ICD-10-CM | POA: Diagnosis not present

## 2015-08-25 DIAGNOSIS — I2510 Atherosclerotic heart disease of native coronary artery without angina pectoris: Secondary | ICD-10-CM | POA: Diagnosis not present

## 2015-08-27 DIAGNOSIS — I2510 Atherosclerotic heart disease of native coronary artery without angina pectoris: Secondary | ICD-10-CM | POA: Diagnosis not present

## 2015-08-27 DIAGNOSIS — I110 Hypertensive heart disease with heart failure: Secondary | ICD-10-CM | POA: Diagnosis not present

## 2015-08-27 DIAGNOSIS — I5022 Chronic systolic (congestive) heart failure: Secondary | ICD-10-CM | POA: Diagnosis not present

## 2015-08-27 DIAGNOSIS — J449 Chronic obstructive pulmonary disease, unspecified: Secondary | ICD-10-CM | POA: Diagnosis not present

## 2015-08-27 DIAGNOSIS — M6281 Muscle weakness (generalized): Secondary | ICD-10-CM | POA: Diagnosis not present

## 2015-08-27 DIAGNOSIS — I69354 Hemiplegia and hemiparesis following cerebral infarction affecting left non-dominant side: Secondary | ICD-10-CM | POA: Diagnosis not present

## 2015-08-31 DIAGNOSIS — I110 Hypertensive heart disease with heart failure: Secondary | ICD-10-CM | POA: Diagnosis not present

## 2015-08-31 DIAGNOSIS — M6281 Muscle weakness (generalized): Secondary | ICD-10-CM | POA: Diagnosis not present

## 2015-08-31 DIAGNOSIS — I2510 Atherosclerotic heart disease of native coronary artery without angina pectoris: Secondary | ICD-10-CM | POA: Diagnosis not present

## 2015-08-31 DIAGNOSIS — J449 Chronic obstructive pulmonary disease, unspecified: Secondary | ICD-10-CM | POA: Diagnosis not present

## 2015-08-31 DIAGNOSIS — I69354 Hemiplegia and hemiparesis following cerebral infarction affecting left non-dominant side: Secondary | ICD-10-CM | POA: Diagnosis not present

## 2015-08-31 DIAGNOSIS — I5022 Chronic systolic (congestive) heart failure: Secondary | ICD-10-CM | POA: Diagnosis not present

## 2015-09-03 DIAGNOSIS — I69354 Hemiplegia and hemiparesis following cerebral infarction affecting left non-dominant side: Secondary | ICD-10-CM | POA: Diagnosis not present

## 2015-09-03 DIAGNOSIS — I2510 Atherosclerotic heart disease of native coronary artery without angina pectoris: Secondary | ICD-10-CM | POA: Diagnosis not present

## 2015-09-03 DIAGNOSIS — M6281 Muscle weakness (generalized): Secondary | ICD-10-CM | POA: Diagnosis not present

## 2015-09-03 DIAGNOSIS — I110 Hypertensive heart disease with heart failure: Secondary | ICD-10-CM | POA: Diagnosis not present

## 2015-09-03 DIAGNOSIS — J449 Chronic obstructive pulmonary disease, unspecified: Secondary | ICD-10-CM | POA: Diagnosis not present

## 2015-09-03 DIAGNOSIS — I5022 Chronic systolic (congestive) heart failure: Secondary | ICD-10-CM | POA: Diagnosis not present

## 2015-09-06 DIAGNOSIS — I5022 Chronic systolic (congestive) heart failure: Secondary | ICD-10-CM | POA: Diagnosis not present

## 2015-09-06 DIAGNOSIS — M6281 Muscle weakness (generalized): Secondary | ICD-10-CM | POA: Diagnosis not present

## 2015-09-06 DIAGNOSIS — J449 Chronic obstructive pulmonary disease, unspecified: Secondary | ICD-10-CM | POA: Diagnosis not present

## 2015-09-06 DIAGNOSIS — I110 Hypertensive heart disease with heart failure: Secondary | ICD-10-CM | POA: Diagnosis not present

## 2015-09-06 DIAGNOSIS — I69354 Hemiplegia and hemiparesis following cerebral infarction affecting left non-dominant side: Secondary | ICD-10-CM | POA: Diagnosis not present

## 2015-09-06 DIAGNOSIS — I2510 Atherosclerotic heart disease of native coronary artery without angina pectoris: Secondary | ICD-10-CM | POA: Diagnosis not present

## 2015-09-08 ENCOUNTER — Other Ambulatory Visit (HOSPITAL_COMMUNITY): Payer: Self-pay | Admitting: Internal Medicine

## 2015-09-08 DIAGNOSIS — I69354 Hemiplegia and hemiparesis following cerebral infarction affecting left non-dominant side: Secondary | ICD-10-CM | POA: Diagnosis not present

## 2015-09-08 DIAGNOSIS — J449 Chronic obstructive pulmonary disease, unspecified: Secondary | ICD-10-CM | POA: Diagnosis not present

## 2015-09-08 DIAGNOSIS — M6281 Muscle weakness (generalized): Secondary | ICD-10-CM | POA: Diagnosis not present

## 2015-09-08 DIAGNOSIS — I10 Essential (primary) hypertension: Secondary | ICD-10-CM | POA: Diagnosis not present

## 2015-09-09 DIAGNOSIS — I5022 Chronic systolic (congestive) heart failure: Secondary | ICD-10-CM | POA: Diagnosis not present

## 2015-09-09 DIAGNOSIS — I69354 Hemiplegia and hemiparesis following cerebral infarction affecting left non-dominant side: Secondary | ICD-10-CM | POA: Diagnosis not present

## 2015-09-09 DIAGNOSIS — I110 Hypertensive heart disease with heart failure: Secondary | ICD-10-CM | POA: Diagnosis not present

## 2015-09-09 DIAGNOSIS — M6281 Muscle weakness (generalized): Secondary | ICD-10-CM | POA: Diagnosis not present

## 2015-09-09 DIAGNOSIS — I2510 Atherosclerotic heart disease of native coronary artery without angina pectoris: Secondary | ICD-10-CM | POA: Diagnosis not present

## 2015-09-09 DIAGNOSIS — J449 Chronic obstructive pulmonary disease, unspecified: Secondary | ICD-10-CM | POA: Diagnosis not present

## 2015-09-14 DIAGNOSIS — I110 Hypertensive heart disease with heart failure: Secondary | ICD-10-CM | POA: Diagnosis not present

## 2015-09-14 DIAGNOSIS — M6281 Muscle weakness (generalized): Secondary | ICD-10-CM | POA: Diagnosis not present

## 2015-09-14 DIAGNOSIS — J449 Chronic obstructive pulmonary disease, unspecified: Secondary | ICD-10-CM | POA: Diagnosis not present

## 2015-09-14 DIAGNOSIS — I69354 Hemiplegia and hemiparesis following cerebral infarction affecting left non-dominant side: Secondary | ICD-10-CM | POA: Diagnosis not present

## 2015-09-14 DIAGNOSIS — I5022 Chronic systolic (congestive) heart failure: Secondary | ICD-10-CM | POA: Diagnosis not present

## 2015-09-14 DIAGNOSIS — I2510 Atherosclerotic heart disease of native coronary artery without angina pectoris: Secondary | ICD-10-CM | POA: Diagnosis not present

## 2015-09-16 DIAGNOSIS — I2510 Atherosclerotic heart disease of native coronary artery without angina pectoris: Secondary | ICD-10-CM | POA: Diagnosis not present

## 2015-09-16 DIAGNOSIS — M6281 Muscle weakness (generalized): Secondary | ICD-10-CM | POA: Diagnosis not present

## 2015-09-16 DIAGNOSIS — I5022 Chronic systolic (congestive) heart failure: Secondary | ICD-10-CM | POA: Diagnosis not present

## 2015-09-16 DIAGNOSIS — I110 Hypertensive heart disease with heart failure: Secondary | ICD-10-CM | POA: Diagnosis not present

## 2015-09-16 DIAGNOSIS — J449 Chronic obstructive pulmonary disease, unspecified: Secondary | ICD-10-CM | POA: Diagnosis not present

## 2015-09-16 DIAGNOSIS — I69354 Hemiplegia and hemiparesis following cerebral infarction affecting left non-dominant side: Secondary | ICD-10-CM | POA: Diagnosis not present

## 2015-09-22 DIAGNOSIS — I5022 Chronic systolic (congestive) heart failure: Secondary | ICD-10-CM | POA: Diagnosis not present

## 2015-09-22 DIAGNOSIS — I2510 Atherosclerotic heart disease of native coronary artery without angina pectoris: Secondary | ICD-10-CM | POA: Diagnosis not present

## 2015-09-22 DIAGNOSIS — M6281 Muscle weakness (generalized): Secondary | ICD-10-CM | POA: Diagnosis not present

## 2015-09-22 DIAGNOSIS — I110 Hypertensive heart disease with heart failure: Secondary | ICD-10-CM | POA: Diagnosis not present

## 2015-09-22 DIAGNOSIS — I69354 Hemiplegia and hemiparesis following cerebral infarction affecting left non-dominant side: Secondary | ICD-10-CM | POA: Diagnosis not present

## 2015-09-22 DIAGNOSIS — J449 Chronic obstructive pulmonary disease, unspecified: Secondary | ICD-10-CM | POA: Diagnosis not present

## 2015-09-23 ENCOUNTER — Other Ambulatory Visit (HOSPITAL_COMMUNITY): Payer: Self-pay | Admitting: Internal Medicine

## 2015-09-24 DIAGNOSIS — I5022 Chronic systolic (congestive) heart failure: Secondary | ICD-10-CM | POA: Diagnosis not present

## 2015-09-24 DIAGNOSIS — M6281 Muscle weakness (generalized): Secondary | ICD-10-CM | POA: Diagnosis not present

## 2015-09-24 DIAGNOSIS — I2510 Atherosclerotic heart disease of native coronary artery without angina pectoris: Secondary | ICD-10-CM | POA: Diagnosis not present

## 2015-09-24 DIAGNOSIS — I69354 Hemiplegia and hemiparesis following cerebral infarction affecting left non-dominant side: Secondary | ICD-10-CM | POA: Diagnosis not present

## 2015-09-24 DIAGNOSIS — J449 Chronic obstructive pulmonary disease, unspecified: Secondary | ICD-10-CM | POA: Diagnosis not present

## 2015-09-24 DIAGNOSIS — I110 Hypertensive heart disease with heart failure: Secondary | ICD-10-CM | POA: Diagnosis not present

## 2015-09-28 DIAGNOSIS — J449 Chronic obstructive pulmonary disease, unspecified: Secondary | ICD-10-CM | POA: Diagnosis not present

## 2015-09-28 DIAGNOSIS — I110 Hypertensive heart disease with heart failure: Secondary | ICD-10-CM | POA: Diagnosis not present

## 2015-09-28 DIAGNOSIS — I5022 Chronic systolic (congestive) heart failure: Secondary | ICD-10-CM | POA: Diagnosis not present

## 2015-09-28 DIAGNOSIS — I69354 Hemiplegia and hemiparesis following cerebral infarction affecting left non-dominant side: Secondary | ICD-10-CM | POA: Diagnosis not present

## 2015-09-28 DIAGNOSIS — M6281 Muscle weakness (generalized): Secondary | ICD-10-CM | POA: Diagnosis not present

## 2015-09-28 DIAGNOSIS — I2510 Atherosclerotic heart disease of native coronary artery without angina pectoris: Secondary | ICD-10-CM | POA: Diagnosis not present

## 2015-10-01 DIAGNOSIS — J449 Chronic obstructive pulmonary disease, unspecified: Secondary | ICD-10-CM | POA: Diagnosis not present

## 2015-10-01 DIAGNOSIS — I69354 Hemiplegia and hemiparesis following cerebral infarction affecting left non-dominant side: Secondary | ICD-10-CM | POA: Diagnosis not present

## 2015-10-01 DIAGNOSIS — I2510 Atherosclerotic heart disease of native coronary artery without angina pectoris: Secondary | ICD-10-CM | POA: Diagnosis not present

## 2015-10-01 DIAGNOSIS — M6281 Muscle weakness (generalized): Secondary | ICD-10-CM | POA: Diagnosis not present

## 2015-10-01 DIAGNOSIS — I5022 Chronic systolic (congestive) heart failure: Secondary | ICD-10-CM | POA: Diagnosis not present

## 2015-10-01 DIAGNOSIS — I110 Hypertensive heart disease with heart failure: Secondary | ICD-10-CM | POA: Diagnosis not present

## 2015-10-05 DIAGNOSIS — I2510 Atherosclerotic heart disease of native coronary artery without angina pectoris: Secondary | ICD-10-CM | POA: Diagnosis not present

## 2015-10-05 DIAGNOSIS — I69354 Hemiplegia and hemiparesis following cerebral infarction affecting left non-dominant side: Secondary | ICD-10-CM | POA: Diagnosis not present

## 2015-10-05 DIAGNOSIS — I5022 Chronic systolic (congestive) heart failure: Secondary | ICD-10-CM | POA: Diagnosis not present

## 2015-10-05 DIAGNOSIS — J449 Chronic obstructive pulmonary disease, unspecified: Secondary | ICD-10-CM | POA: Diagnosis not present

## 2015-10-05 DIAGNOSIS — M6281 Muscle weakness (generalized): Secondary | ICD-10-CM | POA: Diagnosis not present

## 2015-10-05 DIAGNOSIS — I110 Hypertensive heart disease with heart failure: Secondary | ICD-10-CM | POA: Diagnosis not present

## 2015-10-06 ENCOUNTER — Ambulatory Visit (INDEPENDENT_AMBULATORY_CARE_PROVIDER_SITE_OTHER): Payer: Medicare Other | Admitting: Neurology

## 2015-10-06 ENCOUNTER — Encounter: Payer: Self-pay | Admitting: Neurology

## 2015-10-06 VITALS — BP 128/51 | HR 45 | Ht 70.0 in | Wt 153.0 lb

## 2015-10-06 DIAGNOSIS — I480 Paroxysmal atrial fibrillation: Secondary | ICD-10-CM

## 2015-10-06 DIAGNOSIS — Z7901 Long term (current) use of anticoagulants: Secondary | ICD-10-CM | POA: Diagnosis not present

## 2015-10-06 DIAGNOSIS — I10 Essential (primary) hypertension: Secondary | ICD-10-CM

## 2015-10-06 DIAGNOSIS — I509 Heart failure, unspecified: Secondary | ICD-10-CM | POA: Diagnosis not present

## 2015-10-06 DIAGNOSIS — I610 Nontraumatic intracerebral hemorrhage in hemisphere, subcortical: Secondary | ICD-10-CM | POA: Diagnosis not present

## 2015-10-06 NOTE — Progress Notes (Signed)
STROKE NEUROLOGY FOLLOW UP NOTE  NAME: [name] DOB: 21-Sep-1950  REASON FOR VISIT: stroke follow up HISTORY FROM: chart and pt  Today we had the pleasure of seeing [name] in follow-up at our Neurology Clinic. Pt was accompanied by son.   History Summary [name] is an 65 y.o. male who has known Afib on Eliquis was admitted on 04/07/14 for suddenly felt dizzy, left facial droop and left sided weakness, arm>leg. CT head showed a right IC basal ganglia hemorrhage with ventricular extension. BP 158/85. s/p Kcentra. Repeat CT brain on 04/08/14 Right thalamic intraparenchymal hematoma stable and without hydrocephalus. CT brain on 04/10/14 of brain revealed Stable intra-axial hemorrhage centered at the right thalamus with small volume intraventricular extension of blood and no ventriculomegaly. Patient continues to have left upper and lower extremity weakness but has improved. Pt was discharged to rehab.   07/01/14 follow up - the patient has been doing better. He stayed in rehab for 3 weeks and then sent to SNF. On 05/03/14 he had repeat CT showed resolution of the ICH and ASA was started. His left LE strength almost back to baseline and he can walk with walker. LUE more weak at the shoulder. He is going to be discharged home next week. He follows with Dr. Haroldine Laws at cardiology next week.   11/03/14 follow up - pt has been doing well. Resumed eliquis in 06/2014 after discussion with Dr. Haroldine Laws, tolerating well. He had left eye cataract on 10/12/14 and will have left eye surgery next Monday. He recently got cortisol injection to his right hip for pain management, stopped Eliquis for 3 days. Currently resumed. Otherwise no complains. BP 113/40 in clinic today.  05/05/15 follow up - pt has been doing well. Continued on eliquis without bleeding side effects. BP controlled well, today 117/42. No PT OT currently due to insurance coverage. Had recent follow up with Dr. Alvester Chou, had  CUS showed stable right ICA 40-59% stenosis and stable left 60-79% stenosis. Will need follow up in one year. He has TTE scheduled next month.   Interval History During the interval time, pt has been doing the same. He came in wheelchair but stated he walks with walker at home. BP controlled well at home 130/80, today in [place]. Still has home PT/OT. Continue following up with Dr. Haroldine Laws in cardiology. No other complains.   REVIEW OF SYSTEMS: Full 14 system review of systems performed and notable only for those listed below and in HPI above, all others are negative:  Constitutional:   Cardiovascular:  Ear/Nose/Throat:   Skin:  Eyes:   Respiratory:   Gastroitestinal:   Genitourinary:  Hematology/Lymphatic:   Endocrine:  Musculoskeletal:   Allergy/Immunology:   Neurological:   Psychiatric:  Sleep:   The following represents the patient's updated allergies and side effects list: No Known Allergies  The neurologically relevant items on the patient's problem list were reviewed on today's visit.  Neurologic Examination  A problem focused neurological exam (12 or more points of the single system neurologic examination, vital signs counts as 1 point, cranial nerves count for 8 points) was performed.  Blood pressure 128/51, pulse 45, height 5\' 10"  (1.778 m), weight 153 lb (69.4 kg).  General - Well nourished, well developed, in no apparent distress.  Ophthalmologic - Sharp disc margins OU.  Cardiovascular - Regular rate and rhythm with no murmur.  Mental Status -  Level of arousal and orientation to time, place, and person were intact. Language  including expression, naming, repetition, comprehension, reading, and writing was assessed and found intact. Fund of Knowledge was assessed and was intact.  Cranial Nerves II - XII - II - Visual field intact OU. III, IV, VI - Extraocular movements intact. V - Facial sensation symmetrical. VII - Facial movement intact  bilaterally. VIII - Hearing & vestibular intact bilaterally. X - Palate elevates symmetrically. XI - Chin turning & shoulder shrug intact bilaterally. XII - Tongue protrusion intact.  Motor Strength - The patient's strength was normal in all extremities except left hand mild dexterity difficulty and pronator drift was absent.  Bulk was normal and fasciculations were absent.   Motor Tone - Muscle tone was assessed at the neck and appendages and was normal.  Reflexes - The patient's reflexes were normal in all extremities and he had no pathological reflexes.  Sensory - Light touch, temperature/pinprick were assessed and were symmetrical.   Coordination - The patient had ataxia on FTN at left hand which is out of proportion to the weakness.  Tremor was absent.  Gait and Station - in wheelchair, gait not tested.  Data reviewed: I personally reviewed the images and agree with the radiology interpretations.  Ct Head Wo Contrast 04/10/2014  1. Stable intra-axial hemorrhage centered at the right thalamus with small volume intraventricular extension of blood.  2. Stable mild regional mass effect. No ventriculomegaly.  3. No new intracranial abnormality.   04/07/14:  1. Acute posterior RIGHT basal ganglia and internal capsule hemorrhage measuring 15 mm x 23 mm. 2. 4 mm of RIGHT to LEFT midline shift. 3. Area of low attenuation in the posterior LEFT temporal lobe may represent subacute ischemia but the appearance is more suggestive of either an old infarct or asymmetric chronic ischemic white matter disease.  04/08/14: Stable exams inch yesterday. Right thalamic intraparenchymal hematoma again measured at 15 x 24 mm. Mild surrounding edema. Small amount of intraventricular hemorrhage, not increased and without hydrocephalus.  CUS - The vertebral arteries appear patent with antegrade flow. - Findings consistent with 46 - 47 percent stenosis involving the right internal  carotid artery. - Findings consistent with 60 - 79 percent stenosis involving the left internal carotid artery.  EKG Ectopic atrial tachycardia, unifocal, Nonspecific ST and T wave abnormality  TTE 06/10/15 - Compared to a prior echo in 2015, the EF has improved to 55-60%,  there is a wide diastolic color jet below the bioprosthetic  aortic valve which may represent moderate to severe AI. This is a  new finding. Unfortunately, the valve leaflets are occluded by  the annular ring. Consider TEE for further imaging of the aortic  bioprosthesis.  Assessment: As you may recall, he is a 65 y.o. Caucasian male with PMH of known Afib on Eliquis was admitted on 04/07/14 for right BG hemorrhage with ventricular extension. BP 158/85. s/p Kcentra. Repeat CT showed stable hematoma without hydrocephalus. Patient continued to have left upper and lower extremity weakness but has improved. Pt was discharged to rehab. In 04/2014 repeat CT showed resolution of hemorrhage and ASA started. After discuss with his cardiologist Dr. Haroldine Laws, resumed eliquis in 06/2014. So far pt tolerating the medication well. Following up with cardiology for annual follow up of b/l ICA stenosis. Last CUS showed stable right ICA 40-59% and left 60-79% stenosis.  Plan:  - continue eliquis and lipitor for stroke prevention. - check BP at home. BP goal < 130 to avoid hemorrhage while on anticoagulation - continue follow up with cardiology - discuss with  Dr. Haroldine Laws about concurrent use of amiodarone and eliquis, also discuss about carotid ultrasound monitoring - Follow up with your primary care physician for stroke risk factor modification. Recommend maintain blood pressure goal <130/80, diabetes with hemoglobin A1c goal below 6.5% and lipids with LDL cholesterol goal below 70 mg/dL.  - continue home PT/OT - follow up in one year.  I spent more than 25 minutes of face to face time with the patient. Greater than 50% of time  was spent in counseling and coordination of care.    No orders of the defined types were placed in this encounter.    No orders of the defined types were placed in this encounter.    Patient Instructions  - continue eliquis and lipitor for stroke prevention. - check BP at home. BP goal < 130 to avoid hemorrhage while on anticoagulation - continue follow up with cardiology - discuss with Dr. Haroldine Laws about concurrent use of amiodarone and eliquis, also discuss about carotid ultrasound monitoring - Follow up with your primary care physician for stroke risk factor modification. Recommend maintain blood pressure goal <130/80, diabetes with hemoglobin A1c goal below 6.5% and lipids with LDL cholesterol goal below 70 mg/dL.  - continue home PT/OT - follow up in one year.    Rosalin Hawking, MD PhD [facility] [address], [place], [place] [address] [phone]

## 2015-10-06 NOTE — Patient Instructions (Addendum)
-   continue eliquis and lipitor for stroke prevention. - check BP at home. BP goal < 130 to avoid hemorrhage while on anticoagulation - continue follow up with cardiology - discuss with Dr. Haroldine Laws about concurrent use of amiodarone and eliquis, also discuss about carotid ultrasound monitoring - Follow up with your primary care physician for stroke risk factor modification. Recommend maintain blood pressure goal <130/80, diabetes with hemoglobin A1c goal below 6.5% and lipids with LDL cholesterol goal below 70 mg/dL.  - continue home PT/OT - follow up in one year.

## 2015-10-08 DIAGNOSIS — M6281 Muscle weakness (generalized): Secondary | ICD-10-CM | POA: Diagnosis not present

## 2015-10-08 DIAGNOSIS — I2510 Atherosclerotic heart disease of native coronary artery without angina pectoris: Secondary | ICD-10-CM | POA: Diagnosis not present

## 2015-10-08 DIAGNOSIS — I110 Hypertensive heart disease with heart failure: Secondary | ICD-10-CM | POA: Diagnosis not present

## 2015-10-08 DIAGNOSIS — J449 Chronic obstructive pulmonary disease, unspecified: Secondary | ICD-10-CM | POA: Diagnosis not present

## 2015-10-08 DIAGNOSIS — I69354 Hemiplegia and hemiparesis following cerebral infarction affecting left non-dominant side: Secondary | ICD-10-CM | POA: Diagnosis not present

## 2015-10-08 DIAGNOSIS — I5022 Chronic systolic (congestive) heart failure: Secondary | ICD-10-CM | POA: Diagnosis not present

## 2015-10-11 DIAGNOSIS — I10 Essential (primary) hypertension: Secondary | ICD-10-CM | POA: Diagnosis not present

## 2015-10-11 DIAGNOSIS — D649 Anemia, unspecified: Secondary | ICD-10-CM | POA: Diagnosis not present

## 2015-10-11 DIAGNOSIS — Z79899 Other long term (current) drug therapy: Secondary | ICD-10-CM | POA: Diagnosis not present

## 2015-10-11 DIAGNOSIS — I2510 Atherosclerotic heart disease of native coronary artery without angina pectoris: Secondary | ICD-10-CM | POA: Diagnosis not present

## 2015-10-11 DIAGNOSIS — E782 Mixed hyperlipidemia: Secondary | ICD-10-CM | POA: Diagnosis not present

## 2015-10-13 DIAGNOSIS — I110 Hypertensive heart disease with heart failure: Secondary | ICD-10-CM | POA: Diagnosis not present

## 2015-10-13 DIAGNOSIS — I5022 Chronic systolic (congestive) heart failure: Secondary | ICD-10-CM | POA: Diagnosis not present

## 2015-10-13 DIAGNOSIS — E782 Mixed hyperlipidemia: Secondary | ICD-10-CM | POA: Diagnosis not present

## 2015-10-13 DIAGNOSIS — D649 Anemia, unspecified: Secondary | ICD-10-CM | POA: Diagnosis not present

## 2015-10-13 DIAGNOSIS — J449 Chronic obstructive pulmonary disease, unspecified: Secondary | ICD-10-CM | POA: Diagnosis not present

## 2015-10-13 DIAGNOSIS — M4800 Spinal stenosis, site unspecified: Secondary | ICD-10-CM | POA: Diagnosis not present

## 2015-10-13 DIAGNOSIS — D696 Thrombocytopenia, unspecified: Secondary | ICD-10-CM | POA: Diagnosis not present

## 2015-10-13 DIAGNOSIS — D72819 Decreased white blood cell count, unspecified: Secondary | ICD-10-CM | POA: Diagnosis not present

## 2015-10-13 DIAGNOSIS — I69354 Hemiplegia and hemiparesis following cerebral infarction affecting left non-dominant side: Secondary | ICD-10-CM | POA: Diagnosis not present

## 2015-10-13 DIAGNOSIS — I482 Chronic atrial fibrillation: Secondary | ICD-10-CM | POA: Diagnosis not present

## 2015-10-15 DIAGNOSIS — I69354 Hemiplegia and hemiparesis following cerebral infarction affecting left non-dominant side: Secondary | ICD-10-CM | POA: Diagnosis not present

## 2015-10-15 DIAGNOSIS — M4800 Spinal stenosis, site unspecified: Secondary | ICD-10-CM | POA: Diagnosis not present

## 2015-10-15 DIAGNOSIS — I110 Hypertensive heart disease with heart failure: Secondary | ICD-10-CM | POA: Diagnosis not present

## 2015-10-15 DIAGNOSIS — I5022 Chronic systolic (congestive) heart failure: Secondary | ICD-10-CM | POA: Diagnosis not present

## 2015-10-15 DIAGNOSIS — I482 Chronic atrial fibrillation: Secondary | ICD-10-CM | POA: Diagnosis not present

## 2015-10-15 DIAGNOSIS — J449 Chronic obstructive pulmonary disease, unspecified: Secondary | ICD-10-CM | POA: Diagnosis not present

## 2015-10-18 ENCOUNTER — Telehealth: Payer: Self-pay | Admitting: Hematology and Oncology

## 2015-10-18 ENCOUNTER — Encounter: Payer: Self-pay | Admitting: Hematology and Oncology

## 2015-10-18 NOTE — Telephone Encounter (Signed)
Pt confirmed appt date/time and completed intake. Mailed pt letter.

## 2015-10-19 DIAGNOSIS — J449 Chronic obstructive pulmonary disease, unspecified: Secondary | ICD-10-CM | POA: Diagnosis not present

## 2015-10-19 DIAGNOSIS — I482 Chronic atrial fibrillation: Secondary | ICD-10-CM | POA: Diagnosis not present

## 2015-10-19 DIAGNOSIS — I110 Hypertensive heart disease with heart failure: Secondary | ICD-10-CM | POA: Diagnosis not present

## 2015-10-19 DIAGNOSIS — I69354 Hemiplegia and hemiparesis following cerebral infarction affecting left non-dominant side: Secondary | ICD-10-CM | POA: Diagnosis not present

## 2015-10-19 DIAGNOSIS — I5022 Chronic systolic (congestive) heart failure: Secondary | ICD-10-CM | POA: Diagnosis not present

## 2015-10-19 DIAGNOSIS — M4800 Spinal stenosis, site unspecified: Secondary | ICD-10-CM | POA: Diagnosis not present

## 2015-10-22 DIAGNOSIS — I5022 Chronic systolic (congestive) heart failure: Secondary | ICD-10-CM | POA: Diagnosis not present

## 2015-10-22 DIAGNOSIS — J449 Chronic obstructive pulmonary disease, unspecified: Secondary | ICD-10-CM | POA: Diagnosis not present

## 2015-10-22 DIAGNOSIS — M4800 Spinal stenosis, site unspecified: Secondary | ICD-10-CM | POA: Diagnosis not present

## 2015-10-22 DIAGNOSIS — I110 Hypertensive heart disease with heart failure: Secondary | ICD-10-CM | POA: Diagnosis not present

## 2015-10-22 DIAGNOSIS — I482 Chronic atrial fibrillation: Secondary | ICD-10-CM | POA: Diagnosis not present

## 2015-10-22 DIAGNOSIS — I69354 Hemiplegia and hemiparesis following cerebral infarction affecting left non-dominant side: Secondary | ICD-10-CM | POA: Diagnosis not present

## 2015-10-25 DIAGNOSIS — I110 Hypertensive heart disease with heart failure: Secondary | ICD-10-CM | POA: Diagnosis not present

## 2015-10-25 DIAGNOSIS — M4800 Spinal stenosis, site unspecified: Secondary | ICD-10-CM | POA: Diagnosis not present

## 2015-10-25 DIAGNOSIS — I5022 Chronic systolic (congestive) heart failure: Secondary | ICD-10-CM | POA: Diagnosis not present

## 2015-10-25 DIAGNOSIS — I69354 Hemiplegia and hemiparesis following cerebral infarction affecting left non-dominant side: Secondary | ICD-10-CM | POA: Diagnosis not present

## 2015-10-25 DIAGNOSIS — J449 Chronic obstructive pulmonary disease, unspecified: Secondary | ICD-10-CM | POA: Diagnosis not present

## 2015-10-25 DIAGNOSIS — I482 Chronic atrial fibrillation: Secondary | ICD-10-CM | POA: Diagnosis not present

## 2015-10-28 DIAGNOSIS — I482 Chronic atrial fibrillation: Secondary | ICD-10-CM | POA: Diagnosis not present

## 2015-10-28 DIAGNOSIS — M4800 Spinal stenosis, site unspecified: Secondary | ICD-10-CM | POA: Diagnosis not present

## 2015-10-28 DIAGNOSIS — J449 Chronic obstructive pulmonary disease, unspecified: Secondary | ICD-10-CM | POA: Diagnosis not present

## 2015-10-28 DIAGNOSIS — I5022 Chronic systolic (congestive) heart failure: Secondary | ICD-10-CM | POA: Diagnosis not present

## 2015-10-28 DIAGNOSIS — I110 Hypertensive heart disease with heart failure: Secondary | ICD-10-CM | POA: Diagnosis not present

## 2015-10-28 DIAGNOSIS — I69354 Hemiplegia and hemiparesis following cerebral infarction affecting left non-dominant side: Secondary | ICD-10-CM | POA: Diagnosis not present

## 2015-10-29 DIAGNOSIS — J449 Chronic obstructive pulmonary disease, unspecified: Secondary | ICD-10-CM | POA: Diagnosis not present

## 2015-10-29 DIAGNOSIS — I69354 Hemiplegia and hemiparesis following cerebral infarction affecting left non-dominant side: Secondary | ICD-10-CM | POA: Diagnosis not present

## 2015-10-29 DIAGNOSIS — M4800 Spinal stenosis, site unspecified: Secondary | ICD-10-CM | POA: Diagnosis not present

## 2015-10-29 DIAGNOSIS — I110 Hypertensive heart disease with heart failure: Secondary | ICD-10-CM | POA: Diagnosis not present

## 2015-11-01 ENCOUNTER — Ambulatory Visit (HOSPITAL_BASED_OUTPATIENT_CLINIC_OR_DEPARTMENT_OTHER): Payer: Medicare Other | Admitting: Hematology and Oncology

## 2015-11-01 ENCOUNTER — Encounter: Payer: Self-pay | Admitting: Hematology and Oncology

## 2015-11-01 ENCOUNTER — Telehealth: Payer: Self-pay | Admitting: *Deleted

## 2015-11-01 ENCOUNTER — Telehealth: Payer: Self-pay | Admitting: Hematology and Oncology

## 2015-11-01 VITALS — BP 134/41 | HR 52 | Temp 98.6°F | Resp 17 | Wt 152.8 lb

## 2015-11-01 DIAGNOSIS — D61818 Other pancytopenia: Secondary | ICD-10-CM | POA: Insufficient documentation

## 2015-11-01 DIAGNOSIS — I482 Chronic atrial fibrillation: Secondary | ICD-10-CM | POA: Diagnosis not present

## 2015-11-01 DIAGNOSIS — D631 Anemia in chronic kidney disease: Secondary | ICD-10-CM | POA: Insufficient documentation

## 2015-11-01 DIAGNOSIS — N189 Chronic kidney disease, unspecified: Secondary | ICD-10-CM

## 2015-11-01 DIAGNOSIS — J449 Chronic obstructive pulmonary disease, unspecified: Secondary | ICD-10-CM | POA: Diagnosis not present

## 2015-11-01 DIAGNOSIS — J9610 Chronic respiratory failure, unspecified whether with hypoxia or hypercapnia: Secondary | ICD-10-CM

## 2015-11-01 DIAGNOSIS — N183 Chronic kidney disease, stage 3 (moderate): Secondary | ICD-10-CM

## 2015-11-01 NOTE — Progress Notes (Signed)
[name] CONSULT NOTE  Patient Care Team: Merrilee Seashore, MD as PCP - General (Internal Medicine)  CHIEF COMPLAINTS/PURPOSE OF CONSULTATION:  Pancytopenia  HISTORY OF PRESENTING ILLNESS:  [name] 65 y.o. male is here because of pancytopenia. The patient is here today accompanied by his brother. The patient has significant major comorbidities including history of stroke causing persistent left-sided weakness, chronic respiratory failure secondary to COPD on chronic oxygen therapy for the past 4 years, chronic atrial fibrillation and significant peripheral vascular disease on chronic anticoagulation therapy and chronic kidney disease. I had the opportunity to review his blood work from 2010. At the time, he has normal CBC. Starting around the end of 2014, he started to develop progressive anemia. Subsequently, starting around May 2016, he started to have progressive thrombocytopenia. According to scanned medical records, he had multiple blood tests done at close interval since September 2016. WBC has reduced from the 3 range down to 2.8 most recently on 10/11/2015. His hemoglobin has drifted down to 8.5 with high MCV and platelet count has drifted down to 85,000 Serum folate and vitamin B-12 were within normal limits. He had iron studies done with serum ferritin at 60. The patient has been placed on iron supplement for some time without significant benefit.  He denies recent chest pain on exertion, shortness of breath on minimal exertion, pre-syncopal episodes, or palpitations. Having said that, the patient is quite immobile and only walk around the house on his walker several times a day. At most times, he will sit on the couch watching television He had not noticed any recent bleeding such as epistaxis, hematuria or hematochezia The patient denies over the counter NSAID ingestion. He is on chronic anticoagulation therapy.  He had no prior history or diagnosis of  cancer. His age appropriate screening programs are up-to-date. He denies any pica and eats a variety of diet. He never donated blood or received blood transfusion He denies recent infection. Denies changes in appetite. He is mildly constipated from iron supplementation.   MEDICAL HISTORY:  Past Medical History  Diagnosis Date  . Hypertension   . Gout   . Aortic stenosis     echo 06/19/08 with nomal LV function, moderate concentric LVH moderate aortic stenosis area 0.99 cm squared, peak gradient of 50 and mean of 31  . Hyperlipidemia   . Atrial fibrillation with RVR, was in atrial fib in 04/2011 09/16/2012    a) s/p MAZE (09/2012)   . Tobacco use 09/16/2012  . Heart murmur   . Chronic combined systolic and diastolic CHF (congestive heart failure) ([place]) 09/16/2012    a) ECHO (08/2013) EF 50-55%, grade II DD b) TEE ECHO (09/2013): 40-45%, bioprosthesis present, mild MR  . Aortic stenosis, severe 09/23/2012  . CAD (coronary artery disease), single vessel disease 09/23/2012    a) CABG (SVG to PDA and PL) wtih AVR (09/2012)   . PAD (peripheral artery disease), decreased bil. ABIs 09/23/2012    a) ABIs (11/2013): Right mild arterial insufficiency, L normal; aroto-bifem bypass graft: not well visualized, bilateral SFAs = to greater than 50% in dimaeter reduction   . Gout flare. 09/29/12, Lt knee, improved with colchicine. 09/30/2012  . H/O aortic valve replacement   . AS (aortic stenosis) with AVR with pericardial tissue valve  09/30/2013  . Back pain, spinal stenosis 09/30/2013  . COPD (chronic obstructive pulmonary disease) ([place])   . Bilateral carotid artery disease ([place])   . Stroke [facility])     SURGICAL  HISTORY: Past Surgical History  Procedure Laterality Date  . Iliac artery stent  10/20/08    stent to lt iliac  . Aorto bifem bypass  12/18/08    by Dr. Trula Slade  . Coronary artery bypass graft N/A 10/08/2012    Procedure: CORONARY ARTERY BYPASS GRAFTING (CABG);  Surgeon: Grace Isaac, MD;  Location: [place];  Service: Open Heart Surgery;  Laterality: N/A;  Coronary Artery bypass Graft times two utilizing the left greater saphenous vein harvested endoscopically  . Aortic valve replacement N/A 10/08/2012    Procedure: AORTIC VALVE REPLACEMENT (AVR);  Surgeon: Grace Isaac, MD;  Location: [place];  Service: Open Heart Surgery;  Laterality: N/A;  . Maze N/A 10/08/2012    Procedure: MAZE;  Surgeon: Grace Isaac, MD;  Location: [place];  Service: Open Heart Surgery;  Laterality: N/A;  . Intraoperative transesophageal echocardiogram N/A 10/08/2012    Procedure: INTRAOPERATIVE TRANSESOPHAGEAL ECHOCARDIOGRAM;  Surgeon: Grace Isaac, MD;  Location: [place];  Service: Open Heart Surgery;  Laterality: N/A;  . Endovein harvest of greater saphenous vein Bilateral 10/08/2012    Procedure: ENDOVEIN HARVEST OF GREATER SAPHENOUS VEIN;  Surgeon: Grace Isaac, MD;  Location: [place];  Service: Open Heart Surgery;  Laterality: Bilateral;  . US echocardiography  06/20/2011    mod concentric LVH,LA severely dilated,RA mildly dilated,mod. ca+ of the mitral apparatus,trace MR,mod. ca+ AOV w/stenosis.  Marland Kitchen Nm myocar perf wall motion  08/25/2008    normal  . Cardioversion N/A 02/25/2013    Procedure: CARDIOVERSION;  Surgeon: Sanda Klein, MD;  Location: [place];  Service: Cardiovascular;  Laterality: N/A;  . Tee without cardioversion N/A 09/25/2013    Procedure: TRANSESOPHAGEAL ECHOCARDIOGRAM (TEE);  Surgeon: Thayer Headings, MD;  Location: [place];  Service: Cardiovascular;  Laterality: N/A;  . Cardioversion N/A 09/25/2013    Procedure: CARDIOVERSION;  Surgeon: Thayer Headings, MD;  Location: [facility] ENDOSCOPY;  Service: Cardiovascular;  Laterality: N/A;  . Left and right heart catheterization with coronary angiogram N/A 09/20/2012    Procedure: LEFT AND RIGHT HEART CATHETERIZATION WITH CORONARY ANGIOGRAM;  Surgeon: Lorretta Harp, MD;  Location: [facility] CATH LAB;  Service: Cardiovascular;  Laterality: N/A;    SOCIAL  HISTORY: Social History   Social History  . Marital Status: Divorced    Spouse Name: N/A  . Number of Children: N/A  . Years of Education: N/A   Occupational History  . retired    Social History Main Topics  . Smoking status: Former Smoker -- 1.00 packs/day for 43 years    Types: Cigarettes    Quit date: 09/12/2012  . Smokeless tobacco: Never Used     Comment: More than 43 + pack years as he smoked up to 2 1/2 ppd for many years. Had a period of cessation after femoral stent.  . Alcohol Use: 1.2 oz/week    2 Cans of beer per week     Comment: 2 beers a week  . Drug Use: No  . 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 Not on file   Social History Narrative    FAMILY HISTORY: Family History  Problem Relation Age of Onset  . Heart attack Father     died at 34 with MI  . Heart disease Brother   . Heart attack Brother   . Heart attack Brother   . Cancer Mother     Breast ca  ALLERGIES:  has No Known Allergies.  MEDICATIONS:  Current Outpatient Prescriptions  Medication Sig Dispense Refill  . albuterol (PROVENTIL HFA;VENTOLIN HFA) 108 (90 BASE) MCG/ACT inhaler Inhale 2 puffs into the lungs every 6 (six) hours as needed for wheezing or shortness of breath. 1 Inhaler 2  . amiodarone (PACERONE) 200 MG tablet TAKE 1 TABLET (200 MG TOTAL) BY MOUTH DAILY. 30 tablet 6  . apixaban (ELIQUIS) 5 MG TABS tablet Take 1 tablet (5 mg total) by mouth 2 (two) times daily. 180 tablet 3  . atorvastatin (LIPITOR) 80 MG tablet TAKE 1 TABLET EVERY DAY AT 6PM 30 tablet 2  . carvedilol (COREG) 25 MG tablet Take 0.5 tablets (12.5 mg total) by mouth 2 (two) times daily. 30 tablet 8  . Fe Fum-FePoly-Vit C-Vit B3 (INTEGRA) 62.5-62.5-40-3 MG CAPS Take 1 capsule by mouth every other day.   4  . folic acid (FOLVITE) 1 MG tablet Take 1 tablet (1 mg total) by mouth daily. 30 tablet 6  . lisinopril (PRINIVIL,ZESTRIL) 2.5 MG tablet TAKE 1  TABLET (2.5 MG TOTAL) BY MOUTH DAILY. 30 tablet 6  . OXYGEN-HELIUM IN Inhale 1-2 L into the lungs continuous. Is in the habit of not using his oxygen when he leaves the house. Uses 1L continuous normally Uses 2L for exertion    . pantoprazole (PROTONIX) 40 MG tablet TAKE 1 TABLET (40 MG TOTAL) BY MOUTH DAILY AS NEEDED (INDIGESTION/REFLUX). 30 tablet 3  . PRESCRIPTION MEDICATION Apply 1 application topically daily.    Marland Kitchen tiZANidine (ZANAFLEX) 4 MG tablet Take 1 tablet by mouth 2 (two) times daily.  5  . traMADol (ULTRAM) 50 MG tablet Take 1 tablet (50 mg total) by mouth 4 (four) times daily. 120 tablet 5   No current facility-administered medications for this visit.    REVIEW OF SYSTEMS:   Constitutional: Denies fevers, chills or abnormal night sweats Eyes: Denies blurriness of vision, double vision or watery eyes Ears, nose, mouth, throat, and face: Denies mucositis or sore throat Respiratory: Denies cough, dyspnea or wheezes Cardiovascular: Denies palpitation, chest discomfort or lower extremity swelling Skin: Denies abnormal skin rashes Lymphatics: Denies new lymphadenopathy or easy bruising Neurological:Denies numbness, tingling or new weaknesses. He has chronic left-sided deficit Behavioral/Psych: Mood is stable, no new changes  All other systems were reviewed with the patient and are negative.  PHYSICAL EXAMINATION: ECOG PERFORMANCE STATUS: 2 - Symptomatic, <50% confined to bed  Filed Vitals:   11/01/15 1119  BP: 134/41  Pulse: 52  Temp: 98.6 F (37 C)  Resp: 17   Filed Weights   11/01/15 1119  Weight: 152 lb 12.8 oz (69.31 kg)    GENERAL:alert, no distress and comfortable SKIN: skin color, texture, turgor are normal, no rashes or significant lesions EYES: normal, conjunctiva are pale and non-injected, sclera clear OROPHARYNX:no exudate, no erythema and lips, buccal mucosa, and tongue normal . Poor dentition is noted NECK: supple, thyroid normal size, non-tender,  without nodularity LYMPH:  no palpable lymphadenopathy in the cervical, axillary or inguinal LUNGS: clear to auscultation and percussion with normal breathing effort HEART: regular rate & rhythm and no murmurs and no lower extremity edema ABDOMEN:abdomen soft, non-tender and normal bowel sounds Musculoskeletal:no cyanosis of digits and no clubbing . Noted pale nail beds PSYCH: alert & oriented x 3 with fluent speech NEURO:Limited examination as the patient is sitting on the wheelchair. He has obvious left hemiparesis  ASSESSMENT & PLAN:  Pancytopenia, acquired ([place]) We discussed the rationale or pursuing bone marrow  aspirate and biopsy versus imaging study. All I can explain the anemia on the basis of anemia of chronic renal failure, I cannot explain the cause of his progressive thrombocytopenia and leukopenia. After extensive discussion, he is in agreement to proceed with unsedated bone marrow biopsy in 2 days. I will see him back after the bone marrow biopsy to review test results. Right now, he is not symptomatic. He can continue anticoagulation therapy as long as his platelet count is greater than 50,000. Due to lack of symptoms of anemia, he does not need blood transfusion  Orders Placed This Encounter  Procedures  . CBC & Diff and Retic    Standing Status: Future     Number of Occurrences:      Standing Expiration Date: 12/05/2016  . Comprehensive metabolic panel    Standing Status: Future     Number of Occurrences:      Standing Expiration Date: 12/05/2016  . Sedimentation rate    Standing Status: Future     Number of Occurrences:      Standing Expiration Date: 12/05/2016  . Fibrinogen    Standing Status: Future     Number of Occurrences:      Standing Expiration Date: 12/05/2016  . Erythropoietin    Standing Status: Future     Number of Occurrences:      Standing Expiration Date: 12/05/2016  . Iron and TIBC    Standing Status: Future     Number of Occurrences:       Standing Expiration Date: 12/05/2016  . Ferritin    Standing Status: Future     Number of Occurrences:      Standing Expiration Date: 12/05/2016     All questions were answered. The patient knows to call the clinic with any problems, questions or concerns. I spent 40 minutes counseling the patient face to face. The total time spent in the appointment was 55 minutes and more than 50% was on counseling.     [facility], [place], MD 11/01/2015 3:13 PM

## 2015-11-01 NOTE — Telephone Encounter (Signed)
per pof to sch pt appt-gave pt copty of avs °

## 2015-11-01 NOTE — Assessment & Plan Note (Signed)
We discussed the rationale or pursuing bone marrow aspirate and biopsy versus imaging study. All I can explain the anemia on the basis of anemia of chronic renal failure, I cannot explain the cause of his progressive thrombocytopenia and leukopenia. After extensive discussion, he is in agreement to proceed with unsedated bone marrow biopsy in 2 days. I will see him back after the bone marrow biopsy to review test results. Right now, he is not symptomatic. He can continue anticoagulation therapy as long as his platelet count is greater than 50,000. Due to lack of symptoms of anemia, he does not need blood transfusion

## 2015-11-01 NOTE — Telephone Encounter (Signed)
Per staff message and POF I have scheduled appts. Advised scheduler of appts. JMW  

## 2015-11-02 DIAGNOSIS — M4800 Spinal stenosis, site unspecified: Secondary | ICD-10-CM | POA: Diagnosis not present

## 2015-11-02 DIAGNOSIS — I482 Chronic atrial fibrillation: Secondary | ICD-10-CM | POA: Diagnosis not present

## 2015-11-02 DIAGNOSIS — I69354 Hemiplegia and hemiparesis following cerebral infarction affecting left non-dominant side: Secondary | ICD-10-CM | POA: Diagnosis not present

## 2015-11-02 DIAGNOSIS — I5022 Chronic systolic (congestive) heart failure: Secondary | ICD-10-CM | POA: Diagnosis not present

## 2015-11-02 DIAGNOSIS — I110 Hypertensive heart disease with heart failure: Secondary | ICD-10-CM | POA: Diagnosis not present

## 2015-11-02 DIAGNOSIS — J449 Chronic obstructive pulmonary disease, unspecified: Secondary | ICD-10-CM | POA: Diagnosis not present

## 2015-11-03 ENCOUNTER — Telehealth: Payer: Self-pay | Admitting: *Deleted

## 2015-11-03 ENCOUNTER — Other Ambulatory Visit: Payer: Self-pay | Admitting: Hematology and Oncology

## 2015-11-03 ENCOUNTER — Encounter: Payer: Self-pay | Admitting: Hematology and Oncology

## 2015-11-03 ENCOUNTER — Ambulatory Visit (HOSPITAL_BASED_OUTPATIENT_CLINIC_OR_DEPARTMENT_OTHER): Payer: Medicare Other | Admitting: Hematology and Oncology

## 2015-11-03 ENCOUNTER — Other Ambulatory Visit (HOSPITAL_BASED_OUTPATIENT_CLINIC_OR_DEPARTMENT_OTHER): Payer: Medicare Other

## 2015-11-03 ENCOUNTER — Other Ambulatory Visit (HOSPITAL_COMMUNITY)
Admission: RE | Admit: 2015-11-03 | Discharge: 2015-11-03 | Disposition: A | Discharge status: Home / Self Care | Payer: Medicare Other | Source: Ambulatory Visit | Attending: Hematology and Oncology | Admitting: Hematology and Oncology

## 2015-11-03 VITALS — BP 135/37 | HR 48 | Temp 97.0°F | Resp 18

## 2015-11-03 DIAGNOSIS — D631 Anemia in chronic kidney disease: Secondary | ICD-10-CM | POA: Diagnosis not present

## 2015-11-03 DIAGNOSIS — D61818 Other pancytopenia: Secondary | ICD-10-CM | POA: Insufficient documentation

## 2015-11-03 DIAGNOSIS — N183 Chronic kidney disease, stage 3 (moderate): Secondary | ICD-10-CM

## 2015-11-03 DIAGNOSIS — C8300 Small cell B-cell lymphoma, unspecified site: Secondary | ICD-10-CM | POA: Diagnosis not present

## 2015-11-03 DIAGNOSIS — E875 Hyperkalemia: Secondary | ICD-10-CM | POA: Insufficient documentation

## 2015-11-03 HISTORY — DX: Hyperkalemia: E87.5

## 2015-11-03 LAB — IRON AND TIBC
%SAT: 9 % — ABNORMAL LOW (ref 20–55)
IRON: 30 ug/dL — AB (ref 42–163)
TIBC: 346 ug/dL (ref 202–409)
UIBC: 316 ug/dL (ref 117–376)

## 2015-11-03 LAB — CBC & DIFF AND RETIC
BASO%: 2 % (ref 0.0–2.0)
Basophils Absolute: 0.1 10*3/uL (ref 0.0–0.1)
EOS ABS: 0.2 10*3/uL (ref 0.0–0.5)
EOS%: 5.7 % (ref 0.0–7.0)
HCT: 28.4 % — ABNORMAL LOW (ref 38.4–49.9)
HGB: 8.9 g/dL — ABNORMAL LOW (ref 13.0–17.1)
IMMATURE RETIC FRACT: 3.5 % (ref 3.00–10.60)
LYMPH%: 34.6 % (ref 14.0–49.0)
MCH: 28 pg (ref 27.2–33.4)
MCHC: 31.3 g/dL — ABNORMAL LOW (ref 32.0–36.0)
MCV: 89.7 fL (ref 79.3–98.0)
MONO#: 0.5 10*3/uL (ref 0.1–0.9)
MONO%: 15.8 % — ABNORMAL HIGH (ref 0.0–14.0)
NEUT%: 41.9 % (ref 39.0–75.0)
NEUTROS ABS: 1.2 10*3/uL — AB (ref 1.5–6.5)
NRBC: 0 % (ref 0–0)
PLATELETS: 99 10*3/uL — AB (ref 140–400)
RBC: 3.16 10*6/uL — AB (ref 4.20–5.82)
RDW: 15.7 % — ABNORMAL HIGH (ref 11.0–14.6)
RETIC CT ABS: 56.56 10*3/uL (ref 34.80–93.90)
Retic %: 1.79 % (ref 0.80–1.80)
WBC: 2.9 10*3/uL — AB (ref 4.0–10.3)
lymph#: 1 10*3/uL (ref 0.9–3.3)

## 2015-11-03 LAB — COMPREHENSIVE METABOLIC PANEL
ALK PHOS: 78 U/L (ref 40–150)
ALT: 31 U/L (ref 0–55)
AST: 51 U/L — ABNORMAL HIGH (ref 5–34)
Albumin: 2.8 g/dL — ABNORMAL LOW (ref 3.5–5.0)
Anion Gap: 6 mEq/L (ref 3–11)
BILIRUBIN TOTAL: 0.33 mg/dL (ref 0.20–1.20)
BUN: 30.8 mg/dL — AB (ref 7.0–26.0)
CO2: 22 meq/L (ref 22–29)
CREATININE: 1.9 mg/dL — AB (ref 0.7–1.3)
Calcium: 8.9 mg/dL (ref 8.4–10.4)
Chloride: 112 mEq/L — ABNORMAL HIGH (ref 98–109)
EGFR: 35 mL/min/{1.73_m2} — ABNORMAL LOW (ref >90)
Glucose: 84 mg/dl (ref 70–140)
Potassium: 5.9 mEq/L — ABNORMAL HIGH (ref 3.5–5.1)
SODIUM: 141 meq/L (ref 136–145)
TOTAL PROTEIN: 6.4 g/dL (ref 6.4–8.3)

## 2015-11-03 LAB — BONE MARROW EXAM

## 2015-11-03 LAB — FERRITIN: FERRITIN: 61 ng/mL (ref 22–316)

## 2015-11-03 MED ORDER — SODIUM POLYSTYRENE SULFONATE 15 GM/60ML PO SUSP: 30.0000 g | Freq: Once | ORAL | Status: DC

## 2015-11-03 NOTE — Assessment & Plan Note (Signed)
We discussed the role for bone marrow aspirate and biopsy and he agreed to proceed. Currently, he is asymptomatic. Bone Marrow Biopsy and Aspiration Procedure Note   Informed consent was obtained and potential risks including bleeding, infection and pain were reviewed with the patient. The patient's name, date of birth, identification, consent and allergies were verified prior to the start of procedure and time out was performed.  The right posterior iliac crest was chosen as the site of biopsy.  The skin was prepped with Chloraprep solution.   4 cc of 1% lidocaine was used to provide local anaesthesia.   10 cc of bone marrow aspirate was obtained followed by 1 inch biopsy.   The procedure was tolerated well and there were no complications.  The patient was stable at the end of the procedure.  Specimens sent for flow cytometry, cytogenetics and additional studies.

## 2015-11-03 NOTE — Telephone Encounter (Signed)
Informed pt of elevated Potassium and instructions from Dr. Alvy Bimler to hold Lisinopril and take Kayexalate today.  Warned pt about diarrhea w/ Kayexalate and to drink plenty of fluids.  Return on Friday to recheck potassium level.  Rx sent to his pharmacy.  Pt verbalized understanding.

## 2015-11-03 NOTE — Telephone Encounter (Signed)
-----   Message from Heath Lark, MD sent at 11/03/2015 10:14 AM EDT ----- Regarding: high potassium Pls call him to stop lisinopril He needs to start on oral kayexalate 30 g PO x 1 and increase oral liquids. I will place a new POF to bring him back Friday for recheck potassium

## 2015-11-03 NOTE — Patient Instructions (Signed)
[facility] Discharge Instructions for Post Bone Marrow Procedure  Today you had a bone marrow biopsy and aspirate of right illiac   Please keep the pressure dressing in place for at least 24 hours.  Have someone check your dressing periodically for bleeding.  If needed you can reapply a pressure dressing to the site.  Take pain medication  as directed.  IF BLEEDING REOCCURS THAT SHOULD BE REPORTED IMMEDIATELY. Call the [facility] at [phone] if during business hours. Or report to the Emergency Room.   I have been informed and understand all the instructions given to me. I know to contact the clinic, my physician, or go to the Emergency Department if any problems should occur. I do not have any questions at this time, but understand that I may call the clinic during office hours at [phone]  should I have any questions or need assistance in obtaining follow up care.    __________________________________________  _____________  __________ Signature of Patient or Authorized Representative            Date                   Time    __________________________________________ Nurse's Signature    Bone Marrow Aspiration and Bone Marrow Biopsy Bone marrow aspiration and bone marrow biopsy are procedures that are done to diagnose blood disorders. You may also have one of these procedures to help diagnose infections or some types of cancer. Bone marrow is the soft tissue that is inside your bones. Blood cells are produced in bone marrow. For bone marrow aspiration, a sample of tissue in liquid form is removed from inside your bone. For a bone marrow biopsy, a small core of bone marrow tissue is removed. Then these samples are examined under a microscope or tested in a lab. You may need these procedures if you have an abnormal complete blood count (CBC). The aspiration or biopsy sample is usually taken from the top of your hip bone. Sometimes, an aspiration sample is taken  from your chest bone (sternum). LET [facility] CARE PROVIDER KNOW ABOUT:  Any allergies you have.  All medicines you are taking, including vitamins, herbs, eye drops, creams, and over-the-counter medicines.  Previous problems you or members of your family have had with the use of anesthetics.  Any blood disorders you have.  Previous surgeries you have had.  Any medical conditions you may have.  Whether you are pregnant or you think that you may be pregnant. RISKS AND COMPLICATIONS Generally, this is a safe procedure. However, problems may occur, including:  Infection.  Bleeding. BEFORE THE PROCEDURE  Ask your health care provider about:  Changing or stopping your regular medicines. This is especially important if you are taking diabetes medicines or blood thinners.  Taking medicines such as aspirin and ibuprofen. These medicines can thin your blood. Do not take these medicines before your procedure if your health care provider instructs you not to.  Plan to have someone take you home after the procedure.  If you go home right after the procedure, plan to have someone with you for 24 hours. PROCEDURE   An IV tube may be inserted into one of your veins.  The injection site will be cleaned with a germ-killing solution (antiseptic).  You will be given one or more of the following:  A medicine that helps you relax (sedative).  A medicine that numbs the area (local anesthetic).  The bone marrow sample  will be removed as follows:  For an aspiration, a hollow needle will be inserted through your skin and into your bone. Bone marrow fluid will be drawn up into a syringe.  For a biopsy, your health care provider will use a hollow needle to remove a core of tissue from your bone marrow.  The needle will be removed.  A bandage (dressing) will be placed over the insertion site and taped in place. The procedure may vary among health care providers and hospitals. AFTER THE  PROCEDURE  Your blood pressure, heart rate, breathing rate, and blood oxygen level will be monitored often until the medicines you were given have worn off.  Return to your normal activities as directed by your health care provider.   This information is not intended to replace advice given to you by your health care provider. Make sure you discuss any questions you have with your health care provider.   Document Released: 04/13/2004 Document Revised: 08/25/2014 Document Reviewed: 04/01/2014 Elsevier Interactive Patient Education [employer].

## 2015-11-03 NOTE — Progress Notes (Addendum)
[place], [place] SUMMARY OF HEMATOLOGIC HISTORY:  [name] 65 y.o. male was seen on 11/01/15 because of pancytopenia. The patient is here today accompanied by his brother. The patient has significant major comorbidities including history of stroke causing persistent left-sided weakness, chronic respiratory failure secondary to COPD on chronic oxygen therapy for the past 4 years, chronic atrial fibrillation and significant peripheral vascular disease on chronic anticoagulation therapy and chronic kidney disease. I had the opportunity to review his blood work from 2010. At the time, he has normal CBC. Starting around the end of 2014, he started to develop progressive anemia. Subsequently, starting around May 2016, he started to have progressive thrombocytopenia. According to scanned medical records, he had multiple blood tests done at close interval since September 2016. WBC has reduced from the 3 range down to 2.8 most recently on 10/11/2015. His hemoglobin has drifted down to 8.5 with high MCV and platelet count has drifted down to 85,000 Serum folate and vitamin B-12 were within normal limits. He had iron studies done with serum ferritin at 60. The patient has been placed on iron supplement for some time without significant benefit.  INTERVAL HISTORY: [name] 65 y.o. male returns for bone marrow aspirate and biopsy today. He is asymptomatic since I saw him. The patient denies any recent signs or symptoms of bleeding such as spontaneous epistaxis, hematuria or hematochezia.   I have reviewed the past medical history, past surgical history, social history and family history with the patient and they are unchanged from previous note.  ALLERGIES:  has No Known Allergies.  MEDICATIONS:  Current Outpatient Prescriptions  Medication Sig Dispense Refill  . albuterol (PROVENTIL HFA;VENTOLIN HFA) 108 (90 BASE) MCG/ACT inhaler  Inhale 2 puffs into the lungs every 6 (six) hours as needed for wheezing or shortness of breath. 1 Inhaler 2  . amiodarone (PACERONE) 200 MG tablet TAKE 1 TABLET (200 MG TOTAL) BY MOUTH DAILY. 30 tablet 6  . apixaban (ELIQUIS) 5 MG TABS tablet Take 1 tablet (5 mg total) by mouth 2 (two) times daily. 180 tablet 3  . atorvastatin (LIPITOR) 80 MG tablet TAKE 1 TABLET EVERY DAY AT 6PM 30 tablet 2  . carvedilol (COREG) 25 MG tablet Take 0.5 tablets (12.5 mg total) by mouth 2 (two) times daily. 30 tablet 8  . Fe Fum-FePoly-Vit C-Vit B3 (INTEGRA) 62.5-62.5-40-3 MG CAPS Take 1 capsule by mouth every other day.   4  . folic acid (FOLVITE) 1 MG tablet Take 1 tablet (1 mg total) by mouth daily. 30 tablet 6  . lisinopril (PRINIVIL,ZESTRIL) 2.5 MG tablet TAKE 1 TABLET (2.5 MG TOTAL) BY MOUTH DAILY. 30 tablet 6  . OXYGEN-HELIUM IN Inhale 1-2 L into the lungs continuous. Is in the habit of not using his oxygen when he leaves the house. Uses 1L continuous normally Uses 2L for exertion    . pantoprazole (PROTONIX) 40 MG tablet TAKE 1 TABLET (40 MG TOTAL) BY MOUTH DAILY AS NEEDED (INDIGESTION/REFLUX). 30 tablet 3  . PRESCRIPTION MEDICATION Apply 1 application topically daily.    Marland Kitchen tiZANidine (ZANAFLEX) 4 MG tablet Take 1 tablet by mouth 2 (two) times daily.  5  . traMADol (ULTRAM) 50 MG tablet Take 1 tablet (50 mg total) by mouth 4 (four) times daily. 120 tablet 5   No current facility-administered medications for this visit.     REVIEW OF SYSTEMS:   All other systems were reviewed with the patient and  are negative.  PHYSICAL EXAMINATION: ECOG PERFORMANCE STATUS: 1 - Symptomatic but completely ambulatory  Filed Vitals:   11/03/15 0749 11/03/15 0818  BP: 113/32 135/37  Pulse: 47 48  Temp: 97 F (36.1 C)   Resp: 18    There were no vitals filed for this visit.  GENERAL:alert, no distress and comfortable SKIN: skin color, texture, turgor are normal, no rashes or significant lesions EYES: normal,  Conjunctiva are pink and non-injected, sclera clear Musculoskeletal:no cyanosis of digits and no clubbing   LABORATORY DATA:  I have reviewed the data as listed     Component Value Date/Time   NA 139 12/17/2014 1048   K 5.2* 12/17/2014 1048   CL 108 12/17/2014 1048   CO2 25 12/17/2014 1048   GLUCOSE 97 12/17/2014 1048   BUN 30* 12/17/2014 1048   CREATININE 2.19* 12/17/2014 1048   CREATININE 1.38* 12/31/2013 1100   CALCIUM 8.5* 12/17/2014 1048   PROT 7.0 06/10/2014 1047   ALBUMIN 3.2* 06/10/2014 1047   AST 45* 06/10/2014 1047   ALT 26 06/10/2014 1047   ALKPHOS 74 06/10/2014 1047   BILITOT 0.6 06/10/2014 1047   GFRNONAA 30* 12/17/2014 1048   GFRNONAA 57* 10/23/2013 1007   GFRAA 35* 12/17/2014 1048   GFRAA 66 10/23/2013 1007    No results found for: SPEP, UPEP  Lab Results  Component Value Date   WBC 2.9* 11/03/2015   NEUTROABS 1.2* 11/03/2015   HGB 8.9* 11/03/2015   HCT 28.4* 11/03/2015   MCV 89.7 11/03/2015   PLT 99* 11/03/2015      Chemistry      Component Value Date/Time   NA 139 12/17/2014 1048   K 5.2* 12/17/2014 1048   CL 108 12/17/2014 1048   CO2 25 12/17/2014 1048   BUN 30* 12/17/2014 1048   CREATININE 2.19* 12/17/2014 1048   CREATININE 1.38* 12/31/2013 1100      Component Value Date/Time   CALCIUM 8.5* 12/17/2014 1048   ALKPHOS 74 06/10/2014 1047   AST 45* 06/10/2014 1047   ALT 26 06/10/2014 1047   BILITOT 0.6 06/10/2014 1047     ASSESSMENT & PLAN:  Pancytopenia, acquired ([place]) We discussed the role for bone marrow aspirate and biopsy and he agreed to proceed. Currently, he is asymptomatic. Bone Marrow Biopsy and Aspiration Procedure Note   Informed consent was obtained and potential risks including bleeding, infection and pain were reviewed with the patient. The patient's name, date of birth, identification, consent and allergies were verified prior to the start of procedure and time out was performed.  The right posterior iliac crest was  chosen as the site of biopsy.  The skin was prepped with Chloraprep solution.   4 cc of 1% lidocaine was used to provide local anaesthesia.   10 cc of bone marrow aspirate was obtained followed by 1 inch biopsy.   The procedure was tolerated well and there were no complications.  The patient was stable at the end of the procedure.  Specimens sent for flow cytometry, cytogenetics and additional studies.     All questions were answered. The patient knows to call the clinic with any problems, questions or concerns. No barriers to learning was detected.  I spent 20 minutes counseling the patient face to face. The total time spent in the appointment was 25 minutes and more than 50% was on counseling.     Saidi Santacroce, MD 7/12/20179:09 AM    Addendum: Serum potassium came back high with no visible hemolysis. I  told the nurse to give him instruction to stop lisinopril, start him on Kayexalate and repeat potassium level in 2 days

## 2015-11-04 LAB — ERYTHROPOIETIN: ERYTHROPOIETIN: 9.7 m[IU]/mL (ref 2.6–18.5)

## 2015-11-04 LAB — SEDIMENTATION RATE: SED RATE: 12 mm/h (ref 0–30)

## 2015-11-04 LAB — FIBRINOGEN: FIBRINOGEN: 208 mg/dL (ref 193–507)

## 2015-11-05 ENCOUNTER — Other Ambulatory Visit: Payer: Self-pay | Admitting: Hematology and Oncology

## 2015-11-05 ENCOUNTER — Telehealth: Payer: Self-pay | Admitting: *Deleted

## 2015-11-05 ENCOUNTER — Encounter: Payer: Self-pay | Admitting: Hematology and Oncology

## 2015-11-05 ENCOUNTER — Other Ambulatory Visit (HOSPITAL_BASED_OUTPATIENT_CLINIC_OR_DEPARTMENT_OTHER): Payer: Medicare Other

## 2015-11-05 DIAGNOSIS — D61818 Other pancytopenia: Secondary | ICD-10-CM | POA: Diagnosis not present

## 2015-11-05 DIAGNOSIS — E875 Hyperkalemia: Secondary | ICD-10-CM

## 2015-11-05 DIAGNOSIS — Z1159 Encounter for screening for other viral diseases: Secondary | ICD-10-CM

## 2015-11-05 HISTORY — DX: Encounter for screening for other viral diseases: Z11.59

## 2015-11-05 LAB — POTASSIUM (CC13): Potassium: 5.6 mEq/L — ABNORMAL HIGH (ref 3.5–5.1)

## 2015-11-05 MED ORDER — SODIUM POLYSTYRENE SULFONATE 15 GM/60ML PO SUSP: 15.0000 g | Freq: Every day | ORAL | Status: DC

## 2015-11-05 NOTE — Telephone Encounter (Signed)
LVM for pt on his phone.  Unable to reach his brother.  Was able to reach his daughter and informed her of Dr. Calton Dach message and Rx for Kayexalate sent to his pharmacy.  She verbalized understanding and will make sure he gets his Medication and knows about lab on Monday, continues to hold lisinopril and avoid potassium rich foods.

## 2015-11-05 NOTE — Telephone Encounter (Signed)
-----   Message from Heath Lark, MD sent at 11/05/2015 12:00 PM EDT ----- Regarding: blood test Pls call in kayexalate 15 mg PO daily x 7 days, no refill Please tell him not to eat pickles, orange juice or potassium rich diet like sweet potatoes Continue to hold lisinopril He has to come back again Monday for blood draw. I will place POF ----- Message -----    From: [facility] Interface    Sent: 11/05/2015  11:51 AM      To: Heath Lark, MD

## 2015-11-06 LAB — HEPATITIS C ANTIBODY: Hep C Virus Ab: <0.1 s/co ratio (ref 0.0–0.9)

## 2015-11-07 ENCOUNTER — Other Ambulatory Visit (HOSPITAL_COMMUNITY): Payer: Self-pay | Admitting: Adult Health

## 2015-11-08 ENCOUNTER — Other Ambulatory Visit (HOSPITAL_BASED_OUTPATIENT_CLINIC_OR_DEPARTMENT_OTHER): Payer: Medicare Other

## 2015-11-08 ENCOUNTER — Telehealth: Payer: Self-pay | Admitting: *Deleted

## 2015-11-08 DIAGNOSIS — E875 Hyperkalemia: Secondary | ICD-10-CM | POA: Diagnosis not present

## 2015-11-08 DIAGNOSIS — D61818 Other pancytopenia: Secondary | ICD-10-CM

## 2015-11-08 LAB — POTASSIUM (CC13): POTASSIUM: 4.8 meq/L (ref 3.5–5.1)

## 2015-11-08 LAB — ANTINUCLEAR ANTIBODIES, IFA: ANTINUCLEAR ANTIBODIES, IFA: NEGATIVE

## 2015-11-08 NOTE — Telephone Encounter (Signed)
-----   Message from Heath Lark, MD sent at 11/08/2015 11:30 AM EDT ----- Regarding: potassium level Pls let him know labs OK now Continue to hold lisinopril No need kayexalate See him next week  ----- Message -----    From: [facility]    Sent: 11/08/2015  11:26 AM      To: Heath Lark, MD

## 2015-11-08 NOTE — Telephone Encounter (Signed)
Pt notified of note below. Verbalized understanding

## 2015-11-09 DIAGNOSIS — I69354 Hemiplegia and hemiparesis following cerebral infarction affecting left non-dominant side: Secondary | ICD-10-CM | POA: Diagnosis not present

## 2015-11-09 DIAGNOSIS — M4800 Spinal stenosis, site unspecified: Secondary | ICD-10-CM | POA: Diagnosis not present

## 2015-11-09 DIAGNOSIS — I110 Hypertensive heart disease with heart failure: Secondary | ICD-10-CM | POA: Diagnosis not present

## 2015-11-09 DIAGNOSIS — J449 Chronic obstructive pulmonary disease, unspecified: Secondary | ICD-10-CM | POA: Diagnosis not present

## 2015-11-09 DIAGNOSIS — I482 Chronic atrial fibrillation: Secondary | ICD-10-CM | POA: Diagnosis not present

## 2015-11-09 DIAGNOSIS — I5022 Chronic systolic (congestive) heart failure: Secondary | ICD-10-CM | POA: Diagnosis not present

## 2015-11-10 LAB — CHROMOSOME ANALYSIS, BONE MARROW

## 2015-11-10 LAB — TISSUE HYBRIDIZATION (BONE MARROW)-NCBH

## 2015-11-12 DIAGNOSIS — I69354 Hemiplegia and hemiparesis following cerebral infarction affecting left non-dominant side: Secondary | ICD-10-CM | POA: Diagnosis not present

## 2015-11-12 DIAGNOSIS — I482 Chronic atrial fibrillation: Secondary | ICD-10-CM | POA: Diagnosis not present

## 2015-11-12 DIAGNOSIS — I5022 Chronic systolic (congestive) heart failure: Secondary | ICD-10-CM | POA: Diagnosis not present

## 2015-11-12 DIAGNOSIS — J449 Chronic obstructive pulmonary disease, unspecified: Secondary | ICD-10-CM | POA: Diagnosis not present

## 2015-11-12 DIAGNOSIS — I110 Hypertensive heart disease with heart failure: Secondary | ICD-10-CM | POA: Diagnosis not present

## 2015-11-12 DIAGNOSIS — M4800 Spinal stenosis, site unspecified: Secondary | ICD-10-CM | POA: Diagnosis not present

## 2015-11-15 ENCOUNTER — Encounter: Payer: Self-pay | Admitting: Hematology and Oncology

## 2015-11-15 ENCOUNTER — Ambulatory Visit (HOSPITAL_BASED_OUTPATIENT_CLINIC_OR_DEPARTMENT_OTHER): Payer: Medicare Other | Admitting: Hematology and Oncology

## 2015-11-15 ENCOUNTER — Telehealth: Payer: Self-pay | Admitting: Hematology and Oncology

## 2015-11-15 VITALS — BP 136/36 | HR 49 | Temp 98.1°F | Resp 18 | Ht 70.0 in | Wt 151.7 lb

## 2015-11-15 DIAGNOSIS — N183 Chronic kidney disease, stage 3 unspecified: Secondary | ICD-10-CM | POA: Insufficient documentation

## 2015-11-15 DIAGNOSIS — Z7901 Long term (current) use of anticoagulants: Secondary | ICD-10-CM | POA: Diagnosis not present

## 2015-11-15 DIAGNOSIS — I739 Peripheral vascular disease, unspecified: Secondary | ICD-10-CM | POA: Diagnosis not present

## 2015-11-15 DIAGNOSIS — K7460 Unspecified cirrhosis of liver: Secondary | ICD-10-CM

## 2015-11-15 DIAGNOSIS — E875 Hyperkalemia: Secondary | ICD-10-CM

## 2015-11-15 DIAGNOSIS — D61818 Other pancytopenia: Secondary | ICD-10-CM

## 2015-11-15 DIAGNOSIS — D509 Iron deficiency anemia, unspecified: Secondary | ICD-10-CM | POA: Insufficient documentation

## 2015-11-15 NOTE — Telephone Encounter (Signed)
Gave pt cal & avs °

## 2015-11-15 NOTE — Progress Notes (Signed)
[place], [place] SUMMARY OF HEMATOLOGIC HISTORY:  [name] 65 y.o. male was seen on 11/01/15 because of pancytopenia. The patient is here today accompanied by his brother. The patient has significant major comorbidities including history of stroke causing persistent left-sided weakness, chronic respiratory failure secondary to COPD on chronic oxygen therapy for the past 4 years, chronic atrial fibrillation and significant peripheral vascular disease on chronic anticoagulation therapy and chronic kidney disease. I had the opportunity to review his blood work from 2010. At the time, he has normal CBC. Starting around the end of 2014, he started to develop progressive anemia. Subsequently, starting around May 2016, he started to have progressive thrombocytopenia. According to scanned medical records, he had multiple blood tests done at close interval since September 2016. WBC has reduced from the 3 range down to 2.8 most recently on 10/11/2015. His hemoglobin has drifted down to 8.5 with high MCV and platelet count has drifted down to 85,000 Serum folate and vitamin B-12 were within normal limits. He had iron studies done with serum ferritin at 60. The patient has been placed on iron supplement for some time without significant benefit. On 11/03/2015, he had bone marrow aspirate and biopsy which excluded myelodysplastic syndrome INTERVAL HISTORY: [name] 65 y.o. male returns for further follow-up. He is here today with his daughter. He complained of fatigue. The patient denies any recent signs or symptoms of bleeding such as spontaneous epistaxis, hematuria or hematochezia.  I have reviewed the past medical history, past surgical history, social history and family history with the patient and they are unchanged from previous note.  ALLERGIES:  has No Known Allergies.  MEDICATIONS:  Current Outpatient Prescriptions   Medication Sig Dispense Refill  . albuterol (PROVENTIL HFA;VENTOLIN HFA) 108 (90 BASE) MCG/ACT inhaler Inhale 2 puffs into the lungs every 6 (six) hours as needed for wheezing or shortness of breath. 1 Inhaler 2  . amiodarone (PACERONE) 200 MG tablet TAKE 1 TABLET (200 MG TOTAL) BY MOUTH DAILY. 30 tablet 6  . apixaban (ELIQUIS) 5 MG TABS tablet Take 1 tablet (5 mg total) by mouth 2 (two) times daily. 180 tablet 3  . atorvastatin (LIPITOR) 80 MG tablet TAKE 1 TABLET EVERY DAY AT 6PM 30 tablet 2  . carvedilol (COREG) 25 MG tablet Take 0.5 tablets (12.5 mg total) by mouth 2 (two) times daily. 30 tablet 8  . Fe Fum-FePoly-Vit C-Vit B3 (INTEGRA) 62.5-62.5-40-3 MG CAPS Take 1 capsule by mouth every other day.   4  . lisinopril (PRINIVIL,ZESTRIL) 2.5 MG tablet TAKE 1 TABLET (2.5 MG TOTAL) BY MOUTH DAILY. 30 tablet 6  . OXYGEN-HELIUM IN Inhale 1-2 L into the lungs continuous. Is in the habit of not using his oxygen when he leaves the house. Uses 1L continuous normally Uses 2L for exertion    . pantoprazole (PROTONIX) 40 MG tablet TAKE 1 TABLET (40 MG TOTAL) BY MOUTH DAILY AS NEEDED (INDIGESTION/REFLUX). 30 tablet 3  . PRESCRIPTION MEDICATION Apply 1 application topically daily.    Marland Kitchen tiZANidine (ZANAFLEX) 4 MG tablet Take 1 tablet by mouth 2 (two) times daily.  5  . traMADol (ULTRAM) 50 MG tablet Take 1 tablet (50 mg total) by mouth 4 (four) times daily. 756 tablet 5  . folic acid (FOLVITE) 1 MG tablet Take 1 tablet (1 mg total) by mouth daily. (Patient not taking: Reported on 11/15/2015) 30 tablet 6   No current facility-administered medications for this visit.  REVIEW OF SYSTEMS:   Constitutional: Denies fevers, chills or night sweats Eyes: Denies blurriness of vision Ears, nose, mouth, throat, and face: Denies mucositis or sore throat Respiratory: Denies cough, dyspnea or wheezes Cardiovascular: Denies palpitation, chest discomfort or lower extremity swelling Gastrointestinal:  Denies  nausea, heartburn or change in bowel habits Skin: Denies abnormal skin rashes Lymphatics: Denies new lymphadenopathy or easy bruising Neurological:Denies numbness, tingling or new weaknesses Behavioral/Psych: Mood is stable, no new changes  All other systems were reviewed with the patient and are negative.  PHYSICAL EXAMINATION: ECOG PERFORMANCE STATUS: 0 - Asymptomatic  Vitals:   11/15/15 1144  BP: (!) 136/36  Pulse: (!) 49  Resp: 18  Temp: 98.1 F (36.7 C)   Filed Weights   11/15/15 1144  Weight: 151 lb 11.2 oz (68.8 kg)    GENERAL:alert, no distress and comfortable SKIN: skin color, texture, turgor are normal, no rashes or significant lesions EYES: normal, Conjunctiva are pink and non-injected, sclera clear Musculoskeletal:no cyanosis of digits and no clubbing  NEURO: alert & oriented x 3 with fluent speech, no focal motor/sensory deficits  LABORATORY DATA:  I have reviewed the data as listed     Component Value Date/Time   NA 141 11/03/2015 0842   K 4.8 11/08/2015 1101   CL 108 12/17/2014 1048   CO2 22 11/03/2015 0842   GLUCOSE 84 11/03/2015 0842   BUN 30.8 (H) 11/03/2015 0842   CREATININE 1.9 (H) 11/03/2015 0842   CALCIUM 8.9 11/03/2015 0842   PROT 6.4 11/03/2015 0842   ALBUMIN 2.8 (L) 11/03/2015 0842   AST 51 (H) 11/03/2015 0842   ALT 31 11/03/2015 0842   ALKPHOS 78 11/03/2015 0842   BILITOT 0.33 11/03/2015 0842   GFRNONAA 30 (L) 12/17/2014 1048   GFRNONAA 57 (L) 10/23/2013 1007   GFRAA 35 (L) 12/17/2014 1048   GFRAA 66 10/23/2013 1007    No results found for: SPEP, UPEP  Lab Results  Component Value Date   WBC 2.9 (L) 11/03/2015   NEUTROABS 1.2 (L) 11/03/2015   HGB 8.9 (L) 11/03/2015   HCT 28.4 (L) 11/03/2015   MCV 89.7 11/03/2015   PLT 99 (L) 11/03/2015      Chemistry      Component Value Date/Time   NA 141 11/03/2015 0842   K 4.8 11/08/2015 1101   CL 108 12/17/2014 1048   CO2 22 11/03/2015 0842   BUN 30.8 (H) 11/03/2015 0842    CREATININE 1.9 (H) 11/03/2015 0842      Component Value Date/Time   CALCIUM 8.9 11/03/2015 0842   ALKPHOS 78 11/03/2015 0842   AST 51 (H) 11/03/2015 0842   ALT 31 11/03/2015 0842   BILITOT 0.33 11/03/2015 0842       RADIOGRAPHIC STUDIES: I have personally reviewed the radiological images as listed and agreed with the findings in the report. No results found.  ASSESSMENT & PLAN:  Pancytopenia, acquired ([place]) His bone marrow biopsy showed no signs of myelodysplastic syndrome or lymphoma. It is conceivable that he had anemia due to iron deficiency from bleeding. The leukopenia and thrombocytopenia could be due to sequestration from probable splenomegaly. His daughter believes that due to his history of significant alcoholism many years ago, it is plausible that the patient may have undiagnosed liver cirrhosis. He does not need blood transfusion for anemia. I recommend oral iron supplement for now pending further workup. If he does not respond to oral iron therapy, I would prescribe intravenous iron for him next month. I  recommend pursuing CT scan of the abdomen to exclude liver cirrhosis and to evaluate for splenomegaly. Due to his chronic kidney disease, I recommend CT scan with oral contrast only   Chronic anticoagulation The patient is on chronic anticoagulation therapy due to history of peripheral vascular disease and stroke. We balanced the risk and benefit of continuing anticoagulation therapy even if he has signs of bleeding. Decision is made for him to continue for now  Chronic kidney disease, stage III (moderate) The patient has chronic kidney disease stage III. I recommend nephrology consultation. His recent hyperkalemia could be due to medication side effect from lisinopril or advanced disease. For now, I recommend he discontinued lisinopril. His blood pressure is under control without lisinopril  Hyperkalemia The patient had recent hyperkalemia. With multiple doses  of Kayexalate, hydration and discontinuation of lisinopril, hyperkalemia had resolved  Orders Placed This Encounter  Procedures  . CT ABDOMEN WO CONTRAST    Standing Status:   Future    Standing Expiration Date:   02/14/2017    Order Specific Question:   Reason for exam:    Answer:   suspect liver cirrhosis, pancytopenia    Order Specific Question:   Preferred imaging location?    Answer:   [facility]  . CBC & Diff and Retic    Standing Status:   Future    Standing Expiration Date:   02/14/2017  . Comprehensive metabolic panel    Standing Status:   Future    Standing Expiration Date:   02/14/2017  . Ferritin    Standing Status:   Future    Standing Expiration Date:   02/14/2017  . Ambulatory referral to Nephrology    Referral Priority:   Routine    Referral Type:   Consultation    Referral Reason:   Specialty Services Required    Requested Specialty:   Nephrology    Number of Visits Requested:   1     All questions were answered. The patient knows to call the clinic with any problems, questions or concerns. No barriers to learning was detected.  I spent 20 minutes counseling the patient face to face. The total time spent in the appointment was 25 minutes and more than 50% was on counseling.     [facility], Gabbie Marzo, MD 7/24/20174:02 PM

## 2015-11-16 DIAGNOSIS — I110 Hypertensive heart disease with heart failure: Secondary | ICD-10-CM | POA: Diagnosis not present

## 2015-11-16 DIAGNOSIS — I69354 Hemiplegia and hemiparesis following cerebral infarction affecting left non-dominant side: Secondary | ICD-10-CM | POA: Diagnosis not present

## 2015-11-16 DIAGNOSIS — I5022 Chronic systolic (congestive) heart failure: Secondary | ICD-10-CM | POA: Diagnosis not present

## 2015-11-16 DIAGNOSIS — M4800 Spinal stenosis, site unspecified: Secondary | ICD-10-CM | POA: Diagnosis not present

## 2015-11-16 DIAGNOSIS — I482 Chronic atrial fibrillation: Secondary | ICD-10-CM | POA: Diagnosis not present

## 2015-11-16 DIAGNOSIS — J449 Chronic obstructive pulmonary disease, unspecified: Secondary | ICD-10-CM | POA: Diagnosis not present

## 2015-11-16 NOTE — Assessment & Plan Note (Signed)
His bone marrow biopsy showed no signs of myelodysplastic syndrome or lymphoma. It is conceivable that he had anemia due to iron deficiency from bleeding. The leukopenia and thrombocytopenia could be due to sequestration from probable splenomegaly. His daughter believes that due to his history of significant alcoholism many years ago, it is plausible that the patient may have undiagnosed liver cirrhosis. He does not need blood transfusion for anemia. I recommend oral iron supplement for now pending further workup. If he does not respond to oral iron therapy, I would prescribe intravenous iron for him next month. I recommend pursuing CT scan of the abdomen to exclude liver cirrhosis and to evaluate for splenomegaly. Due to his chronic kidney disease, I recommend CT scan with oral contrast only

## 2015-11-16 NOTE — Assessment & Plan Note (Signed)
The patient is on chronic anticoagulation therapy due to history of peripheral vascular disease and stroke. We balanced the risk and benefit of continuing anticoagulation therapy even if he has signs of bleeding. Decision is made for him to continue for now

## 2015-11-16 NOTE — Assessment & Plan Note (Signed)
The patient has chronic kidney disease stage III. I recommend nephrology consultation. His recent hyperkalemia could be due to medication side effect from lisinopril or advanced disease. For now, I recommend he discontinued lisinopril. His blood pressure is under control without lisinopril

## 2015-11-16 NOTE — Assessment & Plan Note (Signed)
The patient had recent hyperkalemia. With multiple doses of Kayexalate, hydration and discontinuation of lisinopril, hyperkalemia had resolved

## 2015-11-18 ENCOUNTER — Encounter (HOSPITAL_COMMUNITY): Payer: Self-pay

## 2015-11-19 ENCOUNTER — Ambulatory Visit: Payer: Self-pay | Admitting: Physical Medicine & Rehabilitation

## 2015-11-19 ENCOUNTER — Ambulatory Visit: Payer: Self-pay

## 2015-11-19 DIAGNOSIS — J449 Chronic obstructive pulmonary disease, unspecified: Secondary | ICD-10-CM | POA: Diagnosis not present

## 2015-11-19 DIAGNOSIS — I482 Chronic atrial fibrillation: Secondary | ICD-10-CM | POA: Diagnosis not present

## 2015-11-19 DIAGNOSIS — I5022 Chronic systolic (congestive) heart failure: Secondary | ICD-10-CM | POA: Diagnosis not present

## 2015-11-19 DIAGNOSIS — M4800 Spinal stenosis, site unspecified: Secondary | ICD-10-CM | POA: Diagnosis not present

## 2015-11-19 DIAGNOSIS — I110 Hypertensive heart disease with heart failure: Secondary | ICD-10-CM | POA: Diagnosis not present

## 2015-11-19 DIAGNOSIS — I69354 Hemiplegia and hemiparesis following cerebral infarction affecting left non-dominant side: Secondary | ICD-10-CM | POA: Diagnosis not present

## 2015-11-23 DIAGNOSIS — I110 Hypertensive heart disease with heart failure: Secondary | ICD-10-CM | POA: Diagnosis not present

## 2015-11-23 DIAGNOSIS — I482 Chronic atrial fibrillation: Secondary | ICD-10-CM | POA: Diagnosis not present

## 2015-11-23 DIAGNOSIS — I69354 Hemiplegia and hemiparesis following cerebral infarction affecting left non-dominant side: Secondary | ICD-10-CM | POA: Diagnosis not present

## 2015-11-23 DIAGNOSIS — J449 Chronic obstructive pulmonary disease, unspecified: Secondary | ICD-10-CM | POA: Diagnosis not present

## 2015-11-23 DIAGNOSIS — I5022 Chronic systolic (congestive) heart failure: Secondary | ICD-10-CM | POA: Diagnosis not present

## 2015-11-23 DIAGNOSIS — M4800 Spinal stenosis, site unspecified: Secondary | ICD-10-CM | POA: Diagnosis not present

## 2015-11-25 ENCOUNTER — Encounter: Payer: Self-pay | Admitting: *Deleted

## 2015-11-26 ENCOUNTER — Encounter: Payer: Self-pay | Admitting: *Deleted

## 2015-11-26 DIAGNOSIS — J449 Chronic obstructive pulmonary disease, unspecified: Secondary | ICD-10-CM | POA: Diagnosis not present

## 2015-11-26 DIAGNOSIS — I110 Hypertensive heart disease with heart failure: Secondary | ICD-10-CM | POA: Diagnosis not present

## 2015-11-26 DIAGNOSIS — M4800 Spinal stenosis, site unspecified: Secondary | ICD-10-CM | POA: Diagnosis not present

## 2015-11-26 DIAGNOSIS — I482 Chronic atrial fibrillation: Secondary | ICD-10-CM | POA: Diagnosis not present

## 2015-11-26 DIAGNOSIS — I69354 Hemiplegia and hemiparesis following cerebral infarction affecting left non-dominant side: Secondary | ICD-10-CM | POA: Diagnosis not present

## 2015-11-26 DIAGNOSIS — I5022 Chronic systolic (congestive) heart failure: Secondary | ICD-10-CM | POA: Diagnosis not present

## 2015-11-30 DIAGNOSIS — I69354 Hemiplegia and hemiparesis following cerebral infarction affecting left non-dominant side: Secondary | ICD-10-CM | POA: Diagnosis not present

## 2015-11-30 DIAGNOSIS — J449 Chronic obstructive pulmonary disease, unspecified: Secondary | ICD-10-CM | POA: Diagnosis not present

## 2015-11-30 DIAGNOSIS — I482 Chronic atrial fibrillation: Secondary | ICD-10-CM | POA: Diagnosis not present

## 2015-11-30 DIAGNOSIS — I110 Hypertensive heart disease with heart failure: Secondary | ICD-10-CM | POA: Diagnosis not present

## 2015-11-30 DIAGNOSIS — M4800 Spinal stenosis, site unspecified: Secondary | ICD-10-CM | POA: Diagnosis not present

## 2015-11-30 DIAGNOSIS — I5022 Chronic systolic (congestive) heart failure: Secondary | ICD-10-CM | POA: Diagnosis not present

## 2015-12-02 ENCOUNTER — Other Ambulatory Visit: Payer: Self-pay | Admitting: Hematology and Oncology

## 2015-12-03 DIAGNOSIS — I110 Hypertensive heart disease with heart failure: Secondary | ICD-10-CM | POA: Diagnosis not present

## 2015-12-03 DIAGNOSIS — I69354 Hemiplegia and hemiparesis following cerebral infarction affecting left non-dominant side: Secondary | ICD-10-CM | POA: Diagnosis not present

## 2015-12-03 DIAGNOSIS — M4800 Spinal stenosis, site unspecified: Secondary | ICD-10-CM | POA: Diagnosis not present

## 2015-12-03 DIAGNOSIS — I482 Chronic atrial fibrillation: Secondary | ICD-10-CM | POA: Diagnosis not present

## 2015-12-03 DIAGNOSIS — I5022 Chronic systolic (congestive) heart failure: Secondary | ICD-10-CM | POA: Diagnosis not present

## 2015-12-03 DIAGNOSIS — J449 Chronic obstructive pulmonary disease, unspecified: Secondary | ICD-10-CM | POA: Diagnosis not present

## 2015-12-15 ENCOUNTER — Encounter (HOSPITAL_COMMUNITY): Payer: Self-pay

## 2015-12-15 ENCOUNTER — Ambulatory Visit (HOSPITAL_COMMUNITY)
Admission: RE | Admit: 2015-12-15 | Discharge: 2015-12-15 | Disposition: A | Discharge status: Home / Self Care | Payer: Medicare Other | Source: Ambulatory Visit | Attending: Hematology and Oncology | Admitting: Hematology and Oncology

## 2015-12-15 ENCOUNTER — Other Ambulatory Visit (HOSPITAL_BASED_OUTPATIENT_CLINIC_OR_DEPARTMENT_OTHER): Payer: Medicare Other

## 2015-12-15 DIAGNOSIS — N183 Chronic kidney disease, stage 3 unspecified: Secondary | ICD-10-CM

## 2015-12-15 DIAGNOSIS — K7460 Unspecified cirrhosis of liver: Secondary | ICD-10-CM

## 2015-12-15 DIAGNOSIS — D509 Iron deficiency anemia, unspecified: Secondary | ICD-10-CM

## 2015-12-15 DIAGNOSIS — D61818 Other pancytopenia: Secondary | ICD-10-CM | POA: Diagnosis not present

## 2015-12-15 DIAGNOSIS — I700 Atherosclerosis of aorta: Secondary | ICD-10-CM | POA: Insufficient documentation

## 2015-12-15 DIAGNOSIS — Z95828 Presence of other vascular implants and grafts: Secondary | ICD-10-CM | POA: Diagnosis not present

## 2015-12-15 DIAGNOSIS — I2510 Atherosclerotic heart disease of native coronary artery without angina pectoris: Secondary | ICD-10-CM | POA: Diagnosis not present

## 2015-12-15 DIAGNOSIS — K8020 Calculus of gallbladder without cholecystitis without obstruction: Secondary | ICD-10-CM | POA: Insufficient documentation

## 2015-12-15 LAB — COMPREHENSIVE METABOLIC PANEL
ALBUMIN: 2.9 g/dL — AB (ref 3.5–5.0)
ALK PHOS: 82 U/L (ref 40–150)
ALT: 33 U/L (ref 0–55)
ANION GAP: 7 meq/L (ref 3–11)
AST: 56 U/L — ABNORMAL HIGH (ref 5–34)
BILIRUBIN TOTAL: 0.38 mg/dL (ref 0.20–1.20)
BUN: 28.5 mg/dL — ABNORMAL HIGH (ref 7.0–26.0)
CALCIUM: 8.9 mg/dL (ref 8.4–10.4)
CO2: 24 mEq/L (ref 22–29)
Chloride: 109 mEq/L (ref 98–109)
Creatinine: 1.6 mg/dL — ABNORMAL HIGH (ref 0.7–1.3)
EGFR: 44 mL/min/{1.73_m2} — AB (ref >90)
GLUCOSE: 82 mg/dL (ref 70–140)
Potassium: 5.1 mEq/L (ref 3.5–5.1)
SODIUM: 140 meq/L (ref 136–145)
TOTAL PROTEIN: 6.9 g/dL (ref 6.4–8.3)

## 2015-12-15 LAB — CBC & DIFF AND RETIC
BASO%: 0.9 % (ref 0.0–2.0)
Basophils Absolute: 0 10*3/uL (ref 0.0–0.1)
EOS%: 4.7 % (ref 0.0–7.0)
Eosinophils Absolute: 0.2 10*3/uL (ref 0.0–0.5)
HCT: 29.5 % — ABNORMAL LOW (ref 38.4–49.9)
HGB: 9.2 g/dL — ABNORMAL LOW (ref 13.0–17.1)
Immature Retic Fract: 3.4 % (ref 3.00–10.60)
LYMPH#: 1 10*3/uL (ref 0.9–3.3)
LYMPH%: 30.4 % (ref 14.0–49.0)
MCH: 28.7 pg (ref 27.2–33.4)
MCHC: 31.2 g/dL — ABNORMAL LOW (ref 32.0–36.0)
MCV: 91.9 fL (ref 79.3–98.0)
MONO#: 0.6 10*3/uL (ref 0.1–0.9)
MONO%: 17.1 % — ABNORMAL HIGH (ref 0.0–14.0)
NEUT%: 46.9 % (ref 39.0–75.0)
NEUTROS ABS: 1.5 10*3/uL (ref 1.5–6.5)
PLATELETS: 111 10*3/uL — AB (ref 140–400)
RBC: 3.21 10*6/uL — ABNORMAL LOW (ref 4.20–5.82)
RDW: 14.9 % — ABNORMAL HIGH (ref 11.0–14.6)
Retic %: 2.09 % — ABNORMAL HIGH (ref 0.80–1.80)
Retic Ct Abs: 67.09 10*3/uL (ref 34.80–93.90)
WBC: 3.2 10*3/uL — AB (ref 4.0–10.3)

## 2015-12-15 LAB — FERRITIN: Ferritin: 71 ng/ml (ref 22–316)

## 2015-12-15 NOTE — Progress Notes (Signed)
[place], MD SUMMARY OF HEMATOLOGIC HISTORY:  [name] 65 y.o. male was seen on 11/01/15 because of pancytopenia. The patient is here today accompanied by his brother. The patient has significant major comorbidities including history of stroke causing persistent left-sided weakness, chronic respiratory failure secondary to COPD on chronic oxygen therapy for the past 4 years, chronic atrial fibrillation and significant peripheral vascular disease on chronic anticoagulation therapy and chronic kidney disease. I had the opportunity to review his blood work from 2010. At the time, he has normal CBC. Starting around the end of 2014, he started to develop progressive anemia. Subsequently, starting around May 2016, he started to have progressive thrombocytopenia. According to scanned medical records, he had multiple blood tests done at close interval since September 2016. WBC has reduced from the 3 range down to 2.8 most recently on 10/11/2015. His hemoglobin has drifted down to 8.5 with high MCV and platelet count has drifted down to 85,000 Serum folate and vitamin B-12 were within normal limits. He had iron studies done with serum ferritin at 60. The patient has been placed on iron supplement for some time without significant benefit. On 11/03/2015, he had bone marrow aspirate and biopsy which excluded myelodysplastic syndrome On 12/15/15, CT chest, abdomen and pelvis showed no CT findings to suggest cirrhosis.    INTERVAL HISTORY: [name] 65 y.o. male returns for follow-up. He feels well. The patient denies any recent signs or symptoms of bleeding such as spontaneous epistaxis, hematuria or hematochezia. He denies recent infection  I have reviewed the past medical history, past surgical history, social history and family history with the patient and they are unchanged from previous note.  ALLERGIES:  has No Known  Allergies.  MEDICATIONS:  Current Outpatient Prescriptions  Medication Sig Dispense Refill  . amiodarone (PACERONE) 200 MG tablet TAKE 1 TABLET (200 MG TOTAL) BY MOUTH DAILY. 30 tablet 6  . apixaban (ELIQUIS) 5 MG TABS tablet Take 1 tablet (5 mg total) by mouth 2 (two) times daily. 180 tablet 3  . atorvastatin (LIPITOR) 80 MG tablet TAKE 1 TABLET EVERY DAY AT 6PM 30 tablet 2  . carvedilol (COREG) 25 MG tablet Take 0.5 tablets (12.5 mg total) by mouth 2 (two) times daily. 30 tablet 8  . Fe Fum-FePoly-Vit C-Vit B3 (INTEGRA) 62.5-62.5-40-3 MG CAPS Take 1 capsule by mouth every other day.   4  . folic acid (FOLVITE) 1 MG tablet Take 1 tablet (1 mg total) by mouth daily. 30 tablet 6  . OXYGEN-HELIUM IN Inhale 1-2 L into the lungs continuous. Is in the habit of not using his oxygen when he leaves the house. Uses 1L continuous normally Uses 2L for exertion    . pantoprazole (PROTONIX) 40 MG tablet TAKE 1 TABLET (40 MG TOTAL) BY MOUTH DAILY AS NEEDED (INDIGESTION/REFLUX). 30 tablet 3  . PRESCRIPTION MEDICATION Apply 1 application topically daily.    Marland Kitchen tiZANidine (ZANAFLEX) 4 MG tablet Take 1 tablet by mouth 2 (two) times daily.  5  . traMADol (ULTRAM) 50 MG tablet Take 1 tablet (50 mg total) by mouth 4 (four) times daily. 120 tablet 5  . albuterol (PROVENTIL HFA;VENTOLIN HFA) 108 (90 BASE) MCG/ACT inhaler Inhale 2 puffs into the lungs every 6 (six) hours as needed for wheezing or shortness of breath. (Patient not taking: Reported on 12/16/2015) 1 Inhaler 2  . lisinopril (PRINIVIL,ZESTRIL) 2.5 MG tablet TAKE 1 TABLET (2.5 MG TOTAL) BY MOUTH DAILY. (Patient  not taking: Reported on 12/16/2015) 30 tablet 6   No current facility-administered medications for this visit.      REVIEW OF SYSTEMS:   Constitutional: Denies fevers, chills or night sweats Eyes: Denies blurriness of vision Ears, nose, mouth, throat, and face: Denies mucositis or sore throat Respiratory: Denies cough, dyspnea or  wheezes Cardiovascular: Denies palpitation, chest discomfort or lower extremity swelling Gastrointestinal:  Denies nausea, heartburn or change in bowel habits Skin: Denies abnormal skin rashes Lymphatics: Denies new lymphadenopathy or easy bruising Neurological:Denies numbness, tingling or new weaknesses Behavioral/Psych: Mood is stable, no new changes  All other systems were reviewed with the patient and are negative.  PHYSICAL EXAMINATION: ECOG PERFORMANCE STATUS: 1 - Symptomatic but completely ambulatory  Vitals:   12/16/15 1053  BP: (!) 125/4  Pulse: (!) 49  Resp: 18  Temp: 98.1 F (36.7 C)   Filed Weights   12/16/15 1053  Weight: 154 lb 6.4 oz (70 kg)    GENERAL:alert, no distress and comfortable SKIN: skin color, texture, turgor are normal, no rashes or significant lesions EYES: normal, Conjunctiva are pink and non-injected, sclera clear Musculoskeletal:no cyanosis of digits and no clubbing  NEURO: alert & oriented x 3 with fluent speech, no focal motor/sensory deficits  LABORATORY DATA:  I have reviewed the data as listed     Component Value Date/Time   NA 140 12/15/2015 0855   K 5.1 12/15/2015 0855   CL 108 12/17/2014 1048   CO2 24 12/15/2015 0855   GLUCOSE 82 12/15/2015 0855   BUN 28.5 (H) 12/15/2015 0855   CREATININE 1.6 (H) 12/15/2015 0855   CALCIUM 8.9 12/15/2015 0855   PROT 6.9 12/15/2015 0855   ALBUMIN 2.9 (L) 12/15/2015 0855   AST 56 (H) 12/15/2015 0855   ALT 33 12/15/2015 0855   ALKPHOS 82 12/15/2015 0855   BILITOT 0.38 12/15/2015 0855   GFRNONAA 30 (L) 12/17/2014 1048   GFRNONAA 57 (L) 10/23/2013 1007   GFRAA 35 (L) 12/17/2014 1048   GFRAA 66 10/23/2013 1007    No results found for: SPEP, UPEP  Lab Results  Component Value Date   WBC 3.2 (L) 12/15/2015   NEUTROABS 1.5 12/15/2015   HGB 9.2 (L) 12/15/2015   HCT 29.5 (L) 12/15/2015   MCV 91.9 12/15/2015   PLT 111 (L) 12/15/2015      Chemistry      Component Value Date/Time   NA 140  12/15/2015 0855   K 5.1 12/15/2015 0855   CL 108 12/17/2014 1048   CO2 24 12/15/2015 0855   BUN 28.5 (H) 12/15/2015 0855   CREATININE 1.6 (H) 12/15/2015 0855      Component Value Date/Time   CALCIUM 8.9 12/15/2015 0855   ALKPHOS 82 12/15/2015 0855   AST 56 (H) 12/15/2015 0855   ALT 33 12/15/2015 0855   BILITOT 0.38 12/15/2015 0855       RADIOGRAPHIC STUDIES: I have personally reviewed the radiological images as listed and agreed with the findings in the report. Ct Abdomen Wo Contrast  Result Date: 12/15/2015 CLINICAL DATA:  Pancytopenia. Evaluate the liver for possible cirrhosis. EXAM: CT ABDOMEN WITHOUT CONTRAST TECHNIQUE: Multidetector CT imaging of the abdomen was performed following the standard protocol without IV contrast. COMPARISON:  CT scan 09/23/2008 FINDINGS: Lower chest: Advanced three-vessel coronary artery calcifications. There are also calcifications involving the aortic valve area. No pericardial effusion. The distal esophagus is grossly normal. No significant pulmonary findings. Dense scarring changes in the right middle lobe. Advanced atherosclerotic calcifications involving  the distal descending thoracic aorta. Hepatobiliary: No focal hepatic lesions are identified. I do not see any obvious morphologic features of cirrhosis. No intrahepatic biliary dilatation. Gallstones are noted in the gallbladder. No common bile duct dilatation. Pancreas: Moderate pancreatic atrophy but no mass, inflammation or ductal dilatation. Spleen: Within normal limits in size.  The no focal lesions. Adrenals/Urinary Tract: The adrenal glands are normal. No worrisome renal lesions or hydronephrosis. No obstructing ureteral calculi are visualized. Extensive renal artery calcifications are noted. Stomach/Bowel: The stomach, duodenum, visualized small bowel and visualized colon are grossly normal. No inflammatory changes, mass lesions or obstructive findings. The appendix is normal. The terminal ileum is  normal. Vascular/Lymphatic: Aortoiliac bypass graft is noted. No complicating features. Extensive calcifications involving the aortic branch vessels. No mesenteric or retroperitoneal mass or lymphadenopathy. Small scattered lymph nodes are noted. Other: No ascites or abdominal wall hernia. Musculoskeletal: No significant bony findings. IMPRESSION: 1. No CT findings to suggest cirrhosis.  No hepatic lesions. 2. Cholelithiasis. 3. Advanced atherosclerotic calcifications involving the distal descending thoracic aorta, coronary arteries and abdominal aortic branch vessels. Infrarenal aortoiliac bypass graft is noted. 4. No acute abdominal findings, mass lesions or lymphadenopathy. Electronically Signed   By: Marijo Sanes M.D.   On: 12/15/2015 12:46    ASSESSMENT & PLAN:  Pancytopenia, acquired ([place]) His bone marrow biopsy showed no signs of myelodysplastic syndrome or lymphoma. It is conceivable that he had anemia due to iron deficiency from bleeding. Since he is barely responding to oral iron therapy, I would prescribe intravenous iron for him  CT scan of the abdomen showed no evidence of liver cirrhosis or splenomegaly. Overall, I think the mild leukopenia and thrombocytopenia could be due to very early signs of myelodysplastic syndrome but he is not symptomatic and would not require treatment for that. In terms of the anemia, I plan to recheck his blood work next month. If his hemoglobin continues to be less than 10, I will prescribe darbepoetin injection for anemia secondary to chronic renal failure The most likely cause of his anemia is due to chronic blood loss/malabsorption syndrome. We discussed some of the risks, benefits, and alternatives of intravenous iron infusions. The patient is symptomatic from anemia and the iron level is critically low. He tolerated oral iron supplement poorly and desires to achieved higher levels of iron faster for adequate hematopoesis. Some of the side-effects to be  expected including risks of infusion reactions, phlebitis, headaches, nausea and fatigue.  The patient is willing to proceed. Patient education material was dispensed.  Goal is to keep ferritin level greater than 100   Chronic kidney disease, stage III (moderate) The patient has chronic kidney disease stage III. I recommend nephrology consultation. His recent hyperkalemia could be due to medication side effect from lisinopril or advanced disease. For now, I recommend he discontinued lisinopril. His blood pressure is under control without lisinopril   All questions were answered. The patient knows to call the clinic with any problems, questions or concerns. No barriers to learning was detected.  I spent 15 minutes counseling the patient face to face. The total time spent in the appointment was 20 minutes and more than 50% was on counseling.     [facility], Zahriyah Joo, MD 8/24/20171:29 PM

## 2015-12-16 ENCOUNTER — Telehealth: Payer: Self-pay | Admitting: Hematology and Oncology

## 2015-12-16 ENCOUNTER — Ambulatory Visit (HOSPITAL_BASED_OUTPATIENT_CLINIC_OR_DEPARTMENT_OTHER): Payer: Medicare Other

## 2015-12-16 ENCOUNTER — Encounter: Payer: Self-pay | Admitting: Hematology and Oncology

## 2015-12-16 ENCOUNTER — Ambulatory Visit (HOSPITAL_BASED_OUTPATIENT_CLINIC_OR_DEPARTMENT_OTHER): Payer: Medicare Other | Admitting: Hematology and Oncology

## 2015-12-16 VITALS — BP 130/38 | HR 46 | Temp 98.3°F | Resp 18

## 2015-12-16 VITALS — BP 125/4 | HR 49 | Temp 98.1°F | Resp 18 | Ht 70.0 in | Wt 154.4 lb

## 2015-12-16 DIAGNOSIS — D509 Iron deficiency anemia, unspecified: Secondary | ICD-10-CM

## 2015-12-16 DIAGNOSIS — N183 Chronic kidney disease, stage 3 unspecified: Secondary | ICD-10-CM

## 2015-12-16 DIAGNOSIS — D61818 Other pancytopenia: Secondary | ICD-10-CM

## 2015-12-16 DIAGNOSIS — D519 Vitamin B12 deficiency anemia, unspecified: Secondary | ICD-10-CM

## 2015-12-16 MED ORDER — SODIUM CHLORIDE 0.9 % IV SOLN
Freq: Once | INTRAVENOUS | Status: AC
  Administered 2015-12-16: 12:00:00 via INTRAVENOUS

## 2015-12-16 MED ORDER — SODIUM CHLORIDE 0.9 % IV SOLN
510.0000 mg | Freq: Once | INTRAVENOUS | Status: AC
  Administered 2015-12-16: 510 mg via INTRAVENOUS
  Filled 2015-12-16: qty 17

## 2015-12-16 NOTE — Assessment & Plan Note (Signed)
The patient has chronic kidney disease stage III. I recommend nephrology consultation. His recent hyperkalemia could be due to medication side effect from lisinopril or advanced disease. For now, I recommend he discontinued lisinopril. His blood pressure is under control without lisinopril

## 2015-12-16 NOTE — Patient Instructions (Signed)
Anemia, Nonspecific Anemia is a condition in which the concentration of red blood cells or hemoglobin in the blood is below normal. Hemoglobin is a substance in red blood cells that carries oxygen to the tissues of the body. Anemia results in not enough oxygen reaching these tissues.  CAUSES  Common causes of anemia include:   Excessive bleeding. Bleeding may be internal or external. This includes excessive bleeding from periods (in women) or from the intestine.   Poor nutrition.   Chronic kidney, thyroid, and liver disease.  Bone marrow disorders that decrease red blood cell production.  Cancer and treatments for cancer.  HIV, AIDS, and their treatments.  Spleen problems that increase red blood cell destruction.  Blood disorders.  Excess destruction of red blood cells due to infection, medicines, and autoimmune disorders. SIGNS AND SYMPTOMS   Minor weakness.   Dizziness.   Headache.  Palpitations.   Shortness of breath, especially with exercise.   Paleness.  Cold sensitivity.  Indigestion.  Nausea.  Difficulty sleeping.  Difficulty concentrating. Symptoms may occur suddenly or they may develop slowly.  DIAGNOSIS  Additional blood tests are often needed. These help your health care provider determine the best treatment. Your health care provider will check your stool for blood and look for other causes of blood loss.  TREATMENT  Treatment varies depending on the cause of the anemia. Treatment can include:   Supplements of iron, vitamin 123456, or folic acid.   Hormone medicines.   A blood transfusion. This may be needed if blood loss is severe.   Hospitalization. This may be needed if there is significant continual blood loss.   Dietary changes.  Spleen removal. HOME CARE INSTRUCTIONS Keep all follow-up appointments. It often takes many weeks to correct anemia, and having your health care provider check on your condition and your response to  treatment is very important. SEEK IMMEDIATE MEDICAL CARE IF:   You develop extreme weakness, shortness of breath, or chest pain.   You become dizzy or have trouble concentrating.  You develop heavy vaginal bleeding.   You develop a rash.   You have bloody or black, tarry stools.   You faint.   You vomit up blood.   You vomit repeatedly.   You have abdominal pain.  You have a fever or persistent symptoms for more than 2-3 days.   You have a fever and your symptoms suddenly get worse.   You are dehydrated.  MAKE SURE YOU:  Understand these instructions.  Will watch your condition.  Will get help right away if you are not doing well or get worse.   This information is not intended to replace advice given to you by your health care provider. Make sure you discuss any questions you have with your health care provider.   Document Released: 05/18/2004 Document Revised: 12/11/2012 Document Reviewed: 10/04/2012 Elsevier Interactive Patient Education 2016 [place] injection What is this medicine? FERUMOXYTOL is an iron complex. Iron is used to make healthy red blood cells, which carry oxygen and nutrients throughout the body. This medicine is used to treat iron deficiency anemia in people with chronic kidney disease. This medicine may be used for other purposes; ask your health care provider or pharmacist if you have questions. What should I tell my health care provider before I take this medicine? They need to know if you have any of these conditions: -anemia not caused by low iron levels -high levels of iron in the blood -magnetic resonance imaging (MRI)  test scheduled -an unusual or allergic reaction to iron, other medicines, foods, dyes, or preservatives -pregnant or trying to get pregnant -breast-feeding How should I use this medicine? This medicine is for injection into a vein. It is given by a health care professional in a hospital or clinic  setting. Talk to your pediatrician regarding the use of this medicine in children. Special care may be needed. Overdosage: If you think you have taken too much of this medicine contact a poison control center or emergency room at once. NOTE: This medicine is only for you. Do not share this medicine with others. What if I miss a dose? It is important not to miss your dose. Call your doctor or health care professional if you are unable to keep an appointment. What may interact with this medicine? This medicine may interact with the following medications: -other iron products This list may not describe all possible interactions. Give your health care provider a list of all the medicines, herbs, non-prescription drugs, or dietary supplements you use. Also tell them if you smoke, drink alcohol, or use illegal drugs. Some items may interact with your medicine. What should I watch for while using this medicine? Visit your doctor or healthcare professional regularly. Tell your doctor or healthcare professional if your symptoms do not start to get better or if they get worse. You may need blood work done while you are taking this medicine. You may need to follow a special diet. Talk to your doctor. Foods that contain iron include: whole grains/cereals, dried fruits, beans, or peas, leafy green vegetables, and organ meats (liver, kidney). What side effects may I notice from receiving this medicine? Side effects that you should report to your doctor or health care professional as soon as possible: -allergic reactions like skin rash, itching or hives, swelling of the face, lips, or tongue -breathing problems -changes in blood pressure -feeling faint or lightheaded, falls -fever or chills -flushing, sweating, or hot feelings -swelling of the ankles or feet Side effects that usually do not require medical attention (Report these to your doctor or health care professional if they continue or are  bothersome.): -diarrhea -headache -nausea, vomiting -stomach pain This list may not describe all possible side effects. Call your doctor for medical advice about side effects. You may report side effects to FDA at 1-800-FDA-1088. Where should I keep my medicine? This drug is given in a hospital or clinic and will not be stored at home. NOTE: This sheet is a summary. It may not cover all possible information. If you have questions about this medicine, talk to your doctor, pharmacist, or health care provider.    2016, Elsevier/Gold Standard. (2011-11-24 15:23:36)

## 2015-12-16 NOTE — Assessment & Plan Note (Addendum)
His bone marrow biopsy showed no signs of myelodysplastic syndrome or lymphoma. It is conceivable that he had anemia due to iron deficiency from bleeding. Since he is barely responding to oral iron therapy, I would prescribe intravenous iron for him  CT scan of the abdomen showed no evidence of liver cirrhosis or splenomegaly. Overall, I think the mild leukopenia and thrombocytopenia could be due to very early signs of myelodysplastic syndrome but he is not symptomatic and would not require treatment for that. In terms of the anemia, I plan to recheck his blood work next month. If his hemoglobin continues to be less than 10, I will prescribe darbepoetin injection for anemia secondary to chronic renal failure The most likely cause of his anemia is due to chronic blood loss/malabsorption syndrome. We discussed some of the risks, benefits, and alternatives of intravenous iron infusions. The patient is symptomatic from anemia and the iron level is critically low. He tolerated oral iron supplement poorly and desires to achieved higher levels of iron faster for adequate hematopoesis. Some of the side-effects to be expected including risks of infusion reactions, phlebitis, headaches, nausea and fatigue.  The patient is willing to proceed. Patient education material was dispensed.  Goal is to keep ferritin level greater than 100

## 2015-12-16 NOTE — Telephone Encounter (Signed)
AVS REPORT AND APPT SCHD GIVEN PER 08/24 LOS. °

## 2015-12-17 ENCOUNTER — Telehealth: Payer: Self-pay | Admitting: *Deleted

## 2015-12-17 NOTE — Telephone Encounter (Signed)
Per desk RN we have moved appt from 8/31 to 9/8.Desk RN to call family

## 2015-12-19 ENCOUNTER — Other Ambulatory Visit (HOSPITAL_COMMUNITY): Payer: Self-pay | Admitting: Internal Medicine

## 2015-12-20 ENCOUNTER — Other Ambulatory Visit: Payer: Self-pay | Admitting: Cardiovascular Disease

## 2015-12-23 ENCOUNTER — Ambulatory Visit: Payer: Self-pay

## 2015-12-31 ENCOUNTER — Ambulatory Visit (HOSPITAL_BASED_OUTPATIENT_CLINIC_OR_DEPARTMENT_OTHER): Payer: Medicare Other

## 2015-12-31 VITALS — BP 136/39 | HR 53 | Temp 97.0°F | Resp 18

## 2015-12-31 DIAGNOSIS — N183 Chronic kidney disease, stage 3 (moderate): Secondary | ICD-10-CM | POA: Diagnosis not present

## 2015-12-31 DIAGNOSIS — D509 Iron deficiency anemia, unspecified: Secondary | ICD-10-CM

## 2015-12-31 DIAGNOSIS — D61818 Other pancytopenia: Secondary | ICD-10-CM

## 2015-12-31 MED ORDER — SODIUM CHLORIDE 0.9 % IV SOLN
510.0000 mg | Freq: Once | INTRAVENOUS | Status: AC
  Administered 2015-12-31: 510 mg via INTRAVENOUS
  Filled 2015-12-31: qty 17

## 2015-12-31 MED ORDER — SODIUM CHLORIDE 0.9 % IV SOLN
Freq: Once | INTRAVENOUS | Status: AC
  Administered 2015-12-31: 12:00:00 via INTRAVENOUS

## 2015-12-31 NOTE — Patient Instructions (Signed)

## 2016-01-03 ENCOUNTER — Other Ambulatory Visit: Payer: Self-pay | Admitting: Hematology and Oncology

## 2016-01-05 DIAGNOSIS — E782 Mixed hyperlipidemia: Secondary | ICD-10-CM | POA: Diagnosis not present

## 2016-01-05 DIAGNOSIS — Z23 Encounter for immunization: Secondary | ICD-10-CM | POA: Diagnosis not present

## 2016-01-05 DIAGNOSIS — D500 Iron deficiency anemia secondary to blood loss (chronic): Secondary | ICD-10-CM | POA: Diagnosis not present

## 2016-01-05 DIAGNOSIS — I5022 Chronic systolic (congestive) heart failure: Secondary | ICD-10-CM | POA: Diagnosis not present

## 2016-01-05 DIAGNOSIS — I2510 Atherosclerotic heart disease of native coronary artery without angina pectoris: Secondary | ICD-10-CM | POA: Diagnosis not present

## 2016-01-17 ENCOUNTER — Other Ambulatory Visit (HOSPITAL_BASED_OUTPATIENT_CLINIC_OR_DEPARTMENT_OTHER): Payer: Medicare Other

## 2016-01-17 ENCOUNTER — Telehealth: Payer: Self-pay | Admitting: *Deleted

## 2016-01-17 ENCOUNTER — Other Ambulatory Visit: Payer: Self-pay | Admitting: Hematology and Oncology

## 2016-01-17 DIAGNOSIS — N183 Chronic kidney disease, stage 3 unspecified: Secondary | ICD-10-CM

## 2016-01-17 DIAGNOSIS — D631 Anemia in chronic kidney disease: Secondary | ICD-10-CM

## 2016-01-17 DIAGNOSIS — D61818 Other pancytopenia: Secondary | ICD-10-CM | POA: Diagnosis not present

## 2016-01-17 DIAGNOSIS — D519 Vitamin B12 deficiency anemia, unspecified: Secondary | ICD-10-CM | POA: Diagnosis not present

## 2016-01-17 DIAGNOSIS — E875 Hyperkalemia: Secondary | ICD-10-CM

## 2016-01-17 DIAGNOSIS — D509 Iron deficiency anemia, unspecified: Secondary | ICD-10-CM

## 2016-01-17 DIAGNOSIS — N189 Chronic kidney disease, unspecified: Secondary | ICD-10-CM

## 2016-01-17 LAB — CBC & DIFF AND RETIC
BASO%: 1.1 % (ref 0.0–2.0)
BASOS ABS: 0 10*3/uL (ref 0.0–0.1)
EOS%: 5.2 % (ref 0.0–7.0)
Eosinophils Absolute: 0.2 10*3/uL (ref 0.0–0.5)
HEMATOCRIT: 32.9 % — AB (ref 38.4–49.9)
HGB: 10.3 g/dL — ABNORMAL LOW (ref 13.0–17.1)
Immature Retic Fract: 1.9 % — ABNORMAL LOW (ref 3.00–10.60)
LYMPH#: 1.2 10*3/uL (ref 0.9–3.3)
LYMPH%: 32.6 % (ref 14.0–49.0)
MCH: 29.6 pg (ref 27.2–33.4)
MCHC: 31.3 g/dL — AB (ref 32.0–36.0)
MCV: 94.5 fL (ref 79.3–98.0)
MONO#: 0.6 10*3/uL (ref 0.1–0.9)
MONO%: 17 % — ABNORMAL HIGH (ref 0.0–14.0)
NEUT#: 1.6 10*3/uL (ref 1.5–6.5)
NEUT%: 44.1 % (ref 39.0–75.0)
PLATELETS: 90 10*3/uL — AB (ref 140–400)
RBC: 3.48 10*6/uL — ABNORMAL LOW (ref 4.20–5.82)
RDW: 16.1 % — ABNORMAL HIGH (ref 11.0–14.6)
RETIC CT ABS: 71.69 10*3/uL (ref 34.80–93.90)
Retic %: 2.06 % — ABNORMAL HIGH (ref 0.80–1.80)
WBC: 3.7 10*3/uL — ABNORMAL LOW (ref 4.0–10.3)

## 2016-01-17 LAB — IRON AND TIBC
%SAT: 25 % (ref 20–55)
Iron: 62 ug/dL (ref 42–163)
TIBC: 251 ug/dL (ref 202–409)
UIBC: 189 ug/dL (ref 117–376)

## 2016-01-17 LAB — POTASSIUM (CC13)

## 2016-01-17 LAB — FERRITIN: Ferritin: 480 ng/ml — ABNORMAL HIGH (ref 22–316)

## 2016-01-17 NOTE — Telephone Encounter (Signed)
-----   Message from Heath Lark, MD sent at 01/17/2016 11:40 AM EDT ----- Regarding: high potassium Pls call him and make sure he is not on potassium supplement and off lisinopril. If so, he needs to take laxatives and make sure he is not constipated

## 2016-01-17 NOTE — Telephone Encounter (Signed)
Informed pt of elevated potassium level and Dr. Calton Dach instructions.  Pt confirms he has not been taking potassium and has not been taking the lisinopril. He has been constipated. Last BM was Saturday.  Instructed pt to take laxatives as needed to have good BM today and every day if needed.  Also instructed to avoid high potassium foods and salt substitutes (which may have potassium).  Pt verbalized understanding.

## 2016-01-18 LAB — VITAMIN B12: Vitamin B12: 710 pg/mL (ref 211–946)

## 2016-01-18 LAB — ERYTHROPOIETIN: ERYTHROPOIETIN: 10.2 m[IU]/mL (ref 2.6–18.5)

## 2016-01-24 ENCOUNTER — Encounter: Payer: Self-pay | Admitting: Hematology and Oncology

## 2016-01-24 ENCOUNTER — Telehealth: Payer: Self-pay | Admitting: *Deleted

## 2016-01-24 ENCOUNTER — Ambulatory Visit: Payer: Self-pay

## 2016-01-24 ENCOUNTER — Telehealth: Payer: Self-pay | Admitting: Hematology and Oncology

## 2016-01-24 ENCOUNTER — Ambulatory Visit (HOSPITAL_BASED_OUTPATIENT_CLINIC_OR_DEPARTMENT_OTHER): Payer: Medicare Other | Admitting: Hematology and Oncology

## 2016-01-24 VITALS — BP 141/45 | HR 51 | Temp 98.2°F | Resp 19 | Wt 152.3 lb

## 2016-01-24 DIAGNOSIS — D61818 Other pancytopenia: Secondary | ICD-10-CM | POA: Diagnosis not present

## 2016-01-24 DIAGNOSIS — D508 Other iron deficiency anemias: Secondary | ICD-10-CM

## 2016-01-24 DIAGNOSIS — N183 Chronic kidney disease, stage 3 unspecified: Secondary | ICD-10-CM

## 2016-01-24 NOTE — Telephone Encounter (Signed)
GAVE PATIENT AVS REPORT AND APPOINTMENTS FOR January  °

## 2016-01-24 NOTE — Telephone Encounter (Signed)
Faxed referral to [facility]

## 2016-01-24 NOTE — Assessment & Plan Note (Signed)
The patient has chronic kidney disease stage III. I recommend nephrology consultation. His recent hyperkalemia could be due to medication side effect from lisinopril or advanced disease. For now, I recommend he discontinued lisinopril. His blood pressure is under control without lisinopril For some reason, despite placing a referral over 3 months ago, he has not been seen by nephrologist. I will call the office again and recommend he consult with his primary care doctor for close blood work monitoring

## 2016-01-24 NOTE — Assessment & Plan Note (Signed)
His bone marrow biopsy showed no signs of myelodysplastic syndrome or lymphoma. CT scan of the abdomen showed no evidence of liver cirrhosis or splenomegaly. Overall, I think the mild leukopenia and thrombocytopenia could be due to very early signs of myelodysplastic syndrome but he is not symptomatic and would not require treatment for that.  It is conceivable that he had anemia due to iron deficiency from bleeding. Since he is barely responding to oral iron therapy, I would prescribe intravenous iron for him  After intravenous iron infusion, hemoglobin has improved to greater than 10 He does not need darbepoetin injection and can be observed I plan to see him back in 3 months for further evaluation I recommend prophylactic influenza vaccination

## 2016-01-24 NOTE — Progress Notes (Signed)
[place], MD SUMMARY OF HEMATOLOGIC HISTORY:  [name] 65 y.o. male was seen on 11/01/15 because of pancytopenia. The patient is here today accompanied by his brother. The patient has significant major comorbidities including history of stroke causing persistent left-sided weakness, chronic respiratory failure secondary to COPD on chronic oxygen therapy for the past 4 years, chronic atrial fibrillation and significant peripheral vascular disease on chronic anticoagulation therapy and chronic kidney disease. I had the opportunity to review his blood work from 2010. At the time, he has normal CBC. Starting around the end of 2014, he started to develop progressive anemia. Subsequently, starting around May 2016, he started to have progressive thrombocytopenia. According to scanned medical records, he had multiple blood tests done at close interval since September 2016. WBC has reduced from the 3 range down to 2.8 most recently on 10/11/2015. His hemoglobin has drifted down to 8.5 with high MCV and platelet count has drifted down to 85,000 Serum folate and vitamin B-12 were within normal limits. He had iron studies done with serum ferritin at 60. The patient has been placed on iron supplement for some time without significant benefit. On 11/03/2015, he had bone marrow aspirate and biopsy which excluded myelodysplastic syndrome On 12/15/15, CT chest, abdomen and pelvis showed no CT findings to suggest cirrhosis.  He received intravenous iron in September 2017  INTERVAL HISTORY: [name] 65 y.o. male returns for further follow-up. He feels well. The patient denies any recent signs or symptoms of bleeding such as spontaneous epistaxis, hematuria or hematochezia. Denies recent infection  I have reviewed the past medical history, past surgical history, social history and family history with the patient and they are unchanged from  previous note.  ALLERGIES:  has No Known Allergies.  MEDICATIONS:  Current Outpatient Prescriptions  Medication Sig Dispense Refill  . amiodarone (PACERONE) 200 MG tablet TAKE 1 TABLET (200 MG TOTAL) BY MOUTH DAILY. 30 tablet 6  . apixaban (ELIQUIS) 5 MG TABS tablet Take 1 tablet (5 mg total) by mouth 2 (two) times daily. 180 tablet 3  . atorvastatin (LIPITOR) 80 MG tablet TAKE 1 TABLET EVERY DAY AT 6PM 30 tablet 2  . carvedilol (COREG) 25 MG tablet Take 0.5 tablets (12.5 mg total) by mouth 2 (two) times daily. 30 tablet 8  . Fe Fum-FePoly-Vit C-Vit B3 (INTEGRA) 62.5-62.5-40-3 MG CAPS Take 1 capsule by mouth every other day.   4  . folic acid (FOLVITE) 1 MG tablet Take 1 tablet (1 mg total) by mouth daily. 30 tablet 6  . OXYGEN-HELIUM IN Inhale 1-2 L into the lungs continuous. Is in the habit of not using his oxygen when he leaves the house. Uses 1L continuous normally Uses 2L for exertion    . pantoprazole (PROTONIX) 40 MG tablet TAKE 1 TABLET (40 MG TOTAL) BY MOUTH DAILY AS NEEDED (INDIGESTION/REFLUX). 30 tablet 3  . PRESCRIPTION MEDICATION Apply 1 application topically daily.    Marland Kitchen tiZANidine (ZANAFLEX) 4 MG tablet Take 1 tablet by mouth 2 (two) times daily.  5  . traMADol (ULTRAM) 50 MG tablet Take 1 tablet (50 mg total) by mouth 4 (four) times daily. 120 tablet 5  . albuterol (PROVENTIL HFA;VENTOLIN HFA) 108 (90 BASE) MCG/ACT inhaler Inhale 2 puffs into the lungs every 6 (six) hours as needed for wheezing or shortness of breath. (Patient not taking: Reported on 01/24/2016) 1 Inhaler 2   No current facility-administered medications for this visit.  REVIEW OF SYSTEMS:   Constitutional: Denies fevers, chills or night sweats Eyes: Denies blurriness of vision Ears, nose, mouth, throat, and face: Denies mucositis or sore throat Respiratory: Denies cough, dyspnea or wheezes Cardiovascular: Denies palpitation, chest discomfort or lower extremity swelling Gastrointestinal:  Denies  nausea, heartburn or change in bowel habits Skin: Denies abnormal skin rashes Lymphatics: Denies new lymphadenopathy or easy bruising Neurological:Denies numbness, tingling or new weaknesses Behavioral/Psych: Mood is stable, no new changes  All other systems were reviewed with the patient and are negative.  PHYSICAL EXAMINATION: ECOG PERFORMANCE STATUS: 0 - Asymptomatic  Vitals:   01/24/16 1121  BP: (!) 141/45  Pulse: (!) 51  Resp: 19  Temp: 98.2 F (36.8 C)   Filed Weights   01/24/16 1121  Weight: 152 lb 4.8 oz (69.1 kg)    GENERAL:alert, no distress and comfortable SKIN: skin color, texture, turgor are normal, no rashes or significant lesions EYES: normal, Conjunctiva are pink and non-injected, sclera clear Musculoskeletal:no cyanosis of digits and no clubbing  NEURO: alert & oriented x 3 with fluent speech, no focal motor/sensory deficits  LABORATORY DATA:  I have reviewed the data as listed     Component Value Date/Time   NA 140 12/15/2015 0855   K 5.5 No visable hemolysis (H) 01/17/2016 1036   CL 108 12/17/2014 1048   CO2 24 12/15/2015 0855   GLUCOSE 82 12/15/2015 0855   BUN 28.5 (H) 12/15/2015 0855   CREATININE 1.6 (H) 12/15/2015 0855   CALCIUM 8.9 12/15/2015 0855   PROT 6.9 12/15/2015 0855   ALBUMIN 2.9 (L) 12/15/2015 0855   AST 56 (H) 12/15/2015 0855   ALT 33 12/15/2015 0855   ALKPHOS 82 12/15/2015 0855   BILITOT 0.38 12/15/2015 0855   GFRNONAA 30 (L) 12/17/2014 1048   GFRNONAA 57 (L) 10/23/2013 1007   GFRAA 35 (L) 12/17/2014 1048   GFRAA 66 10/23/2013 1007    No results found for: SPEP, UPEP  Lab Results  Component Value Date   WBC 3.7 (L) 01/17/2016   NEUTROABS 1.6 01/17/2016   HGB 10.3 (L) 01/17/2016   HCT 32.9 (L) 01/17/2016   MCV 94.5 01/17/2016   PLT 90 (L) 01/17/2016      Chemistry      Component Value Date/Time   NA 140 12/15/2015 0855   K 5.5 No visable hemolysis (H) 01/17/2016 1036   CL 108 12/17/2014 1048   CO2 24 12/15/2015  0855   BUN 28.5 (H) 12/15/2015 0855   CREATININE 1.6 (H) 12/15/2015 0855      Component Value Date/Time   CALCIUM 8.9 12/15/2015 0855   ALKPHOS 82 12/15/2015 0855   AST 56 (H) 12/15/2015 0855   ALT 33 12/15/2015 0855   BILITOT 0.38 12/15/2015 0855      ASSESSMENT & PLAN:  Pancytopenia, acquired ([place]) His bone marrow biopsy showed no signs of myelodysplastic syndrome or lymphoma. CT scan of the abdomen showed no evidence of liver cirrhosis or splenomegaly. Overall, I think the mild leukopenia and thrombocytopenia could be due to very early signs of myelodysplastic syndrome but he is not symptomatic and would not require treatment for that.  It is conceivable that he had anemia due to iron deficiency from bleeding. Since he is barely responding to oral iron therapy, I would prescribe intravenous iron for him  After intravenous iron infusion, hemoglobin has improved to greater than 10 He does not need darbepoetin injection and can be observed I plan to see him back in 3 months  for further evaluation I recommend prophylactic influenza vaccination  Chronic kidney disease, stage III (moderate) The patient has chronic kidney disease stage III. I recommend nephrology consultation. His recent hyperkalemia could be due to medication side effect from lisinopril or advanced disease. For now, I recommend he discontinued lisinopril. His blood pressure is under control without lisinopril For some reason, despite placing a referral over 3 months ago, he has not been seen by nephrologist. I will call the office again and recommend he consult with his primary care doctor for close blood work monitoring   All questions were answered. The patient knows to call the clinic with any problems, questions or concerns. No barriers to learning was detected.  I spent 15 minutes counseling the patient face to face. The total time spent in the appointment was 20 minutes and more than 50% was on counseling.      Heath Lark, MD 10/2/201711:50 AM

## 2016-01-24 NOTE — Telephone Encounter (Signed)
-----   Message from Heath Lark, MD sent at 01/24/2016 11:51 AM EDT ----- Regarding: neprhology consult I placed a referral 3 months ago Can you follow?

## 2016-01-26 ENCOUNTER — Telehealth: Payer: Self-pay | Admitting: *Deleted

## 2016-01-26 NOTE — Telephone Encounter (Signed)
Received fax from [employer]. Referral received. Pt will be scheduled within 1 month

## 2016-01-26 NOTE — Telephone Encounter (Signed)
Left message with appt time for Dr Pearson Grippe at [facility] 02/04/16 @ 1000.

## 2016-02-04 DIAGNOSIS — E875 Hyperkalemia: Secondary | ICD-10-CM | POA: Diagnosis not present

## 2016-02-04 DIAGNOSIS — N183 Chronic kidney disease, stage 3 (moderate): Secondary | ICD-10-CM | POA: Diagnosis not present

## 2016-02-04 DIAGNOSIS — I10 Essential (primary) hypertension: Secondary | ICD-10-CM | POA: Diagnosis not present

## 2016-03-06 ENCOUNTER — Telehealth (HOSPITAL_COMMUNITY): Payer: Self-pay | Admitting: Vascular Surgery

## 2016-03-06 NOTE — Telephone Encounter (Signed)
Patient reports generalized swelling in feet and abdomen. Requesting fluid pills. Advised as he has not been seen since [place] and is not currently on a diuretic, will need to be seen this week with PA. Patient to call us back to let us know when he can have transportation bring him. Denies SOB, CP, or any other s/s at this time. Advised if his s/s become worse to report to ED. Patient aware and agreeable to plan as stated above.  Renee Pain, RN

## 2016-03-06 NOTE — Telephone Encounter (Signed)
Pt feet and ankles are swelling he would like to speak to someone about getting medication to help with his swelling .Marland Kitchen Please advise

## 2016-03-07 ENCOUNTER — Ambulatory Visit (HOSPITAL_COMMUNITY)
Admission: RE | Admit: 2016-03-07 | Discharge: 2016-03-07 | Disposition: A | Discharge status: Home / Self Care | Payer: Medicare Other | Source: Ambulatory Visit | Attending: Cardiology | Admitting: Cardiology

## 2016-03-07 ENCOUNTER — Encounter (HOSPITAL_COMMUNITY): Payer: Self-pay

## 2016-03-07 VITALS — BP 110/58 | HR 64 | Wt 164.0 lb

## 2016-03-07 DIAGNOSIS — R001 Bradycardia, unspecified: Secondary | ICD-10-CM | POA: Insufficient documentation

## 2016-03-07 DIAGNOSIS — I350 Nonrheumatic aortic (valve) stenosis: Secondary | ICD-10-CM | POA: Diagnosis not present

## 2016-03-07 DIAGNOSIS — I10 Essential (primary) hypertension: Secondary | ICD-10-CM | POA: Diagnosis not present

## 2016-03-07 DIAGNOSIS — I130 Hypertensive heart and chronic kidney disease with heart failure and stage 1 through stage 4 chronic kidney disease, or unspecified chronic kidney disease: Secondary | ICD-10-CM | POA: Insufficient documentation

## 2016-03-07 DIAGNOSIS — I5042 Chronic combined systolic (congestive) and diastolic (congestive) heart failure: Secondary | ICD-10-CM | POA: Diagnosis not present

## 2016-03-07 DIAGNOSIS — Z95820 Peripheral vascular angioplasty status with implants and grafts: Secondary | ICD-10-CM | POA: Diagnosis not present

## 2016-03-07 DIAGNOSIS — R531 Weakness: Secondary | ICD-10-CM | POA: Insufficient documentation

## 2016-03-07 DIAGNOSIS — Z8249 Family history of ischemic heart disease and other diseases of the circulatory system: Secondary | ICD-10-CM | POA: Insufficient documentation

## 2016-03-07 DIAGNOSIS — I482 Chronic atrial fibrillation, unspecified: Secondary | ICD-10-CM

## 2016-03-07 DIAGNOSIS — Z9889 Other specified postprocedural states: Secondary | ICD-10-CM | POA: Insufficient documentation

## 2016-03-07 DIAGNOSIS — Z8673 Personal history of transient ischemic attack (TIA), and cerebral infarction without residual deficits: Secondary | ICD-10-CM | POA: Diagnosis not present

## 2016-03-07 DIAGNOSIS — N183 Chronic kidney disease, stage 3 unspecified: Secondary | ICD-10-CM

## 2016-03-07 DIAGNOSIS — N179 Acute kidney failure, unspecified: Secondary | ICD-10-CM | POA: Insufficient documentation

## 2016-03-07 DIAGNOSIS — E785 Hyperlipidemia, unspecified: Secondary | ICD-10-CM

## 2016-03-07 DIAGNOSIS — Z87891 Personal history of nicotine dependence: Secondary | ICD-10-CM | POA: Insufficient documentation

## 2016-03-07 DIAGNOSIS — Z951 Presence of aortocoronary bypass graft: Secondary | ICD-10-CM | POA: Diagnosis not present

## 2016-03-07 DIAGNOSIS — Z7901 Long term (current) use of anticoagulants: Secondary | ICD-10-CM | POA: Diagnosis not present

## 2016-03-07 DIAGNOSIS — I619 Nontraumatic intracerebral hemorrhage, unspecified: Secondary | ICD-10-CM | POA: Insufficient documentation

## 2016-03-07 DIAGNOSIS — I429 Cardiomyopathy, unspecified: Secondary | ICD-10-CM | POA: Diagnosis not present

## 2016-03-07 DIAGNOSIS — J449 Chronic obstructive pulmonary disease, unspecified: Secondary | ICD-10-CM | POA: Diagnosis not present

## 2016-03-07 DIAGNOSIS — I2510 Atherosclerotic heart disease of native coronary artery without angina pectoris: Secondary | ICD-10-CM

## 2016-03-07 DIAGNOSIS — Z953 Presence of xenogenic heart valve: Secondary | ICD-10-CM | POA: Diagnosis not present

## 2016-03-07 DIAGNOSIS — M109 Gout, unspecified: Secondary | ICD-10-CM | POA: Insufficient documentation

## 2016-03-07 DIAGNOSIS — Z9981 Dependence on supplemental oxygen: Secondary | ICD-10-CM | POA: Insufficient documentation

## 2016-03-07 MED ORDER — TORSEMIDE 20 MG PO TABS: 20.0000 mg | ORAL_TABLET | Freq: Every day | ORAL | 3 refills | Status: DC | PRN

## 2016-03-07 NOTE — Patient Instructions (Signed)
Take Torsemide 20mg  (1 tablet)  for 3 days....then you may take 20mg   (1 tablet) daily as needed.  **Please call our office at [phone] Friday and let us know if your swelling is down**  Lab work in 7 days. (bmet)  Follow up with Jonni Sanger in 2 weeks.

## 2016-03-07 NOTE — Progress Notes (Signed)
Advanced Heart Failure Medication Review by a Pharmacist  Does the patient  feel that his/her medications are working for him/her?  yes  Has the patient been experiencing any side effects to the medications prescribed?  no  Does the patient measure his/her own blood pressure or blood glucose at home?  yes   Does the patient have any problems obtaining medications due to transportation or finances?   no  Understanding of regimen: good Understanding of indications: good Potential of compliance: good Patient understands to avoid NSAIDs. Patient understands to avoid decongestants.  Issues to address at subsequent visits: None   Pharmacist comments:  [name] is a pleasant 65 yo M presenting without a medication list but with good recall of his regimen. He reports good compliance with his regimen but did not have any specific medication-related questions or concerns for me at this time.   [name], PharmD, BCPS, CPP Clinical Pharmacist Pager: [phone] Phone: [phone] 03/07/2016 11:57 AM      Time with patient: 10 minutes Preparation and documentation time: 2 minutes Total time: 12 minutes

## 2016-03-07 NOTE — Progress Notes (Signed)
Advanced Heart Failure Clinic Note   Patient ID: [name], male   DOB: Aug 31, 1950, 65 y.o.   MRN: [id number]   PCP: Dr. Ashby Dawes  EP: Dr Lovena Le  Renal: Dr. Joelyn Oms  HPI: [name] is 65 y.o. male with past medical history of severe aortic stenosis s/p tissue AV replacement (09/2012) atrial fibrillation s/p MAZE (09/2012)  chronic combined systolic/diastolic HF, RV failure, HTN, PAD s/p L iliac stent in 2010 and aortobifemoral bypass graft, ETOH abuse, severe COPD with cor pulmonale on home oxygen, CAD s/p CABG with AV replacement and back pain from spinal stenosis.   Admitted and treated for acute failure complicated by listeria bacteremia and Afib RVR 09/2013. Diuresed with IV lasix transitioned to po lasix.[name] had TEE DC-CV but went back into afib with controlled rate on amiodarone and eliquis. [name] was treated with 2 weeks of antibiotics. Discharge weight was 226 pounds.  Amiodarone was stopped as patient did not hold NSR.    Patient was admitted to [facility] in 12/15 with right basal ganglia intracerebral hemorrhage and left hemiparesis.  [name] had been on Eliquis.  [name] eventually was sent to inpatient rehab, where due to a combination of ongoing diuretic use and poor po intake, [name] developed AKI and hyponatremia.  Diuretics and ACEI were held, and creatinine returned to baseline.    [name] returns today for add on for leg swelling.  Have been getting worse over the past 10 days. Weight at home up 10 lbs. Usually 150-152, now up to 160.  No SOB changing clothes or bathing. Feels like they might have brought on by a gout flare. No SOB/PND. Appetite down as well. Denies palpitations/CP. Denies lightheadedness or dizziness. Taking all medication as directed. Doesn't have torsemide or lasix at home, most recently had torsemide. Though hasn't needed in close to a year. Using a wheelchair mostly.   ECHO 05/2014 EF 50-55% bioprosthetic aortic valve ok very mild gradient RV normal size. Mild HK   ECHO 30-35%  08/2012  ECHO 08/2013 EF 50-55% Grade II DD  TEE 6/15 EF 40-45%, bioprosthetic aortic valve ok   PFTs 9/14 FEV1 1.1L (34%) FVC 2.57L (57%) FEF 25-75% (17%) DLCO 41%  Labs (9/15): LDL 106 Labs (05/05/14): K 3.4, creatinine 1.34, digoxin 0.4 Labs 06/10/2014: K 5.6 Creatinine 2.7 BNP 215 Labs 06/19/2014: K 4.9 Creatinine 2.28 K and spiro stopped.  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 Drink alcohol occasionally . Denies drug use. Lives alone   FH: Father died MI at 89 [name] MI   ROS: All systems negative except as listed in HPI, PMH and Problem List.  Past Medical History:  Diagnosis Date  . Aortic stenosis    echo 06/19/08 with nomal LV function, moderate concentric LVH moderate aortic stenosis area 0.99 cm squared, peak gradient of 50 and mean of 31  . Aortic stenosis, severe 09/23/2012  . AS (aortic stenosis) with AVR with pericardial tissue valve  09/30/2013  . Atrial fibrillation with RVR, was in atrial fib in 04/2011 09/16/2012   a) s/p MAZE (09/2012)   . Back pain, spinal stenosis 09/30/2013  . Bilateral carotid artery disease ([place])   . CAD (coronary artery disease), single vessel disease 09/23/2012   a) CABG (SVG to PDA and PL) wtih AVR (09/2012)   . Chronic combined systolic and diastolic CHF (congestive heart failure) ([place]) 09/16/2012   a) ECHO (08/2013) EF 50-55%, grade II DD b) TEE ECHO (09/2013): 40-45%,  bioprosthesis present, mild Dustin  . COPD (chronic obstructive pulmonary disease) ([place])   . Gout   . Gout flare. 09/29/12, Lt knee, improved with colchicine. 09/30/2012  . H/O aortic valve replacement   . Heart murmur   . Hyperkalemia 11/03/2015  . Hyperlipidemia   . Hypertension   . Need for hepatitis C screening test 11/05/2015  . PAD (peripheral artery disease), decreased bil. ABIs 09/23/2012   a) ABIs (11/2013): Right mild arterial insufficiency, L normal; aroto-bifem bypass graft: not well visualized, bilateral SFAs = to greater than 50%  in dimaeter reduction   . Stroke ([place])   . Tobacco use 09/16/2012    Current Outpatient Prescriptions  Medication Sig Dispense Refill  . albuterol (PROVENTIL HFA;VENTOLIN HFA) 108 (90 BASE) MCG/ACT inhaler Inhale 2 puffs into the lungs every 6 (six) hours as needed for wheezing or shortness of breath. (Patient not taking: Reported on 01/24/2016) 1 Inhaler 2  . amiodarone (PACERONE) 200 MG tablet TAKE 1 TABLET (200 MG TOTAL) BY MOUTH DAILY. 30 tablet 6  . apixaban (ELIQUIS) 5 MG TABS tablet Take 1 tablet (5 mg total) by mouth 2 (two) times daily. 180 tablet 3  . atorvastatin (LIPITOR) 80 MG tablet TAKE 1 TABLET EVERY DAY AT 6PM 30 tablet 2  . carvedilol (COREG) 25 MG tablet Take 0.5 tablets (12.5 mg total) by mouth 2 (two) times daily. 30 tablet 8  . Fe Fum-FePoly-Vit C-Vit B3 (INTEGRA) 62.5-62.5-40-3 MG CAPS Take 1 capsule by mouth every other day.   4  . folic acid (FOLVITE) 1 MG tablet Take 1 tablet (1 mg total) by mouth daily. 30 tablet 6  . OXYGEN-HELIUM IN Inhale 1-2 L into the lungs continuous. Is in the habit of not using his oxygen when [name] leaves the house. Uses 1L continuous normally Uses 2L for exertion    . pantoprazole (PROTONIX) 40 MG tablet TAKE 1 TABLET (40 MG TOTAL) BY MOUTH DAILY AS NEEDED (INDIGESTION/REFLUX). 30 tablet 3  . PRESCRIPTION MEDICATION Apply 1 application topically daily.    Marland Kitchen tiZANidine (ZANAFLEX) 4 MG tablet Take 1 tablet by mouth 2 (two) times daily.  5  . traMADol (ULTRAM) 50 MG tablet Take 1 tablet (50 mg total) by mouth 4 (four) times daily. 120 tablet 5   No current facility-administered medications for this encounter.    Vitals:   03/07/16 1145  BP: (!) 110/58  Pulse: 64  SpO2: 99%  Weight: 164 lb (74.4 kg)   PHYSICAL EXAM: General:  Elderly appearing. Seated in WC. [name] present.  HEENT: Normal Neck: supple. JVP flat, Carotids 2+ bilaterally; no bruits. No thyromegaly or nodule noted.  Cor: PMI normal. Brady rgular, 2/6 SEM RUSB.  Lungs:  barrel chested. Diminished breath sounds.  Fine basilar crackles.  Abdomen: soft, NT, ND, no HSM. No bruits or masses. +BS  Extremities: no cyanosis, clubbing, rash. No lower extremity edema.    Neuro: alert & orientedx3, cranial nerves grossly intact. Left-sided weakness.   EKG: Sinus Brady, 50 bpm  ASSESSMENT & PLAN:  1. Biventricular HF (R>L): Suspect mixed ischemic/nonischemic cardiomyopathy,  EF 40-45% on 6/15 TEE. >> Echo 06/10/2015 LVEF 55%, RV improved from previous.  - Functional status limited due to previous ICH/stroke. Mostly sedentary. - Volume status elevated on exam with 2+ edema BLE 2/3 of way to knees. - Continue carvedilol 12.5 BID. Cannot up-titrate due to bradycardia.  - Will give [name] a prescription for torsemide 20 mg.  Will have [name] take daily x 3 days. Then  take as needed. Would like [name] to call the office on Friday to let us know how [name] is doing.   - Had stable BMET last week per patient.  Will check BMET next week with diuresis.  2. Severe COPD with cor pulmonale:  - Uses 02 continuously.  Sees Dr. Lake Bells.   3 Chronic A fib: s/p MAZE. Failed DC-CV x3 in November 2014 and June 2015. However, [name] was in NSR in 12/15 at time of hospital admission.   - Maintaining NSR on amiodarone 200 mg daily.  [name] will need yearly eye exam and  PFTs.  - Continue eliquis 5 mg twice a day . No bleeding problems. - [name] is very bradycardic today. Will cut carvedilol to 12.5 bid. Can cut further as needed. 4. CAD: s/p CABG bioprosthetic AVR.  Off ASA with Eliquis 5. H/o Intracerebral hemorrhage: Still has left-sided weakness. Continue eliquis per neuro.   6. CKD stage III- IV - Recently saw Dr. Joelyn Oms.    - Recheck BMET next week with resumption of torsemide.    Volume overloaded on exam.  Resume torsemide as above, take for next three days. Aim for home weight around 150-152 per his baseline.  Should take a torsemide when weight gets to 155-156.   Will follow up 2 weeks to re-assess  volume status.   Shirley Friar, PA-C 03/07/2016   Total time spent > 25 minutes. Over half that spent discussing the above.

## 2016-03-08 ENCOUNTER — Other Ambulatory Visit (HOSPITAL_COMMUNITY): Payer: Self-pay | Admitting: Adult Health

## 2016-03-10 ENCOUNTER — Telehealth (HOSPITAL_COMMUNITY): Payer: Self-pay

## 2016-03-10 NOTE — Telephone Encounter (Signed)
Can take torsemide 40 mg daily for next 2-3 days. (depending on weight loss).  Has lab work on 03/14/16. Please stress importance of this.    Thanks.   Legrand Como [address]" [place], PA-C 03/10/2016 10:39 AM

## 2016-03-10 NOTE — Telephone Encounter (Signed)
Patient called CHF clinic triage to update weight, 161 lbs with continued BLEE.  No SOB noted.  Will forward to Oda Kilts who last saw patient in clinic this week for any further recommendations.  Renee Pain, RN

## 2016-03-10 NOTE — Telephone Encounter (Signed)
Patient aware and agreeable to plan as ordered per Oda Kilts PA-C.  Renee Pain, RN

## 2016-03-11 ENCOUNTER — Other Ambulatory Visit (HOSPITAL_COMMUNITY): Payer: Self-pay | Admitting: Adult Health

## 2016-03-13 ENCOUNTER — Ambulatory Visit (HOSPITAL_COMMUNITY)
Admission: RE | Admit: 2016-03-13 | Discharge: 2016-03-13 | Disposition: A | Discharge status: Home / Self Care | Payer: Medicare Other | Source: Ambulatory Visit | Attending: Adult Health | Admitting: Adult Health

## 2016-03-13 VITALS — BP 132/56 | HR 54

## 2016-03-13 DIAGNOSIS — M109 Gout, unspecified: Secondary | ICD-10-CM | POA: Insufficient documentation

## 2016-03-13 DIAGNOSIS — Z7901 Long term (current) use of anticoagulants: Secondary | ICD-10-CM | POA: Diagnosis not present

## 2016-03-13 DIAGNOSIS — Z87891 Personal history of nicotine dependence: Secondary | ICD-10-CM | POA: Diagnosis not present

## 2016-03-13 DIAGNOSIS — I350 Nonrheumatic aortic (valve) stenosis: Secondary | ICD-10-CM | POA: Insufficient documentation

## 2016-03-13 DIAGNOSIS — N183 Chronic kidney disease, stage 3 unspecified: Secondary | ICD-10-CM

## 2016-03-13 DIAGNOSIS — Z8673 Personal history of transient ischemic attack (TIA), and cerebral infarction without residual deficits: Secondary | ICD-10-CM | POA: Diagnosis not present

## 2016-03-13 DIAGNOSIS — I429 Cardiomyopathy, unspecified: Secondary | ICD-10-CM | POA: Diagnosis not present

## 2016-03-13 DIAGNOSIS — J449 Chronic obstructive pulmonary disease, unspecified: Secondary | ICD-10-CM | POA: Insufficient documentation

## 2016-03-13 DIAGNOSIS — E785 Hyperlipidemia, unspecified: Secondary | ICD-10-CM | POA: Diagnosis not present

## 2016-03-13 DIAGNOSIS — R531 Weakness: Secondary | ICD-10-CM | POA: Insufficient documentation

## 2016-03-13 DIAGNOSIS — I5042 Chronic combined systolic (congestive) and diastolic (congestive) heart failure: Secondary | ICD-10-CM | POA: Insufficient documentation

## 2016-03-13 DIAGNOSIS — I480 Paroxysmal atrial fibrillation: Secondary | ICD-10-CM

## 2016-03-13 DIAGNOSIS — Z953 Presence of xenogenic heart valve: Secondary | ICD-10-CM | POA: Diagnosis not present

## 2016-03-13 DIAGNOSIS — Z9889 Other specified postprocedural states: Secondary | ICD-10-CM | POA: Diagnosis not present

## 2016-03-13 DIAGNOSIS — Z9981 Dependence on supplemental oxygen: Secondary | ICD-10-CM | POA: Insufficient documentation

## 2016-03-13 DIAGNOSIS — I2510 Atherosclerotic heart disease of native coronary artery without angina pectoris: Secondary | ICD-10-CM | POA: Insufficient documentation

## 2016-03-13 DIAGNOSIS — I130 Hypertensive heart and chronic kidney disease with heart failure and stage 1 through stage 4 chronic kidney disease, or unspecified chronic kidney disease: Secondary | ICD-10-CM | POA: Insufficient documentation

## 2016-03-13 DIAGNOSIS — Z8249 Family history of ischemic heart disease and other diseases of the circulatory system: Secondary | ICD-10-CM | POA: Insufficient documentation

## 2016-03-13 DIAGNOSIS — I5022 Chronic systolic (congestive) heart failure: Secondary | ICD-10-CM | POA: Diagnosis not present

## 2016-03-13 DIAGNOSIS — Z993 Dependence on wheelchair: Secondary | ICD-10-CM | POA: Diagnosis not present

## 2016-03-13 DIAGNOSIS — N179 Acute kidney failure, unspecified: Secondary | ICD-10-CM | POA: Diagnosis not present

## 2016-03-13 DIAGNOSIS — I619 Nontraumatic intracerebral hemorrhage, unspecified: Secondary | ICD-10-CM | POA: Insufficient documentation

## 2016-03-13 DIAGNOSIS — Z951 Presence of aortocoronary bypass graft: Secondary | ICD-10-CM | POA: Insufficient documentation

## 2016-03-13 DIAGNOSIS — Z95820 Peripheral vascular angioplasty status with implants and grafts: Secondary | ICD-10-CM | POA: Diagnosis not present

## 2016-03-13 DIAGNOSIS — I5041 Acute combined systolic (congestive) and diastolic (congestive) heart failure: Secondary | ICD-10-CM

## 2016-03-13 DIAGNOSIS — R5381 Other malaise: Secondary | ICD-10-CM

## 2016-03-13 LAB — BASIC METABOLIC PANEL
Anion gap: 9 (ref 5–15)
BUN: 55 mg/dL — AB (ref 6–20)
CHLORIDE: 105 mmol/L (ref 101–111)
CO2: 25 mmol/L (ref 22–32)
CREATININE: 2.57 mg/dL — AB (ref 0.61–1.24)
Calcium: 8.6 mg/dL — ABNORMAL LOW (ref 8.9–10.3)
GFR calc non Af Amer: 24 mL/min — ABNORMAL LOW (ref >60)
GFR, EST AFRICAN AMERICAN: 28 mL/min — AB (ref >60)
Glucose, Bld: 96 mg/dL (ref 65–99)
Potassium: 4.3 mmol/L (ref 3.5–5.1)
SODIUM: 139 mmol/L (ref 135–145)

## 2016-03-13 LAB — BRAIN NATRIURETIC PEPTIDE: B NATRIURETIC PEPTIDE 5: 87.2 pg/mL (ref 0.0–100.0)

## 2016-03-13 MED ORDER — AMIODARONE HCL 200 MG PO TABS: 100.0000 mg | ORAL_TABLET | Freq: Every day | ORAL | 6 refills | Status: DC

## 2016-03-13 MED ORDER — METOLAZONE 2.5 MG PO TABS: 2.5000 mg | ORAL_TABLET | ORAL | 3 refills | Status: DC

## 2016-03-13 NOTE — Patient Instructions (Addendum)
Take Torsemide 40 mg (2 tabs) Twice daily FOR 2 DAYS ONLY, then back to 20 mg (1 tab) Twice daily   Take Metolazone 2.5 mg 30 minutes prior to AM dose of Torsemide for 2 DAYS ONLY  Decrease Amiodarone to 100 mg daily  Labs today  You have been referred to [facility], they will contact you

## 2016-03-13 NOTE — Progress Notes (Signed)
Advanced Heart Failure Clinic Note   Patient ID: [name], male   DOB: [birth year], 65 y.o.   MRN: [id number]   PCP: Dr. Ashby Dawes  EP: Dr Lovena Le  Renal: Dr. Joelyn Oms  HPI: [name] is 65 y.o. male with past medical history of severe aortic stenosis s/p tissue AV replacement (09/2012) atrial fibrillation s/p MAZE (09/2012)  chronic combined systolic/diastolic HF, RV failure, HTN, PAD s/p L iliac stent in 2010 and aortobifemoral bypass graft, ETOH abuse, severe COPD with cor pulmonale on home oxygen, CAD s/p CABG with AV replacement and back pain from spinal stenosis.   Admitted and treated for acute failure complicated by listeria bacteremia and Afib RVR 09/2013. Diuresed with IV lasix transitioned to po lasix.He had TEE DC-CV but went back into afib with controlled rate on amiodarone and eliquis. He was treated with 2 weeks of antibiotics. Discharge weight was 226 pounds.  Amiodarone was stopped as patient did not hold NSR.    Patient was admitted to [facility] in 12/15 with right basal ganglia intracerebral hemorrhage and left hemiparesis.  He had been on Eliquis.  He eventually was sent to inpatient rehab, where due to a combination of ongoing diuretic use and poor po intake, he developed AKI and hyponatremia.  Diuretics and ACEI were held, and creatinine returned to baseline.    He returns today for an acute visit due to increased leg edema. Last week he was seen and he was started on torsemide 40 mg twice a day. He says he has only been taking 20 mg twice a day. He has only been able to weigh one time since last week. +Orthopnea. Denies PND. Mild dyspnea with exertion.  Weight at that time was 161 pounds. (ideal weight for him 152-154 pounds).  He has been taking ibuprofen.daily.  He had been wheel chair bound for the last 10 days. Drinking lots of fluids. Eating loaded baked potatoes.    ECHO 05/2014 EF 50-55% bioprosthetic aortic valve ok very mild gradient RV normal size. Mild HK   ECHO  30-35% 08/2012  ECHO 08/2013 EF 50-55% Grade II DD  TEE 6/15 EF 40-45%, bioprosthetic aortic valve ok   PFTs 9/14 FEV1 1.1L (34%) FVC 2.57L (57%) FEF 25-75% (17%) DLCO 41%  Labs (9/15): LDL 106 Labs (05/05/14): K 3.4, creatinine 1.34, digoxin 0.4 Labs 06/10/2014: K 5.6 Creatinine 2.7 BNP 215 Labs 06/19/2014: K 4.9 Creatinine 2.28 K and spiro stopped.  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 Drink alcohol occasionally . Denies drug use. Lives alone   FH: Father died MI at 64 Brother MI   ROS: All systems negative except as listed in HPI, PMH and Problem List.  Past Medical History:  Diagnosis Date  . Aortic stenosis    echo 06/19/08 with nomal LV function, moderate concentric LVH moderate aortic stenosis area 0.99 cm squared, peak gradient of 50 and mean of 31  . Aortic stenosis, severe 09/23/2012  . AS (aortic stenosis) with AVR with pericardial tissue valve  09/30/2013  . Atrial fibrillation with RVR, was in atrial fib in 04/2011 09/16/2012   a) s/p MAZE (09/2012)   . Back pain, spinal stenosis 09/30/2013  . Bilateral carotid artery disease ([place])   . CAD (coronary artery disease), single vessel disease 09/23/2012   a) CABG (SVG to PDA and PL) wtih AVR (09/2012)   . Chronic combined systolic and diastolic CHF (congestive heart failure) ([place]) 09/16/2012   a) ECHO (  08/2013) EF 50-55%, grade II DD b) TEE ECHO (09/2013): 40-45%, bioprosthesis present, mild Dustin  . COPD (chronic obstructive pulmonary disease) ([place])   . Gout   . Gout flare. 09/29/12, Lt knee, improved with colchicine. 09/30/2012  . H/O aortic valve replacement   . Heart murmur   . Hyperkalemia 11/03/2015  . Hyperlipidemia   . Hypertension   . Need for hepatitis C screening test 11/05/2015  . PAD (peripheral artery disease), decreased bil. ABIs 09/23/2012   a) ABIs (11/2013): Right mild arterial insufficiency, L normal; aroto-bifem bypass graft: not well visualized, bilateral SFAs = to greater  than 50% in dimaeter reduction   . Stroke ([place])   . Tobacco use 09/16/2012    Current Outpatient Prescriptions  Medication Sig Dispense Refill  . albuterol (PROVENTIL HFA;VENTOLIN HFA) 108 (90 BASE) MCG/ACT inhaler Inhale 2 puffs into the lungs every 6 (six) hours as needed for wheezing or shortness of breath. 1 Inhaler 2  . amiodarone (PACERONE) 200 MG tablet TAKE 1 TABLET (200 MG TOTAL) BY MOUTH DAILY. 30 tablet 6  . apixaban (ELIQUIS) 5 MG TABS tablet Take 1 tablet (5 mg total) by mouth 2 (two) times daily. 180 tablet 3  . atorvastatin (LIPITOR) 80 MG tablet TAKE 1 TABLET EVERY DAY AT 6PM 30 tablet 2  . carvedilol (COREG) 25 MG tablet Take 0.5 tablets (12.5 mg total) by mouth 2 (two) times daily. 30 tablet 8  . Fe Fum-FePoly-Vit C-Vit B3 (INTEGRA) 62.5-62.5-40-3 MG CAPS Take 1 capsule by mouth daily.   4  . folic acid (FOLVITE) 1 MG tablet Take 1 tablet (1 mg total) by mouth daily. 30 tablet 6  . OXYGEN-HELIUM IN Inhale 1-2 L into the lungs continuous. Is in the habit of not using his oxygen when he leaves the house. Uses 1L continuous normally Uses 2L for exertion    . pantoprazole (PROTONIX) 40 MG tablet Take 40 mg by mouth daily.    Marland Kitchen PRESCRIPTION MEDICATION Apply 1 application topically daily.    Marland Kitchen tiZANidine (ZANAFLEX) 4 MG tablet Take 1 tablet by mouth 2 (two) times daily.  5  . torsemide (DEMADEX) 20 MG tablet Take 20 mg by mouth 2 (two) times daily.    . traMADol (ULTRAM) 50 MG tablet Take 1 tablet (50 mg total) by mouth 4 (four) times daily. 120 tablet 5   No current facility-administered medications for this encounter.    Vitals:   03/13/16 0912  BP: (!) 132/56  BP Location: Right Arm  Patient Position: Sitting  Cuff Size: Normal  Pulse: (!) 54  SpO2: 99%   PHYSICAL EXAM: General:  Elderly appearing. Seated in WC. Son present.  HEENT: Normal Neck: supple. JVP to jaw, Carotids 2+ bilaterally; no bruits. No thyromegaly or nodule noted.  Cor: PMI normal. Brady rgular,  2/6 SEM RUSB.  Lungs: barrel chested. Diminished breath sounds.  Fine basilar crackles.  Abdomen: soft, NT, ND, no HSM. No bruits or masses. +BS  Extremities: no cyanosis, clubbing, rash. R and LLE 3+ edema.   LLE partial thickness wounds  Neuro: alert & orientedx3, cranial nerves grossly intact. Left-sided weakness.     ASSESSMENT & PLAN:  1.Acute/Chronic  Biventricular HF (R>L): Suspect mixed ischemic/nonischemic cardiomyopathy,  EF 40-45% on 6/15 TEE. >> Echo 06/10/2015 LVEF 55%, RV improved from previous.  -Functional status limited due to previous ICH/stroke. Mostly sedentary. - Volume status elevated on exam. Likely in the setting of high salt diet. Today he is instructed to take metolazone 2.5  mg  and torsemide 40 mg twice a day for 2 days. Then he will go back to torsemide 20 mg twice a day.  - Continue carvedilol 12.5 BID.  -Discussed low salt food choices and limiting fluid intake to < 2 liters per day.  BMET and BNP today.  2. Severe COPD with cor pulmonale:  - Uses 02 continuously.  Sees Dr. Lake Bells.   3 PAF: s/p MAZE. Failed DC-CV x3 in November 2014 and June 2015. However, he was in NSR in 12/15 at time of hospital admission.   - Regular pulse. Cut amio back to 100 mg daily.  He will need yearly eye exam and  PFTs.  - Continue eliquis 5 mg twice a day . No bleeding problems. 4. CAD: s/p CABG bioprosthetic AVR.  Off ASA with Eliquis 5. H/o Intracerebral hemorrhage: Still has left-sided weakness. Continue eliquis per neuro.   6. CKD stage III- IV - Followed by Dr. Joelyn Oms.    - Recheck BMET today and Wednesday.  7. Deconditioning- Unable to walk due to weakness. Refer to [facility] for HHPT.   Discussed hospital admit today for tune up however he declined. Follow up on Wednesday. Will need hospital admit if unable to diurese with oral diuretics. Refer to [facility] for [facility] and HHPT + diuretic protocol.    Darrick Grinder, NP-C  03/13/2016

## 2016-03-14 ENCOUNTER — Other Ambulatory Visit (HOSPITAL_COMMUNITY): Payer: Self-pay

## 2016-03-15 ENCOUNTER — Encounter (HOSPITAL_COMMUNITY): Payer: Self-pay | Admitting: Internal Medicine

## 2016-03-15 ENCOUNTER — Ambulatory Visit (HOSPITAL_COMMUNITY)
Admission: RE | Admit: 2016-03-15 | Discharge: 2016-03-15 | Disposition: A | Discharge status: Home / Self Care | Payer: Medicare Other | Source: Ambulatory Visit | Attending: Internal Medicine | Admitting: Internal Medicine

## 2016-03-15 VITALS — BP 120/56 | HR 61

## 2016-03-15 DIAGNOSIS — I429 Cardiomyopathy, unspecified: Secondary | ICD-10-CM | POA: Diagnosis not present

## 2016-03-15 DIAGNOSIS — Z95820 Peripheral vascular angioplasty status with implants and grafts: Secondary | ICD-10-CM | POA: Diagnosis not present

## 2016-03-15 DIAGNOSIS — Z7901 Long term (current) use of anticoagulants: Secondary | ICD-10-CM | POA: Insufficient documentation

## 2016-03-15 DIAGNOSIS — L97221 Non-pressure chronic ulcer of left calf limited to breakdown of skin: Secondary | ICD-10-CM | POA: Diagnosis not present

## 2016-03-15 DIAGNOSIS — M549 Dorsalgia, unspecified: Secondary | ICD-10-CM | POA: Insufficient documentation

## 2016-03-15 DIAGNOSIS — Z87891 Personal history of nicotine dependence: Secondary | ICD-10-CM | POA: Diagnosis not present

## 2016-03-15 DIAGNOSIS — I130 Hypertensive heart and chronic kidney disease with heart failure and stage 1 through stage 4 chronic kidney disease, or unspecified chronic kidney disease: Secondary | ICD-10-CM | POA: Insufficient documentation

## 2016-03-15 DIAGNOSIS — I482 Chronic atrial fibrillation: Secondary | ICD-10-CM | POA: Diagnosis not present

## 2016-03-15 DIAGNOSIS — Z9981 Dependence on supplemental oxygen: Secondary | ICD-10-CM | POA: Diagnosis not present

## 2016-03-15 DIAGNOSIS — D631 Anemia in chronic kidney disease: Secondary | ICD-10-CM | POA: Diagnosis not present

## 2016-03-15 DIAGNOSIS — N183 Chronic kidney disease, stage 3 (moderate): Secondary | ICD-10-CM | POA: Diagnosis not present

## 2016-03-15 DIAGNOSIS — I480 Paroxysmal atrial fibrillation: Secondary | ICD-10-CM | POA: Diagnosis not present

## 2016-03-15 DIAGNOSIS — I5022 Chronic systolic (congestive) heart failure: Secondary | ICD-10-CM

## 2016-03-15 DIAGNOSIS — M109 Gout, unspecified: Secondary | ICD-10-CM | POA: Insufficient documentation

## 2016-03-15 DIAGNOSIS — I350 Nonrheumatic aortic (valve) stenosis: Secondary | ICD-10-CM | POA: Diagnosis not present

## 2016-03-15 DIAGNOSIS — E785 Hyperlipidemia, unspecified: Secondary | ICD-10-CM | POA: Diagnosis not present

## 2016-03-15 DIAGNOSIS — J449 Chronic obstructive pulmonary disease, unspecified: Secondary | ICD-10-CM | POA: Diagnosis not present

## 2016-03-15 DIAGNOSIS — L89152 Pressure ulcer of sacral region, stage 2: Secondary | ICD-10-CM | POA: Diagnosis not present

## 2016-03-15 DIAGNOSIS — Z951 Presence of aortocoronary bypass graft: Secondary | ICD-10-CM | POA: Insufficient documentation

## 2016-03-15 DIAGNOSIS — N179 Acute kidney failure, unspecified: Secondary | ICD-10-CM | POA: Insufficient documentation

## 2016-03-15 DIAGNOSIS — E875 Hyperkalemia: Secondary | ICD-10-CM | POA: Insufficient documentation

## 2016-03-15 DIAGNOSIS — I509 Heart failure, unspecified: Secondary | ICD-10-CM | POA: Diagnosis not present

## 2016-03-15 DIAGNOSIS — Z953 Presence of xenogenic heart valve: Secondary | ICD-10-CM | POA: Diagnosis not present

## 2016-03-15 DIAGNOSIS — Z9889 Other specified postprocedural states: Secondary | ICD-10-CM | POA: Diagnosis not present

## 2016-03-15 DIAGNOSIS — Z8673 Personal history of transient ischemic attack (TIA), and cerebral infarction without residual deficits: Secondary | ICD-10-CM | POA: Diagnosis not present

## 2016-03-15 DIAGNOSIS — F329 Major depressive disorder, single episode, unspecified: Secondary | ICD-10-CM | POA: Diagnosis not present

## 2016-03-15 DIAGNOSIS — E871 Hypo-osmolality and hyponatremia: Secondary | ICD-10-CM | POA: Diagnosis not present

## 2016-03-15 DIAGNOSIS — I2510 Atherosclerotic heart disease of native coronary artery without angina pectoris: Secondary | ICD-10-CM | POA: Diagnosis not present

## 2016-03-15 DIAGNOSIS — S81802A Unspecified open wound, left lower leg, initial encounter: Secondary | ICD-10-CM

## 2016-03-15 DIAGNOSIS — I739 Peripheral vascular disease, unspecified: Secondary | ICD-10-CM | POA: Diagnosis not present

## 2016-03-15 DIAGNOSIS — I5042 Chronic combined systolic (congestive) and diastolic (congestive) heart failure: Secondary | ICD-10-CM | POA: Diagnosis not present

## 2016-03-15 DIAGNOSIS — Z952 Presence of prosthetic heart valve: Secondary | ICD-10-CM | POA: Diagnosis not present

## 2016-03-15 DIAGNOSIS — L89622 Pressure ulcer of left heel, stage 2: Secondary | ICD-10-CM | POA: Diagnosis not present

## 2016-03-15 DIAGNOSIS — S50901D Unspecified superficial injury of right elbow, subsequent encounter: Secondary | ICD-10-CM | POA: Diagnosis not present

## 2016-03-15 DIAGNOSIS — Z720 Tobacco use: Secondary | ICD-10-CM | POA: Diagnosis not present

## 2016-03-15 DIAGNOSIS — I50810 Right heart failure, unspecified: Secondary | ICD-10-CM

## 2016-03-15 DIAGNOSIS — L97211 Non-pressure chronic ulcer of right calf limited to breakdown of skin: Secondary | ICD-10-CM | POA: Diagnosis not present

## 2016-03-15 LAB — BASIC METABOLIC PANEL
Anion gap: 12 (ref 5–15)
BUN: 72 mg/dL — ABNORMAL HIGH (ref 6–20)
CALCIUM: 8.9 mg/dL (ref 8.9–10.3)
CO2: 30 mmol/L (ref 22–32)
CREATININE: 3.66 mg/dL — AB (ref 0.61–1.24)
Chloride: 97 mmol/L — ABNORMAL LOW (ref 101–111)
GFR calc non Af Amer: 16 mL/min — ABNORMAL LOW (ref >60)
GFR, EST AFRICAN AMERICAN: 18 mL/min — AB (ref >60)
Glucose, Bld: 90 mg/dL (ref 65–99)
Potassium: 3.6 mmol/L (ref 3.5–5.1)
SODIUM: 139 mmol/L (ref 135–145)

## 2016-03-15 MED ORDER — TORSEMIDE 20 MG PO TABS: 20.0000 mg | ORAL_TABLET | Freq: Every day | ORAL | Status: DC

## 2016-03-15 NOTE — Addendum Note (Signed)
Encounter addended by: Scarlette Calico, RN on: 03/15/2016 10:33 AM<BR>    Actions taken: Visit diagnoses modified, Diagnosis association updated, Order list changed, Sign clinical note

## 2016-03-15 NOTE — Progress Notes (Signed)
Spoke w/[facility], they state they will see patient today.  Order for UNA boots faxed to them at [phone]

## 2016-03-15 NOTE — Addendum Note (Signed)
Encounter addended by: Scarlette Calico, RN on: 03/15/2016 11:27 AM<BR>    Actions taken: Sign clinical note

## 2016-03-15 NOTE — Progress Notes (Signed)
Advanced Heart Failure Clinic Note   Patient ID: [name], male   DOB: 01-May-1950, 65 y.o.   MRN: [id number]   PCP: Dr. Ashby Dawes  EP: Dr Lovena Le  Renal: Dr. Joelyn Oms  HPI: [name] is 65 y.o. male with past medical history of severe aortic stenosis s/p tissue AV replacement (09/2012) atrial fibrillation s/p MAZE (09/2012)  chronic combined systolic/diastolic HF, RV failure, HTN, PAD s/p L iliac stent in 2010 and aortobifemoral bypass graft, ETOH abuse, severe COPD with cor pulmonale on home oxygen, CAD s/p CABG with AV replacement and back pain from spinal stenosis.   Admitted and treated for acute heart failure complicated by listeria bacteremia and Afib RVR 09/2013. Diuresed with IV lasix transitioned to po lasix.He had TEE DC-CV but went back into afib with controlled rate on amiodarone and eliquis. He was treated with 2 weeks of antibiotics. Discharge weight was 226 pounds.  Amiodarone was stopped as patient did not hold NSR.    Patient was admitted to [facility] in 12/15 with right basal ganglia intracerebral hemorrhage and left hemiparesis.  He had been on Eliquis.  He eventually was sent to inpatient rehab, where due to a combination of ongoing diuretic use and poor po intake, he developed AKI and hyponatremia.  Diuretics and ACEI were held, and creatinine returned to baseline.    He was seen by Darrick Grinder 2 days ago for an acute visit due to increased leg edema. He had been off torsemide for many months due to weight loss. He was started back on torsemide 20 bid last week but fluid got worse. Torsemide increased to 40 bid and metolazone 2.5 for 2 days. Here for f/u  Follow up: Says he is peeing a lot but still with swelling. Has become increasingly weak. Unable to stand at home now. Unable to weigh. Labs from 11/20 creatinine 1.6-1.9 range up to 2.6.    ECHO 05/2014 EF 50-55% bioprosthetic aortic valve ok very mild gradient RV normal size. Mild HK   ECHO 30-35% 08/2012  ECHO 08/2013 EF  50-55% Grade II DD  TEE 6/15 EF 40-45%, bioprosthetic aortic valve ok   PFTs 9/14 FEV1 1.1L (34%) FVC 2.57L (57%) FEF 25-75% (17%) DLCO 41%  Labs (9/15): LDL 106 Labs (05/05/14): K 3.4, creatinine 1.34, digoxin 0.4 Labs 06/10/2014: K 5.6 Creatinine 2.7 BNP 215 Labs 06/19/2014: K 4.9 Creatinine 2.28 K and spiro stopped.  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 Drink alcohol occasionally . Denies drug use. Lives alone   FH: Father died MI at 65 Brother MI   ROS: All systems negative except as listed in HPI, PMH and Problem List.  Past Medical History:  Diagnosis Date  . Aortic stenosis    echo 06/19/08 with nomal LV function, moderate concentric LVH moderate aortic stenosis area 0.99 cm squared, peak gradient of 50 and mean of 31  . Aortic stenosis, severe 09/23/2012  . AS (aortic stenosis) with AVR with pericardial tissue valve  09/30/2013  . Atrial fibrillation with RVR, was in atrial fib in 04/2011 09/16/2012   a) s/p MAZE (09/2012)   . Back pain, spinal stenosis 09/30/2013  . Bilateral carotid artery disease ([place])   . CAD (coronary artery disease), single vessel disease 09/23/2012   a) CABG (SVG to PDA and PL) wtih AVR (09/2012)   . Chronic combined systolic and diastolic CHF (congestive heart failure) ([place]) 09/16/2012   a) ECHO (08/2013) EF 50-55%, grade II  DD b) TEE ECHO (09/2013): 40-45%, bioprosthesis present, mild Dustin  . COPD (chronic obstructive pulmonary disease) ([place])   . Gout   . Gout flare. 09/29/12, Lt knee, improved with colchicine. 09/30/2012  . H/O aortic valve replacement   . Heart murmur   . Hyperkalemia 11/03/2015  . Hyperlipidemia   . Hypertension   . Need for hepatitis C screening test 11/05/2015  . PAD (peripheral artery disease), decreased bil. ABIs 09/23/2012   a) ABIs (11/2013): Right mild arterial insufficiency, L normal; aroto-bifem bypass graft: not well visualized, bilateral SFAs = to greater than 50% in dimaeter reduction     . Stroke ([place])   . Tobacco use 09/16/2012    Current Outpatient Prescriptions  Medication Sig Dispense Refill  . albuterol (PROVENTIL HFA;VENTOLIN HFA) 108 (90 BASE) MCG/ACT inhaler Inhale 2 puffs into the lungs every 6 (six) hours as needed for wheezing or shortness of breath. 1 Inhaler 2  . amiodarone (PACERONE) 200 MG tablet Take 0.5 tablets (100 mg total) by mouth daily. 30 tablet 6  . apixaban (ELIQUIS) 5 MG TABS tablet Take 1 tablet (5 mg total) by mouth 2 (two) times daily. 180 tablet 3  . atorvastatin (LIPITOR) 80 MG tablet TAKE 1 TABLET EVERY DAY AT 6PM 30 tablet 2  . carvedilol (COREG) 25 MG tablet Take 0.5 tablets (12.5 mg total) by mouth 2 (two) times daily. 30 tablet 8  . diclofenac sodium (VOLTAREN) 1 % GEL Apply 4 g topically 4 (four) times daily.    . Fe Fum-FePoly-Vit C-Vit B3 (INTEGRA) 62.5-62.5-40-3 MG CAPS Take 1 capsule by mouth daily.   4  . folic acid (FOLVITE) 1 MG tablet Take 1 tablet (1 mg total) by mouth daily. 30 tablet 6  . metolazone (ZAROXOLYN) 2.5 MG tablet Take 1 tablet (2.5 mg total) by mouth as directed. 5 tablet 3  . OXYGEN-HELIUM IN Inhale 1-2 L into the lungs continuous. Is in the habit of not using his oxygen when he leaves the house. Uses 1L continuous normally Uses 2L for exertion    . pantoprazole (PROTONIX) 40 MG tablet Take 40 mg by mouth daily.    Marland Kitchen tiZANidine (ZANAFLEX) 4 MG tablet Take 1 tablet by mouth 2 (two) times daily.  5  . torsemide (DEMADEX) 20 MG tablet Take 20 mg by mouth 2 (two) times daily.    . traMADol (ULTRAM) 50 MG tablet Take 1 tablet (50 mg total) by mouth 4 (four) times daily. 120 tablet 5   No current facility-administered medications for this encounter.    Vitals:   03/15/16 0941  BP: (!) 120/56  Pulse: 61  SpO2: 91%   PHYSICAL EXAM: General:  Elderly appearing. Seated in WC. Son present.  HEENT: Normal Neck: supple. JVP 7, Carotids 2+ bilaterally; no bruits. No thyromegaly or nodule noted.  Cor: PMI normal. Brady  rgular, 2/6 SEM RUSB.  Lungs: barrel chested. Diminished breath sounds.  Fine basilar crackles.  Abdomen: soft, NT, ND, no HSM. No bruits or masses. +BS  Extremities: no cyanosis, clubbing, rash. R and LLE 3+ edema mid calf down.   LLE partial thickness wounds  Neuro: alert & orientedx3, cranial nerves grossly intact. Left-sided weakness.    ASSESSMENT & PLAN:  1.Acute/Chronic  Biventricular HF (R>L): Suspect mixed ischemic/nonischemic cardiomyopathy,  EF 40-45% on 6/15 TEE. >> Echo 06/10/2015 LVEF 55%, RV improved from previous.  -Functional status limited due to previous ICH/stroke. Mostly sedentary. - Volume status improved overall today but still with significant LE edema.  I think much of this may be related to venous insufficiency/lymphedema. Wil cut torsemide to 40 daily and have legs wrapped especially with wound. Will have to watch renal function very closely. Low threshold to cut torsemide back. - Continue carvedilol 12.5 BID.  -Discussed low salt food choices and limiting fluid intake to < 2 liters per day.  BMET today.  2. Severe COPD with cor pulmonale:  - Uses 02 continuously.  Sees Dr. Lake Bells.   3 PAF: s/p MAZE. Failed DC-CV x3 in November 2014 and June 2015. However, he was in NSR in 12/15 at time of hospital admission.   - Regular pulse. Continue 00 mg daily.  He will need yearly eye exam and  PFTs.  - Continue eliquis 5 mg twice a day . No bleeding problems. 4. CAD: s/p CABG bioprosthetic AVR.  Off ASA with Eliquis 5. H/o Intracerebral hemorrhage: Still has left-sided weakness. Continue eliquis per neuro.   6. CKD stage III- IV - Followed by Dr. Joelyn Oms.    - Creatinine up some on 11/20. Recheck BMET today and next week. 7. Deconditioning- Unable to walk due to weakness. Has [facility]to [facility] for HHPT.  8. LE wounds: Wrap legs. [facility] to follow.   F/u with NP/PA next week.   Glori Bickers, MD 03/15/2016

## 2016-03-15 NOTE — Patient Instructions (Signed)
Decrease Torsemide to 40 mg daily  Labs today  Your physician recommends that you schedule a follow-up appointment in: 1 week  [place] will come out today

## 2016-03-15 NOTE — Progress Notes (Signed)
Advanced Heart Failure Medication Review by a Pharmacist  Does the patient  feel that his/her medications are working for him/her?  yes  Has the patient been experiencing any side effects to the medications prescribed?  no  Does the patient measure his/her own blood pressure or blood glucose at home?  no   Does the patient have any problems obtaining medications due to transportation or finances?   no  Understanding of regimen: good Understanding of indications: good Potential of compliance: good Patient understands to avoid NSAIDs. Patient understands to avoid decongestants.  Issues to address at subsequent visits: None   Pharmacist comments:  Mr. Kice is a pleasant 65 yo M presenting with a family member and without a medication list but with good recall of his regimen. He reports excellent compliance with his regimen and states that he took metolazone 2.5 mg x last 2 days and doubled up on his torsemide yesterday with good urination but minimal improvement in LE edema. He did not have any other medication-related questions or concerns for me at this time.   Ruta Hinds. Velva Harman, PharmD, BCPS, CPP Clinical Pharmacist Pager: [phone] Phone: [phone] 03/15/2016 9:52 AM      Time with patient: 10 minutes Preparation and documentation time: 2 minutes Total time: 12 minutes

## 2016-03-17 DIAGNOSIS — I509 Heart failure, unspecified: Secondary | ICD-10-CM | POA: Diagnosis not present

## 2016-03-17 DIAGNOSIS — N183 Chronic kidney disease, stage 3 (moderate): Secondary | ICD-10-CM | POA: Diagnosis not present

## 2016-03-17 DIAGNOSIS — I2510 Atherosclerotic heart disease of native coronary artery without angina pectoris: Secondary | ICD-10-CM | POA: Diagnosis not present

## 2016-03-17 DIAGNOSIS — L89152 Pressure ulcer of sacral region, stage 2: Secondary | ICD-10-CM | POA: Diagnosis not present

## 2016-03-17 DIAGNOSIS — I130 Hypertensive heart and chronic kidney disease with heart failure and stage 1 through stage 4 chronic kidney disease, or unspecified chronic kidney disease: Secondary | ICD-10-CM | POA: Diagnosis not present

## 2016-03-17 DIAGNOSIS — D631 Anemia in chronic kidney disease: Secondary | ICD-10-CM | POA: Diagnosis not present

## 2016-03-17 DIAGNOSIS — I5022 Chronic systolic (congestive) heart failure: Secondary | ICD-10-CM | POA: Diagnosis not present

## 2016-03-18 DIAGNOSIS — I130 Hypertensive heart and chronic kidney disease with heart failure and stage 1 through stage 4 chronic kidney disease, or unspecified chronic kidney disease: Secondary | ICD-10-CM | POA: Diagnosis not present

## 2016-03-18 DIAGNOSIS — D631 Anemia in chronic kidney disease: Secondary | ICD-10-CM | POA: Diagnosis not present

## 2016-03-18 DIAGNOSIS — I2510 Atherosclerotic heart disease of native coronary artery without angina pectoris: Secondary | ICD-10-CM | POA: Diagnosis not present

## 2016-03-18 DIAGNOSIS — N183 Chronic kidney disease, stage 3 (moderate): Secondary | ICD-10-CM | POA: Diagnosis not present

## 2016-03-18 DIAGNOSIS — I5022 Chronic systolic (congestive) heart failure: Secondary | ICD-10-CM | POA: Diagnosis not present

## 2016-03-18 DIAGNOSIS — L89152 Pressure ulcer of sacral region, stage 2: Secondary | ICD-10-CM | POA: Diagnosis not present

## 2016-03-20 ENCOUNTER — Telehealth: Payer: Self-pay | Admitting: *Deleted

## 2016-03-20 DIAGNOSIS — I5022 Chronic systolic (congestive) heart failure: Secondary | ICD-10-CM | POA: Diagnosis not present

## 2016-03-20 DIAGNOSIS — N183 Chronic kidney disease, stage 3 (moderate): Secondary | ICD-10-CM | POA: Diagnosis not present

## 2016-03-20 DIAGNOSIS — L89152 Pressure ulcer of sacral region, stage 2: Secondary | ICD-10-CM | POA: Diagnosis not present

## 2016-03-20 DIAGNOSIS — I130 Hypertensive heart and chronic kidney disease with heart failure and stage 1 through stage 4 chronic kidney disease, or unspecified chronic kidney disease: Secondary | ICD-10-CM | POA: Diagnosis not present

## 2016-03-20 DIAGNOSIS — I2510 Atherosclerotic heart disease of native coronary artery without angina pectoris: Secondary | ICD-10-CM | POA: Diagnosis not present

## 2016-03-20 DIAGNOSIS — D631 Anemia in chronic kidney disease: Secondary | ICD-10-CM | POA: Diagnosis not present

## 2016-03-20 NOTE — Telephone Encounter (Signed)
VM from [place] at [facility],  Asking if Dr. Alvy Bimler received form to recert pt's oxygen orders?  I called Courtney back and left her a VM informing I have no record that Dr. Alvy Bimler ordered oxygen on this patient.  O2 would not be related to the reason Dr. Alvy Bimler sees pt is for his anemia.   Suggested she contact pt's PCP or Cardiologist for oxygen orders.

## 2016-03-21 ENCOUNTER — Encounter (HOSPITAL_COMMUNITY): Payer: Self-pay

## 2016-03-22 DIAGNOSIS — I130 Hypertensive heart and chronic kidney disease with heart failure and stage 1 through stage 4 chronic kidney disease, or unspecified chronic kidney disease: Secondary | ICD-10-CM | POA: Diagnosis not present

## 2016-03-22 DIAGNOSIS — I2510 Atherosclerotic heart disease of native coronary artery without angina pectoris: Secondary | ICD-10-CM | POA: Diagnosis not present

## 2016-03-22 DIAGNOSIS — N183 Chronic kidney disease, stage 3 (moderate): Secondary | ICD-10-CM | POA: Diagnosis not present

## 2016-03-22 DIAGNOSIS — D631 Anemia in chronic kidney disease: Secondary | ICD-10-CM | POA: Diagnosis not present

## 2016-03-22 DIAGNOSIS — L89152 Pressure ulcer of sacral region, stage 2: Secondary | ICD-10-CM | POA: Diagnosis not present

## 2016-03-22 DIAGNOSIS — I5022 Chronic systolic (congestive) heart failure: Secondary | ICD-10-CM | POA: Diagnosis not present

## 2016-03-23 ENCOUNTER — Ambulatory Visit (HOSPITAL_COMMUNITY)
Admission: RE | Admit: 2016-03-23 | Discharge: 2016-03-23 | Disposition: A | Discharge status: Home / Self Care | Payer: Medicare Other | Source: Ambulatory Visit | Attending: Cardiology | Admitting: Cardiology

## 2016-03-23 ENCOUNTER — Other Ambulatory Visit (HOSPITAL_COMMUNITY): Payer: Self-pay | Admitting: Internal Medicine

## 2016-03-23 VITALS — BP 118/52 | HR 56 | Wt 140.0 lb

## 2016-03-23 DIAGNOSIS — E871 Hypo-osmolality and hyponatremia: Secondary | ICD-10-CM | POA: Diagnosis not present

## 2016-03-23 DIAGNOSIS — N183 Chronic kidney disease, stage 3 (moderate): Secondary | ICD-10-CM | POA: Insufficient documentation

## 2016-03-23 DIAGNOSIS — M109 Gout, unspecified: Secondary | ICD-10-CM | POA: Insufficient documentation

## 2016-03-23 DIAGNOSIS — Z9889 Other specified postprocedural states: Secondary | ICD-10-CM | POA: Insufficient documentation

## 2016-03-23 DIAGNOSIS — I5022 Chronic systolic (congestive) heart failure: Secondary | ICD-10-CM

## 2016-03-23 DIAGNOSIS — Z953 Presence of xenogenic heart valve: Secondary | ICD-10-CM | POA: Insufficient documentation

## 2016-03-23 DIAGNOSIS — Z95820 Peripheral vascular angioplasty status with implants and grafts: Secondary | ICD-10-CM | POA: Diagnosis not present

## 2016-03-23 DIAGNOSIS — Z951 Presence of aortocoronary bypass graft: Secondary | ICD-10-CM | POA: Diagnosis not present

## 2016-03-23 DIAGNOSIS — Z7901 Long term (current) use of anticoagulants: Secondary | ICD-10-CM

## 2016-03-23 DIAGNOSIS — Z8673 Personal history of transient ischemic attack (TIA), and cerebral infarction without residual deficits: Secondary | ICD-10-CM | POA: Insufficient documentation

## 2016-03-23 DIAGNOSIS — I130 Hypertensive heart and chronic kidney disease with heart failure and stage 1 through stage 4 chronic kidney disease, or unspecified chronic kidney disease: Secondary | ICD-10-CM | POA: Insufficient documentation

## 2016-03-23 DIAGNOSIS — I482 Chronic atrial fibrillation, unspecified: Secondary | ICD-10-CM

## 2016-03-23 DIAGNOSIS — J449 Chronic obstructive pulmonary disease, unspecified: Secondary | ICD-10-CM | POA: Diagnosis not present

## 2016-03-23 DIAGNOSIS — I2510 Atherosclerotic heart disease of native coronary artery without angina pectoris: Secondary | ICD-10-CM | POA: Insufficient documentation

## 2016-03-23 DIAGNOSIS — Z9981 Dependence on supplemental oxygen: Secondary | ICD-10-CM | POA: Diagnosis not present

## 2016-03-23 DIAGNOSIS — I480 Paroxysmal atrial fibrillation: Secondary | ICD-10-CM | POA: Insufficient documentation

## 2016-03-23 DIAGNOSIS — I5042 Chronic combined systolic (congestive) and diastolic (congestive) heart failure: Secondary | ICD-10-CM | POA: Diagnosis not present

## 2016-03-23 DIAGNOSIS — I350 Nonrheumatic aortic (valve) stenosis: Secondary | ICD-10-CM | POA: Insufficient documentation

## 2016-03-23 DIAGNOSIS — J439 Emphysema, unspecified: Secondary | ICD-10-CM | POA: Diagnosis not present

## 2016-03-23 DIAGNOSIS — Z87891 Personal history of nicotine dependence: Secondary | ICD-10-CM | POA: Diagnosis not present

## 2016-03-23 DIAGNOSIS — E875 Hyperkalemia: Secondary | ICD-10-CM | POA: Diagnosis not present

## 2016-03-23 DIAGNOSIS — N179 Acute kidney failure, unspecified: Secondary | ICD-10-CM | POA: Insufficient documentation

## 2016-03-23 DIAGNOSIS — I429 Cardiomyopathy, unspecified: Secondary | ICD-10-CM | POA: Diagnosis not present

## 2016-03-23 DIAGNOSIS — Z8249 Family history of ischemic heart disease and other diseases of the circulatory system: Secondary | ICD-10-CM | POA: Insufficient documentation

## 2016-03-23 DIAGNOSIS — I10 Essential (primary) hypertension: Secondary | ICD-10-CM

## 2016-03-23 DIAGNOSIS — E785 Hyperlipidemia, unspecified: Secondary | ICD-10-CM | POA: Diagnosis not present

## 2016-03-23 LAB — BASIC METABOLIC PANEL WITH GFR
Anion gap: 8 (ref 5–15)
BUN: 35 mg/dL — ABNORMAL HIGH (ref 6–20)
CO2: 29 mmol/L (ref 22–32)
Calcium: 8.2 mg/dL — ABNORMAL LOW (ref 8.9–10.3)
Chloride: 100 mmol/L — ABNORMAL LOW (ref 101–111)
Creatinine, Ser: 1.95 mg/dL — ABNORMAL HIGH (ref 0.61–1.24)
GFR calc Af Amer: 39 mL/min — ABNORMAL LOW
GFR calc non Af Amer: 34 mL/min — ABNORMAL LOW
Glucose, Bld: 108 mg/dL — ABNORMAL HIGH (ref 65–99)
Potassium: 3.4 mmol/L — ABNORMAL LOW (ref 3.5–5.1)
Sodium: 137 mmol/L (ref 135–145)

## 2016-03-23 LAB — BRAIN NATRIURETIC PEPTIDE: B Natriuretic Peptide: 337.4 pg/mL — ABNORMAL HIGH (ref 0.0–100.0)

## 2016-03-23 MED ORDER — TRAMADOL HCL 50 MG PO TABS: 50.0000 mg | ORAL_TABLET | Freq: Four times a day (QID) | ORAL | 0 refills | Status: AC

## 2016-03-23 NOTE — Patient Instructions (Signed)
Routine lab work today. Will notify you of abnormal results, otherwise no news is good news!  Refilled tramadol four times daily for a 30 day supply with no refills.  Follow up 4-6 weeks with Dr. Haroldine Laws.  Do the following things EVERYDAY: 1) Weigh yourself in the morning before breakfast. Write it down and keep it in a log. 2) Take your medicines as prescribed 3) Eat low salt foods-Limit salt (sodium) to 2000 mg per day.  4) Stay as active as you can everyday 5) Limit all fluids for the day to less than 2 liters

## 2016-03-23 NOTE — Progress Notes (Signed)
Advanced Heart Failure Clinic Note   Patient ID: [name], male   DOB: 12-Feb-1951, 65 y.o.   MRN: [id number]   PCP: Dr. Ashby Dawes  EP: Dr Lovena Le  Renal: Dr. Joelyn Oms  HPI: [name] is 64 y.o. male with past medical history of severe aortic stenosis s/p tissue AV replacement (09/2012) atrial fibrillation s/p MAZE (09/2012)  chronic combined systolic/diastolic HF, RV failure, HTN, PAD s/p L iliac stent in 2010 and aortobifemoral bypass graft, ETOH abuse, severe COPD with cor pulmonale on home oxygen, CAD s/p CABG with AV replacement and back pain from spinal stenosis.   Admitted and treated for acute heart failure complicated by listeria bacteremia and Afib RVR 09/2013. Diuresed with IV lasix transitioned to po lasix.He had TEE DC-CV but went back into afib with controlled rate on amiodarone and eliquis. He was treated with 2 weeks of antibiotics. Discharge weight was 226 pounds.  Amiodarone was stopped as patient did not hold NSR.    Patient was admitted to [facility] in 12/15 with right basal ganglia intracerebral hemorrhage and left hemiparesis.  He had been on Eliquis.  He eventually was sent to inpatient rehab, where due to a combination of ongoing diuretic use and poor po intake, he developed AKI and hyponatremia.  Diuretics and ACEI were held, and creatinine returned to baseline.    He presents today for follow up. Fluid much better. Breathing has been good.  Not taking any diuretics currently with AKI.  Strength returning, but very slowly.  Can't stand without help. 4-5 weeks ago could walk and mostly do things for himself. Working with HHPT and Therapist, sports.   Labs 03/17/16 Creatine 2.73 K 3.0  ECHO 05/2014 EF 50-55% bioprosthetic aortic valve ok very mild gradient RV normal size. Mild HK   ECHO 30-35% 08/2012  ECHO 08/2013 EF 50-55% Grade II DD  TEE 6/15 EF 40-45%, bioprosthetic aortic valve ok   PFTs 9/14 FEV1 1.1L (34%) FVC 2.57L (57%) FEF 25-75% (17%) DLCO 41%  Labs (9/15): LDL  106 Labs (05/05/14): K 3.4, creatinine 1.34, digoxin 0.4 Labs 06/10/2014: K 5.6 Creatinine 2.7 BNP 215 Labs 06/19/2014: K 4.9 Creatinine 2.28 K and spiro stopped.  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 Drink alcohol occasionally . Denies drug use. Lives alone   FH: Father died MI at 20 [name] MI   ROS: All systems negative except as listed in HPI, PMH and Problem List.  Past Medical History:  Diagnosis Date  . Aortic stenosis    echo 06/19/08 with nomal LV function, moderate concentric LVH moderate aortic stenosis area 0.99 cm squared, peak gradient of 50 and mean of 31  . Aortic stenosis, severe 09/23/2012  . AS (aortic stenosis) with AVR with pericardial tissue valve  09/30/2013  . Atrial fibrillation with RVR, was in atrial fib in 04/2011 09/16/2012   a) s/p MAZE (09/2012)   . Back pain, spinal stenosis 09/30/2013  . Bilateral carotid artery disease ([place])   . CAD (coronary artery disease), single vessel disease 09/23/2012   a) CABG (SVG to PDA and PL) wtih AVR (09/2012)   . Chronic combined systolic and diastolic CHF (congestive heart failure) ([place]) 09/16/2012   a) ECHO (08/2013) EF 50-55%, grade II DD b) TEE ECHO (09/2013): 40-45%, bioprosthesis present, mild Dustin  . COPD (chronic obstructive pulmonary disease) ([place])   . Gout   . Gout flare. 09/29/12, Lt knee, improved with colchicine. 09/30/2012  . H/O aortic valve  replacement   . Heart murmur   . Hyperkalemia 11/03/2015  . Hyperlipidemia   . Hypertension   . Need for hepatitis C screening test 11/05/2015  . PAD (peripheral artery disease), decreased bil. ABIs 09/23/2012   a) ABIs (11/2013): Right mild arterial insufficiency, L normal; aroto-bifem bypass graft: not well visualized, bilateral SFAs = to greater than 50% in dimaeter reduction   . Stroke ([place])   . Tobacco use 09/16/2012    Current Outpatient Prescriptions  Medication Sig Dispense Refill  . albuterol (PROVENTIL HFA;VENTOLIN HFA) 108 (90  BASE) MCG/ACT inhaler Inhale 2 puffs into the lungs every 6 (six) hours as needed for wheezing or shortness of breath. 1 Inhaler 2  . amiodarone (PACERONE) 200 MG tablet Take 0.5 tablets (100 mg total) by mouth daily. 30 tablet 6  . apixaban (ELIQUIS) 5 MG TABS tablet Take 1 tablet (5 mg total) by mouth 2 (two) times daily. 180 tablet 3  . carvedilol (COREG) 25 MG tablet Take 0.5 tablets (12.5 mg total) by mouth 2 (two) times daily. 30 tablet 8  . diclofenac sodium (VOLTAREN) 1 % GEL Apply 4 g topically 4 (four) times daily.    . Fe Fum-FePoly-Vit C-Vit B3 (INTEGRA) 62.5-62.5-40-3 MG CAPS Take 1 capsule by mouth daily.   4  . folic acid (FOLVITE) 1 MG tablet Take 1 tablet (1 mg total) by mouth daily. 30 tablet 6  . metolazone (ZAROXOLYN) 2.5 MG tablet Take 1 tablet (2.5 mg total) by mouth as directed. 5 tablet 3  . OXYGEN-HELIUM IN Inhale 1-2 L into the lungs continuous. Is in the habit of not using his oxygen when he leaves the house. Uses 1L continuous normally Uses 2L for exertion    . pantoprazole (PROTONIX) 40 MG tablet Take 40 mg by mouth daily.    Marland Kitchen tiZANidine (ZANAFLEX) 4 MG tablet Take 1 tablet by mouth 2 (two) times daily.  5  . traMADol (ULTRAM) 50 MG tablet Take 1 tablet (50 mg total) by mouth 4 (four) times daily. 120 tablet 0  . atorvastatin (LIPITOR) 80 MG tablet TAKE 1 TABLET EVERY DAY AT 6PM 30 tablet 2  . torsemide (DEMADEX) 20 MG tablet Take 20 mg by mouth daily.     No current facility-administered medications for this encounter.    Vitals:   03/23/16 0946  BP: (!) 118/52  BP Location: Right Arm  Patient Position: Sitting  Cuff Size: Normal  Pulse: (!) 56  SpO2: 95%  Weight: 140 lb (63.5 kg)    Wt Readings from Last 3 Encounters:  03/23/16 140 lb (63.5 kg)  03/07/16 164 lb (74.4 kg)  01/24/16 152 lb 4.8 oz (69.1 kg)     PHYSICAL EXAM: General:  Elderly appearing. Seated in WC. Son present.  HEENT: Normal Neck: supple. JVP 6-7 cm, Carotids 2+ bilaterally; no  bruits. No thyromegaly or nodule noted.  Cor: PMI normal. Brady, regular, 2/6 SEM RUSB.  Lungs: Barrel chested. Diminished throughout.  Abdomen: soft, NT, ND, no HSM. No bruits or masses. +BS  Extremities: no cyanosis, clubbing, rash. 1-2+ chronic edema with legs wrapped. LLE partial thickness wounds  Neuro: alert & orientedx3, cranial nerves grossly intact. Left-sided weakness.    ASSESSMENT & PLAN:  1.Acute/Chronic  Biventricular HF (R>L): Suspect mixed ischemic/nonischemic cardiomyopathy,  EF 40-45% on 6/15 TEE. >> Echo 06/10/2015 LVEF 55%, RV improved from previous.  -Functional status limited due to previous ICH/stroke. Mostly sedentary. - Volume status looks OK centrally. Significant LE edema remains, likely related  to venous insufficiency/lymphedema.  - Keep off torsemide for now.  Will likely just need to just use as needed as his weight comes back up. BMET today.  - Continue carvedilol 12.5 BID.  -Discussed low salt food choices and limiting fluid intake to < 2 liters per day.  BMET today.  2. Severe COPD with cor pulmonale:  - Uses 02 continuously.  Sees Dr. Lake Bells.   3 PAF: s/p MAZE. Failed DC-CV x3 in November 2014 and June 2015. However, he was in NSR in 12/15 at time of hospital admission.   - Regular pulse. Continue amio 100 mg daily.  He will need yearly eye exam and  PFTs.  - Continue eliquis 5 mg BID . No bleeding problems. 4. CAD: s/p CABG bioprosthetic AVR.  Off ASA with Eliquis 5. H/o Intracerebral hemorrhage: Still has left-sided weakness. Continue eliquis per neuro.   6. CKD stage III- IV - Followed by Dr. Joelyn Oms.    - Creatinine markedly elevated last clinic and improved sense.  BMET today.  7. Deconditioning - Continue PT with [facility].  8. LE wounds: - Continue leg wraps. [facility] following.   Recheck BMET/BNP.  Keep UNNA boots in place and continue HHPT.     Follow up ~4-6 weeks with MD.   Shirley Friar, PA-C 03/23/2016   Total time spent > 25  minutes. Over half that spent discussing the above.

## 2016-03-24 DIAGNOSIS — I5022 Chronic systolic (congestive) heart failure: Secondary | ICD-10-CM | POA: Diagnosis not present

## 2016-03-24 DIAGNOSIS — N183 Chronic kidney disease, stage 3 (moderate): Secondary | ICD-10-CM | POA: Diagnosis not present

## 2016-03-24 DIAGNOSIS — I2510 Atherosclerotic heart disease of native coronary artery without angina pectoris: Secondary | ICD-10-CM | POA: Diagnosis not present

## 2016-03-24 DIAGNOSIS — I130 Hypertensive heart and chronic kidney disease with heart failure and stage 1 through stage 4 chronic kidney disease, or unspecified chronic kidney disease: Secondary | ICD-10-CM | POA: Diagnosis not present

## 2016-03-24 DIAGNOSIS — D631 Anemia in chronic kidney disease: Secondary | ICD-10-CM | POA: Diagnosis not present

## 2016-03-24 DIAGNOSIS — L89152 Pressure ulcer of sacral region, stage 2: Secondary | ICD-10-CM | POA: Diagnosis not present

## 2016-03-27 DIAGNOSIS — H2513 Age-related nuclear cataract, bilateral: Secondary | ICD-10-CM | POA: Diagnosis not present

## 2016-03-27 DIAGNOSIS — H40033 Anatomical narrow angle, bilateral: Secondary | ICD-10-CM | POA: Diagnosis not present

## 2016-03-28 DIAGNOSIS — I2510 Atherosclerotic heart disease of native coronary artery without angina pectoris: Secondary | ICD-10-CM | POA: Diagnosis not present

## 2016-03-28 DIAGNOSIS — L89152 Pressure ulcer of sacral region, stage 2: Secondary | ICD-10-CM | POA: Diagnosis not present

## 2016-03-28 DIAGNOSIS — I5022 Chronic systolic (congestive) heart failure: Secondary | ICD-10-CM | POA: Diagnosis not present

## 2016-03-28 DIAGNOSIS — I130 Hypertensive heart and chronic kidney disease with heart failure and stage 1 through stage 4 chronic kidney disease, or unspecified chronic kidney disease: Secondary | ICD-10-CM | POA: Diagnosis not present

## 2016-03-28 DIAGNOSIS — D631 Anemia in chronic kidney disease: Secondary | ICD-10-CM | POA: Diagnosis not present

## 2016-03-28 DIAGNOSIS — N183 Chronic kidney disease, stage 3 (moderate): Secondary | ICD-10-CM | POA: Diagnosis not present

## 2016-03-29 DIAGNOSIS — L89152 Pressure ulcer of sacral region, stage 2: Secondary | ICD-10-CM | POA: Diagnosis not present

## 2016-03-29 DIAGNOSIS — I2510 Atherosclerotic heart disease of native coronary artery without angina pectoris: Secondary | ICD-10-CM | POA: Diagnosis not present

## 2016-03-29 DIAGNOSIS — D631 Anemia in chronic kidney disease: Secondary | ICD-10-CM | POA: Diagnosis not present

## 2016-03-29 DIAGNOSIS — I130 Hypertensive heart and chronic kidney disease with heart failure and stage 1 through stage 4 chronic kidney disease, or unspecified chronic kidney disease: Secondary | ICD-10-CM | POA: Diagnosis not present

## 2016-03-29 DIAGNOSIS — N183 Chronic kidney disease, stage 3 (moderate): Secondary | ICD-10-CM | POA: Diagnosis not present

## 2016-03-29 DIAGNOSIS — I5022 Chronic systolic (congestive) heart failure: Secondary | ICD-10-CM | POA: Diagnosis not present

## 2016-03-31 DIAGNOSIS — N183 Chronic kidney disease, stage 3 (moderate): Secondary | ICD-10-CM | POA: Diagnosis not present

## 2016-03-31 DIAGNOSIS — I5022 Chronic systolic (congestive) heart failure: Secondary | ICD-10-CM | POA: Diagnosis not present

## 2016-03-31 DIAGNOSIS — L89152 Pressure ulcer of sacral region, stage 2: Secondary | ICD-10-CM | POA: Diagnosis not present

## 2016-03-31 DIAGNOSIS — D631 Anemia in chronic kidney disease: Secondary | ICD-10-CM | POA: Diagnosis not present

## 2016-03-31 DIAGNOSIS — I130 Hypertensive heart and chronic kidney disease with heart failure and stage 1 through stage 4 chronic kidney disease, or unspecified chronic kidney disease: Secondary | ICD-10-CM | POA: Diagnosis not present

## 2016-03-31 DIAGNOSIS — I2510 Atherosclerotic heart disease of native coronary artery without angina pectoris: Secondary | ICD-10-CM | POA: Diagnosis not present

## 2016-04-04 DIAGNOSIS — N183 Chronic kidney disease, stage 3 (moderate): Secondary | ICD-10-CM | POA: Diagnosis not present

## 2016-04-04 DIAGNOSIS — I5022 Chronic systolic (congestive) heart failure: Secondary | ICD-10-CM | POA: Diagnosis not present

## 2016-04-04 DIAGNOSIS — I2510 Atherosclerotic heart disease of native coronary artery without angina pectoris: Secondary | ICD-10-CM | POA: Diagnosis not present

## 2016-04-04 DIAGNOSIS — L89152 Pressure ulcer of sacral region, stage 2: Secondary | ICD-10-CM | POA: Diagnosis not present

## 2016-04-04 DIAGNOSIS — D631 Anemia in chronic kidney disease: Secondary | ICD-10-CM | POA: Diagnosis not present

## 2016-04-04 DIAGNOSIS — I130 Hypertensive heart and chronic kidney disease with heart failure and stage 1 through stage 4 chronic kidney disease, or unspecified chronic kidney disease: Secondary | ICD-10-CM | POA: Diagnosis not present

## 2016-04-05 ENCOUNTER — Encounter (HOSPITAL_COMMUNITY): Payer: Self-pay | Admitting: Internal Medicine

## 2016-04-06 DIAGNOSIS — L89152 Pressure ulcer of sacral region, stage 2: Secondary | ICD-10-CM | POA: Diagnosis not present

## 2016-04-06 DIAGNOSIS — N183 Chronic kidney disease, stage 3 (moderate): Secondary | ICD-10-CM | POA: Diagnosis not present

## 2016-04-06 DIAGNOSIS — I5022 Chronic systolic (congestive) heart failure: Secondary | ICD-10-CM | POA: Diagnosis not present

## 2016-04-06 DIAGNOSIS — I2510 Atherosclerotic heart disease of native coronary artery without angina pectoris: Secondary | ICD-10-CM | POA: Diagnosis not present

## 2016-04-06 DIAGNOSIS — I130 Hypertensive heart and chronic kidney disease with heart failure and stage 1 through stage 4 chronic kidney disease, or unspecified chronic kidney disease: Secondary | ICD-10-CM | POA: Diagnosis not present

## 2016-04-06 DIAGNOSIS — D631 Anemia in chronic kidney disease: Secondary | ICD-10-CM | POA: Diagnosis not present

## 2016-04-07 DIAGNOSIS — I130 Hypertensive heart and chronic kidney disease with heart failure and stage 1 through stage 4 chronic kidney disease, or unspecified chronic kidney disease: Secondary | ICD-10-CM | POA: Diagnosis not present

## 2016-04-07 DIAGNOSIS — D631 Anemia in chronic kidney disease: Secondary | ICD-10-CM | POA: Diagnosis not present

## 2016-04-07 DIAGNOSIS — N183 Chronic kidney disease, stage 3 (moderate): Secondary | ICD-10-CM | POA: Diagnosis not present

## 2016-04-07 DIAGNOSIS — I5022 Chronic systolic (congestive) heart failure: Secondary | ICD-10-CM | POA: Diagnosis not present

## 2016-04-07 DIAGNOSIS — I2510 Atherosclerotic heart disease of native coronary artery without angina pectoris: Secondary | ICD-10-CM | POA: Diagnosis not present

## 2016-04-07 DIAGNOSIS — L89152 Pressure ulcer of sacral region, stage 2: Secondary | ICD-10-CM | POA: Diagnosis not present

## 2016-04-11 ENCOUNTER — Telehealth (HOSPITAL_COMMUNITY): Payer: Self-pay | Admitting: *Deleted

## 2016-04-11 DIAGNOSIS — N183 Chronic kidney disease, stage 3 (moderate): Secondary | ICD-10-CM | POA: Diagnosis not present

## 2016-04-11 DIAGNOSIS — I2510 Atherosclerotic heart disease of native coronary artery without angina pectoris: Secondary | ICD-10-CM | POA: Diagnosis not present

## 2016-04-11 DIAGNOSIS — D631 Anemia in chronic kidney disease: Secondary | ICD-10-CM | POA: Diagnosis not present

## 2016-04-11 DIAGNOSIS — I5022 Chronic systolic (congestive) heart failure: Secondary | ICD-10-CM | POA: Diagnosis not present

## 2016-04-11 DIAGNOSIS — L89152 Pressure ulcer of sacral region, stage 2: Secondary | ICD-10-CM | POA: Diagnosis not present

## 2016-04-11 DIAGNOSIS — I130 Hypertensive heart and chronic kidney disease with heart failure and stage 1 through stage 4 chronic kidney disease, or unspecified chronic kidney disease: Secondary | ICD-10-CM | POA: Diagnosis not present

## 2016-04-11 MED ORDER — DOXYCYCLINE HYCLATE 100 MG PO TABS: 100.0000 mg | ORAL_TABLET | Freq: Two times a day (BID) | ORAL | 0 refills | Status: AC

## 2016-04-11 NOTE — Telephone Encounter (Signed)
LATE ENTRY:  Pt's HHRN called earlier today concerned about pt's LE, she states the left leg is weeping and is more red that it has been, she is concerned about possible cellulitis.  Discussed w/Amy Ninfa Meeker, NP she recommends putting pt on Doxycycline 100 mg BID for 7 days and HH should monitor frequently.  HHRN aware, she will advise pt to get RX today and will see him again on Thur to re-evaluate

## 2016-04-11 NOTE — Telephone Encounter (Signed)
[place] called to get VO to extend PT for 3 more weeks, gave VO per Dr Haroldine Laws

## 2016-04-13 DIAGNOSIS — L89152 Pressure ulcer of sacral region, stage 2: Secondary | ICD-10-CM | POA: Diagnosis not present

## 2016-04-13 DIAGNOSIS — I5022 Chronic systolic (congestive) heart failure: Secondary | ICD-10-CM | POA: Diagnosis not present

## 2016-04-13 DIAGNOSIS — N183 Chronic kidney disease, stage 3 (moderate): Secondary | ICD-10-CM | POA: Diagnosis not present

## 2016-04-13 DIAGNOSIS — I2510 Atherosclerotic heart disease of native coronary artery without angina pectoris: Secondary | ICD-10-CM | POA: Diagnosis not present

## 2016-04-13 DIAGNOSIS — D631 Anemia in chronic kidney disease: Secondary | ICD-10-CM | POA: Diagnosis not present

## 2016-04-13 DIAGNOSIS — I130 Hypertensive heart and chronic kidney disease with heart failure and stage 1 through stage 4 chronic kidney disease, or unspecified chronic kidney disease: Secondary | ICD-10-CM | POA: Diagnosis not present

## 2016-04-14 ENCOUNTER — Other Ambulatory Visit (HOSPITAL_COMMUNITY): Payer: Self-pay | Admitting: Internal Medicine

## 2016-04-14 DIAGNOSIS — I2510 Atherosclerotic heart disease of native coronary artery without angina pectoris: Secondary | ICD-10-CM | POA: Diagnosis not present

## 2016-04-14 DIAGNOSIS — I5022 Chronic systolic (congestive) heart failure: Secondary | ICD-10-CM | POA: Diagnosis not present

## 2016-04-14 DIAGNOSIS — L89152 Pressure ulcer of sacral region, stage 2: Secondary | ICD-10-CM | POA: Diagnosis not present

## 2016-04-14 DIAGNOSIS — N183 Chronic kidney disease, stage 3 (moderate): Secondary | ICD-10-CM | POA: Diagnosis not present

## 2016-04-14 DIAGNOSIS — I130 Hypertensive heart and chronic kidney disease with heart failure and stage 1 through stage 4 chronic kidney disease, or unspecified chronic kidney disease: Secondary | ICD-10-CM | POA: Diagnosis not present

## 2016-04-14 DIAGNOSIS — D631 Anemia in chronic kidney disease: Secondary | ICD-10-CM | POA: Diagnosis not present

## 2016-04-19 DIAGNOSIS — N183 Chronic kidney disease, stage 3 (moderate): Secondary | ICD-10-CM | POA: Diagnosis not present

## 2016-04-19 DIAGNOSIS — D631 Anemia in chronic kidney disease: Secondary | ICD-10-CM | POA: Diagnosis not present

## 2016-04-19 DIAGNOSIS — L89152 Pressure ulcer of sacral region, stage 2: Secondary | ICD-10-CM | POA: Diagnosis not present

## 2016-04-19 DIAGNOSIS — I5022 Chronic systolic (congestive) heart failure: Secondary | ICD-10-CM | POA: Diagnosis not present

## 2016-04-19 DIAGNOSIS — I2510 Atherosclerotic heart disease of native coronary artery without angina pectoris: Secondary | ICD-10-CM | POA: Diagnosis not present

## 2016-04-19 DIAGNOSIS — I130 Hypertensive heart and chronic kidney disease with heart failure and stage 1 through stage 4 chronic kidney disease, or unspecified chronic kidney disease: Secondary | ICD-10-CM | POA: Diagnosis not present

## 2016-04-25 DIAGNOSIS — I130 Hypertensive heart and chronic kidney disease with heart failure and stage 1 through stage 4 chronic kidney disease, or unspecified chronic kidney disease: Secondary | ICD-10-CM | POA: Diagnosis not present

## 2016-04-25 DIAGNOSIS — N183 Chronic kidney disease, stage 3 (moderate): Secondary | ICD-10-CM | POA: Diagnosis not present

## 2016-04-25 DIAGNOSIS — I5022 Chronic systolic (congestive) heart failure: Secondary | ICD-10-CM | POA: Diagnosis not present

## 2016-04-25 DIAGNOSIS — I2510 Atherosclerotic heart disease of native coronary artery without angina pectoris: Secondary | ICD-10-CM | POA: Diagnosis not present

## 2016-04-25 DIAGNOSIS — L89152 Pressure ulcer of sacral region, stage 2: Secondary | ICD-10-CM | POA: Diagnosis not present

## 2016-04-25 DIAGNOSIS — D631 Anemia in chronic kidney disease: Secondary | ICD-10-CM | POA: Diagnosis not present

## 2016-04-26 DIAGNOSIS — I2510 Atherosclerotic heart disease of native coronary artery without angina pectoris: Secondary | ICD-10-CM | POA: Diagnosis not present

## 2016-04-26 DIAGNOSIS — I638 Other cerebral infarction: Secondary | ICD-10-CM | POA: Diagnosis not present

## 2016-04-26 DIAGNOSIS — E782 Mixed hyperlipidemia: Secondary | ICD-10-CM | POA: Diagnosis not present

## 2016-04-26 DIAGNOSIS — D500 Iron deficiency anemia secondary to blood loss (chronic): Secondary | ICD-10-CM | POA: Diagnosis not present

## 2016-04-27 DIAGNOSIS — D631 Anemia in chronic kidney disease: Secondary | ICD-10-CM | POA: Diagnosis not present

## 2016-04-27 DIAGNOSIS — I5022 Chronic systolic (congestive) heart failure: Secondary | ICD-10-CM | POA: Diagnosis not present

## 2016-04-27 DIAGNOSIS — L89152 Pressure ulcer of sacral region, stage 2: Secondary | ICD-10-CM | POA: Diagnosis not present

## 2016-04-27 DIAGNOSIS — I130 Hypertensive heart and chronic kidney disease with heart failure and stage 1 through stage 4 chronic kidney disease, or unspecified chronic kidney disease: Secondary | ICD-10-CM | POA: Diagnosis not present

## 2016-04-27 DIAGNOSIS — I2510 Atherosclerotic heart disease of native coronary artery without angina pectoris: Secondary | ICD-10-CM | POA: Diagnosis not present

## 2016-04-27 DIAGNOSIS — N183 Chronic kidney disease, stage 3 (moderate): Secondary | ICD-10-CM | POA: Diagnosis not present

## 2016-04-28 ENCOUNTER — Telehealth: Payer: Self-pay | Admitting: Hematology and Oncology

## 2016-04-28 ENCOUNTER — Telehealth: Payer: Self-pay | Admitting: *Deleted

## 2016-04-28 ENCOUNTER — Ambulatory Visit (HOSPITAL_BASED_OUTPATIENT_CLINIC_OR_DEPARTMENT_OTHER): Payer: Medicare Other | Admitting: Hematology and Oncology

## 2016-04-28 ENCOUNTER — Other Ambulatory Visit (HOSPITAL_BASED_OUTPATIENT_CLINIC_OR_DEPARTMENT_OTHER): Payer: Medicare Other

## 2016-04-28 ENCOUNTER — Encounter: Payer: Self-pay | Admitting: Hematology and Oncology

## 2016-04-28 VITALS — BP 116/60 | HR 53 | Temp 98.0°F | Resp 17 | Ht 70.0 in | Wt 164.0 lb

## 2016-04-28 DIAGNOSIS — E538 Deficiency of other specified B group vitamins: Secondary | ICD-10-CM | POA: Insufficient documentation

## 2016-04-28 DIAGNOSIS — N189 Chronic kidney disease, unspecified: Secondary | ICD-10-CM

## 2016-04-28 DIAGNOSIS — D631 Anemia in chronic kidney disease: Secondary | ICD-10-CM

## 2016-04-28 DIAGNOSIS — D61818 Other pancytopenia: Secondary | ICD-10-CM

## 2016-04-28 DIAGNOSIS — D509 Iron deficiency anemia, unspecified: Secondary | ICD-10-CM

## 2016-04-28 DIAGNOSIS — N183 Chronic kidney disease, stage 3 unspecified: Secondary | ICD-10-CM

## 2016-04-28 DIAGNOSIS — D508 Other iron deficiency anemias: Secondary | ICD-10-CM

## 2016-04-28 LAB — CBC & DIFF AND RETIC
BASO%: 0.8 % (ref 0.0–2.0)
Basophils Absolute: 0 10*3/uL (ref 0.0–0.1)
EOS%: 4.4 % (ref 0.0–7.0)
Eosinophils Absolute: 0.2 10*3/uL (ref 0.0–0.5)
HCT: 26.3 % — ABNORMAL LOW (ref 38.4–49.9)
HGB: 8.1 g/dL — ABNORMAL LOW (ref 13.0–17.1)
Immature Retic Fract: 4.7 % (ref 3.00–10.60)
LYMPH%: 29.2 % (ref 14.0–49.0)
MCH: 28.8 pg (ref 27.2–33.4)
MCHC: 30.8 g/dL — AB (ref 32.0–36.0)
MCV: 93.6 fL (ref 79.3–98.0)
MONO#: 0.5 10*3/uL (ref 0.1–0.9)
MONO%: 13.1 % (ref 0.0–14.0)
NEUT%: 52.5 % (ref 39.0–75.0)
NEUTROS ABS: 1.9 10*3/uL (ref 1.5–6.5)
PLATELETS: 122 10*3/uL — AB (ref 140–400)
RBC: 2.81 10*6/uL — AB (ref 4.20–5.82)
RDW: 14.6 % (ref 11.0–14.6)
Retic %: 2.73 % — ABNORMAL HIGH (ref 0.80–1.80)
Retic Ct Abs: 76.71 10*3/uL (ref 34.80–93.90)
WBC: 3.7 10*3/uL — AB (ref 4.0–10.3)
lymph#: 1.1 10*3/uL (ref 0.9–3.3)
nRBC: 0 % (ref 0–0)

## 2016-04-28 LAB — IRON AND TIBC
%SAT: 19 % — ABNORMAL LOW (ref 20–55)
IRON: 61 ug/dL (ref 42–163)
TIBC: 330 ug/dL (ref 202–409)
UIBC: 269 ug/dL (ref 117–376)

## 2016-04-28 LAB — FERRITIN: Ferritin: 67 ng/ml (ref 22–316)

## 2016-04-28 NOTE — Progress Notes (Signed)
[place], MD SUMMARY OF HEMATOLOGIC HISTORY:  [name] 66 y.o. male was seen on 11/01/15 because of pancytopenia. The patient is here today accompanied by his brother. The patient has significant major comorbidities including history of stroke causing persistent left-sided weakness, chronic respiratory failure secondary to COPD on chronic oxygen therapy for the past 4 years, chronic atrial fibrillation and significant peripheral vascular disease on chronic anticoagulation therapy and chronic kidney disease. I had the opportunity to review his blood work from 2010. At the time, he has normal CBC. Starting around the end of 2014, he started to develop progressive anemia. Subsequently, starting around May 2016, he started to have progressive thrombocytopenia. According to scanned medical records, he had multiple blood tests done at close interval since September 2016. WBC has reduced from the 3 range down to 2.8 most recently on 10/11/2015. His hemoglobin has drifted down to 8.5 with high MCV and platelet count has drifted down to 85,000 Serum folate and vitamin B-12 were within normal limits. He had iron studies done with serum ferritin at 60. The patient has been placed on iron supplement for some time without significant benefit. On 11/03/2015, he had bone marrow aspirate and biopsy which excluded myelodysplastic syndrome On 12/15/15, CT chest, abdomen and pelvis showed no CT findings to suggest cirrhosis.  He received intravenous iron in September 2017  INTERVAL HISTORY: [name] 66 y.o. male returns for further follow-up. He feels well. The patient denies any recent signs or symptoms of bleeding such as spontaneous epistaxis, hematuria or hematochezia. Denies recent infection He follows with nephrologist  I have reviewed the past medical history, past surgical history, social history and family history with the  patient and they are unchanged from previous note.  ALLERGIES:  has No Known Allergies.  MEDICATIONS:  Current Outpatient Prescriptions  Medication Sig Dispense Refill  . albuterol (PROVENTIL HFA;VENTOLIN HFA) 108 (90 BASE) MCG/ACT inhaler Inhale 2 puffs into the lungs every 6 (six) hours as needed for wheezing or shortness of breath. 1 Inhaler 2  . amiodarone (PACERONE) 200 MG tablet Take 0.5 tablets (100 mg total) by mouth daily. 30 tablet 6  . apixaban (ELIQUIS) 5 MG TABS tablet Take 1 tablet (5 mg total) by mouth 2 (two) times daily. 180 tablet 3  . atorvastatin (LIPITOR) 80 MG tablet TAKE 1 TABLET EVERY DAY AT 6PM 30 tablet 2  . carvedilol (COREG) 25 MG tablet Take 0.5 tablets (12.5 mg total) by mouth 2 (two) times daily. 30 tablet 8  . diclofenac sodium (VOLTAREN) 1 % GEL Apply 4 g topically 4 (four) times daily.    . Fe Fum-FePoly-Vit C-Vit B3 (INTEGRA) 62.5-62.5-40-3 MG CAPS Take 1 capsule by mouth daily.   4  . folic acid (FOLVITE) 1 MG tablet Take 1 tablet (1 mg total) by mouth daily. 30 tablet 6  . OXYGEN-HELIUM IN Inhale 1-2 L into the lungs continuous. Is in the habit of not using his oxygen when he leaves the house. Uses 1L continuous normally Uses 2L for exertion    . pantoprazole (PROTONIX) 40 MG tablet Take 40 mg by mouth daily.    Marland Kitchen tiZANidine (ZANAFLEX) 4 MG tablet Take 1 tablet by mouth 2 (two) times daily.  5  . traMADol (ULTRAM) 50 MG tablet Take 1 tablet (50 mg total) by mouth 4 (four) times daily. 120 tablet 0   No current facility-administered medications for this visit.      REVIEW  OF SYSTEMS:   Constitutional: Denies fevers, chills or night sweats Eyes: Denies blurriness of vision Ears, nose, mouth, throat, and face: Denies mucositis or sore throat Respiratory: Denies cough, dyspnea or wheezes Cardiovascular: Denies palpitation, chest discomfort or lower extremity swelling Gastrointestinal:  Denies nausea, heartburn or change in bowel habits Skin: Denies  abnormal skin rashes Lymphatics: Denies new lymphadenopathy or easy bruising Neurological:Denies numbness, tingling or new weaknesses Behavioral/Psych: Mood is stable, no new changes  All other systems were reviewed with the patient and are negative.  PHYSICAL EXAMINATION: ECOG PERFORMANCE STATUS: 2 - Symptomatic, <50% confined to bed  Vitals:   04/28/16 1008 04/28/16 1010  BP: (!) 130/26 116/60  Pulse: (!) 53   Resp: 17   Temp: 98 F (36.7 C)    Filed Weights   04/28/16 1008  Weight: 164 lb (74.4 kg)    GENERAL:alert, no distress and comfortable SKIN: skin color, texture, turgor are normal, no rashes or significant lesions EYES: normal, Conjunctiva are pink and non-injected, sclera clear Musculoskeletal:no cyanosis of digits and no clubbing  NEURO: alert & oriented x 3 with fluent speech, no focal motor/sensory deficits  LABORATORY DATA:  I have reviewed the data as listed     Component Value Date/Time   NA 137 03/23/2016 1018   NA 140 12/15/2015 0855   K 3.4 (L) 03/23/2016 1018   K 5.5 No visable hemolysis (H) 01/17/2016 1036   CL 100 (L) 03/23/2016 1018   CO2 29 03/23/2016 1018   CO2 24 12/15/2015 0855   GLUCOSE 108 (H) 03/23/2016 1018   GLUCOSE 82 12/15/2015 0855   BUN 35 (H) 03/23/2016 1018   BUN 28.5 (H) 12/15/2015 0855   CREATININE 1.95 (H) 03/23/2016 1018   CREATININE 1.6 (H) 12/15/2015 0855   CALCIUM 8.2 (L) 03/23/2016 1018   CALCIUM 8.9 12/15/2015 0855   PROT 6.9 12/15/2015 0855   ALBUMIN 2.9 (L) 12/15/2015 0855   AST 56 (H) 12/15/2015 0855   ALT 33 12/15/2015 0855   ALKPHOS 82 12/15/2015 0855   BILITOT 0.38 12/15/2015 0855   GFRNONAA 34 (L) 03/23/2016 1018   GFRNONAA 57 (L) 10/23/2013 1007   GFRAA 39 (L) 03/23/2016 1018   GFRAA 66 10/23/2013 1007    No results found for: SPEP, UPEP  Lab Results  Component Value Date   WBC 3.7 (L) 04/28/2016   NEUTROABS 1.9 04/28/2016   HGB 8.1 (L) 04/28/2016   HCT 26.3 (L) 04/28/2016   MCV 93.6 04/28/2016    PLT 122 (L) 04/28/2016      Chemistry      Component Value Date/Time   NA 137 03/23/2016 1018   NA 140 12/15/2015 0855   K 3.4 (L) 03/23/2016 1018   K 5.5 No visable hemolysis (H) 01/17/2016 1036   CL 100 (L) 03/23/2016 1018   CO2 29 03/23/2016 1018   CO2 24 12/15/2015 0855   BUN 35 (H) 03/23/2016 1018   BUN 28.5 (H) 12/15/2015 0855   CREATININE 1.95 (H) 03/23/2016 1018   CREATININE 1.6 (H) 12/15/2015 0855      Component Value Date/Time   CALCIUM 8.2 (L) 03/23/2016 1018   CALCIUM 8.9 12/15/2015 0855   ALKPHOS 82 12/15/2015 0855   AST 56 (H) 12/15/2015 0855   ALT 33 12/15/2015 0855   BILITOT 0.38 12/15/2015 0855      ASSESSMENT & PLAN:  Pancytopenia, acquired ([place]) His bone marrow biopsy showed no signs of myelodysplastic syndrome or lymphoma. CT scan of the abdomen showed no evidence  of liver cirrhosis or splenomegaly. Overall, I think the mild leukopenia and thrombocytopenia could be due to very early signs of myelodysplastic syndrome but he is not symptomatic and would not require treatment for that.  It is conceivable that he had anemia due to iron deficiency from bleeding. Since he is barely responding to oral iron therapy, I would prescribe intravenous iron for him  After intravenous iron infusion, If repeat hemoglobin is less than 10, he may benefit from darbepoetin injection I plan to see him back next month  Iron deficiency anemia The most likely cause of his anemia is due to chronic blood loss/malabsorption syndrome. We discussed some of the risks, benefits, and alternatives of intravenous iron infusions. The patient is symptomatic from anemia and the iron level is critically low. He tolerated oral iron supplement poorly and desires to achieved higher levels of iron faster for adequate hematopoesis. Some of the side-effects to be expected including risks of infusion reactions, phlebitis, headaches, nausea and fatigue.  The patient is willing to proceed. Patient  education material was dispensed.  Goal is to keep iron saturation > 30%   Chronic kidney disease, stage III (moderate) The patient has chronic kidney disease stage III. His blood pressure is under control without lisinopril   Orders Placed This Encounter  Procedures  . CBC & Diff and Retic    Standing Status:   Future    Standing Expiration Date:   06/02/2017  . Ferritin    Standing Status:   Future    Standing Expiration Date:   06/02/2017  . Vitamin B12    Standing Status:   Future    Standing Expiration Date:   06/02/2017  . Iron and TIBC    Standing Status:   Future    Standing Expiration Date:   06/02/2017  . Erythropoietin    Standing Status:   Future    Standing Expiration Date:   06/02/2017  . Vitamin B12    Standing Status:   Future    Standing Expiration Date:   06/02/2017    All questions were answered. The patient knows to call the clinic with any problems, questions or concerns. No barriers to learning was detected.  I spent 15 minutes counseling the patient face to face. The total time spent in the appointment was 20 minutes and more than 50% was on counseling.     Heath Lark, MD 1/5/201810:44 PM

## 2016-04-28 NOTE — Assessment & Plan Note (Signed)
The patient has chronic kidney disease stage III. His blood pressure is under control without lisinopril

## 2016-04-28 NOTE — Telephone Encounter (Signed)
Appointments scheduled per 1/5 LOS. Patient given AVS report and calendars with future scheduled appointments. °

## 2016-04-28 NOTE — Telephone Encounter (Addendum)
Per 1/5 LOS I have scheduled appts and notified the scheduler 

## 2016-04-28 NOTE — Assessment & Plan Note (Signed)
The most likely cause of his anemia is due to chronic blood loss/malabsorption syndrome. We discussed some of the risks, benefits, and alternatives of intravenous iron infusions. The patient is symptomatic from anemia and the iron level is critically low. He tolerated oral iron supplement poorly and desires to achieved higher levels of iron faster for adequate hematopoesis. Some of the side-effects to be expected including risks of infusion reactions, phlebitis, headaches, nausea and fatigue.  The patient is willing to proceed. Patient education material was dispensed.  Goal is to keep iron saturation > 30%

## 2016-04-28 NOTE — Assessment & Plan Note (Signed)
His bone marrow biopsy showed no signs of myelodysplastic syndrome or lymphoma. CT scan of the abdomen showed no evidence of liver cirrhosis or splenomegaly. Overall, I think the mild leukopenia and thrombocytopenia could be due to very early signs of myelodysplastic syndrome but he is not symptomatic and would not require treatment for that.  It is conceivable that he had anemia due to iron deficiency from bleeding. Since he is barely responding to oral iron therapy, I would prescribe intravenous iron for him  After intravenous iron infusion, If repeat hemoglobin is less than 10, he may benefit from darbepoetin injection I plan to see him back next month

## 2016-05-01 ENCOUNTER — Ambulatory Visit (HOSPITAL_COMMUNITY)
Admission: RE | Admit: 2016-05-01 | Discharge: 2016-05-01 | Disposition: A | Discharge status: Home / Self Care | Payer: Medicare Other | Source: Ambulatory Visit | Attending: Internal Medicine | Admitting: Internal Medicine

## 2016-05-01 ENCOUNTER — Encounter (HOSPITAL_COMMUNITY): Payer: Self-pay | Admitting: Internal Medicine

## 2016-05-01 VITALS — BP 103/54 | HR 57 | Wt 160.5 lb

## 2016-05-01 DIAGNOSIS — Z79899 Other long term (current) drug therapy: Secondary | ICD-10-CM | POA: Insufficient documentation

## 2016-05-01 DIAGNOSIS — I5042 Chronic combined systolic (congestive) and diastolic (congestive) heart failure: Secondary | ICD-10-CM | POA: Insufficient documentation

## 2016-05-01 DIAGNOSIS — I5032 Chronic diastolic (congestive) heart failure: Secondary | ICD-10-CM | POA: Diagnosis not present

## 2016-05-01 DIAGNOSIS — Z7901 Long term (current) use of anticoagulants: Secondary | ICD-10-CM | POA: Insufficient documentation

## 2016-05-01 DIAGNOSIS — Z9889 Other specified postprocedural states: Secondary | ICD-10-CM | POA: Diagnosis not present

## 2016-05-01 DIAGNOSIS — I480 Paroxysmal atrial fibrillation: Secondary | ICD-10-CM | POA: Insufficient documentation

## 2016-05-01 DIAGNOSIS — Z8673 Personal history of transient ischemic attack (TIA), and cerebral infarction without residual deficits: Secondary | ICD-10-CM | POA: Diagnosis not present

## 2016-05-01 DIAGNOSIS — Z953 Presence of xenogenic heart valve: Secondary | ICD-10-CM | POA: Insufficient documentation

## 2016-05-01 DIAGNOSIS — I2510 Atherosclerotic heart disease of native coronary artery without angina pectoris: Secondary | ICD-10-CM | POA: Insufficient documentation

## 2016-05-01 DIAGNOSIS — N183 Chronic kidney disease, stage 3 (moderate): Secondary | ICD-10-CM | POA: Insufficient documentation

## 2016-05-01 DIAGNOSIS — I130 Hypertensive heart and chronic kidney disease with heart failure and stage 1 through stage 4 chronic kidney disease, or unspecified chronic kidney disease: Secondary | ICD-10-CM | POA: Diagnosis not present

## 2016-05-01 DIAGNOSIS — Z9981 Dependence on supplemental oxygen: Secondary | ICD-10-CM | POA: Insufficient documentation

## 2016-05-01 DIAGNOSIS — I428 Other cardiomyopathies: Secondary | ICD-10-CM | POA: Insufficient documentation

## 2016-05-01 DIAGNOSIS — I2781 Cor pulmonale (chronic): Secondary | ICD-10-CM | POA: Insufficient documentation

## 2016-05-01 DIAGNOSIS — Z87891 Personal history of nicotine dependence: Secondary | ICD-10-CM | POA: Diagnosis not present

## 2016-05-01 DIAGNOSIS — Z951 Presence of aortocoronary bypass graft: Secondary | ICD-10-CM | POA: Insufficient documentation

## 2016-05-01 DIAGNOSIS — J449 Chronic obstructive pulmonary disease, unspecified: Secondary | ICD-10-CM | POA: Insufficient documentation

## 2016-05-01 NOTE — Addendum Note (Signed)
Encounter addended by: Scarlette Calico, RN on: 05/01/2016 10:27 AM<BR>    Actions taken: Sign clinical note

## 2016-05-01 NOTE — Progress Notes (Signed)
Advanced Heart Failure Clinic Note   Patient ID: [name], male   DOB: 12-Mar-1951, 66 y.o.   MRN: [id number]   PCP: Dr. Ashby Dawes  EP: Dr Lovena Le  Renal: Dr. Joelyn Oms  HPI: [name] is 66 y.o. male with past medical history of severe aortic stenosis s/p tissue AV replacement (09/2012) atrial fibrillation s/p MAZE (09/2012)  chronic combined systolic/diastolic HF, RV failure, HTN, PAD s/p L iliac stent in 2010 and aortobifemoral bypass graft, ETOH abuse, severe COPD with cor pulmonale on home oxygen, CAD s/p CABG with AV replacement and back pain from spinal stenosis.   Admitted and treated for acute heart failure complicated by listeria bacteremia and Afib RVR 09/2013. Diuresed with IV lasix transitioned to po lasix.He had TEE DC-CV but went back into afib with controlled rate on amiodarone and eliquis. He was treated with 2 weeks of antibiotics. Discharge weight was 226 pounds.  Amiodarone was stopped as patient did not hold NSR.    Patient was admitted to [facility] in 12/15 with right basal ganglia intracerebral hemorrhage and left hemiparesis.  He had been on Eliquis.  He eventually was sent to inpatient rehab, where due to a combination of ongoing diuretic use and poor po intake, he developed AKI and hyponatremia.  Diuretics and ACEI were held, and creatinine returned to baseline.    He presents today for follow up. About a month ago fluid was up restarted torsemide. But only took for 2-3 days and creatinine bumped so it was stopped. Fluid much better. Minimal edema. Breathing has been good.  Not taking any diuretics currently with AKI.  LLE is wrapped due to sore which has healed. Weight stable 160-161.Working with HHPT and Therapist, sports. Had bone marrow biopsy a few months ago with no evidence of MDS. Getting iron infusion for anemia.   Labs 03/17/16 Creatine 2.73 K 3.0 Labs 03/23/16 Creatine 1.95 K 3.4   ECHO 2/17 EF 55-60% RV down  ECHO 05/2014 EF 50-55% bioprosthetic aortic valve ok very mild  gradient RV normal size. Mild HK  ECHO 30-35% 08/2012  ECHO 08/2013 EF 50-55% Grade II DD  TEE 6/15 EF 40-45%, bioprosthetic aortic valve ok   PFTs 9/14 FEV1 1.1L (34%) FVC 2.57L (57%) FEF 25-75% (17%) DLCO 41%  Labs (9/15): LDL 106 Labs (05/05/14): K 3.4, creatinine 1.34, digoxin 0.4 Labs 06/10/2014: K 5.6 Creatinine 2.7 BNP 215 Labs 06/19/2014: K 4.9 Creatinine 2.28 K and spiro stopped.  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 Drink alcohol occasionally . Denies drug use. Lives alone   FH: Father died MI at 51 Brother MI   ROS: All systems negative except as listed in HPI, PMH and Problem List.  Past Medical History:  Diagnosis Date  . Aortic stenosis    echo 06/19/08 with nomal LV function, moderate concentric LVH moderate aortic stenosis area 0.99 cm squared, peak gradient of 50 and mean of 31  . Aortic stenosis, severe 09/23/2012  . AS (aortic stenosis) with AVR with pericardial tissue valve  09/30/2013  . Atrial fibrillation with RVR, was in atrial fib in 04/2011 09/16/2012   a) s/p MAZE (09/2012)   . Back pain, spinal stenosis 09/30/2013  . Bilateral carotid artery disease ([place])   . CAD (coronary artery disease), single vessel disease 09/23/2012   a) CABG (SVG to PDA and PL) wtih AVR (09/2012)   . Chronic combined systolic and diastolic CHF (congestive heart failure) ([place]) 09/16/2012   a)  ECHO (08/2013) EF 50-55%, grade II DD b) TEE ECHO (09/2013): 40-45%, bioprosthesis present, mild Dustin  . COPD (chronic obstructive pulmonary disease) ([place])   . Gout   . Gout flare. 09/29/12, Lt knee, improved with colchicine. 09/30/2012  . H/O aortic valve replacement   . Heart murmur   . Hyperkalemia 11/03/2015  . Hyperlipidemia   . Hypertension   . Need for hepatitis C screening test 11/05/2015  . PAD (peripheral artery disease), decreased bil. ABIs 09/23/2012   a) ABIs (11/2013): Right mild arterial insufficiency, L normal; aroto-bifem bypass graft: not well  visualized, bilateral SFAs = to greater than 50% in dimaeter reduction   . Stroke ([place])   . Tobacco use 09/16/2012    Current Outpatient Prescriptions  Medication Sig Dispense Refill  . albuterol (PROVENTIL HFA;VENTOLIN HFA) 108 (90 BASE) MCG/ACT inhaler Inhale 2 puffs into the lungs every 6 (six) hours as needed for wheezing or shortness of breath. 1 Inhaler 2  . amiodarone (PACERONE) 200 MG tablet Take 0.5 tablets (100 mg total) by mouth daily. 30 tablet 6  . apixaban (ELIQUIS) 5 MG TABS tablet Take 1 tablet (5 mg total) by mouth 2 (two) times daily. 180 tablet 3  . atorvastatin (LIPITOR) 80 MG tablet TAKE 1 TABLET EVERY DAY AT 6PM 30 tablet 2  . carvedilol (COREG) 25 MG tablet Take 0.5 tablets (12.5 mg total) by mouth 2 (two) times daily. 30 tablet 8  . diclofenac sodium (VOLTAREN) 1 % GEL Apply 4 g topically 4 (four) times daily.    . Fe Fum-FePoly-Vit C-Vit B3 (INTEGRA) 62.5-62.5-40-3 MG CAPS Take 1 capsule by mouth daily.   4  . folic acid (FOLVITE) 1 MG tablet Take 1 tablet (1 mg total) by mouth daily. 30 tablet 6  . OXYGEN-HELIUM IN Inhale 1-2 L into the lungs continuous. Is in the habit of not using his oxygen when he leaves the house. Uses 1L continuous normally Uses 2L for exertion    . pantoprazole (PROTONIX) 40 MG tablet Take 40 mg by mouth daily.    Marland Kitchen tiZANidine (ZANAFLEX) 4 MG tablet Take 1 tablet by mouth 2 (two) times daily.  5  . traMADol (ULTRAM) 50 MG tablet Take 1 tablet (50 mg total) by mouth 4 (four) times daily. 120 tablet 0   No current facility-administered medications for this encounter.    Vitals:   05/01/16 1002  BP: (!) 103/54  Pulse: (!) 57  SpO2: 98%  Weight: 160 lb 8 oz (72.8 kg)    Wt Readings from Last 3 Encounters:  05/01/16 160 lb 8 oz (72.8 kg)  04/28/16 164 lb (74.4 kg)  03/23/16 140 lb (63.5 kg)     PHYSICAL EXAM: General:  Elderly appearing. Seated in WC. Son present.  HEENT: Normal Neck: supple. JVP 6-7 cm, Carotids 2+ bilaterally; no  bruits. No thyromegaly or nodule noted.  Cor: PMI normal. Brady, regular, 2/6 SEM RUSB.  Lungs: Barrel chested. Diminished throughout.  Abdomen: soft, NT, ND, no HSM. No bruits or masses. +BS  Extremities: no cyanosis, clubbing, rash. 1 chronic edema with legs wrapped. LLE wrapped Neuro: alert & orientedx3, cranial nerves grossly intact. Left-sided weakness.    ASSESSMENT & PLAN:  1.Acute/Chronic  Biventricular HF (R>L): Suspect mixed ischemic/nonischemic cardiomyopathy,  EF 40-45% on 6/15 TEE. >> Echo 06/10/2015 LVEF 55%, RV improved from previous.  -Functional status limited due to previous ICH/stroke. Mostly sedentary. - Volume status looks OK centrally. Mild LE edema remains, likely related to venous insufficiency/lymphedema.  -  Keep off torsemide for now. Can use prn as his weight comes back up. BMET next week with Dr. Joelyn Oms. - Continue carvedilol 12.5 BID.  2. Severe COPD with cor pulmonale:  - Uses O2 continuously.  Sees Dr. [place] Bells.   3 PAF: s/p MAZE. Failed DC-CV x3 in November 2014 and June 2015. However, he was in NSR in 12/15 at time of hospital admission.   - Regular pulse. Continue amio 100 mg daily.  He will need yearly eye exam and  PFTs.  - Continue eliquis 5 mg BID . No bleeding problems. 4. CAD: s/p CABG bioprosthetic AVR.  Off ASA with Eliquis 5. H/o Intracerebral hemorrhage: Still has left-sided weakness. Continue eliquis per neuro.   6. CKD stage III- IV - Recently stable. Followed by Dr. Joelyn Oms.  BMET next week.   7. Deconditioning - Continue PT/HHRN with [facility].  8. LE wounds: - Resolved.  [facility] following.  9. S/p AVR - Looks stable on echo some question of a jet around the valve on last echo but TEE in 2015 ok. No AI on exam - Continue SBE prophylaxis  Glori Bickers, MD 05/01/2016

## 2016-05-01 NOTE — Patient Instructions (Signed)
We will contact you in 4 months to schedule your next appointment.  

## 2016-05-02 DIAGNOSIS — I130 Hypertensive heart and chronic kidney disease with heart failure and stage 1 through stage 4 chronic kidney disease, or unspecified chronic kidney disease: Secondary | ICD-10-CM | POA: Diagnosis not present

## 2016-05-02 DIAGNOSIS — L89152 Pressure ulcer of sacral region, stage 2: Secondary | ICD-10-CM | POA: Diagnosis not present

## 2016-05-02 DIAGNOSIS — I5022 Chronic systolic (congestive) heart failure: Secondary | ICD-10-CM | POA: Diagnosis not present

## 2016-05-02 DIAGNOSIS — I2510 Atherosclerotic heart disease of native coronary artery without angina pectoris: Secondary | ICD-10-CM | POA: Diagnosis not present

## 2016-05-02 DIAGNOSIS — D631 Anemia in chronic kidney disease: Secondary | ICD-10-CM | POA: Diagnosis not present

## 2016-05-02 DIAGNOSIS — N183 Chronic kidney disease, stage 3 (moderate): Secondary | ICD-10-CM | POA: Diagnosis not present

## 2016-05-04 DIAGNOSIS — D631 Anemia in chronic kidney disease: Secondary | ICD-10-CM | POA: Diagnosis not present

## 2016-05-04 DIAGNOSIS — I5022 Chronic systolic (congestive) heart failure: Secondary | ICD-10-CM | POA: Diagnosis not present

## 2016-05-04 DIAGNOSIS — I2510 Atherosclerotic heart disease of native coronary artery without angina pectoris: Secondary | ICD-10-CM | POA: Diagnosis not present

## 2016-05-04 DIAGNOSIS — L89152 Pressure ulcer of sacral region, stage 2: Secondary | ICD-10-CM | POA: Diagnosis not present

## 2016-05-04 DIAGNOSIS — N183 Chronic kidney disease, stage 3 (moderate): Secondary | ICD-10-CM | POA: Diagnosis not present

## 2016-05-04 DIAGNOSIS — I130 Hypertensive heart and chronic kidney disease with heart failure and stage 1 through stage 4 chronic kidney disease, or unspecified chronic kidney disease: Secondary | ICD-10-CM | POA: Diagnosis not present

## 2016-05-05 DIAGNOSIS — N183 Chronic kidney disease, stage 3 (moderate): Secondary | ICD-10-CM | POA: Diagnosis not present

## 2016-05-08 ENCOUNTER — Ambulatory Visit (HOSPITAL_BASED_OUTPATIENT_CLINIC_OR_DEPARTMENT_OTHER): Payer: Medicare Other

## 2016-05-08 ENCOUNTER — Other Ambulatory Visit (HOSPITAL_COMMUNITY): Payer: Self-pay | Admitting: Internal Medicine

## 2016-05-08 VITALS — BP 153/44 | HR 50 | Temp 97.7°F | Resp 20

## 2016-05-08 DIAGNOSIS — D509 Iron deficiency anemia, unspecified: Secondary | ICD-10-CM | POA: Diagnosis not present

## 2016-05-08 DIAGNOSIS — N189 Chronic kidney disease, unspecified: Secondary | ICD-10-CM

## 2016-05-08 DIAGNOSIS — D508 Other iron deficiency anemias: Secondary | ICD-10-CM

## 2016-05-08 DIAGNOSIS — N183 Chronic kidney disease, stage 3 unspecified: Secondary | ICD-10-CM

## 2016-05-08 DIAGNOSIS — D631 Anemia in chronic kidney disease: Secondary | ICD-10-CM

## 2016-05-08 MED ORDER — FERUMOXYTOL INJECTION 510 MG/17 ML
510.0000 mg | Freq: Once | INTRAVENOUS | Status: AC
  Administered 2016-05-08: 510 mg via INTRAVENOUS
  Filled 2016-05-08: qty 17

## 2016-05-08 MED ORDER — SODIUM CHLORIDE 0.9 % IV SOLN
Freq: Once | INTRAVENOUS | Status: AC
  Administered 2016-05-08: 10:00:00 via INTRAVENOUS

## 2016-05-08 NOTE — Patient Instructions (Signed)
Ferumoxytol injection What is this medicine? FERUMOXYTOL is an iron complex. Iron is used to make healthy red blood cells, which carry oxygen and nutrients throughout the body. This medicine is used to treat iron deficiency anemia in people with chronic kidney disease. COMMON BRAND NAME(S): Feraheme What should I tell my health care provider before I take this medicine? They need to know if you have any of these conditions: -anemia not caused by low iron levels -high levels of iron in the blood -magnetic resonance imaging (MRI) test scheduled -an unusual or allergic reaction to iron, other medicines, foods, dyes, or preservatives -pregnant or trying to get pregnant -breast-feeding How should I use this medicine? This medicine is for injection into a vein. It is given by a health care professional in a hospital or clinic setting. Talk to your pediatrician regarding the use of this medicine in children. Special care may be needed. What if I miss a dose? It is important not to miss your dose. Call your doctor or health care professional if you are unable to keep an appointment. What may interact with this medicine? This medicine may interact with the following medications: -other iron products What should I watch for while using this medicine? Visit your doctor or healthcare professional regularly. Tell your doctor or healthcare professional if your symptoms do not start to get better or if they get worse. You may need blood work done while you are taking this medicine. You may need to follow a special diet. Talk to your doctor. Foods that contain iron include: whole grains/cereals, dried fruits, beans, or peas, leafy green vegetables, and organ meats (liver, kidney). What side effects may I notice from receiving this medicine? Side effects that you should report to your doctor or health care professional as soon as possible: -allergic reactions like skin rash, itching or hives, swelling of the  face, lips, or tongue -breathing problems -changes in blood pressure -feeling faint or lightheaded, falls -fever or chills -flushing, sweating, or hot feelings -swelling of the ankles or feet Side effects that usually do not require medical attention (report to your doctor or health care professional if they continue or are bothersome): -diarrhea -headache -nausea, vomiting -stomach pain Where should I keep my medicine? This drug is given in a hospital or clinic and will not be stored at home.  2017 Elsevier/Gold Standard (2015-05-13 12:41:49)  

## 2016-05-09 DIAGNOSIS — D631 Anemia in chronic kidney disease: Secondary | ICD-10-CM | POA: Diagnosis not present

## 2016-05-09 DIAGNOSIS — N183 Chronic kidney disease, stage 3 (moderate): Secondary | ICD-10-CM | POA: Diagnosis not present

## 2016-05-09 DIAGNOSIS — I5022 Chronic systolic (congestive) heart failure: Secondary | ICD-10-CM | POA: Diagnosis not present

## 2016-05-09 DIAGNOSIS — I2510 Atherosclerotic heart disease of native coronary artery without angina pectoris: Secondary | ICD-10-CM | POA: Diagnosis not present

## 2016-05-09 DIAGNOSIS — I130 Hypertensive heart and chronic kidney disease with heart failure and stage 1 through stage 4 chronic kidney disease, or unspecified chronic kidney disease: Secondary | ICD-10-CM | POA: Diagnosis not present

## 2016-05-09 DIAGNOSIS — L89152 Pressure ulcer of sacral region, stage 2: Secondary | ICD-10-CM | POA: Diagnosis not present

## 2016-05-11 DIAGNOSIS — I5022 Chronic systolic (congestive) heart failure: Secondary | ICD-10-CM | POA: Diagnosis not present

## 2016-05-11 DIAGNOSIS — I130 Hypertensive heart and chronic kidney disease with heart failure and stage 1 through stage 4 chronic kidney disease, or unspecified chronic kidney disease: Secondary | ICD-10-CM | POA: Diagnosis not present

## 2016-05-11 DIAGNOSIS — L89152 Pressure ulcer of sacral region, stage 2: Secondary | ICD-10-CM | POA: Diagnosis not present

## 2016-05-11 DIAGNOSIS — D631 Anemia in chronic kidney disease: Secondary | ICD-10-CM | POA: Diagnosis not present

## 2016-05-11 DIAGNOSIS — N183 Chronic kidney disease, stage 3 (moderate): Secondary | ICD-10-CM | POA: Diagnosis not present

## 2016-05-11 DIAGNOSIS — I2510 Atherosclerotic heart disease of native coronary artery without angina pectoris: Secondary | ICD-10-CM | POA: Diagnosis not present

## 2016-05-15 ENCOUNTER — Ambulatory Visit (HOSPITAL_BASED_OUTPATIENT_CLINIC_OR_DEPARTMENT_OTHER): Payer: Medicare Other

## 2016-05-15 VITALS — BP 148/48 | HR 48 | Temp 96.9°F | Resp 18

## 2016-05-15 DIAGNOSIS — D509 Iron deficiency anemia, unspecified: Secondary | ICD-10-CM

## 2016-05-15 DIAGNOSIS — N183 Chronic kidney disease, stage 3 unspecified: Secondary | ICD-10-CM

## 2016-05-15 DIAGNOSIS — D508 Other iron deficiency anemias: Secondary | ICD-10-CM

## 2016-05-15 DIAGNOSIS — N189 Chronic kidney disease, unspecified: Secondary | ICD-10-CM

## 2016-05-15 DIAGNOSIS — D631 Anemia in chronic kidney disease: Secondary | ICD-10-CM

## 2016-05-15 MED ORDER — SODIUM CHLORIDE 0.9 % IV SOLN
Freq: Once | INTRAVENOUS | Status: AC
  Administered 2016-05-15: 09:00:00 via INTRAVENOUS

## 2016-05-15 MED ORDER — SODIUM CHLORIDE 0.9 % IV SOLN
510.0000 mg | Freq: Once | INTRAVENOUS | Status: AC
  Administered 2016-05-15: 510 mg via INTRAVENOUS
  Filled 2016-05-15: qty 17

## 2016-05-15 NOTE — Patient Instructions (Signed)
Anemia, Nonspecific Anemia is a condition in which the concentration of red blood cells or hemoglobin in the blood is below normal. Hemoglobin is a substance in red blood cells that carries oxygen to the tissues of the body. Anemia results in not enough oxygen reaching these tissues. What are the causes? Common causes of anemia include:  Excessive bleeding. Bleeding may be internal or external. This includes excessive bleeding from periods (in women) or from the intestine.  Poor nutrition.  Chronic kidney, thyroid, and liver disease.  Bone marrow disorders that decrease red blood cell production.  Cancer and treatments for cancer.  HIV, AIDS, and their treatments.  Spleen problems that increase red blood cell destruction.  Blood disorders.  Excess destruction of red blood cells due to infection, medicines, and autoimmune disorders. What are the signs or symptoms?  Minor weakness.  Dizziness.  Headache.  Palpitations.  Shortness of breath, especially with exercise.  Paleness.  Cold sensitivity.  Indigestion.  Nausea.  Difficulty sleeping.  Difficulty concentrating. Symptoms may occur suddenly or they may develop slowly. How is this diagnosed? Additional blood tests are often needed. These help your health care provider determine the best treatment. Your health care provider will check your stool for blood and look for other causes of blood loss. How is this treated? Treatment varies depending on the cause of the anemia. Treatment can include:  Supplements of iron, vitamin B12, or folic acid.  Hormone medicines.  A blood transfusion. This may be needed if blood loss is severe.  Hospitalization. This may be needed if there is significant continual blood loss.  Dietary changes.  Spleen removal. Follow these instructions at home: Keep all follow-up appointments. It often takes many weeks to correct anemia, and having your health care provider check on your  condition and your response to treatment is very important. Get help right away if:  You develop extreme weakness, shortness of breath, or chest pain.  You become dizzy or have trouble concentrating.  You develop heavy vaginal bleeding.  You develop a rash.  You have bloody or black, tarry stools.  You faint.  You vomit up blood.  You vomit repeatedly.  You have abdominal pain.  You have a fever or persistent symptoms for more than 2-3 days.  You have a fever and your symptoms suddenly get worse.  You are dehydrated. This information is not intended to replace advice given to you by your health care provider. Make sure you discuss any questions you have with your health care provider. Document Released: 05/18/2004 Document Revised: 09/22/2015 Document Reviewed: 10/04/2012 Elsevier Interactive Patient Education  2017 Elsevier Inc.  

## 2016-05-24 DIAGNOSIS — N183 Chronic kidney disease, stage 3 (moderate): Secondary | ICD-10-CM | POA: Diagnosis not present

## 2016-05-24 DIAGNOSIS — I10 Essential (primary) hypertension: Secondary | ICD-10-CM | POA: Diagnosis not present

## 2016-05-24 DIAGNOSIS — E875 Hyperkalemia: Secondary | ICD-10-CM | POA: Diagnosis not present

## 2016-05-30 ENCOUNTER — Other Ambulatory Visit: Payer: Self-pay | Admitting: Cardiovascular Disease

## 2016-06-02 ENCOUNTER — Other Ambulatory Visit: Payer: Self-pay | Admitting: Cardiovascular Disease

## 2016-06-02 DIAGNOSIS — I6523 Occlusion and stenosis of bilateral carotid arteries: Secondary | ICD-10-CM

## 2016-06-06 ENCOUNTER — Inpatient Hospital Stay (HOSPITAL_COMMUNITY): Admission: RE | Admit: 2016-06-06 | Payer: Medicare Other | Source: Ambulatory Visit

## 2016-06-15 ENCOUNTER — Telehealth: Payer: Self-pay | Admitting: Hematology and Oncology

## 2016-06-15 ENCOUNTER — Ambulatory Visit (HOSPITAL_BASED_OUTPATIENT_CLINIC_OR_DEPARTMENT_OTHER): Payer: Medicare Other | Admitting: Hematology and Oncology

## 2016-06-15 ENCOUNTER — Ambulatory Visit (HOSPITAL_BASED_OUTPATIENT_CLINIC_OR_DEPARTMENT_OTHER): Payer: Medicare Other

## 2016-06-15 ENCOUNTER — Encounter: Payer: Self-pay | Admitting: Hematology and Oncology

## 2016-06-15 ENCOUNTER — Other Ambulatory Visit (HOSPITAL_BASED_OUTPATIENT_CLINIC_OR_DEPARTMENT_OTHER): Payer: Medicare Other

## 2016-06-15 VITALS — BP 84/37 | HR 59 | Temp 98.2°F | Resp 17 | Ht 70.0 in | Wt 158.8 lb

## 2016-06-15 DIAGNOSIS — N189 Chronic kidney disease, unspecified: Secondary | ICD-10-CM

## 2016-06-15 DIAGNOSIS — D61818 Other pancytopenia: Secondary | ICD-10-CM

## 2016-06-15 DIAGNOSIS — D508 Other iron deficiency anemias: Secondary | ICD-10-CM

## 2016-06-15 DIAGNOSIS — L03116 Cellulitis of left lower limb: Secondary | ICD-10-CM | POA: Diagnosis not present

## 2016-06-15 DIAGNOSIS — E538 Deficiency of other specified B group vitamins: Secondary | ICD-10-CM

## 2016-06-15 DIAGNOSIS — N183 Chronic kidney disease, stage 3 unspecified: Secondary | ICD-10-CM

## 2016-06-15 DIAGNOSIS — I5042 Chronic combined systolic (congestive) and diastolic (congestive) heart failure: Secondary | ICD-10-CM | POA: Diagnosis not present

## 2016-06-15 DIAGNOSIS — D631 Anemia in chronic kidney disease: Secondary | ICD-10-CM

## 2016-06-15 LAB — CBC & DIFF AND RETIC
BASO%: 1 % (ref 0.0–2.0)
BASOS ABS: 0 10*3/uL (ref 0.0–0.1)
EOS%: 5.2 % (ref 0.0–7.0)
Eosinophils Absolute: 0.2 10*3/uL (ref 0.0–0.5)
HEMATOCRIT: 31.5 % — AB (ref 38.4–49.9)
HEMOGLOBIN: 9.8 g/dL — AB (ref 13.0–17.1)
IMMATURE RETIC FRACT: 3.6 % (ref 3.00–10.60)
LYMPH%: 23.8 % (ref 14.0–49.0)
MCH: 29.7 pg (ref 27.2–33.4)
MCHC: 31.1 g/dL — ABNORMAL LOW (ref 32.0–36.0)
MCV: 95.5 fL (ref 79.3–98.0)
MONO#: 0.4 10*3/uL (ref 0.1–0.9)
MONO%: 12.7 % (ref 0.0–14.0)
NEUT#: 1.8 10*3/uL (ref 1.5–6.5)
NEUT%: 57.3 % (ref 39.0–75.0)
PLATELETS: 82 10*3/uL — AB (ref 140–400)
RBC: 3.3 10*6/uL — AB (ref 4.20–5.82)
RDW: 15.3 % — ABNORMAL HIGH (ref 11.0–14.6)
RETIC CT ABS: 79.86 10*3/uL (ref 34.80–93.90)
Retic %: 2.42 % — ABNORMAL HIGH (ref 0.80–1.80)
WBC: 3.1 10*3/uL — ABNORMAL LOW (ref 4.0–10.3)
lymph#: 0.7 10*3/uL — ABNORMAL LOW (ref 0.9–3.3)

## 2016-06-15 LAB — IRON AND TIBC
%SAT: 20 % (ref 20–55)
Iron: 49 ug/dL (ref 42–163)
TIBC: 245 ug/dL (ref 202–409)
UIBC: 196 ug/dL (ref 117–376)

## 2016-06-15 LAB — FERRITIN: Ferritin: 273 ng/ml (ref 22–316)

## 2016-06-15 MED ORDER — DARBEPOETIN ALFA 200 MCG/0.4ML IJ SOSY
200.0000 ug | PREFILLED_SYRINGE | Freq: Once | INTRAMUSCULAR | Status: AC
  Administered 2016-06-15: 200 ug via SUBCUTANEOUS
  Filled 2016-06-15: qty 0.4

## 2016-06-15 MED ORDER — CEPHALEXIN 500 MG PO CAPS: 500.0000 mg | ORAL_CAPSULE | Freq: Two times a day (BID) | ORAL | 0 refills | Status: DC

## 2016-06-15 NOTE — Telephone Encounter (Signed)
Appointments scheduled per 2/22 LOS. Patient given AVS report and calendars with future scheduled appointments. °

## 2016-06-15 NOTE — Assessment & Plan Note (Addendum)
His bone marrow biopsy showed no signs of myelodysplastic syndrome or lymphoma. CT scan of the abdomen showed no evidence of liver cirrhosis or splenomegaly. Overall, I think the mild leukopenia and thrombocytopenia could be due to very early signs of myelodysplastic syndrome but he is not symptomatic and would not require treatment for that. He has mild worsening pancytopenia now, likely due to cellulitis. He does not need further treatment for leukopenia. The cause of thrombocytopenia could be related to his underlying medical condition. It is mild and there is little change compared from previous platelet count. The patient denies recent history of bleeding such as epistaxis, hematuria or hematochezia. He is asymptomatic from the thrombocytopenia. I will observe for now.  he does not require further evaluation or treatment for this

## 2016-06-15 NOTE — Assessment & Plan Note (Signed)
The patient has chronic congestive heart failure. His blood pressure is very low. Clinically, he appears dehydrated. His recent renal function test performed at the nephrologist office was adequate. I recommend he contact his cardiologist for medication adjustment if his blood pressure remains low.

## 2016-06-15 NOTE — Patient Instructions (Signed)

## 2016-06-15 NOTE — Assessment & Plan Note (Signed)
He has clinical signs of cellulitis, and resolve over the past 3 weeks. Due to his diagnosis of peripheral vascular disease, he would need treatment. I would put him on prescription Keflex for 10 days. I draw an outline of the area of cellulitis.  If the area started to spread or if he sees no clinical improvement over the next 3 days, I recommend he call primary doctor for further evaluation and management

## 2016-06-15 NOTE — Assessment & Plan Note (Signed)
The patient has multifactorial anemia, likely combination of iron deficiency anemia and anemia of chronic renal failure. He has recent adequate intravenous iron replacement. We discussed the use of ESA We discussed some of the risks, benefits, and alternatives of erythropoietin stimulating agents such as Procrit or Aranesp. The patient is symptomatic from anemia and the EPO level is low. Some of the side-effects to be expected including risks of allergic reactions, skin rashes, headaches, risk of blood clots including heart attack and stroke. There is rare risks of causing growth of cancers.The patient is willing to proceed and went ahead to sign consent today. My goal would be to keep hemoglobin greater than 10.  He will come here every 4 weeks and I will assess response to treatment in 4 months

## 2016-06-15 NOTE — Progress Notes (Signed)
[place], MD SUMMARY OF HEMATOLOGIC HISTORY: [name] 66 y.o. male was seen on 11/01/15 because of pancytopenia. The patient is here today accompanied by his brother. The patient has significant major comorbidities including history of stroke causing persistent left-sided weakness, chronic respiratory failure secondary to COPD on chronic oxygen therapy for the past 4 years, chronic atrial fibrillation and significant peripheral vascular disease on chronic anticoagulation therapy and chronic kidney disease. I had the opportunity to review his blood work from 2010. At the time, he has normal CBC. Starting around the end of 2014, he started to develop progressive anemia. Subsequently, starting around May 2016, he started to have progressive thrombocytopenia. According to scanned medical records, he had multiple blood tests done at close interval since September 2016. WBC has reduced from the 3 range down to 2.8 most recently on 10/11/2015. His hemoglobin has drifted down to 8.5 with high MCV and platelet count has drifted down to 85,000 Serum folate and vitamin B-12 were within normal limits. He had iron studies done with serum ferritin at 60. The patient has been placed on iron supplement for some time without significant benefit. On 11/03/2015, he had bone marrow aspirate and biopsy which excluded myelodysplastic syndrome On 12/15/15, CT chest, abdomen and pelvis showed no CT findings to suggest cirrhosis.  He received intravenous iron in September 2017 and January 2018 further follow-up.  INTERVAL HISTORY: [name] 66 y.o. male returns for chest pain or shortness of breath. The patient denies any recent signs or symptoms of bleeding such as spontaneous epistaxis, hematuria or hematochezia. He complained of new onset of rash on both legs, worse on the left leg for the last 3 weeks.  He denies fever or chills. He saw his  nephrologist recently.  On review of records, his serum creatinine was good, at 1.6. His appetite is stable, no recent weight loss.  No recent bone pain.  I have reviewed the past medical history, past surgical history, social history and family history with the patient and they are unchanged from previous note.  ALLERGIES:  has No Known Allergies.  MEDICATIONS:  Current Outpatient Prescriptions  Medication Sig Dispense Refill  . albuterol (PROVENTIL HFA;VENTOLIN HFA) 108 (90 BASE) MCG/ACT inhaler Inhale 2 puffs into the lungs every 6 (six) hours as needed for wheezing or shortness of breath. 1 Inhaler 2  . amiodarone (PACERONE) 200 MG tablet Take 0.5 tablets (100 mg total) by mouth daily. 15 tablet 3  . apixaban (ELIQUIS) 5 MG TABS tablet Take 1 tablet (5 mg total) by mouth 2 (two) times daily. 180 tablet 3  . atorvastatin (LIPITOR) 80 MG tablet TAKE 1 TABLET EVERY DAY AT 6PM 30 tablet 2  . carvedilol (COREG) 25 MG tablet Take 0.5 tablets (12.5 mg total) by mouth 2 (two) times daily. 30 tablet 8  . Fe Fum-FePoly-Vit C-Vit B3 (INTEGRA) 62.5-62.5-40-3 MG CAPS Take 1 capsule by mouth daily.   4  . folic acid (FOLVITE) 1 MG tablet Take 1 tablet (1 mg total) by mouth daily. 30 tablet 6  . OXYGEN-HELIUM IN Inhale 1-2 L into the lungs continuous. Is in the habit of not using his oxygen when he leaves the house. Uses 1L continuous normally Uses 2L for exertion    . pantoprazole (PROTONIX) 40 MG tablet Take 40 mg by mouth daily.    Marland Kitchen tiZANidine (ZANAFLEX) 4 MG tablet Take 1 tablet by mouth 2 (two) times daily.  5  .  torsemide (DEMADEX) 20 MG tablet     . traMADol (ULTRAM) 50 MG tablet Take 1 tablet (50 mg total) by mouth 4 (four) times daily. 120 tablet 0  . cephALEXin (KEFLEX) 500 MG capsule Take 1 capsule (500 mg total) by mouth 2 (two) times daily. 20 capsule 0  . diclofenac sodium (VOLTAREN) 1 % GEL Apply 4 g topically 4 (four) times daily.     No current facility-administered medications for  this visit.      REVIEW OF SYSTEMS:   Constitutional: Denies fevers, chills or night sweats Eyes: Denies blurriness of vision Ears, nose, mouth, throat, and face: Denies mucositis or sore throat Respiratory: Denies cough, dyspnea or wheezes Cardiovascular: Denies palpitation, chest discomfort or lower extremity swelling Gastrointestinal:  Denies nausea, heartburn or change in bowel habits Lymphatics: Denies new lymphadenopathy or easy bruising Neurological:Denies numbness, tingling or new weaknesses Behavioral/Psych: Mood is stable, no new changes  All other systems were reviewed with the patient and are negative.  PHYSICAL EXAMINATION: ECOG PERFORMANCE STATUS: 2 - Symptomatic, <50% confined to bed  Vitals:   06/15/16 1026  BP: (!) 84/37  Pulse: (!) 59  Resp: 17  Temp: 98.2 F (36.8 C)   Filed Weights   06/15/16 1026  Weight: 158 lb 12.8 oz (72 kg)    GENERAL:alert, no distress and comfortable SKIN: He has chronic venous stasis changes on both legs.  On the left leg, there is an area of significant redness, warmth and mild swelling in the anterior shin area, consistent with cellulitis EYES: normal, Conjunctiva are pink and non-injected, sclera clear OROPHARYNX:no exudate, no erythema and lips, buccal mucosa, and tongue normal  NECK: supple, thyroid normal size, non-tender, without nodularity LYMPH:  no palpable lymphadenopathy in the cervical, axillary or inguinal LUNGS: clear to auscultation and percussion with normal breathing effort HEART: regular rate & rhythm and no murmurs and no lower extremity edema ABDOMEN:abdomen soft, non-tender and normal bowel sounds Musculoskeletal:no cyanosis of digits and no clubbing  NEURO: alert & oriented x 3 with fluent speech, no focal motor/sensory deficits  LABORATORY DATA:  I have reviewed the data as listed     Component Value Date/Time   NA 137 03/23/2016 1018   NA 140 12/15/2015 0855   K 3.4 (L) 03/23/2016 1018   K 5.5 No  visable hemolysis (H) 01/17/2016 1036   CL 100 (L) 03/23/2016 1018   CO2 29 03/23/2016 1018   CO2 24 12/15/2015 0855   GLUCOSE 108 (H) 03/23/2016 1018   GLUCOSE 82 12/15/2015 0855   BUN 35 (H) 03/23/2016 1018   BUN 28.5 (H) 12/15/2015 0855   CREATININE 1.95 (H) 03/23/2016 1018   CREATININE 1.6 (H) 12/15/2015 0855   CALCIUM 8.2 (L) 03/23/2016 1018   CALCIUM 8.9 12/15/2015 0855   PROT 6.9 12/15/2015 0855   ALBUMIN 2.9 (L) 12/15/2015 0855   AST 56 (H) 12/15/2015 0855   ALT 33 12/15/2015 0855   ALKPHOS 82 12/15/2015 0855   BILITOT 0.38 12/15/2015 0855   GFRNONAA 34 (L) 03/23/2016 1018   GFRNONAA 57 (L) 10/23/2013 1007   GFRAA 39 (L) 03/23/2016 1018   GFRAA 66 10/23/2013 1007    No results found for: SPEP, UPEP  Lab Results  Component Value Date   WBC 3.1 (L) 06/15/2016   NEUTROABS 1.8 06/15/2016   HGB 9.8 (L) 06/15/2016   HCT 31.5 (L) 06/15/2016   MCV 95.5 06/15/2016   PLT 82 (L) 06/15/2016      Chemistry  Component Value Date/Time   NA 137 03/23/2016 1018   NA 140 12/15/2015 0855   K 3.4 (L) 03/23/2016 1018   K 5.5 No visable hemolysis (H) 01/17/2016 1036   CL 100 (L) 03/23/2016 1018   CO2 29 03/23/2016 1018   CO2 24 12/15/2015 0855   BUN 35 (H) 03/23/2016 1018   BUN 28.5 (H) 12/15/2015 0855   CREATININE 1.95 (H) 03/23/2016 1018   CREATININE 1.6 (H) 12/15/2015 0855      Component Value Date/Time   CALCIUM 8.2 (L) 03/23/2016 1018   CALCIUM 8.9 12/15/2015 0855   ALKPHOS 82 12/15/2015 0855   AST 56 (H) 12/15/2015 0855   ALT 33 12/15/2015 0855   BILITOT 0.38 12/15/2015 0855         ASSESSMENT & PLAN:  Pancytopenia, acquired ([place]) His bone marrow biopsy showed no signs of myelodysplastic syndrome or lymphoma. CT scan of the abdomen showed no evidence of liver cirrhosis or splenomegaly. Overall, I think the mild leukopenia and thrombocytopenia could be due to very early signs of myelodysplastic syndrome but he is not symptomatic and would not require  treatment for that. He has mild worsening pancytopenia now, likely due to cellulitis. He does not need further treatment for leukopenia. The cause of thrombocytopenia could be related to his underlying medical condition. It is mild and there is little change compared from previous platelet count. The patient denies recent history of bleeding such as epistaxis, hematuria or hematochezia. He is asymptomatic from the thrombocytopenia. I will observe for now.  he does not require further evaluation or treatment for this   Cellulitis of left lower leg He has clinical signs of cellulitis, and resolve over the past 3 weeks. Due to his diagnosis of peripheral vascular disease, he would need treatment. I would put him on prescription Keflex for 10 days. I draw an outline of the area of cellulitis.  If the area started to spread or if he sees no clinical improvement over the next 3 days, I recommend he call primary doctor for further evaluation and management  Anemia due to chronic renal failure treated with erythropoietin The patient has multifactorial anemia, likely combination of iron deficiency anemia and anemia of chronic renal failure. He has recent adequate intravenous iron replacement. We discussed the use of ESA We discussed some of the risks, benefits, and alternatives of erythropoietin stimulating agents such as Procrit or Aranesp. The patient is symptomatic from anemia and the EPO level is low. Some of the side-effects to be expected including risks of allergic reactions, skin rashes, headaches, risk of blood clots including heart attack and stroke. There is rare risks of causing growth of cancers.The patient is willing to proceed and went ahead to sign consent today. My goal would be to keep hemoglobin greater than 10.  He will come here every 4 weeks and I will assess response to treatment in 4 months   Chronic combined systolic and diastolic heart failure ([place]) The patient has chronic  congestive heart failure. His blood pressure is very low. Clinically, he appears dehydrated. His recent renal function test performed at the nephrologist office was adequate. I recommend he contact his cardiologist for medication adjustment if his blood pressure remains low.   Orders Placed This Encounter  Procedures  . CBC with Differential/Platelet    Standing Status:   Standing    Number of Occurrences:   22    Standing Expiration Date:   06/15/2017    All questions were answered. The  patient knows to call the clinic with any problems, questions or concerns. No barriers to learning was detected.  I spent 30 minutes counseling the patient face to face. The total time spent in the appointment was 40 minutes and more than 50% was on counseling.     Heath Lark, MD 2/22/201811:57 AM

## 2016-06-16 LAB — ERYTHROPOIETIN: Erythropoietin: 11 m[IU]/mL (ref 2.6–18.5)

## 2016-06-16 LAB — VITAMIN B12: Vitamin B12: 689 pg/mL (ref 232–1245)

## 2016-07-05 ENCOUNTER — Other Ambulatory Visit (HOSPITAL_COMMUNITY): Payer: Self-pay | Admitting: Internal Medicine

## 2016-07-14 ENCOUNTER — Other Ambulatory Visit (HOSPITAL_BASED_OUTPATIENT_CLINIC_OR_DEPARTMENT_OTHER): Payer: Medicare Other

## 2016-07-14 ENCOUNTER — Ambulatory Visit: Payer: Medicare Other

## 2016-07-14 DIAGNOSIS — N183 Chronic kidney disease, stage 3 unspecified: Secondary | ICD-10-CM

## 2016-07-14 DIAGNOSIS — D61818 Other pancytopenia: Secondary | ICD-10-CM

## 2016-07-14 LAB — CBC WITH DIFFERENTIAL/PLATELET
BASO%: 1.7 % (ref 0.0–2.0)
Basophils Absolute: 0.1 10*3/uL (ref 0.0–0.1)
EOS ABS: 0.2 10*3/uL (ref 0.0–0.5)
EOS%: 5.6 % (ref 0.0–7.0)
HCT: 36.8 % — ABNORMAL LOW (ref 38.4–49.9)
HEMOGLOBIN: 11.8 g/dL — AB (ref 13.0–17.1)
LYMPH%: 26.5 % (ref 14.0–49.0)
MCH: 29.6 pg (ref 27.2–33.4)
MCHC: 32.2 g/dL (ref 32.0–36.0)
MCV: 92.2 fL (ref 79.3–98.0)
MONO#: 0.6 10*3/uL (ref 0.1–0.9)
MONO%: 13.6 % (ref 0.0–14.0)
NEUT#: 2.2 10*3/uL (ref 1.5–6.5)
NEUT%: 52.6 % (ref 39.0–75.0)
PLATELETS: 116 10*3/uL — AB (ref 140–400)
RBC: 3.99 10*6/uL — AB (ref 4.20–5.82)
RDW: 15.6 % — AB (ref 11.0–14.6)
WBC: 4.1 10*3/uL (ref 4.0–10.3)
lymph#: 1.1 10*3/uL (ref 0.9–3.3)

## 2016-07-14 NOTE — Progress Notes (Signed)
Hemoglobin noted at 11.8 today. No injection needed per order protocol. Copy of current labs and schedule given to patient and family member. Pt instructed to follow schedule and to call office if issues occur. Pt and family member verbalized  Understanding of instructions

## 2016-07-18 ENCOUNTER — Other Ambulatory Visit (HOSPITAL_COMMUNITY): Payer: Self-pay | Admitting: Pharmacist

## 2016-07-18 MED ORDER — APIXABAN 5 MG PO TABS: 5.0000 mg | ORAL_TABLET | Freq: Two times a day (BID) | ORAL | 3 refills | Status: DC

## 2016-07-24 ENCOUNTER — Other Ambulatory Visit (HOSPITAL_COMMUNITY): Payer: Self-pay | Admitting: Pharmacist

## 2016-07-24 ENCOUNTER — Telehealth (HOSPITAL_COMMUNITY): Payer: Self-pay | Admitting: Pharmacist

## 2016-07-24 MED ORDER — APIXABAN 5 MG PO TABS: 5.0000 mg | ORAL_TABLET | Freq: Two times a day (BID) | ORAL | 11 refills | Status: DC

## 2016-07-24 NOTE — Telephone Encounter (Addendum)
[employer] patient assistance application denied for Eliquis. Patient needs to have spent 3% of his annual income on Rx's for the year 2018 before he is approved ($267.31). I have relayed this information to his daughter, Judson Roch, who will send in 2018 pharmacy print out.   Ruta Hinds. Velva Harman, PharmD, BCPS, CPP Clinical Pharmacist Pager: [phone] Phone: [phone] 07/24/2016 2:49 PM

## 2016-08-11 ENCOUNTER — Ambulatory Visit: Payer: Medicare Other

## 2016-08-11 ENCOUNTER — Other Ambulatory Visit (HOSPITAL_BASED_OUTPATIENT_CLINIC_OR_DEPARTMENT_OTHER): Payer: Medicare Other

## 2016-08-11 DIAGNOSIS — D61818 Other pancytopenia: Secondary | ICD-10-CM | POA: Diagnosis not present

## 2016-08-11 DIAGNOSIS — N183 Chronic kidney disease, stage 3 unspecified: Secondary | ICD-10-CM

## 2016-08-11 LAB — CBC WITH DIFFERENTIAL/PLATELET
BASO%: 1.2 % (ref 0.0–2.0)
Basophils Absolute: 0.1 10*3/uL (ref 0.0–0.1)
EOS%: 6.9 % (ref 0.0–7.0)
Eosinophils Absolute: 0.3 10*3/uL (ref 0.0–0.5)
HCT: 33.5 % — ABNORMAL LOW (ref 38.4–49.9)
HEMOGLOBIN: 10.6 g/dL — AB (ref 13.0–17.1)
LYMPH%: 29 % (ref 14.0–49.0)
MCH: 29.7 pg (ref 27.2–33.4)
MCHC: 31.6 g/dL — ABNORMAL LOW (ref 32.0–36.0)
MCV: 93.8 fL (ref 79.3–98.0)
MONO#: 0.6 10*3/uL (ref 0.1–0.9)
MONO%: 14.1 % — AB (ref 0.0–14.0)
NEUT%: 48.8 % (ref 39.0–75.0)
NEUTROS ABS: 2 10*3/uL (ref 1.5–6.5)
Platelets: 98 10*3/uL — ABNORMAL LOW (ref 140–400)
RBC: 3.57 10*6/uL — AB (ref 4.20–5.82)
RDW: 14.5 % (ref 11.0–14.6)
WBC: 4 10*3/uL (ref 4.0–10.3)
lymph#: 1.2 10*3/uL (ref 0.9–3.3)

## 2016-08-11 NOTE — Progress Notes (Signed)
Hold Aranesp today; Hgb = 10.6; patient received copy of labs and discharged home.

## 2016-08-15 ENCOUNTER — Other Ambulatory Visit (HOSPITAL_COMMUNITY): Payer: Self-pay | Admitting: Cardiology

## 2016-08-15 MED ORDER — AMIODARONE HCL 200 MG PO TABS: 100.0000 mg | ORAL_TABLET | Freq: Every day | ORAL | 3 refills | Status: DC

## 2016-08-20 ENCOUNTER — Other Ambulatory Visit (HOSPITAL_COMMUNITY): Payer: Self-pay | Admitting: Adult Health

## 2016-08-24 DIAGNOSIS — E782 Mixed hyperlipidemia: Secondary | ICD-10-CM | POA: Diagnosis not present

## 2016-08-24 DIAGNOSIS — I2510 Atherosclerotic heart disease of native coronary artery without angina pectoris: Secondary | ICD-10-CM | POA: Diagnosis not present

## 2016-08-24 DIAGNOSIS — I5022 Chronic systolic (congestive) heart failure: Secondary | ICD-10-CM | POA: Diagnosis not present

## 2016-08-24 DIAGNOSIS — I638 Other cerebral infarction: Secondary | ICD-10-CM | POA: Diagnosis not present

## 2016-09-08 ENCOUNTER — Other Ambulatory Visit (HOSPITAL_BASED_OUTPATIENT_CLINIC_OR_DEPARTMENT_OTHER): Payer: Medicare Other

## 2016-09-08 ENCOUNTER — Ambulatory Visit: Payer: Medicare Other

## 2016-09-08 DIAGNOSIS — N183 Chronic kidney disease, stage 3 unspecified: Secondary | ICD-10-CM

## 2016-09-08 DIAGNOSIS — D61818 Other pancytopenia: Secondary | ICD-10-CM

## 2016-09-08 LAB — CBC WITH DIFFERENTIAL/PLATELET
BASO%: 0.6 % (ref 0.0–2.0)
Basophils Absolute: 0 10*3/uL (ref 0.0–0.1)
EOS%: 5.8 % (ref 0.0–7.0)
Eosinophils Absolute: 0.3 10*3/uL (ref 0.0–0.5)
HCT: 33.6 % — ABNORMAL LOW (ref 38.4–49.9)
HEMOGLOBIN: 10.6 g/dL — AB (ref 13.0–17.1)
LYMPH#: 1.4 10*3/uL (ref 0.9–3.3)
LYMPH%: 31 % (ref 14.0–49.0)
MCH: 30.2 pg (ref 27.2–33.4)
MCHC: 31.5 g/dL — ABNORMAL LOW (ref 32.0–36.0)
MCV: 95.7 fL (ref 79.3–98.0)
MONO#: 0.7 10*3/uL (ref 0.1–0.9)
MONO%: 15.4 % — AB (ref 0.0–14.0)
NEUT%: 47.2 % (ref 39.0–75.0)
NEUTROS ABS: 2.2 10*3/uL (ref 1.5–6.5)
Platelets: 105 10*3/uL — ABNORMAL LOW (ref 140–400)
RBC: 3.51 10*6/uL — ABNORMAL LOW (ref 4.20–5.82)
RDW: 14.6 % (ref 11.0–14.6)
WBC: 4.6 10*3/uL (ref 4.0–10.3)

## 2016-09-08 NOTE — Progress Notes (Signed)
Pt's Hemoglobin 10.6 today.   No injection needed per protocol order. Copy of labs and current schedule given. Pt instructed to follow schedule and call office if issues occur. Pt verbalized understanding of instructions

## 2016-10-05 ENCOUNTER — Ambulatory Visit: Payer: Medicare Other | Admitting: Neurology

## 2016-10-06 ENCOUNTER — Encounter (HOSPITAL_BASED_OUTPATIENT_CLINIC_OR_DEPARTMENT_OTHER): Payer: Medicare Other

## 2016-10-06 ENCOUNTER — Ambulatory Visit (HOSPITAL_BASED_OUTPATIENT_CLINIC_OR_DEPARTMENT_OTHER): Payer: Medicare Other | Admitting: Hematology and Oncology

## 2016-10-06 ENCOUNTER — Ambulatory Visit: Payer: Medicare Other

## 2016-10-06 ENCOUNTER — Ambulatory Visit (HOSPITAL_BASED_OUTPATIENT_CLINIC_OR_DEPARTMENT_OTHER): Payer: Medicare Other

## 2016-10-06 ENCOUNTER — Encounter: Payer: Self-pay | Admitting: Hematology and Oncology

## 2016-10-06 ENCOUNTER — Telehealth: Payer: Self-pay | Admitting: Hematology and Oncology

## 2016-10-06 VITALS — BP 117/43 | HR 44 | Temp 98.6°F | Resp 17 | Ht 70.0 in | Wt 168.2 lb

## 2016-10-06 DIAGNOSIS — D508 Other iron deficiency anemias: Secondary | ICD-10-CM

## 2016-10-06 DIAGNOSIS — D631 Anemia in chronic kidney disease: Secondary | ICD-10-CM | POA: Diagnosis not present

## 2016-10-06 DIAGNOSIS — N183 Chronic kidney disease, stage 3 unspecified: Secondary | ICD-10-CM

## 2016-10-06 DIAGNOSIS — D61818 Other pancytopenia: Secondary | ICD-10-CM

## 2016-10-06 DIAGNOSIS — N189 Chronic kidney disease, unspecified: Secondary | ICD-10-CM

## 2016-10-06 LAB — CBC WITH DIFFERENTIAL/PLATELET
BASO%: 1.8 % (ref 0.0–2.0)
BASOS ABS: 0.1 10*3/uL (ref 0.0–0.1)
EOS ABS: 0.3 10*3/uL (ref 0.0–0.5)
EOS%: 6.8 % (ref 0.0–7.0)
HCT: 30.2 % — ABNORMAL LOW (ref 38.4–49.9)
HEMOGLOBIN: 9.9 g/dL — AB (ref 13.0–17.1)
LYMPH%: 29.5 % (ref 14.0–49.0)
MCH: 30.4 pg (ref 27.2–33.4)
MCHC: 32.8 g/dL (ref 32.0–36.0)
MCV: 92.9 fL (ref 79.3–98.0)
MONO#: 0.5 10*3/uL (ref 0.1–0.9)
MONO%: 12.6 % (ref 0.0–14.0)
NEUT#: 2.1 10*3/uL (ref 1.5–6.5)
NEUT%: 49.3 % (ref 39.0–75.0)
PLATELETS: 120 10*3/uL — AB (ref 140–400)
RBC: 3.25 10*6/uL — ABNORMAL LOW (ref 4.20–5.82)
RDW: 15 % — AB (ref 11.0–14.6)
WBC: 4.2 10*3/uL (ref 4.0–10.3)
lymph#: 1.2 10*3/uL (ref 0.9–3.3)

## 2016-10-06 LAB — FERRITIN: Ferritin: 132 ng/ml (ref 22–316)

## 2016-10-06 LAB — IRON AND TIBC
%SAT: 22 % (ref 20–55)
Iron: 69 ug/dL (ref 42–163)
TIBC: 310 ug/dL (ref 202–409)
UIBC: 241 ug/dL (ref 117–376)

## 2016-10-06 MED ORDER — DARBEPOETIN ALFA 200 MCG/0.4ML IJ SOSY
200.0000 ug | PREFILLED_SYRINGE | Freq: Once | INTRAMUSCULAR | Status: AC
  Administered 2016-10-06: 200 ug via SUBCUTANEOUS
  Filled 2016-10-06: qty 0.4

## 2016-10-06 NOTE — Patient Instructions (Signed)

## 2016-10-06 NOTE — Progress Notes (Signed)
[facility] OFFICE PROGRESS NOTE  Merrilee Seashore, MD SUMMARY OF HEMATOLOGIC HISTORY:  [name] 66 y.o. male was seen on 11/01/15 because of pancytopenia. The patient is here today accompanied by his brother. The patient has significant major comorbidities including history of stroke causing persistent left-sided weakness, chronic respiratory failure secondary to COPD on chronic oxygen therapy for the past 4 years, chronic atrial fibrillation and significant peripheral vascular disease on chronic anticoagulation therapy and chronic kidney disease. I had the opportunity to review his blood work from 2010. At the time, he has normal CBC. Starting around the end of 2014, he started to develop progressive anemia. Subsequently, starting around May 2016, he started to have progressive thrombocytopenia. According to scanned medical records, he had multiple blood tests done at close interval since September 2016. WBC has reduced from the 3 range down to 2.8 most recently on 10/11/2015. His hemoglobin has drifted down to 8.5 with high MCV and platelet count has drifted down to 85,000 Serum folate and vitamin B-12 were within normal limits. He had iron studies done with serum ferritin at 60. The patient has been placed on iron supplement for some time without significant benefit. On 11/03/2015, he had bone marrow aspirate and biopsy which excluded myelodysplastic syndrome On 12/15/15, CT chest, abdomen and pelvis showed no CT findings to suggest cirrhosis.  He received intravenous iron in September 2017 and January 2018 further follow-up.  INTERVAL HISTORY: SHADY BRADISH 66 y.o. male returns for further follow-up. He feels well Denies dizziness, chest pain or shortness of breath No recurrence of cellulitis The patient denies any recent signs or symptoms of bleeding such as spontaneous epistaxis, hematuria or hematochezia.   I have reviewed the past medical history, past  surgical history, social history and family history with the patient and they are unchanged from previous note.  ALLERGIES:  has No Known Allergies.  MEDICATIONS:  Current Outpatient Prescriptions  Medication Sig Dispense Refill  . amiodarone (PACERONE) 200 MG tablet Take 0.5 tablets (100 mg total) by mouth daily. 45 tablet 3  . apixaban (ELIQUIS) 5 MG TABS tablet Take 1 tablet (5 mg total) by mouth 2 (two) times daily. 60 tablet 11  . atorvastatin (LIPITOR) 80 MG tablet TAKE 1 TABLET EVERY DAY AT 6PM 30 tablet 11  . carvedilol (COREG) 25 MG tablet Take 0.5 tablets (12.5 mg total) by mouth 2 (two) times daily. 30 tablet 8  . diclofenac sodium (VOLTAREN) 1 % GEL Apply 4 g topically 4 (four) times daily.    . Fe Fum-FePoly-Vit C-Vit B3 (INTEGRA) 62.5-62.5-40-3 MG CAPS Take 1 capsule by mouth daily.   4  . folic acid (FOLVITE) 1 MG tablet Take 1 tablet (1 mg total) by mouth daily. 30 tablet 6  . OXYGEN-HELIUM IN Inhale 1-2 L into the lungs continuous. Is in the habit of not using his oxygen when he leaves the house. Uses 1L continuous normally Uses 2L for exertion    . tiZANidine (ZANAFLEX) 4 MG tablet Take 1 tablet by mouth 2 (two) times daily.  5  . traMADol (ULTRAM) 50 MG tablet Take 1 tablet (50 mg total) by mouth 4 (four) times daily. 120 tablet 0  . albuterol (PROVENTIL HFA;VENTOLIN HFA) 108 (90 BASE) MCG/ACT inhaler Inhale 2 puffs into the lungs every 6 (six) hours as needed for wheezing or shortness of breath. (Patient not taking: Reported on 10/06/2016) 1 Inhaler 2  . torsemide (DEMADEX) 20 MG tablet  No current facility-administered medications for this visit.      REVIEW OF SYSTEMS:   Constitutional: Denies fevers, chills or night sweats Eyes: Denies blurriness of vision Ears, nose, mouth, throat, and face: Denies mucositis or sore throat Respiratory: Denies cough, dyspnea or wheezes Cardiovascular: Denies palpitation, chest discomfort or lower extremity  swelling Gastrointestinal:  Denies nausea, heartburn or change in bowel habits Skin: Denies abnormal skin rashes Lymphatics: Denies new lymphadenopathy or easy bruising Neurological:Denies numbness, tingling or new weaknesses Behavioral/Psych: Mood is stable, no new changes  All other systems were reviewed with the patient and are negative.  PHYSICAL EXAMINATION: ECOG PERFORMANCE STATUS: 0 - Asymptomatic  Vitals:   10/06/16 0959 10/06/16 1005  BP: (!) 139/37 (!) 117/43  Pulse: (!) 44   Resp: 17   Temp: 98.6 F (37 C)    Filed Weights   10/06/16 0959  Weight: 168 lb 3.2 oz (76.3 kg)    GENERAL:alert, no distress and comfortable SKIN: noted skin discoloration consistent with chronic venous dermatitis EYES: normal, Conjunctiva are pink and non-injected, sclera clear Musculoskeletal:no cyanosis of digits and no clubbing  NEURO: alert & oriented x 3 with fluent speech, no focal motor/sensory deficits  LABORATORY DATA:  I have reviewed the data as listed     Component Value Date/Time   NA 137 03/23/2016 1018   NA 140 12/15/2015 0855   K 3.4 (L) 03/23/2016 1018   K 5.5 No visable hemolysis (H) 01/17/2016 1036   CL 100 (L) 03/23/2016 1018   CO2 29 03/23/2016 1018   CO2 24 12/15/2015 0855   GLUCOSE 108 (H) 03/23/2016 1018   GLUCOSE 82 12/15/2015 0855   BUN 35 (H) 03/23/2016 1018   BUN 28.5 (H) 12/15/2015 0855   CREATININE 1.95 (H) 03/23/2016 1018   CREATININE 1.6 (H) 12/15/2015 0855   CALCIUM 8.2 (L) 03/23/2016 1018   CALCIUM 8.9 12/15/2015 0855   PROT 6.9 12/15/2015 0855   ALBUMIN 2.9 (L) 12/15/2015 0855   AST 56 (H) 12/15/2015 0855   ALT 33 12/15/2015 0855   ALKPHOS 82 12/15/2015 0855   BILITOT 0.38 12/15/2015 0855   GFRNONAA 34 (L) 03/23/2016 1018   GFRNONAA 57 (L) 10/23/2013 1007   GFRAA 39 (L) 03/23/2016 1018   GFRAA 66 10/23/2013 1007    No results found for: SPEP, UPEP  Lab Results  Component Value Date   WBC 4.2 10/06/2016   NEUTROABS 2.1 10/06/2016    HGB 9.9 (L) 10/06/2016   HCT 30.2 (L) 10/06/2016   MCV 92.9 10/06/2016   PLT 120 (L) 10/06/2016      Chemistry      Component Value Date/Time   NA 137 03/23/2016 1018   NA 140 12/15/2015 0855   K 3.4 (L) 03/23/2016 1018   K 5.5 No visable hemolysis (H) 01/17/2016 1036   CL 100 (L) 03/23/2016 1018   CO2 29 03/23/2016 1018   CO2 24 12/15/2015 0855   BUN 35 (H) 03/23/2016 1018   BUN 28.5 (H) 12/15/2015 0855   CREATININE 1.95 (H) 03/23/2016 1018   CREATININE 1.6 (H) 12/15/2015 0855      Component Value Date/Time   CALCIUM 8.2 (L) 03/23/2016 1018   CALCIUM 8.9 12/15/2015 0855   ALKPHOS 82 12/15/2015 0855   AST 56 (H) 12/15/2015 0855   ALT 33 12/15/2015 0855   BILITOT 0.38 12/15/2015 0855       ASSESSMENT & PLAN:  Pancytopenia, acquired ([place]) His bone marrow biopsy showed no signs of myelodysplastic syndrome or  lymphoma. CT scan of the abdomen showed no evidence of liver cirrhosis or splenomegaly. Overall, I think the mild leukopenia and thrombocytopenia could be due to very early signs of myelodysplastic syndrome but he is not symptomatic and would not require treatment for that. He does not need further treatment for leukopenia. The cause of thrombocytopenia could be related to his underlying medical condition. It is mild and there is little change compared from previous platelet count. The patient denies recent history of bleeding such as epistaxis, hematuria or hematochezia. He is asymptomatic from the thrombocytopenia. I will observe for now.  he does not require further evaluation or treatment for this   Iron deficiency anemia He had intermittent need for intravenous iron Repeat stat iron studies today show adequate iron studies He does not need IV iron and I will cancel his treatment next week  Anemia due to chronic renal failure treated with erythropoietin The patient has multifactorial anemia, likely combination of iron deficiency anemia and anemia of chronic  renal failure. He has recent adequate intravenous iron replacement. We discussed the use of ESA We discussed some of the risks, benefits, and alternatives of erythropoietin stimulating agents such as Procrit or Aranesp. The patient is symptomatic from anemia and the EPO level is low. Some of the side-effects to be expected including risks of allergic reactions, skin rashes, headaches, risk of blood clots including heart attack and stroke. There is rare risks of causing growth of cancers.The patient is willing to proceed and went ahead to sign consent today. My goal would be to keep hemoglobin greater than 10.   I would give him a dose today Based on prior records, he has not needed frequent treatment I will keep the appointment in October as scheduled    Orders Placed This Encounter  Procedures  . Iron and TIBC    Standing Status:   Future    Number of Occurrences:   1    Standing Expiration Date:   11/10/2017  . Ferritin    Standing Status:   Future    Number of Occurrences:   1    Standing Expiration Date:   11/10/2017    All questions were answered. The patient knows to call the clinic with any problems, questions or concerns. No barriers to learning was detected.  I spent 15 minutes counseling the patient face to face. The total time spent in the appointment was 20 minutes and more than 50% was on counseling.     Heath Lark, MD 6/15/20184:45 PM

## 2016-10-06 NOTE — Assessment & Plan Note (Signed)
The patient has multifactorial anemia, likely combination of iron deficiency anemia and anemia of chronic renal failure. He has recent adequate intravenous iron replacement. We discussed the use of ESA We discussed some of the risks, benefits, and alternatives of erythropoietin stimulating agents such as Procrit or Aranesp. The patient is symptomatic from anemia and the EPO level is low. Some of the side-effects to be expected including risks of allergic reactions, skin rashes, headaches, risk of blood clots including heart attack and stroke. There is rare risks of causing growth of cancers.The patient is willing to proceed and went ahead to sign consent today. My goal would be to keep hemoglobin greater than 10.   I would give him a dose today Based on prior records, he has not needed frequent treatment I will keep the appointment in October as scheduled

## 2016-10-06 NOTE — Telephone Encounter (Signed)
Scheduled appt per 6/15 los - Gave patient AVS and calender per LOS.  

## 2016-10-06 NOTE — Assessment & Plan Note (Signed)
His bone marrow biopsy showed no signs of myelodysplastic syndrome or lymphoma. CT scan of the abdomen showed no evidence of liver cirrhosis or splenomegaly. Overall, I think the mild leukopenia and thrombocytopenia could be due to very early signs of myelodysplastic syndrome but he is not symptomatic and would not require treatment for that. He does not need further treatment for leukopenia. The cause of thrombocytopenia could be related to his underlying medical condition. It is mild and there is little change compared from previous platelet count. The patient denies recent history of bleeding such as epistaxis, hematuria or hematochezia. He is asymptomatic from the thrombocytopenia. I will observe for now.  he does not require further evaluation or treatment for this

## 2016-10-06 NOTE — Assessment & Plan Note (Signed)
He had intermittent need for intravenous iron Repeat stat iron studies today show adequate iron studies He does not need IV iron and I will cancel his treatment next week

## 2016-10-09 ENCOUNTER — Telehealth: Payer: Self-pay | Admitting: *Deleted

## 2016-10-09 NOTE — Telephone Encounter (Signed)
LM with note below. To call back to confirm.

## 2016-10-09 NOTE — Telephone Encounter (Signed)
-----   Message from Heath Lark, MD sent at 10/06/2016  4:41 PM EDT ----- Regarding: iron levels Let him know iron levels are good No need IV iron next 2 weeks I will cancel Keep appt in October as scheduled

## 2016-10-09 NOTE — Telephone Encounter (Signed)
Pt called and results given.  

## 2016-10-13 ENCOUNTER — Ambulatory Visit: Payer: Self-pay

## 2016-10-16 ENCOUNTER — Encounter: Payer: Self-pay | Admitting: Neurology

## 2016-10-16 ENCOUNTER — Ambulatory Visit (INDEPENDENT_AMBULATORY_CARE_PROVIDER_SITE_OTHER): Payer: Medicare Other | Admitting: Neurology

## 2016-10-16 VITALS — BP 107/37 | HR 46 | Wt 167.4 lb

## 2016-10-16 DIAGNOSIS — Z7901 Long term (current) use of anticoagulants: Secondary | ICD-10-CM | POA: Diagnosis not present

## 2016-10-16 DIAGNOSIS — D61818 Other pancytopenia: Secondary | ICD-10-CM

## 2016-10-16 DIAGNOSIS — I6523 Occlusion and stenosis of bilateral carotid arteries: Secondary | ICD-10-CM

## 2016-10-16 DIAGNOSIS — I610 Nontraumatic intracerebral hemorrhage in hemisphere, subcortical: Secondary | ICD-10-CM | POA: Diagnosis not present

## 2016-10-16 DIAGNOSIS — I10 Essential (primary) hypertension: Secondary | ICD-10-CM

## 2016-10-16 DIAGNOSIS — I480 Paroxysmal atrial fibrillation: Secondary | ICD-10-CM

## 2016-10-16 NOTE — Patient Instructions (Addendum)
-   continue eliquis and lipitor for stroke prevention. - check BP at home. BP goal < 130 to avoid hemorrhage while on eliquis - continue follow up with cardiology - call Dr. Gwenlyn Found office to schedule the follow up carotid ultrasound - Follow up with your primary care physician for stroke risk factor modification. Recommend maintain blood pressure goal <130/80, diabetes with hemoglobin A1c goal below 6.5% and lipids with LDL cholesterol goal below 70 mg/dL.  - continue home exercise - follow up as needed

## 2016-10-16 NOTE — Progress Notes (Signed)
STROKE NEUROLOGY FOLLOW UP NOTE  NAME: [name] DOB: [birth year]  REASON FOR VISIT: stroke follow up HISTORY FROM: chart and pt  Today we had the pleasure of seeing [name] in follow-up at our Neurology Clinic. Pt was accompanied by son.   History Summary [name] is an 66 y.o. male who has known Afib on Eliquis was admitted on 04/07/14 for suddenly felt dizzy, left facial droop and left sided weakness, arm>leg. CT head showed a right IC basal ganglia hemorrhage with ventricular extension. BP 158/85. s/p Kcentra. Repeat CT brain on 04/08/14 Right thalamic intraparenchymal hematoma stable and without hydrocephalus. CT brain on 04/10/14 of brain revealed Stable intra-axial hemorrhage centered at the right thalamus with small volume intraventricular extension of blood and no ventriculomegaly. Patient continues to have left upper and lower extremity weakness but has improved. Pt was discharged to rehab.   07/01/14 follow up - the patient has been doing better. He stayed in rehab for 3 weeks and then sent to SNF. On 05/03/14 he had repeat CT showed resolution of the ICH and ASA was started. His left LE strength almost back to baseline and he can walk with walker. LUE more weak at the shoulder. He is going to be discharged home next week. He follows with Dr. Haroldine Laws at cardiology next week.   11/03/14 follow up - pt has been doing well. Resumed eliquis in 06/2014 after discussion with Dr. Haroldine Laws, tolerating well. He had left eye cataract on 10/12/14 and will have left eye surgery next Monday. He recently got cortisol injection to his right hip for pain management, stopped Eliquis for 3 days. Currently resumed. Otherwise no complains. BP 113/40 in clinic today.  05/05/15 follow up - pt has been doing well. Continued on eliquis without bleeding side effects. BP controlled well, today 117/42. No PT OT currently due to insurance coverage. Had recent follow up with Dr. Alvester Chou, had  CUS showed stable right ICA 40-59% stenosis and stable left 60-79% stenosis. Will need follow up in one year. He has TTE scheduled next month.   10/06/15 follow up - pt has been doing the same. He came in wheelchair but stated he walks with walker at home. BP controlled well at home 130/80, today in [place]. Still has home PT/OT. Continue following up with Dr. Haroldine Laws in cardiology. No other complains.   Interval History During the interval time, pt has been doing the same, no neuro changes. He still in wheel chair most of the time but can walk with walker short distance. He follows with Dr. Haroldine Laws and on eliquis. He also follows with oncology for pancytopenia and iron infusion, H&H stable. He has not had CUS done yet but Dr. Gwenlyn Found seems ordered CUS in 05/2016 but never got done. BP today 107/73. He is doing home exercise now.   REVIEW OF SYSTEMS: Full 14 system review of systems performed and notable only for those listed below and in HPI above, all others are negative:  Constitutional:   Cardiovascular:  Ear/Nose/Throat:   Skin:  Eyes:   Respiratory:   Gastroitestinal:   Genitourinary:  Hematology/Lymphatic:   Endocrine:  Musculoskeletal:   Allergy/Immunology:   Neurological:   Psychiatric:  Sleep:   The following represents the patient's updated allergies and side effects list: No Known Allergies  The neurologically relevant items on the patient's problem list were reviewed on today's visit.  Neurologic Examination  A problem focused neurological exam (12 or more points of the single  system neurologic examination, vital signs counts as 1 point, cranial nerves count for 8 points) was performed.  Blood pressure (!) 107/37, pulse (!) 46, weight 167 lb 6.4 oz (75.9 kg).  General - Well nourished, well developed, in no apparent distress.  Ophthalmologic - Sharp disc margins OU.  Cardiovascular - Regular rate and rhythm with no murmur.  Mental Status -  Level of arousal  and orientation to time, place, and person were intact. Language including expression, naming, repetition, comprehension, reading, and writing was assessed and found intact. Fund of Knowledge was assessed and was intact.  Cranial Nerves II - XII - II - Visual field intact OU. III, IV, VI - Extraocular movements intact. V - Facial sensation symmetrical. VII - Facial movement intact bilaterally. VIII - Hearing & vestibular intact bilaterally. X - Palate elevates symmetrically. XI - Chin turning & shoulder shrug intact bilaterally. XII - Tongue protrusion intact.  Motor Strength - The patient's strength was normal in all extremities except left hand mild dexterity difficulty as well as left LE proximal 4+/5 and pronator drift was absent.  Bulk was normal and fasciculations were absent.   Motor Tone - Muscle tone was assessed at the neck and appendages and was normal.  Reflexes - The patient's reflexes were normal in all extremities and he had no pathological reflexes.  Sensory - Light touch, temperature/pinprick were assessed and were symmetrical.   Coordination - The patient had ataxia on FTN at left hand which is out of proportion to the weakness.  Tremor was absent.  Gait and Station - in wheelchair, gait not tested.  Data reviewed: I personally reviewed the images and agree with the radiology interpretations.  Ct Head Wo Contrast 04/10/2014  1. Stable intra-axial hemorrhage centered at the right thalamus with small volume intraventricular extension of blood.  2. Stable mild regional mass effect. No ventriculomegaly.  3. No new intracranial abnormality.   04/07/14:  1. Acute posterior RIGHT basal ganglia and internal capsule hemorrhage measuring 15 mm x 23 mm. 2. 4 mm of RIGHT to LEFT midline shift. 3. Area of low attenuation in the posterior LEFT temporal lobe may represent subacute ischemia but the appearance is more suggestive of either an old infarct or asymmetric  chronic ischemic white matter disease.  04/08/14: Stable exams inch yesterday. Right thalamic intraparenchymal hematoma again measured at 15 x 24 mm. Mild surrounding edema. Small amount of intraventricular hemorrhage, not increased and without hydrocephalus.  CUS - The vertebral arteries appear patent with antegrade flow. - Findings consistent with 41 - 79 percent stenosis involving the right internal carotid artery. - Findings consistent with 60 - 79 percent stenosis involving the left internal carotid artery.  EKG Ectopic atrial tachycardia, unifocal, Nonspecific ST and T wave abnormality  TTE 06/10/15 - Compared to a prior echo in 2015, the EF has improved to 55-60%,  there is a wide diastolic color jet below the bioprosthetic  aortic valve which may represent moderate to severe AI. This is a  new finding. Unfortunately, the valve leaflets are occluded by  the annular ring. Consider TEE for further imaging of the aortic  bioprosthesis.  Assessment: As you may recall, he is a 66 y.o. Caucasian male with PMH of known Afib on Eliquis was admitted on 04/07/14 for right BG hemorrhage with ventricular extension. BP 158/85. s/p Kcentra. Repeat CT showed stable hematoma without hydrocephalus. Patient continued to have left upper and lower extremity weakness but has improved. Pt was discharged to rehab. In 04/2014  repeat CT showed resolution of hemorrhage and ASA started. After discuss with his cardiologist Dr. Haroldine Laws, resumed eliquis in 06/2014. So far pt tolerating the medication well. Following up with cardiology for annual follow up of b/l ICA stenosis. Last CUS showed stable right ICA 40-59% and left 60-79% stenosis.  Plan:  - continue eliquis and lipitor for stroke prevention. - check BP at home. BP goal < 130 to avoid hemorrhage while on eliquis - continue follow up with cardiology - call Dr. Gwenlyn Found office to schedule the follow up carotid ultrasound - Follow up with  your primary care physician for stroke risk factor modification. Recommend maintain blood pressure goal <130/80, diabetes with hemoglobin A1c goal below 6.5% and lipids with LDL cholesterol goal below 70 mg/dL.  - continue home exercise - follow up as needed  I spent more than 25 minutes of face to face time with the patient. Greater than 50% of time was spent in counseling and coordination of care.    No orders of the defined types were placed in this encounter.   Meds ordered this encounter  Medications  . pantoprazole (PROTONIX) 40 MG tablet    Sig: TAKE 1 TABLET BY MOUTH DAILY AS NEEDED FOR INDIGESTION/REFLUX    Refill:  3    Patient Instructions  - continue eliquis and lipitor for stroke prevention. - check BP at home. BP goal < 130 to avoid hemorrhage while on eliquis - continue follow up with cardiology - call Dr. Gwenlyn Found office to schedule the follow up carotid ultrasound - Follow up with your primary care physician for stroke risk factor modification. Recommend maintain blood pressure goal <130/80, diabetes with hemoglobin A1c goal below 6.5% and lipids with LDL cholesterol goal below 70 mg/dL.  - continue home exercise - follow up as needed   Rosalin Hawking, MD PhD [facility] [address] [place], [place] [address] [phone]

## 2016-10-20 ENCOUNTER — Ambulatory Visit: Payer: Self-pay

## 2016-10-30 ENCOUNTER — Ambulatory Visit (HOSPITAL_COMMUNITY)
Admission: RE | Admit: 2016-10-30 | Discharge: 2016-10-30 | Disposition: A | Discharge status: Home / Self Care | Payer: Medicare Other | Source: Ambulatory Visit | Attending: Cardiovascular Disease | Admitting: Cardiovascular Disease

## 2016-10-30 DIAGNOSIS — Z8673 Personal history of transient ischemic attack (TIA), and cerebral infarction without residual deficits: Secondary | ICD-10-CM | POA: Insufficient documentation

## 2016-10-30 DIAGNOSIS — I2510 Atherosclerotic heart disease of native coronary artery without angina pectoris: Secondary | ICD-10-CM | POA: Diagnosis not present

## 2016-10-30 DIAGNOSIS — I6501 Occlusion and stenosis of right vertebral artery: Secondary | ICD-10-CM | POA: Diagnosis not present

## 2016-10-30 DIAGNOSIS — I10 Essential (primary) hypertension: Secondary | ICD-10-CM | POA: Diagnosis not present

## 2016-10-30 DIAGNOSIS — E785 Hyperlipidemia, unspecified: Secondary | ICD-10-CM | POA: Insufficient documentation

## 2016-10-30 DIAGNOSIS — J449 Chronic obstructive pulmonary disease, unspecified: Secondary | ICD-10-CM | POA: Insufficient documentation

## 2016-10-30 DIAGNOSIS — Z951 Presence of aortocoronary bypass graft: Secondary | ICD-10-CM | POA: Diagnosis not present

## 2016-10-30 DIAGNOSIS — I6523 Occlusion and stenosis of bilateral carotid arteries: Secondary | ICD-10-CM

## 2016-10-30 DIAGNOSIS — I739 Peripheral vascular disease, unspecified: Secondary | ICD-10-CM | POA: Insufficient documentation

## 2016-10-30 DIAGNOSIS — Z720 Tobacco use: Secondary | ICD-10-CM | POA: Diagnosis not present

## 2016-11-01 ENCOUNTER — Other Ambulatory Visit: Payer: Self-pay | Admitting: Cardiovascular Disease

## 2016-11-01 DIAGNOSIS — I779 Disorder of arteries and arterioles, unspecified: Secondary | ICD-10-CM

## 2016-11-01 DIAGNOSIS — I739 Peripheral vascular disease, unspecified: Principal | ICD-10-CM

## 2016-12-04 ENCOUNTER — Ambulatory Visit (HOSPITAL_COMMUNITY)
Admission: RE | Admit: 2016-12-04 | Discharge: 2016-12-04 | Disposition: A | Discharge status: Home / Self Care | Payer: Medicare Other | Source: Ambulatory Visit | Attending: Internal Medicine | Admitting: Internal Medicine

## 2016-12-04 ENCOUNTER — Encounter (HOSPITAL_COMMUNITY): Payer: Self-pay | Admitting: Internal Medicine

## 2016-12-04 VITALS — BP 144/72 | HR 62 | Wt 171.0 lb

## 2016-12-04 DIAGNOSIS — I5022 Chronic systolic (congestive) heart failure: Secondary | ICD-10-CM | POA: Diagnosis not present

## 2016-12-04 DIAGNOSIS — I480 Paroxysmal atrial fibrillation: Secondary | ICD-10-CM | POA: Insufficient documentation

## 2016-12-04 DIAGNOSIS — I1310 Hypertensive heart and chronic kidney disease without heart failure, with stage 1 through stage 4 chronic kidney disease, or unspecified chronic kidney disease: Secondary | ICD-10-CM | POA: Diagnosis not present

## 2016-12-04 DIAGNOSIS — J439 Emphysema, unspecified: Secondary | ICD-10-CM

## 2016-12-04 DIAGNOSIS — N183 Chronic kidney disease, stage 3 (moderate): Secondary | ICD-10-CM | POA: Insufficient documentation

## 2016-12-04 DIAGNOSIS — Z8673 Personal history of transient ischemic attack (TIA), and cerebral infarction without residual deficits: Secondary | ICD-10-CM | POA: Diagnosis not present

## 2016-12-04 DIAGNOSIS — I2510 Atherosclerotic heart disease of native coronary artery without angina pectoris: Secondary | ICD-10-CM | POA: Insufficient documentation

## 2016-12-04 DIAGNOSIS — E875 Hyperkalemia: Secondary | ICD-10-CM | POA: Diagnosis not present

## 2016-12-04 DIAGNOSIS — J449 Chronic obstructive pulmonary disease, unspecified: Secondary | ICD-10-CM | POA: Diagnosis not present

## 2016-12-04 DIAGNOSIS — Z951 Presence of aortocoronary bypass graft: Secondary | ICD-10-CM | POA: Diagnosis not present

## 2016-12-04 DIAGNOSIS — M109 Gout, unspecified: Secondary | ICD-10-CM | POA: Insufficient documentation

## 2016-12-04 DIAGNOSIS — E785 Hyperlipidemia, unspecified: Secondary | ICD-10-CM | POA: Insufficient documentation

## 2016-12-04 DIAGNOSIS — M549 Dorsalgia, unspecified: Secondary | ICD-10-CM | POA: Insufficient documentation

## 2016-12-04 DIAGNOSIS — I352 Nonrheumatic aortic (valve) stenosis with insufficiency: Secondary | ICD-10-CM | POA: Insufficient documentation

## 2016-12-04 DIAGNOSIS — I5042 Chronic combined systolic (congestive) and diastolic (congestive) heart failure: Secondary | ICD-10-CM | POA: Insufficient documentation

## 2016-12-04 DIAGNOSIS — Z7901 Long term (current) use of anticoagulants: Secondary | ICD-10-CM | POA: Diagnosis not present

## 2016-12-04 DIAGNOSIS — I6501 Occlusion and stenosis of right vertebral artery: Secondary | ICD-10-CM | POA: Diagnosis not present

## 2016-12-04 DIAGNOSIS — Z9981 Dependence on supplemental oxygen: Secondary | ICD-10-CM | POA: Insufficient documentation

## 2016-12-04 DIAGNOSIS — E871 Hypo-osmolality and hyponatremia: Secondary | ICD-10-CM | POA: Insufficient documentation

## 2016-12-04 DIAGNOSIS — N179 Acute kidney failure, unspecified: Secondary | ICD-10-CM | POA: Insufficient documentation

## 2016-12-04 DIAGNOSIS — Z9889 Other specified postprocedural states: Secondary | ICD-10-CM | POA: Diagnosis not present

## 2016-12-04 DIAGNOSIS — F1010 Alcohol abuse, uncomplicated: Secondary | ICD-10-CM | POA: Insufficient documentation

## 2016-12-04 DIAGNOSIS — Z953 Presence of xenogenic heart valve: Secondary | ICD-10-CM | POA: Insufficient documentation

## 2016-12-04 NOTE — Patient Instructions (Signed)
Your physician has requested that you have an echocardiogram. Echocardiography is a painless test that uses sound waves to create images of your heart. It provides your doctor with information about the size and shape of your heart and how well your heart's chambers and valves are working. This procedure takes approximately one hour. There are no restrictions for this procedure.  We will contact you in 3 months to schedule your next appointment.  

## 2016-12-04 NOTE — Progress Notes (Signed)
Advanced Heart Failure Clinic Note   Patient ID: [name], male   DOB: 06-08-1950, 66 y.o.   MRN: [id number]   PCP: Dr. Ashby Dawes  EP: Dr Lovena Le  Renal: Dr. Joelyn Oms  HPI: [name] is 66 y.o. male with past medical history of severe aortic stenosis s/p tissue AV replacement (09/2012) atrial fibrillation s/p MAZE (09/2012)  chronic combined systolic/diastolic HF, RV failure, HTN, PAD s/p L iliac stent in 2010 and aortobifemoral bypass graft, ETOH abuse, severe COPD with cor pulmonale on home oxygen, CAD s/p CABG with AV replacement and back pain from spinal stenosis.   Admitted and treated for acute heart failure complicated by listeria bacteremia and Afib RVR 09/2013. Diuresed with IV lasix transitioned to po lasix.He had TEE DC-CV but went back into afib with controlled rate on amiodarone and eliquis. He was treated with 2 weeks of antibiotics. Discharge weight was 226 pounds.  Amiodarone was stopped as patient did not hold NSR.    Patient was admitted to [facility] in 12/15 with right basal ganglia intracerebral hemorrhage and left hemiparesis.  He had been on Eliquis.  He eventually was sent to inpatient rehab, where due to a combination of ongoing diuretic use and poor po intake, he developed AKI and hyponatremia.  Diuretics and ACEI were held, and creatinine returned to baseline.    He presents today for follow up. Doing fairly well. Has not needed any diuretics for many months. No edema, orthopnea or PND. Says he feels good. Remains in [facility] for most part but now has lift chair that allows him to get up to walker.  Breathing has been good. No LE sores.  Weight stable at 170. Wears O2 at 3L continuously.   Labs 03/17/16 Creatine 2.73 K 3.0 Labs 03/23/16 Creatine 1.95 K 3.4   ECHO 2/17 EF 55-60% RV down  ECHO 05/2014 EF 50-55% bioprosthetic aortic valve ok very mild gradient RV normal size. Mild HK  ECHO 30-35% 08/2012  ECHO 08/2013 EF 50-55% Grade II DD  TEE 6/15 EF 40-45%, bioprosthetic  aortic valve ok   PFTs 9/14 FEV1 1.1L (34%) FVC 2.57L (57%) FEF 25-75% (17%) DLCO 41%  Labs (9/15): LDL 106 Labs (05/05/14): K 3.4, creatinine 1.34, digoxin 0.4 Labs 06/10/2014: K 5.6 Creatinine 2.7 BNP 215 Labs 06/19/2014: K 4.9 Creatinine 2.28 K and spiro stopped.  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 Drink alcohol occasionally . Denies drug use. Lives alone   FH: Father died MI at 63 Brother MI   ROS: All systems negative except as listed in HPI, PMH and Problem List.  Past Medical History:  Diagnosis Date  . Aortic stenosis    echo 06/19/08 with nomal LV function, moderate concentric LVH moderate aortic stenosis area 0.99 cm squared, peak gradient of 50 and mean of 31  . Aortic stenosis, severe 09/23/2012  . AS (aortic stenosis) with AVR with pericardial tissue valve  09/30/2013  . Atrial fibrillation with RVR, was in atrial fib in 04/2011 09/16/2012   a) s/p MAZE (09/2012)   . Back pain, spinal stenosis 09/30/2013  . Bilateral carotid artery disease ([place])   . CAD (coronary artery disease), single vessel disease 09/23/2012   a) CABG (SVG to PDA and PL) wtih AVR (09/2012)   . Chronic combined systolic and diastolic CHF (congestive heart failure) ([place]) 09/16/2012   a) ECHO (08/2013) EF 50-55%, grade II DD b) TEE ECHO (09/2013): 40-45%, bioprosthesis present, mild Dustin  .  COPD (chronic obstructive pulmonary disease) ([place])   . Gout   . Gout flare. 09/29/12, Lt knee, improved with colchicine. 09/30/2012  . H/O aortic valve replacement   . Heart murmur   . Hyperkalemia 11/03/2015  . Hyperlipidemia   . Hypertension   . Need for hepatitis C screening test 11/05/2015  . PAD (peripheral artery disease), decreased bil. ABIs 09/23/2012   a) ABIs (11/2013): Right mild arterial insufficiency, L normal; aroto-bifem bypass graft: not well visualized, bilateral SFAs = to greater than 50% in dimaeter reduction   . Stroke ([place])   . Tobacco use 09/16/2012     Current Outpatient Prescriptions  Medication Sig Dispense Refill  . albuterol (PROVENTIL HFA;VENTOLIN HFA) 108 (90 BASE) MCG/ACT inhaler Inhale 2 puffs into the lungs every 6 (six) hours as needed for wheezing or shortness of breath. 1 Inhaler 2  . amiodarone (PACERONE) 200 MG tablet Take 0.5 tablets (100 mg total) by mouth daily. 45 tablet 3  . apixaban (ELIQUIS) 5 MG TABS tablet Take 1 tablet (5 mg total) by mouth 2 (two) times daily. 60 tablet 11  . atorvastatin (LIPITOR) 80 MG tablet TAKE 1 TABLET EVERY DAY AT 6PM 30 tablet 11  . carvedilol (COREG) 25 MG tablet Take 0.5 tablets (12.5 mg total) by mouth 2 (two) times daily. 30 tablet 8  . diclofenac sodium (VOLTAREN) 1 % GEL Apply 4 g topically 4 (four) times daily.    . Fe Fum-FePoly-Vit C-Vit B3 (INTEGRA) 62.5-62.5-40-3 MG CAPS Take 1 capsule by mouth daily.   4  . folic acid (FOLVITE) 1 MG tablet Take 1 tablet (1 mg total) by mouth daily. 30 tablet 6  . OXYGEN-HELIUM IN Inhale 1-2 L into the lungs continuous. Is in the habit of not using his oxygen when he leaves the house. Uses 1L continuous normally Uses 2L for exertion    . tiZANidine (ZANAFLEX) 4 MG tablet Take 1 tablet by mouth 2 (two) times daily.  5  . traMADol (ULTRAM) 50 MG tablet Take 1 tablet (50 mg total) by mouth 4 (four) times daily. 120 tablet 0  . torsemide (DEMADEX) 20 MG tablet as needed.      No current facility-administered medications for this encounter.    Vitals:   12/04/16 1031  BP: (!) 144/72  Pulse: 62  SpO2: 99%  Weight: 171 lb (77.6 kg)    Wt Readings from Last 3 Encounters:  12/04/16 171 lb (77.6 kg)  10/16/16 167 lb 6.4 oz (75.9 kg)  10/06/16 168 lb 3.2 oz (76.3 kg)     PHYSICAL EXAM: General:  Elderly appearing. Seated in WC. Son present.  HEENT: Normal anicteric Neck: supple. JVP flat carotids 2+ bilaterally; no bruits. No thyromegaly or nodule noted.  Cor: PMI normal. RRR 2/6 SEM at RUSB. No AI heard Lungs: Barrel chested. Decreased  throughout. No wheeze  Abdomen: soft NT/ND good BS. No HSM  Extremities: no cyanosis,rash, edema. + clubbing  Neuro: alert & orientedx3, cranial nerves grossly intact. Left-sided weakness.    ASSESSMENT & PLAN:  1.Acute/Chronic  Biventricular HF (R>L): Suspect mixed ischemic/nonischemic cardiomyopathy,  EF 40-45% on 6/15 TEE. >> Echo 06/10/2015 LVEF 55%, RV mildy HK. ? Prosthetic valve AI  -Functional status limited due to previous ICH/stroke. But has progressed with therapy and now able to get up to a walker.  - Volume status looks good.  - No need for diuretics at this point Can use PRN.  - Continue carvedilol 12.5 BID.  2. Severe COPD with  cor pulmonale:  - Uses O2 continuously.  Sees Dr. [place] Bells.  No change 3 PAF: s/p MAZE. Failed DC-CV x3 in November 2014 and June 2015. However, he was in NSR in 12/15 at time of hospital admission.   - Remains in NSR. Continue amio 100 mg daily.  He will need yearly eye exam and  PFTs.  - Continue eliquis 5 bidNo bleeding problems. 4. CAD:  - no s/s ischemia - s/p CABG bioprosthetic AVR.  Off ASA with Eliquis 5. H/o Intracerebral hemorrhage:  - Still has left-sided weakness. Continue eliquis per neuro.   6. CKD stage III- IV - Recently stable. Followed by Dr. Joelyn Oms.   7. S/p AVR - Stable. On echo last year there was some some question of a jet around the valve with moderate AI. TEE in 2015 ok. No AI on exam - Will repeat echo - Continue SBE prophylaxis 8. Carotid stenosis - u/s 7/18. - 40-59% RICA stenosis, based on systolic velocity. - 61-53% LICA stenosis, based on systolic velocity. - Chronic right vertebral artery occlusion. - Significantly elevated velocities and color doppler turbulence in the right subclavian artery. - Followed by Dr. Gwenlyn Found  He is moving to [place], [place] in December. I have reached out to Dr. Emelia Loron at [facility] to help him connect with HF doc out there.   Glori Bickers, MD 12/04/2016

## 2016-12-18 ENCOUNTER — Ambulatory Visit (HOSPITAL_COMMUNITY)
Admission: RE | Admit: 2016-12-18 | Discharge: 2016-12-18 | Disposition: A | Discharge status: Home / Self Care | Payer: Medicare Other | Source: Ambulatory Visit | Attending: Cardiology | Admitting: Cardiology

## 2016-12-18 DIAGNOSIS — J449 Chronic obstructive pulmonary disease, unspecified: Secondary | ICD-10-CM | POA: Insufficient documentation

## 2016-12-18 DIAGNOSIS — I110 Hypertensive heart disease with heart failure: Secondary | ICD-10-CM | POA: Insufficient documentation

## 2016-12-18 DIAGNOSIS — E785 Hyperlipidemia, unspecified: Secondary | ICD-10-CM | POA: Diagnosis not present

## 2016-12-18 DIAGNOSIS — I5022 Chronic systolic (congestive) heart failure: Secondary | ICD-10-CM | POA: Diagnosis not present

## 2016-12-18 DIAGNOSIS — Z720 Tobacco use: Secondary | ICD-10-CM | POA: Diagnosis not present

## 2016-12-18 NOTE — Progress Notes (Addendum)
  Echocardiogram 2D Echocardiogram has been performed. Technically difficult exam due to poor patient mobility. Unable to lay in LLD position.  [name] 12/18/2016, 11:31 AM

## 2016-12-28 DIAGNOSIS — E782 Mixed hyperlipidemia: Secondary | ICD-10-CM | POA: Diagnosis not present

## 2016-12-28 DIAGNOSIS — Z0000 Encounter for general adult medical examination without abnormal findings: Secondary | ICD-10-CM | POA: Diagnosis not present

## 2016-12-28 DIAGNOSIS — I2510 Atherosclerotic heart disease of native coronary artery without angina pectoris: Secondary | ICD-10-CM | POA: Diagnosis not present

## 2016-12-28 DIAGNOSIS — I10 Essential (primary) hypertension: Secondary | ICD-10-CM | POA: Diagnosis not present

## 2017-01-02 DIAGNOSIS — E039 Hypothyroidism, unspecified: Secondary | ICD-10-CM | POA: Diagnosis not present

## 2017-01-08 ENCOUNTER — Other Ambulatory Visit (HOSPITAL_COMMUNITY): Payer: Self-pay | Admitting: Internal Medicine

## 2017-01-08 ENCOUNTER — Other Ambulatory Visit: Payer: Self-pay | Admitting: Internal Medicine

## 2017-01-08 ENCOUNTER — Other Ambulatory Visit (HOSPITAL_COMMUNITY): Payer: Self-pay | Admitting: Endocrinology

## 2017-01-08 DIAGNOSIS — I2510 Atherosclerotic heart disease of native coronary artery without angina pectoris: Secondary | ICD-10-CM | POA: Diagnosis not present

## 2017-01-08 DIAGNOSIS — E0590 Thyrotoxicosis, unspecified without thyrotoxic crisis or storm: Secondary | ICD-10-CM

## 2017-01-08 DIAGNOSIS — E782 Mixed hyperlipidemia: Secondary | ICD-10-CM | POA: Diagnosis not present

## 2017-01-08 DIAGNOSIS — Z23 Encounter for immunization: Secondary | ICD-10-CM | POA: Diagnosis not present

## 2017-01-08 DIAGNOSIS — I129 Hypertensive chronic kidney disease with stage 1 through stage 4 chronic kidney disease, or unspecified chronic kidney disease: Secondary | ICD-10-CM | POA: Diagnosis not present

## 2017-01-08 DIAGNOSIS — I5022 Chronic systolic (congestive) heart failure: Secondary | ICD-10-CM | POA: Diagnosis not present

## 2017-01-08 DIAGNOSIS — I739 Peripheral vascular disease, unspecified: Secondary | ICD-10-CM | POA: Diagnosis not present

## 2017-01-08 DIAGNOSIS — Z952 Presence of prosthetic heart valve: Secondary | ICD-10-CM | POA: Diagnosis not present

## 2017-01-08 DIAGNOSIS — D500 Iron deficiency anemia secondary to blood loss (chronic): Secondary | ICD-10-CM | POA: Diagnosis not present

## 2017-01-14 DIAGNOSIS — Z1211 Encounter for screening for malignant neoplasm of colon: Secondary | ICD-10-CM | POA: Diagnosis not present

## 2017-01-14 DIAGNOSIS — Z1212 Encounter for screening for malignant neoplasm of rectum: Secondary | ICD-10-CM | POA: Diagnosis not present

## 2017-01-15 ENCOUNTER — Ambulatory Visit
Admission: RE | Admit: 2017-01-15 | Discharge: 2017-01-15 | Disposition: A | Discharge status: Home / Self Care | Payer: Medicare Other | Source: Ambulatory Visit | Attending: Internal Medicine | Admitting: Internal Medicine

## 2017-01-15 DIAGNOSIS — E0590 Thyrotoxicosis, unspecified without thyrotoxic crisis or storm: Secondary | ICD-10-CM

## 2017-01-23 DIAGNOSIS — R195 Other fecal abnormalities: Secondary | ICD-10-CM | POA: Diagnosis not present

## 2017-01-24 ENCOUNTER — Encounter (HOSPITAL_COMMUNITY)
Admission: RE | Admit: 2017-01-24 | Discharge: 2017-01-24 | Disposition: A | Discharge status: Home / Self Care | Payer: Medicare Other | Source: Ambulatory Visit | Attending: Internal Medicine | Admitting: Internal Medicine

## 2017-01-24 DIAGNOSIS — E0590 Thyrotoxicosis, unspecified without thyrotoxic crisis or storm: Secondary | ICD-10-CM | POA: Insufficient documentation

## 2017-01-24 MED ORDER — SODIUM IODIDE I 131 CAPSULE
13.5000 | Freq: Once | INTRAVENOUS | Status: AC | PRN
  Administered 2017-01-24: 13.5 via ORAL

## 2017-01-25 ENCOUNTER — Encounter (HOSPITAL_COMMUNITY)
Admission: RE | Admit: 2017-01-25 | Discharge: 2017-01-25 | Disposition: A | Discharge status: Home / Self Care | Payer: Medicare Other | Source: Ambulatory Visit | Attending: Internal Medicine | Admitting: Internal Medicine

## 2017-01-25 DIAGNOSIS — E0590 Thyrotoxicosis, unspecified without thyrotoxic crisis or storm: Secondary | ICD-10-CM | POA: Insufficient documentation

## 2017-01-25 MED ORDER — SODIUM PERTECHNETATE TC 99M INJECTION
10.5300 | Freq: Once | INTRAVENOUS | Status: AC | PRN
  Administered 2017-01-25: 10.53 via INTRAVENOUS

## 2017-02-12 ENCOUNTER — Other Ambulatory Visit: Payer: Self-pay | Admitting: Cardiovascular Disease

## 2017-02-13 DIAGNOSIS — E0590 Thyrotoxicosis, unspecified without thyrotoxic crisis or storm: Secondary | ICD-10-CM | POA: Diagnosis not present

## 2017-02-16 ENCOUNTER — Other Ambulatory Visit (HOSPITAL_BASED_OUTPATIENT_CLINIC_OR_DEPARTMENT_OTHER): Payer: Medicare Other

## 2017-02-16 DIAGNOSIS — D508 Other iron deficiency anemias: Secondary | ICD-10-CM

## 2017-02-16 DIAGNOSIS — N183 Chronic kidney disease, stage 3 unspecified: Secondary | ICD-10-CM

## 2017-02-16 DIAGNOSIS — D631 Anemia in chronic kidney disease: Secondary | ICD-10-CM

## 2017-02-16 DIAGNOSIS — D61818 Other pancytopenia: Secondary | ICD-10-CM

## 2017-02-16 LAB — CBC WITH DIFFERENTIAL/PLATELET
BASO%: 0.9 % (ref 0.0–2.0)
Basophils Absolute: 0 10*3/uL (ref 0.0–0.1)
EOS ABS: 0.2 10*3/uL (ref 0.0–0.5)
EOS%: 5.7 % (ref 0.0–7.0)
HCT: 32 % — ABNORMAL LOW (ref 38.4–49.9)
HEMOGLOBIN: 10.3 g/dL — AB (ref 13.0–17.1)
LYMPH%: 29 % (ref 14.0–49.0)
MCH: 29.2 pg (ref 27.2–33.4)
MCHC: 32.1 g/dL (ref 32.0–36.0)
MCV: 90.8 fL (ref 79.3–98.0)
MONO#: 0.6 10*3/uL (ref 0.1–0.9)
MONO%: 14.4 % — AB (ref 0.0–14.0)
NEUT%: 50 % (ref 39.0–75.0)
NEUTROS ABS: 2.1 10*3/uL (ref 1.5–6.5)
Platelets: 133 10*3/uL — ABNORMAL LOW (ref 140–400)
RBC: 3.53 10*6/uL — AB (ref 4.20–5.82)
RDW: 13.6 % (ref 11.0–14.6)
WBC: 4.2 10*3/uL (ref 4.0–10.3)
lymph#: 1.2 10*3/uL (ref 0.9–3.3)

## 2017-02-16 LAB — FERRITIN: Ferritin: 140 ng/ml (ref 22–316)

## 2017-02-16 LAB — IRON AND TIBC
%SAT: 18 % — ABNORMAL LOW (ref 20–55)
IRON: 52 ug/dL (ref 42–163)
TIBC: 281 ug/dL (ref 202–409)
UIBC: 229 ug/dL (ref 117–376)

## 2017-02-19 ENCOUNTER — Telehealth: Payer: Self-pay

## 2017-02-19 ENCOUNTER — Other Ambulatory Visit: Payer: Self-pay | Admitting: Hematology and Oncology

## 2017-02-19 DIAGNOSIS — D508 Other iron deficiency anemias: Secondary | ICD-10-CM

## 2017-02-19 NOTE — Telephone Encounter (Signed)
-----   Message from Heath Lark, MD sent at 02/19/2017  6:33 AM EDT ----- Please call him Labs are OK OK to cancel appointment on THursdays Please schedule lab only appointment in 3 months ----- Message ----- From: Interface, Lab In Three Zero One Sent: 02/16/2017   9:11 AM To: Heath Lark, MD

## 2017-02-19 NOTE — Telephone Encounter (Signed)
Called with below message. Scheduling message sent.

## 2017-02-20 ENCOUNTER — Telehealth: Payer: Self-pay | Admitting: *Deleted

## 2017-02-20 NOTE — Telephone Encounter (Signed)
Notified of message below.   Msg to scheduler

## 2017-02-20 NOTE — Telephone Encounter (Signed)
-----   Message from Heath Lark, MD sent at 02/19/2017  6:33 AM EDT ----- Please call him Labs are OK OK to cancel appointment on THursdays Please schedule lab only appointment in 3 months ----- Message ----- From: Interface, Lab In Three Zero One Sent: 02/16/2017   9:11 AM To: Heath Lark, MD

## 2017-02-21 ENCOUNTER — Telehealth: Payer: Self-pay | Admitting: Hematology and Oncology

## 2017-02-21 NOTE — Telephone Encounter (Signed)
Attempted to schedule them per 10/29 sch msg - patient is currently in the middle of moving to outside of [place] and wanted to call and schedule once he is settled.

## 2017-02-22 ENCOUNTER — Ambulatory Visit: Payer: Self-pay

## 2017-02-22 ENCOUNTER — Ambulatory Visit: Payer: Self-pay | Admitting: Hematology and Oncology

## 2017-02-23 ENCOUNTER — Telehealth: Payer: Self-pay | Admitting: Hematology and Oncology

## 2017-02-23 NOTE — Telephone Encounter (Signed)
Spoke with patient regarding appt that was added per 10/30 sch msg.

## 2017-02-26 DIAGNOSIS — L309 Dermatitis, unspecified: Secondary | ICD-10-CM | POA: Diagnosis not present

## 2017-02-26 DIAGNOSIS — R195 Other fecal abnormalities: Secondary | ICD-10-CM | POA: Diagnosis not present

## 2017-02-26 DIAGNOSIS — E0580 Other thyrotoxicosis without thyrotoxic crisis or storm: Secondary | ICD-10-CM | POA: Diagnosis not present

## 2017-02-26 DIAGNOSIS — L03116 Cellulitis of left lower limb: Secondary | ICD-10-CM | POA: Diagnosis not present

## 2017-02-26 DIAGNOSIS — Z23 Encounter for immunization: Secondary | ICD-10-CM | POA: Diagnosis not present

## 2017-03-07 ENCOUNTER — Ambulatory Visit (HOSPITAL_COMMUNITY)
Admission: RE | Admit: 2017-03-07 | Discharge: 2017-03-07 | Disposition: A | Discharge status: Home / Self Care | Payer: Medicare Other | Source: Ambulatory Visit | Attending: Internal Medicine | Admitting: Internal Medicine

## 2017-03-07 ENCOUNTER — Encounter (HOSPITAL_COMMUNITY): Payer: Self-pay | Admitting: Internal Medicine

## 2017-03-07 ENCOUNTER — Encounter (HOSPITAL_COMMUNITY): Payer: Self-pay | Admitting: *Deleted

## 2017-03-07 ENCOUNTER — Other Ambulatory Visit: Payer: Self-pay

## 2017-03-07 VITALS — BP 138/58 | HR 62 | Wt 166.5 lb

## 2017-03-07 DIAGNOSIS — Z79899 Other long term (current) drug therapy: Secondary | ICD-10-CM | POA: Insufficient documentation

## 2017-03-07 DIAGNOSIS — I739 Peripheral vascular disease, unspecified: Secondary | ICD-10-CM | POA: Diagnosis not present

## 2017-03-07 DIAGNOSIS — I5032 Chronic diastolic (congestive) heart failure: Secondary | ICD-10-CM | POA: Diagnosis not present

## 2017-03-07 DIAGNOSIS — I5042 Chronic combined systolic (congestive) and diastolic (congestive) heart failure: Secondary | ICD-10-CM | POA: Diagnosis not present

## 2017-03-07 DIAGNOSIS — Z95828 Presence of other vascular implants and grafts: Secondary | ICD-10-CM | POA: Diagnosis not present

## 2017-03-07 DIAGNOSIS — I480 Paroxysmal atrial fibrillation: Secondary | ICD-10-CM | POA: Diagnosis not present

## 2017-03-07 DIAGNOSIS — Z8249 Family history of ischemic heart disease and other diseases of the circulatory system: Secondary | ICD-10-CM | POA: Insufficient documentation

## 2017-03-07 DIAGNOSIS — E785 Hyperlipidemia, unspecified: Secondary | ICD-10-CM | POA: Diagnosis not present

## 2017-03-07 DIAGNOSIS — Z953 Presence of xenogenic heart valve: Secondary | ICD-10-CM | POA: Insufficient documentation

## 2017-03-07 DIAGNOSIS — I2781 Cor pulmonale (chronic): Secondary | ICD-10-CM | POA: Insufficient documentation

## 2017-03-07 DIAGNOSIS — I69154 Hemiplegia and hemiparesis following nontraumatic intracerebral hemorrhage affecting left non-dominant side: Secondary | ICD-10-CM | POA: Diagnosis not present

## 2017-03-07 DIAGNOSIS — I6523 Occlusion and stenosis of bilateral carotid arteries: Secondary | ICD-10-CM | POA: Diagnosis not present

## 2017-03-07 DIAGNOSIS — M4800 Spinal stenosis, site unspecified: Secondary | ICD-10-CM | POA: Diagnosis not present

## 2017-03-07 DIAGNOSIS — Z951 Presence of aortocoronary bypass graft: Secondary | ICD-10-CM | POA: Insufficient documentation

## 2017-03-07 DIAGNOSIS — I350 Nonrheumatic aortic (valve) stenosis: Secondary | ICD-10-CM | POA: Diagnosis not present

## 2017-03-07 DIAGNOSIS — Z87891 Personal history of nicotine dependence: Secondary | ICD-10-CM | POA: Diagnosis not present

## 2017-03-07 DIAGNOSIS — Z9981 Dependence on supplemental oxygen: Secondary | ICD-10-CM | POA: Insufficient documentation

## 2017-03-07 DIAGNOSIS — N183 Chronic kidney disease, stage 3 (moderate): Secondary | ICD-10-CM | POA: Diagnosis not present

## 2017-03-07 DIAGNOSIS — Z7901 Long term (current) use of anticoagulants: Secondary | ICD-10-CM | POA: Diagnosis not present

## 2017-03-07 DIAGNOSIS — I130 Hypertensive heart and chronic kidney disease with heart failure and stage 1 through stage 4 chronic kidney disease, or unspecified chronic kidney disease: Secondary | ICD-10-CM | POA: Insufficient documentation

## 2017-03-07 DIAGNOSIS — M549 Dorsalgia, unspecified: Secondary | ICD-10-CM | POA: Insufficient documentation

## 2017-03-07 DIAGNOSIS — I429 Cardiomyopathy, unspecified: Secondary | ICD-10-CM | POA: Insufficient documentation

## 2017-03-07 DIAGNOSIS — I5082 Biventricular heart failure: Secondary | ICD-10-CM | POA: Insufficient documentation

## 2017-03-07 DIAGNOSIS — I2510 Atherosclerotic heart disease of native coronary artery without angina pectoris: Secondary | ICD-10-CM | POA: Insufficient documentation

## 2017-03-07 DIAGNOSIS — I6501 Occlusion and stenosis of right vertebral artery: Secondary | ICD-10-CM | POA: Diagnosis not present

## 2017-03-07 DIAGNOSIS — J449 Chronic obstructive pulmonary disease, unspecified: Secondary | ICD-10-CM | POA: Diagnosis not present

## 2017-03-07 LAB — CBC
HEMATOCRIT: 33.2 % — AB (ref 39.0–52.0)
HEMOGLOBIN: 10.5 g/dL — AB (ref 13.0–17.0)
MCH: 29.1 pg (ref 26.0–34.0)
MCHC: 31.6 g/dL (ref 30.0–36.0)
MCV: 92 fL (ref 78.0–100.0)
Platelets: 127 10*3/uL — ABNORMAL LOW (ref 150–400)
RBC: 3.61 MIL/uL — AB (ref 4.22–5.81)
RDW: 13.1 % (ref 11.5–15.5)
WBC: 4.5 10*3/uL (ref 4.0–10.5)

## 2017-03-07 LAB — BASIC METABOLIC PANEL
Anion gap: 4 — ABNORMAL LOW (ref 5–15)
BUN: 26 mg/dL — ABNORMAL HIGH (ref 6–20)
CHLORIDE: 112 mmol/L — AB (ref 101–111)
CO2: 23 mmol/L (ref 22–32)
CREATININE: 1.61 mg/dL — AB (ref 0.61–1.24)
Calcium: 8.9 mg/dL (ref 8.9–10.3)
GFR calc non Af Amer: 43 mL/min — ABNORMAL LOW (ref >60)
GFR, EST AFRICAN AMERICAN: 49 mL/min — AB (ref >60)
Glucose, Bld: 87 mg/dL (ref 65–99)
POTASSIUM: 5.7 mmol/L — AB (ref 3.5–5.1)
SODIUM: 139 mmol/L (ref 135–145)

## 2017-03-07 LAB — PROTIME-INR
INR: 1.2
Prothrombin Time: 15.1 seconds (ref 11.4–15.2)

## 2017-03-07 NOTE — Patient Instructions (Signed)
Labs today  Your physician has requested that you have a TEE. During a TEE, sound waves are used to create images of your heart. It provides your doctor with information about the size and shape of your heart and how well your heart's chambers and valves are working. In this test, a transducer is attached to the end of a flexible tube that's guided down your throat and into your esophagus (the tube leading from you mouth to your stomach) to get a more detailed image of your heart. You are not awake for the procedure. Please see the instruction sheet given to you today. For further information please visit [email or web address].  Follow up as needed

## 2017-03-07 NOTE — H&P (View-Only) (Signed)
Advanced Heart Failure Clinic Note   Patient ID: [name], male   DOB: 19-Nov-1950, 66 y.o.   MRN: [id number]   PCP: Dr. Ashby Dawes  EP: Dr Lovena Le  Renal: Dr. Joelyn Oms  HPI: [name] is 66 y.o. male with past medical history of severe aortic stenosis s/p tissue AV replacement (09/2012) atrial fibrillation s/p MAZE (09/2012)  chronic combined systolic/diastolic HF, RV failure, HTN, PAD s/p L iliac stent in 2010 and aortobifemoral bypass graft, ETOH abuse, severe COPD with cor pulmonale on home oxygen, CAD s/p CABG with AV replacement and back pain from spinal stenosis.   Admitted and treated for acute heart failure complicated by listeria bacteremia and Afib RVR 09/2013. Diuresed with IV lasix transitioned to po lasix.He had TEE DC-CV but went back into afib with controlled rate on amiodarone and eliquis. He was treated with 2 weeks of antibiotics. Discharge weight was 226 pounds.  Amiodarone was stopped as patient did not hold NSR.    Patient was admitted to [facility] in 12/15 with right basal ganglia intracerebral hemorrhage and left hemiparesis.  He had been on Eliquis.  He eventually was sent to inpatient rehab, where due to a combination of ongoing diuretic use and poor po intake, he developed AKI and hyponatremia.  Diuretics and ACEI were held, and creatinine returned to baseline.    He presents today for follow up. Doing very well. Has not needed any diuretics in a long time. Denies any dyspnea. Exercises once a day by walking around the apartment. Limited by leg weakness.  Remains in [facility] for most part but now has lift chair that allows him to get up to walker.  Breathing has been good. No LE sores.  Weight stable at 170. Wears O2 at 3L continuously.   Labs 03/17/16 Creatine 2.73 K 3.0 Labs 03/23/16 Creatine 1.95 K 3.4   ECHO 2/17 EF 55-60% RV down  ECHO 05/2014 EF 50-55% bioprosthetic aortic valve ok very mild gradient RV normal size. Mild HK  ECHO 30-35% 08/2012  ECHO 08/2013 EF 50-55%  Grade II DD  TEE 6/15 EF 40-45%, bioprosthetic aortic valve ok   PFTs 9/14 FEV1 1.1L (34%) FVC 2.57L (57%) FEF 25-75% (17%) DLCO 41%  Labs (9/15): LDL 106 Labs (05/05/14): K 3.4, creatinine 1.34, digoxin 0.4 Labs 06/10/2014: K 5.6 Creatinine 2.7 BNP 215 Labs 06/19/2014: K 4.9 Creatinine 2.28 K and spiro stopped.  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 Drink alcohol occasionally . Denies drug use. Lives alone   FH: Father died MI at 54 Brother MI   ROS: All systems negative except as listed in HPI, PMH and Problem List.  Past Medical History:  Diagnosis Date  . Aortic stenosis    echo 06/19/08 with nomal LV function, moderate concentric LVH moderate aortic stenosis area 0.99 cm squared, peak gradient of 50 and mean of 31  . Aortic stenosis, severe 09/23/2012  . AS (aortic stenosis) with AVR with pericardial tissue valve  09/30/2013  . Atrial fibrillation with RVR, was in atrial fib in 04/2011 09/16/2012   a) s/p MAZE (09/2012)   . Back pain, spinal stenosis 09/30/2013  . Bilateral carotid artery disease ([place])   . CAD (coronary artery disease), single vessel disease 09/23/2012   a) CABG (SVG to PDA and PL) wtih AVR (09/2012)   . Chronic combined systolic and diastolic CHF (congestive heart failure) ([place]) 09/16/2012   a) ECHO (08/2013) EF 50-55%, grade II DD b)  TEE ECHO (09/2013): 40-45%, bioprosthesis present, mild MR  . COPD (chronic obstructive pulmonary disease) ([place])   . Gout   . Gout flare. 09/29/12, Lt knee, improved with colchicine. 09/30/2012  . H/O aortic valve replacement   . Heart murmur   . Hyperkalemia 11/03/2015  . Hyperlipidemia   . Hypertension   . Need for hepatitis C screening test 11/05/2015  . PAD (peripheral artery disease), decreased bil. ABIs 09/23/2012   a) ABIs (11/2013): Right mild arterial insufficiency, L normal; aroto-bifem bypass graft: not well visualized, bilateral SFAs = to greater than 50% in dimaeter reduction   .  Stroke ([place])   . Tobacco use 09/16/2012    Current Outpatient Medications  Medication Sig Dispense Refill  . albuterol (PROVENTIL HFA;VENTOLIN HFA) 108 (90 BASE) MCG/ACT inhaler Inhale 2 puffs into the lungs every 6 (six) hours as needed for wheezing or shortness of breath. 1 Inhaler 2  . amiodarone (PACERONE) 200 MG tablet Take 0.5 tablets (100 mg total) by mouth daily. 45 tablet 3  . apixaban (ELIQUIS) 5 MG TABS tablet Take 1 tablet (5 mg total) by mouth 2 (two) times daily. 60 tablet 11  . atorvastatin (LIPITOR) 80 MG tablet TAKE 1 TABLET EVERY DAY AT 6PM 30 tablet 11  . carvedilol (COREG) 25 MG tablet Take 0.5 tablets (12.5 mg total) by mouth 2 (two) times daily. 30 tablet 8  . diclofenac sodium (VOLTAREN) 1 % GEL Apply 4 g topically 4 (four) times daily.    . Fe Fum-FePoly-Vit C-Vit B3 (INTEGRA) 62.5-62.5-40-3 MG CAPS Take 1 capsule by mouth daily.   4  . folic acid (FOLVITE) 1 MG tablet Take 1 tablet (1 mg total) by mouth daily. 30 tablet 6  . OXYGEN-HELIUM IN Inhale 1-2 L into the lungs continuous. Is in the habit of not using his oxygen when he leaves the house. Uses 1L continuous normally Uses 2L for exertion    . tiZANidine (ZANAFLEX) 4 MG tablet Take 1 tablet by mouth 2 (two) times daily.  5  . torsemide (DEMADEX) 20 MG tablet as needed.     . traMADol (ULTRAM) 50 MG tablet Take 1 tablet (50 mg total) by mouth 4 (four) times daily. 120 tablet 0   No current facility-administered medications for this encounter.    Vitals:   03/07/17 1031  BP: (!) 138/58  Pulse: 62  SpO2: 99%  Weight: 166 lb 8 oz (75.5 kg)    Wt Readings from Last 3 Encounters:  03/07/17 166 lb 8 oz (75.5 kg)  12/04/16 171 lb (77.6 kg)  10/16/16 167 lb 6.4 oz (75.9 kg)     PHYSICAL EXAM: General:  Elderly appearing. Seated in WC. Son present.  HEENT: Normal anicteric Neck: supple. JVP flat. carotids 2+ bilaterally; no bruits. No thyromegaly or nodule noted.  Cor: PMI normal. RRR 2/6 SEM at RUSB. No AI  heard Lungs: Barrel chested. Decreased throughout. No wheeze  Abdomen: soft NT/ND good BS. No HSM  Extremities: no cyanosis,rash, edema. + clubbing  Neuro: alert & orientedx3, cranial nerves grossly intact. Left-sided weakness.   Sinus brady 56 No ST-T wave abnormalities.    ASSESSMENT & PLAN:  1.Acute/Chronic  Biventricular HF (R>L): Suspect mixed ischemic/nonischemic cardiomyopathy,  EF 40-45% on 6/15 TEE. >> Echo 06/10/2015 LVEF 55%, RV mildy HK.  - Echo from 8/18 reviewed with him and his son personally today. LVEF 60-65% RV ok. ? Significant peri-prosthetic AI - Functional status limited due to previous ICH/stroke but overall doing well  -  Volume status looks good.  - No need for diuretics at this point Can use PRN.  - Continue carvedilol 12.5 BID.  - He will be transferring his care to [facility] in [place] at the end of this year as he moves to [place], [place] 2. Severe COPD with cor pulmonale:  - Uses O2 continuously.  Sees Dr. Lake Bells.  No change 3 PAF: s/p MAZE. Failed DC-CV x3 in November 2014 and June 2015. However, he was in NSR in 12/15 at time of hospital admission.   - Remains in NSR. Continue amio 100 mg daily.  He will need yearly eye exam and  PFTs.  - Continue eliquis 5 bid - No bleeding 4. CAD:  - no s/s ischemia - s/p CABG bioprosthetic AVR.  Off ASA with Eliquis 5. H/o Intracerebral hemorrhage:  - Still has left-sided weakness. Continue eliquis per neuro.   6. CKD stage III- IV - Recently stable. Followed by Dr. Joelyn Oms.   7. S/p AVR - Stable. On echo previously there was some some question of a jet around the valve with moderate AI. TEE in 2015 ok. - TTE from 8/18 looks to have worsening AI. Will schedule repeat TEE to evaluate - Continue SBE prophylaxis 8. Carotid stenosis - u/s 7/18. - 40-59% RICA stenosis, based on systolic velocity. - 74-16% LICA stenosis, based on systolic velocity. - Chronic right vertebral artery occlusion. - Significantly elevated  velocities and color doppler turbulence in the right subclavian artery. - Followed by Dr. Sanda Klein, MD 03/07/2017

## 2017-03-07 NOTE — Progress Notes (Signed)
Advanced Heart Failure Clinic Note   Patient ID: [name], male   DOB: October 18, 1950, 66 y.o.   MRN: [id number]   PCP: Dr. Ashby Sparks  EP: Dr [name]  Renal: Dr. Joelyn Sparks  HPI: [name] is 66 y.o. male with past medical history of severe aortic stenosis s/p tissue AV replacement (09/2012) atrial fibrillation s/p MAZE (09/2012)  chronic combined systolic/diastolic HF, RV failure, HTN, PAD s/p L iliac stent in 2010 and aortobifemoral bypass graft, ETOH abuse, severe COPD with cor pulmonale on home oxygen, CAD s/p CABG with AV replacement and back pain from spinal stenosis.   Admitted and treated for acute heart failure complicated by listeria bacteremia and Afib RVR 09/2013. Diuresed with IV lasix transitioned to po lasix.He had TEE DC-CV but went back into afib with controlled rate on amiodarone and eliquis. He was treated with 2 weeks of antibiotics. Discharge weight was 226 pounds.  Amiodarone was stopped as patient did not hold NSR.    Patient was admitted to [facility] in 12/15 with right basal ganglia intracerebral hemorrhage and left hemiparesis.  He had been on Eliquis.  He eventually was sent to inpatient rehab, where due to a combination of ongoing diuretic use and poor po intake, he developed AKI and hyponatremia.  Diuretics and ACEI were held, and creatinine returned to baseline.    He presents today for follow up. Doing very well. Has not needed any diuretics in a long time. Denies any dyspnea. Exercises once a day by walking around the apartment. Limited by leg weakness.  Remains in [facility] for most part but now has lift chair that allows him to get up to walker.  Breathing has been good. No Sparks sores.  Weight stable at 170. Wears O2 at 3L continuously.   Labs 03/17/16 Creatine 2.73 K 3.0 Labs 03/23/16 Creatine 1.95 K 3.4   ECHO 2/17 EF 55-60% RV down  ECHO 05/2014 EF 50-55% bioprosthetic aortic valve ok very mild gradient RV normal size. Mild HK  ECHO 30-35% 08/2012  ECHO 08/2013 EF 50-55%  Grade II DD  TEE 6/15 EF 40-45%, bioprosthetic aortic valve ok   PFTs 9/14 FEV1 1.1L (34%) FVC 2.57L (57%) FEF 25-75% (17%) DLCO 41%  Labs (9/15): LDL 106 Labs (05/05/14): K 3.4, creatinine 1.34, digoxin 0.4 Labs 06/10/2014: K 5.6 Creatinine 2.7 BNP 215 Labs 06/19/2014: K 4.9 Creatinine 2.28 K and spiro stopped.  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 Drink alcohol occasionally . Denies drug use. Lives alone   FH: Father died MI at 62 Brother MI   ROS: All systems negative except as listed in HPI, PMH and Problem List.  Past Medical History:  Diagnosis Date  . Aortic stenosis    echo 06/19/08 with nomal LV function, moderate concentric LVH moderate aortic stenosis area 0.99 cm squared, peak gradient of 50 and mean of 31  . Aortic stenosis, severe 09/23/2012  . AS (aortic stenosis) with AVR with pericardial tissue valve  09/30/2013  . Atrial fibrillation with RVR, was in atrial fib in 04/2011 09/16/2012   a) s/p MAZE (09/2012)   . Back pain, spinal stenosis 09/30/2013  . Bilateral carotid artery disease ([place])   . CAD (coronary artery disease), single vessel disease 09/23/2012   a) CABG (SVG to PDA and PL) wtih AVR (09/2012)   . Chronic combined systolic and diastolic CHF (congestive heart failure) ([place]) 09/16/2012   a) ECHO (08/2013) EF 50-55%, grade II DD b)  TEE ECHO (09/2013): 40-45%, bioprosthesis present, mild MR  . COPD (chronic obstructive pulmonary disease) ([place])   . Gout   . Gout flare. 09/29/12, Lt knee, improved with colchicine. 09/30/2012  . H/O aortic valve replacement   . Heart murmur   . Hyperkalemia 11/03/2015  . Hyperlipidemia   . Hypertension   . Need for hepatitis C screening test 11/05/2015  . PAD (peripheral artery disease), decreased bil. ABIs 09/23/2012   a) ABIs (11/2013): Right mild arterial insufficiency, L normal; aroto-bifem bypass graft: not well visualized, bilateral SFAs = to greater than 50% in dimaeter reduction   .  Stroke ([place])   . Tobacco use 09/16/2012    Current Outpatient Medications  Medication Sig Dispense Refill  . albuterol (PROVENTIL HFA;VENTOLIN HFA) 108 (90 BASE) MCG/ACT inhaler Inhale 2 puffs into the lungs every 6 (six) hours as needed for wheezing or shortness of breath. 1 Inhaler 2  . amiodarone (PACERONE) 200 MG tablet Take 0.5 tablets (100 mg total) by mouth daily. 45 tablet 3  . apixaban (ELIQUIS) 5 MG TABS tablet Take 1 tablet (5 mg total) by mouth 2 (two) times daily. 60 tablet 11  . atorvastatin (LIPITOR) 80 MG tablet TAKE 1 TABLET EVERY DAY AT 6PM 30 tablet 11  . carvedilol (COREG) 25 MG tablet Take 0.5 tablets (12.5 mg total) by mouth 2 (two) times daily. 30 tablet 8  . diclofenac sodium (VOLTAREN) 1 % GEL Apply 4 g topically 4 (four) times daily.    . Fe Fum-FePoly-Vit C-Vit B3 (INTEGRA) 62.5-62.5-40-3 MG CAPS Take 1 capsule by mouth daily.   4  . folic acid (FOLVITE) 1 MG tablet Take 1 tablet (1 mg total) by mouth daily. 30 tablet 6  . OXYGEN-HELIUM IN Inhale 1-2 L into the lungs continuous. Is in the habit of not using his oxygen when he leaves the house. Uses 1L continuous normally Uses 2L for exertion    . tiZANidine (ZANAFLEX) 4 MG tablet Take 1 tablet by mouth 2 (two) times daily.  5  . torsemide (DEMADEX) 20 MG tablet as needed.     . traMADol (ULTRAM) 50 MG tablet Take 1 tablet (50 mg total) by mouth 4 (four) times daily. 120 tablet 0   No current facility-administered medications for this encounter.    Vitals:   03/07/17 1031  BP: (!) 138/58  Pulse: 62  SpO2: 99%  Weight: 166 lb 8 oz (75.5 kg)    Wt Readings from Last 3 Encounters:  03/07/17 166 lb 8 oz (75.5 kg)  12/04/16 171 lb (77.6 kg)  10/16/16 167 lb 6.4 oz (75.9 kg)     PHYSICAL EXAM: General:  Elderly appearing. Seated in WC. Son present.  HEENT: Normal anicteric Neck: supple. JVP flat. carotids 2+ bilaterally; no bruits. No thyromegaly or nodule noted.  Cor: PMI normal. RRR 2/6 SEM at RUSB. No AI  heard Lungs: Barrel chested. Decreased throughout. No wheeze  Abdomen: soft NT/ND good BS. No HSM  Extremities: no cyanosis,rash, edema. + clubbing  Neuro: alert & orientedx3, cranial nerves grossly intact. Left-sided weakness.   Sinus brady 56 No ST-T wave abnormalities.    ASSESSMENT & PLAN:  1.Acute/Chronic  Biventricular HF (R>L): Suspect mixed ischemic/nonischemic cardiomyopathy,  EF 40-45% on 6/15 TEE. >> Echo 06/10/2015 LVEF 55%, RV mildy HK.  - Echo from 8/18 reviewed with him and his son personally today. LVEF 60-65% RV ok. ? Significant peri-prosthetic AI - Functional status limited due to previous ICH/stroke but overall doing well  -  Volume status looks good.  - No need for diuretics at this point Can use PRN.  - Continue carvedilol 12.5 BID.  - He will be transferring his care to [facility] in [place] at the end of this year as he moves to [place], [place] 2. Severe COPD with cor pulmonale:  - Uses O2 continuously.  Sees Dr. Lake Bells.  No change 3 PAF: s/p MAZE. Failed DC-CV x3 in November 2014 and June 2015. However, he was in NSR in 12/15 at time of hospital admission.   - Remains in NSR. Continue amio 100 mg daily.  He will need yearly eye exam and  PFTs.  - Continue eliquis 5 bid - No bleeding 4. CAD:  - no s/s ischemia - s/p CABG bioprosthetic AVR.  Off ASA with Eliquis 5. H/o Intracerebral hemorrhage:  - Still has left-sided weakness. Continue eliquis per neuro.   6. CKD stage III- IV - Recently stable. Followed by Dr. Joelyn Sparks.   7. S/p AVR - Stable. On echo previously there was some some question of a jet around the valve with moderate AI. TEE in 2015 ok. - TTE from 8/18 looks to have worsening AI. Will schedule repeat TEE to evaluate - Continue SBE prophylaxis 8. Carotid stenosis - u/s 7/18. - 40-59% RICA stenosis, based on systolic velocity. - 81-85% LICA stenosis, based on systolic velocity. - Chronic right vertebral artery occlusion. - Significantly elevated  velocities and color doppler turbulence in the right subclavian artery. - Followed by Dr. Sanda Klein, MD 03/07/2017

## 2017-03-09 ENCOUNTER — Other Ambulatory Visit (HOSPITAL_COMMUNITY): Payer: Self-pay

## 2017-03-09 DIAGNOSIS — I351 Nonrheumatic aortic (valve) insufficiency: Secondary | ICD-10-CM

## 2017-03-13 ENCOUNTER — Encounter (HOSPITAL_COMMUNITY)
Admission: RE | Disposition: A | Discharge status: Home / Self Care | Payer: Self-pay | Source: Ambulatory Visit | Attending: Internal Medicine

## 2017-03-13 ENCOUNTER — Ambulatory Visit (HOSPITAL_COMMUNITY)
Admission: RE | Admit: 2017-03-13 | Discharge: 2017-03-13 | Disposition: A | Discharge status: Home / Self Care | Payer: Medicare Other | Source: Ambulatory Visit | Attending: Internal Medicine | Admitting: Internal Medicine

## 2017-03-13 ENCOUNTER — Encounter (HOSPITAL_COMMUNITY): Payer: Self-pay | Admitting: *Deleted

## 2017-03-13 ENCOUNTER — Ambulatory Visit (HOSPITAL_BASED_OUTPATIENT_CLINIC_OR_DEPARTMENT_OTHER): Payer: Medicare Other

## 2017-03-13 ENCOUNTER — Other Ambulatory Visit (HOSPITAL_COMMUNITY): Payer: Self-pay | Admitting: *Deleted

## 2017-03-13 DIAGNOSIS — M109 Gout, unspecified: Secondary | ICD-10-CM | POA: Insufficient documentation

## 2017-03-13 DIAGNOSIS — Z953 Presence of xenogenic heart valve: Secondary | ICD-10-CM | POA: Insufficient documentation

## 2017-03-13 DIAGNOSIS — I480 Paroxysmal atrial fibrillation: Secondary | ICD-10-CM | POA: Diagnosis not present

## 2017-03-13 DIAGNOSIS — I130 Hypertensive heart and chronic kidney disease with heart failure and stage 1 through stage 4 chronic kidney disease, or unspecified chronic kidney disease: Secondary | ICD-10-CM | POA: Diagnosis not present

## 2017-03-13 DIAGNOSIS — I739 Peripheral vascular disease, unspecified: Secondary | ICD-10-CM | POA: Insufficient documentation

## 2017-03-13 DIAGNOSIS — Z951 Presence of aortocoronary bypass graft: Secondary | ICD-10-CM | POA: Diagnosis not present

## 2017-03-13 DIAGNOSIS — J449 Chronic obstructive pulmonary disease, unspecified: Secondary | ICD-10-CM | POA: Diagnosis not present

## 2017-03-13 DIAGNOSIS — Z9981 Dependence on supplemental oxygen: Secondary | ICD-10-CM | POA: Insufficient documentation

## 2017-03-13 DIAGNOSIS — Z79899 Other long term (current) drug therapy: Secondary | ICD-10-CM | POA: Diagnosis not present

## 2017-03-13 DIAGNOSIS — I351 Nonrheumatic aortic (valve) insufficiency: Secondary | ICD-10-CM | POA: Diagnosis present

## 2017-03-13 DIAGNOSIS — I429 Cardiomyopathy, unspecified: Secondary | ICD-10-CM | POA: Diagnosis not present

## 2017-03-13 DIAGNOSIS — I6501 Occlusion and stenosis of right vertebral artery: Secondary | ICD-10-CM | POA: Insufficient documentation

## 2017-03-13 DIAGNOSIS — I2781 Cor pulmonale (chronic): Secondary | ICD-10-CM | POA: Diagnosis not present

## 2017-03-13 DIAGNOSIS — I5042 Chronic combined systolic (congestive) and diastolic (congestive) heart failure: Secondary | ICD-10-CM | POA: Diagnosis not present

## 2017-03-13 DIAGNOSIS — Z95820 Peripheral vascular angioplasty status with implants and grafts: Secondary | ICD-10-CM | POA: Insufficient documentation

## 2017-03-13 DIAGNOSIS — I2510 Atherosclerotic heart disease of native coronary artery without angina pectoris: Secondary | ICD-10-CM | POA: Diagnosis not present

## 2017-03-13 DIAGNOSIS — Z7901 Long term (current) use of anticoagulants: Secondary | ICD-10-CM | POA: Insufficient documentation

## 2017-03-13 DIAGNOSIS — Z87891 Personal history of nicotine dependence: Secondary | ICD-10-CM | POA: Insufficient documentation

## 2017-03-13 DIAGNOSIS — Z8673 Personal history of transient ischemic attack (TIA), and cerebral infarction without residual deficits: Secondary | ICD-10-CM | POA: Insufficient documentation

## 2017-03-13 DIAGNOSIS — E785 Hyperlipidemia, unspecified: Secondary | ICD-10-CM | POA: Insufficient documentation

## 2017-03-13 DIAGNOSIS — M4800 Spinal stenosis, site unspecified: Secondary | ICD-10-CM | POA: Insufficient documentation

## 2017-03-13 DIAGNOSIS — E875 Hyperkalemia: Secondary | ICD-10-CM | POA: Insufficient documentation

## 2017-03-13 DIAGNOSIS — N184 Chronic kidney disease, stage 4 (severe): Secondary | ICD-10-CM | POA: Insufficient documentation

## 2017-03-13 DIAGNOSIS — I083 Combined rheumatic disorders of mitral, aortic and tricuspid valves: Secondary | ICD-10-CM | POA: Insufficient documentation

## 2017-03-13 DIAGNOSIS — I340 Nonrheumatic mitral (valve) insufficiency: Secondary | ICD-10-CM

## 2017-03-13 HISTORY — PX: TEE WITHOUT CARDIOVERSION: SHX5443

## 2017-03-13 SURGERY — ECHOCARDIOGRAM, TRANSESOPHAGEAL
Anesthesia: Moderate Sedation

## 2017-03-13 MED ORDER — FENTANYL CITRATE (PF) 100 MCG/2ML IJ SOLN
INTRAMUSCULAR | Status: AC
  Filled 2017-03-13: qty 2

## 2017-03-13 MED ORDER — LIDOCAINE VISCOUS 2 % MT SOLN
OROMUCOSAL | Status: AC
  Filled 2017-03-13: qty 15

## 2017-03-13 MED ORDER — MIDAZOLAM HCL 10 MG/2ML IJ SOLN
INTRAMUSCULAR | Status: DC | PRN
  Administered 2017-03-13: 1 mg via INTRAVENOUS
  Administered 2017-03-13: 2 mg via INTRAVENOUS

## 2017-03-13 MED ORDER — FENTANYL CITRATE (PF) 100 MCG/2ML IJ SOLN
INTRAMUSCULAR | Status: DC | PRN
  Administered 2017-03-13: 50 ug via INTRAVENOUS

## 2017-03-13 MED ORDER — MIDAZOLAM HCL 5 MG/ML IJ SOLN
INTRAMUSCULAR | Status: AC
  Filled 2017-03-13: qty 2

## 2017-03-13 MED ORDER — BUTAMBEN-TETRACAINE-BENZOCAINE 2-2-14 % EX AERO
INHALATION_SPRAY | CUTANEOUS | Status: DC | PRN
  Administered 2017-03-13: 2 via TOPICAL

## 2017-03-13 NOTE — Progress Notes (Signed)
  Echocardiogram Echocardiogram Transesophageal has been performed.  [name] 03/13/2017, 3:16 PM

## 2017-03-13 NOTE — Discharge Instructions (Signed)

## 2017-03-13 NOTE — Interval H&P Note (Signed)
History and Physical Interval Note:  03/13/2017 12:59 PM  [name]  has presented today for surgery, with the diagnosis of AORTIC INSUFFUCIENCY  The various methods of treatment have been discussed with the patient and family. After consideration of risks, benefits and other options for treatment, the patient has consented to  Procedure(s): TRANSESOPHAGEAL ECHOCARDIOGRAM (TEE) (N/A) as a surgical intervention .  The patient's history has been reviewed, patient examined, no change in status, stable for surgery.  I have reviewed the patient's chart and labs.  Questions were answered to the patient's satisfaction.     Shamecka Hocutt

## 2017-03-13 NOTE — CV Procedure (Signed)
    TRANSESOPHAGEAL ECHOCARDIOGRAM   NAME:  [name]   MRN: [id number] DOB:  [birth year]   ADMIT DATE: 03/13/2017  INDICATIONS:  Aortic regurgitation   PROCEDURE:   Informed consent was obtained prior to the procedure. The risks, benefits and alternatives for the procedure were discussed and the patient comprehended these risks.  Risks include, but are not limited to, cough, sore throat, vomiting, nausea, somnolence, esophageal and stomach trauma or perforation, bleeding, low blood pressure, aspiration, pneumonia, infection, trauma to the teeth and death.    After a procedural time-out, the patient was given 3 mg versed and 50 mcg fentanyl for moderate sedation.  The oropharynx was anesthetized with cetacaine spray.  The transesophageal probe was inserted in the esophagus and stomach without difficulty and multiple views were obtained.    COMPLICATIONS:    There were no immediate complications.  FINDINGS:  LEFT VENTRICLE: EF = 55-60%. No regional wall motion abnormalities.  RIGHT VENTRICLE: Normal size and function.   LEFT ATRIUM: Mildly dilated  LEFT ATRIAL APPENDAGE: Not visualized  RIGHT ATRIUM: Normal  AORTIC VALVE:  Normally functioning bioporsthetic valve. Very mild perivalvular leak  MITRAL VALVE:    Normal. Very mild prolapse. Mild MR  TRICUSPID VALVE: Normal. Mild TR  PULMONIC VALVE: Grossly normal. No signiicant PI.   INTERATRIAL SEPTUM: No PFO or ASD.  PERICARDIUM: No effusion  DESCENDING AORTA: Mild plaque   CONCLUSION:  Very mild perivalvular AI  Devonn Giampietro,MD 3:04 PM

## 2017-03-14 ENCOUNTER — Encounter (HOSPITAL_COMMUNITY): Payer: Self-pay | Admitting: Internal Medicine

## 2017-03-26 DIAGNOSIS — H40033 Anatomical narrow angle, bilateral: Secondary | ICD-10-CM | POA: Diagnosis not present

## 2017-03-26 DIAGNOSIS — H1013 Acute atopic conjunctivitis, bilateral: Secondary | ICD-10-CM | POA: Diagnosis not present

## 2017-03-29 DIAGNOSIS — N183 Chronic kidney disease, stage 3 (moderate): Secondary | ICD-10-CM | POA: Diagnosis not present

## 2017-04-06 DIAGNOSIS — E875 Hyperkalemia: Secondary | ICD-10-CM | POA: Diagnosis not present

## 2017-04-06 DIAGNOSIS — N183 Chronic kidney disease, stage 3 (moderate): Secondary | ICD-10-CM | POA: Diagnosis not present

## 2017-04-06 DIAGNOSIS — I129 Hypertensive chronic kidney disease with stage 1 through stage 4 chronic kidney disease, or unspecified chronic kidney disease: Secondary | ICD-10-CM | POA: Diagnosis not present

## 2017-04-11 DIAGNOSIS — E875 Hyperkalemia: Secondary | ICD-10-CM | POA: Diagnosis not present

## 2017-04-13 ENCOUNTER — Other Ambulatory Visit: Payer: Self-pay | Admitting: Cardiovascular Disease

## 2017-05-11 DIAGNOSIS — E875 Hyperkalemia: Secondary | ICD-10-CM | POA: Diagnosis not present

## 2017-05-21 ENCOUNTER — Telehealth: Payer: Self-pay | Admitting: *Deleted

## 2017-05-21 NOTE — Telephone Encounter (Signed)
Pt will let us know when he gets a PCP.

## 2017-05-21 NOTE — Telephone Encounter (Signed)
No, will not work He needs to get established with local PCP and gets labs checked through local PCP I can prescribe iron if needed

## 2017-05-21 NOTE — Telephone Encounter (Signed)
Pt has lab only appt on 2/8. Has relocated to [place], [place] and requesting to have labs drawn at [facility] in [place], [place]. Will continue to come see Dr Alvy Bimler for office visit and treatments. Address and phone number for [facility] obtained.

## 2017-05-28 ENCOUNTER — Other Ambulatory Visit (HOSPITAL_COMMUNITY): Payer: Self-pay | Admitting: *Deleted

## 2017-05-28 MED ORDER — APIXABAN 5 MG PO TABS: 5.0000 mg | ORAL_TABLET | Freq: Two times a day (BID) | ORAL | 3 refills | Status: AC

## 2017-06-01 ENCOUNTER — Other Ambulatory Visit: Payer: Self-pay

## 2017-06-07 DIAGNOSIS — E875 Hyperkalemia: Secondary | ICD-10-CM | POA: Diagnosis not present

## 2017-07-16 ENCOUNTER — Other Ambulatory Visit (HOSPITAL_COMMUNITY): Payer: Self-pay | Admitting: *Deleted

## 2017-07-16 MED ORDER — ATORVASTATIN CALCIUM 80 MG PO TABS: ORAL_TABLET | ORAL | 3 refills | Status: DC

## 2017-08-06 DIAGNOSIS — I6930 Unspecified sequelae of cerebral infarction: Secondary | ICD-10-CM | POA: Diagnosis not present

## 2017-08-06 DIAGNOSIS — G8194 Hemiplegia, unspecified affecting left nondominant side: Secondary | ICD-10-CM | POA: Diagnosis not present

## 2017-08-06 DIAGNOSIS — I359 Nonrheumatic aortic valve disorder, unspecified: Secondary | ICD-10-CM | POA: Diagnosis not present

## 2017-08-06 DIAGNOSIS — Z8639 Personal history of other endocrine, nutritional and metabolic disease: Secondary | ICD-10-CM | POA: Diagnosis not present

## 2017-10-08 ENCOUNTER — Other Ambulatory Visit (HOSPITAL_COMMUNITY): Payer: Self-pay | Admitting: Internal Medicine

## 2017-10-15 DIAGNOSIS — E875 Hyperkalemia: Secondary | ICD-10-CM | POA: Diagnosis not present

## 2017-10-15 DIAGNOSIS — I129 Hypertensive chronic kidney disease with stage 1 through stage 4 chronic kidney disease, or unspecified chronic kidney disease: Secondary | ICD-10-CM | POA: Diagnosis not present

## 2017-10-15 DIAGNOSIS — D509 Iron deficiency anemia, unspecified: Secondary | ICD-10-CM | POA: Diagnosis not present

## 2017-10-15 DIAGNOSIS — N183 Chronic kidney disease, stage 3 (moderate): Secondary | ICD-10-CM | POA: Diagnosis not present

## 2017-10-16 DIAGNOSIS — I110 Hypertensive heart disease with heart failure: Secondary | ICD-10-CM | POA: Diagnosis not present

## 2017-10-16 DIAGNOSIS — R001 Bradycardia, unspecified: Secondary | ICD-10-CM | POA: Diagnosis not present

## 2017-10-16 DIAGNOSIS — Z87891 Personal history of nicotine dependence: Secondary | ICD-10-CM | POA: Diagnosis not present

## 2017-10-16 DIAGNOSIS — I5042 Chronic combined systolic (congestive) and diastolic (congestive) heart failure: Secondary | ICD-10-CM | POA: Diagnosis not present

## 2017-10-16 DIAGNOSIS — I739 Peripheral vascular disease, unspecified: Secondary | ICD-10-CM | POA: Diagnosis not present

## 2017-10-16 DIAGNOSIS — I480 Paroxysmal atrial fibrillation: Secondary | ICD-10-CM | POA: Diagnosis not present

## 2017-10-16 DIAGNOSIS — I2510 Atherosclerotic heart disease of native coronary artery without angina pectoris: Secondary | ICD-10-CM | POA: Diagnosis not present

## 2017-10-16 DIAGNOSIS — E782 Mixed hyperlipidemia: Secondary | ICD-10-CM | POA: Diagnosis not present

## 2017-10-16 DIAGNOSIS — I359 Nonrheumatic aortic valve disorder, unspecified: Secondary | ICD-10-CM | POA: Diagnosis not present

## 2017-10-17 ENCOUNTER — Other Ambulatory Visit (HOSPITAL_COMMUNITY): Payer: Self-pay | Admitting: Internal Medicine

## 2017-10-30 DIAGNOSIS — N189 Chronic kidney disease, unspecified: Secondary | ICD-10-CM | POA: Diagnosis not present

## 2017-10-30 DIAGNOSIS — N183 Chronic kidney disease, stage 3 (moderate): Secondary | ICD-10-CM | POA: Diagnosis not present

## 2017-10-30 DIAGNOSIS — K8020 Calculus of gallbladder without cholecystitis without obstruction: Secondary | ICD-10-CM | POA: Diagnosis not present

## 2017-11-05 DIAGNOSIS — I69398 Other sequelae of cerebral infarction: Secondary | ICD-10-CM | POA: Diagnosis not present

## 2017-11-09 DIAGNOSIS — D509 Iron deficiency anemia, unspecified: Secondary | ICD-10-CM | POA: Diagnosis not present

## 2017-11-09 DIAGNOSIS — N183 Chronic kidney disease, stage 3 (moderate): Secondary | ICD-10-CM | POA: Diagnosis not present

## 2017-11-20 DIAGNOSIS — I129 Hypertensive chronic kidney disease with stage 1 through stage 4 chronic kidney disease, or unspecified chronic kidney disease: Secondary | ICD-10-CM | POA: Diagnosis not present

## 2017-11-20 DIAGNOSIS — D509 Iron deficiency anemia, unspecified: Secondary | ICD-10-CM | POA: Diagnosis not present

## 2017-11-20 DIAGNOSIS — E875 Hyperkalemia: Secondary | ICD-10-CM | POA: Diagnosis not present

## 2017-11-20 DIAGNOSIS — N183 Chronic kidney disease, stage 3 (moderate): Secondary | ICD-10-CM | POA: Diagnosis not present

## 2017-12-07 DIAGNOSIS — N183 Chronic kidney disease, stage 3 (moderate): Secondary | ICD-10-CM | POA: Diagnosis not present

## 2017-12-07 DIAGNOSIS — D509 Iron deficiency anemia, unspecified: Secondary | ICD-10-CM | POA: Diagnosis not present

## 2017-12-18 DIAGNOSIS — N183 Chronic kidney disease, stage 3 (moderate): Secondary | ICD-10-CM | POA: Diagnosis not present

## 2017-12-18 DIAGNOSIS — D509 Iron deficiency anemia, unspecified: Secondary | ICD-10-CM | POA: Diagnosis not present

## 2017-12-18 DIAGNOSIS — I129 Hypertensive chronic kidney disease with stage 1 through stage 4 chronic kidney disease, or unspecified chronic kidney disease: Secondary | ICD-10-CM | POA: Diagnosis not present

## 2017-12-25 DIAGNOSIS — J449 Chronic obstructive pulmonary disease, unspecified: Secondary | ICD-10-CM | POA: Diagnosis not present

## 2017-12-25 DIAGNOSIS — N183 Chronic kidney disease, stage 3 (moderate): Secondary | ICD-10-CM | POA: Diagnosis not present

## 2017-12-25 DIAGNOSIS — I130 Hypertensive heart and chronic kidney disease with heart failure and stage 1 through stage 4 chronic kidney disease, or unspecified chronic kidney disease: Secondary | ICD-10-CM | POA: Diagnosis not present

## 2017-12-25 DIAGNOSIS — K644 Residual hemorrhoidal skin tags: Secondary | ICD-10-CM | POA: Diagnosis not present

## 2017-12-25 DIAGNOSIS — I4891 Unspecified atrial fibrillation: Secondary | ICD-10-CM | POA: Diagnosis not present

## 2017-12-25 DIAGNOSIS — K635 Polyp of colon: Secondary | ICD-10-CM | POA: Diagnosis not present

## 2017-12-25 DIAGNOSIS — I5042 Chronic combined systolic (congestive) and diastolic (congestive) heart failure: Secondary | ICD-10-CM | POA: Diagnosis not present

## 2017-12-25 DIAGNOSIS — I509 Heart failure, unspecified: Secondary | ICD-10-CM | POA: Diagnosis not present

## 2017-12-25 DIAGNOSIS — I2510 Atherosclerotic heart disease of native coronary artery without angina pectoris: Secondary | ICD-10-CM | POA: Diagnosis not present

## 2017-12-25 DIAGNOSIS — I10 Essential (primary) hypertension: Secondary | ICD-10-CM | POA: Diagnosis not present

## 2017-12-25 DIAGNOSIS — E785 Hyperlipidemia, unspecified: Secondary | ICD-10-CM | POA: Diagnosis not present

## 2017-12-25 DIAGNOSIS — Z1211 Encounter for screening for malignant neoplasm of colon: Secondary | ICD-10-CM | POA: Diagnosis not present

## 2017-12-25 DIAGNOSIS — I739 Peripheral vascular disease, unspecified: Secondary | ICD-10-CM | POA: Diagnosis not present

## 2017-12-25 DIAGNOSIS — K5730 Diverticulosis of large intestine without perforation or abscess without bleeding: Secondary | ICD-10-CM | POA: Diagnosis not present

## 2017-12-28 DIAGNOSIS — D509 Iron deficiency anemia, unspecified: Secondary | ICD-10-CM | POA: Diagnosis not present

## 2017-12-28 DIAGNOSIS — N183 Chronic kidney disease, stage 3 (moderate): Secondary | ICD-10-CM | POA: Diagnosis not present

## 2018-01-05 ENCOUNTER — Other Ambulatory Visit (HOSPITAL_COMMUNITY): Payer: Self-pay | Admitting: Internal Medicine

## 2018-01-14 DIAGNOSIS — N183 Chronic kidney disease, stage 3 (moderate): Secondary | ICD-10-CM | POA: Diagnosis not present

## 2018-01-14 DIAGNOSIS — D509 Iron deficiency anemia, unspecified: Secondary | ICD-10-CM | POA: Diagnosis not present

## 2018-02-05 DIAGNOSIS — R635 Abnormal weight gain: Secondary | ICD-10-CM | POA: Diagnosis not present

## 2018-02-05 DIAGNOSIS — R0602 Shortness of breath: Secondary | ICD-10-CM | POA: Diagnosis not present

## 2018-02-05 DIAGNOSIS — Z8639 Personal history of other endocrine, nutritional and metabolic disease: Secondary | ICD-10-CM | POA: Diagnosis not present

## 2018-02-05 DIAGNOSIS — Z23 Encounter for immunization: Secondary | ICD-10-CM | POA: Diagnosis not present

## 2018-02-08 DIAGNOSIS — I4891 Unspecified atrial fibrillation: Secondary | ICD-10-CM | POA: Diagnosis not present

## 2018-02-08 DIAGNOSIS — I2510 Atherosclerotic heart disease of native coronary artery without angina pectoris: Secondary | ICD-10-CM | POA: Diagnosis not present

## 2018-02-08 DIAGNOSIS — G8911 Acute pain due to trauma: Secondary | ICD-10-CM | POA: Diagnosis not present

## 2018-02-08 DIAGNOSIS — Z7901 Long term (current) use of anticoagulants: Secondary | ICD-10-CM | POA: Diagnosis not present

## 2018-02-08 DIAGNOSIS — Z79899 Other long term (current) drug therapy: Secondary | ICD-10-CM | POA: Diagnosis not present

## 2018-02-08 DIAGNOSIS — S81812A Laceration without foreign body, left lower leg, initial encounter: Secondary | ICD-10-CM | POA: Diagnosis not present

## 2018-02-13 DIAGNOSIS — I4891 Unspecified atrial fibrillation: Secondary | ICD-10-CM | POA: Diagnosis not present

## 2018-02-13 DIAGNOSIS — L03116 Cellulitis of left lower limb: Secondary | ICD-10-CM | POA: Diagnosis not present

## 2018-02-13 DIAGNOSIS — I2510 Atherosclerotic heart disease of native coronary artery without angina pectoris: Secondary | ICD-10-CM | POA: Diagnosis not present

## 2018-02-13 DIAGNOSIS — Z79899 Other long term (current) drug therapy: Secondary | ICD-10-CM | POA: Diagnosis not present

## 2018-02-13 DIAGNOSIS — M7989 Other specified soft tissue disorders: Secondary | ICD-10-CM | POA: Diagnosis not present

## 2018-02-13 DIAGNOSIS — R000 Tachycardia, unspecified: Secondary | ICD-10-CM | POA: Diagnosis not present

## 2018-02-13 DIAGNOSIS — I110 Hypertensive heart disease with heart failure: Secondary | ICD-10-CM | POA: Diagnosis not present

## 2018-02-13 DIAGNOSIS — Z95820 Peripheral vascular angioplasty status with implants and grafts: Secondary | ICD-10-CM | POA: Diagnosis not present

## 2018-02-13 DIAGNOSIS — R0600 Dyspnea, unspecified: Secondary | ICD-10-CM | POA: Diagnosis not present

## 2018-02-13 DIAGNOSIS — R600 Localized edema: Secondary | ICD-10-CM | POA: Diagnosis not present

## 2018-02-13 DIAGNOSIS — I509 Heart failure, unspecified: Secondary | ICD-10-CM | POA: Diagnosis not present

## 2018-02-13 DIAGNOSIS — R0602 Shortness of breath: Secondary | ICD-10-CM | POA: Diagnosis not present

## 2018-02-14 DIAGNOSIS — N183 Chronic kidney disease, stage 3 (moderate): Secondary | ICD-10-CM | POA: Diagnosis not present

## 2018-02-14 DIAGNOSIS — I5042 Chronic combined systolic (congestive) and diastolic (congestive) heart failure: Secondary | ICD-10-CM | POA: Diagnosis not present

## 2018-02-14 DIAGNOSIS — I739 Peripheral vascular disease, unspecified: Secondary | ICD-10-CM | POA: Diagnosis not present

## 2018-02-14 DIAGNOSIS — J439 Emphysema, unspecified: Secondary | ICD-10-CM | POA: Diagnosis not present

## 2018-02-14 DIAGNOSIS — I130 Hypertensive heart and chronic kidney disease with heart failure and stage 1 through stage 4 chronic kidney disease, or unspecified chronic kidney disease: Secondary | ICD-10-CM | POA: Diagnosis not present

## 2018-02-14 DIAGNOSIS — Z9181 History of falling: Secondary | ICD-10-CM | POA: Diagnosis not present

## 2018-02-14 DIAGNOSIS — Z87891 Personal history of nicotine dependence: Secondary | ICD-10-CM | POA: Diagnosis not present

## 2018-02-14 DIAGNOSIS — D631 Anemia in chronic kidney disease: Secondary | ICD-10-CM | POA: Diagnosis not present

## 2018-02-14 DIAGNOSIS — Z79891 Long term (current) use of opiate analgesic: Secondary | ICD-10-CM | POA: Diagnosis not present

## 2018-02-14 DIAGNOSIS — S81812D Laceration without foreign body, left lower leg, subsequent encounter: Secondary | ICD-10-CM | POA: Diagnosis not present

## 2018-02-14 DIAGNOSIS — F329 Major depressive disorder, single episode, unspecified: Secondary | ICD-10-CM | POA: Diagnosis not present

## 2018-02-14 DIAGNOSIS — Z7901 Long term (current) use of anticoagulants: Secondary | ICD-10-CM | POA: Diagnosis not present

## 2018-02-14 DIAGNOSIS — I482 Chronic atrial fibrillation, unspecified: Secondary | ICD-10-CM | POA: Diagnosis not present

## 2018-02-14 DIAGNOSIS — I2510 Atherosclerotic heart disease of native coronary artery without angina pectoris: Secondary | ICD-10-CM | POA: Diagnosis not present

## 2018-02-14 DIAGNOSIS — I69354 Hemiplegia and hemiparesis following cerebral infarction affecting left non-dominant side: Secondary | ICD-10-CM | POA: Diagnosis not present

## 2018-02-14 DIAGNOSIS — Z9981 Dependence on supplemental oxygen: Secondary | ICD-10-CM | POA: Diagnosis not present

## 2018-02-15 DIAGNOSIS — N183 Chronic kidney disease, stage 3 (moderate): Secondary | ICD-10-CM | POA: Diagnosis not present

## 2018-02-15 DIAGNOSIS — S81809A Unspecified open wound, unspecified lower leg, initial encounter: Secondary | ICD-10-CM | POA: Diagnosis not present

## 2018-02-15 DIAGNOSIS — I130 Hypertensive heart and chronic kidney disease with heart failure and stage 1 through stage 4 chronic kidney disease, or unspecified chronic kidney disease: Secondary | ICD-10-CM | POA: Diagnosis not present

## 2018-02-15 DIAGNOSIS — D631 Anemia in chronic kidney disease: Secondary | ICD-10-CM | POA: Diagnosis not present

## 2018-02-15 DIAGNOSIS — S81812D Laceration without foreign body, left lower leg, subsequent encounter: Secondary | ICD-10-CM | POA: Diagnosis not present

## 2018-02-15 DIAGNOSIS — I5042 Chronic combined systolic (congestive) and diastolic (congestive) heart failure: Secondary | ICD-10-CM | POA: Diagnosis not present

## 2018-02-15 DIAGNOSIS — I739 Peripheral vascular disease, unspecified: Secondary | ICD-10-CM | POA: Diagnosis not present

## 2018-02-18 DIAGNOSIS — I5042 Chronic combined systolic (congestive) and diastolic (congestive) heart failure: Secondary | ICD-10-CM | POA: Diagnosis not present

## 2018-02-18 DIAGNOSIS — I130 Hypertensive heart and chronic kidney disease with heart failure and stage 1 through stage 4 chronic kidney disease, or unspecified chronic kidney disease: Secondary | ICD-10-CM | POA: Diagnosis not present

## 2018-02-18 DIAGNOSIS — D631 Anemia in chronic kidney disease: Secondary | ICD-10-CM | POA: Diagnosis not present

## 2018-02-18 DIAGNOSIS — N183 Chronic kidney disease, stage 3 (moderate): Secondary | ICD-10-CM | POA: Diagnosis not present

## 2018-02-18 DIAGNOSIS — I739 Peripheral vascular disease, unspecified: Secondary | ICD-10-CM | POA: Diagnosis not present

## 2018-02-18 DIAGNOSIS — S81812D Laceration without foreign body, left lower leg, subsequent encounter: Secondary | ICD-10-CM | POA: Diagnosis not present

## 2018-02-19 DIAGNOSIS — I5042 Chronic combined systolic (congestive) and diastolic (congestive) heart failure: Secondary | ICD-10-CM | POA: Diagnosis not present

## 2018-02-19 DIAGNOSIS — R600 Localized edema: Secondary | ICD-10-CM | POA: Diagnosis not present

## 2018-02-19 DIAGNOSIS — N183 Chronic kidney disease, stage 3 (moderate): Secondary | ICD-10-CM | POA: Diagnosis not present

## 2018-02-19 DIAGNOSIS — E875 Hyperkalemia: Secondary | ICD-10-CM | POA: Diagnosis not present

## 2018-02-19 DIAGNOSIS — I739 Peripheral vascular disease, unspecified: Secondary | ICD-10-CM | POA: Diagnosis not present

## 2018-02-19 DIAGNOSIS — I130 Hypertensive heart and chronic kidney disease with heart failure and stage 1 through stage 4 chronic kidney disease, or unspecified chronic kidney disease: Secondary | ICD-10-CM | POA: Diagnosis not present

## 2018-02-19 DIAGNOSIS — D631 Anemia in chronic kidney disease: Secondary | ICD-10-CM | POA: Diagnosis not present

## 2018-02-19 DIAGNOSIS — S81812D Laceration without foreign body, left lower leg, subsequent encounter: Secondary | ICD-10-CM | POA: Diagnosis not present

## 2018-02-19 DIAGNOSIS — I129 Hypertensive chronic kidney disease with stage 1 through stage 4 chronic kidney disease, or unspecified chronic kidney disease: Secondary | ICD-10-CM | POA: Diagnosis not present

## 2018-02-20 DIAGNOSIS — I5042 Chronic combined systolic (congestive) and diastolic (congestive) heart failure: Secondary | ICD-10-CM | POA: Diagnosis not present

## 2018-02-20 DIAGNOSIS — D631 Anemia in chronic kidney disease: Secondary | ICD-10-CM | POA: Diagnosis not present

## 2018-02-20 DIAGNOSIS — S81812D Laceration without foreign body, left lower leg, subsequent encounter: Secondary | ICD-10-CM | POA: Diagnosis not present

## 2018-02-20 DIAGNOSIS — N183 Chronic kidney disease, stage 3 (moderate): Secondary | ICD-10-CM | POA: Diagnosis not present

## 2018-02-20 DIAGNOSIS — I130 Hypertensive heart and chronic kidney disease with heart failure and stage 1 through stage 4 chronic kidney disease, or unspecified chronic kidney disease: Secondary | ICD-10-CM | POA: Diagnosis not present

## 2018-02-20 DIAGNOSIS — I739 Peripheral vascular disease, unspecified: Secondary | ICD-10-CM | POA: Diagnosis not present

## 2018-02-21 DIAGNOSIS — S81812D Laceration without foreign body, left lower leg, subsequent encounter: Secondary | ICD-10-CM | POA: Diagnosis not present

## 2018-02-21 DIAGNOSIS — I5042 Chronic combined systolic (congestive) and diastolic (congestive) heart failure: Secondary | ICD-10-CM | POA: Diagnosis not present

## 2018-02-21 DIAGNOSIS — N183 Chronic kidney disease, stage 3 (moderate): Secondary | ICD-10-CM | POA: Diagnosis not present

## 2018-02-21 DIAGNOSIS — I130 Hypertensive heart and chronic kidney disease with heart failure and stage 1 through stage 4 chronic kidney disease, or unspecified chronic kidney disease: Secondary | ICD-10-CM | POA: Diagnosis not present

## 2018-02-21 DIAGNOSIS — I739 Peripheral vascular disease, unspecified: Secondary | ICD-10-CM | POA: Diagnosis not present

## 2018-02-21 DIAGNOSIS — D631 Anemia in chronic kidney disease: Secondary | ICD-10-CM | POA: Diagnosis not present

## 2018-02-22 DIAGNOSIS — S81812D Laceration without foreign body, left lower leg, subsequent encounter: Secondary | ICD-10-CM | POA: Diagnosis not present

## 2018-02-22 DIAGNOSIS — N183 Chronic kidney disease, stage 3 (moderate): Secondary | ICD-10-CM | POA: Diagnosis not present

## 2018-02-22 DIAGNOSIS — I130 Hypertensive heart and chronic kidney disease with heart failure and stage 1 through stage 4 chronic kidney disease, or unspecified chronic kidney disease: Secondary | ICD-10-CM | POA: Diagnosis not present

## 2018-02-22 DIAGNOSIS — I5042 Chronic combined systolic (congestive) and diastolic (congestive) heart failure: Secondary | ICD-10-CM | POA: Diagnosis not present

## 2018-02-22 DIAGNOSIS — D631 Anemia in chronic kidney disease: Secondary | ICD-10-CM | POA: Diagnosis not present

## 2018-02-22 DIAGNOSIS — I739 Peripheral vascular disease, unspecified: Secondary | ICD-10-CM | POA: Diagnosis not present

## 2018-02-25 DIAGNOSIS — I739 Peripheral vascular disease, unspecified: Secondary | ICD-10-CM | POA: Diagnosis not present

## 2018-02-25 DIAGNOSIS — D631 Anemia in chronic kidney disease: Secondary | ICD-10-CM | POA: Diagnosis not present

## 2018-02-25 DIAGNOSIS — I130 Hypertensive heart and chronic kidney disease with heart failure and stage 1 through stage 4 chronic kidney disease, or unspecified chronic kidney disease: Secondary | ICD-10-CM | POA: Diagnosis not present

## 2018-02-25 DIAGNOSIS — N183 Chronic kidney disease, stage 3 (moderate): Secondary | ICD-10-CM | POA: Diagnosis not present

## 2018-02-25 DIAGNOSIS — I5042 Chronic combined systolic (congestive) and diastolic (congestive) heart failure: Secondary | ICD-10-CM | POA: Diagnosis not present

## 2018-02-25 DIAGNOSIS — S81812D Laceration without foreign body, left lower leg, subsequent encounter: Secondary | ICD-10-CM | POA: Diagnosis not present

## 2018-02-26 DIAGNOSIS — D631 Anemia in chronic kidney disease: Secondary | ICD-10-CM | POA: Diagnosis not present

## 2018-02-26 DIAGNOSIS — N183 Chronic kidney disease, stage 3 (moderate): Secondary | ICD-10-CM | POA: Diagnosis not present

## 2018-02-26 DIAGNOSIS — I739 Peripheral vascular disease, unspecified: Secondary | ICD-10-CM | POA: Diagnosis not present

## 2018-02-26 DIAGNOSIS — S81812D Laceration without foreign body, left lower leg, subsequent encounter: Secondary | ICD-10-CM | POA: Diagnosis not present

## 2018-02-26 DIAGNOSIS — I130 Hypertensive heart and chronic kidney disease with heart failure and stage 1 through stage 4 chronic kidney disease, or unspecified chronic kidney disease: Secondary | ICD-10-CM | POA: Diagnosis not present

## 2018-02-26 DIAGNOSIS — I129 Hypertensive chronic kidney disease with stage 1 through stage 4 chronic kidney disease, or unspecified chronic kidney disease: Secondary | ICD-10-CM | POA: Diagnosis not present

## 2018-02-26 DIAGNOSIS — I5042 Chronic combined systolic (congestive) and diastolic (congestive) heart failure: Secondary | ICD-10-CM | POA: Diagnosis not present

## 2018-02-27 DIAGNOSIS — D631 Anemia in chronic kidney disease: Secondary | ICD-10-CM | POA: Diagnosis not present

## 2018-02-27 DIAGNOSIS — S81812D Laceration without foreign body, left lower leg, subsequent encounter: Secondary | ICD-10-CM | POA: Diagnosis not present

## 2018-02-27 DIAGNOSIS — I130 Hypertensive heart and chronic kidney disease with heart failure and stage 1 through stage 4 chronic kidney disease, or unspecified chronic kidney disease: Secondary | ICD-10-CM | POA: Diagnosis not present

## 2018-02-27 DIAGNOSIS — I739 Peripheral vascular disease, unspecified: Secondary | ICD-10-CM | POA: Diagnosis not present

## 2018-02-27 DIAGNOSIS — N183 Chronic kidney disease, stage 3 (moderate): Secondary | ICD-10-CM | POA: Diagnosis not present

## 2018-02-27 DIAGNOSIS — I5042 Chronic combined systolic (congestive) and diastolic (congestive) heart failure: Secondary | ICD-10-CM | POA: Diagnosis not present

## 2018-02-28 DIAGNOSIS — D631 Anemia in chronic kidney disease: Secondary | ICD-10-CM | POA: Diagnosis not present

## 2018-02-28 DIAGNOSIS — I130 Hypertensive heart and chronic kidney disease with heart failure and stage 1 through stage 4 chronic kidney disease, or unspecified chronic kidney disease: Secondary | ICD-10-CM | POA: Diagnosis not present

## 2018-02-28 DIAGNOSIS — I739 Peripheral vascular disease, unspecified: Secondary | ICD-10-CM | POA: Diagnosis not present

## 2018-02-28 DIAGNOSIS — I5042 Chronic combined systolic (congestive) and diastolic (congestive) heart failure: Secondary | ICD-10-CM | POA: Diagnosis not present

## 2018-02-28 DIAGNOSIS — S81812D Laceration without foreign body, left lower leg, subsequent encounter: Secondary | ICD-10-CM | POA: Diagnosis not present

## 2018-02-28 DIAGNOSIS — N183 Chronic kidney disease, stage 3 (moderate): Secondary | ICD-10-CM | POA: Diagnosis not present

## 2018-03-01 DIAGNOSIS — I5042 Chronic combined systolic (congestive) and diastolic (congestive) heart failure: Secondary | ICD-10-CM | POA: Diagnosis not present

## 2018-03-01 DIAGNOSIS — S81812D Laceration without foreign body, left lower leg, subsequent encounter: Secondary | ICD-10-CM | POA: Diagnosis not present

## 2018-03-01 DIAGNOSIS — I130 Hypertensive heart and chronic kidney disease with heart failure and stage 1 through stage 4 chronic kidney disease, or unspecified chronic kidney disease: Secondary | ICD-10-CM | POA: Diagnosis not present

## 2018-03-01 DIAGNOSIS — D631 Anemia in chronic kidney disease: Secondary | ICD-10-CM | POA: Diagnosis not present

## 2018-03-01 DIAGNOSIS — N183 Chronic kidney disease, stage 3 (moderate): Secondary | ICD-10-CM | POA: Diagnosis not present

## 2018-03-01 DIAGNOSIS — I739 Peripheral vascular disease, unspecified: Secondary | ICD-10-CM | POA: Diagnosis not present

## 2018-03-04 DIAGNOSIS — I5042 Chronic combined systolic (congestive) and diastolic (congestive) heart failure: Secondary | ICD-10-CM | POA: Diagnosis not present

## 2018-03-04 DIAGNOSIS — S81812D Laceration without foreign body, left lower leg, subsequent encounter: Secondary | ICD-10-CM | POA: Diagnosis not present

## 2018-03-04 DIAGNOSIS — D631 Anemia in chronic kidney disease: Secondary | ICD-10-CM | POA: Diagnosis not present

## 2018-03-04 DIAGNOSIS — I130 Hypertensive heart and chronic kidney disease with heart failure and stage 1 through stage 4 chronic kidney disease, or unspecified chronic kidney disease: Secondary | ICD-10-CM | POA: Diagnosis not present

## 2018-03-04 DIAGNOSIS — I739 Peripheral vascular disease, unspecified: Secondary | ICD-10-CM | POA: Diagnosis not present

## 2018-03-04 DIAGNOSIS — N183 Chronic kidney disease, stage 3 (moderate): Secondary | ICD-10-CM | POA: Diagnosis not present

## 2018-03-05 DIAGNOSIS — N183 Chronic kidney disease, stage 3 (moderate): Secondary | ICD-10-CM | POA: Diagnosis not present

## 2018-03-05 DIAGNOSIS — I130 Hypertensive heart and chronic kidney disease with heart failure and stage 1 through stage 4 chronic kidney disease, or unspecified chronic kidney disease: Secondary | ICD-10-CM | POA: Diagnosis not present

## 2018-03-05 DIAGNOSIS — I5042 Chronic combined systolic (congestive) and diastolic (congestive) heart failure: Secondary | ICD-10-CM | POA: Diagnosis not present

## 2018-03-05 DIAGNOSIS — I739 Peripheral vascular disease, unspecified: Secondary | ICD-10-CM | POA: Diagnosis not present

## 2018-03-05 DIAGNOSIS — D631 Anemia in chronic kidney disease: Secondary | ICD-10-CM | POA: Diagnosis not present

## 2018-03-05 DIAGNOSIS — S81812D Laceration without foreign body, left lower leg, subsequent encounter: Secondary | ICD-10-CM | POA: Diagnosis not present

## 2018-03-06 DIAGNOSIS — I5042 Chronic combined systolic (congestive) and diastolic (congestive) heart failure: Secondary | ICD-10-CM | POA: Diagnosis not present

## 2018-03-06 DIAGNOSIS — D631 Anemia in chronic kidney disease: Secondary | ICD-10-CM | POA: Diagnosis not present

## 2018-03-06 DIAGNOSIS — I480 Paroxysmal atrial fibrillation: Secondary | ICD-10-CM | POA: Diagnosis not present

## 2018-03-06 DIAGNOSIS — I739 Peripheral vascular disease, unspecified: Secondary | ICD-10-CM | POA: Diagnosis not present

## 2018-03-06 DIAGNOSIS — S81812D Laceration without foreign body, left lower leg, subsequent encounter: Secondary | ICD-10-CM | POA: Diagnosis not present

## 2018-03-06 DIAGNOSIS — N183 Chronic kidney disease, stage 3 (moderate): Secondary | ICD-10-CM | POA: Diagnosis not present

## 2018-03-06 DIAGNOSIS — Z87891 Personal history of nicotine dependence: Secondary | ICD-10-CM | POA: Diagnosis not present

## 2018-03-06 DIAGNOSIS — I130 Hypertensive heart and chronic kidney disease with heart failure and stage 1 through stage 4 chronic kidney disease, or unspecified chronic kidney disease: Secondary | ICD-10-CM | POA: Diagnosis not present

## 2018-03-06 DIAGNOSIS — I2510 Atherosclerotic heart disease of native coronary artery without angina pectoris: Secondary | ICD-10-CM | POA: Diagnosis not present

## 2018-03-06 DIAGNOSIS — Z79899 Other long term (current) drug therapy: Secondary | ICD-10-CM | POA: Diagnosis not present

## 2018-03-06 DIAGNOSIS — Z7901 Long term (current) use of anticoagulants: Secondary | ICD-10-CM | POA: Diagnosis not present

## 2018-03-07 DIAGNOSIS — I739 Peripheral vascular disease, unspecified: Secondary | ICD-10-CM | POA: Diagnosis not present

## 2018-03-07 DIAGNOSIS — I5042 Chronic combined systolic (congestive) and diastolic (congestive) heart failure: Secondary | ICD-10-CM | POA: Diagnosis not present

## 2018-03-07 DIAGNOSIS — D631 Anemia in chronic kidney disease: Secondary | ICD-10-CM | POA: Diagnosis not present

## 2018-03-07 DIAGNOSIS — I130 Hypertensive heart and chronic kidney disease with heart failure and stage 1 through stage 4 chronic kidney disease, or unspecified chronic kidney disease: Secondary | ICD-10-CM | POA: Diagnosis not present

## 2018-03-07 DIAGNOSIS — S81812D Laceration without foreign body, left lower leg, subsequent encounter: Secondary | ICD-10-CM | POA: Diagnosis not present

## 2018-03-07 DIAGNOSIS — N183 Chronic kidney disease, stage 3 (moderate): Secondary | ICD-10-CM | POA: Diagnosis not present

## 2018-03-08 DIAGNOSIS — D631 Anemia in chronic kidney disease: Secondary | ICD-10-CM | POA: Diagnosis not present

## 2018-03-08 DIAGNOSIS — S81812D Laceration without foreign body, left lower leg, subsequent encounter: Secondary | ICD-10-CM | POA: Diagnosis not present

## 2018-03-08 DIAGNOSIS — I739 Peripheral vascular disease, unspecified: Secondary | ICD-10-CM | POA: Diagnosis not present

## 2018-03-08 DIAGNOSIS — I5042 Chronic combined systolic (congestive) and diastolic (congestive) heart failure: Secondary | ICD-10-CM | POA: Diagnosis not present

## 2018-03-08 DIAGNOSIS — I130 Hypertensive heart and chronic kidney disease with heart failure and stage 1 through stage 4 chronic kidney disease, or unspecified chronic kidney disease: Secondary | ICD-10-CM | POA: Diagnosis not present

## 2018-03-08 DIAGNOSIS — N183 Chronic kidney disease, stage 3 (moderate): Secondary | ICD-10-CM | POA: Diagnosis not present

## 2018-03-11 DIAGNOSIS — I739 Peripheral vascular disease, unspecified: Secondary | ICD-10-CM | POA: Diagnosis not present

## 2018-03-11 DIAGNOSIS — D631 Anemia in chronic kidney disease: Secondary | ICD-10-CM | POA: Diagnosis not present

## 2018-03-11 DIAGNOSIS — I5042 Chronic combined systolic (congestive) and diastolic (congestive) heart failure: Secondary | ICD-10-CM | POA: Diagnosis not present

## 2018-03-11 DIAGNOSIS — S81812D Laceration without foreign body, left lower leg, subsequent encounter: Secondary | ICD-10-CM | POA: Diagnosis not present

## 2018-03-11 DIAGNOSIS — I130 Hypertensive heart and chronic kidney disease with heart failure and stage 1 through stage 4 chronic kidney disease, or unspecified chronic kidney disease: Secondary | ICD-10-CM | POA: Diagnosis not present

## 2018-03-11 DIAGNOSIS — N183 Chronic kidney disease, stage 3 (moderate): Secondary | ICD-10-CM | POA: Diagnosis not present

## 2018-03-12 DIAGNOSIS — S81802A Unspecified open wound, left lower leg, initial encounter: Secondary | ICD-10-CM | POA: Diagnosis not present

## 2018-03-12 DIAGNOSIS — I890 Lymphedema, not elsewhere classified: Secondary | ICD-10-CM | POA: Diagnosis not present

## 2018-03-12 DIAGNOSIS — Z7901 Long term (current) use of anticoagulants: Secondary | ICD-10-CM | POA: Diagnosis not present

## 2018-03-12 DIAGNOSIS — I739 Peripheral vascular disease, unspecified: Secondary | ICD-10-CM | POA: Diagnosis not present

## 2018-03-12 DIAGNOSIS — I872 Venous insufficiency (chronic) (peripheral): Secondary | ICD-10-CM | POA: Diagnosis not present

## 2018-03-14 DIAGNOSIS — D631 Anemia in chronic kidney disease: Secondary | ICD-10-CM | POA: Diagnosis not present

## 2018-03-14 DIAGNOSIS — I739 Peripheral vascular disease, unspecified: Secondary | ICD-10-CM | POA: Diagnosis not present

## 2018-03-14 DIAGNOSIS — S81812D Laceration without foreign body, left lower leg, subsequent encounter: Secondary | ICD-10-CM | POA: Diagnosis not present

## 2018-03-14 DIAGNOSIS — I5042 Chronic combined systolic (congestive) and diastolic (congestive) heart failure: Secondary | ICD-10-CM | POA: Diagnosis not present

## 2018-03-14 DIAGNOSIS — N183 Chronic kidney disease, stage 3 (moderate): Secondary | ICD-10-CM | POA: Diagnosis not present

## 2018-03-14 DIAGNOSIS — I130 Hypertensive heart and chronic kidney disease with heart failure and stage 1 through stage 4 chronic kidney disease, or unspecified chronic kidney disease: Secondary | ICD-10-CM | POA: Diagnosis not present

## 2018-03-15 DIAGNOSIS — D631 Anemia in chronic kidney disease: Secondary | ICD-10-CM | POA: Diagnosis not present

## 2018-03-15 DIAGNOSIS — N183 Chronic kidney disease, stage 3 (moderate): Secondary | ICD-10-CM | POA: Diagnosis not present

## 2018-03-15 DIAGNOSIS — I5042 Chronic combined systolic (congestive) and diastolic (congestive) heart failure: Secondary | ICD-10-CM | POA: Diagnosis not present

## 2018-03-15 DIAGNOSIS — S81812D Laceration without foreign body, left lower leg, subsequent encounter: Secondary | ICD-10-CM | POA: Diagnosis not present

## 2018-03-15 DIAGNOSIS — I130 Hypertensive heart and chronic kidney disease with heart failure and stage 1 through stage 4 chronic kidney disease, or unspecified chronic kidney disease: Secondary | ICD-10-CM | POA: Diagnosis not present

## 2018-03-15 DIAGNOSIS — I739 Peripheral vascular disease, unspecified: Secondary | ICD-10-CM | POA: Diagnosis not present

## 2018-03-18 DIAGNOSIS — I739 Peripheral vascular disease, unspecified: Secondary | ICD-10-CM | POA: Diagnosis not present

## 2018-03-18 DIAGNOSIS — D631 Anemia in chronic kidney disease: Secondary | ICD-10-CM | POA: Diagnosis not present

## 2018-03-18 DIAGNOSIS — I130 Hypertensive heart and chronic kidney disease with heart failure and stage 1 through stage 4 chronic kidney disease, or unspecified chronic kidney disease: Secondary | ICD-10-CM | POA: Diagnosis not present

## 2018-03-18 DIAGNOSIS — N183 Chronic kidney disease, stage 3 (moderate): Secondary | ICD-10-CM | POA: Diagnosis not present

## 2018-03-18 DIAGNOSIS — S81812D Laceration without foreign body, left lower leg, subsequent encounter: Secondary | ICD-10-CM | POA: Diagnosis not present

## 2018-03-18 DIAGNOSIS — I5042 Chronic combined systolic (congestive) and diastolic (congestive) heart failure: Secondary | ICD-10-CM | POA: Diagnosis not present

## 2018-03-20 DIAGNOSIS — D631 Anemia in chronic kidney disease: Secondary | ICD-10-CM | POA: Diagnosis not present

## 2018-03-20 DIAGNOSIS — I5042 Chronic combined systolic (congestive) and diastolic (congestive) heart failure: Secondary | ICD-10-CM | POA: Diagnosis not present

## 2018-03-20 DIAGNOSIS — S81812D Laceration without foreign body, left lower leg, subsequent encounter: Secondary | ICD-10-CM | POA: Diagnosis not present

## 2018-03-20 DIAGNOSIS — I739 Peripheral vascular disease, unspecified: Secondary | ICD-10-CM | POA: Diagnosis not present

## 2018-03-20 DIAGNOSIS — N183 Chronic kidney disease, stage 3 (moderate): Secondary | ICD-10-CM | POA: Diagnosis not present

## 2018-03-20 DIAGNOSIS — I130 Hypertensive heart and chronic kidney disease with heart failure and stage 1 through stage 4 chronic kidney disease, or unspecified chronic kidney disease: Secondary | ICD-10-CM | POA: Diagnosis not present

## 2018-03-22 DIAGNOSIS — I5042 Chronic combined systolic (congestive) and diastolic (congestive) heart failure: Secondary | ICD-10-CM | POA: Diagnosis not present

## 2018-03-22 DIAGNOSIS — I130 Hypertensive heart and chronic kidney disease with heart failure and stage 1 through stage 4 chronic kidney disease, or unspecified chronic kidney disease: Secondary | ICD-10-CM | POA: Diagnosis not present

## 2018-03-22 DIAGNOSIS — I739 Peripheral vascular disease, unspecified: Secondary | ICD-10-CM | POA: Diagnosis not present

## 2018-03-22 DIAGNOSIS — D631 Anemia in chronic kidney disease: Secondary | ICD-10-CM | POA: Diagnosis not present

## 2018-03-22 DIAGNOSIS — N183 Chronic kidney disease, stage 3 (moderate): Secondary | ICD-10-CM | POA: Diagnosis not present

## 2018-03-22 DIAGNOSIS — S81812D Laceration without foreign body, left lower leg, subsequent encounter: Secondary | ICD-10-CM | POA: Diagnosis not present

## 2018-03-25 DIAGNOSIS — I130 Hypertensive heart and chronic kidney disease with heart failure and stage 1 through stage 4 chronic kidney disease, or unspecified chronic kidney disease: Secondary | ICD-10-CM | POA: Diagnosis not present

## 2018-03-25 DIAGNOSIS — I739 Peripheral vascular disease, unspecified: Secondary | ICD-10-CM | POA: Diagnosis not present

## 2018-03-25 DIAGNOSIS — D631 Anemia in chronic kidney disease: Secondary | ICD-10-CM | POA: Diagnosis not present

## 2018-03-25 DIAGNOSIS — S81812D Laceration without foreign body, left lower leg, subsequent encounter: Secondary | ICD-10-CM | POA: Diagnosis not present

## 2018-03-25 DIAGNOSIS — I5042 Chronic combined systolic (congestive) and diastolic (congestive) heart failure: Secondary | ICD-10-CM | POA: Diagnosis not present

## 2018-03-25 DIAGNOSIS — N183 Chronic kidney disease, stage 3 (moderate): Secondary | ICD-10-CM | POA: Diagnosis not present

## 2018-03-27 DIAGNOSIS — D631 Anemia in chronic kidney disease: Secondary | ICD-10-CM | POA: Diagnosis not present

## 2018-03-27 DIAGNOSIS — I739 Peripheral vascular disease, unspecified: Secondary | ICD-10-CM | POA: Diagnosis not present

## 2018-03-27 DIAGNOSIS — I5042 Chronic combined systolic (congestive) and diastolic (congestive) heart failure: Secondary | ICD-10-CM | POA: Diagnosis not present

## 2018-03-27 DIAGNOSIS — S81812D Laceration without foreign body, left lower leg, subsequent encounter: Secondary | ICD-10-CM | POA: Diagnosis not present

## 2018-03-27 DIAGNOSIS — N183 Chronic kidney disease, stage 3 (moderate): Secondary | ICD-10-CM | POA: Diagnosis not present

## 2018-03-27 DIAGNOSIS — I130 Hypertensive heart and chronic kidney disease with heart failure and stage 1 through stage 4 chronic kidney disease, or unspecified chronic kidney disease: Secondary | ICD-10-CM | POA: Diagnosis not present

## 2018-03-28 DIAGNOSIS — Y92009 Unspecified place in unspecified non-institutional (private) residence as the place of occurrence of the external cause: Secondary | ICD-10-CM | POA: Diagnosis not present

## 2018-03-28 DIAGNOSIS — Z7901 Long term (current) use of anticoagulants: Secondary | ICD-10-CM | POA: Diagnosis not present

## 2018-03-28 DIAGNOSIS — I739 Peripheral vascular disease, unspecified: Secondary | ICD-10-CM | POA: Diagnosis not present

## 2018-03-28 DIAGNOSIS — I890 Lymphedema, not elsewhere classified: Secondary | ICD-10-CM | POA: Diagnosis not present

## 2018-03-28 DIAGNOSIS — I872 Venous insufficiency (chronic) (peripheral): Secondary | ICD-10-CM | POA: Diagnosis not present

## 2018-03-28 DIAGNOSIS — S81802D Unspecified open wound, left lower leg, subsequent encounter: Secondary | ICD-10-CM | POA: Diagnosis not present

## 2018-03-28 DIAGNOSIS — W269XXD Contact with unspecified sharp object(s), subsequent encounter: Secondary | ICD-10-CM | POA: Diagnosis not present

## 2018-04-01 DIAGNOSIS — S81812D Laceration without foreign body, left lower leg, subsequent encounter: Secondary | ICD-10-CM | POA: Diagnosis not present

## 2018-04-01 DIAGNOSIS — I5042 Chronic combined systolic (congestive) and diastolic (congestive) heart failure: Secondary | ICD-10-CM | POA: Diagnosis not present

## 2018-04-01 DIAGNOSIS — N183 Chronic kidney disease, stage 3 (moderate): Secondary | ICD-10-CM | POA: Diagnosis not present

## 2018-04-01 DIAGNOSIS — I130 Hypertensive heart and chronic kidney disease with heart failure and stage 1 through stage 4 chronic kidney disease, or unspecified chronic kidney disease: Secondary | ICD-10-CM | POA: Diagnosis not present

## 2018-04-01 DIAGNOSIS — I739 Peripheral vascular disease, unspecified: Secondary | ICD-10-CM | POA: Diagnosis not present

## 2018-04-01 DIAGNOSIS — D631 Anemia in chronic kidney disease: Secondary | ICD-10-CM | POA: Diagnosis not present

## 2018-04-03 DIAGNOSIS — I130 Hypertensive heart and chronic kidney disease with heart failure and stage 1 through stage 4 chronic kidney disease, or unspecified chronic kidney disease: Secondary | ICD-10-CM | POA: Diagnosis not present

## 2018-04-03 DIAGNOSIS — D631 Anemia in chronic kidney disease: Secondary | ICD-10-CM | POA: Diagnosis not present

## 2018-04-03 DIAGNOSIS — N183 Chronic kidney disease, stage 3 (moderate): Secondary | ICD-10-CM | POA: Diagnosis not present

## 2018-04-03 DIAGNOSIS — I739 Peripheral vascular disease, unspecified: Secondary | ICD-10-CM | POA: Diagnosis not present

## 2018-04-03 DIAGNOSIS — I5042 Chronic combined systolic (congestive) and diastolic (congestive) heart failure: Secondary | ICD-10-CM | POA: Diagnosis not present

## 2018-04-03 DIAGNOSIS — S81812D Laceration without foreign body, left lower leg, subsequent encounter: Secondary | ICD-10-CM | POA: Diagnosis not present

## 2018-04-05 DIAGNOSIS — N183 Chronic kidney disease, stage 3 (moderate): Secondary | ICD-10-CM | POA: Diagnosis not present

## 2018-04-05 DIAGNOSIS — I5042 Chronic combined systolic (congestive) and diastolic (congestive) heart failure: Secondary | ICD-10-CM | POA: Diagnosis not present

## 2018-04-05 DIAGNOSIS — I739 Peripheral vascular disease, unspecified: Secondary | ICD-10-CM | POA: Diagnosis not present

## 2018-04-05 DIAGNOSIS — S81812D Laceration without foreign body, left lower leg, subsequent encounter: Secondary | ICD-10-CM | POA: Diagnosis not present

## 2018-04-05 DIAGNOSIS — I130 Hypertensive heart and chronic kidney disease with heart failure and stage 1 through stage 4 chronic kidney disease, or unspecified chronic kidney disease: Secondary | ICD-10-CM | POA: Diagnosis not present

## 2018-04-05 DIAGNOSIS — D631 Anemia in chronic kidney disease: Secondary | ICD-10-CM | POA: Diagnosis not present

## 2018-04-08 DIAGNOSIS — I130 Hypertensive heart and chronic kidney disease with heart failure and stage 1 through stage 4 chronic kidney disease, or unspecified chronic kidney disease: Secondary | ICD-10-CM | POA: Diagnosis not present

## 2018-04-08 DIAGNOSIS — I5042 Chronic combined systolic (congestive) and diastolic (congestive) heart failure: Secondary | ICD-10-CM | POA: Diagnosis not present

## 2018-04-08 DIAGNOSIS — I739 Peripheral vascular disease, unspecified: Secondary | ICD-10-CM | POA: Diagnosis not present

## 2018-04-08 DIAGNOSIS — N183 Chronic kidney disease, stage 3 (moderate): Secondary | ICD-10-CM | POA: Diagnosis not present

## 2018-04-08 DIAGNOSIS — S81812D Laceration without foreign body, left lower leg, subsequent encounter: Secondary | ICD-10-CM | POA: Diagnosis not present

## 2018-04-08 DIAGNOSIS — D631 Anemia in chronic kidney disease: Secondary | ICD-10-CM | POA: Diagnosis not present

## 2018-04-10 ENCOUNTER — Other Ambulatory Visit (HOSPITAL_COMMUNITY): Payer: Self-pay | Admitting: Internal Medicine

## 2018-04-10 DIAGNOSIS — D631 Anemia in chronic kidney disease: Secondary | ICD-10-CM | POA: Diagnosis not present

## 2018-04-10 DIAGNOSIS — I130 Hypertensive heart and chronic kidney disease with heart failure and stage 1 through stage 4 chronic kidney disease, or unspecified chronic kidney disease: Secondary | ICD-10-CM | POA: Diagnosis not present

## 2018-04-10 DIAGNOSIS — N183 Chronic kidney disease, stage 3 (moderate): Secondary | ICD-10-CM | POA: Diagnosis not present

## 2018-04-10 DIAGNOSIS — S81812D Laceration without foreign body, left lower leg, subsequent encounter: Secondary | ICD-10-CM | POA: Diagnosis not present

## 2018-04-10 DIAGNOSIS — I739 Peripheral vascular disease, unspecified: Secondary | ICD-10-CM | POA: Diagnosis not present

## 2018-04-10 DIAGNOSIS — I5042 Chronic combined systolic (congestive) and diastolic (congestive) heart failure: Secondary | ICD-10-CM | POA: Diagnosis not present

## 2018-04-11 DIAGNOSIS — I739 Peripheral vascular disease, unspecified: Secondary | ICD-10-CM | POA: Diagnosis not present

## 2018-04-11 DIAGNOSIS — S81802D Unspecified open wound, left lower leg, subsequent encounter: Secondary | ICD-10-CM | POA: Diagnosis not present

## 2018-04-11 DIAGNOSIS — I130 Hypertensive heart and chronic kidney disease with heart failure and stage 1 through stage 4 chronic kidney disease, or unspecified chronic kidney disease: Secondary | ICD-10-CM | POA: Diagnosis not present

## 2018-04-11 DIAGNOSIS — I872 Venous insufficiency (chronic) (peripheral): Secondary | ICD-10-CM | POA: Diagnosis not present

## 2018-04-11 DIAGNOSIS — Y92009 Unspecified place in unspecified non-institutional (private) residence as the place of occurrence of the external cause: Secondary | ICD-10-CM | POA: Diagnosis not present

## 2018-04-11 DIAGNOSIS — I890 Lymphedema, not elsewhere classified: Secondary | ICD-10-CM | POA: Diagnosis not present

## 2018-04-11 DIAGNOSIS — D631 Anemia in chronic kidney disease: Secondary | ICD-10-CM | POA: Diagnosis not present

## 2018-04-11 DIAGNOSIS — Z7901 Long term (current) use of anticoagulants: Secondary | ICD-10-CM | POA: Diagnosis not present

## 2018-04-11 DIAGNOSIS — I5042 Chronic combined systolic (congestive) and diastolic (congestive) heart failure: Secondary | ICD-10-CM | POA: Diagnosis not present

## 2018-04-11 DIAGNOSIS — N183 Chronic kidney disease, stage 3 (moderate): Secondary | ICD-10-CM | POA: Diagnosis not present

## 2018-04-11 DIAGNOSIS — W269XXD Contact with unspecified sharp object(s), subsequent encounter: Secondary | ICD-10-CM | POA: Diagnosis not present

## 2018-04-11 DIAGNOSIS — S81812D Laceration without foreign body, left lower leg, subsequent encounter: Secondary | ICD-10-CM | POA: Diagnosis not present

## 2018-04-15 DIAGNOSIS — Z79891 Long term (current) use of opiate analgesic: Secondary | ICD-10-CM | POA: Diagnosis not present

## 2018-04-15 DIAGNOSIS — I69354 Hemiplegia and hemiparesis following cerebral infarction affecting left non-dominant side: Secondary | ICD-10-CM | POA: Diagnosis not present

## 2018-04-15 DIAGNOSIS — Z9981 Dependence on supplemental oxygen: Secondary | ICD-10-CM | POA: Diagnosis not present

## 2018-04-15 DIAGNOSIS — N183 Chronic kidney disease, stage 3 (moderate): Secondary | ICD-10-CM | POA: Diagnosis not present

## 2018-04-15 DIAGNOSIS — S81812D Laceration without foreign body, left lower leg, subsequent encounter: Secondary | ICD-10-CM | POA: Diagnosis not present

## 2018-04-15 DIAGNOSIS — I482 Chronic atrial fibrillation, unspecified: Secondary | ICD-10-CM | POA: Diagnosis not present

## 2018-04-15 DIAGNOSIS — I5042 Chronic combined systolic (congestive) and diastolic (congestive) heart failure: Secondary | ICD-10-CM | POA: Diagnosis not present

## 2018-04-15 DIAGNOSIS — I130 Hypertensive heart and chronic kidney disease with heart failure and stage 1 through stage 4 chronic kidney disease, or unspecified chronic kidney disease: Secondary | ICD-10-CM | POA: Diagnosis not present

## 2018-04-15 DIAGNOSIS — I739 Peripheral vascular disease, unspecified: Secondary | ICD-10-CM | POA: Diagnosis not present

## 2018-04-15 DIAGNOSIS — D631 Anemia in chronic kidney disease: Secondary | ICD-10-CM | POA: Diagnosis not present

## 2018-04-15 DIAGNOSIS — I2510 Atherosclerotic heart disease of native coronary artery without angina pectoris: Secondary | ICD-10-CM | POA: Diagnosis not present

## 2018-04-15 DIAGNOSIS — J439 Emphysema, unspecified: Secondary | ICD-10-CM | POA: Diagnosis not present

## 2018-04-15 DIAGNOSIS — Z87891 Personal history of nicotine dependence: Secondary | ICD-10-CM | POA: Diagnosis not present

## 2018-04-15 DIAGNOSIS — F329 Major depressive disorder, single episode, unspecified: Secondary | ICD-10-CM | POA: Diagnosis not present

## 2018-04-15 DIAGNOSIS — Z7901 Long term (current) use of anticoagulants: Secondary | ICD-10-CM | POA: Diagnosis not present

## 2018-04-15 DIAGNOSIS — Z9181 History of falling: Secondary | ICD-10-CM | POA: Diagnosis not present

## 2018-04-19 DIAGNOSIS — N183 Chronic kidney disease, stage 3 (moderate): Secondary | ICD-10-CM | POA: Diagnosis not present

## 2018-04-19 DIAGNOSIS — I5042 Chronic combined systolic (congestive) and diastolic (congestive) heart failure: Secondary | ICD-10-CM | POA: Diagnosis not present

## 2018-04-19 DIAGNOSIS — S81812D Laceration without foreign body, left lower leg, subsequent encounter: Secondary | ICD-10-CM | POA: Diagnosis not present

## 2018-04-19 DIAGNOSIS — I739 Peripheral vascular disease, unspecified: Secondary | ICD-10-CM | POA: Diagnosis not present

## 2018-04-19 DIAGNOSIS — D631 Anemia in chronic kidney disease: Secondary | ICD-10-CM | POA: Diagnosis not present

## 2018-04-19 DIAGNOSIS — I130 Hypertensive heart and chronic kidney disease with heart failure and stage 1 through stage 4 chronic kidney disease, or unspecified chronic kidney disease: Secondary | ICD-10-CM | POA: Diagnosis not present

## 2018-04-23 DIAGNOSIS — I130 Hypertensive heart and chronic kidney disease with heart failure and stage 1 through stage 4 chronic kidney disease, or unspecified chronic kidney disease: Secondary | ICD-10-CM | POA: Diagnosis not present

## 2018-04-23 DIAGNOSIS — S81812D Laceration without foreign body, left lower leg, subsequent encounter: Secondary | ICD-10-CM | POA: Diagnosis not present

## 2018-04-23 DIAGNOSIS — I5042 Chronic combined systolic (congestive) and diastolic (congestive) heart failure: Secondary | ICD-10-CM | POA: Diagnosis not present

## 2018-04-23 DIAGNOSIS — N183 Chronic kidney disease, stage 3 (moderate): Secondary | ICD-10-CM | POA: Diagnosis not present

## 2018-04-23 DIAGNOSIS — I739 Peripheral vascular disease, unspecified: Secondary | ICD-10-CM | POA: Diagnosis not present

## 2018-04-23 DIAGNOSIS — D631 Anemia in chronic kidney disease: Secondary | ICD-10-CM | POA: Diagnosis not present

## 2018-04-26 DIAGNOSIS — I5042 Chronic combined systolic (congestive) and diastolic (congestive) heart failure: Secondary | ICD-10-CM | POA: Diagnosis not present

## 2018-04-26 DIAGNOSIS — S81812D Laceration without foreign body, left lower leg, subsequent encounter: Secondary | ICD-10-CM | POA: Diagnosis not present

## 2018-04-26 DIAGNOSIS — N183 Chronic kidney disease, stage 3 (moderate): Secondary | ICD-10-CM | POA: Diagnosis not present

## 2018-04-26 DIAGNOSIS — D631 Anemia in chronic kidney disease: Secondary | ICD-10-CM | POA: Diagnosis not present

## 2018-04-26 DIAGNOSIS — I130 Hypertensive heart and chronic kidney disease with heart failure and stage 1 through stage 4 chronic kidney disease, or unspecified chronic kidney disease: Secondary | ICD-10-CM | POA: Diagnosis not present

## 2018-04-26 DIAGNOSIS — I739 Peripheral vascular disease, unspecified: Secondary | ICD-10-CM | POA: Diagnosis not present

## 2018-04-29 DIAGNOSIS — S81812D Laceration without foreign body, left lower leg, subsequent encounter: Secondary | ICD-10-CM | POA: Diagnosis not present

## 2018-04-29 DIAGNOSIS — I130 Hypertensive heart and chronic kidney disease with heart failure and stage 1 through stage 4 chronic kidney disease, or unspecified chronic kidney disease: Secondary | ICD-10-CM | POA: Diagnosis not present

## 2018-04-29 DIAGNOSIS — N183 Chronic kidney disease, stage 3 (moderate): Secondary | ICD-10-CM | POA: Diagnosis not present

## 2018-04-29 DIAGNOSIS — I5042 Chronic combined systolic (congestive) and diastolic (congestive) heart failure: Secondary | ICD-10-CM | POA: Diagnosis not present

## 2018-04-29 DIAGNOSIS — I739 Peripheral vascular disease, unspecified: Secondary | ICD-10-CM | POA: Diagnosis not present

## 2018-04-29 DIAGNOSIS — D631 Anemia in chronic kidney disease: Secondary | ICD-10-CM | POA: Diagnosis not present

## 2018-05-01 DIAGNOSIS — I739 Peripheral vascular disease, unspecified: Secondary | ICD-10-CM | POA: Diagnosis not present

## 2018-05-01 DIAGNOSIS — D631 Anemia in chronic kidney disease: Secondary | ICD-10-CM | POA: Diagnosis not present

## 2018-05-01 DIAGNOSIS — I130 Hypertensive heart and chronic kidney disease with heart failure and stage 1 through stage 4 chronic kidney disease, or unspecified chronic kidney disease: Secondary | ICD-10-CM | POA: Diagnosis not present

## 2018-05-01 DIAGNOSIS — S81812D Laceration without foreign body, left lower leg, subsequent encounter: Secondary | ICD-10-CM | POA: Diagnosis not present

## 2018-05-01 DIAGNOSIS — N183 Chronic kidney disease, stage 3 (moderate): Secondary | ICD-10-CM | POA: Diagnosis not present

## 2018-05-01 DIAGNOSIS — I5042 Chronic combined systolic (congestive) and diastolic (congestive) heart failure: Secondary | ICD-10-CM | POA: Diagnosis not present

## 2018-05-03 DIAGNOSIS — I4811 Longstanding persistent atrial fibrillation: Secondary | ICD-10-CM | POA: Diagnosis not present

## 2018-05-03 DIAGNOSIS — I110 Hypertensive heart disease with heart failure: Secondary | ICD-10-CM | POA: Diagnosis not present

## 2018-05-03 DIAGNOSIS — Z87891 Personal history of nicotine dependence: Secondary | ICD-10-CM | POA: Diagnosis not present

## 2018-05-03 DIAGNOSIS — Z952 Presence of prosthetic heart valve: Secondary | ICD-10-CM | POA: Diagnosis not present

## 2018-05-03 DIAGNOSIS — I2510 Atherosclerotic heart disease of native coronary artery without angina pectoris: Secondary | ICD-10-CM | POA: Diagnosis not present

## 2018-05-03 DIAGNOSIS — I5042 Chronic combined systolic (congestive) and diastolic (congestive) heart failure: Secondary | ICD-10-CM | POA: Diagnosis not present

## 2018-05-08 DIAGNOSIS — G8194 Hemiplegia, unspecified affecting left nondominant side: Secondary | ICD-10-CM | POA: Diagnosis not present

## 2018-05-22 DIAGNOSIS — I129 Hypertensive chronic kidney disease with stage 1 through stage 4 chronic kidney disease, or unspecified chronic kidney disease: Secondary | ICD-10-CM | POA: Diagnosis not present

## 2018-05-22 DIAGNOSIS — N183 Chronic kidney disease, stage 3 (moderate): Secondary | ICD-10-CM | POA: Diagnosis not present

## 2018-05-28 DIAGNOSIS — Z1211 Encounter for screening for malignant neoplasm of colon: Secondary | ICD-10-CM | POA: Diagnosis not present

## 2018-05-28 DIAGNOSIS — Z86010 Personal history of colonic polyps: Secondary | ICD-10-CM | POA: Diagnosis not present

## 2018-05-28 DIAGNOSIS — I2510 Atherosclerotic heart disease of native coronary artery without angina pectoris: Secondary | ICD-10-CM | POA: Diagnosis not present

## 2018-05-28 DIAGNOSIS — J449 Chronic obstructive pulmonary disease, unspecified: Secondary | ICD-10-CM | POA: Diagnosis not present

## 2018-05-28 DIAGNOSIS — D128 Benign neoplasm of rectum: Secondary | ICD-10-CM | POA: Diagnosis not present

## 2018-05-28 DIAGNOSIS — D123 Benign neoplasm of transverse colon: Secondary | ICD-10-CM | POA: Diagnosis not present

## 2018-05-28 DIAGNOSIS — N183 Chronic kidney disease, stage 3 (moderate): Secondary | ICD-10-CM | POA: Diagnosis not present

## 2018-05-28 DIAGNOSIS — K5730 Diverticulosis of large intestine without perforation or abscess without bleeding: Secondary | ICD-10-CM | POA: Diagnosis not present

## 2018-05-28 DIAGNOSIS — Z79899 Other long term (current) drug therapy: Secondary | ICD-10-CM | POA: Diagnosis not present

## 2018-05-28 DIAGNOSIS — D124 Benign neoplasm of descending colon: Secondary | ICD-10-CM | POA: Diagnosis not present

## 2018-05-28 DIAGNOSIS — D125 Benign neoplasm of sigmoid colon: Secondary | ICD-10-CM | POA: Diagnosis not present

## 2018-05-28 DIAGNOSIS — I130 Hypertensive heart and chronic kidney disease with heart failure and stage 1 through stage 4 chronic kidney disease, or unspecified chronic kidney disease: Secondary | ICD-10-CM | POA: Diagnosis not present

## 2018-05-28 DIAGNOSIS — I110 Hypertensive heart disease with heart failure: Secondary | ICD-10-CM | POA: Diagnosis not present

## 2018-05-28 DIAGNOSIS — I5042 Chronic combined systolic (congestive) and diastolic (congestive) heart failure: Secondary | ICD-10-CM | POA: Diagnosis not present

## 2018-05-28 DIAGNOSIS — K644 Residual hemorrhoidal skin tags: Secondary | ICD-10-CM | POA: Diagnosis not present

## 2018-05-28 DIAGNOSIS — Z8673 Personal history of transient ischemic attack (TIA), and cerebral infarction without residual deficits: Secondary | ICD-10-CM | POA: Diagnosis not present

## 2018-05-29 DIAGNOSIS — E875 Hyperkalemia: Secondary | ICD-10-CM | POA: Diagnosis not present

## 2018-05-29 DIAGNOSIS — N183 Chronic kidney disease, stage 3 (moderate): Secondary | ICD-10-CM | POA: Diagnosis not present

## 2018-05-29 DIAGNOSIS — I129 Hypertensive chronic kidney disease with stage 1 through stage 4 chronic kidney disease, or unspecified chronic kidney disease: Secondary | ICD-10-CM | POA: Diagnosis not present

## 2018-06-25 DIAGNOSIS — G459 Transient cerebral ischemic attack, unspecified: Secondary | ICD-10-CM | POA: Diagnosis not present

## 2018-06-25 DIAGNOSIS — R51 Headache: Secondary | ICD-10-CM | POA: Diagnosis not present

## 2018-06-25 DIAGNOSIS — Z87891 Personal history of nicotine dependence: Secondary | ICD-10-CM | POA: Diagnosis not present

## 2018-06-25 DIAGNOSIS — R102 Pelvic and perineal pain: Secondary | ICD-10-CM | POA: Diagnosis not present

## 2018-06-25 DIAGNOSIS — R079 Chest pain, unspecified: Secondary | ICD-10-CM | POA: Diagnosis not present

## 2018-06-25 DIAGNOSIS — Z79899 Other long term (current) drug therapy: Secondary | ICD-10-CM | POA: Diagnosis not present

## 2018-06-25 DIAGNOSIS — M542 Cervicalgia: Secondary | ICD-10-CM | POA: Diagnosis not present

## 2018-06-25 DIAGNOSIS — S76011A Strain of muscle, fascia and tendon of right hip, initial encounter: Secondary | ICD-10-CM | POA: Diagnosis not present

## 2018-06-25 DIAGNOSIS — S0990XA Unspecified injury of head, initial encounter: Secondary | ICD-10-CM | POA: Diagnosis not present

## 2018-06-25 DIAGNOSIS — I10 Essential (primary) hypertension: Secondary | ICD-10-CM | POA: Diagnosis not present

## 2018-06-25 DIAGNOSIS — G8911 Acute pain due to trauma: Secondary | ICD-10-CM | POA: Diagnosis not present

## 2018-06-25 DIAGNOSIS — J449 Chronic obstructive pulmonary disease, unspecified: Secondary | ICD-10-CM | POA: Diagnosis not present

## 2018-06-25 DIAGNOSIS — M160 Bilateral primary osteoarthritis of hip: Secondary | ICD-10-CM | POA: Diagnosis not present

## 2018-06-25 DIAGNOSIS — E785 Hyperlipidemia, unspecified: Secondary | ICD-10-CM | POA: Diagnosis not present

## 2018-06-25 DIAGNOSIS — M25572 Pain in left ankle and joints of left foot: Secondary | ICD-10-CM | POA: Diagnosis not present

## 2018-06-25 DIAGNOSIS — R52 Pain, unspecified: Secondary | ICD-10-CM | POA: Diagnosis not present

## 2018-06-25 DIAGNOSIS — M25551 Pain in right hip: Secondary | ICD-10-CM | POA: Diagnosis not present

## 2018-06-25 DIAGNOSIS — S199XXA Unspecified injury of neck, initial encounter: Secondary | ICD-10-CM | POA: Diagnosis not present

## 2018-06-25 DIAGNOSIS — I4891 Unspecified atrial fibrillation: Secondary | ICD-10-CM | POA: Diagnosis not present

## 2018-06-25 DIAGNOSIS — Z7901 Long term (current) use of anticoagulants: Secondary | ICD-10-CM | POA: Diagnosis not present

## 2018-06-25 DIAGNOSIS — I482 Chronic atrial fibrillation, unspecified: Secondary | ICD-10-CM | POA: Diagnosis not present

## 2018-06-25 DIAGNOSIS — S299XXA Unspecified injury of thorax, initial encounter: Secondary | ICD-10-CM | POA: Diagnosis not present

## 2018-07-01 ENCOUNTER — Other Ambulatory Visit (HOSPITAL_COMMUNITY): Payer: Self-pay | Admitting: Internal Medicine

## 2018-07-03 ENCOUNTER — Other Ambulatory Visit (HOSPITAL_COMMUNITY): Payer: Self-pay | Admitting: Internal Medicine

## 2018-10-28 ENCOUNTER — Other Ambulatory Visit (HOSPITAL_COMMUNITY): Payer: Self-pay | Admitting: Internal Medicine

## 2018-11-23 ENCOUNTER — Other Ambulatory Visit (HOSPITAL_COMMUNITY): Payer: Self-pay | Admitting: Internal Medicine

## 2018-12-20 ENCOUNTER — Other Ambulatory Visit (HOSPITAL_COMMUNITY): Payer: Self-pay | Admitting: Internal Medicine

## 2019-01-15 ENCOUNTER — Other Ambulatory Visit (HOSPITAL_COMMUNITY): Payer: Self-pay | Admitting: Internal Medicine

## 2019-02-11 ENCOUNTER — Other Ambulatory Visit (HOSPITAL_COMMUNITY): Payer: Self-pay | Admitting: Internal Medicine

## 2019-02-12 ENCOUNTER — Other Ambulatory Visit (HOSPITAL_COMMUNITY): Payer: Self-pay

## 2019-02-19 ENCOUNTER — Other Ambulatory Visit (HOSPITAL_COMMUNITY): Payer: Self-pay

## 2019-02-19 MED ORDER — AMIODARONE HCL 200 MG PO TABS: 100.0000 mg | ORAL_TABLET | Freq: Every day | ORAL | 0 refills | Status: DC

## 2019-03-17 ENCOUNTER — Other Ambulatory Visit (HOSPITAL_COMMUNITY): Payer: Self-pay | Admitting: Internal Medicine

## 2019-04-12 ENCOUNTER — Other Ambulatory Visit (HOSPITAL_COMMUNITY): Payer: Self-pay | Admitting: Internal Medicine

## 2019-05-12 ENCOUNTER — Other Ambulatory Visit (HOSPITAL_COMMUNITY): Payer: Self-pay | Admitting: Internal Medicine

## 2019-06-07 ENCOUNTER — Other Ambulatory Visit (HOSPITAL_COMMUNITY): Payer: Self-pay | Admitting: Internal Medicine

## 2019-06-09 NOTE — Telephone Encounter (Signed)
Please contact patient for office visit Needs office visit for additional medication refills

## 2019-06-24 ENCOUNTER — Encounter (HOSPITAL_COMMUNITY): Payer: Medicare Other

## 2019-07-07 ENCOUNTER — Other Ambulatory Visit (HOSPITAL_COMMUNITY): Payer: Self-pay | Admitting: Internal Medicine

## 2019-09-10 ENCOUNTER — Other Ambulatory Visit (HOSPITAL_COMMUNITY): Payer: Self-pay | Admitting: Internal Medicine

## 2021-10-22 DEATH — deceased
